# Patient Record
Sex: Female | Born: 1975 | Race: Black or African American | Hispanic: No | Marital: Married | Smoking: Never smoker
Health system: Southern US, Community
[De-identification: ages and names within clinical notes are randomized; demographics above are authoritative.]

## PROBLEM LIST (undated history)

## (undated) DIAGNOSIS — M797 Fibromyalgia: Secondary | ICD-10-CM

## (undated) DIAGNOSIS — I10 Essential (primary) hypertension: Secondary | ICD-10-CM

## (undated) DIAGNOSIS — E282 Polycystic ovarian syndrome: Secondary | ICD-10-CM

## (undated) DIAGNOSIS — T7840XA Allergy, unspecified, initial encounter: Secondary | ICD-10-CM

## (undated) DIAGNOSIS — M1990 Unspecified osteoarthritis, unspecified site: Secondary | ICD-10-CM

## (undated) DIAGNOSIS — G4730 Sleep apnea, unspecified: Secondary | ICD-10-CM

## (undated) DIAGNOSIS — F32A Depression, unspecified: Secondary | ICD-10-CM

## (undated) DIAGNOSIS — G43909 Migraine, unspecified, not intractable, without status migrainosus: Secondary | ICD-10-CM

## (undated) DIAGNOSIS — J189 Pneumonia, unspecified organism: Secondary | ICD-10-CM

## (undated) DIAGNOSIS — R0600 Dyspnea, unspecified: Secondary | ICD-10-CM

## (undated) DIAGNOSIS — I509 Heart failure, unspecified: Secondary | ICD-10-CM

## (undated) DIAGNOSIS — F319 Bipolar disorder, unspecified: Secondary | ICD-10-CM

## (undated) DIAGNOSIS — J302 Other seasonal allergic rhinitis: Secondary | ICD-10-CM

## (undated) DIAGNOSIS — M2550 Pain in unspecified joint: Secondary | ICD-10-CM

## (undated) DIAGNOSIS — R0602 Shortness of breath: Secondary | ICD-10-CM

## (undated) DIAGNOSIS — F99 Mental disorder, not otherwise specified: Secondary | ICD-10-CM

## (undated) DIAGNOSIS — R519 Headache, unspecified: Secondary | ICD-10-CM

## (undated) DIAGNOSIS — R079 Chest pain, unspecified: Secondary | ICD-10-CM

## (undated) DIAGNOSIS — R600 Localized edema: Secondary | ICD-10-CM

## (undated) DIAGNOSIS — M459 Ankylosing spondylitis of unspecified sites in spine: Secondary | ICD-10-CM

## (undated) DIAGNOSIS — I5030 Unspecified diastolic (congestive) heart failure: Secondary | ICD-10-CM

## (undated) DIAGNOSIS — N979 Female infertility, unspecified: Secondary | ICD-10-CM

## (undated) DIAGNOSIS — M549 Dorsalgia, unspecified: Secondary | ICD-10-CM

## (undated) DIAGNOSIS — K589 Irritable bowel syndrome without diarrhea: Secondary | ICD-10-CM

## (undated) DIAGNOSIS — Z8719 Personal history of other diseases of the digestive system: Secondary | ICD-10-CM

## (undated) DIAGNOSIS — E559 Vitamin D deficiency, unspecified: Secondary | ICD-10-CM

## (undated) DIAGNOSIS — Z9289 Personal history of other medical treatment: Secondary | ICD-10-CM

## (undated) DIAGNOSIS — D259 Leiomyoma of uterus, unspecified: Secondary | ICD-10-CM

## (undated) DIAGNOSIS — F329 Major depressive disorder, single episode, unspecified: Secondary | ICD-10-CM

## (undated) DIAGNOSIS — R7303 Prediabetes: Secondary | ICD-10-CM

## (undated) DIAGNOSIS — E739 Lactose intolerance, unspecified: Secondary | ICD-10-CM

## (undated) DIAGNOSIS — D649 Anemia, unspecified: Secondary | ICD-10-CM

## (undated) DIAGNOSIS — F419 Anxiety disorder, unspecified: Secondary | ICD-10-CM

## (undated) DIAGNOSIS — D219 Benign neoplasm of connective and other soft tissue, unspecified: Secondary | ICD-10-CM

## (undated) DIAGNOSIS — R51 Headache: Secondary | ICD-10-CM

## (undated) HISTORY — DX: Heart failure, unspecified: I50.9

## (undated) HISTORY — DX: Dyspnea, unspecified: R06.00

## (undated) HISTORY — DX: Unspecified osteoarthritis, unspecified site: M19.90

## (undated) HISTORY — DX: Sleep apnea, unspecified: G47.30

## (undated) HISTORY — DX: Leiomyoma of uterus, unspecified: D25.9

## (undated) HISTORY — DX: Irritable bowel syndrome, unspecified: K58.9

## (undated) HISTORY — DX: Essential (primary) hypertension: I10

## (undated) HISTORY — DX: Pain in unspecified joint: M25.50

## (undated) HISTORY — DX: Female infertility, unspecified: N97.9

## (undated) HISTORY — DX: Dorsalgia, unspecified: M54.9

## (undated) HISTORY — DX: Morbid (severe) obesity due to excess calories: E66.01

## (undated) HISTORY — DX: Allergy, unspecified, initial encounter: T78.40XA

## (undated) HISTORY — DX: Fibromyalgia: M79.7

## (undated) HISTORY — DX: Migraine, unspecified, not intractable, without status migrainosus: G43.909

## (undated) HISTORY — DX: Vitamin D deficiency, unspecified: E55.9

## (undated) HISTORY — DX: Lactose intolerance, unspecified: E73.9

## (undated) HISTORY — DX: Unspecified diastolic (congestive) heart failure: I50.30

## (undated) HISTORY — DX: Shortness of breath: R06.02

## (undated) HISTORY — DX: Ankylosing spondylitis of unspecified sites in spine: M45.9

## (undated) HISTORY — DX: Chest pain, unspecified: R07.9

## (undated) HISTORY — DX: Polycystic ovarian syndrome: E28.2

## (undated) HISTORY — DX: Bipolar disorder, unspecified: F31.9

## (undated) HISTORY — DX: Prediabetes: R73.03

## (undated) HISTORY — DX: Localized edema: R60.0

---

## 1999-08-06 HISTORY — PX: DIAGNOSTIC LAPAROSCOPY: SUR761

## 1999-08-06 HISTORY — PX: OTHER SURGICAL HISTORY: SHX169

## 2002-11-02 ENCOUNTER — Encounter: Payer: Self-pay | Admitting: Obstetrics and Gynecology

## 2002-11-02 ENCOUNTER — Ambulatory Visit (HOSPITAL_COMMUNITY): Admission: RE | Admit: 2002-11-02 | Discharge: 2002-11-02 | Payer: Self-pay | Admitting: Obstetrics and Gynecology

## 2004-02-22 ENCOUNTER — Other Ambulatory Visit: Admission: RE | Admit: 2004-02-22 | Discharge: 2004-02-22 | Payer: Self-pay | Admitting: Gynecology

## 2004-05-05 HISTORY — PX: DILATION AND CURETTAGE OF UTERUS: SHX78

## 2004-12-13 ENCOUNTER — Other Ambulatory Visit: Admission: RE | Admit: 2004-12-13 | Discharge: 2004-12-13 | Payer: Self-pay | Admitting: Gynecology

## 2005-12-11 ENCOUNTER — Other Ambulatory Visit: Admission: RE | Admit: 2005-12-11 | Discharge: 2005-12-11 | Payer: Self-pay | Admitting: Gynecology

## 2006-04-11 ENCOUNTER — Encounter: Admission: RE | Admit: 2006-04-11 | Discharge: 2006-04-11 | Payer: Self-pay | Admitting: Allergy and Immunology

## 2006-08-05 HISTORY — PX: COLONOSCOPY: SHX174

## 2010-01-20 ENCOUNTER — Emergency Department (HOSPITAL_COMMUNITY): Admission: EM | Admit: 2010-01-20 | Discharge: 2010-01-20 | Payer: Self-pay | Admitting: Emergency Medicine

## 2010-08-26 ENCOUNTER — Encounter: Payer: Self-pay | Admitting: Rheumatology

## 2010-10-21 LAB — CBC
HCT: 29.6 % — ABNORMAL LOW (ref 36.0–46.0)
Hemoglobin: 9.4 g/dL — ABNORMAL LOW (ref 12.0–15.0)
MCHC: 31.8 g/dL (ref 30.0–36.0)
MCV: 68.1 fL — ABNORMAL LOW (ref 78.0–100.0)
Platelets: 304 10*3/uL (ref 150–400)
RBC: 4.35 MIL/uL (ref 3.87–5.11)
RDW: 20.2 % — ABNORMAL HIGH (ref 11.5–15.5)
WBC: 6.8 10*3/uL (ref 4.0–10.5)

## 2010-10-21 LAB — URINALYSIS, ROUTINE W REFLEX MICROSCOPIC
Bilirubin Urine: NEGATIVE
Glucose, UA: NEGATIVE mg/dL
Hgb urine dipstick: NEGATIVE
Ketones, ur: NEGATIVE mg/dL
Nitrite: NEGATIVE
Protein, ur: NEGATIVE mg/dL
Specific Gravity, Urine: 1.024 (ref 1.005–1.030)
Urobilinogen, UA: 1 mg/dL (ref 0.0–1.0)
pH: 6.5 (ref 5.0–8.0)

## 2010-10-21 LAB — BASIC METABOLIC PANEL
BUN: 10 mg/dL (ref 6–23)
CO2: 21 mEq/L (ref 19–32)
Calcium: 8.7 mg/dL (ref 8.4–10.5)
Chloride: 109 mEq/L (ref 96–112)
Creatinine, Ser: 0.8 mg/dL (ref 0.4–1.2)
GFR calc Af Amer: >60 mL/min (ref >60)
GFR calc non Af Amer: >60 mL/min (ref >60)
Glucose, Bld: 91 mg/dL (ref 70–99)
Potassium: 3.7 mEq/L (ref 3.5–5.1)
Sodium: 136 mEq/L (ref 135–145)

## 2010-10-21 LAB — PREGNANCY, URINE: Preg Test, Ur: NEGATIVE

## 2011-07-12 ENCOUNTER — Encounter (HOSPITAL_COMMUNITY): Payer: Self-pay | Admitting: Pharmacist

## 2011-07-15 NOTE — Patient Instructions (Addendum)
   Your procedure is scheduled on: Monday, Dec 17th  Enter through the [employer] of [facility] at: 12 Noon Pick up the phone at the desk and dial [phone] and inform us of your arrival.  Please call this number if you have any problems the morning of surgery: [phone]  Remember: Do not eat food after midnight: Sunday Do not drink clear liquids after:  9:30am Monday Take these medicines the morning of surgery with a SIP OF WATER:Abilify  Do not wear jewelry, make-up, or FINGER nail polish Do not wear lotions, powders, perfumes or deodorant. Do not shave 48 hours prior to surgery. Do not bring valuables to the hospital.  Patients discharged on the day of surgery will not be allowed to drive home.    Remember to use your hibiclens as instructed.Please shower with 1/2 bottle the evening before your surgery and the other 1/2 bottle the morning of surgery.

## 2011-07-17 ENCOUNTER — Encounter (HOSPITAL_COMMUNITY): Payer: Self-pay

## 2011-07-17 ENCOUNTER — Encounter (HOSPITAL_COMMUNITY)
Admission: RE | Admit: 2011-07-17 | Discharge: 2011-07-17 | Disposition: A | Discharge status: Home / Self Care | Payer: BC Managed Care – PPO | Source: Ambulatory Visit | Attending: Obstetrics and Gynecology | Admitting: Obstetrics and Gynecology

## 2011-07-17 HISTORY — DX: Anxiety disorder, unspecified: F41.9

## 2011-07-17 HISTORY — DX: Mental disorder, not otherwise specified: F99

## 2011-07-17 HISTORY — DX: Major depressive disorder, single episode, unspecified: F32.9

## 2011-07-17 HISTORY — DX: Depression, unspecified: F32.A

## 2011-07-17 HISTORY — DX: Anemia, unspecified: D64.9

## 2011-07-17 LAB — CBC
HCT: 28.9 % — ABNORMAL LOW (ref 36.0–46.0)
Hemoglobin: 8.5 g/dL — ABNORMAL LOW (ref 12.0–15.0)
MCH: 21.3 pg — ABNORMAL LOW (ref 26.0–34.0)
MCHC: 29.4 g/dL — ABNORMAL LOW (ref 30.0–36.0)
MCV: 72.4 fL — ABNORMAL LOW (ref 78.0–100.0)
Platelets: 371 10*3/uL (ref 150–400)
RBC: 3.99 MIL/uL (ref 3.87–5.11)
RDW: 18.4 % — ABNORMAL HIGH (ref 11.5–15.5)
WBC: 8.7 10*3/uL (ref 4.0–10.5)

## 2011-07-17 NOTE — Pre-Procedure Instructions (Signed)
Patient states her most recent hgb was 8.5, which is same result today in [facility] lab at PAT appt.

## 2011-07-21 MED ORDER — GENTAMICIN SULFATE 40 MG/ML IJ SOLN
INTRAMUSCULAR | Status: AC
  Administered 2011-07-22: 100 mL via INTRAVENOUS
  Filled 2011-07-21: qty 2.5

## 2011-07-22 ENCOUNTER — Other Ambulatory Visit: Payer: Self-pay | Admitting: Obstetrics and Gynecology

## 2011-07-22 ENCOUNTER — Encounter (HOSPITAL_COMMUNITY): Payer: Self-pay | Admitting: Anesthesiology

## 2011-07-22 ENCOUNTER — Encounter (HOSPITAL_COMMUNITY)
Admission: RE | Disposition: A | Discharge status: Home / Self Care | Payer: Self-pay | Source: Ambulatory Visit | Attending: Obstetrics and Gynecology

## 2011-07-22 ENCOUNTER — Ambulatory Visit (HOSPITAL_COMMUNITY)
Admission: RE | Admit: 2011-07-22 | Discharge: 2011-07-22 | Disposition: A | Discharge status: Home / Self Care | Payer: BC Managed Care – PPO | Source: Ambulatory Visit | Attending: Obstetrics and Gynecology | Admitting: Obstetrics and Gynecology

## 2011-07-22 ENCOUNTER — Ambulatory Visit (HOSPITAL_COMMUNITY): Payer: BC Managed Care – PPO | Admitting: Anesthesiology

## 2011-07-22 DIAGNOSIS — Z01818 Encounter for other preprocedural examination: Secondary | ICD-10-CM | POA: Insufficient documentation

## 2011-07-22 DIAGNOSIS — Z01812 Encounter for preprocedural laboratory examination: Secondary | ICD-10-CM | POA: Insufficient documentation

## 2011-07-22 DIAGNOSIS — N920 Excessive and frequent menstruation with regular cycle: Secondary | ICD-10-CM | POA: Insufficient documentation

## 2011-07-22 DIAGNOSIS — N840 Polyp of corpus uteri: Secondary | ICD-10-CM

## 2011-07-22 HISTORY — PX: HYSTEROSCOPY WITH D & C: SHX1775

## 2011-07-22 LAB — PREGNANCY, URINE: Preg Test, Ur: NEGATIVE

## 2011-07-22 SURGERY — DILATATION AND CURETTAGE /HYSTEROSCOPY
Anesthesia: Monitor Anesthesia Care | Site: Uterus | Wound class: Clean Contaminated

## 2011-07-22 MED ORDER — MIDAZOLAM HCL 5 MG/5ML IJ SOLN
INTRAMUSCULAR | Status: DC | PRN
  Administered 2011-07-22: 2 mg via INTRAVENOUS

## 2011-07-22 MED ORDER — LACTATED RINGERS IV SOLN
INTRAVENOUS | Status: DC
  Administered 2011-07-22 (×2): via INTRAVENOUS

## 2011-07-22 MED ORDER — MIDAZOLAM HCL 2 MG/2ML IJ SOLN
INTRAMUSCULAR | Status: AC
  Filled 2011-07-22: qty 2

## 2011-07-22 MED ORDER — FENTANYL CITRATE 0.05 MG/ML IJ SOLN
25.0000 ug | INTRAMUSCULAR | Status: DC | PRN
  Administered 2011-07-22: 50 ug via INTRAVENOUS

## 2011-07-22 MED ORDER — PROPOFOL 10 MG/ML IV EMUL
INTRAVENOUS | Status: AC
  Filled 2011-07-22: qty 20

## 2011-07-22 MED ORDER — ONDANSETRON HCL 4 MG/2ML IJ SOLN
INTRAMUSCULAR | Status: AC
  Filled 2011-07-22: qty 2

## 2011-07-22 MED ORDER — LIDOCAINE HCL 1 % IJ SOLN
INTRAMUSCULAR | Status: DC | PRN
  Administered 2011-07-22: 10 mL

## 2011-07-22 MED ORDER — KETOROLAC TROMETHAMINE 60 MG/2ML IM SOLN
INTRAMUSCULAR | Status: DC | PRN
  Administered 2011-07-22: 30 mg via INTRAMUSCULAR

## 2011-07-22 MED ORDER — KETOROLAC TROMETHAMINE 30 MG/ML IJ SOLN: 15.0000 mg | Freq: Once | INTRAMUSCULAR | Status: DC | PRN

## 2011-07-22 MED ORDER — ONDANSETRON HCL 4 MG/2ML IJ SOLN
INTRAMUSCULAR | Status: DC | PRN
  Administered 2011-07-22: 4 mg via INTRAVENOUS

## 2011-07-22 MED ORDER — LIDOCAINE HCL (CARDIAC) 20 MG/ML IV SOLN
INTRAVENOUS | Status: DC | PRN
  Administered 2011-07-22: 50 mg via INTRAVENOUS

## 2011-07-22 MED ORDER — LIDOCAINE HCL (CARDIAC) 20 MG/ML IV SOLN
INTRAVENOUS | Status: AC
  Filled 2011-07-22: qty 5

## 2011-07-22 MED ORDER — FENTANYL CITRATE 0.05 MG/ML IJ SOLN
INTRAMUSCULAR | Status: DC | PRN
  Administered 2011-07-22: 100 ug via INTRAVENOUS

## 2011-07-22 MED ORDER — PROPOFOL 10 MG/ML IV EMUL
INTRAVENOUS | Status: DC | PRN
  Administered 2011-07-22 (×9): 40 mg via INTRAVENOUS

## 2011-07-22 MED ORDER — FENTANYL CITRATE 0.05 MG/ML IJ SOLN
INTRAMUSCULAR | Status: AC
  Filled 2011-07-22: qty 2

## 2011-07-22 MED ORDER — FENTANYL CITRATE 0.05 MG/ML IJ SOLN
INTRAMUSCULAR | Status: AC
  Administered 2011-07-22: 50 ug via INTRAVENOUS
  Filled 2011-07-22: qty 2

## 2011-07-22 SURGICAL SUPPLY — 13 items
CANISTER SUCTION 2500CC (MISCELLANEOUS) ×2 IMPLANT
CATH ROBINSON RED A/P 16FR (CATHETERS) ×2 IMPLANT
CLOTH BEACON ORANGE TIMEOUT ST (SAFETY) ×2 IMPLANT
CONTAINER PREFILL 10% NBF 60ML (FORM) ×4 IMPLANT
ELECT REM PT RETURN 9FT ADLT (ELECTROSURGICAL)
ELECTRODE REM PT RTRN 9FT ADLT (ELECTROSURGICAL) IMPLANT
GLOVE BIO SURGEON STRL SZ 6.5 (GLOVE) ×4 IMPLANT
GOWN PREVENTION PLUS LG XLONG (DISPOSABLE) ×2 IMPLANT
GOWN STRL REIN XL XLG (GOWN DISPOSABLE) ×2 IMPLANT
LOOP ANGLED CUTTING 22FR (CUTTING LOOP) IMPLANT
PACK HYSTEROSCOPY LF (CUSTOM PROCEDURE TRAY) ×2 IMPLANT
TOWEL OR 17X24 6PK STRL BLUE (TOWEL DISPOSABLE) ×4 IMPLANT
WATER STERILE IRR 1000ML POUR (IV SOLUTION) ×2 IMPLANT

## 2011-07-22 NOTE — Progress Notes (Signed)
History and physical on chart. No significant changes Will proceed with D and C and Hysteroscopy Risks already discussed with the patient.

## 2011-07-22 NOTE — Anesthesia Postprocedure Evaluation (Signed)
  Anesthesia Post-op Note  Patient: Christine [name]  Procedure(s) Performed:  DILATATION AND CURETTAGE /HYSTEROSCOPY  Patient is awake and responsive. Pain and nausea are reasonably well controlled. Vital signs are stable and clinically acceptable. Oxygen saturation is clinically acceptable. There are no apparent anesthetic complications at this time. Patient is ready for discharge.

## 2011-07-22 NOTE — Brief Op Note (Signed)
07/22/2011  2:18 PM  PATIENT:  Christine [name]  35 y.o. female  PRE-OPERATIVE DIAGNOSIS:  endometrial polyp  POST-OPERATIVE DIAGNOSIS:  same  PROCEDURE:  Procedure(s): DILATATION AND CURETTAGE /HYSTEROSCOPY Resection of Endometrial polyp  SURGEON:  Surgeon(s): Jeani Hawking, MD  PHYSICIAN ASSISTANT:   ASSISTANTS: none   ANESTHESIA:   IV sedation and paracervical block  EBL:     BLOOD ADMINISTERED:none  DRAINS: none   LOCAL MEDICATIONS USED:  LIDOCAINE 10 CC  SPECIMEN:  Source of Specimen:  uterine currettings  DISPOSITION OF SPECIMEN:  PATHOLOGY  COUNTS:  YES  TOURNIQUET:  * No tourniquets in log *  DICTATION: .Other Dictation: Dictation Number [id number]  PLAN OF CARE: Discharge to home after PACU  PATIENT DISPOSITION:  PACU - hemodynamically stable.   Delay start of Pharmacological VTE agent (>24hrs) due to surgical blood loss or risk of bleeding:  {YES/NO/NOT APPLICABLE:20182

## 2011-07-22 NOTE — Anesthesia Preprocedure Evaluation (Signed)
Anesthesia Evaluation  Patient identified by MRN, date of birth, ID band Patient awake    Reviewed: Allergy & Precautions, H&P , NPO status , Patient's Chart, lab work & pertinent test results, reviewed documented beta blocker date and time   History of Anesthesia Complications Negative for: history of anesthetic complications  Airway Mallampati: II TM Distance: >3 FB Neck ROM: full    Dental  (+) Teeth Intact   Pulmonary neg pulmonary ROS,  clear to auscultation  Pulmonary exam normal       Cardiovascular Exercise Tolerance: Good neg cardio ROS regular Normal    Neuro/Psych PSYCHIATRIC DISORDERS (bipolar disorder, anxiety) Negative Neurological ROS     GI/Hepatic negative GI ROS, Neg liver ROS,   Endo/Other  Morbid obesity  Renal/GU negative Renal ROS  Genitourinary negative   Musculoskeletal   Abdominal   Peds  Hematology  (+) Blood dyscrasia (hgb 8.5 today - relates to menstrual blood loss), anemia ,   Anesthesia Other Findings   Reproductive/Obstetrics negative OB ROS                           Anesthesia Physical Anesthesia Plan  ASA: III  Anesthesia Plan: MAC   Post-op Pain Management:    Induction:   Airway Management Planned:   Additional Equipment:   Intra-op Plan:   Post-operative Plan:   Informed Consent: I have reviewed the patients History and Physical, chart, labs and discussed the procedure including the risks, benefits and alternatives for the proposed anesthesia with the patient or authorized representative who has indicated his/her understanding and acceptance.   Dental Advisory Given  Plan Discussed with: CRNA and Surgeon  Anesthesia Plan Comments:         Anesthesia Quick Evaluation

## 2011-07-22 NOTE — H&P (Signed)
35 year old female with endometrial polyp and fibroids. This patient has been experiencing very heavy periods. A sonohysterogram in the office revealed a small endometrial polyp.  Medical history  Unremarkable  Surgical history  Unremarkable  Medications  None  Allergies PENICILLIN  Review of systems as noted above  Family history  Non contributory  Afebrile Vital signs stable General alert and oriented Lung CTAB Car RRR Abdomen is soft and non tender Ext no edema  IMPRESSION: Endometrial polyp Menorrhagia  PLAN: D and C , Hysteroscopy Risks discussed with the patient

## 2011-07-22 NOTE — Transfer of Care (Signed)
Immediate Anesthesia Transfer of Care Note  Patient: Christine [name]  Procedure(s) Performed:  DILATATION AND CURETTAGE /HYSTEROSCOPY  Patient Location: PACU  Anesthesia Type: MAC combined with regional for post-op pain  Level of Consciousness: awake, alert  and oriented  Airway & Oxygen Therapy: Patient Spontanous Breathing and Patient connected to nasal cannula oxygen  Post-op Assessment: Report given to PACU RN and Post -op Vital signs reviewed and stable  Post vital signs: Reviewed and stable  Complications: No apparent anesthesia complications

## 2011-07-23 ENCOUNTER — Encounter (HOSPITAL_COMMUNITY): Payer: Self-pay | Admitting: Obstetrics and Gynecology

## 2011-07-23 NOTE — Op Note (Signed)
Christine [name], Christine [name]             ACCOUNT NO.:  [id number]  MEDICAL RECORD NO.:  [id number]  LOCATION:  [facility]                          FACILITY:  [facility]  PHYSICIAN:  Sante Biedermann L. Knoxx Boeding, M.D.DATE OF BIRTH:  Dec 19, 1975  DATE OF PROCEDURE:  07/22/2011 DATE OF DISCHARGE:  07/22/2011                              OPERATIVE REPORT   PREOPERATIVE DIAGNOSIS:  Endometrial polyp.  POSTOPERATIVE DIAGNOSIS:  Endometrial polyp.  PROCEDURES:  D and C, hysteroscopy, and resection of endometrial polyp.  SURGEON:  Cottrell Gentles L. Loisann Roach, MD  ANESTHESIA:  IV sedation with paracervical block.  EBL:  Minimal.  Source is specimen, uterine curettings with endometrial polyp sent to pathology.  COMPLICATIONS:  None.  PROCEDURE:  The patient was taken to the operating room, sedation was performed.  She was prepped and draped in the usual sterile fashion. Speculum was inserted into the vagina.  The cervix was grasped with a tenaculum.  Paracervical block was performed in standard fashion. Hysteroscope was inserted into the uterine cavity with excellent visualization.  We saw several small polypoid areas.  The hysteroscope was removed and the sharp curette was inserted.  The uterus was thoroughly curetted of all tissue.  Polyp forceps were inserted and the remainder of the tissue was removed.  I could identify the endometrial polyp and the tissue specimen.  The hysteroscope was reinserted and the uterine cavity was cleaned.  All instruments were removed from the vagina.  All sponge, lap, and instrument counts were correct x2.  The patient went to recovery room in stable condition.     Lynnwood Beckford L. Vincente Poli, M.D.     Florestine Avers  D:  07/22/2011  T:  07/23/2011  Job:  [id number]

## 2012-06-16 ENCOUNTER — Ambulatory Visit
Admission: RE | Admit: 2012-06-16 | Discharge: 2012-06-16 | Disposition: A | Discharge status: Home / Self Care | Payer: BC Managed Care – PPO | Source: Ambulatory Visit | Attending: Physician Assistant | Admitting: Physician Assistant

## 2012-06-16 ENCOUNTER — Other Ambulatory Visit: Payer: Self-pay | Admitting: Physician Assistant

## 2012-06-16 DIAGNOSIS — R109 Unspecified abdominal pain: Secondary | ICD-10-CM

## 2012-06-16 MED ORDER — IOHEXOL 300 MG/ML  SOLN
125.0000 mL | Freq: Once | INTRAMUSCULAR | Status: AC | PRN
  Administered 2012-06-16: 125 mL via INTRAVENOUS

## 2012-06-16 MED ORDER — IOHEXOL 300 MG/ML  SOLN
40.0000 mL | Freq: Once | INTRAMUSCULAR | Status: AC | PRN
  Administered 2012-06-16: 40 mL via ORAL

## 2014-01-15 ENCOUNTER — Encounter (HOSPITAL_COMMUNITY): Payer: Self-pay | Admitting: Emergency Medicine

## 2014-01-15 ENCOUNTER — Emergency Department (HOSPITAL_COMMUNITY)
Admission: EM | Admit: 2014-01-15 | Discharge: 2014-01-15 | Disposition: A | Discharge status: Home / Self Care | Payer: BC Managed Care – PPO | Attending: Emergency Medicine | Admitting: Emergency Medicine

## 2014-01-15 DIAGNOSIS — Z862 Personal history of diseases of the blood and blood-forming organs and certain disorders involving the immune mechanism: Secondary | ICD-10-CM | POA: Insufficient documentation

## 2014-01-15 DIAGNOSIS — Z3202 Encounter for pregnancy test, result negative: Secondary | ICD-10-CM | POA: Insufficient documentation

## 2014-01-15 DIAGNOSIS — E669 Obesity, unspecified: Secondary | ICD-10-CM | POA: Insufficient documentation

## 2014-01-15 DIAGNOSIS — N949 Unspecified condition associated with female genital organs and menstrual cycle: Secondary | ICD-10-CM | POA: Insufficient documentation

## 2014-01-15 DIAGNOSIS — F411 Generalized anxiety disorder: Secondary | ICD-10-CM | POA: Insufficient documentation

## 2014-01-15 DIAGNOSIS — R109 Unspecified abdominal pain: Secondary | ICD-10-CM | POA: Insufficient documentation

## 2014-01-15 DIAGNOSIS — Z880 Allergy status to penicillin: Secondary | ICD-10-CM | POA: Insufficient documentation

## 2014-01-15 DIAGNOSIS — N938 Other specified abnormal uterine and vaginal bleeding: Secondary | ICD-10-CM

## 2014-01-15 DIAGNOSIS — F329 Major depressive disorder, single episode, unspecified: Secondary | ICD-10-CM | POA: Insufficient documentation

## 2014-01-15 DIAGNOSIS — Z8742 Personal history of other diseases of the female genital tract: Secondary | ICD-10-CM | POA: Insufficient documentation

## 2014-01-15 DIAGNOSIS — Z791 Long term (current) use of non-steroidal anti-inflammatories (NSAID): Secondary | ICD-10-CM | POA: Insufficient documentation

## 2014-01-15 DIAGNOSIS — Z79899 Other long term (current) drug therapy: Secondary | ICD-10-CM | POA: Insufficient documentation

## 2014-01-15 DIAGNOSIS — F3289 Other specified depressive episodes: Secondary | ICD-10-CM | POA: Insufficient documentation

## 2014-01-15 DIAGNOSIS — N925 Other specified irregular menstruation: Secondary | ICD-10-CM | POA: Insufficient documentation

## 2014-01-15 DIAGNOSIS — Z9889 Other specified postprocedural states: Secondary | ICD-10-CM | POA: Insufficient documentation

## 2014-01-15 HISTORY — DX: Benign neoplasm of connective and other soft tissue, unspecified: D21.9

## 2014-01-15 LAB — BASIC METABOLIC PANEL
BUN: 14 mg/dL (ref 6–23)
CO2: 23 mEq/L (ref 19–32)
Calcium: 9.2 mg/dL (ref 8.4–10.5)
Chloride: 108 mEq/L (ref 96–112)
Creatinine, Ser: 0.75 mg/dL (ref 0.50–1.10)
GFR calc Af Amer: >90 mL/min (ref >90)
GFR calc non Af Amer: >90 mL/min (ref >90)
Glucose, Bld: 109 mg/dL — ABNORMAL HIGH (ref 70–99)
Potassium: 4.4 mEq/L (ref 3.7–5.3)
Sodium: 141 mEq/L (ref 137–147)

## 2014-01-15 LAB — CBC
HCT: 32.3 % — ABNORMAL LOW (ref 36.0–46.0)
Hemoglobin: 10.4 g/dL — ABNORMAL LOW (ref 12.0–15.0)
MCH: 29.1 pg (ref 26.0–34.0)
MCHC: 32.2 g/dL (ref 30.0–36.0)
MCV: 90.5 fL (ref 78.0–100.0)
Platelets: 225 10*3/uL (ref 150–400)
RBC: 3.57 MIL/uL — ABNORMAL LOW (ref 3.87–5.11)
RDW: 14.5 % (ref 11.5–15.5)
WBC: 7.1 10*3/uL (ref 4.0–10.5)

## 2014-01-15 LAB — WET PREP, GENITAL
Clue Cells Wet Prep HPF POC: NONE SEEN
Trich, Wet Prep: NONE SEEN
WBC, Wet Prep HPF POC: NONE SEEN
Yeast Wet Prep HPF POC: NONE SEEN

## 2014-01-15 LAB — POC URINE PREG, ED: Preg Test, Ur: NEGATIVE

## 2014-01-15 MED ORDER — FERROUS SULFATE 325 (65 FE) MG PO TABS: 325.0000 mg | ORAL_TABLET | Freq: Two times a day (BID) | ORAL | Status: DC

## 2014-01-15 MED ORDER — NORETHINDRONE ACET-ETHINYL EST 1-20 MG-MCG PO TABS: 1.0000 | ORAL_TABLET | Freq: Two times a day (BID) | ORAL | Status: DC

## 2014-01-15 NOTE — ED Provider Notes (Signed)
Medical screening examination/treatment/procedure(s) were performed by non-physician practitioner and as supervising physician I was immediately available for consultation/collaboration.   EKG Interpretation None       Jasper Riling. Alvino Chapel, MD 01/15/14 1601

## 2014-01-15 NOTE — ED Notes (Signed)
C/o vaginal bleeding since 4pm yesterday with mild abd cramping.  Reports LMP- irregular.  History of fibroids.

## 2014-01-15 NOTE — Discharge Instructions (Signed)
Please read and follow all provided instructions.  Your diagnoses today include:  1. Dysfunctional uterine bleeding     Tests performed today include:  Blood counts and electrolytes -- hemoglobin is slightly low  Blood tests to check kidney function  Urine test to look for pregnancy  Vital signs. See below for your results today.   Medications prescribed:   Microgestin - birth control medication to control bleeding   Iron tablets - to treat anemia  Take any prescribed medications only as directed.  Home care instructions:   Follow any educational materials contained in this packet.  Follow-up instructions: Please follow-up with your gynecologist in the next 2 days for further evaluation of your symptoms.   Return instructions:  SEEK IMMEDIATE MEDICAL ATTENTION IF:  You pass out or feel severely lightheaded or short of breath  A temperature above 101F develops   Repeated vomiting occurs (multiple episodes)   Blood is being passed in stools or vomit (bright red or black tarry stools)   You develop chest pain, difficulty breathing, dizziness or fainting, or become confused, poorly responsive, or inconsolable (young children)  If you have any other emergent concerns regarding your health   Your vital signs today were: BP 155/90   Pulse 89   Temp(Src) 98.8 F (37.1 C) (Oral)   Resp 30   SpO2 99% If your blood pressure (bp) was elevated above 135/85 this visit, please have this repeated by your doctor within one month. --------------

## 2014-01-15 NOTE — ED Notes (Signed)
Geiple, PA at bedside.  

## 2014-01-15 NOTE — ED Provider Notes (Signed)
CSN: [id number]     Arrival date & time 01/15/14  0520 History   First MD Initiated Contact with Patient 01/15/14 [phone]     Chief Complaint  Patient presents with  . Vaginal Bleeding     (Consider location/radiation/quality/duration/timing/severity/associated sxs/prior Treatment) HPI Comments: Patient with history of PCOS, right salpingo-oophorectomy, vaginal bleeding and associated anemia -- presents with complaint of worsening vaginal bleeding since yesterday afternoon. Patient has had only mild abdominal cramping in her right lower abdomen which does not radiate. Patient called on-call nurse for her gynecologist and was told to come to the emergency department. Patient notes at times she has been going through a heavy overnight pad in approximately 15 minutes. She has felt lightheaded but has not passed out. No other fever, nausea, vomiting, or diarrhea. No dysuria. Patient has irregular menstrual periods. Patient had an IUD in place to help bleeding until about 2 months ago. She does not think this really controlled her symptoms. GYN is Dr. Carren Rang. No treatments prior to arrival. The onset of this condition was acute. The course is constant. Aggravating factors: none. Alleviating factors: none.    The history is provided by the patient.    Past Medical History  Diagnosis Date  . Anemia   . Mental disorder   . Anxiety   . Depression   . Fibroids    Past Surgical History  Procedure Laterality Date  . Diagnostic laparoscopy    . Dilation and curettage of uterus    . Hysteroscopy w/d&c  07/22/2011    Procedure: DILATATION AND CURETTAGE /HYSTEROSCOPY;  Surgeon: Cyril Mourning, MD;  Location: [facility];  Service: Gynecology;;   No family history on file. History  Substance Use Topics  . Smoking status: Never Smoker   . Smokeless tobacco: Not on file  . Alcohol Use: No   OB History   Grav Para Term Preterm Abortions TAB SAB Ect Mult Living                 Review of Systems   Constitutional: Negative for fever.  HENT: Negative for rhinorrhea and sore throat.   Eyes: Negative for redness.  Respiratory: Negative for cough.   Cardiovascular: Negative for chest pain.  Gastrointestinal: Positive for abdominal pain (Mild cramping). Negative for nausea, vomiting and diarrhea.  Genitourinary: Positive for vaginal bleeding. Negative for dysuria.  Musculoskeletal: Negative for myalgias.  Skin: Negative for rash.  Neurological: Negative for headaches.      Allergies  Penicillins  Home Medications   Prior to Admission medications   Medication Sig Start Date End Date Taking? Authorizing Provider  ARIPiprazole (ABILIFY) 10 MG tablet Take 10 mg by mouth daily.     Yes Historical Provider, MD  clonazePAM (KLONOPIN) 1 MG tablet Take 1 mg by mouth at bedtime.     Yes Historical Provider, MD  lamoTRIgine (LAMICTAL) 200 MG tablet Take 200 mg by mouth at bedtime.     Yes Historical Provider, MD  naproxen sodium (ALEVE) 220 MG tablet Take 440 mg by mouth 2 (two) times daily as needed. For pain   Yes Historical Provider, MD   BP 145/94  Pulse 87  Temp(Src) 98.8 F (37.1 C) (Oral)  Resp 20  SpO2 96% Physical Exam  Nursing note and vitals reviewed. Constitutional: She appears well-developed and well-nourished.  HENT:  Head: Normocephalic and atraumatic.  Eyes: Conjunctivae are normal. Right eye exhibits no discharge. Left eye exhibits no discharge.  Neck: Normal range of motion. Neck supple.  Cardiovascular: Normal rate, regular rhythm and normal heart sounds.   Pulmonary/Chest: Effort normal and breath sounds normal.  Abdominal: Soft. Bowel sounds are normal. She exhibits no distension. There is no tenderness. There is no rebound and no guarding.  Obese habitus  Genitourinary: Cervix exhibits discharge (blood). Right adnexum displays no mass and no tenderness. Left adnexum displays no mass and no tenderness. There is bleeding (heavy bleeding, clots) around the  vagina. No tenderness around the vagina.  Neurological: She is alert.  Skin: Skin is warm and dry.  Psychiatric: She has a normal mood and affect.    ED Course  Procedures (including critical care time) Labs Review Labs Reviewed  CBC - Abnormal; Notable for the following:    RBC 3.57 (*)    Hemoglobin 10.4 (*)    HCT 32.3 (*)    All other components within normal limits  BASIC METABOLIC PANEL - Abnormal; Notable for the following:    Glucose, Bld 109 (*)    All other components within normal limits  WET PREP, GENITAL  POC URINE PREG, ED    Imaging Review No results found.   EKG Interpretation None      7:06 AM Patient seen and examined. Work-up initiated.    Vital signs reviewed and are as follows: Filed Vitals:   01/15/14 0645  BP: 148/89  Pulse: 89  Temp:   Resp: 26   8:50 AM Pelvic performed with NT chaperone. Will call patient's GYN for instructions.   9:36 AM spoke with on-call for Dr. Carren Rang.   Recommend Microgestin bid-tid to control bleeding acutely. Patient also also be on iron. She is to call the office in 2 days for a recheck, possible discussion of hysterectomy or other measures to control bleeding.  Ensure that the patient does not have a history of blood clots or contraindications for this therapy. She does not smoke.  Patient urged to return with worsening heavy bleeding, if she passes out, if she feels short of breath, or has other concerns. Patient verbalizes understanding and agrees with plan.  BP 159/91  Pulse 91  Temp(Src) 98.8 F (37.1 C) (Oral)  Resp 19  SpO2 100%   MDM   Final diagnoses:  Dysfunctional uterine bleeding   Patient with heavy vaginal bleeding. Mild anemia in the emergency department. Objectively she does not appear pale and she has normal conjunctiva. No tachycardia or shortness of breath. No indication for blood transfusion. She is not pregnant she has no significant lower abdominal pain.  Arrangements made to start  patient on estrogen progesterone combination therapy to control bleeding in conjunction with patient's gynecologist group. Appropriate return instructions discussed with patient.     Carlisle Cater, PA-C 01/15/14 [phone]

## 2015-02-01 DIAGNOSIS — N96 Recurrent pregnancy loss: Secondary | ICD-10-CM | POA: Insufficient documentation

## 2015-02-01 DIAGNOSIS — E559 Vitamin D deficiency, unspecified: Secondary | ICD-10-CM | POA: Insufficient documentation

## 2015-02-21 DIAGNOSIS — Z90721 Acquired absence of ovaries, unilateral: Secondary | ICD-10-CM | POA: Insufficient documentation

## 2015-04-06 HISTORY — PX: DILATION AND CURETTAGE OF UTERUS: SHX78

## 2015-04-07 DIAGNOSIS — O3429 Maternal care due to uterine scar from other previous surgery: Secondary | ICD-10-CM | POA: Insufficient documentation

## 2017-02-12 ENCOUNTER — Encounter (HOSPITAL_COMMUNITY): Payer: Self-pay | Admitting: Psychiatry

## 2017-02-12 ENCOUNTER — Ambulatory Visit (INDEPENDENT_AMBULATORY_CARE_PROVIDER_SITE_OTHER): Payer: BC Managed Care – PPO | Admitting: Psychiatry

## 2017-02-12 VITALS — BP 140/82 | Ht 65.0 in | Wt 333.0 lb

## 2017-02-12 DIAGNOSIS — F3162 Bipolar disorder, current episode mixed, moderate: Secondary | ICD-10-CM

## 2017-02-12 DIAGNOSIS — Z818 Family history of other mental and behavioral disorders: Secondary | ICD-10-CM

## 2017-02-12 DIAGNOSIS — G4700 Insomnia, unspecified: Secondary | ICD-10-CM | POA: Diagnosis not present

## 2017-02-12 MED ORDER — OXCARBAZEPINE 150 MG PO TABS: 450.0000 mg | ORAL_TABLET | Freq: Two times a day (BID) | ORAL | 0 refills | Status: DC

## 2017-02-12 MED ORDER — LAMOTRIGINE 150 MG PO TABS: 300.0000 mg | ORAL_TABLET | Freq: Every day | ORAL | 0 refills | Status: DC

## 2017-02-12 MED ORDER — LURASIDONE HCL 20 MG PO TABS: ORAL_TABLET | ORAL | 0 refills | Status: DC
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
## 2017-03-06 ENCOUNTER — Encounter (HOSPITAL_COMMUNITY): Payer: Self-pay | Admitting: Licensed Clinical Social Worker

## 2017-03-06 ENCOUNTER — Ambulatory Visit (INDEPENDENT_AMBULATORY_CARE_PROVIDER_SITE_OTHER): Payer: BC Managed Care – PPO | Admitting: Licensed Clinical Social Worker

## 2017-03-06 DIAGNOSIS — F3162 Bipolar disorder, current episode mixed, moderate: Secondary | ICD-10-CM | POA: Diagnosis not present
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
## 2017-03-10 ENCOUNTER — Encounter (HOSPITAL_COMMUNITY): Payer: Self-pay | Admitting: Psychiatry

## 2017-03-10 ENCOUNTER — Other Ambulatory Visit (HOSPITAL_COMMUNITY): Payer: BC Managed Care – PPO | Attending: Psychiatry | Admitting: Psychiatry

## 2017-03-10 DIAGNOSIS — Z915 Personal history of self-harm: Secondary | ICD-10-CM | POA: Insufficient documentation

## 2017-03-10 DIAGNOSIS — I10 Essential (primary) hypertension: Secondary | ICD-10-CM | POA: Insufficient documentation

## 2017-03-10 DIAGNOSIS — Z818 Family history of other mental and behavioral disorders: Secondary | ICD-10-CM | POA: Diagnosis not present

## 2017-03-10 DIAGNOSIS — Z79899 Other long term (current) drug therapy: Secondary | ICD-10-CM | POA: Insufficient documentation

## 2017-03-10 DIAGNOSIS — Z7984 Long term (current) use of oral hypoglycemic drugs: Secondary | ICD-10-CM | POA: Insufficient documentation

## 2017-03-10 DIAGNOSIS — Z880 Allergy status to penicillin: Secondary | ICD-10-CM | POA: Insufficient documentation

## 2017-03-10 DIAGNOSIS — F3162 Bipolar disorder, current episode mixed, moderate: Secondary | ICD-10-CM

## 2017-03-10 DIAGNOSIS — E282 Polycystic ovarian syndrome: Secondary | ICD-10-CM | POA: Diagnosis not present
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
## 2017-03-10 NOTE — Progress Notes (Signed)
    Daily Group Progress Note  Program: IOP  Group Time: 9:00-12:00  Participation Level: Active  Behavioral Response: Appropriate  Type of Therapy:  Group Therapy  Summary of Progress: Pt.'s first day in group. Pt. Presents as talkative, energetic, provides thoughtful feedback to other group members. Pt. Introduced herself to the group and met with the case manager to complete the intake assessment. Pt. Shared her journey of misdiagnosis with depression and finding correct diagnosis with bipolar disorder. Pt. Shared that once she was properly diagnosed, it was difficult for her to accept the diagnosis and spent several years not taking her medication or seeking treatment. Pt. Discussed generally feeling stressed and overwhelmed due to recent move back to [facility] and parenting 77 and 12 year old daughters. Pt. Participated in discussion about use of the grounding series for anxiety and use of cognitive modeling to develop awareness of unintentional thoughts and how they contribute to our feelings.      Nancie Neas, LPC

## 2017-03-11 ENCOUNTER — Other Ambulatory Visit (HOSPITAL_COMMUNITY): Payer: BC Managed Care – PPO | Admitting: Psychiatry

## 2017-03-11 DIAGNOSIS — F3162 Bipolar disorder, current episode mixed, moderate: Secondary | ICD-10-CM | POA: Diagnosis not present
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
## 2017-03-12 ENCOUNTER — Other Ambulatory Visit (HOSPITAL_COMMUNITY): Payer: BC Managed Care – PPO | Admitting: Psychiatry

## 2017-03-12 ENCOUNTER — Ambulatory Visit (HOSPITAL_COMMUNITY): Payer: BC Managed Care – PPO | Admitting: Psychiatry

## 2017-03-12 DIAGNOSIS — F3162 Bipolar disorder, current episode mixed, moderate: Secondary | ICD-10-CM

## 2017-03-12 MED ORDER — LURASIDONE HCL 60 MG PO TABS
ORAL_TABLET | ORAL | 0 refills | Status: DC
Start: 2017-03-12 — End: 2017-03-25

## 2017-03-12 NOTE — Progress Notes (Signed)
Patient ID: Christine [name], female   DOB: 09/05/1975, 41 y.o.   MRN: [id number]  Increased Latuda to 60 mg at dinnertime to help with racing thoughts, irritability and mood swings as well as depression.  Will add gabapentin in a few days if still needed for anxiety

## 2017-03-12 NOTE — Progress Notes (Signed)
    Daily Group Progress Note  Program: IOP  Group Time: 9:00-12:00  Participation Level: Active  Behavioral Response: Appropriate  Type of Therapy:  Group Therapy  Summary of Progress: Pt. Presents as alert, engaged in the group process, primarily flat affect, responsive to feedback from counselor and other members of the group. Pt. Stated that she continues to be challenged by acceptance of her mental health diagnosis and that knowing that she has a disorder that she will have to manage for the rest of her life makes her feel sad. Pt. Participated in discussion self-care and activities that affirm thoughts of worthiness. Pt. Discussed her interests in baking and reading, but sadness that she has not had the interest to engage in these interests for a long time. Pt. Participated in grief and loss session with the Chaplain.     Nancie Neas, LPC

## 2017-03-13 ENCOUNTER — Telehealth (HOSPITAL_COMMUNITY): Payer: Self-pay | Admitting: Psychiatry

## 2017-03-13 ENCOUNTER — Other Ambulatory Visit (HOSPITAL_COMMUNITY): Payer: BC Managed Care – PPO

## 2017-03-14 ENCOUNTER — Other Ambulatory Visit (HOSPITAL_COMMUNITY): Payer: BC Managed Care – PPO | Admitting: Psychiatry

## 2017-03-14 DIAGNOSIS — F3162 Bipolar disorder, current episode mixed, moderate: Secondary | ICD-10-CM | POA: Diagnosis not present

## 2017-03-14 NOTE — Progress Notes (Signed)
    Daily Group Progress Note  Program: IOP  Group Time: 9:00-12:00  Participation Level: Active  Behavioral Response: Appropriate  Type of Therapy:  Group Therapy  Summary of Progress: Pt. Presents with primarily flat affect, indicates some anxiety and discomfort with the group process. Pt. Discussed her history of difficulty opening up to others, challenged by vulnerability. Pt. Discussed that when she tries to open up to her friends about her mental health and what she has gone through in the past few years that she often cries which makes them feel uncomfortable. Pt. Was able to process with the group getting more comfortable with sharing with others and not finding her sadness and tears shameful.     Nancie Neas, LPC

## 2017-03-14 NOTE — Progress Notes (Signed)
    Daily Group Progress Note  Program: IOP  Group Time: 9:00-12:00  Participation Level: Active  Behavioral Response: Appropriate  Type of Therapy:  Group Therapy  Summary of Progress: Pt. Presents as talkative, smiles appropriately, engaged in the group process. Pt. discuss patterns of judgment about herself, introverted personality, and not connecting with friends due to judgemental thoughts about herself. Pt. Participated in discussion about reducing feelings of judgment about self, resisting patterns of all-or-nothing thinking.       Nancie Neas, LPC

## 2017-03-17 ENCOUNTER — Other Ambulatory Visit (HOSPITAL_COMMUNITY): Payer: BC Managed Care – PPO | Admitting: Psychiatry

## 2017-03-17 DIAGNOSIS — F3162 Bipolar disorder, current episode mixed, moderate: Secondary | ICD-10-CM | POA: Diagnosis not present

## 2017-03-17 MED ORDER — CLONAZEPAM 1 MG PO TABS: 1.0000 mg | ORAL_TABLET | Freq: Three times a day (TID) | ORAL | 0 refills | Status: DC

## 2017-03-17 NOTE — Progress Notes (Signed)
Christine [name] is a 41 y.o. , married, unemployed, African American female, who was referred per therapist ([place]); treatment for worsening Bipolar D/O (mixed) symptoms.  In addition to seeing Binnie Rail, LCAS, pt also has seen [name] once.  Multiple stressors:  1)  In June 2018 pt and family moved from [facility] back to [place]. She lived in [place] for 3 years due to job placement. 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   States the family moved back in order for her to effective mental health care.  States she was dx'd with Bipolar ten yrs ago.  Had been driving to [facility] for medication mgmt and therapy.  She was hospitalized 3 x (most recent admit was Nov. 2017) at a psychiatrist hospital during her 3 year stint in [place].  Denies any suicide attempts, but admits to hx of self-injurious behavior (cutting).  Recent behavior was ~ 4-5 yrs ago.  2) Strained finances.  Husband of 44 yrs, is working on a business out of the home.  He apparently was laid off from previous job last summer.   She is married with 2 children (9&10).  Pt also has low self esteem and feels she has lost her identity which was tied to her job.  3)  Conflictual relationship with mother.  Pt states she is one of the reasons for the move.  "My mom doesn't understand my illness.  She'll provide financially, because she wants to feel needed; but she's not emotionally available.  Family Hx:  Mother (Depression and Anxiety)    Pt arrived this a.m distraught, stating that her weekend was awful.  C/O increased anxiety with panic attacks, vague SI (no plan or intent), poor concentration, low energy, isolation, poor appetite, ruminating thoughts, sadness and tearfulness.  According to pt, she feels that she needs more coping skill groups.  Pt met with Dr. Lovena Le as requested.  Since Dr. Lovena Le is making medication changes and pt feels she  needs more intensive groups; Dr. Lovena Le suggests that pt be transitioned to [facility] for stabilization.  A:  Transition pt to [facility] tomorrow.  D/C pt from MH-IOP today.  R:  Pt receptive.     Carlis Abbott, RITA, M.Ed, CNA

## 2017-03-17 NOTE — Progress Notes (Signed)
   Patient ID: Christine [name], female   DOB: [birth year], 41 y.o.   MRN: [id number]   Progress note Christine [name] is getting no help from the [name] that she can recognize.  With the 60 mg dose she feels more anxious.  The hypomania is less but the anxiety is much worse and she cannot tell what is racing thoughts from the mania ar just the anxiety.  She cannot sleep and is afraid of needing to be admitted again and really does not want to be in the hospital again.  She is feeling hopeless and cannot find any way to relax so the depression is worsening.  She will require more intensive medication management than can be done in the IOP in order to keep her out of the hospital.  Consequently I plan to transfer her to [facility] to manage medications more closely.  The more general IOP group is not as useful as the PHP which will focus more individually on the coping skills specific to her bipolar disorder and anxiety disorder.  Plan:  Transfer to PHP  Add clonazepam 1 mg tid to see if the anxiety can be controlled better and then reconsider the use of Latuda.  Seroquel helped in the past but Abilify made her worse she says.  So will then try Seroquel again to see if it can improve sleep and reduce the hypomania and depression.continue Trileptal at the current dose and Lamictal at its current dose

## 2017-03-17 NOTE — Progress Notes (Signed)
    Daily Group Progress Note  Program: IOP  Group Time: 9:00-12:00  Participation Level: Active  Behavioral Response: Appropriate  Type of Therapy:  Group Therapy  Summary of Progress: Pt. Participated in medication management group with the pharmacist and indicated that it was very helpful for her. Pt. Presented as very tearful, depressed. Pt. Stated that her weekend was not good because she was not able to get out of the house and do any activities with her daughters. Pt. Continues to be strongly critical of herself and challenged to exercise self-compassion and acceptance concepts that we are working on in group. Pt. Presented as very tearful and expressed concern that she is not getting better. Pt. Requested to meet with Dr. Lovena Le and made decision to go into partial hospitalization program.     Nancie Neas, [facility]

## 2017-03-18 ENCOUNTER — Other Ambulatory Visit (HOSPITAL_COMMUNITY): Payer: BC Managed Care – PPO

## 2017-03-18 ENCOUNTER — Other Ambulatory Visit (HOSPITAL_COMMUNITY): Payer: BC Managed Care – PPO | Admitting: Occupational Therapy

## 2017-03-18 ENCOUNTER — Other Ambulatory Visit (HOSPITAL_COMMUNITY): Payer: BC Managed Care – PPO | Admitting: Licensed Clinical Social Worker

## 2017-03-18 DIAGNOSIS — F3162 Bipolar disorder, current episode mixed, moderate: Secondary | ICD-10-CM

## 2017-03-18 NOTE — Psych (Signed)
   [facility] PHP THERAPIST PROGRESS NOTE  Meka Lewan [id number]  Session Time: 9-2  Participation Level: Active  Behavioral Response: CasualAlertDepressed  Type of Therapy: Group Therapy, Psychotherapy, Occupational Therapy, Psychoeducation  Treatment Goals addressed: Coping  Interventions: CBT, DBT, Supportive and Reframing  Summary: 9:00 - 10:30 Clinician led check-in regarding current stressors and situation, and review of patient completed daily inventory. Clinician utilized active listening and empathetic response and validated patient emotions. Clinician facilitated processing group on pertinent issues.  10:30 -11:30: OT Group  11:30 - 12:00: Clinician led psychoeducation group on healthy ways to use distraction.  12:00-12:45: Reflection group: Patients encouraged to practice skills and interpersonal techniques or work on mindfulness and relaxation techniques. The importance of self-care and making skills part of a routine to increase usage were stressed.  12:45 - 1:50 Clinician introduced topic of "Radical Acceptance." Group discussed how and when radical acceptance can be used and helpful. 1:50 - 2:00 Clinician led check-out. Clinician assessed for immediate needs, medication compliance and efficacy, and safety concerns.    Suicidal/Homicidal: Nowithout intent/plan  Therapist Response: Christine [name] is a 41 y.o. female who presents with depression symptoms. Patient arrived within time allowed and reports she is feeling "a little better than yesterday." Patient rates her mood at a 4 on a 1- 10 scale with 10 being great. Pt reports she feels "far from stable, but functional."  Pt fears if she doesn't get more intensive help, she'll end up back in the hospital.  Pt states she "lost it" yesterday and was very emotional with crying spells, and reports passive SI has returned. Pt reports feeling guilt about not meeting her own expectations for herself.  Patient engaged in activity  and discussion. Patient acknowledged that radical acceptance is hard, but that she needs to employ it.  Pt stated she needs to radically accept the limitations of her diagnosis.  Patient demonstrates some progress as evidenced by sharing and being active in group on her first day. Patient denies SI/HI/self-harm thoughts at end of session.  Plan: Patient will continue in PHP and medication management while working on decreasing depression and anxiety symptoms and increasing distress tolerance and emotional regulation skills.  Diagnosis: Bipolar disorder, current episode mixed, moderate ([place]) [F31.62]    1. Bipolar disorder, current episode mixed, moderate ([place])     Ahnesty Finfrock J Shalandria Elsbernd, LPCA 03/18/2017

## 2017-03-18 NOTE — Therapy (Signed)
[place], [place], [address] Phone: [phone]   Fax:  [phone]  Occupational Therapy Evaluation  Patient Details  Name: Jewelene Mairena MRN: [id number] Date of Birth: 03/13/1976 Referring Provider: Dr. [name]  Encounter Date: 03/18/2017      OT End of Session - 03/18/17 1700    Visit Number 1   Number of Visits 6   Date for OT Re-Evaluation 04/08/17   Authorization Type BCBS   OT Start Time 1030   OT Stop Time 1140   OT Time Calculation (min) 70 min   Activity Tolerance Patient tolerated treatment well   Behavior During Therapy [facility] for tasks assessed/performed      Past Medical History:  Diagnosis Date  . Anemia   . Anxiety   . Bipolar 1 disorder ([place])   . Depression   . Fibroids   . High blood pressure   . Mental disorder   . PCOS (polycystic ovarian syndrome)     Past Surgical History:  Procedure Laterality Date  . DIAGNOSTIC LAPAROSCOPY    . DILATION AND CURETTAGE OF UTERUS    . HYSTEROSCOPY W/D&C  07/22/2011   Procedure: DILATATION AND CURETTAGE /HYSTEROSCOPY;  Surgeon: Cyril Mourning, MD;  Location: [facility];  Service: Gynecology;;    There were no vitals filed for this visit.      Subjective Assessment - 03/18/17 1700    Currently in Pain? No/denies           [facility] OT Assessment - 03/18/17 1700      Assessment   Diagnosis Bipolar I, mixed moderate   Referring Provider Dr. [name]   Onset Date --  chronic     Precautions   Precautions None     Restrictions   Weight Bearing Restrictions No     Balance Screen   Has the patient fallen in the past 6 months No   Has the patient had a decrease in activity level because of a fear of falling?  No   Is the patient reluctant to leave their home because of a fear of falling?  No     Prior Function   Level of Independence Independent       OT assessment Diagnosis: Bipolar I,  mixed/moderate Past medical history: n/a  Living situation: With spouse and 2 children ADLs: independent Work: trying to work from home Leisure: Firefighter, Control and instrumentation engineer Social support: husband, family Struggles: coping strategies, little things bother her OT goal: improve coping strategies.  General Causality Orientation Scale    Subscore Percentile Score  Autonomy 60 71.67  Control 49 55.95  Impersonal 57 80.88    Motivation Type  Motivation type Explanation    Impersonal/amotivational  There is a lack of connection between any of the individual's behavior and his or her personal goals. The individual is likely in a passivity state or manifests non-goal-directed behavior. This is considered a type of motivational deficit and warrants motivational interventions.        Assessment:  Patient demonstrates impersonal/amotivational behaviors. This is considered a type of motivational deficit and warrants motivational interventions. Patient will benefit from occupational therapy intervention in order to improve time management, financial management, stress management, job readiness skills, social skills, sleep hygiene, exercise and healthy eating habits, and health management skills and other psychosocial skills needed for preparation to return to full time community living and to be a productive community member.     Plan:  Patient  will participate in skilled occupational therapy sessions individually or in a group setting to improve coping skills, psychosocial skills, and emotional skills required to return to prior level of function as a productive community member. Treatment will be 1-2 times per week for 2-6 weeks.      OT Group: Stress Management S:  "I bake when I'm stressed." O:  Patient actively participated in the following skilled occupational therapy treatment session this date.  Stress management-discussed what stress is and how it affects one's life. Pt completed the How Stressed am  I? worksheet with a high stress level score of 6.  Discussed her personal stress symptoms and her current coping mechanisms or ways of dealing with stress. Pt engaged in identifying causes of stress which were work, and daily things that she feels like she has to get done. Pt actively participated in [place] activity-identifying types of music as relaxing, fun, or as having no effect on her mood. She engaged in discussion of the effects of stress on health, both physical and mental health, including acute and chronic stress. Pt completed ladder stress hierarchy worksheet and participated in brainstorming stress management techniques to employ including relaxation techniques, emotional regulation, exercise, music, and nutrition.  A:  Patient participated in skilled occupational therapy group for stress management skills this date.  Patient was engaged and open to strategies introduced.  P:  Continue participation in skilled occupational therapy groups  1-2 times per week for 2 weeks in order to gain the necessary skills needed to return to full time community living and learn effective coping strategies to be a productive community resident. Follow up on HEP for finding a stress management technique to begin incorporating into a daily routine, targeting at least one stressor listed on hierarchy worksheet.                      OT Short Term Goals - 03/18/17 1701      OT SHORT TERM GOAL #1   Title Patient will be educated on strategies to improve psychosocial skills needed to participate fully in all daily, work, and leisure activities.   Time 3   Period Weeks   Status New   Target Date 04/08/17     OT SHORT TERM GOAL #2   Title Patient will be educated on a HEP and independent with implementation of HEP.   Time 3   Period Weeks   Status New     OT SHORT TERM GOAL #3   Title Patient will independently apply psychosocial skills and coping mechanisms to her daily activities  in order to function independently.   Time 3   Period Weeks   Status New                  Plan - 03/18/17 1701    Occupational performance deficits (Please refer to evaluation for details): ADL's;IADL's;Rest and Sleep;Education;Work;Leisure;Social Participation   Rehab Potential Good   OT Frequency 2x / week   OT Duration --  3 weeks   OT Treatment/Interventions Self-care/ADL training  community reintegration, coping skills training, psychosocial skills training   Clinical Decision Making Limited treatment options, no task modification necessary   Consulted and Agree with Plan of Care Patient      Patient will benefit from skilled therapeutic intervention in order to improve the following deficits and impairments:   (decreased coping skills, decreased psychosocial skills)  Visit Diagnosis: Bipolar disorder, current episode mixed, moderate ([place])  Problem List There are no active problems to display for this patient.  Guadelupe Sabin, OTR/L  [phone] 03/18/2017, [place], [place], [address] Phone: [phone]   Fax:  [phone]  Name: Lus Kriegel MRN: [id number] Date of Birth: September 29, 1975

## 2017-03-19 ENCOUNTER — Ambulatory Visit (HOSPITAL_COMMUNITY): Payer: Self-pay | Admitting: Licensed Clinical Social Worker

## 2017-03-19 ENCOUNTER — Other Ambulatory Visit (HOSPITAL_COMMUNITY): Payer: BC Managed Care – PPO

## 2017-03-19 ENCOUNTER — Encounter (HOSPITAL_COMMUNITY): Payer: Self-pay | Admitting: Psychiatry

## 2017-03-19 ENCOUNTER — Other Ambulatory Visit (HOSPITAL_COMMUNITY): Payer: BC Managed Care – PPO | Admitting: Licensed Clinical Social Worker

## 2017-03-19 VITALS — BP 150/90 | HR 97 | Ht 63.75 in | Wt 339.0 lb

## 2017-03-19 DIAGNOSIS — F3162 Bipolar disorder, current episode mixed, moderate: Secondary | ICD-10-CM

## 2017-03-19 MED ORDER — OXCARBAZEPINE 600 MG PO TABS: 600.0000 mg | ORAL_TABLET | Freq: Two times a day (BID) | ORAL | 1 refills | Status: DC

## 2017-03-19 NOTE — Progress Notes (Signed)
Progress note  Christine [name] was transferred to Partial hospital program yesterday because she was not improving in the IOP and medications needed to be managed more intensively than could be done in IOP.  The concern was that she might continue deteriorating and need inpatient again which she very much wants to avoid    She cut the Latuda in half last night and will discontinue it today.  It seems to make the anxiety and hypomania worse.  The clonazepam helps with the anxiety but not the sleep.  The oxcarbazepine was the most helpful over the years and she wants to try increasing that next rather than trying another atypical which have never worked as well as expected.     She is less hypomanic today than initially  And less anxious but still does not feel right.  Says it is a mixture of being depressed and agitated with the agitation being the worse.      Plan:  Discontinue Latuda.  Increase oxcarbazepine to 600 mg bid.  Continue clonazepam up to 1 mg tid as needed ( she takes less than that and it does help the anxiety).  We talked about Seroquel and Lithium both of which she has tried in the past

## 2017-03-19 NOTE — Progress Notes (Signed)
Patient presented with appropriate affect, some noted elevated mood but reports she is feeling better than previously with hypomanic symptoms.  States she did not sleep well the previous night only about 5 hours and was up by 3am so she admits to feeling tired some currently.  Reviewed patients physical and psychiatric history as she denies ever attempting to harm self or others but often has suicidal ideations. Patient denies any suicidal or homicidal ideations at this time and discussed changes to medications Dr. Lovena Le made today as patient felt Latuda was initially helpful but then was causing elevation in mood.  States she thinks increase in Trileptal will be helpful and although atypical medications such as Seroquel have not been that effective for mood in the past have been helpful with sleep.  Patient's BP elevated some today 150/90 and will continue to monitor as she admitted she was not sure she took her BP medication today.  Patient to check back in with this nurse on 03/20/17 to inform if sleep improved for tonight and is happy she is now attending PHP as feels she the increased structure and coping skills being taught are effective.  Patient scored her current level of depression a 4, anxiety a 7, and hopelessness a 6 on a scale of 0-10 with 0 being none and 10 the worst she could experience.  Patient stable at this time and denies any auditory or visual hallucinations, no current suicidal or homicidal ideations and no plans to harm self or others.  Patient agreed to keep [facility] staff informed if any symptoms increased or any problems occurred with medications changes.

## 2017-03-20 ENCOUNTER — Other Ambulatory Visit (HOSPITAL_COMMUNITY): Payer: BC Managed Care – PPO | Admitting: Specialist

## 2017-03-20 ENCOUNTER — Other Ambulatory Visit (HOSPITAL_COMMUNITY): Payer: BC Managed Care – PPO | Admitting: Licensed Clinical Social Worker

## 2017-03-20 ENCOUNTER — Other Ambulatory Visit (HOSPITAL_COMMUNITY): Payer: BC Managed Care – PPO

## 2017-03-20 DIAGNOSIS — F3162 Bipolar disorder, current episode mixed, moderate: Secondary | ICD-10-CM

## 2017-03-20 NOTE — Progress Notes (Signed)
03/19/2017 10:30-12:00.  Pt attended spirituality group facilitated by chaplain Jerene Pitch, MDiv   Spirituality groups focused on topic of "hope."   Pts responded to topic and, in facilitated dialog, expressed how they feel about topic and where they see this in their life.  Pts then engaged in visual explorer activity, choosing pictures that represent what they wish hope to mean for them and sharing these through facilitated dialog.   [name] was present throughout group.  Alert and engaged with facilitator and other group members.  Spoke about hope being difficult to find over last few years.  Identified searching for hope in moving to [facility] from [facility] yet realizing that hope is not something that will come "all at once" but rather over time.   [name] became tearful when another group member expressed affirmation and told [name] she looked up to her.  Group member described [name] as being able to accomplish things in her life such as taking care of children even though she was experiencing depression.  [name] described having marked her life with external affirmation - in school and work - and trying to find places to free herself of these expectations and remember what is important to her.  [name] values being an adopted mother and the dedication she and her spouse show to her children.   She chose pictures that had to do with travel and shared with group that she used to travel often with her family.  Is hopeful to find ways to engage her curiosity even without leaving state.    WL / [place], MDiv

## 2017-03-20 NOTE — Therapy (Signed)
[place], [place], [address] Phone: [phone]   Fax:  [phone]  Occupational Therapy Treatment  Patient Details  Name: Christine [name] MRN: [id number] Date of Birth: 1976-04-28 Referring Provider: Dr. [name]  Encounter Date: 03/20/2017      OT End of Session - 03/20/17 1410    Visit Number 2   Number of Visits 6   Date for OT Re-Evaluation 04/08/17   Authorization Type BCBS   OT Start Time 1030   OT Stop Time 1130   OT Time Calculation (min) 60 min   Activity Tolerance Patient tolerated treatment well   Behavior During Therapy [facility] for tasks assessed/performed      Past Medical History:  Diagnosis Date  . Anemia   . Anxiety   . Bipolar 1 disorder ([place])   . Depression   . Fibroids   . High blood pressure   . Mental disorder   . PCOS (polycystic ovarian syndrome)     Past Surgical History:  Procedure Laterality Date  . DIAGNOSTIC LAPAROSCOPY    . DILATION AND CURETTAGE OF UTERUS    . HYSTEROSCOPY W/D&C  07/22/2011   Procedure: DILATATION AND CURETTAGE /HYSTEROSCOPY;  Surgeon: Cyril Mourning, MD;  Location: [facility];  Service: Gynecology;;    There were no vitals filed for this visit.      Subjective Assessment - 03/20/17 1410    Currently in Pain? No/denies        S:  I realize I am inflicting unrealistic deadlines on myself.   O:  patient participated in skilled OT group focusing on the relationship between an individual's mental and physical health.  Patient was educated and discussed the definitions of health, well-being, mental health, depression.  Patient was educated and discussed the importance of emotional wellbeing.  Correlation between physical and mental health was discussed.  Patient educated on strategies to implement to improve both physical and mental health:  healthy diet, sufficient sleep, exercise, setting realistic goals, having support system.    A:  Patient was engaged throughout session.  Patient able to identify several areas of life that she is good at, and felt it was easier to identify areas that she excelled in versus struggled with.  Patient is going to focus on taking time for herself by going to water aerobics 3 times per week. .   P:  Continue skilled OT group 2 times per week for 3 weeks.  Next session follow up on what strategy was implemented to improve physical and mental health.  Group focus will be learning healthy time management strategies.                        OT Education - 03/20/17 1410    Education provided Yes   Education Details educated on cohesion of physical and mental health   Person(s) Educated Patient   Methods Explanation;Handout   Comprehension Verbalized understanding          OT Short Term Goals - 03/20/17 1411      OT SHORT TERM GOAL #1   Title Patient will be educated on strategies to improve psychosocial skills needed to participate fully in all daily, work, and leisure activities.   Time 3   Period Weeks   Status On-going     OT SHORT TERM GOAL #2   Title Patient will be educated on a HEP and independent with implementation of HEP.  Time 3   Period Weeks   Status On-going     OT SHORT TERM GOAL #3   Title Patient will independently apply psychosocial skills and coping mechanisms to her daily activities in order to function independently.   Time 3   Period Weeks   Status On-going                Patient will benefit from skilled therapeutic intervention in order to improve the following deficits and impairments:   (decreased coping skills, decreased psychosocial skills)  Visit Diagnosis: Bipolar 1 disorder, mixed, moderate (H[facility])  Bipolar disorder, current episode mixed, moderate ([place])    Problem List There are no active problems to display for this patient.   Vangie Bicker, [place], OTR/L [phone]  03/20/2017, 2:11 PM  [place], [place], [address] Phone: [phone]   Fax:  [phone]  Name: Christine [name] MRN: [id number] Date of Birth: 06-30-1976

## 2017-03-20 NOTE — Psych (Signed)
   [facility] PHP THERAPIST PROGRESS NOTE  Christine [name] [id number]  Session Time: 9-2  Participation Level: Active  Behavioral Response: CasualAlertDepressed  Type of Therapy: Group Therapy, Psychotherapy, Spiritual Care, Psychoeducation  Treatment Goals addressed: Coping  Interventions: CBT, DBT, Supportive and Reframing  Summary: 9:00 - 10:30 Clinician led check-in regarding current stressors and situation, and review of patient completed daily inventory. Clinician utilized active listening and empathetic response and validated patient emotions. Clinician facilitated processing group on pertinent issues.  10:30 -11:30: OT Group  11:30 - 12:00: Clinician led psychoeducation group on healthy ways to use distraction.  12:00-12:45: Reflection group: Patients encouraged to practice skills and interpersonal techniques or work on mindfulness and relaxation techniques. The importance of self-care and making skills part of a routine to increase usage were stressed.  12:45 - 1:50 Clinician introduced topic of "Radical Acceptance." Group discussed how and when radical acceptance can be used and helpful. 1:50 - 2:00 Clinician led check-out. Clinician assessed for immediate needs, medication compliance and efficacy, and safety concerns.    Suicidal/Homicidal: Nowithout intent/plan  Therapist Response: Christine [name] is a 41 y.o. female who presents with depression symptoms. Patient arrived within time allowed and reports she is feeling "anxious." Patient rates her mood at a 5 on a 1- 10 scale with 10 being great. Pt reports she struggles with having the BiP[place] diagnosis and the stigma related to it.  Pt states her mother often tells her that she needs to "just get over it" when problems with her mental illness arise.  Pt states she is optimistic about having a "surface" relationship with her mother now that she has moved away, but has radically accepted that her mother will never be an emotionally  supportive person.  Patient engaged in activity and discussion. Pt states she has trouble with a lot of the cognitive distortions discussed, but wants to focus on "should statements" to start. Patient demonstrates some progress as evidenced by increased insight into thinking patterns that affect her self-esteem. Patient denies SI/HI/self-harm thoughts at end of session.  Plan: Patient will continue in PHP and medication management while working on decreasing manic, depression, and anxiety symptoms and increasing distress tolerance and emotional regulation skills.  Diagnosis: Bipolar 1 disorder, mixed, moderate (HCC) [F31.62]    1. Bipolar 1 disorder, mixed, moderate ([place])     Christine [name] J [name], LPCA 03/20/2017

## 2017-03-20 NOTE — Progress Notes (Signed)
03/19/2017 10:30-12:00.   Pt attended spirituality group facilitated by chaplain Jerene Pitch, MDiv  Spirituality groups focused on topic of "hope." Pts responded to topic and, in facilitated dialog, expressed how they feel about topic and where they see this in their life. Pts then engaged in visual explorer activity, choosing pictures that represent what they wish hope to mean for them and sharing these through facilitated dialog.  Christine [name] was present throughout group. Exhibited flat affect and appeared distracted or withdrawn. Minimally engaged when prompted by facilitator and spoke with soft voice. He chose a picture during the exercise, but when group inquired about this, he stated "I just picked one up."   [facility] Chaplain Jerene Pitch, MDiv

## 2017-03-21 ENCOUNTER — Other Ambulatory Visit (HOSPITAL_COMMUNITY): Payer: BC Managed Care – PPO | Admitting: Licensed Clinical Social Worker

## 2017-03-21 ENCOUNTER — Other Ambulatory Visit (HOSPITAL_COMMUNITY): Payer: BC Managed Care – PPO

## 2017-03-21 DIAGNOSIS — F3162 Bipolar disorder, current episode mixed, moderate: Secondary | ICD-10-CM | POA: Diagnosis not present

## 2017-03-21 NOTE — Progress Notes (Signed)
Met with patient today as she presented with more appropriate affect, depressed mood but acknowledged no suicidal or homicidal ideations and "doing okay" with the recent trileptal increase.  Patient reported still being tired a lot and sleeping hygiene as poor and not on any set schedule but also discussed her status with history of anemia and going now more than 3 months since moving without an iron infusion.  Patient agreed to follow up with new hematologist to come up with a plan to pay her $85 copayment that will be due for first evaluation since she moved to a new provider in [place] over the next week.  Patient in agreement some of her feelings of fatigue may be associated with her diagnosed anemia and she denied any current problems with medications.  Patient reported the partial hospitalization program has been very helpful with learning coping skills and rated her level of depression a 7, anxiety a 3 and hopelessness a 5 on a scale of 0-10 with 0 being none and 10 the worst she could experience today.  Patient admitted she was more hopeful since going to PHP this week and states she now understands she needs to make sure her potential physical health issues are addressed to feel better just as her psychiatric issues.  Patient stable today with no auditory or visual hallucinations, no current suicidal or homicidal ideations and no current problems with medications or treatment.  Patient to return in the coming week for PHP.

## 2017-03-21 NOTE — Psych (Signed)
   [facility] PHP THERAPIST PROGRESS NOTE  Christine [name] [id number]  Session Time: 9-2  Participation Level: Active  Behavioral Response: CasualAlertEuthymic  Type of Therapy: Group Therapy, Psychotherapy, Occupational Therapy, Psychoeducation  Treatment Goals addressed: Coping  Interventions: CBT, DBT, Supportive and Reframing  Summary:  9:00 - 10:30 Clinician led check-in regarding current stressors and situation, and review of patient completed daily inventory. Clinician utilized active listening and empathetic response and validated patient emotions. Clinician facilitated processing group on pertinent issues.  10:30 - 11:30 OT Group  11:30 - 12:00: Clinician led psychoeducation group about self-soothing and how/when patients can employ self-sooth techniques to help.  12:00 - 12:45 Reflection group: Patients encouraged to practice skills and interpersonal techniques or work on mindfulness and relaxation techniques. The importance of self-care and making skills part of a routine to increase usage were stressed.  12:45 - 1:50 Clinician led feelings [place] game to aid in identification of feelings. Patients discussed how hard emotions and feelings can be to identify, accept, and handle. Patients identified times when they felt certain feelings and how it manifested for them.  1:50 - 2:00 Clinician led check-out. Clinician assessed for immediate needs, medication compliance and efficacy, and safety concerns.   Suicidal/Homicidal: Nowithout intent/plan  Therapist Response: Christine [name] is a 41 y.o. female who presents with depression symptoms. Patient arrived within time allowed and reports she is feeling "a little better than yesterday." Patient rates her mood at a 6 on a 1- 10 scale with 10 being great. Pt discussed feeling "betrayed" by Christine [name] because it has worked so well for other people, but not for her.  Pt discussed how her disjointed treatment has made it difficult to find  medications that work and stick with it.  Pt discussed how difficult it is to not compare herself to others.  Pt also stated she often puts herself last and is trying to change that thinking.  Pt was able to write down ideas for stress relieve the previous evening.  Pt shared she started knitting to help reduce anxiety.   Patient engaged in activity and discussion. Pt discussed her difficulty with the feeling of being "submissive" and how uncomfortable it makes her feel.  Pt also discussed feeling "liberated" when moving back to Christine [name].  Patient demonstrates some progress as evidenced by using skills taught in group, outside of group. Patient denies SI/HI/self-harm thoughts at end of session.  Plan: Patient will continue in PHP and medication management while working on decreasing manic, depression, and anxiety symptoms and increasing distress tolerance and emotional regulation skills.  Diagnosis: Bipolar disorder, current episode mixed, moderate ([place]) [F31.62]    1. Bipolar disorder, current episode mixed, moderate ([place])     Jay Haskew J Ryenn Howeth, LPCA 03/21/2017

## 2017-03-24 ENCOUNTER — Other Ambulatory Visit (HOSPITAL_COMMUNITY): Payer: BC Managed Care – PPO

## 2017-03-24 ENCOUNTER — Other Ambulatory Visit (HOSPITAL_COMMUNITY): Payer: Self-pay

## 2017-03-24 ENCOUNTER — Other Ambulatory Visit (HOSPITAL_COMMUNITY): Payer: BC Managed Care – PPO | Admitting: Licensed Clinical Social Worker

## 2017-03-24 DIAGNOSIS — F3162 Bipolar disorder, current episode mixed, moderate: Secondary | ICD-10-CM | POA: Diagnosis not present

## 2017-03-24 MED ORDER — LAMOTRIGINE 150 MG PO TABS: 300.0000 mg | ORAL_TABLET | Freq: Every day | ORAL | 0 refills | Status: DC

## 2017-03-24 NOTE — Psych (Signed)
Individual as component of PHP  Cln met with pt for individual as component of PHP. Cln discussed with patient accepting the limitations she currently has due to her mental state. Cln encouraged pt to remember that this is for the current moment and that these limitations might not exist next week, month, or year. Cln reminded pt of radical acceptance tenets. 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 Pt encouraged pt to reframe her husband's efforts from "helping" to "doing his share." Pt states she continues to struggle with accepting the limits in her life due to her mental health struggles. Pt is able to recognize that this may not be permanent and that establishing a medication regimen and regular therapy treatment may help her return to a previous level of functioning. 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 Pt shared her struggles such as over-working and feeling as if she has to do it all. Pt reports willingness to try setting specific times to work, leaving the house to work so as to separate home and work, and giving her electronics to her husband at bed time so she can not continue to work.

## 2017-03-24 NOTE — Psych (Signed)
   [facility] PHP THERAPIST PROGRESS NOTE  Christine [name] [id number]  Session Time: 9-2  Participation Level: Active  Behavioral Response: CasualAlertEuthymic  Type of Therapy: Group Therapy, Psychotherapy, Psychoeducation, Activity Therapy  Treatment Goals addressed: Coping  Interventions: CBT, DBT, Supportive and Reframing  Summary:  9:00 - 10:30 Clinician led check-in regarding current stressors and situation, and review of patient completed daily inventory. Clinician utilized active listening and empathetic response and validated patient emotions. Clinician facilitated processing group on pertinent issues.  10:30 -12:00: Clinician introduced topic of "Distress Tolerance". Clinician discussed "STOP" and "TIPS", and how/when patients can employ these methods to help. Patients identified when these techniques may be helpful in their personal lives.  12:00 - 12:45: Reflection group: Patients encouraged to practice skills and interpersonal techniques or work on mindfulness and relaxation techniques. The importance of self-care and making skills part of a routine to increase usage were stressed.  12:45- 1:50 Clinician continued topic of "Distress Tolerance". Clinician discussed "ACCEPTS" and how/when patients can employ this method to help. Patients identified specific actions and when this technique may be helpful in their personal lives. Patients created "Top 5" lists to use when needed.  1:50 - 2:00 Clinician led check-out. Clinician assessed for immediate needs, medication compliance and efficacy, and safety concerns.    Suicidal/Homicidal: Nowithout intent/plan  Therapist Response: Christine [name] is a 41 y.o. female who presents with depression symptoms. Patient arrived within time allowed and reports she is feeling "neutral." Patient rates her mood at a 5 on a 1- 10 scale with 10 being great. Pt discussed her "low key" weekend.  Pt engaged in self-care activities such as knitting and  listening to classical music.  Pt reports setting limits on work time.  Pt reports struggling with sleep issues.  Pt reports wanting to work on routine, which will help with sleep.  Pt discussed continued issues with acceptance on limitations and diagnoses.  Pt reports low energy levels over the weekend.  Pt stated she ran out of Lamictal Sunday morning.  Prescription was provided before pt left.  Patient engaged in activity and discussion. Pt responded to thoughts (counting and puzzles) and activities. Patient demonstrates some progress as evidenced by increased insight. Patient denies SI/HI/self-harm thoughts at end of session.  Plan: Patient will continue in PHP and medication management while working on decreasing manic, depression, and anxiety symptoms and increasing distress tolerance and emotional regulation skills.  Diagnosis: Bipolar disorder, current episode mixed, moderate ([place]) [F31.62]    1. Bipolar disorder, current episode mixed, moderate ([place])     Helina Hullum J Maliq Pilley, LPCA 03/24/2017

## 2017-03-24 NOTE — Psych (Signed)
   [facility] PHP THERAPIST PROGRESS NOTE  Christine [name] [id number]  Session Time: 9-1  Participation Level: Active  Behavioral Response: CasualAlertEuthymic  Type of Therapy: Group Therapy, Psychotherapy,  Psychoeducation, Individual  Treatment Goals addressed: Coping  Interventions: CBT, DBT, Supportive and Reframing  Summary:  9:00 - 10:30 Clinician led check-in regarding current stressors and situation, and review of patient completed daily inventory. Clinician utilized active listening and empathetic response and validated patient emotions. Clinician facilitated processing group on pertinent issues.  10:30 - 11:20: Individual Session  11:20 - 11:45:Nurse  11:45 - 12:00: Clinician led psychoeducation group about the three parts of the mind (emotional, logical, wise). Group discussed how the use of "and" instead of "but" can help [place] use wise mind.  12:00 - 12:45: Group watched "How to Practice Emotional First-Aid" TedTalk. Group discussed how it is difficult to connect emotional and physical health, at times and how we do not give ourselves as much grace as we give others.  12:50 - 1:00 Clinician led check-out. Clinician assessed for immediate needs, medication compliance and efficacy, and safety concerns.    Suicidal/Homicidal: Nowithout intent/plan  Therapist Response: [name] is a 41 y.o. female who presents with depression symptoms. Patient arrived within time allowed and reports she is feeling "not well physically." Patient rates her mood at a 3 on a 1- 10 scale with 10 being great. Pt shared struggling financially and how that plays into her mental health and feeling as if she is "surviving, not living." Patient recognizes that thinking about her gratitudes helps counter her struggles with this. Patient engaged in activity and discussion. Patient demonstrates some progress as evidenced by recognizing limits to maintain regulation. Patient denies SI/HI/self-harm thoughts at  end of session.  Plan: Patient will continue in PHP and medication management while working on decreasing manic, depression, and anxiety symptoms and increasing distress tolerance and emotional regulation skills.  Diagnosis: Bipolar disorder, current episode mixed, moderate ([place]) [F31.62]    1. Bipolar disorder, current episode mixed, moderate ([place])     [name], LCSW 03/24/2017

## 2017-03-25 ENCOUNTER — Other Ambulatory Visit (HOSPITAL_COMMUNITY): Payer: BC Managed Care – PPO | Admitting: Occupational Therapy

## 2017-03-25 ENCOUNTER — Other Ambulatory Visit (HOSPITAL_COMMUNITY): Payer: BC Managed Care – PPO

## 2017-03-25 ENCOUNTER — Other Ambulatory Visit (HOSPITAL_COMMUNITY): Payer: BC Managed Care – PPO | Admitting: Licensed Clinical Social Worker

## 2017-03-25 DIAGNOSIS — F3162 Bipolar disorder, current episode mixed, moderate: Secondary | ICD-10-CM

## 2017-03-25 NOTE — Progress Notes (Signed)
Progress note     Christine [name] is doing better overall.  Complained of impulsivity over the weekend but also was not able to get her lamotrigine filled so it is hard to judge whether that was lack of medication, persistent hypomania or just a personality trait.  She feels better without the Latuda.  She takes the clonazepam 1 mg at bed time and it does help the racing thoughts and anxiety.  She is sleeping okay currently.        Plan is to leave medications as they are.  She seems better but is uneasy that this really is better based on the ups and downs she has been through recently.

## 2017-03-25 NOTE — Psych (Signed)
   [facility] PHP THERAPIST PROGRESS NOTE  Chrisy Hillebrand [id number]  Session Time: 9-2  Participation Level: Active  Behavioral Response: CasualAlertAnxious and Depressed  Type of Therapy: Group Therapy, Psychotherapy, Occupational Therapy, Psychoeducation, Art Therapy  Treatment Goals addressed: Coping  Interventions: CBT, DBT, Supportive and Reframing  Summary:  9:00 - 10:30 Clinician led check-in regarding current stressors and situation, and review of patient completed daily inventory. Clinician utilized active listening and empathetic response and validated patient emotions. Clinician facilitated processing group on pertinent issues.  10:30 - 11:30: OT  11:30 - 12:15: Clinician continued with Time Management topic. Group watched "How to Gain Control of Your Free Time" TedTalk. Group discussed importance of routine and scheduling self-care time. Group discussed using the "I don't have time" excuse to not take care of self. Group discussed "time wasters" and ways to find more time for self-care.  12:15 - 1:00: Reflection group: Patients encouraged to practice skills and interpersonal techniques or work on mindfulness and relaxation techniques. The importance of self-care and making skills part of a routine to increase usage were stressed.  1:00 - 1:50: Art Therapy: Group created "Self-Care Collages" to remind self of things that can be done to practice and enjoy self-care.  1:50 - 2:00 Clinician led check-out. Clinician assessed for immediate needs, medication compliance and efficacy, and safety concerns.   Suicidal/Homicidal: Nowithout intent/plan  Therapist Response: Helon Wisinski is a 41 y.o. female who presents with manic and depression symptoms. Patient arrived within time allowed and reports she is feeling "anxious." Patient rates her mood at a 4 on a 1- 10 scale with 10 being great. Pt discussed struggling with leaving group the previous day because she realized she did not have  anything specific to do. Pt stated she felt very impulsive last night and wanted to leave her family. Pt states she does not want a "permanent break" and did not have thoughts of hurting herself, but she wants a break from her responsibilities. Pt discussed how her husband is emotionally supportive but has never been financially and how this causes stress for her. Cln discussed taking a self-vacation to visit best friend in [place]. Pt loved the idea and stated she will get on planning the trip after group. Pt discussed not being able to pick up her prescription the previous evening, and the link between not having the medication and the impulsive thoughts. Pt states she is "pissed off" about needing medication to not act impulsively. Pt cooked, wrote, and knitted to distract herself from the impulsive thoughts. Patient engaged in activity and discussion. Pt reports she needs to work on routine and scheduling will help her with routine and time management. Pt states she needs to schedule self-care time in order to actually practice it and not feel selfish for taking time for herself.  Patient demonstrates some progress by the use of skills in a heightened situation. Patient denies SI/HI/self-harm thoughts at end of session.  Plan: Patient will continue in PHP and medication management while working on decreasing manic, depression, and anxiety symptoms and increasing distress tolerance and emotional regulation skills.  Diagnosis: Bipolar 1 disorder, mixed, moderate ([facility]) [F31.62]    1. Bipolar 1 disorder, mixed, moderate ([place])     Lyda Colcord J Tayler Heiden, LPCA 03/25/2017

## 2017-03-25 NOTE — Therapy (Signed)
[name], [place], [address] Phone: [phone]   Fax:  [phone]  Occupational Therapy Treatment  Patient Details  Name: Christine [name] MRN: [id number] Date of Birth: 02-24-1976 Referring Provider: Dr. [name]  Encounter Date: 03/25/2017      OT End of Session - 03/25/17 1335    Visit Number 3   Number of Visits 6   Date for OT Re-Evaluation 04/08/17   Authorization Type BCBS   OT Start Time 1030   OT Stop Time 1135   OT Time Calculation (min) 65 min   Activity Tolerance Patient tolerated treatment well   Behavior During Therapy [facility] for tasks assessed/performed      Past Medical History:  Diagnosis Date  . Anemia   . Anxiety   . Bipolar 1 disorder ([place])   . Depression   . Fibroids   . High blood pressure   . Mental disorder   . PCOS (polycystic ovarian syndrome)     Past Surgical History:  Procedure Laterality Date  . DIAGNOSTIC LAPAROSCOPY    . DILATION AND CURETTAGE OF UTERUS    . HYSTEROSCOPY W/D&C  07/22/2011   Procedure: DILATATION AND CURETTAGE /HYSTEROSCOPY;  Surgeon: Cyril Mourning, MD;  Location: [facility];  Service: Gynecology;;    There were no vitals filed for this visit.      Subjective Assessment - 03/25/17 1335    Currently in Pain? No/denies            [facility] OT Assessment - 03/25/17 1335      Assessment   Diagnosis Bipolar I, mixed moderate     Precautions   Precautions None      OT Session: Time Management    S: "I can plan the big things like outings or vacations, it's the little daily things that I struggle with."  O: Time management session completed with emphasis on skills required, effective versus ineffective time management, importance of structure, along with tips and strategies for success. Patient provided with education on the effects of time management in regards to improving physical, emotional, and social well-being  including improved ability for focus, decision-making skills, success in school, work, and social commitments, as well as benefits of reduced stress and the experience of greater success in all aspects of daily life.   A: Pt participated in time management occupational therapy treatment session this date within the [facility] program. Pt actively engaged in discussion on 10 tips for time management. Pt completed worksheets looking at her daily schedule, time wasters, and mine for time. Pt completed problem solving on how to incorporate time for herself as well as planning and working towards small goals versus large tasks that are harder to sustain. At end of session pt identified strategies for improving current time management including using a planner book, sticky notes, or google planner app on her phone.   P: Pt provided with time management strategies to implement during her daily schedule to assist in improving her balance between self-care, school, leisure, and PHP program tasks. OT will follow up with pt on success or struggles with implementation of learned strategies in 1 week.           OT Short Term Goals - 03/20/17 1411      OT SHORT TERM GOAL #1   Title Patient will be educated on strategies to improve psychosocial skills needed to participate fully in all daily, work, and leisure activities.  Time 3   Period Weeks   Status On-going     OT SHORT TERM GOAL #2   Title Patient will be educated on a HEP and independent with implementation of HEP.   Time 3   Period Weeks   Status On-going     OT SHORT TERM GOAL #3   Title Patient will independently apply psychosocial skills and coping mechanisms to her daily activities in order to function independently.   Time 3   Period Weeks   Status On-going                  Plan - 03/25/17 1335    Rehab Potential Good   OT Frequency 2x / week   OT Duration --  3 weeks   OT Treatment/Interventions Self-care/ADL training   community reintegration, coping skills training, psychosocial skills training   Consulted and Agree with Plan of Care Patient      Patient will benefit from skilled therapeutic intervention in order to improve the following deficits and impairments:   (decreased coping skills, decreased psychosocial skills)  Visit Diagnosis: Bipolar disorder, current episode mixed, moderate ([place])    Problem List There are no active problems to display for this patient.  Christine [name], OTR/L  [phone]  03/25/2017, 1:35 PM  [facility] HOSPITALIZATION PROGRAM [place], [place], [address] Phone: [phone]   Fax:  [phone]  Name: Christine [name] MRN: [id number] Date of Birth: [birth year]

## 2017-03-26 ENCOUNTER — Other Ambulatory Visit (HOSPITAL_COMMUNITY): Payer: BC Managed Care – PPO

## 2017-03-26 ENCOUNTER — Other Ambulatory Visit (HOSPITAL_COMMUNITY): Payer: BC Managed Care – PPO | Admitting: Licensed Clinical Social Worker

## 2017-03-26 DIAGNOSIS — F3162 Bipolar disorder, current episode mixed, moderate: Secondary | ICD-10-CM

## 2017-03-27 ENCOUNTER — Other Ambulatory Visit (HOSPITAL_COMMUNITY): Payer: BC Managed Care – PPO | Admitting: Specialist

## 2017-03-27 ENCOUNTER — Other Ambulatory Visit (HOSPITAL_COMMUNITY): Payer: BC Managed Care – PPO | Admitting: Licensed Clinical Social Worker

## 2017-03-27 ENCOUNTER — Encounter (HOSPITAL_COMMUNITY): Payer: Self-pay

## 2017-03-27 ENCOUNTER — Other Ambulatory Visit (HOSPITAL_COMMUNITY): Payer: BC Managed Care – PPO

## 2017-03-27 DIAGNOSIS — F3162 Bipolar disorder, current episode mixed, moderate: Secondary | ICD-10-CM

## 2017-03-27 NOTE — Psych (Signed)
   [facility] PHP THERAPIST PROGRESS NOTE  Cele Mote [id number]  Session Time: 9-2  Participation Level: Active  Behavioral Response: CasualAlertAnxious and Depressed  Type of Therapy: Group Therapy, Psychotherapy, Spiritual Care, Activity Therapy, Individual Therapy  Treatment Goals addressed: Coping  Interventions: CBT, DBT, Supportive and Reframing  Summary:  9:00 - 9:45 Clinician led check-in regarding current stressors and situation, and review of patient completed daily inventory. Clinician utilized active listening and empathetic response and validated patient emotions. Clinician facilitated processing group on pertinent issues.  9:45-10:30: Individual Session 10:30 -12:00 Spiritual care group  12:00 - 12:45 Reflection group: Patients encouraged to practice skills and interpersonal techniques or work on mindfulness and relaxation techniques. The importance of self-care and making skills part of a routine to increase usage were stressed.  12:45 - 1:50 Clinician led Coping Skill flashcard activity.  1:50 - 2:00 Clinician led check-out. Clinician assessed for immediate needs, medication compliance and efficacy, and safety concerns.     Suicidal/Homicidal: Nowithout intent/plan  Therapist Response: Junell Cullifer is a 41 y.o. female who presents with manic and depression symptoms. Patient arrived within time allowed and reports she is feeling "rough." Patient rates her mood at a 5 on a 1- 10 scale with 10 being great. Pt reports she cut herself the previous night, but did not feel suicidal.  Pt met with cln one-on-one to safety plan (see individual note). Pt reports not wanting to come to group, but she made herself.  Pt states she has been on her period for 21 days now, which isn't unusual for her, but feels it may be playing a role in recent impulsive thoughts/"slump".  Patient engaged in activity and discussion. Pt created coping skills flash cards, and struggled with the  positive affirmation cards.  Group discussed how we may not believe the positive affirmations when we write them, but if we continue to read them to ourselves, we may start to believe them.  After this discussion, pt was able to create more positive affirmation cards than asked to do by Cln.  Patient demonstrates some progress by an increase in mood rating to 7 at the end of group. Patient denies SI/HI/self-harm thoughts at end of session.  Plan: Patient will continue in PHP and medication management while working on decreasing manic, depression, and anxiety symptoms, as well as thoughts of self-harm, and increasing distress tolerance and emotional regulation skills.  Diagnosis: Bipolar disorder, current episode mixed, moderate ([place]) [F31.62]    1. Bipolar disorder, current episode mixed, moderate ([place])     Breanne Olvera J Yaritzy Huser, LPCA 03/27/2017

## 2017-03-27 NOTE — Psych (Signed)
Individual 9:45-10:30 - 03/26/17  Pt reports she "cut some last night but not suicidal."  Pt states she did not try other skills instead of cutting because she could not think of anything else to do.  Pt reports "thoughtful cutting" as to not leave any permanent marks.  Pt states she does not want anything permanent to happen because she rationally knows she needs to be here for her daughters and husband.  Pt discussed feeling a lot of pressure due to financial issues.  Cln and pt discussed how pt's impulsive thoughts could be related to her not taking her medication for a few days.  Pt discussed continuing to struggle with using medication to be "normal" and needing it to not have impulsive thoughts/actions, as well as struggling with acceptance of limitations of diagnosis.  Cln discussed how the diagnosis is only a piece of her puzzle, not all of it.  Cln discussed with pt how the mental illness does not take away the other parts of her, such as her intelligence and caring nature.  Pt reports continued struggle to separate these things, and giving Bi-Polar/Mania credit for the good parts of her (ie: successful with school) and the negative things she experiences (ie: impulsivity).  Cln and pt created a safety plan for pt to use when she does feel the desire to hurt herself.  Pt identified warning signs, such as clenching of fists/teeth and the feeling of needing to escape, coping skills (distraction: deliberately knitting; looking up recipes online to cook), and supportive people she can contact.  The local crisis hotline number was provided to pt, as well as national hotline numbers.  Pt states she will use the safety plan if she feels the need to hurt herself, even "just cutting."  Pt reports no SI/HI at the end of individual session.  Kynleigh Artz J. Maximilliano Kersh, LPCA

## 2017-03-27 NOTE — Therapy (Signed)
[place], [place], [address] Phone: [phone]   Fax:  [phone]  Occupational Therapy Treatment  Patient Details  Name: Christine [name] MRN: [id number] Date of Birth: [birth year] Referring Provider: Dr. [name]  Encounter Date: 03/27/2017      OT End of Session - 03/27/17 1623    Visit Number 4   Number of Visits 6   Date for OT Re-Evaluation 04/08/17   Authorization Type BCBS   OT Start Time 1025   OT Stop Time 1125   OT Time Calculation (min) 60 min   Activity Tolerance Patient tolerated treatment well   Behavior During Therapy [facility] for tasks assessed/performed      Past Medical History:  Diagnosis Date  . Anemia   . Anxiety   . Bipolar 1 disorder ([place])   . Depression   . Fibroids   . High blood pressure   . Mental disorder   . PCOS (polycystic ovarian syndrome)     Past Surgical History:  Procedure Laterality Date  . DIAGNOSTIC LAPAROSCOPY    . DILATION AND CURETTAGE OF UTERUS    . HYSTEROSCOPY W/D&C  07/22/2011   Procedure: DILATATION AND CURETTAGE /HYSTEROSCOPY;  Surgeon: Cyril Mourning, MD;  Location: [facility];  Service: Gynecology;;    There were no vitals filed for this visit.      Subjective Assessment - 03/27/17 1623    Currently in Pain? No/denies         S:  I am very good at budgeting at the grocery store, I have started buying groceries on line - I dont spend as much that way.   O:  Patient participated in skilled OT group focusing on financial management.  Patient identified current methods of budgeting, and ways to improve budgeting.  Patient was educated on budgeting for necessities, creative ways of saving, and educated on use of a budget binder.  Patient was educated and directed to identify current income, review several months of expenditures, and then create budget. A: Patient was engaged throughout session, Patient able to identify ways she  has budgeted in the past and how to improve. P:  Continue skilled OT group 2 times per week for 3 weeks.  Next session, follow up on what new way patient is going to use to save money.  Group focus to be on job searching/job readiness skills.                       OT Education - 03/27/17 1623    Education provided Yes   Education Details educated on Engineer, drilling) Educated Patient   Methods Explanation;Demonstration;Handout   Comprehension Verbalized understanding          OT Short Term Goals - 03/27/17 1624      OT SHORT TERM GOAL #1   Title Patient will be educated on strategies to improve psychosocial skills needed to participate fully in all daily, work, and leisure activities.   Time 3   Period Weeks   Status On-going     OT SHORT TERM GOAL #2   Title Patient will be educated on a HEP and independent with implementation of HEP.   Time 3   Period Weeks   Status On-going     OT SHORT TERM GOAL #3   Title Patient will independently apply psychosocial skills and coping mechanisms to her daily activities in order to function independently.  Time 3   Period Weeks   Status On-going                Patient will benefit from skilled therapeutic intervention in order to improve the following deficits and impairments:   (decreased coping skills, decreased psychosocial skills)  Visit Diagnosis: Bipolar disorder, current episode mixed, moderate (HCC)  Bipolar 1 disorder, mixed, moderate ([place])    Problem List There are no active problems to display for this patient.   Vangie Bicker, [place], OTR/L [phone]  03/27/2017, 4:24 PM  [place], [place], [address] Phone: [phone]   Fax:  [phone]  Name: Christine [name] MRN: [id number] Date of Birth: 05/13/1976

## 2017-03-27 NOTE — Psych (Signed)
   [facility] PHP THERAPIST PROGRESS NOTE  Christine [name] [id number]  Session Time: 9-2  Participation Level: Active  Behavioral Response: CasualAlertAnxious and Depressed  Type of Therapy: Group Therapy, Psychotherapy, Psychoeducation  Treatment Goals addressed: Coping  Interventions: CBT, DBT, Supportive and Reframing  Summary:  9:00 - 10:30 Clinician led check-in regarding current stressors and situation, and review of patient completed daily inventory. Clinician utilized active listening and empathetic response and validated patient emotions. Clinician facilitated processing group on pertinent issues.  10:30 -11:30 OT group 11:30 - 12:00 Cln introduced topic of trust and provided education on appropriate trust foundations within relationships. 12:00 - 12:45 Reflection group: Patients encouraged to practice skills and interpersonal techniques or work on mindfulness and relaxation techniques. The importance of self-care and making skills part of a routine to increase usage were stressed.  12:45 - 1:50 Clinician showed TED talk  "Power of Vulnerability" and led discussion on the ways aversion to vulnerability effect our lives; how vulnerability connects to trust; and the difficulty with vulnerability when there has been hurt in our lives. 1:50 - 2:00 Clinician led check-out. Clinician assessed for immediate needs, medication compliance and efficacy, and safety concerns.     Suicidal/Homicidal: Nowithout intent/plan  Therapist Response: [name] is a 41 y.o. female who presents with manic and depression symptoms. Patient arrived within time allowed and reports she is feeling "okay." Patient rates her mood at a 6 on a 1- 10 scale with 10 being great. Pt reports she cut herself again last night, but was able to distract herself and stop. Pt states it was a busy night filled with "mom responsibilities" such as [employer] at school. Pt reports feeling bad about herself and her parenting  abilities. Pt reports a decrease in racing thoughts. Pt states she is using her skills learned in group, but remains inconsistent with their use. Pt reports she has a small group of friends that she can trust. Group discussed how quality is more important that quantity in relationships. Pt reports she does not trust other people's positive opinions about her.  Patient demonstrates some progress as evidenced by reporting use of multiple distress tolerance skills last night when elevated. Patient denies SI/HI/self-harm thoughts at end of session.  Plan: Patient will continue in PHP and medication management while working on decreasing manic, depression, and anxiety symptoms, as well as thoughts of self-harm, and increasing distress tolerance and emotional regulation skills.  Diagnosis: Bipolar disorder, current episode mixed, moderate ([place]) [F31.62]    1. Bipolar disorder, current episode mixed, moderate ([place])     [name], LCSW 03/27/2017

## 2017-03-28 ENCOUNTER — Other Ambulatory Visit (HOSPITAL_COMMUNITY): Payer: BC Managed Care – PPO

## 2017-03-28 ENCOUNTER — Other Ambulatory Visit (HOSPITAL_COMMUNITY): Payer: BC Managed Care – PPO | Admitting: Licensed Clinical Social Worker

## 2017-03-28 VITALS — BP 136/86 | HR 100 | Ht 64.25 in | Wt 341.0 lb

## 2017-03-28 DIAGNOSIS — F3162 Bipolar disorder, current episode mixed, moderate: Secondary | ICD-10-CM

## 2017-03-28 NOTE — Progress Notes (Signed)
Met with patient this date as she presented with appropriate affect, more depressed mood and crying more today.  Patient admitted she was not having a great day today and that this week has been more up and down with mood and emotions.  Patient stated that the previous day she was having suicidal ideations, had knife out but did not cut and had no plan or intent to harm self at this time.  Patient acknowledged she had a lot going on at home with her girls starting a new school coming up on 03/31/17 and that typically, and she had noticed this week had been worse, her mood was more labile with monthly menstrual cycle.  Patient reported sleeping about 6 hours a night the past few nights, broken some but that she was noticing now she does not feel hung over the next day with current medication regimen.  Patient stated she does not typically use klonopin when she gets upset but agreed this was something that she could try if felt needed.  Patient rated her depression a 7, anxiety a 3 and hopelessness an 8 on a scale of 0-10 with 0 being none and 10 the worst she could experience at this time.  Patient stated she had not followed up with her new hematologist to see if they would see her for potential needed iron infusions and bill her the initial bill.  Patient admitted this could be attributing to her often feeling tired and agreed to try to make some progress to addressing this issue too. Patient reported she would discuss with psychiatrist in the coming week possible plan to check her iron level as she denied any current problems with medications.  Although patient admitted to periodic some suicidal ideations she denied any currently with no plan or intent to harm self and discussed her ability the previous evening not to act on any thoughts. Stated she used distracting techniques learned and coping skills to get through a difficult evening and was prepared to continue to do this through stated plan.  Patient stable at  this time and will call or contact PHP staff if any symptoms worsen or problems with medications. Patient denied any auditory or visual hallucinations, some racing thoughts at times and stated she would try taking prn medication then when her minds starts "wondering".

## 2017-03-31 ENCOUNTER — Ambulatory Visit (HOSPITAL_COMMUNITY): Payer: Self-pay | Admitting: Psychiatry

## 2017-03-31 NOTE — Progress Notes (Signed)
Individual as component of PHP  Cln met with pt in individual to check in on progress and barriers. Pt states she feels she is making progress, but slowly, and states it "feels like I should be further along." Pt reports continued thoughts about "just leaving." Pt shares wanting to leave her family due to the responsibility and feeling they may be better without her. Cln encouraged pt to think about the underlying issues that make leaving seem like a good idea. Pt recognizes feeling overwhelmed with finances and shouldering the financial burden as well as guilt about cheating on her husband. Pt is tearful when discussing the infidelity and states that letting it out is helpful. Cln validated pt's feelings and thanked her for sharing the difficult information. Pt is able to problem solve some options such as couple's counseling.   [name] MSW, LCSW, LCAS

## 2017-03-31 NOTE — Psych (Signed)
   [facility] PHP THERAPIST PROGRESS NOTE  Christine [name] [id number]  Session Time: 9-1  Participation Level: Active  Behavioral Response: CasualAlertEuthymic  Type of Therapy: Group Therapy, Psychotherapy,  Psychoeducation, Individual  Treatment Goals addressed: Coping  Interventions: CBT, DBT, Supportive and Reframing  Summary:  9:00 - 10:30 Clinician led check-in regarding current stressors and situation, and review of patient completed daily inventory. Clinician utilized active listening and empathetic response and validated patient emotions. Clinician facilitated processing group on pertinent issues.  10:30 - 11:00: Individual/Nurse 11:00 -12:00 Clinician introduced the topic of "Healthy Relationships". Group discussed healthy/unhealthy traits for relationships. Patients took [employer] and discussed how love languages can affect relationships.  12:00 - 12:50 Group watched "The Person You Really Need to Marry" Ted-Talk. Group discussed how accepting/not accepting self can affect relationships with others.  12:50 - 1:00 Clinician led check-out. Clinician assessed for immediate needs, medication compliance and efficacy, and safety concerns.    Suicidal/Homicidal: Nowithout intent/plan  Therapist Response: [name] is a 41 y.o. female who presents with depression symptoms. Patient arrived within time allowed and reports she is feeling "ehh." Patient rates her mood at a 5 on a 1- 10 scale with 10 being great. Pt reports some self harm thoughts and was able to identify what skills she used to manage those thoughts. Pt shared she continues to feel ambivalent about life and was able to recognize guilt as a core barrier to her feeling positive about herself and hopeful about the future. Pt identified acts of service as her primary love language and states "it's crazy how many pieces are falling into place" when considering her and her husband's different love languages.  Patient engaged in activity and discussion. Patient demonstrates some progress as evidenced by increased vulnerability and willingness to discuss deep issues. Patient denies SI/HI/self-harm thoughts at end of session.  Plan: Patient will continue in PHP and medication management while working on decreasing manic, depression, and anxiety symptoms and increasing distress tolerance and emotional regulation skills.  Diagnosis: Bipolar disorder, current episode mixed, moderate ([place]) [F31.62]    1. Bipolar disorder, current episode mixed, moderate ([place])     [name], LCSW 03/31/2017

## 2017-04-01 ENCOUNTER — Other Ambulatory Visit (HOSPITAL_COMMUNITY): Payer: Self-pay

## 2017-04-01 ENCOUNTER — Ambulatory Visit (HOSPITAL_COMMUNITY): Payer: Self-pay

## 2017-04-01 ENCOUNTER — Other Ambulatory Visit (HOSPITAL_COMMUNITY): Payer: BC Managed Care – PPO

## 2017-04-02 ENCOUNTER — Other Ambulatory Visit (HOSPITAL_COMMUNITY): Payer: BC Managed Care – PPO

## 2017-04-02 ENCOUNTER — Other Ambulatory Visit (HOSPITAL_COMMUNITY): Payer: BC Managed Care – PPO | Admitting: Licensed Clinical Social Worker

## 2017-04-02 DIAGNOSIS — F3162 Bipolar disorder, current episode mixed, moderate: Secondary | ICD-10-CM | POA: Diagnosis not present

## 2017-04-02 NOTE — Psych (Signed)
   [facility] PHP THERAPIST PROGRESS NOTE  Christine [name] [id number]  Session Time: 9-2  Participation Level: Active  Behavioral Response: CasualAlertEuthymic  Type of Therapy: Group Therapy, Psychotherapy,  Spiritual Care, Activity Therapy  Treatment Goals addressed: Coping  Interventions: CBT, DBT, Supportive and Reframing  Summary:  9:00 - 10:30 Clinician led check-in regarding current stressors and situation, and review of patient completed daily inventory. Clinician utilized active listening and empathetic response and validated patient emotions. Clinician facilitated processing group on pertinent issues.  10:30 -12:00 Spiritual care group  12:00 - 12:45 Reflection group: Patients encouraged to practice skills and interpersonal techniques or work on mindfulness and relaxation techniques. The importance of self-care and making skills part of a routine to increase usage were stressed.  12:45 - 1:50 Relaxation group: Cln Jan Fireman led yoga group focused on retraining the body's response to stress.  1:50 - 2:00 Clinician led check-out. Clinician assessed for immediate needs, medication compliance and efficacy, and safety concerns.   Suicidal/Homicidal: Nowithout intent/plan  Therapist Response: [name] is a 41 y.o. female who presents with depression symptoms. Patient arrived within time allowed and reports she is feeling "foggy." Patient rates her mood at a 7 on a 1- 10 scale with 10 being great. Pt reports she was having car trouble, which prevented her from getting to group the past 2 days.  Pt states she feels better than she did last week, which she attributes to medicine and the use of skills.  Pt reports she is still anxious, mainly due to the car trouble, but less depressed.  Pt states she feels like her ability to concentrate has improved.  Pt reports she is nervous about discharge, but knows she has the skills to help her.  Patient engaged in activity and discussion.  Patient demonstrates some progress as evidenced by decreased depressed mood. Patient denies SI/HI/self-harm thoughts at end of session.  Plan: Patient will continue in PHP and medication management while working on decreasing manic, depression, and anxiety symptoms and increasing distress tolerance and emotional regulation skills.  Diagnosis: Bipolar disorder, current episode mixed, moderate ([place]) [F31.62]    1. Bipolar disorder, current episode mixed, moderate ([place])     Akylah Hascall J Lenis Nettleton, LPCA 04/02/2017

## 2017-04-02 NOTE — Progress Notes (Signed)
04/02/2017 10:45-12:00 Pt attended spirituality group facilitated by chaplain Jerene Pitch  Group focused on topic of community.  Participants engaged with facilitator and group members in a facilitated discussion of topic of community, focusing on where they experience or do not experience community in their lives, values that they look for in supportive communities.

## 2017-04-03 ENCOUNTER — Other Ambulatory Visit (HOSPITAL_COMMUNITY): Payer: BC Managed Care – PPO

## 2017-04-03 ENCOUNTER — Encounter (HOSPITAL_COMMUNITY): Payer: Self-pay | Admitting: Specialist

## 2017-04-03 ENCOUNTER — Other Ambulatory Visit (HOSPITAL_COMMUNITY): Payer: BC Managed Care – PPO | Admitting: Specialist

## 2017-04-03 ENCOUNTER — Other Ambulatory Visit (HOSPITAL_COMMUNITY): Payer: BC Managed Care – PPO | Admitting: Licensed Clinical Social Worker

## 2017-04-03 DIAGNOSIS — F3162 Bipolar disorder, current episode mixed, moderate: Secondary | ICD-10-CM

## 2017-04-03 NOTE — Therapy (Signed)
[place], [place], [address] Phone: [phone]   Fax:  [phone]  Occupational Therapy Treatment  Patient Details  Name: Christine [name] MRN: [id number] Date of Birth: 1976/07/31 Referring Provider: Dr. [name]  Encounter Date: 04/03/2017      OT End of Session - 04/03/17 1309    Visit Number 5   Number of Visits 6   Date for OT Re-Evaluation 04/08/17   Authorization Type BCBS   OT Start Time 1035   OT Stop Time 1135   OT Time Calculation (min) 60 min   Activity Tolerance Patient tolerated treatment well   Behavior During Therapy [facility] for tasks assessed/performed      Past Medical History:  Diagnosis Date  . Anemia   . Anxiety   . Bipolar 1 disorder ([place])   . Depression   . Fibroids   . High blood pressure   . Mental disorder   . PCOS (polycystic ovarian syndrome)     Past Surgical History:  Procedure Laterality Date  . DIAGNOSTIC LAPAROSCOPY    . DILATION AND CURETTAGE OF UTERUS    . HYSTEROSCOPY W/D&C  07/22/2011   Procedure: DILATATION AND CURETTAGE /HYSTEROSCOPY;  Surgeon: Cyril Mourning, MD;  Location: [facility];  Service: Gynecology;;    There were no vitals filed for this visit.      Subjective Assessment - 04/03/17 1309    Currently in Pain? No/denies          S:  I find it easy to talk to people one on one, but not in a group setting.  O:  Patient participated in skilled OT group focusing on improving social and communication skills.  Patient was educated and discussed the benefits of healthy communication and social skills, as well as the detrimental effects of poor communication skills.  Group discussed self-esteem as it was identified as healthy self esteem is a fundamental building block of all other social skills.  Patient filled out "I like me from a to z" handout and discussed with group.   A:  Patient was engaged throughout session.  Patient had  moderate difficulty accepting positive feedback from group members.  P:  Patient is dc from skilled OT group intervention as all goals have been met.                      OT Education - 04/03/17 1309    Education provided Yes   Education Details educated on importance of healthy self esteem   Person(s) Educated Patient   Methods Explanation;Handout   Comprehension Verbalized understanding          OT Short Term Goals - 04/03/17 1309      OT SHORT TERM GOAL #1   Title Patient will be educated on strategies to improve psychosocial skills needed to participate fully in all daily, work, and leisure activities.   Time 3   Period Weeks   Status Achieved     OT SHORT TERM GOAL #2   Title Patient will be educated on a HEP and independent with implementation of HEP.   Time 3   Period Weeks   Status Achieved     OT SHORT TERM GOAL #3   Title Patient will independently apply psychosocial skills and coping mechanisms to her daily activities in order to function independently.   Time 3   Period Weeks   Status Achieved  Patient will benefit from skilled therapeutic intervention in order to improve the following deficits and impairments:     Visit Diagnosis: Bipolar disorder, current episode mixed, moderate (HCC)  Bipolar 1 disorder, mixed, moderate (HCC)    Problem List There are no active problems to display for this patient.   [name] H. Murray, MHA, OTR/L [phone]    OCCUPATIONAL THERAPY DISCHARGE SUMMARY  Visits from Start of Care: 5  Current functional level related to goals / functional outcomes: Indepdent, able to apply skills learned to everyday life.   Remaining deficits: n/a   Education / Equipment: See above  Plan: Patient agrees to discharge.  Patient goals were met. Patient is being discharged due to meeting the stated rehab goals.  ?????     [name] H. Murray, MHA, OTR/L [phone]  04/03/2017,  1:10 PM  [facility] [address] [place], [place], [address] Phone: [phone]   Fax:  [phone]  Name: [name] Kempton MRN: [id number] Date of Birth: 01/23/1976 

## 2017-04-03 NOTE — Psych (Signed)
   [facility] PHP THERAPIST PROGRESS NOTE  Christine [name] [id number]  Session Time: 9-2  Participation Level: Active  Behavioral Response: CasualAlertEuthymic  Type of Therapy: Group Therapy, Psychotherapy, Psychoeducation, Occupational Therapy  Treatment Goals addressed: Coping  Interventions: CBT, DBT, Supportive and Reframing  Summary:  9:00 - 10:30 Clinician led check-in regarding current stressors and situation, and review of patient completed daily inventory. Clinician utilized active listening and empathetic response and validated patient emotions. Clinician facilitated processing group on pertinent issues.  10:30 -11:30: OT Group  11:30 - 12:00: Clinician reviewed topic of "Boundaries" from previous day. 12:00 - 12:45: Reflection group: Patients encouraged to practice skills and interpersonal techniques or work on mindfulness and relaxation techniques. The importance of self-care and making skills part of a routine to increase usage were stressed.  12:45 - 1:50  Cln introduced topic of how to set and maintain boundaries. Patients discussed specific scenarios in life where boundaries need to be addressed and ways to address them.  1:50 - 2:00 Clinician led check-out. Clinician assessed for immediate needs, medication compliance and efficacy, and safety concerns.    Suicidal/Homicidal: Nowithout intent/plan  Therapist Response: [name] is a 41 y.o. female who presents with depression symptoms. Patient arrived within time allowed and reports she is feeling "a little better than yesterday." Patient rates her mood at a 7.5 on a 1- 10 scale with 10 being great. Pt states she achieved a goal of reaching out to an old coworker and having a good conversation. Pt reports thinking about how to put the skills she is learning into practice and is able to come up with ways to incorporate into her every day life. Pt states "it feels like I'm doing okay and that is kind of scary." Pt reports  she is using thought stopping to manage the anxiety that the other shoe will drop. Patient engaged in activity and discussion. Pt identifies that she feels stronger at setting boundaries with others, but not about maintaining herself. Patient demonstrates some progress as evidenced by forward thinking. Patient denies SI/HI/self-harm thoughts at end of session.  Plan: Patient will continue in PHP and medication management while working on decreasing manic, depression, and anxiety symptoms and increasing distress tolerance and emotional regulation skills.  Diagnosis: Bipolar 1 disorder, mixed, moderate (HCC) [F31.62]    1. Bipolar 1 disorder, mixed, moderate ([place])     [name], LCSW 04/03/2017

## 2017-04-03 NOTE — Psych (Signed)
  [facility] Partial Hospitalization Program Psych Discharge Summary  Christine [name] [id number]  Admission date: 03/19/2017 Discharge date: 04/03/2017  Reason for admission: bipolar depression  Progress in Program Toward Treatment Goals: good progress.  Depression decreased.  The hypomania has gone.  She is still anxious and somewhat depressed but feels more in control of herself and optimistic about her future.  Feels more supported here in that people have taken time to get to know her and will be here for her if she needs them.    Progress (rationale): she believes the medication is working and the groups and coping skills have been very helpful  Discharge Plan: Referral to Psychiatrist and Referral to Counselor/Psychotherapist.  Continue oxcarbazepine 600 mg bid, lamotrigine 150 mg bid, clonazepam 1 mg tid prn.    [name], MD 04/03/2017

## 2017-04-04 ENCOUNTER — Other Ambulatory Visit (HOSPITAL_COMMUNITY): Payer: BC Managed Care – PPO

## 2017-04-04 ENCOUNTER — Other Ambulatory Visit (HOSPITAL_COMMUNITY): Payer: BC Managed Care – PPO | Admitting: Licensed Clinical Social Worker

## 2017-04-04 DIAGNOSIS — F3162 Bipolar disorder, current episode mixed, moderate: Secondary | ICD-10-CM

## 2017-04-04 NOTE — Progress Notes (Signed)
Met with patient at the end of PHP today to assess her status with preparation to end the partial program.  Patient stated she was doing much better and prepared for ending the program today. States her mood has improved and leveled and denies any concerns or problems at this time.  Patient stated she was still going to follow up on anemia and agreed would call this nurse back if any problems with seeing a local hematologist.  Patient with no suicidal or homicidal ideations or other symptoms at this time and currently stable with medication regimen.

## 2017-04-08 ENCOUNTER — Other Ambulatory Visit (HOSPITAL_COMMUNITY): Payer: BC Managed Care – PPO

## 2017-04-08 ENCOUNTER — Ambulatory Visit (HOSPITAL_COMMUNITY): Payer: Self-pay

## 2017-04-08 ENCOUNTER — Other Ambulatory Visit (HOSPITAL_COMMUNITY): Payer: Self-pay

## 2017-04-08 NOTE — Psych (Signed)
   [facility] PHP THERAPIST PROGRESS NOTE  Carlen Rebuck [id number]  Session Time: 9-1  Participation Level: Active  Behavioral Response: CasualAlertEuthymic  Type of Therapy: Group Therapy, Psychotherapy, Psychoeducation, ActivityTherapy  Treatment Goals addressed: Coping  Interventions: CBT, DBT, Supportive and Reframing  Summary:  9:00 - 10:00 Clinician led check-in regarding current stressors and situation, and review of patient completed daily inventory. Clinician utilized active listening and empathetic response and validated patient emotions. Clinician facilitated processing group on pertinent issues.  10:00 -11:00: Clinician introduced topic of "Positive Psychology". Group watched "Positive Psychology" Ted-Talk. Patients discussed how their "lens" of life effects the way they feel. Patients identified two strategies they would be willing to try to change their "lens." 11:00 - 12:00: Clinician led psychotherapy group on "11 beliefs that make life better" and group discussed the ways in which distorted thinking play into their perception of reality.  12:00 - 12:50 Clinician led gratitude activity.  12:50 - 1:00 Clinician led check-out. Clinician assessed for immediate needs, medication compliance and efficacy, and safety concerns.    Suicidal/Homicidal: Nowithout intent/plan  Therapist Response: Jesseka Drinkard is a 41 y.o. female who presents with depression symptoms. Patient arrived within time allowed and reports she is feeling "pretty good." Patient rates her mood at a 7 on a 1- 10 scale with 10 being great. Pt shares she tried to focus on multiple tasks yesterday and couldn't, so she tried to relax. Pt reports some success with relaxation however felt guilty this morning that she had not been productive. Pt reports "I'm trying to be kinder to myself." Patient engaged in activity and discussion. Patient demonstrates some progress as evidenced by putting skills into practice and  increased insight. Patient denies SI/HI/self-harm thoughts at end of session.  Plan: Patient will discharge from [place] due to meeting treatment goals of decreased depression and anxiety, increased mood stabilization, and increased ability to manage mood swings, impulsiveness, and utilize coping skills. Pt and psychiatrist report alignment with discharge. Pt will return to OP providers and has follow up appointment with therapist Binnie Rail, 05/01/17, and psychiatrist 04/23/17 with Dr Adele Schilder. Pt denies SI/HI at discharge.   Diagnosis: Bipolar 1 disorder, mixed, moderate (HCC) [F31.62]    1. Bipolar 1 disorder, mixed, moderate ([place])     [name], LCSW 04/08/2017

## 2017-04-09 ENCOUNTER — Other Ambulatory Visit (HOSPITAL_COMMUNITY): Payer: Self-pay

## 2017-04-09 ENCOUNTER — Other Ambulatory Visit (HOSPITAL_COMMUNITY): Payer: BC Managed Care – PPO

## 2017-04-10 ENCOUNTER — Other Ambulatory Visit (HOSPITAL_COMMUNITY): Payer: Self-pay

## 2017-04-10 ENCOUNTER — Ambulatory Visit (HOSPITAL_COMMUNITY): Payer: Self-pay

## 2017-04-10 ENCOUNTER — Other Ambulatory Visit (HOSPITAL_COMMUNITY): Payer: BC Managed Care – PPO

## 2017-04-11 ENCOUNTER — Other Ambulatory Visit (HOSPITAL_COMMUNITY): Payer: Self-pay

## 2017-04-11 ENCOUNTER — Other Ambulatory Visit (HOSPITAL_COMMUNITY): Payer: BC Managed Care – PPO

## 2017-04-14 ENCOUNTER — Other Ambulatory Visit (HOSPITAL_COMMUNITY): Payer: Self-pay

## 2017-04-14 ENCOUNTER — Other Ambulatory Visit (HOSPITAL_COMMUNITY): Payer: BC Managed Care – PPO

## 2017-04-15 ENCOUNTER — Other Ambulatory Visit (HOSPITAL_COMMUNITY): Payer: Self-pay

## 2017-04-15 ENCOUNTER — Other Ambulatory Visit (HOSPITAL_COMMUNITY): Payer: BC Managed Care – PPO

## 2017-04-15 ENCOUNTER — Ambulatory Visit (HOSPITAL_COMMUNITY): Payer: Self-pay

## 2017-04-16 ENCOUNTER — Other Ambulatory Visit (HOSPITAL_COMMUNITY): Payer: BC Managed Care – PPO

## 2017-04-16 ENCOUNTER — Other Ambulatory Visit (HOSPITAL_COMMUNITY): Payer: Self-pay

## 2017-04-17 ENCOUNTER — Ambulatory Visit (HOSPITAL_COMMUNITY): Payer: Self-pay

## 2017-04-17 ENCOUNTER — Other Ambulatory Visit (HOSPITAL_COMMUNITY): Payer: Self-pay

## 2017-04-17 ENCOUNTER — Other Ambulatory Visit (HOSPITAL_COMMUNITY): Payer: BC Managed Care – PPO

## 2017-04-18 ENCOUNTER — Other Ambulatory Visit (HOSPITAL_COMMUNITY): Payer: Self-pay

## 2017-04-18 ENCOUNTER — Other Ambulatory Visit (HOSPITAL_COMMUNITY): Payer: BC Managed Care – PPO

## 2017-04-21 ENCOUNTER — Other Ambulatory Visit (HOSPITAL_COMMUNITY): Payer: Self-pay

## 2017-04-21 ENCOUNTER — Other Ambulatory Visit (HOSPITAL_COMMUNITY): Payer: BC Managed Care – PPO

## 2017-04-22 ENCOUNTER — Other Ambulatory Visit (HOSPITAL_COMMUNITY): Payer: BC Managed Care – PPO

## 2017-04-22 ENCOUNTER — Other Ambulatory Visit (HOSPITAL_COMMUNITY): Payer: Self-pay

## 2017-04-23 ENCOUNTER — Other Ambulatory Visit (HOSPITAL_COMMUNITY): Payer: BC Managed Care – PPO

## 2017-04-23 ENCOUNTER — Other Ambulatory Visit (HOSPITAL_COMMUNITY): Payer: Self-pay

## 2017-04-23 ENCOUNTER — Ambulatory Visit (HOSPITAL_COMMUNITY): Payer: Self-pay | Admitting: Psychiatry

## 2017-04-24 ENCOUNTER — Other Ambulatory Visit (HOSPITAL_COMMUNITY): Payer: Self-pay

## 2017-04-24 ENCOUNTER — Other Ambulatory Visit (HOSPITAL_COMMUNITY): Payer: BC Managed Care – PPO

## 2017-04-24 ENCOUNTER — Ambulatory Visit (HOSPITAL_COMMUNITY): Payer: Self-pay

## 2017-04-25 ENCOUNTER — Other Ambulatory Visit (HOSPITAL_COMMUNITY): Payer: BC Managed Care – PPO

## 2017-04-25 ENCOUNTER — Other Ambulatory Visit (HOSPITAL_COMMUNITY): Payer: Self-pay

## 2017-04-28 ENCOUNTER — Other Ambulatory Visit (HOSPITAL_COMMUNITY): Payer: BC Managed Care – PPO

## 2017-04-29 ENCOUNTER — Other Ambulatory Visit (HOSPITAL_COMMUNITY): Payer: BC Managed Care – PPO

## 2017-04-29 ENCOUNTER — Ambulatory Visit (HOSPITAL_COMMUNITY): Payer: Self-pay

## 2017-04-30 ENCOUNTER — Other Ambulatory Visit (HOSPITAL_COMMUNITY): Payer: BC Managed Care – PPO

## 2017-05-01 ENCOUNTER — Encounter (HOSPITAL_COMMUNITY): Payer: Self-pay | Admitting: Licensed Clinical Social Worker

## 2017-05-01 ENCOUNTER — Ambulatory Visit (HOSPITAL_COMMUNITY): Payer: Self-pay

## 2017-05-01 ENCOUNTER — Ambulatory Visit (INDEPENDENT_AMBULATORY_CARE_PROVIDER_SITE_OTHER): Payer: BC Managed Care – PPO | Admitting: Licensed Clinical Social Worker

## 2017-05-01 ENCOUNTER — Other Ambulatory Visit (HOSPITAL_COMMUNITY): Payer: BC Managed Care – PPO

## 2017-05-01 DIAGNOSIS — F3162 Bipolar disorder, current episode mixed, moderate: Secondary | ICD-10-CM
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
## 2017-05-02 ENCOUNTER — Other Ambulatory Visit (HOSPITAL_COMMUNITY): Payer: BC Managed Care – PPO

## 2017-05-08 ENCOUNTER — Encounter (HOSPITAL_COMMUNITY): Payer: Self-pay | Admitting: Psychiatry

## 2017-05-08 ENCOUNTER — Ambulatory Visit (INDEPENDENT_AMBULATORY_CARE_PROVIDER_SITE_OTHER): Payer: BC Managed Care – PPO | Admitting: Psychiatry

## 2017-05-08 VITALS — BP 158/82 | HR 100 | Ht 65.0 in | Wt 339.4 lb

## 2017-05-08 DIAGNOSIS — F419 Anxiety disorder, unspecified: Secondary | ICD-10-CM | POA: Diagnosis not present

## 2017-05-08 DIAGNOSIS — Z6841 Body Mass Index (BMI) 40.0 and over, adult: Secondary | ICD-10-CM | POA: Diagnosis not present

## 2017-05-08 DIAGNOSIS — Z658 Other specified problems related to psychosocial circumstances: Secondary | ICD-10-CM

## 2017-05-08 DIAGNOSIS — G43909 Migraine, unspecified, not intractable, without status migrainosus: Secondary | ICD-10-CM | POA: Diagnosis not present

## 2017-05-08 DIAGNOSIS — E669 Obesity, unspecified: Secondary | ICD-10-CM

## 2017-05-08 DIAGNOSIS — R454 Irritability and anger: Secondary | ICD-10-CM

## 2017-05-08 DIAGNOSIS — Z818 Family history of other mental and behavioral disorders: Secondary | ICD-10-CM | POA: Diagnosis not present

## 2017-05-08 DIAGNOSIS — R569 Unspecified convulsions: Secondary | ICD-10-CM | POA: Diagnosis not present

## 2017-05-08 DIAGNOSIS — F3162 Bipolar disorder, current episode mixed, moderate: Secondary | ICD-10-CM | POA: Diagnosis not present

## 2017-05-08 DIAGNOSIS — F39 Unspecified mood [affective] disorder: Secondary | ICD-10-CM | POA: Diagnosis not present

## 2017-05-08 DIAGNOSIS — R450 Nervousness: Secondary | ICD-10-CM | POA: Diagnosis not present

## 2017-05-08 MED ORDER — LAMOTRIGINE 150 MG PO TABS: 300.0000 mg | ORAL_TABLET | Freq: Every day | ORAL | 1 refills | Status: DC

## 2017-05-08 MED ORDER — ARIPIPRAZOLE 5 MG PO TABS: 5.0000 mg | ORAL_TABLET | Freq: Every day | ORAL | 1 refills | Status: DC

## 2017-05-08 MED ORDER — OXCARBAZEPINE 600 MG PO TABS: 600.0000 mg | ORAL_TABLET | Freq: Two times a day (BID) | ORAL | 1 refills | Status: DC

## 2017-05-08 NOTE — Progress Notes (Signed)
[place] MD/PA/NP OP Progress Note  05/08/2017 3:51 PM Christine [name]  MRN:  [id number]  Chief Complaint:  I finish PHP.  I'm feeling better.  I am no longer taking Latuda.  HPI: Christine [name] is 41 year old African-American , married, currently on disability came for her follow-up appointment.  She was seen first time in July .  She was referred from primary care physician for the management of her bipolar disorder.  Patient has long history of mental disorder and she continued to have mood swing, irritability, highs and lows in her mood , poor sleep and racing thoughts.  We recommended Latuda 20 mg but she started to get worse and her therapist recommended IOP.  In the IOP her Latuda was increased to 40 mg but she started to have more anxiety, depression and she started to cut herself and she was transferred to [facility].  In the PHP her Latuda was discontinue and Trileptal was increased.  She is feeling better since she finished up partial hospitalization program.  She sleeping better.  She is taking Klonopin only as needed and in past one week she has taken few times.  Her sleep is improved.  However she still have manic-like symptoms and sometime she gets irritability, mood swing, highs and lows in her mood.  She denies any paranoia or any hallucination but still feel nervous and anxious some time.  She lives with her husband who is very supportive.  She has 2 adopted children who lives with her.  Patient has multiple health issues including TCO's, obesity, history of multiple miscarriages, migraine headaches and non-elliptical seizures.  She like to go back on Abilify because she took Abilify for many years until it was discontinued when she was last hospitalized.  She is feeling Trileptal helping her mood but she still have moments of mood swing, racing thoughts.  Her last blood work was done in February and her sodium was normal.  Patient has no tremors shakes or any EPS.  She is seeing Beth McKinzie for therapy.   Patient denies drinking alcohol or using any illegal substances.  Today her blood pressure is slightly increased and she admitted that she is noncompliant with her blood pressure medication.  She denies any chest pain, shortness of breath, palpitation or any headaches.  Visit Diagnosis:    ICD-10-CM   1. Bipolar disorder, current episode mixed, moderate (HCC) F31.62 oxcarbazepine (TRILEPTAL) 600 MG tablet    lamoTRIgine (LAMICTAL) 150 MG tablet    ARIPiprazole (ABILIFY) 5 MG tablet    Past Psychiatric History: Reviewed.   Patient reported history of depression since age 43.  She started taking antidepressant which makes her manic .  In the past she had tried Risperdal, Seroquel, Abilify, lithium, Prozac, Paxil, Zoloft, Celexa Lexapro, Celexa, Effexor and Klonopin.  She has 3 hospitalization due to severe mood swing and impulsive behavior.  She had a history of manic symptoms which include severe impulsive behavior, excessive talking, poor sleep and distractibility and hypersexual behavior.  She has history of cutting herself .  Her last hospital patient was in November 2017.  Recently we tried [name] but it makes her more anxious and she was admitted to IOP and then PHP.  She was seeing psychiatrist Dr. [name] .   Past Medical History:  Past Medical History:  Diagnosis Date  . Anemia   . Anxiety   . Bipolar 1 disorder ([place])   . Depression   . Fibroids   . High blood pressure   .  Mental disorder   . PCOS (polycystic ovarian syndrome)     Past Surgical History:  Procedure Laterality Date  . DIAGNOSTIC LAPAROSCOPY    . DILATION AND CURETTAGE OF UTERUS    . HYSTEROSCOPY W/D&C  07/22/2011   Procedure: DILATATION AND CURETTAGE /HYSTEROSCOPY;  Surgeon: Cyril Mourning, MD;  Location: [facility];  Service: Gynecology;;    Family Psychiatric History: Reviewed.   Family History:  Family History  Problem Relation Age of Onset  . Anxiety disorder Mother   . Depression Mother      Social History:  Social History   Social History  . Marital status: Married    Spouse name: N/A  . Number of children: N/A  . Years of education: N/A   Social History Main Topics  . Smoking status: Never Smoker  . Smokeless tobacco: Never Used  . Alcohol use No     Comment: rare  . Drug use: No  . Sexual activity: Yes    Partners: Male    Birth control/ protection: None   Other Topics Concern  . Not on file   Social History Narrative  . No narrative on file    Allergies:  Allergies  Allergen Reactions  . Penicillins Rash    Metabolic Disorder Labs: No results found for: HGBA1C, MPG No results found for: PROLACTIN No results found for: CHOL, TRIG, HDL, CHOLHDL, VLDL, LDLCALC No results found for: TSH  Therapeutic Level Labs: No results found for: LITHIUM No results found for: VALPROATE No components found for:  CBMZ  Current Medications: Current Outpatient Prescriptions  Medication Sig Dispense Refill  . amLODipine (NORVASC) 10 MG tablet Take 10 mg by mouth daily.  6  . clonazePAM (KLONOPIN) 1 MG tablet Take 1 tablet (1 mg total) by mouth 3 (three) times daily. 60 tablet 0  . fluticasone (FLONASE) 50 MCG/ACT nasal spray SHAKE LQ AND U 2 SPRAYS IEN QD  4  . lamoTRIgine (LAMICTAL) 150 MG tablet Take 2 tablets (300 mg total) by mouth daily. 60 tablet 0  . meloxicam (MOBIC) 7.5 MG tablet Take 7.5 mg by mouth 2 (two) times daily.  2  . metFORMIN (GLUCOPHAGE-XR) 750 MG 24 hr tablet Take 750 mg by mouth daily.  11  . oxcarbazepine (TRILEPTAL) 600 MG tablet Take 1 tablet (600 mg total) by mouth 2 (two) times daily. 60 tablet 1   No current facility-administered medications for this visit.      Musculoskeletal: Strength & Muscle Tone: within normal limits Gait & Station: normal Patient leans: N/A  Psychiatric Specialty Exam: Review of Systems  Constitutional: Negative.   HENT: Negative.   Respiratory: Negative.   Cardiovascular: Negative.   Skin:  Negative.   Neurological: Negative.   Psychiatric/Behavioral: Positive for depression. The patient is nervous/anxious.     Blood pressure (!) 158/82, pulse 100, height 5\' 5"  (1.651 m), weight (!) 339 lb 6.4 oz (154 kg).Body mass index is 56.48 kg/m.  General Appearance: Casual  Eye Contact:  Fair  Speech:  Clear and Coherent and Fast  Volume:  Normal  Mood:  Emotional  Affect:  Labile  Thought Process:  Descriptions of Associations: Circumstantial  Orientation:  Full (Time, Place, and Person)  Thought Content: Rumination   Suicidal Thoughts:  No  Homicidal Thoughts:  No  Memory:  Immediate;   Good Recent;   Good Remote;   Good  Judgement:  Good  Insight:  Good  Psychomotor Activity:  Increased  Concentration:  Concentration: Fair and Attention  Span: Fair  Recall:  Good  Fund of Knowledge: Good  Language: Good  Akathisia:  No  Handed:  Right  AIMS (if indicated): not done  Assets:  Communication Skills Desire for Improvement Housing Resilience Social Support  ADL's:  Intact  Cognition: WNL  Sleep:  Fair   Screenings: GAD-7     Counselor from 04/04/2017 in [place] Counselor from 03/18/2017 in [place]  Total GAD-7 Score  7  18    PHQ2-9     Counselor from 04/04/2017 in [place] Counselor from 03/28/2017 in [place] Counselor from 03/18/2017 in [place]  PHQ-2 Total Score  2  4  5   PHQ-9 Total Score  10  15  18        Assessment and Plan: Bipolar disorder, mixed.  Anxiety disorder NOS.  I review her symptoms, history and psychosocial stressors.  Reinforce to take her medication including her blood pressure medication and if she had any symptoms associated with hypertension then she should call primary care physician immediately. I reviewed discharge summary from  intensive outpatient program in PHP.  She is no longer taking Latuda.  Her Trileptal dose was increased and now she is taking 600 mg twice a day.  Her last blood work shows normal sodium which was done in February.  I will continue Lamictal 150 mg twice a day, Trileptal 600 mg twice a day and Klonopin 1 mg as needed for severe anxiety.  I recommended to try Abilify which she had taken in the past for many years with good response but it was discontinued when she was admitted last November.  She do not recall any side effects from the Abilify.  Encouraged to keep appointment with Charolotte Eke for counseling.  We are still awaiting records from her previous psychiatrist Dr. Caprice Beaver.  She has no rash, itching, tremors or shakes.  Discuss self abusive behavior, patient has history of cutting herself while she was in [facility] but since she discharged from the program she has not done impulsive behavior.  Recommended to call us back if she is any question, concern or if she feels worsening of the symptom.  Discuss safety concern that anytime having active suicidal thoughts or homicidal thought.  Call 911 or go to the local emergency room.  Follow-up in 6 weeks.  Time spent 25 minutes.  Ellarie Picking T., MD 05/08/2017, 3:51 PM

## 2017-05-21 ENCOUNTER — Ambulatory Visit (HOSPITAL_COMMUNITY): Payer: Self-pay | Admitting: Licensed Clinical Social Worker

## 2017-05-23 ENCOUNTER — Telehealth (HOSPITAL_COMMUNITY): Payer: Self-pay | Admitting: *Deleted

## 2017-05-23 NOTE — Telephone Encounter (Signed)
Prior authorization for Abilify received. Called [facility] at [phone] spoke with Abigail Butts who stated that a paid claim was received and no authorization is required until after 08/04/17. Price is $30.

## 2017-06-04 ENCOUNTER — Encounter (HOSPITAL_COMMUNITY): Payer: Self-pay | Admitting: Licensed Clinical Social Worker

## 2017-06-04 ENCOUNTER — Ambulatory Visit (INDEPENDENT_AMBULATORY_CARE_PROVIDER_SITE_OTHER): Payer: BC Managed Care – PPO | Admitting: Licensed Clinical Social Worker

## 2017-06-04 DIAGNOSIS — F3162 Bipolar disorder, current episode mixed, moderate: Secondary | ICD-10-CM

## 2017-06-04 NOTE — Progress Notes (Signed)
   THERAPIST PROGRESS NOTE  Session Time: 10:10-11am  Participation Level: Active  Behavioral Response: CasualAlertAnxious  Type of Therapy: Individual Therapy  Treatment Goals addressed: Coping  Interventions: Supportive  Summary: Christine [name] is a 42 y.o. female who present for her initial individual therapy appointment. Pt reports she has had a rough week, "ive gotten worse." Her paranoia has increased, she was seeing and hearing things (amplified), anger. She had many suicidal thoughts. Spoke with pt about the necessity to seek help when she has suicidal thoughts, discussing alternatives. Pt does not want to go back to the hospital. Discussed her return to [facility]. Pt just wants to feel better. Pt had a problem getting her Abilify but has now been taking it for 3 days. She thinks she is beginning to stabilize. Pt will contact this office to decide the next path for tx. She made additional appts so she can be seen more frequent as well.   Suicidal/Homicidal: Nowithout intent/plan  Therapist Response: Assessed pt'[name] current function by self report and reviewed progress. Assisted pt discussing options and assistance for continued mental health symptoms  Plan: Return again in 2 weeks.  Diagnosis: Axis I:  Bipolar 1 Disorder, mixed moderate    Christine [name], LCAS 06/04/2017

## 2017-06-18 ENCOUNTER — Ambulatory Visit (INDEPENDENT_AMBULATORY_CARE_PROVIDER_SITE_OTHER): Payer: BC Managed Care – PPO | Admitting: Licensed Clinical Social Worker

## 2017-06-18 ENCOUNTER — Encounter (HOSPITAL_COMMUNITY): Payer: Self-pay | Admitting: Licensed Clinical Social Worker

## 2017-06-18 DIAGNOSIS — F3162 Bipolar disorder, current episode mixed, moderate: Secondary | ICD-10-CM

## 2017-06-18 NOTE — Progress Notes (Signed)
   THERAPIST PROGRESS NOTE  Session Time: 10:10-11am  Participation Level: Active  Behavioral Response: CasualAlertAnxious  Type of Therapy: Individual Therapy  Treatment Goals addressed: Coping  Interventions: Supportive  Summary: Christine [name] is a 41 y.o. female who present for her initial individual therapy appointment. Pt discussed her psychiatric symptoms and current life events. Pt reports she continues to experience mania, hallucinations, racing thoughts. She continues with  suicidal thoughts, but not as bad as last session. Pt reports her depression and anxiety symptoms have lessened. Again, spoke with pt about the necessity to seek help when she has suicidal thoughts, discussing alternatives. Again discussed her return to [facility].  Pt just wants to feel better. Pt sees her psychatrist Friday and assisted pt with a list of questions and concerns to ask him. Asked pt about her coping skills she learned in [facility] and she reports she is not using them. Discussed several with pt: meditation, mindfulness, breathing exercises - gave pt handouts.      Suicidal/Homicidal: Nowithout intent/plan  Therapist Response: Assessed pt's current function by self report and reviewed progress. Assisted pt processing her psychiatric symptoms, alternatives, psychiatrist appt,  Assisted pt processing for the management of her stressors.   Plan: Return again in 2 weeks.  Diagnosis: Axis I:  Bipolar 1 Disorder, mixed moderate    MACKENZIE,LISBETH S, LCAS 06/18/2017

## 2017-06-20 ENCOUNTER — Ambulatory Visit (INDEPENDENT_AMBULATORY_CARE_PROVIDER_SITE_OTHER): Payer: BC Managed Care – PPO | Admitting: Psychiatry

## 2017-06-20 ENCOUNTER — Encounter (HOSPITAL_COMMUNITY): Payer: Self-pay | Admitting: Psychiatry

## 2017-06-20 VITALS — BP 136/82 | HR 113 | Ht 65.0 in | Wt 332.2 lb

## 2017-06-20 DIAGNOSIS — G43909 Migraine, unspecified, not intractable, without status migrainosus: Secondary | ICD-10-CM | POA: Diagnosis not present

## 2017-06-20 DIAGNOSIS — Z79899 Other long term (current) drug therapy: Secondary | ICD-10-CM

## 2017-06-20 DIAGNOSIS — F3162 Bipolar disorder, current episode mixed, moderate: Secondary | ICD-10-CM

## 2017-06-20 DIAGNOSIS — Z6841 Body Mass Index (BMI) 40.0 and over, adult: Secondary | ICD-10-CM | POA: Diagnosis not present

## 2017-06-20 DIAGNOSIS — Z818 Family history of other mental and behavioral disorders: Secondary | ICD-10-CM

## 2017-06-20 DIAGNOSIS — E282 Polycystic ovarian syndrome: Secondary | ICD-10-CM | POA: Diagnosis not present

## 2017-06-20 DIAGNOSIS — F419 Anxiety disorder, unspecified: Secondary | ICD-10-CM | POA: Diagnosis not present

## 2017-06-20 DIAGNOSIS — E669 Obesity, unspecified: Secondary | ICD-10-CM | POA: Diagnosis not present

## 2017-06-20 DIAGNOSIS — G4089 Other seizures: Secondary | ICD-10-CM | POA: Diagnosis not present

## 2017-06-20 MED ORDER — LITHIUM CARBONATE ER 450 MG PO TBCR: 450.0000 mg | EXTENDED_RELEASE_TABLET | Freq: Every day | ORAL | 1 refills | Status: DC

## 2017-06-20 MED ORDER — ARIPIPRAZOLE 10 MG PO TABS: 10.0000 mg | ORAL_TABLET | Freq: Every day | ORAL | 1 refills | Status: DC

## 2017-06-20 MED ORDER — LAMOTRIGINE 150 MG PO TABS: 300.0000 mg | ORAL_TABLET | Freq: Every day | ORAL | 1 refills | Status: DC

## 2017-06-20 NOTE — Progress Notes (Signed)
[place] MD/PA/NP OP Progress Note  06/20/2017 10:11 AM Christine [name]  MRN:  [id number]  Chief Complaint: I still have a lot of mood swings and hallucinations.  I do not think Trileptal working.  HPI: Christine [name] came for her follow-up appointment.  She is a 41 year old African-American married female who recently finish PHP.  She started seeing Beth for counseling.  She continues to have hallucination, paranoia, irritability and mood swings.  She feels sometimes very depressed, isolated withdrawn and then extreme frustrated irritable and having manic-like symptoms.  She endorsed visual and auditory hallucination.  She feels people talking to her and sometimes she hears music.  On her last visit we started her on Abilify but she did not started until 2 weeks ago.  She admitted since taking Abilify she is feeling somewhat better.  She is sleeping 4-5 hours.  She still feels very anxious nervous and endorsed when she takes the Klonopin it helps.  Though she denies any suicidal thoughts or homicidal thoughts but feel decreased energy, concentration, irritability and paranoia.  Patient denies any recent cutting to herself.  We are still awaiting records from Dr. Tomasita Crumble [place]'s office.  Patient also had multiple health issues including obesity, migraine headaches, nonelectrical seizures and polycystic ovary.  She has not seen primary care physician and she realized she need to see soon.  Patient denies drinking alcohol or using any illegal substances.  She lives with her husband who is supportive with 2 adopted children.  Today patient also told that she had genetic testing in 2016 when she was seeing psychiatrist in [facility].  She is hoping to get those results.  She also wants to go back on lithium because she believe she did not give enough time to lithium.  Patient has no tremors shakes or any EPS.  Her appetite is okay.  Her vital signs are stable.  Visit Diagnosis:    ICD-10-CM   1. Bipolar  disorder, current episode mixed, moderate (HCC) F31.62 ARIPiprazole (ABILIFY) 10 MG tablet    lithium carbonate (ESKALITH) 450 MG CR tablet    lamoTRIgine (LAMICTAL) 150 MG tablet    Past Psychiatric History: Reviewed. Patient reported history of depression since age 67.  She started taking antidepressant which makes her manic .  In the past she had tried Risperdal, Seroquel, Abilify, lithium, Prozac, Paxil, Zoloft, Celexa Lexapro, Celexa, Effexor and Klonopin.  She has 3 hospitalization due to severe mood swing and impulsive behavior.  She had a history of manic symptoms which include severe impulsive behavior, excessive talking, poor sleep and distractibility and hypersexual behavior.  She has history of cutting herself .  Her last hospital patient was in November 2017.  Recently we tried [name] but it makes her more anxious and she was admitted to IOP and then PHP.  She was seeing psychiatrist Dr. [name] .    Past Medical History:  Past Medical History:  Diagnosis Date  . Anemia   . Anxiety   . Bipolar 1 disorder ([place])   . Depression   . Fibroids   . High blood pressure   . Mental disorder   . PCOS (polycystic ovarian syndrome)     Past Surgical History:  Procedure Laterality Date  . DIAGNOSTIC LAPAROSCOPY    . DILATATION AND CURETTAGE /HYSTEROSCOPY  07/22/2011   Performed by Cyril Mourning, MD at [facility] ORS  . DILATION AND CURETTAGE OF UTERUS      Family Psychiatric History: Reviewed.  Family History:  Family  History  Problem Relation Age of Onset  . Anxiety disorder Mother   . Depression Mother     Social History:  Social History   Socioeconomic History  . Marital status: Married    Spouse name: Not on file  . Number of children: Not on file  . Years of education: Not on file  . Highest education level: Not on file  Social Needs  . Financial resource strain: Not on file  . Food insecurity - worry: Not on file  . Food insecurity - inability: Not on file   . Transportation needs - medical: Not on file  . Transportation needs - non-medical: Not on file  Occupational History  . Not on file  Tobacco Use  . Smoking status: Never Smoker  . Smokeless tobacco: Never Used  Substance and Sexual Activity  . Alcohol use: No    Comment: rare  . Drug use: No  . Sexual activity: Yes    Partners: Male    Birth control/protection: None  Other Topics Concern  . Not on file  Social History Narrative  . Not on file    Allergies:  Allergies  Allergen Reactions  . Penicillins Rash    Metabolic Disorder Labs: No results found for: HGBA1C, MPG No results found for: PROLACTIN No results found for: CHOL, TRIG, HDL, CHOLHDL, VLDL, LDLCALC No results found for: TSH  Therapeutic Level Labs: No results found for: LITHIUM No results found for: VALPROATE No components found for:  CBMZ  Current Medications: Current Outpatient Medications  Medication Sig Dispense Refill  . amLODipine (NORVASC) 10 MG tablet Take 10 mg by mouth daily.  6  . ARIPiprazole (ABILIFY) 5 MG tablet Take 1 tablet (5 mg total) by mouth daily. 30 tablet 1  . clonazePAM (KLONOPIN) 1 MG tablet Take 1 tablet (1 mg total) by mouth 3 (three) times daily. 60 tablet 0  . fluticasone (FLONASE) 50 MCG/ACT nasal spray SHAKE LQ AND U 2 SPRAYS IEN QD  4  . lamoTRIgine (LAMICTAL) 150 MG tablet Take 2 tablets (300 mg total) by mouth daily. 60 tablet 1  . meloxicam (MOBIC) 7.5 MG tablet Take 7.5 mg by mouth 2 (two) times daily.  2  . metFORMIN (GLUCOPHAGE-XR) 750 MG 24 hr tablet Take 750 mg by mouth daily.  11  . oxcarbazepine (TRILEPTAL) 600 MG tablet Take 1 tablet (600 mg total) by mouth 2 (two) times daily. 60 tablet 1   No current facility-administered medications for this visit.      Musculoskeletal: Strength & Muscle Tone: within normal limits Gait & Station: normal Patient leans: N/A  Psychiatric Specialty Exam: Review of Systems  HENT: Negative.   Respiratory: Negative.    Cardiovascular: Negative.   Musculoskeletal: Negative.   Skin: Negative.  Negative for itching and rash.  Neurological: Positive for headaches.  Psychiatric/Behavioral: Positive for depression and hallucinations. The patient is nervous/anxious and has insomnia.     Blood pressure 136/82, pulse (!) 113, height 5\' 5"  (1.651 m), weight (!) 332 lb 3.2 oz (150.7 kg).There is no height or weight on file to calculate BMI.  General Appearance: Casual  Eye Contact:  Fair  Speech:  Clear and Coherent and fast  Volume:  Normal  Mood:  emotional  Affect:  Labile  Thought Process:  Descriptions of Associations: Circumstantial  Orientation:  Full (Time, Place, and Person)  Thought Content: Hallucinations: seeing things and hearing music and Rumination   Suicidal Thoughts:  No  Homicidal Thoughts:  No  Memory:  Immediate;   Fair Recent;   Fair Remote;   Fair  Judgement:  Good  Insight:  Good  Psychomotor Activity:  Increased  Concentration:  Concentration: Fair and Attention Span: Fair  Recall:  [employer] of Knowledge: Good  Language: Good  Akathisia:  No  Handed:  Right  AIMS (if indicated): not done  Assets:  Communication Skills Desire for Improvement Housing Resilience Social Support  ADL's:  Intact  Cognition: WNL  Sleep:  Fair   Screenings: GAD-7     Counselor from 04/04/2017 in [place] [facility] from 03/18/2017 in [place]  Total GAD-7 Score  7  18    PHQ2-9     Counselor from 04/04/2017 in [place] [facility] from 03/28/2017 in [place] [facility] from 03/18/2017 in [place]  PHQ-2 Total Score  2  4  5   PHQ-9 Total Score  10  15  18        Assessment and Plan: Bipolar disorder, mixed.  Anxiety disorder NOS.  Patient continues to struggle with his symptoms of bipolar  disorder and anxiety.  She tried Abilify just 2 weeks ago.  I will discontinue Trileptal since it is not working.  We will start low-dose lithium to see if she can tolerate the dose.  We will start Lithobid 450 mg at bedtime.  I encourage to take Klonopin 0.5 mg at night to help sleep and take 0.5 mg within day if needed for severe anxiety.  She has refills remaining on Klonopin and does not need a new prescription.  We will also increase Abilify 10 mg to help the mood lability and paranoia.  Taking Lamictal 150 mg twice a day.  She has no rash, itching, tremors or shakes.  We will try to get records from her psychiatrist who she used to see in 2016 and done genetic testing.  He is still awaiting records from Dr. Tomasita Crumble McKinney's office.  Encouraged to see Charolotte Eke for counseling.  Discussed medication side effects and benefits especially benzodiazepine dependence tolerance and withdrawal.  Recommended to call us back if she has any question, concern if she feels worsening of the symptoms.  Encouraged healthy lifestyle and watch her calorie intake and do regular exercise.  Time spent 25 minutes.  More than 50% of the time spent in psychoeducation, counseling, coordination of care and healthy lifestyle.  Also encouraged to see primary care physician for basic health needs.  I will see her again in 6 weeks.   Kathlee Nations, MD 06/20/2017, 10:11 AM

## 2017-07-02 ENCOUNTER — Ambulatory Visit (INDEPENDENT_AMBULATORY_CARE_PROVIDER_SITE_OTHER): Payer: BC Managed Care – PPO | Admitting: Licensed Clinical Social Worker

## 2017-07-02 ENCOUNTER — Encounter (HOSPITAL_COMMUNITY): Payer: Self-pay | Admitting: Licensed Clinical Social Worker

## 2017-07-02 DIAGNOSIS — F3162 Bipolar disorder, current episode mixed, moderate: Secondary | ICD-10-CM | POA: Diagnosis not present

## 2017-07-02 NOTE — Progress Notes (Signed)
   THERAPIST PROGRESS NOTE  Session Time:  9:10-10am  Participation Level: Active  Behavioral Response: CasualAlertAnxious  Type of Therapy: Individual Therapy  Treatment Goals addressed: Coping  Interventions: Supportive  Summary: Christine [name] is a 41 y.o. female who presents for her individual counseling session. Pt discussed her psychiatric symptoms and current life events. Pt brought in a book on baking. For the entire session pt focused on her desire to become a Child psychotherapist. She has begun experimenting, researching. She laughed, smiled and her eyes glistened in her discussion. Pointed out to pt that throughout the session she used self motivation, stress reliever, problem solving and didn't know it. Pt did not discuss any suicidal thoughts, desire for a higher level of care. Pt saw her psychiatrist and he added Lithium which pt says has improved her moods. Pt continues to use her mindfulness activities.          Suicidal/Homicidal: Nowithout intent/plan  Therapist Response: Assessed pt's current function by self report and reviewed progress. Assisted pt processing her psychiatric symptoms, focus on future plans, psychiatrist appt,  Assisted pt processing for the management of her stressors.   Plan: Return again in 2 weeks.  Diagnosis: Axis I:  Bipolar 1 Disorder, mixed moderate    Alaya Iverson S, LCAS 07/02/2017

## 2017-07-10 ENCOUNTER — Other Ambulatory Visit (HOSPITAL_COMMUNITY): Payer: Self-pay

## 2017-07-10 DIAGNOSIS — F3162 Bipolar disorder, current episode mixed, moderate: Secondary | ICD-10-CM

## 2017-07-10 MED ORDER — ABILIFY 10 MG PO TABS: 10.0000 mg | ORAL_TABLET | Freq: Every day | ORAL | 0 refills | Status: DC

## 2017-07-16 ENCOUNTER — Ambulatory Visit (HOSPITAL_COMMUNITY): Payer: Self-pay | Admitting: Licensed Clinical Social Worker

## 2017-07-18 ENCOUNTER — Telehealth (HOSPITAL_COMMUNITY): Payer: Self-pay

## 2017-07-18 DIAGNOSIS — F3162 Bipolar disorder, current episode mixed, moderate: Secondary | ICD-10-CM

## 2017-07-18 NOTE — Telephone Encounter (Signed)
Medication management - Prior authorization initiated on CoverMyMeds for patient's prescribed Abilify 10 mg, #90 for 90 days.  Decision should be rendered within 5 days and may have change to Aripiprazole formula.

## 2017-07-21 MED ORDER — ARIPIPRAZOLE 10 MG PO TABS: 10.0000 mg | ORAL_TABLET | Freq: Every day | ORAL | 0 refills | Status: DC

## 2017-07-21 NOTE — Telephone Encounter (Signed)
Okay to provide generic Abilify.

## 2017-07-21 NOTE — Telephone Encounter (Signed)
Medication management - Prior authorization initiated on CoverMyMeds for patient's prescribed Abilify 10 mg, #90 for 90 days.Decision should be rendered within 5 days and may have change to Aripiprazole formula. New Aripiprazole 10 mg, one a day, #90 with no refills e-scribed to patient's [facility] per order of [name] this date.

## 2017-07-21 NOTE — Telephone Encounter (Signed)
Medication management - Patient's Abilify 10 mg, one a day denied as namebrand but plan will cover Aripiprazole if change approved by provider.

## 2017-07-22 ENCOUNTER — Encounter (HOSPITAL_COMMUNITY): Payer: Self-pay | Admitting: Psychiatry

## 2017-07-22 ENCOUNTER — Ambulatory Visit (INDEPENDENT_AMBULATORY_CARE_PROVIDER_SITE_OTHER): Payer: BC Managed Care – PPO | Admitting: Psychiatry

## 2017-07-22 VITALS — BP 142/80 | HR 101 | Ht 65.0 in | Wt 332.2 lb

## 2017-07-22 DIAGNOSIS — Z79899 Other long term (current) drug therapy: Secondary | ICD-10-CM | POA: Diagnosis not present

## 2017-07-22 DIAGNOSIS — F3162 Bipolar disorder, current episode mixed, moderate: Secondary | ICD-10-CM | POA: Diagnosis not present

## 2017-07-22 DIAGNOSIS — Z818 Family history of other mental and behavioral disorders: Secondary | ICD-10-CM

## 2017-07-22 DIAGNOSIS — F411 Generalized anxiety disorder: Secondary | ICD-10-CM | POA: Diagnosis not present

## 2017-07-22 MED ORDER — LAMOTRIGINE 150 MG PO TABS: 300.0000 mg | ORAL_TABLET | Freq: Every day | ORAL | 1 refills | Status: DC

## 2017-07-22 MED ORDER — CLONAZEPAM 0.5 MG PO TABS: 0.5000 mg | ORAL_TABLET | Freq: Two times a day (BID) | ORAL | 0 refills | Status: DC | PRN

## 2017-07-22 MED ORDER — LITHIUM CARBONATE ER 450 MG PO TBCR: 450.0000 mg | EXTENDED_RELEASE_TABLET | Freq: Two times a day (BID) | ORAL | 1 refills | Status: DC

## 2017-07-22 NOTE — Progress Notes (Signed)
[place] MD/PA/NP OP Progress Note  07/22/2017 8:36 AM Christine [name]  MRN:  [id number]  Chief Complaint: I am not taking Abilify because I cannot afford it.  But I am doing good with the lithium.  HPI: Christine [name] came for her follow-up appointment.  She is not taking Abilify because she cannot afford the medication.  However she is feeling better with the lithium.  Her paranoia and hallucinations are less intense.  She is sleeping better but she continues to have mood lability, racing thoughts and irritability.  Her speech remains fast but coherent.  She is tolerating lithium and wondering if dose can be increased.  She remains nervous and anxious but overall she has cut down her Klonopin and taking Klonopin only as needed.  We are still waiting records from Christine [name]'s office.  Patient is scheduled to see her primary care physician tomorrow.  She is no longer taking metformin but she like to discuss with her physician tomorrow if she need to restart.  She is seeing Christine [name] for therapy.  Patient has history of headaches, non-elliptical seizures, polycystic ovary and obesity.  Patient denies drinking alcohol or using any illegal.  She lives with her husband and 2 adopted children.  She was unable to get records from 2016.  Patient told she had a genetic testing in 2016 when she was seeing a psychiatrist in [place].  Patient has no tremors, shakes or any EPS.  Her appetite is okay.  Her energy level is good.  Her vital signs are stable.  She has no rash, itching, tremors or shakes.   Visit Diagnosis:    ICD-10-CM   1. Bipolar disorder, current episode mixed, moderate (HCC) F31.62 lithium carbonate (ESKALITH) 450 MG CR tablet    lamoTRIgine (LAMICTAL) 150 MG tablet  2. Generalized anxiety disorder F41.1 clonazePAM (KLONOPIN) 0.5 MG tablet    Past Psychiatric History:  Patient had history of depression since age 85.  She tried antidepressant which makes her manic.  In the past she  had tried Risperdal, Seroquel, Abilify, lithium, Trileptal, Prozac, Paxil, Zoloft, Celexa, Lexapro, Effexor and Klonopin.  She has 3 hospitalization due to severe mood swings, impulsive behavior and suicidal thoughts.  She had a history of mania which includes severe impulsive behavior, excessive talking, poor sleep, distractibility and hypersexual behavior.  She has a history of cutting herself.  Her last hospitalization was in November 2017.  She also has done IOP and then PHP.  We tried [name] but make her more anxious.  Patient was seeing Dr. Letta [name].  Past Medical History:  Past Medical History:  Diagnosis Date  . Anemia   . Anxiety   . Bipolar 1 disorder ([place])   . Depression   . Fibroids   . High blood pressure   . Mental disorder   . PCOS (polycystic ovarian syndrome)     Past Surgical History:  Procedure Laterality Date  . DIAGNOSTIC LAPAROSCOPY    . DILATION AND CURETTAGE OF UTERUS    . HYSTEROSCOPY W/D&C  07/22/2011   Procedure: DILATATION AND CURETTAGE /HYSTEROSCOPY;  Surgeon: Christine Mourning, MD;  Location: [facility];  Service: Gynecology;;    Family Psychiatric History: Reviewed.  Family History:  Family History  Problem Relation Age of Onset  . Anxiety disorder Mother   . Depression Mother     Social History:  Social History   Socioeconomic History  . Marital status: Married    Spouse name: None  . Number of  children: None  . Years of education: None  . Highest education level: None  Social Needs  . Financial resource strain: None  . Food insecurity - worry: None  . Food insecurity - inability: None  . Transportation needs - medical: None  . Transportation needs - non-medical: None  Occupational History  . None  Tobacco Use  . Smoking status: Never Smoker  . Smokeless tobacco: Never Used  Substance and Sexual Activity  . Alcohol use: No    Comment: rare  . Drug use: No  . Sexual activity: Yes    Partners: Male    Birth control/protection:  None  Other Topics Concern  . None  Social History Narrative  . None    Allergies:  Allergies  Allergen Reactions  . Penicillins Rash    Metabolic Disorder Labs: No results found for: HGBA1C, MPG No results found for: PROLACTIN No results found for: CHOL, TRIG, HDL, CHOLHDL, VLDL, LDLCALC No results found for: TSH  Therapeutic Level Labs: No results found for: LITHIUM No results found for: VALPROATE No components found for:  CBMZ  Current Medications: Current Outpatient Medications  Medication Sig Dispense Refill  . amLODipine (NORVASC) 10 MG tablet Take 10 mg by mouth daily.  6  . ARIPiprazole (ABILIFY) 10 MG tablet Take 1 tablet (10 mg total) by mouth daily. 90 tablet 0  . clonazePAM (KLONOPIN) 0.5 MG tablet Take 0.5 mg 2 (two) times daily as needed by mouth.    . fluticasone (FLONASE) 50 MCG/ACT nasal spray SHAKE LQ AND U 2 SPRAYS IEN QD  4  . lamoTRIgine (LAMICTAL) 150 MG tablet Take 2 tablets (300 mg total) daily by mouth. 60 tablet 1  . lithium carbonate (ESKALITH) 450 MG CR tablet Take 1 tablet (450 mg total) daily by mouth. 30 tablet 1  . meloxicam (MOBIC) 7.5 MG tablet Take 7.5 mg by mouth 2 (two) times daily.  2  . metFORMIN (GLUCOPHAGE-XR) 750 MG 24 hr tablet Take 750 mg by mouth daily.  11   No current facility-administered medications for this visit.      Musculoskeletal: Strength & Muscle Tone: within normal limits Gait & Station: normal Patient leans: N/A  Psychiatric Specialty Exam: Review of Systems  Constitutional: Negative.   HENT: Negative.   Respiratory: Negative.   Cardiovascular: Negative.   Gastrointestinal: Negative.   Genitourinary: Negative.   Musculoskeletal: Negative.   Skin: Negative.   Neurological: Positive for headaches.  Psychiatric/Behavioral: The patient is nervous/anxious.     Blood pressure (!) 142/80, pulse (!) 101, height 5\' 5"  (1.651 m), weight (!) 332 lb 3.2 oz (150.7 kg).Body mass index is 55.28 kg/m.  General  Appearance: Casual  Eye Contact:  Fair  Speech:  Clear and Coherent and fast  Volume:  Normal  Mood:  emotional  Affect:  Labile  Thought Process:  Goal Directed  Orientation:  Full (Time, Place, and Person)  Thought Content: Rumination   Suicidal Thoughts:  No  Homicidal Thoughts:  No  Memory:  Immediate;   Fair Recent;   Fair Remote;   Fair  Judgement:  Good  Insight:  Good  Psychomotor Activity:  slightly increased  Concentration:  Concentration: Fair and Attention Span: Fair  Recall:  Good  Fund of Knowledge: Good  Language: Good  Akathisia:  No  Handed:  Right  AIMS (if indicated): not done  Assets:  Communication Skills Desire for Improvement Housing  ADL's:  Intact  Cognition: WNL  Sleep:  Fair   Screenings:  GAD-7     Counselor from 04/04/2017 in [place] [facility] from 03/18/2017 in [place]  Total GAD-7 Score  7  18    PHQ2-9     Counselor from 04/04/2017 in [place] Counselor from 03/28/2017 in [place] Counselor from 03/18/2017 in [place]  PHQ-2 Total Score  2  4  5   PHQ-9 Total Score  10  15  18        Assessment and Plan: Bipolar disorder type I.  Anxiety disorder NOS.  I had a long discussion with the patient about noncompliance with medication.  At this time patient cannot afford Abilify.  She was taking Abilify brand name with the help of coupon however when coupon expired she even cannot afford generic Abilify.  She like to try higher dose of lithium.  I would increase Lithobid 450 mg twice a day.  Discussed in length medication side effects and benefits.  Patient is scheduled to see her primary care physician at [facility] and I reminded that she should get a lithium level and blood work to check her kidney function test.  I also encouraged to continue  Christine [name] for counseling.  Reminded that she need to bring genetic testing results which was done in 2016.  We are still waiting records from Christine [name]'s office.  Patient has no rash, itching, tremors or shakes.  I will continue Lamictal 150 mg twice a day and Klonopin 0.5 mg as needed.  Recommended to call us back if she has any question, concern if she feels worsening of the symptoms.  Follow-up in 2 months.  Time spent 25 minutes.  More than 50% of the time spent in psychoeducation, counseling, coronation of care and long-term prognosis and risk of noncompliance with medication.     Kathlee Nations, MD 07/22/2017, 8:36 AM

## 2017-08-04 ENCOUNTER — Ambulatory Visit (HOSPITAL_COMMUNITY): Payer: Self-pay | Admitting: Licensed Clinical Social Worker

## 2017-08-04 ENCOUNTER — Telehealth: Payer: Self-pay | Admitting: Hematology and Oncology

## 2017-08-04 NOTE — Telephone Encounter (Signed)
Spoke with patient regarding appointment D/T/Loc/Ph# °

## 2017-08-05 DIAGNOSIS — Z9289 Personal history of other medical treatment: Secondary | ICD-10-CM

## 2017-08-05 HISTORY — PX: TRANSTHORACIC ECHOCARDIOGRAM: SHX275

## 2017-08-05 HISTORY — DX: Personal history of other medical treatment: Z92.89

## 2017-08-07 ENCOUNTER — Telehealth (HOSPITAL_COMMUNITY): Payer: Self-pay

## 2017-08-07 NOTE — Telephone Encounter (Signed)
Patient called and stated that her depression has gotten worse since her last visit. She was not taking Abilify due to not able to afford. [name] increased her Lithium and continued the Lamictal and Klonopin. Patient reports increased depression and crying spells. Please review and advise, thank you

## 2017-08-18 ENCOUNTER — Other Ambulatory Visit (HOSPITAL_COMMUNITY): Payer: Self-pay

## 2017-08-18 ENCOUNTER — Other Ambulatory Visit (HOSPITAL_COMMUNITY): Payer: Self-pay | Admitting: Psychiatry

## 2017-08-18 ENCOUNTER — Ambulatory Visit (INDEPENDENT_AMBULATORY_CARE_PROVIDER_SITE_OTHER): Payer: BC Managed Care – PPO | Admitting: Licensed Clinical Social Worker

## 2017-08-18 ENCOUNTER — Encounter (HOSPITAL_COMMUNITY): Payer: Self-pay | Admitting: Licensed Clinical Social Worker

## 2017-08-18 DIAGNOSIS — F3162 Bipolar disorder, current episode mixed, moderate: Secondary | ICD-10-CM

## 2017-08-18 DIAGNOSIS — Z79899 Other long term (current) drug therapy: Secondary | ICD-10-CM

## 2017-08-18 MED ORDER — BREXPIPRAZOLE 0.25 MG PO TABS: 0.5000 mg | ORAL_TABLET | Freq: Every day | ORAL | 0 refills | Status: DC

## 2017-08-18 NOTE — Addendum Note (Signed)
Addended by: Lethea Killings on: 08/18/2017 10:44 AM   Modules accepted: Orders

## 2017-08-18 NOTE — Telephone Encounter (Signed)
Spoke to [name] and he said that patient could start 0.5 mg of Risperdal, and if that was not good for the patient she could start [name] 0.5 mg. I talked to the patient and she said that the Risperdal never worked for her and she was open to starting the [place]. I gave her a 14 day starter pack and told her to call me and let me know how she was doing on it. I also gave the patient an order for a Lithium level.

## 2017-08-18 NOTE — Progress Notes (Signed)
   THERAPIST PROGRESS NOTE  Session Time:  9:10-10am  Participation Level: Active  Behavioral Response: CasualAlertAnxious  Type of Therapy: Individual Therapy  Treatment Goals addressed: Coping  Interventions: Supportive  Summary: Christine [name] is a 42 y.o. female who presents for her individual counseling session. Pt discussed her psychiatric symptoms and current life events. Pt present tearful and crying today about her continued depressive symptoms. She is unable to take the Abilify because she doesn't have the $ for the prescription. She feels that the Lithium is helping and she has no manic symptoms. Her depression started around the middle of December. SHe has not had her Lithium level checked since beginning Lithium in November. Pt reports she has no motivation, depressive symptoms, crying and cycling about 2x per week. She is not experiencing rapid cycling. [name] came in to discuss her symptoms and she will speak with [name] about her symptoms. She will also assist with the Lithium level as well. Discussed alternative programming for pt at this time Pt inquired about returning to [facility]. Spoke with PHP coordinator about her return to [facility]. Pt can return to [facility] if desired. Reviewed pt's tx plan and put it into Epic.    Suicidal/Homicidal: Nowithout intent/plan  Therapist Response: Assessed pt's current function by self report and reviewed progress. Assisted pt processing her psychiatric symptoms,  CMA spoke with pt who will speak with psychiatrist about pt's symptoms, discussed tx alternatives and spoke with [name] coordinator.  Assisted pt processing for the management of her stressors.   Plan: Return again in 2 weeks.  Diagnosis: Axis I:  Bipolar 1 Disorder, mixed moderate    Raedyn Klinck S, LCAS 08/18/2017

## 2017-08-26 ENCOUNTER — Encounter (HOSPITAL_COMMUNITY): Payer: Self-pay | Admitting: Licensed Clinical Social Worker

## 2017-08-26 ENCOUNTER — Other Ambulatory Visit (HOSPITAL_COMMUNITY): Payer: Self-pay | Admitting: Psychiatry

## 2017-08-26 ENCOUNTER — Ambulatory Visit (INDEPENDENT_AMBULATORY_CARE_PROVIDER_SITE_OTHER): Payer: BC Managed Care – PPO | Admitting: Licensed Clinical Social Worker

## 2017-08-26 DIAGNOSIS — F3162 Bipolar disorder, current episode mixed, moderate: Secondary | ICD-10-CM | POA: Diagnosis not present

## 2017-08-26 NOTE — Progress Notes (Signed)
   THERAPIST PROGRESS NOTE  Session Time:  9:10-10am  Participation Level: Active  Behavioral Response: CasualAlertAnxious  Type of Therapy: Individual Therapy  Treatment Goals addressed: Coping  Interventions: Supportive  Summary: Christine [name] is a 42 y.o. female who presents for her individual counseling session. Pt discussed her psychiatric symptoms and current life events. Pt presented less tearful today. She reports she still has some mood swings but continues to be depressed. Pt has not had her Lithium level checked. Talked with pt about the importance of her Lithium levels.  Pt is frustrated with her lack of being stable in her illness. Asked pt what is her biggest stressor: $. Her family continuously has financial issues. Asked open ended questions. [name] does not work, has never really worked. He has lots of reasons why he can't work. Pt continuously worries about $. She feels she will never become stabilized as long as she is always worried about $. Discussed alternatives.        Suicidal/Homicidal: Nowithout intent/plan  Therapist Response: Assessed pt's current function by self report and reviewed progress. Assisted pt processing her psychiatric symptoms,  Biggest stressor, inability to become more stable, alternatives.  Assisted pt processing for the management of her stressors.   Plan: Return again in 2 weeks.  Diagnosis: Axis I:  Bipolar 1 Disorder, mixed moderate    MACKENZIE,LISBETH S, LCAS 08/26/2017

## 2017-08-27 LAB — LITHIUM LEVEL: Lithium Lvl: 0.5 mmol/L — ABNORMAL LOW (ref 0.6–1.2)

## 2017-08-28 ENCOUNTER — Emergency Department (HOSPITAL_COMMUNITY): Payer: BC Managed Care – PPO

## 2017-08-28 ENCOUNTER — Other Ambulatory Visit: Payer: Self-pay

## 2017-08-28 ENCOUNTER — Observation Stay (HOSPITAL_COMMUNITY)
Admission: EM | Admit: 2017-08-28 | Discharge: 2017-08-29 | Disposition: A | Discharge status: Home / Self Care | Payer: BC Managed Care – PPO | Attending: Internal Medicine | Admitting: Internal Medicine

## 2017-08-28 ENCOUNTER — Encounter (HOSPITAL_COMMUNITY): Payer: Self-pay

## 2017-08-28 DIAGNOSIS — R079 Chest pain, unspecified: Secondary | ICD-10-CM | POA: Insufficient documentation

## 2017-08-28 DIAGNOSIS — F329 Major depressive disorder, single episode, unspecified: Secondary | ICD-10-CM

## 2017-08-28 DIAGNOSIS — Z79899 Other long term (current) drug therapy: Secondary | ICD-10-CM | POA: Insufficient documentation

## 2017-08-28 DIAGNOSIS — D219 Benign neoplasm of connective and other soft tissue, unspecified: Secondary | ICD-10-CM | POA: Diagnosis present

## 2017-08-28 DIAGNOSIS — I10 Essential (primary) hypertension: Secondary | ICD-10-CM | POA: Diagnosis not present

## 2017-08-28 DIAGNOSIS — R509 Fever, unspecified: Secondary | ICD-10-CM | POA: Diagnosis not present

## 2017-08-28 DIAGNOSIS — F419 Anxiety disorder, unspecified: Secondary | ICD-10-CM | POA: Diagnosis not present

## 2017-08-28 DIAGNOSIS — Z7901 Long term (current) use of anticoagulants: Secondary | ICD-10-CM | POA: Diagnosis not present

## 2017-08-28 DIAGNOSIS — D649 Anemia, unspecified: Secondary | ICD-10-CM | POA: Diagnosis present

## 2017-08-28 DIAGNOSIS — Z880 Allergy status to penicillin: Secondary | ICD-10-CM | POA: Diagnosis not present

## 2017-08-28 DIAGNOSIS — N920 Excessive and frequent menstruation with regular cycle: Secondary | ICD-10-CM | POA: Insufficient documentation

## 2017-08-28 DIAGNOSIS — F319 Bipolar disorder, unspecified: Secondary | ICD-10-CM | POA: Diagnosis not present

## 2017-08-28 DIAGNOSIS — D62 Acute posthemorrhagic anemia: Secondary | ICD-10-CM | POA: Diagnosis not present

## 2017-08-28 DIAGNOSIS — D259 Leiomyoma of uterus, unspecified: Secondary | ICD-10-CM | POA: Insufficient documentation

## 2017-08-28 DIAGNOSIS — F418 Other specified anxiety disorders: Secondary | ICD-10-CM | POA: Diagnosis present

## 2017-08-28 DIAGNOSIS — E282 Polycystic ovarian syndrome: Secondary | ICD-10-CM | POA: Diagnosis not present

## 2017-08-28 LAB — CBC WITH DIFFERENTIAL/PLATELET
Basophils Absolute: 0 10*3/uL (ref 0.0–0.1)
Basophils Relative: 0 %
Eosinophils Absolute: 0.1 10*3/uL (ref 0.0–0.7)
Eosinophils Relative: 1 %
HCT: 23.4 % — ABNORMAL LOW (ref 36.0–46.0)
Hemoglobin: 6.6 g/dL — CL (ref 12.0–15.0)
Lymphocytes Relative: 15 %
Lymphs Abs: 1.4 10*3/uL (ref 0.7–4.0)
MCH: 18.7 pg — ABNORMAL LOW (ref 26.0–34.0)
MCHC: 28.2 g/dL — ABNORMAL LOW (ref 30.0–36.0)
MCV: 66.3 fL — ABNORMAL LOW (ref 78.0–100.0)
Monocytes Absolute: 0.8 10*3/uL (ref 0.1–1.0)
Monocytes Relative: 8 %
Neutro Abs: 7.3 10*3/uL (ref 1.7–7.7)
Neutrophils Relative %: 76 %
Platelets: 401 10*3/uL — ABNORMAL HIGH (ref 150–400)
RBC: 3.53 MIL/uL — ABNORMAL LOW (ref 3.87–5.11)
RDW: 20.2 % — ABNORMAL HIGH (ref 11.5–15.5)
WBC: 9.6 10*3/uL (ref 4.0–10.5)

## 2017-08-28 LAB — COMPREHENSIVE METABOLIC PANEL
ALT: 16 U/L (ref 14–54)
AST: 14 U/L — ABNORMAL LOW (ref 15–41)
Albumin: 4.1 g/dL (ref 3.5–5.0)
Alkaline Phosphatase: 63 U/L (ref 38–126)
Anion gap: 9 (ref 5–15)
BUN: 9 mg/dL (ref 6–20)
CO2: 24 mmol/L (ref 22–32)
Calcium: 9.1 mg/dL (ref 8.9–10.3)
Chloride: 105 mmol/L (ref 101–111)
Creatinine, Ser: 0.79 mg/dL (ref 0.44–1.00)
GFR calc Af Amer: >60 mL/min (ref >60)
GFR calc non Af Amer: >60 mL/min (ref >60)
Glucose, Bld: 89 mg/dL (ref 65–99)
Potassium: 3.8 mmol/L (ref 3.5–5.1)
Sodium: 138 mmol/L (ref 135–145)
Total Bilirubin: 0.3 mg/dL (ref 0.3–1.2)
Total Protein: 7.4 g/dL (ref 6.5–8.1)

## 2017-08-28 LAB — INFLUENZA PANEL BY PCR (TYPE A & B)
Influenza A By PCR: NEGATIVE
Influenza B By PCR: NEGATIVE

## 2017-08-28 LAB — VITAMIN B12: Vitamin B-12: 372 pg/mL (ref 180–914)

## 2017-08-28 LAB — PREPARE RBC (CROSSMATCH)

## 2017-08-28 LAB — URINALYSIS, ROUTINE W REFLEX MICROSCOPIC
Bilirubin Urine: NEGATIVE
Glucose, UA: NEGATIVE mg/dL
Hgb urine dipstick: NEGATIVE
Ketones, ur: NEGATIVE mg/dL
Leukocytes, UA: NEGATIVE
Nitrite: NEGATIVE
Protein, ur: NEGATIVE mg/dL
Specific Gravity, Urine: 1.006 (ref 1.005–1.030)
pH: 8 (ref 5.0–8.0)

## 2017-08-28 LAB — ABO/RH: ABO/RH(D): O POS

## 2017-08-28 LAB — RETICULOCYTES
RBC.: 3.39 MIL/uL — ABNORMAL LOW (ref 3.87–5.11)
Retic Count, Absolute: 98.3 10*3/uL (ref 19.0–186.0)
Retic Ct Pct: 2.9 % (ref 0.4–3.1)

## 2017-08-28 LAB — IRON AND TIBC
Iron: 28 ug/dL (ref 28–170)
Saturation Ratios: 7 % — ABNORMAL LOW (ref 10.4–31.8)
TIBC: 428 ug/dL (ref 250–450)
UIBC: 400 ug/dL

## 2017-08-28 LAB — TROPONIN I: Troponin I: <0.03 ng/mL (ref <0.03)

## 2017-08-28 LAB — FERRITIN: Ferritin: 2 ng/mL — ABNORMAL LOW (ref 11–307)

## 2017-08-28 LAB — I-STAT BETA HCG BLOOD, ED (MC, WL, AP ONLY): I-stat hCG, quantitative: <5 m[IU]/mL (ref <5)

## 2017-08-28 LAB — FOLATE: Folate: 16.5 ng/mL (ref >5.9)

## 2017-08-28 LAB — MAGNESIUM: Magnesium: 1.9 mg/dL (ref 1.7–2.4)

## 2017-08-28 MED ORDER — SODIUM CHLORIDE 0.9 % IV SOLN: Freq: Once | INTRAVENOUS | Status: DC

## 2017-08-28 MED ORDER — SODIUM CHLORIDE 0.9 % IV SOLN
Freq: Once | INTRAVENOUS | Status: AC
  Administered 2017-08-28: 16:00:00 via INTRAVENOUS

## 2017-08-28 MED ORDER — FLUTICASONE PROPIONATE 50 MCG/ACT NA SUSP
2.0000 | Freq: Every day | NASAL | Status: DC
Start: 2017-08-28 — End: 2017-08-29
  Administered 2017-08-29: 2 via NASAL
  Filled 2017-08-28: qty 16

## 2017-08-28 MED ORDER — DIPHENHYDRAMINE HCL 25 MG PO CAPS
25.0000 mg | ORAL_CAPSULE | Freq: Once | ORAL | Status: AC
Start: 2017-08-28 — End: 2017-08-28
  Administered 2017-08-28: 25 mg via ORAL
  Filled 2017-08-28: qty 1

## 2017-08-28 MED ORDER — SENNOSIDES-DOCUSATE SODIUM 8.6-50 MG PO TABS: 1.0000 | ORAL_TABLET | Freq: Every evening | ORAL | Status: DC | PRN

## 2017-08-28 MED ORDER — SORBITOL 70 % SOLN
30.0000 mL | Freq: Every day | Status: DC | PRN
  Filled 2017-08-28: qty 30

## 2017-08-28 MED ORDER — ALBUTEROL SULFATE (2.5 MG/3ML) 0.083% IN NEBU: 2.5000 mg | INHALATION_SOLUTION | RESPIRATORY_TRACT | Status: DC | PRN

## 2017-08-28 MED ORDER — ONDANSETRON HCL 4 MG PO TABS: 4.0000 mg | ORAL_TABLET | Freq: Four times a day (QID) | ORAL | Status: DC | PRN

## 2017-08-28 MED ORDER — IBUPROFEN 200 MG PO TABS: 400.0000 mg | ORAL_TABLET | Freq: Two times a day (BID) | ORAL | Status: DC | PRN

## 2017-08-28 MED ORDER — CLONAZEPAM 0.5 MG PO TABS: 0.5000 mg | ORAL_TABLET | Freq: Two times a day (BID) | ORAL | Status: DC | PRN

## 2017-08-28 MED ORDER — AMLODIPINE BESYLATE 10 MG PO TABS: 10.0000 mg | ORAL_TABLET | Freq: Every day | ORAL | Status: DC

## 2017-08-28 MED ORDER — FUROSEMIDE 10 MG/ML IJ SOLN
20.0000 mg | Freq: Once | INTRAMUSCULAR | Status: AC
  Administered 2017-08-28: 20 mg via INTRAVENOUS
  Filled 2017-08-28: qty 2

## 2017-08-28 MED ORDER — AMLODIPINE BESYLATE 10 MG PO TABS
10.0000 mg | ORAL_TABLET | Freq: Every day | ORAL | Status: DC
  Administered 2017-08-29: 10 mg via ORAL
  Filled 2017-08-28: qty 1

## 2017-08-28 MED ORDER — ACETAMINOPHEN 650 MG RE SUPP
650.0000 mg | Freq: Four times a day (QID) | RECTAL | Status: DC | PRN
Start: 2017-08-28 — End: 2017-08-29

## 2017-08-28 MED ORDER — ACETAMINOPHEN 500 MG PO TABS
1000.0000 mg | ORAL_TABLET | Freq: Once | ORAL | Status: AC
  Administered 2017-08-28: 1000 mg via ORAL
  Filled 2017-08-28: qty 2

## 2017-08-28 MED ORDER — FLEET ENEMA 7-19 GM/118ML RE ENEM: 1.0000 | ENEMA | Freq: Once | RECTAL | Status: DC | PRN

## 2017-08-28 MED ORDER — BREXPIPRAZOLE 1 MG PO TABS
0.5000 mg | ORAL_TABLET | Freq: Every day | ORAL | Status: DC
  Administered 2017-08-29: 0.5 mg via ORAL
  Filled 2017-08-28: qty 1

## 2017-08-28 MED ORDER — BREXPIPRAZOLE 0.25 MG PO TABS: 0.5000 mg | ORAL_TABLET | Freq: Every day | ORAL | Status: DC

## 2017-08-28 MED ORDER — LAMOTRIGINE 100 MG PO TABS
300.0000 mg | ORAL_TABLET | Freq: Every day | ORAL | Status: DC
  Administered 2017-08-29: 300 mg via ORAL
  Filled 2017-08-28: qty 3

## 2017-08-28 MED ORDER — ONDANSETRON HCL 4 MG/2ML IJ SOLN: 4.0000 mg | Freq: Four times a day (QID) | INTRAMUSCULAR | Status: DC | PRN

## 2017-08-28 MED ORDER — LITHIUM CARBONATE ER 450 MG PO TBCR
450.0000 mg | EXTENDED_RELEASE_TABLET | Freq: Two times a day (BID) | ORAL | Status: DC
  Administered 2017-08-28 – 2017-08-29 (×2): 450 mg via ORAL
  Filled 2017-08-28 (×3): qty 1

## 2017-08-28 MED ORDER — PANTOPRAZOLE SODIUM 40 MG PO TBEC
40.0000 mg | DELAYED_RELEASE_TABLET | Freq: Every day | ORAL | Status: DC
  Filled 2017-08-28: qty 1

## 2017-08-28 MED ORDER — ACETAMINOPHEN 325 MG PO TABS
650.0000 mg | ORAL_TABLET | Freq: Four times a day (QID) | ORAL | Status: DC | PRN
Start: 2017-08-28 — End: 2017-08-29

## 2017-08-28 MED ORDER — MELOXICAM 7.5 MG PO TABS
7.5000 mg | ORAL_TABLET | Freq: Two times a day (BID) | ORAL | Status: DC
  Administered 2017-08-28 – 2017-08-29 (×2): 7.5 mg via ORAL
  Filled 2017-08-28 (×3): qty 1

## 2017-08-28 NOTE — ED Notes (Signed)
Bed: WA06 Expected date:  Expected time:  Means of arrival:  Comments: EMS-low hgB

## 2017-08-28 NOTE — ED Notes (Signed)
ED TO INPATIENT HANDOFF REPORT  Name/Age/Gender Christine [name] 42 y.o. female  Code Status Code Status History    This patient does not have a recorded code status. Please follow your organizational policy for patients in this situation.      Home/SNF/Other Home  Chief Complaint Low Hgl  Level of Care/Admitting Diagnosis ED Disposition    ED Disposition Condition Comment   Admit  Hospital Area: [facility] [[id number]]  Level of Care: Telemetry [5]  Admit to tele based on following criteria: Complex arrhythmia (Bradycardia/Tachycardia)  Diagnosis: Symptomatic anemia [5176160]  Admitting Physician: Eugenie Filler [[id number]]  Attending Physician: Eugenie Filler [[id number]]  Bed request comments: 5 EAST PLEASE  PT Class (Do Not Modify): Observation [104]  PT Acc Code (Do Not Modify): Observation [10022]       Medical History Past Medical History:  Diagnosis Date  . Anemia   . Anxiety   . Bipolar 1 disorder ([place])   . Depression   . Fibroids   . High blood pressure   . Mental disorder   . PCOS (polycystic ovarian syndrome)     Allergies Allergies  Allergen Reactions  . Penicillins Rash    Has patient had a PCN reaction causing immediate rash, facial/tongue/throat swelling, SOB or lightheadedness with hypotension: Yes Has patient had a PCN reaction causing severe rash involving mucus membranes or skin necrosis: Unknown Has patient had a PCN reaction that required hospitalization: No Has patient had a PCN reaction occurring within the last 10 years: No If all of the above answers are "NO", then may proceed with Cephalosporin use.     IV Location/Drains/Wounds Patient Lines/Drains/Airways Status   Active Line/Drains/Airways    Name:   Placement date:   Placement time:   Site:   Days:   Peripheral IV 07/22/11 Left Hand   07/22/11    1310    Hand   2229   Peripheral IV 08/28/17 Right Antecubital   08/28/17    1351    Antecubital   less than 1    Peripheral IV 08/28/17 Left Antecubital   08/28/17    1545    Antecubital   less than 1   Urethral Catheter Straight-tip 16 Fr.   07/22/11    1401    Straight-tip   2229   Incision 07/22/11 Perineum Other (Comment)   07/22/11    1441     2229          Labs/Imaging Results for orders placed or performed during the hospital encounter of 08/28/17 (from the past 48 hour(s))  Urinalysis, Routine w reflex microscopic     Status: Abnormal   Collection Time: 08/28/17  1:19 PM  Result Value Ref Range   Color, Urine STRAW (A) YELLOW   APPearance CLEAR CLEAR   Specific Gravity, Urine 1.006 1.005 - 1.030   pH 8.0 5.0 - 8.0   Glucose, UA NEGATIVE NEGATIVE mg/dL   Hgb urine dipstick NEGATIVE NEGATIVE   Bilirubin Urine NEGATIVE NEGATIVE   Ketones, ur NEGATIVE NEGATIVE mg/dL   Protein, ur NEGATIVE NEGATIVE mg/dL   Nitrite NEGATIVE NEGATIVE   Leukocytes, UA NEGATIVE NEGATIVE  Type and screen [place]     Status: None (Preliminary result)   Collection Time: 08/28/17  1:35 PM  Result Value Ref Range   ABO/RH(D) O POS    Antibody Screen NEG    Sample Expiration 08/31/2017    Unit Number [id number]    Blood Component Type RED  CELLS,LR    Unit division 00    Status of Unit ISSUED    Transfusion Status OK TO TRANSFUSE    Crossmatch Result Compatible   ABO/Rh     Status: None   Collection Time: 08/28/17  1:35 PM  Result Value Ref Range   ABO/RH(D) O POS   CBC with Differential     Status: Abnormal   Collection Time: 08/28/17  1:36 PM  Result Value Ref Range   WBC 9.6 4.0 - 10.5 K/uL   RBC 3.53 (L) 3.87 - 5.11 MIL/uL   Hemoglobin 6.6 (LL) 12.0 - 15.0 g/dL    Comment: REPEATED TO VERIFY CRITICAL RESULT CALLED TO, READ BACK BY AND VERIFIED WITH: STANHOPE,M @ 1459 ON [id number] BY POTEAT,S    HCT 23.4 (L) 36.0 - 46.0 %   MCV 66.3 (L) 78.0 - 100.0 fL   MCH 18.7 (L) 26.0 - 34.0 pg   MCHC 28.2 (L) 30.0 - 36.0 g/dL   RDW 20.2 (H) 11.5 - 15.5 %   Platelets 401 (H) 150 - 400  K/uL   Neutrophils Relative % 76 %   Lymphocytes Relative 15 %   Monocytes Relative 8 %   Eosinophils Relative 1 %   Basophils Relative 0 %   Neutro Abs 7.3 1.7 - 7.7 K/uL   Lymphs Abs 1.4 0.7 - 4.0 K/uL   Monocytes Absolute 0.8 0.1 - 1.0 K/uL   Eosinophils Absolute 0.1 0.0 - 0.7 K/uL   Basophils Absolute 0.0 0.0 - 0.1 K/uL   RBC Morphology POLYCHROMASIA PRESENT     Comment: TARGET CELLS Spherocytes present   Comprehensive metabolic panel     Status: Abnormal   Collection Time: 08/28/17  1:36 PM  Result Value Ref Range   Sodium 138 135 - 145 mmol/L   Potassium 3.8 3.5 - 5.1 mmol/L   Chloride 105 101 - 111 mmol/L   CO2 24 22 - 32 mmol/L   Glucose, Bld 89 65 - 99 mg/dL   BUN 9 6 - 20 mg/dL   Creatinine, Ser 0.79 0.44 - 1.00 mg/dL   Calcium 9.1 8.9 - 10.3 mg/dL   Total Protein 7.4 6.5 - 8.1 g/dL   Albumin 4.1 3.5 - 5.0 g/dL   AST 14 (L) 15 - 41 U/L   ALT 16 14 - 54 U/L   Alkaline Phosphatase 63 38 - 126 U/L   Total Bilirubin 0.3 0.3 - 1.2 mg/dL   GFR calc non Af Amer >60 >60 mL/min   GFR calc Af Amer >60 >60 mL/min    Comment: (NOTE) The eGFR has been calculated using the CKD EPI equation. This calculation has not been validated in all clinical situations. eGFR's persistently <60 mL/min signify possible Chronic Kidney Disease.    Anion gap 9 5 - 15  I-Stat Beta hCG blood, ED ([facility], [facility], [facility] only)     Status: None   Collection Time: 08/28/17  1:50 PM  Result Value Ref Range   I-stat hCG, quantitative <5.0 <5 mIU/mL   Comment 3            Comment:   GEST. AGE      CONC.  (mIU/mL)   <=1 WEEK        5 - 50     2 WEEKS       50 - 500     3 WEEKS       100 - 10,000     4 WEEKS     1,000 -  30,000        FEMALE AND NON-PREGNANT FEMALE:     LESS THAN 5 mIU/mL   Reticulocytes     Status: Abnormal   Collection Time: 08/28/17  2:22 PM  Result Value Ref Range   Retic Ct Pct 2.9 0.4 - 3.1 %   RBC. 3.39 (L) 3.87 - 5.11 MIL/uL   Retic Count, Absolute 98.3 19.0 - 186.0 K/uL   Prepare RBC     Status: None   Collection Time: 08/28/17  4:00 PM  Result Value Ref Range   Order Confirmation ORDER PROCESSED BY BLOOD BANK    Dg Chest 2 View  Result Date: 08/28/2017 CLINICAL DATA:  Shortness of breath and fatigue. EXAM: CHEST  2 VIEW COMPARISON:  08/10/2012 FINDINGS: The heart size and mediastinal contours are within normal limits. Both lungs are clear. The visualized skeletal structures are unremarkable. IMPRESSION: No active cardiopulmonary disease. Electronically Signed   By: Markus Daft M.D.   On: 08/28/2017 13:33    Pending Labs Unresulted Labs (From admission, onward)   Start     Ordered   08/28/17 1557  Influenza panel by PCR (type A & B)  (Influenza PCR Panel)  Once,   R     08/28/17 1556   08/28/17 1537  Urine culture  STAT,   STAT     08/28/17 1536   08/28/17 1403  Vitamin B12  (Anemia Panel (PNL))  STAT,   STAT     08/28/17 1402   08/28/17 1403  Folate  (Anemia Panel (PNL))  STAT,   STAT     08/28/17 1402   08/28/17 1403  Iron and TIBC  (Anemia Panel (PNL))  STAT,   STAT     08/28/17 1402   08/28/17 1403  Ferritin  (Anemia Panel (PNL))  STAT,   STAT     08/28/17 1402      Vitals/Pain Today's Vitals   08/28/17 1605 08/28/17 1606 08/28/17 1606 08/28/17 1610  BP: (!) 141/92 (!) 137/91 (!) 137/91   Pulse: 96 95 95 98  Resp: 14 13 (!) 26 (!) 28  Temp:   100.1 F (37.8 C)   TempSrc:   Oral   SpO2: 94% 94% 95%   Weight:      Height:      PainSc:        Isolation Precautions Droplet precaution  Medications Medications  diphenhydrAMINE (BENADRYL) capsule 25 mg (not administered)  0.9 %  sodium chloride infusion ( Intravenous New Bag/Given 08/28/17 1547)  acetaminophen (TYLENOL) tablet 1,000 mg (1,000 mg Oral Given 08/28/17 1558)    Mobility walks

## 2017-08-28 NOTE — ED Triage Notes (Signed)
Pt arrives from [facility] physician office c/o increased generalized weakness, dizziness, shortness of breath x3 days. Hx of heavy menstrual periods, anemia, and iron transfusions. Hgb was 5.7 at PCP. Pt A/Ox4, speaking in full sentences.

## 2017-08-28 NOTE — ED Provider Notes (Addendum)
[facility] Provider Note   CSN: [id number] Arrival date & time: 08/28/17  1150     History   Chief Complaint Chief Complaint  Patient presents with  . Weakness  . Dizziness    HPI Christine [name] is a 42 y.o. female.  HPI   Last Monday developed fatigue, dyspnea. Was on menses and at first thought it was that but it continued.  Dizziness. Lightheadedness, not sure if syncope, doesn't think so.  Hx of fibroids, has had iron infusions for anemia before.  Was bleeding for 10 days this time, wearing 4-5 super plus pads. Hx of anemia in the past.   Just set up with OB next week.  Not on blood thinners.  Vaginal bleeding stopped yesterday.  No black or bloody stools or vomiting blood.   No fevers. Reports headache developing now, thinks from not eating. Husband had a 24hr bug recently where he felt fatigued and had headache. Reports dyspnea,with some tightness. No other chest pain. No significant cough.     Past Medical History:  Diagnosis Date  . Anemia   . Anxiety   . Bipolar 1 disorder ([place])   . Depression   . Fibroids   . High blood pressure   . Mental disorder   . PCOS (polycystic ovarian syndrome)     Patient Active Problem List   Diagnosis Date Noted  . Symptomatic anemia 08/28/2017  . Acute blood loss anemia 08/28/2017  . Bipolar disorder ([place]) 08/28/2017  . Anxiety 08/28/2017  . Depression 08/28/2017  . Fibroids 08/28/2017  . PCOS (polycystic ovarian syndrome) 08/28/2017  . Fever 08/28/2017    Past Surgical History:  Procedure Laterality Date  . DIAGNOSTIC LAPAROSCOPY    . DILATION AND CURETTAGE OF UTERUS    . HYSTEROSCOPY W/D&C  07/22/2011   Procedure: DILATATION AND CURETTAGE /HYSTEROSCOPY;  Surgeon: Cyril Mourning, MD;  Location: [facility];  Service: Gynecology;;    OB History    No data available       Home Medications    Prior to Admission medications   Medication Sig Start Date End Date Taking?  Authorizing Provider  amLODipine (NORVASC) 10 MG tablet Take 10 mg by mouth daily. 12/31/16  Yes [provider]  Brexpiprazole ([name]) 0.25 MG TABS Take 0.5 mg by mouth daily. 08/18/17  Yes [name], [name], MD  clonazePAM (KLONOPIN) 0.5 MG tablet Take 1 tablet (0.5 mg total) by mouth 2 (two) times daily as needed. 07/22/17  Yes [name], [name], MD  fluticasone (FLONASE) 50 MCG/ACT nasal spray SHAKE LQ AND U 2 SPRAYS IEN QD 12/31/16  Yes [provider]  ibuprofen (ADVIL,MOTRIN) 200 MG tablet Take 400 mg by mouth 2 (two) times daily as needed for headache.   Yes [provider]  lamoTRIgine (LAMICTAL) 150 MG tablet Take 2 tablets (300 mg total) by mouth daily. 07/22/17  Yes [name], [name], MD  lithium carbonate (ESKALITH) 450 MG CR tablet Take 1 tablet (450 mg total) by mouth 2 (two) times daily. 07/22/17 07/22/18 Yes [name], [name], MD  meloxicam (MOBIC) 7.5 MG tablet Take 7.5 mg by mouth 2 (two) times daily. 12/31/16  Yes [provider]  ARIPiprazole (ABILIFY) 10 MG tablet Take 1 tablet (10 mg total) by mouth daily. Patient not taking: Reported on 08/28/2017 07/21/17 07/21/18  Kathlee Nations, MD    Family History Family History  Problem Relation Age of Onset  . Anxiety disorder Mother   .  Depression Mother     Social History Social History   Tobacco Use  . Smoking status: Never Smoker  . Smokeless tobacco: Never Used  Substance Use Topics  . Alcohol use: No    Comment: rare  . Drug use: No     Allergies   Penicillins   Review of Systems Review of Systems  Constitutional: Positive for fatigue. Negative for fever.  HENT: Negative for sore throat.   Eyes: Negative for visual disturbance.  Respiratory: Positive for shortness of breath. Negative for cough.   Cardiovascular: Negative for chest pain.  Gastrointestinal: Negative for abdominal pain, nausea and vomiting.  Genitourinary: Negative for difficulty urinating.  Musculoskeletal: Negative  for back pain and neck pain.  Skin: Negative for rash.  Neurological: Positive for light-headedness and headaches. Negative for syncope.     Physical Exam Updated Vital Signs BP (!) 156/96   Pulse (!) 104   Temp 99 F (37.2 C) (Oral)   Resp (!) 25   Ht 5\' 5"  (1.651 m)   Wt (!) 153.3 kg (338 lb)   LMP 08/18/2017   SpO2 100%   BMI 56.25 kg/m   Physical Exam  Constitutional: She is oriented to person, place, and time. She appears well-developed and well-nourished. No distress.  HENT:  Head: Normocephalic and atraumatic.  Eyes: Conjunctivae and EOM are normal.  Neck: Normal range of motion.  Cardiovascular: Normal rate, regular rhythm, normal heart sounds and intact distal pulses. Exam reveals no gallop and no friction rub.  No murmur heard. Pulmonary/Chest: Effort normal and breath sounds normal. No respiratory distress. She has no wheezes. She has no rales.  Abdominal: Soft. She exhibits no distension. There is no tenderness. There is no guarding.  Musculoskeletal: She exhibits no edema or tenderness.  Neurological: She is alert and oriented to person, place, and time.  Skin: Skin is warm and dry. No rash noted. She is not diaphoretic. No erythema.  Nursing note and vitals reviewed.    ED Treatments / Results  Labs (all labs ordered are listed, but only abnormal results are displayed) Labs Reviewed  CBC WITH DIFFERENTIAL/PLATELET - Abnormal; Notable for the following components:      Result Value   RBC 3.53 (*)    Hemoglobin 6.6 (*)    HCT 23.4 (*)    MCV 66.3 (*)    MCH 18.7 (*)    MCHC 28.2 (*)    RDW 20.2 (*)    Platelets 401 (*)    All other components within normal limits  COMPREHENSIVE METABOLIC PANEL - Abnormal; Notable for the following components:   AST 14 (*)    All other components within normal limits  URINALYSIS, ROUTINE W REFLEX MICROSCOPIC - Abnormal; Notable for the following components:   Color, Urine STRAW (*)    All other components within  normal limits  RETICULOCYTES - Abnormal; Notable for the following components:   RBC. 3.39 (*)    All other components within normal limits  URINE CULTURE  VITAMIN B12  FOLATE  IRON AND TIBC  FERRITIN  INFLUENZA PANEL BY PCR (TYPE A & B)  HIV ANTIBODY (ROUTINE TESTING)  MAGNESIUM  BASIC METABOLIC PANEL  CBC  TROPONIN I  TROPONIN I  TROPONIN I  I-STAT BETA HCG BLOOD, ED ([facility], [facility], [facility] ONLY)  TYPE AND SCREEN  ABO/RH  PREPARE RBC (CROSSMATCH)  PREPARE RBC (CROSSMATCH)    EKG  EKG Interpretation  Date/Time:  Thursday August 28 2017 13:20:36 EST Ventricular Rate:  108  PR Interval:    QRS Duration: 87 QT Interval:  324 QTC Calculation: 435 R Axis:   62 Text Interpretation:  Sinus tachycardia Probable left atrial enlargement No previous ECGs available Confirmed by Gareth Morgan [phone]) on 08/28/2017 1:58:35 PM       Radiology Dg Chest 2 View  Result Date: 08/28/2017 CLINICAL DATA:  Shortness of breath and fatigue. EXAM: CHEST  2 VIEW COMPARISON:  08/10/2012 FINDINGS: The heart size and mediastinal contours are within normal limits. Both lungs are clear. The visualized skeletal structures are unremarkable. IMPRESSION: No active cardiopulmonary disease. Electronically Signed   By: Markus Daft M.D.   On: 08/28/2017 13:33    Procedures .Critical Care Performed by: Gareth Morgan, MD Authorized by: Gareth Morgan, MD   Critical care provider statement:    Critical care time (minutes):  30 Comments:     CRITICAL CARE: anemia requiring transfusion in ED Performed by: Tennis Must   Total critical care time: 30 minutes  Critical care time was exclusive of separately billable procedures and treating other patients.  Critical care was necessary to treat or prevent imminent or life-threatening deterioration.  Critical care was time spent personally by me on the following activities: development of treatment plan with patient and/or surrogate as well as nursing,  discussions with consultants, evaluation of patient's response to treatment, examination of patient, obtaining history from patient or surrogate, ordering and performing treatments and interventions, ordering and review of laboratory studies, ordering and review of radiographic studies, pulse oximetry and re-evaluation of patient's condition.    (including critical care time)  Medications Ordered in ED Medications  diphenhydrAMINE (BENADRYL) capsule 25 mg (not administered)  amLODipine (NORVASC) tablet 10 mg (not administered)  clonazePAM (KLONOPIN) tablet 0.5 mg (not administered)  fluticasone (FLONASE) 50 MCG/ACT nasal spray 2 spray (not administered)  ibuprofen (ADVIL,MOTRIN) tablet 400 mg (not administered)  lamoTRIgine (LAMICTAL) tablet 300 mg (not administered)  lithium carbonate (ESKALITH) CR tablet 450 mg (not administered)  meloxicam (MOBIC) tablet 7.5 mg (not administered)  pantoprazole (PROTONIX) EC tablet 40 mg (not administered)  acetaminophen (TYLENOL) tablet 650 mg (not administered)    Or  acetaminophen (TYLENOL) suppository 650 mg (not administered)  senna-docusate (Senokot-S) tablet 1 tablet (not administered)  sorbitol 70 % solution 30 mL (not administered)  sodium phosphate (FLEET) 7-19 GM/118ML enema 1 enema (not administered)  ondansetron (ZOFRAN) tablet 4 mg (not administered)    Or  ondansetron (ZOFRAN) injection 4 mg (not administered)  albuterol (PROVENTIL) (2.5 MG/3ML) 0.083% nebulizer solution 2.5 mg (not administered)  0.9 %  sodium chloride infusion (not administered)  furosemide (LASIX) injection 20 mg (not administered)  Brexpiprazole TABS 0.5 mg (not administered)  0.9 %  sodium chloride infusion ( Intravenous New Bag/Given 08/28/17 1547)  acetaminophen (TYLENOL) tablet 1,000 mg (1,000 mg Oral Given 08/28/17 1558)     Initial Impression / Assessment and Plan / ED Course  I have reviewed the triage vital signs and the nursing notes.  Pertinent labs &  imaging results that were available during my care of the patient were reviewed by me and considered in my medical decision making (see chart for details).     42yo female with history of htn, bipolar, anemia, fibroids, PCOS, menorrhagia, presents with concern for generalized weakness and anemia.  Hgb 5.7 at PCP, is 6.6 here.  Likely related to blood loss from menses. Patient no longer bleeding and scheduled to see OB next week.    Discussed blood transfusion, will admit for  symptomatic anemia and transfusion.  Prior to receiving blood, patient developed 100.4 fever.  Unclear source at this time. No pneumonia on CXR. UA pending.   Suspect viral etiology with sick contact. By hx overall not consistent with influenza.  At this time suspect dyspnea from anemia, doubt thromboembolic disease given no hypoxia, no hx of estrogen use/no surgeries/no immobilization, no asymmetric leg swelling, however if dyspnea not improving after transfusion would consider further work up in work up of low grade fever. At this time fever more likely viral, however. Feel benefits of transfusion given level of symptomatic anemia and hgb 6.6 greater than risks. Admitted for further care.   Final Clinical Impressions(s) / ED Diagnoses   Final diagnoses:  Anemia, unspecified type  Menorrhagia with regular cycle    ED Discharge Orders    None       Gareth Morgan, MD 08/28/17 3838    Gareth Morgan, MD 09/15/17 1257

## 2017-08-28 NOTE — H&P (Signed)
History and Physical    Christine [name] OZH:[id number] DOB: [birth year] DOA: 08/28/2017  PCP: [facility]  Patient coming from: Home  I have personally briefly reviewed patient's old medical records in [place]  Chief Complaint: Fatigue/SOB/Dizziness  HPI: Christine [name] is a 42 y.o. female with medical history significant of fibroids, anxiety, anemia, bipolar disorder, PCO S, depression, menorrhagia presented to the ED with 5-6-day history of dizziness, headache, shortness of breath, fatigue, left-sided chest tightness.  Patient states does have heavy menstrual cycles and her last menstrual cycle lasted 10 days which has since then currently resolved.  Patient does endorse a history of iron deficiency anemia and unable to tolerate oral iron tablets however has received iron infusions the past.  Patient had presented to her PCPs office with a complaints as noted above lab work was obtained and patient was noted to have a hemoglobin of 5.7 and subsequently sent to the ED for further evaluation.  Patient denies any fevers, no chills, no nausea, no vomiting, no abdominal pain, no diarrhea, no constipation, no hematemesis, no hematochezia, no melena, no productive cough, no dysuria, no congestion, no myalgias, no rhinorrhea, no flulike symptoms.  Patient does endorse an episode of dizziness followed by questionable syncopal episode which she states did not last long.  ED Course: Patient seen in the ED, compressive metabolic profile obtained was unremarkable.  CBC obtained had a hemoglobin of 6.6, platelet count of 401 otherwise was unremarkable.  Urinalysis done was unremarkable.  Pregnancy test was negative.  Chest x-ray unremarkable.  EKG showed a sinus tachycardia.  Unit of packed red blood cells was ordered and patient was being transfused in the ED.  [facility] were called to admit the patient for further evaluation and management.  Review of Systems: As  per HPI otherwise 10 point review of systems negative.   Past Medical History:  Diagnosis Date  . Anemia   . Anxiety   . Bipolar 1 disorder ([place])   . Depression   . Fibroids   . High blood pressure   . Mental disorder   . PCOS (polycystic ovarian syndrome)     Past Surgical History:  Procedure Laterality Date  . DIAGNOSTIC LAPAROSCOPY    . DILATION AND CURETTAGE OF UTERUS    . HYSTEROSCOPY W/D&C  07/22/2011   Procedure: DILATATION AND CURETTAGE /HYSTEROSCOPY;  Surgeon: Cyril Mourning, MD;  Location: [facility];  Service: Gynecology;;     reports that  has never smoked. she has never used smokeless tobacco. She reports that she does not drink alcohol or use drugs.  Allergies  Allergen Reactions  . Penicillins Rash    Has patient had a PCN reaction causing immediate rash, facial/tongue/throat swelling, SOB or lightheadedness with hypotension: Yes Has patient had a PCN reaction causing severe rash involving mucus membranes or skin necrosis: Unknown Has patient had a PCN reaction that required hospitalization: No Has patient had a PCN reaction occurring within the last 10 years: No If all of the above answers are "NO", then may proceed with Cephalosporin use.     Family History  Problem Relation Age of Onset  . Anxiety disorder Mother   . Depression Mother    Mother alive age 90 with a history of anemia and hypertension.  Father alive age 39 with a history of hypertension, diabetes, coronary artery disease, end-stage renal disease on hemodialysis. Patient is currently unemployed and recently moved from [facility] in July 2018.  Prior to Admission medications   Medication Sig Start Date End Date Taking? Authorizing Provider  amLODipine (NORVASC) 10 MG tablet Take 10 mg by mouth daily. 12/31/16  Yes [provider]  Brexpiprazole ([name]) 0.25 MG TABS Take 0.5 mg by mouth daily. 08/18/17  Yes [name], [name], MD  clonazePAM (KLONOPIN) 0.5 MG tablet  Take 1 tablet (0.5 mg total) by mouth 2 (two) times daily as needed. 07/22/17  Yes [name], [name], MD  fluticasone (FLONASE) 50 MCG/ACT nasal spray SHAKE LQ AND U 2 SPRAYS IEN QD 12/31/16  Yes [provider]  ibuprofen (ADVIL,MOTRIN) 200 MG tablet Take 400 mg by mouth 2 (two) times daily as needed for headache.   Yes [provider]  lamoTRIgine (LAMICTAL) 150 MG tablet Take 2 tablets (300 mg total) by mouth daily. 07/22/17  Yes [name], [name], MD  lithium carbonate (ESKALITH) 450 MG CR tablet Take 1 tablet (450 mg total) by mouth 2 (two) times daily. 07/22/17 07/22/18 Yes [name], [name], MD  meloxicam (MOBIC) 7.5 MG tablet Take 7.5 mg by mouth 2 (two) times daily. 12/31/16  Yes [provider]  ARIPiprazole (ABILIFY) 10 MG tablet Take 1 tablet (10 mg total) by mouth daily. Patient not taking: Reported on 08/28/2017 07/21/17 07/21/18  Kathlee Nations, MD    Physical Exam: Vitals:   08/28/17 1606 08/28/17 1610 08/28/17 1713 08/28/17 1814  BP: (!) 137/91  136/83 (!) 156/96  Pulse: 95 98 95 (!) 104  Resp: (!) 26 (!) 28 12 (!) 25  Temp: 100.1 F (37.8 C)   99 F (37.2 C)  TempSrc: Oral   Oral  SpO2: 95%  96% 100%  Weight:      Height:        Constitutional: NAD, calm, comfortable. Obese Vitals:   08/28/17 1606 08/28/17 1610 08/28/17 1713 08/28/17 1814  BP: (!) 137/91  136/83 (!) 156/96  Pulse: 95 98 95 (!) 104  Resp: (!) 26 (!) 28 12 (!) 25  Temp: 100.1 F (37.8 C)   99 F (37.2 C)  TempSrc: Oral   Oral  SpO2: 95%  96% 100%  Weight:      Height:       Eyes: PERRLA, lids and conjunctivae normal ENMT: Mucous membranes are dry. Posterior pharynx clear of any exudate or lesions.Normal dentition.  Neck: normal, supple, no masses, no thyromegaly Respiratory: clear to auscultation bilaterally, no wheezing, no crackles. Normal respiratory effort. No accessory muscle use.  Cardiovascular: Regular rate and rhythm, no murmurs / rubs / gallops. No extremity edema.  2+ pedal pulses. No carotid bruits.  Abdomen: no tenderness, no masses palpated. No hepatosplenomegaly. Bowel sounds positive.  Musculoskeletal: no clubbing / cyanosis. No joint deformity upper and lower extremities. Good ROM, no contractures. Normal muscle tone.  Skin: no rashes, lesions, ulcers. No induration Neurologic: CN 2-12 grossly intact. Sensation intact, DTR normal. Strength 5/5 in all 4.  Psychiatric: Normal judgment and insight. Alert and oriented x 3. Normal mood.    Labs on Admission: I have personally reviewed following labs and imaging studies  CBC: Recent Labs  Lab 08/28/17 1336  WBC 9.6  NEUTROABS 7.3  HGB 6.6*  HCT 23.4*  MCV 66.3*  PLT 509*   Basic Metabolic Panel: Recent Labs  Lab 08/28/17 1336  NA 138  K 3.8  CL 105  CO2 24  GLUCOSE 89  BUN 9  CREATININE 0.79  CALCIUM 9.1   GFR: Estimated Creatinine Clearance: 140.9 mL/min (by C-G  formula based on SCr of 0.79 mg/dL). Liver Function Tests: Recent Labs  Lab 08/28/17 1336  AST 14*  ALT 16  ALKPHOS 63  BILITOT 0.3  PROT 7.4  ALBUMIN 4.1   No results for input(s): LIPASE, AMYLASE in the last 168 hours. No results for input(s): AMMONIA in the last 168 hours. Coagulation Profile: No results for input(s): INR, PROTIME in the last 168 hours. Cardiac Enzymes: No results for input(s): CKTOTAL, CKMB, CKMBINDEX, TROPONINI in the last 168 hours. BNP (last 3 results) No results for input(s): PROBNP in the last 8760 hours. HbA1C: No results for input(s): HGBA1C in the last 72 hours. CBG: No results for input(s): GLUCAP in the last 168 hours. Lipid Profile: No results for input(s): CHOL, HDL, LDLCALC, TRIG, CHOLHDL, LDLDIRECT in the last 72 hours. Thyroid Function Tests: No results for input(s): TSH, T4TOTAL, FREET4, T3FREE, THYROIDAB in the last 72 hours. Anemia Panel: Recent Labs    08/28/17 1422  RETICCTPCT 2.9   Urine analysis:    Component Value Date/Time   COLORURINE STRAW (A)  08/28/2017 1319   APPEARANCEUR CLEAR 08/28/2017 1319   LABSPEC 1.006 08/28/2017 1319   PHURINE 8.0 08/28/2017 1319   GLUCOSEU NEGATIVE 08/28/2017 1319   HGBUR NEGATIVE 08/28/2017 1319   BILIRUBINUR NEGATIVE 08/28/2017 1319   KETONESUR NEGATIVE 08/28/2017 1319   PROTEINUR NEGATIVE 08/28/2017 1319   UROBILINOGEN 1.0 01/20/2010 1111   NITRITE NEGATIVE 08/28/2017 1319   LEUKOCYTESUR NEGATIVE 08/28/2017 1319    Radiological Exams on Admission: Dg Chest 2 View  Result Date: 08/28/2017 CLINICAL DATA:  Shortness of breath and fatigue. EXAM: CHEST  2 VIEW COMPARISON:  08/10/2012 FINDINGS: The heart size and mediastinal contours are within normal limits. Both lungs are clear. The visualized skeletal structures are unremarkable. IMPRESSION: No active cardiopulmonary disease. Electronically Signed   By: Markus Daft M.D.   On: 08/28/2017 13:33    EKG: Independently reviewed. Sinus tachycardia  Assessment/Plan Principal Problem:   Symptomatic anemia Active Problems:   Acute blood loss anemia   Bipolar disorder (HCC)   Anxiety   Depression   Fibroids   PCOS (polycystic ovarian syndrome)   Fever   HTN (hypertension)   #1 symptomatic anemia/ Patient presented with fatigue, dizziness, shortness of breath, headache noted to have a hemoglobin of 5.7 at PCPs office.  Repeat CBC in the ED with a hemoglobin of 6.6.  Patient noted to have a history of iron deficiency anemia.  Anemia panel pending.  Patient being transfused a unit of packed red blood cells in the ED.  Will transfuse another unit of packed red blood cells for total of you 2 units packed red blood cells.  Follow H&H.  If anemia panel consistent with iron deficiency anemia we will give a dose of IV Feraheme during this hospitalization.  Patient states has outpatient appointment in a week to see OB/GYN as patient does have a history of fibroids.  Patient also states has outpatient appointment with hematology.  Follow.  2.  Acute blood loss  anemia Injury to problem #1.  3.  Chest pain Patient with some complaints of chest pain/tightness, secondary to problem #1.  Will cycle cardiac enzymes every 6 hours x3.  Check a 2D echo.  Patient will be transfused a total of 2 units packed red blood cells.  Follow H&H.  4.  Fever Patient noted to have a low-grade fever in the ED with a temperature of 100.4.  Urinalysis unremarkable.  Chest x-ray unremarkable.  No need for  antibiotics at this time.  Follow.  5.  Depression/anxiety/bipolar disorder Currently stable.  Continue home regimen of brexpiprazole, Lamictal, lithium.  Klonopin as needed.  Follow.  6.  Hypertension Resume home regimen of Norvasc 10 mg daily.    #7 polycystic ovarian syndrome Outpatient follow-up with OB/GYN.   DVT prophylaxis: scds Code Status: Full Family Communication: Updated patient.  No family at bedside. Disposition Plan: Home once medically stable with clinical improvement. Consults called: None Admission status: Place in observation.   Irine Seal MD [facility] Pager [phone]  If 7PM-7AM, please contact night-coverage [email or web address] Password [facility]  08/28/2017, 6:57 PM

## 2017-08-29 ENCOUNTER — Telehealth: Payer: Self-pay | Admitting: Hematology and Oncology

## 2017-08-29 ENCOUNTER — Observation Stay (HOSPITAL_BASED_OUTPATIENT_CLINIC_OR_DEPARTMENT_OTHER): Payer: BC Managed Care – PPO

## 2017-08-29 DIAGNOSIS — R079 Chest pain, unspecified: Secondary | ICD-10-CM | POA: Diagnosis not present

## 2017-08-29 DIAGNOSIS — F419 Anxiety disorder, unspecified: Secondary | ICD-10-CM | POA: Diagnosis not present

## 2017-08-29 DIAGNOSIS — D62 Acute posthemorrhagic anemia: Secondary | ICD-10-CM | POA: Diagnosis not present

## 2017-08-29 DIAGNOSIS — D219 Benign neoplasm of connective and other soft tissue, unspecified: Secondary | ICD-10-CM

## 2017-08-29 DIAGNOSIS — D649 Anemia, unspecified: Secondary | ICD-10-CM | POA: Diagnosis not present

## 2017-08-29 DIAGNOSIS — F319 Bipolar disorder, unspecified: Secondary | ICD-10-CM | POA: Diagnosis not present

## 2017-08-29 DIAGNOSIS — N920 Excessive and frequent menstruation with regular cycle: Secondary | ICD-10-CM

## 2017-08-29 LAB — ECHOCARDIOGRAM COMPLETE
Ao-asc: 36 cm
E decel time: 225 msec
FS: 42 % (ref 28–44)
Height: 65 in
IVS/LV PW RATIO, ED: 1.27
LA ID, A-P, ES: 46 mm
LA diam end sys: 46 mm
LA diam index: 1.68 cm/m2
LA vol A4C: 54.3 ml
LA vol index: 20.9 mL/m2
LA vol: 57.2 mL
LV PW d: 11 mm — AB (ref 0.6–1.1)
LV e' LATERAL: 13.8 cm/s
LVOT area: 3.8 cm2
LVOT diameter: 22 mm
Lateral S' vel: 25.4 cm/s
MV Dec: 225
MV pk E vel: 1.5 m/s
TAPSE: 21.4 mm
TDI e' lateral: 13.8
TDI e' medial: 14.1
Weight: 5372.17 oz

## 2017-08-29 LAB — PREPARE RBC (CROSSMATCH)

## 2017-08-29 LAB — BASIC METABOLIC PANEL
Anion gap: 7 (ref 5–15)
BUN: 8 mg/dL (ref 6–20)
CO2: 25 mmol/L (ref 22–32)
Calcium: 8.6 mg/dL — ABNORMAL LOW (ref 8.9–10.3)
Chloride: 104 mmol/L (ref 101–111)
Creatinine, Ser: 0.94 mg/dL (ref 0.44–1.00)
GFR calc Af Amer: >60 mL/min (ref >60)
GFR calc non Af Amer: >60 mL/min (ref >60)
Glucose, Bld: 105 mg/dL — ABNORMAL HIGH (ref 65–99)
Potassium: 3.8 mmol/L (ref 3.5–5.1)
Sodium: 136 mmol/L (ref 135–145)

## 2017-08-29 LAB — HEMOGLOBIN AND HEMATOCRIT, BLOOD
HCT: 30.6 % — ABNORMAL LOW (ref 36.0–46.0)
Hemoglobin: 9 g/dL — ABNORMAL LOW (ref 12.0–15.0)

## 2017-08-29 LAB — TROPONIN I
Troponin I: <0.03 ng/mL (ref <0.03)
Troponin I: <0.03 ng/mL (ref <0.03)

## 2017-08-29 LAB — HIV ANTIBODY (ROUTINE TESTING W REFLEX): HIV Screen 4th Generation wRfx: NONREACTIVE

## 2017-08-29 LAB — CBC
HCT: 26.5 % — ABNORMAL LOW (ref 36.0–46.0)
Hemoglobin: 7.9 g/dL — ABNORMAL LOW (ref 12.0–15.0)
MCH: 20.9 pg — ABNORMAL LOW (ref 26.0–34.0)
MCHC: 29.8 g/dL — ABNORMAL LOW (ref 30.0–36.0)
MCV: 70.1 fL — ABNORMAL LOW (ref 78.0–100.0)
Platelets: 368 10*3/uL (ref 150–400)
RBC: 3.78 MIL/uL — ABNORMAL LOW (ref 3.87–5.11)
RDW: 22.5 % — ABNORMAL HIGH (ref 11.5–15.5)
WBC: 7.7 10*3/uL (ref 4.0–10.5)

## 2017-08-29 MED ORDER — SODIUM CHLORIDE 0.9 % IV SOLN: Freq: Once | INTRAVENOUS | Status: DC

## 2017-08-29 MED ORDER — FUROSEMIDE 10 MG/ML IJ SOLN
20.0000 mg | Freq: Once | INTRAMUSCULAR | Status: AC
  Administered 2017-08-29: 20 mg via INTRAVENOUS
  Filled 2017-08-29 (×2): qty 2

## 2017-08-29 MED ORDER — ACETAMINOPHEN 325 MG PO TABS
650.0000 mg | ORAL_TABLET | Freq: Once | ORAL | Status: AC
  Administered 2017-08-29: 650 mg via ORAL
  Filled 2017-08-29: qty 2

## 2017-08-29 MED ORDER — SODIUM CHLORIDE 0.9 % IV SOLN
510.0000 mg | Freq: Once | INTRAVENOUS | Status: AC
  Administered 2017-08-29: 510 mg via INTRAVENOUS
  Filled 2017-08-29: qty 17

## 2017-08-29 MED ORDER — AMLODIPINE BESYLATE 10 MG PO TABS: 10.0000 mg | ORAL_TABLET | Freq: Every day | ORAL | 0 refills | Status: DC

## 2017-08-29 MED ORDER — PANTOPRAZOLE SODIUM 40 MG PO TBEC: 40.0000 mg | DELAYED_RELEASE_TABLET | Freq: Every day | ORAL | 0 refills | Status: DC

## 2017-08-29 MED ORDER — FUROSEMIDE 10 MG/ML IJ SOLN
20.0000 mg | Freq: Once | INTRAMUSCULAR | Status: AC
  Administered 2017-08-29: 20 mg via INTRAVENOUS

## 2017-08-29 MED ORDER — DIPHENHYDRAMINE HCL 25 MG PO CAPS
25.0000 mg | ORAL_CAPSULE | Freq: Once | ORAL | Status: AC
  Administered 2017-08-29: 25 mg via ORAL
  Filled 2017-08-29: qty 1

## 2017-08-29 NOTE — Progress Notes (Signed)
PT Cancellation Note  Patient Details Name: Christine [name] MRN: [id number] DOB: [birth year]   Cancelled Treatment:    Reason Eval/Treat Not Completed: PT screened, no needs identified, will sign off. Pt mobilizing in room without assistance or difficulty. Spoke with RN who confirms this as well. Will sign off-no PT needs.    Weston Anna, MPT Pager: [phone]

## 2017-08-29 NOTE — Progress Notes (Signed)
  Echocardiogram 2D Echocardiogram has been performed.  Christine [name] G Jya Hughston 08/29/2017, 3:04 PM

## 2017-08-29 NOTE — Care Management Note (Signed)
Case Management Note  Patient Details  Name: Christine [name] MRN: [id number] Date of Birth: 06/02/1976  Subjective/Objective:                  anemia  Action/Plan: Date: August 29, 2017 Velva Harman, BSN, [place], [place] Chart and notes review for patient progress and needs. Will follow for case management and discharge needs. No cm or discharge needs present at time of this review. Next review date: [id number]  Expected Discharge Date:  (unknown)               Expected Discharge Plan:  Home/Self Care  In-House Referral:     Discharge planning Services  CM Consult  Post Acute Care Choice:    Choice offered to:     DME Arranged:    DME Agency:     HH Arranged:    HH Agency:     Status of Service:  In process, will continue to follow  If discussed at Long Length of Stay Meetings, dates discussed:    Additional Comments:  Leeroy Cha, RN 08/29/2017, 8:09 AM

## 2017-08-29 NOTE — Progress Notes (Signed)
OT Cancellation Note  Patient Details Name: Christine [name] MRN: [id number] DOB: 30-Sep-1975   Cancelled Treatment:    Reason Eval/Treat Not Completed: OT screened, no needs identified, will sign off  [facility], OT/L  [phone] 08/29/2017 08/29/2017, 2:35 PM

## 2017-08-29 NOTE — Telephone Encounter (Signed)
Spoke to Christine [name] at [facility] requesting the pt's labs. I noticed the pt has been admitted in the hospital.

## 2017-08-29 NOTE — Discharge Summary (Signed)
Physician Discharge Summary  Christine [name] WLN:[id number] DOB: 08-10-1975 DOA: 08/28/2017  PCP: Maude Leriche, PA-C  Admit date: 08/28/2017 Discharge date: 08/29/2017  Time spent: 60 minutes  Recommendations for Outpatient Follow-up:  1. Follow-up with Scifres, Dorothy, PA-C in 1-2 weeks.  On follow-up patient will need a CBC done to follow-up on H&H. 2. Follow-up with OB/GYN as scheduled.   Discharge Diagnoses:  Principal Problem:   Symptomatic anemia Active Problems:   Acute blood loss anemia   Bipolar disorder (HCC)   Anxiety   Depression   Fibroids   PCOS (polycystic ovarian syndrome)   Fever   HTN (hypertension)   Menorrhagia with regular cycle   Discharge Condition: Stable and improved  Diet recommendation: Regular  Filed Weights   08/28/17 1203 08/29/17 0542  Weight: (!) 153.3 kg (338 lb) (!) 152.3 kg (335 lb 12.2 oz)    History of present illness:  Christine [name] is a 42 y.o. female with medical history significant of fibroids, anxiety, anemia, bipolar disorder, PCO S, depression, menorrhagia presented to the ED with 5-6-day history of dizziness, headache, shortness of breath, fatigue, left-sided chest tightness.  Patient stated that she has heavy menstrual cycles and her last menstrual cycle lasted 10 days which has since then currently resolved.  Patient did endorse a history of iron deficiency anemia and unable to tolerate oral iron tablets however had received iron infusions the past.  Patient had presented to her PCPs office with a complaints as noted above lab work was obtained and patient was noted to have a hemoglobin of 5.7 and subsequently sent to the ED for further evaluation.  Patient denied any fevers, no chills, no nausea, no vomiting, no abdominal pain, no diarrhea, no constipation, no hematemesis, no hematochezia, no melena, no productive cough, no dysuria, no congestion, no myalgias, no rhinorrhea, no flulike symptoms.  Patient did endorse an episode  of dizziness followed by questionable syncopal episode which she stated did not last long.  ED Course: Patient seen in the ED, comprehensive metabolic profile obtained was unremarkable.  CBC obtained had a hemoglobin of 6.6, platelet count of 401 otherwise was unremarkable.  Urinalysis done was unremarkable.  Pregnancy test was negative.  Chest x-ray unremarkable.  EKG showed a sinus tachycardia.  1 Unit of packed red blood cells was ordered and patient was being transfused in the ED.  [facility] were called to admit the patient for further evaluation and management.    Hospital Course:  #1 symptomatic anemia/ Patient presented with fatigue, dizziness, shortness of breath, headache noted to have a hemoglobin of 5.7 at PCPs office.  Repeat CBC in the ED with a hemoglobin of 6.6.  Patient noted to have a history of iron deficiency anemia.  Anemia panel was consistent with iron deficiency anemia. Patient received a unit of packed red blood cells in the ED and subsequently received 2 more units on the floor with a total of 3 units of packed red blood cells.  Patient improved clinically.  Patient was also given a dose of IV Feraheme during his hospitalization.  Hemoglobin improved such that by day of discharge hemoglobin was 9.0.  Patient states has outpatient appointment in a week to see OB/GYN as patient does have a history of fibroids.  Patient will be discharged in stable and improved condition and is to follow-up with PCP, OB/GYN and hematologist in the outpatient setting.   2.  Acute blood loss anemia Secondary to problem #1.  3.  Chest pain Patient with  some complaints of chest pain/tightness, secondary to problem #1.   Cardiac enzymes were cycled which were negative x3.  2D echo was obtained with a EF of 65-70%, no wall motion abnormalities, left ventricular diastolic function parameters were normal, mildly dilated left atrium.  Patient improved after transfusion of packed red blood cells  with resolution of chest pain.  Outpatient follow-up with PCP.   4.  Fever Patient noted to have a low-grade fever in the ED with a temperature of 100.4.  Urinalysis unremarkable.  Chest x-ray unremarkable.  Patient with no further episodes of fever. No need for antibiotics at this time.  Outpatient follow-up.   5.  Depression/anxiety/bipolar disorder Main stable throughout the hospitalization.  Patient was maintained on home regimen of brexpiprazole, Lamictal, lithium.  Klonopin as needed.  6.  Hypertension Patient was maintained on home regimen of Norvasc 10 mg daily.    #7 polycystic ovarian syndrome Outpatient follow-up with OB/GYN.    Procedures:  2D echo 08/29/2017  3 units packed red blood cells--08/28/2017-08/29/2017  Consultations:  None  Discharge Exam: Vitals:   08/29/17 1430 08/29/17 1505  BP: (!) 146/72 (!) 149/69  Pulse: 89 91  Resp: (!) 24 20  Temp: 98.9 F (37.2 C) 99.1 F (37.3 C)  SpO2: 96% 99%    General: NAD Cardiovascular: RRR Respiratory: CTAB  Discharge Instructions   Discharge Instructions    Diet general   Complete by:  As directed    Increase activity slowly   Complete by:  As directed      Allergies as of 08/29/2017      Reactions   Penicillins Rash   Has patient had a PCN reaction causing immediate rash, facial/tongue/throat swelling, SOB or lightheadedness with hypotension: Yes Has patient had a PCN reaction causing severe rash involving mucus membranes or skin necrosis: Unknown Has patient had a PCN reaction that required hospitalization: No Has patient had a PCN reaction occurring within the last 10 years: No If all of the above answers are "NO", then may proceed with Cephalosporin use.      Medication List    STOP taking these medications   ARIPiprazole 10 MG tablet Commonly known as:  ABILIFY     TAKE these medications   amLODipine 10 MG tablet Commonly known as:  NORVASC Take 1 tablet (10 mg total) by mouth  daily.   Brexpiprazole 0.25 MG Tabs Commonly known as:  [name] Take 0.5 mg by mouth daily.   clonazePAM 0.5 MG tablet Commonly known as:  KLONOPIN Take 1 tablet (0.5 mg total) by mouth 2 (two) times daily as needed.   fluticasone 50 MCG/ACT nasal spray Commonly known as:  FLONASE SHAKE LQ AND U 2 SPRAYS IEN QD   ibuprofen 200 MG tablet Commonly known as:  ADVIL,MOTRIN Take 400 mg by mouth 2 (two) times daily as needed for headache.   lamoTRIgine 150 MG tablet Commonly known as:  LAMICTAL Take 2 tablets (300 mg total) by mouth daily.   lithium carbonate 450 MG CR tablet Commonly known as:  ESKALITH Take 1 tablet (450 mg total) by mouth 2 (two) times daily.   meloxicam 7.5 MG tablet Commonly known as:  MOBIC Take 7.5 mg by mouth 2 (two) times daily.   pantoprazole 40 MG tablet Commonly known as:  PROTONIX Take 1 tablet (40 mg total) by mouth daily at 6 (six) AM. Start taking on:  08/30/2017      Allergies  Allergen Reactions  . Penicillins Rash  Has patient had a PCN reaction causing immediate rash, facial/tongue/throat swelling, SOB or lightheadedness with hypotension: Yes Has patient had a PCN reaction causing severe rash involving mucus membranes or skin necrosis: Unknown Has patient had a PCN reaction that required hospitalization: No Has patient had a PCN reaction occurring within the last 10 years: No If all of the above answers are "NO", then may proceed with Cephalosporin use.    Follow-up Information    Scifres, Earlie Server, [place]. Schedule an appointment as soon as possible for a visit in 1 week(s).   Specialty:  Physician Assistant Why:  Follow-up in 1-2 weeks. Contact information: [place] [address] [phone]        OB/GYN Follow up.   Why:  Follow-up as scheduled next week.           The results of significant diagnostics from this hospitalization (including imaging, microbiology, ancillary and laboratory) are  listed below for reference.    Significant Diagnostic Studies: Dg Chest 2 View  Result Date: 08/28/2017 CLINICAL DATA:  Shortness of breath and fatigue. EXAM: CHEST  2 VIEW COMPARISON:  08/10/2012 FINDINGS: The heart size and mediastinal contours are within normal limits. Both lungs are clear. The visualized skeletal structures are unremarkable. IMPRESSION: No active cardiopulmonary disease. Electronically Signed   By: Markus Daft M.D.   On: 08/28/2017 13:33    Microbiology: No results found for this or any previous visit (from the past 240 hour(s)).   Labs: Basic Metabolic Panel: Recent Labs  Lab 08/28/17 1336 08/28/17 2032 08/29/17 0256  NA 138  --  136  K 3.8  --  3.8  CL 105  --  104  CO2 24  --  25  GLUCOSE 89  --  105*  BUN 9  --  8  CREATININE 0.79  --  0.94  CALCIUM 9.1  --  8.6*  MG  --  1.9  --    Liver Function Tests: Recent Labs  Lab 08/28/17 1336  AST 14*  ALT 16  ALKPHOS 63  BILITOT 0.3  PROT 7.4  ALBUMIN 4.1   No results for input(s): LIPASE, AMYLASE in the last 168 hours. No results for input(s): AMMONIA in the last 168 hours. CBC: Recent Labs  Lab 08/28/17 1336 08/29/17 0256 08/29/17 1713  WBC 9.6 7.7  --   NEUTROABS 7.3  --   --   HGB 6.6* 7.9* 9.0*  HCT 23.4* 26.5* 30.6*  MCV 66.3* 70.1*  --   PLT 401* 368  --    Cardiac Enzymes: Recent Labs  Lab 08/28/17 2032 08/29/17 0256 08/29/17 0925  TROPONINI <0.03 <0.03 <0.03   BNP: BNP (last 3 results) No results for input(s): BNP in the last 8760 hours.  ProBNP (last 3 results) No results for input(s): PROBNP in the last 8760 hours.  CBG: No results for input(s): GLUCAP in the last 168 hours.     Signed:  Irine Seal MD.  [facility] 08/29/2017, 6:11 PM

## 2017-08-30 LAB — URINE CULTURE

## 2017-08-31 LAB — TYPE AND SCREEN
ABO/RH(D): O POS
Antibody Screen: NEGATIVE
Unit division: 0
Unit division: 0
Unit division: 0

## 2017-08-31 LAB — BPAM RBC
Blood Product Expiration Date: 201902252359
Blood Product Expiration Date: 201902252359
Blood Product Expiration Date: 201902252359
ISSUE DATE / TIME: 201901241534
ISSUE DATE / TIME: 201901242200
ISSUE DATE / TIME: 201901251118
Unit Type and Rh: 5100
Unit Type and Rh: 5100
Unit Type and Rh: 5100

## 2017-09-01 ENCOUNTER — Other Ambulatory Visit (HOSPITAL_COMMUNITY): Payer: Self-pay | Admitting: Psychiatry

## 2017-09-01 ENCOUNTER — Telehealth (HOSPITAL_COMMUNITY): Payer: Self-pay

## 2017-09-01 DIAGNOSIS — F319 Bipolar disorder, unspecified: Secondary | ICD-10-CM

## 2017-09-01 MED ORDER — BREXPIPRAZOLE 1 MG PO TABS: 1.0000 mg | ORAL_TABLET | Freq: Every day | ORAL | 0 refills | Status: DC

## 2017-09-01 NOTE — Telephone Encounter (Signed)
Prescription [name] 1 mg send to [facility] [address]. Please call patient to pick up

## 2017-09-01 NOTE — Telephone Encounter (Signed)
Called patient and let her know, I also called the pharmacy to apply the co-pay card.

## 2017-09-01 NOTE — Telephone Encounter (Signed)
Patient is calling to tell you that [facility] did help and she would like a full prescription. Please review and advise, thank you

## 2017-09-04 ENCOUNTER — Telehealth (HOSPITAL_COMMUNITY): Payer: Self-pay | Admitting: Professional

## 2017-09-04 ENCOUNTER — Telehealth (HOSPITAL_COMMUNITY): Payer: Self-pay

## 2017-09-04 NOTE — Telephone Encounter (Signed)
She should be seen by psychiatrist at [facility] and consider adjusting lithium dose.

## 2017-09-04 NOTE — Telephone Encounter (Signed)
Patient is starting PHP, she has a low Lithium level (0.5) would you like to increase? Please review and advise, thank you

## 2017-09-05 ENCOUNTER — Telehealth (HOSPITAL_COMMUNITY): Payer: Self-pay

## 2017-09-05 NOTE — Telephone Encounter (Signed)
Medication management - Prior Authorization for patient's [name] approved online by CoverMyMeds.  Called patient's [facility] to inform patient's [name] was approved and could be filled now.

## 2017-09-05 NOTE — Telephone Encounter (Signed)
Medication management - Prior Authorization for patient's [name] completed online with CoverMyMeds and pending response.

## 2017-09-08 ENCOUNTER — Other Ambulatory Visit (HOSPITAL_COMMUNITY): Payer: BC Managed Care – PPO | Attending: Psychiatry | Admitting: Licensed Clinical Social Worker

## 2017-09-08 DIAGNOSIS — F319 Bipolar disorder, unspecified: Secondary | ICD-10-CM | POA: Insufficient documentation

## 2017-09-08 DIAGNOSIS — F3162 Bipolar disorder, current episode mixed, moderate: Secondary | ICD-10-CM

## 2017-09-08 DIAGNOSIS — R569 Unspecified convulsions: Secondary | ICD-10-CM | POA: Insufficient documentation

## 2017-09-08 DIAGNOSIS — I10 Essential (primary) hypertension: Secondary | ICD-10-CM | POA: Insufficient documentation

## 2017-09-08 DIAGNOSIS — F419 Anxiety disorder, unspecified: Secondary | ICD-10-CM | POA: Diagnosis not present

## 2017-09-08 DIAGNOSIS — D649 Anemia, unspecified: Secondary | ICD-10-CM | POA: Diagnosis not present

## 2017-09-08 DIAGNOSIS — M797 Fibromyalgia: Secondary | ICD-10-CM | POA: Insufficient documentation

## 2017-09-09 ENCOUNTER — Other Ambulatory Visit (HOSPITAL_COMMUNITY): Payer: BC Managed Care – PPO | Admitting: Occupational Therapy

## 2017-09-09 ENCOUNTER — Ambulatory Visit (HOSPITAL_COMMUNITY): Payer: Self-pay | Admitting: Licensed Clinical Social Worker

## 2017-09-09 ENCOUNTER — Other Ambulatory Visit: Payer: Self-pay

## 2017-09-09 ENCOUNTER — Encounter (HOSPITAL_COMMUNITY): Payer: Self-pay | Admitting: Occupational Therapy

## 2017-09-09 ENCOUNTER — Other Ambulatory Visit (HOSPITAL_COMMUNITY): Payer: BC Managed Care – PPO | Admitting: Licensed Clinical Social Worker

## 2017-09-09 DIAGNOSIS — F0789 Other personality and behavioral disorders due to known physiological condition: Secondary | ICD-10-CM

## 2017-09-09 DIAGNOSIS — F319 Bipolar disorder, unspecified: Secondary | ICD-10-CM

## 2017-09-09 DIAGNOSIS — R4589 Other symptoms and signs involving emotional state: Secondary | ICD-10-CM

## 2017-09-09 DIAGNOSIS — F3162 Bipolar disorder, current episode mixed, moderate: Secondary | ICD-10-CM

## 2017-09-09 MED ORDER — LAMOTRIGINE 150 MG PO TABS: 300.0000 mg | ORAL_TABLET | Freq: Every day | ORAL | 0 refills | Status: DC

## 2017-09-09 MED ORDER — BREXPIPRAZOLE 2 MG PO TABS: 2.0000 mg | ORAL_TABLET | Freq: Every day | ORAL | 0 refills | Status: DC

## 2017-09-09 MED ORDER — LITHIUM CARBONATE ER 300 MG PO TBCR: 600.0000 mg | EXTENDED_RELEASE_TABLET | Freq: Two times a day (BID) | ORAL | 0 refills | Status: DC

## 2017-09-09 NOTE — Progress Notes (Signed)
[facility] Initial PHP Assessment Note  Christine [name] [id number] 42 y.o.  09/09/2017 10:47 AM  Chief Complaint:  I have been depressed and sad.  History of Present Illness:  Patient is 42 year old African-American married currently on disability known to this Probation officer from the office came to start [facility] program.  She is referred from her therapist.  Patient has been seeing in the office since last year.  She has significant history of bipolar disorder.  Recently she is having difficulty coping with her illness.  She also have a lot of physical symptoms.  Last month she was admitted in the hospital on the medical floor due to anemia.  Her hemoglobin was 6.6.  She received blood transfusion and her last hemoglobin was 9.0.  She still feels very tired, fatigue with lack of energy.  She also endorsed lack of motivation to do things.  She has difficulty getting her prescription.  She cannot afford Abilify.  We started her on [name] and recently lithium dose increase.  She is seen much improvement with medication adjustment.  Her paranoia and hallucination is less intense and less frequent.  Also taking Lamictal 150 mg twice a day.  She has no rash, itching tremors or shakes.  She admitted sometimes poor sleep, racing thoughts.  She denies any suicidal thoughts but endorsed anhedonia, hopelessness.  Endorsed difficulty in concentration and doing multitasking.  She lives with her husband and 2 adopted children.  Her husband is very supportive.  Patient has obesity, migraine headaches, non-elliptical seizures, anemia and fibromyalgia.  She remember her last seizure-like activity was in November 2016.  She had workup at [facility] but her EEG was normal.  In the past she had tried [name], Prozac, Seroquel, Lexapro, Celexa, Effexor, Risperdal.  However she had a good response with Abilify but she cannot afford Abilify.  Patient will start PHP today.  She has anxiety and she believe Klonopin helping her.  She  does not ask for early refills of benzodiazepine.  Suicidal Ideation: No Plan Formed: No Patient has means to carry out plan: No  Homicidal Ideation: No Plan Formed: No Patient has means to carry out plan: No  Past Psychiatric History/Hospitalization(s): Patient remember being depressed since she was 42 years old.  She started taking antidepressant however she remember antidepressant makes her manic.  She tried numerous medication including Risperdal, Seroquel, Abilify, lithium, Prozac, Paxil, Zoloft, Lexapro, Celexa, Effexor and Klonopin.  We have tried [name].  She has 3 psychiatric hospitalization.  She denies any history of suicidal attempt.  She has a history of manic and impulsive behavior.  Her last hospitalization was in November 2017.  She was seeing Dr. [name].  Family History; Reviewed.  Medical History; Reviewed.  Traumatic brain injury: Denies any traumatic brain injury.  Psychosocial History; Patient born and raised in [place].  She is been married for 17 years.  She has 2 adopted children who are living with her.  Her husband is very supportive.  Patient has major in psychology.  Currently she is on long-term disability.  Legal History; Denies  History Of Abuse; Denies history of rape but denies any nightmares or flashbacks.  Substance Abuse History; Denies any history of substance use.  Review of Systems: Psychiatric: Agitation: No Hallucination: No Depressed Mood: Yes Insomnia: No Hypersomnia: No Altered Concentration: No Feels Worthless: Yes Grandiose Ideas: No Belief In Special Powers: No New/Increased Substance Abuse: No Compulsions: No  Neurologic: Headache: Yes Seizure: No Paresthesias: No   Outpatient  Encounter Medications as of 09/09/2017  Medication Sig  . amLODipine (NORVASC) 10 MG tablet Take 1 tablet (10 mg total) by mouth daily.  . Brexpiprazole ([name]) 1 MG TABS Take 1 tablet (1 mg total) by mouth daily.  . clonazePAM  (KLONOPIN) 0.5 MG tablet Take 1 tablet (0.5 mg total) by mouth 2 (two) times daily as needed.  . fluticasone (FLONASE) 50 MCG/ACT nasal spray SHAKE LQ AND U 2 SPRAYS IEN QD  . ibuprofen (ADVIL,MOTRIN) 200 MG tablet Take 400 mg by mouth 2 (two) times daily as needed for headache.  . lamoTRIgine (LAMICTAL) 150 MG tablet Take 2 tablets (300 mg total) by mouth daily.  [name] lithium carbonate (ESKALITH) 450 MG CR tablet Take 1 tablet (450 mg total) by mouth 2 (two) times daily.  . meloxicam (MOBIC) 7.5 MG tablet Take 7.5 mg by mouth 2 (two) times daily.  . pantoprazole (PROTONIX) 40 MG tablet Take 1 tablet (40 mg total) by mouth daily at 6 (six) AM.   No facility-administered encounter medications on file as of 09/09/2017.     Recent Results (from the past 2160 hour(s))  Lithium level     Status: Abnormal   Collection Time: 08/26/17 10:51 AM  Result Value Ref Range   Lithium Lvl 0.5 (L) 0.6 - 1.2 mmol/L    Comment:                                  Detection Limit = 0.1                           <0.1 indicates None Detected   Urinalysis, Routine w reflex microscopic     Status: Abnormal   Collection Time: 08/28/17  1:19 PM  Result Value Ref Range   Color, Urine STRAW (A) YELLOW   APPearance CLEAR CLEAR   Specific Gravity, Urine 1.006 1.005 - 1.030   pH 8.0 5.0 - 8.0   Glucose, UA NEGATIVE NEGATIVE mg/dL   Hgb urine dipstick NEGATIVE NEGATIVE   Bilirubin Urine NEGATIVE NEGATIVE   Ketones, ur NEGATIVE NEGATIVE mg/dL   Protein, ur NEGATIVE NEGATIVE mg/dL   Nitrite NEGATIVE NEGATIVE   Leukocytes, UA NEGATIVE NEGATIVE  Urine culture     Status: Abnormal   Collection Time: 08/28/17  1:19 PM  Result Value Ref Range   Specimen Description URINE, RANDOM    Special Requests NONE    Culture MULTIPLE SPECIES PRESENT, SUGGEST RECOLLECTION (A)    Report Status 08/30/2017 FINAL   Type and screen [place]     Status: None   Collection Time: 08/28/17  1:35 PM  Result Value Ref  Range   ABO/RH(D) O POS    Antibody Screen NEG    Sample Expiration 08/31/2017    Unit Number [id number]    Blood Component Type RED CELLS,LR    Unit division 00    Status of Unit ISSUED,FINAL    Transfusion Status OK TO TRANSFUSE    Crossmatch Result Compatible    Unit Number [id number]    Blood Component Type RED CELLS,LR    Unit division 00    Status of Unit ISSUED,FINAL    Transfusion Status OK TO TRANSFUSE    Crossmatch Result Compatible    Unit Number [id number]    Blood Component Type RED CELLS,LR    Unit division 00    Status of Unit ISSUED,FINAL  Transfusion Status OK TO TRANSFUSE    Crossmatch Result Compatible   ABO/Rh     Status: None   Collection Time: 08/28/17  1:35 PM  Result Value Ref Range   ABO/RH(D) O POS   BPAM RBC     Status: None   Collection Time: 08/28/17  1:35 PM  Result Value Ref Range   ISSUE DATE / TIME [id number]    Blood Product Unit Number [id number]    PRODUCT CODE E0336V00    Unit Type and Rh 5100    Blood Product Expiration Date 676195093267    ISSUE DATE / TIME [id number]    Blood Product Unit Number [id number]    PRODUCT CODE E0336V00    Unit Type and Rh 5100    Blood Product Expiration Date 379024097353    ISSUE DATE / TIME [id number]    Blood Product Unit Number [id number]    PRODUCT CODE E0401V00    Unit Type and Rh 5100    Blood Product Expiration Date [id number]   CBC with Differential     Status: Abnormal   Collection Time: 08/28/17  1:36 PM  Result Value Ref Range   WBC 9.6 4.0 - 10.5 K/uL   RBC 3.53 (L) 3.87 - 5.11 MIL/uL   Hemoglobin 6.6 (LL) 12.0 - 15.0 g/dL    Comment: REPEATED TO VERIFY CRITICAL RESULT CALLED TO, READ BACK BY AND VERIFIED WITH: STANHOPE,M @ 1459 ON [id number] BY POTEAT,S    HCT 23.4 (L) 36.0 - 46.0 %   MCV 66.3 (L) 78.0 - 100.0 fL   MCH 18.7 (L) 26.0 - 34.0 pg   MCHC 28.2 (L) 30.0 - 36.0 g/dL   RDW 20.2 (H) 11.5 - 15.5 %   Platelets 401 (H) 150 - 400 K/uL   Neutrophils  Relative % 76 %   Lymphocytes Relative 15 %   Monocytes Relative 8 %   Eosinophils Relative 1 %   Basophils Relative 0 %   Neutro Abs 7.3 1.7 - 7.7 K/uL   Lymphs Abs 1.4 0.7 - 4.0 K/uL   Monocytes Absolute 0.8 0.1 - 1.0 K/uL   Eosinophils Absolute 0.1 0.0 - 0.7 K/uL   Basophils Absolute 0.0 0.0 - 0.1 K/uL   RBC Morphology POLYCHROMASIA PRESENT     Comment: TARGET CELLS Spherocytes present   Comprehensive metabolic panel     Status: Abnormal   Collection Time: 08/28/17  1:36 PM  Result Value Ref Range   Sodium 138 135 - 145 mmol/L   Potassium 3.8 3.5 - 5.1 mmol/L   Chloride 105 101 - 111 mmol/L   CO2 24 22 - 32 mmol/L   Glucose, Bld 89 65 - 99 mg/dL   BUN 9 6 - 20 mg/dL   Creatinine, Ser 0.79 0.44 - 1.00 mg/dL   Calcium 9.1 8.9 - 10.3 mg/dL   Total Protein 7.4 6.5 - 8.1 g/dL   Albumin 4.1 3.5 - 5.0 g/dL   AST 14 (L) 15 - 41 U/L   ALT 16 14 - 54 U/L   Alkaline Phosphatase 63 38 - 126 U/L   Total Bilirubin 0.3 0.3 - 1.2 mg/dL   GFR calc non Af Amer >60 >60 mL/min   GFR calc Af Amer >60 >60 mL/min    Comment: (NOTE) The eGFR has been calculated using the CKD EPI equation. This calculation has not been validated in all clinical situations. eGFR's persistently <60 mL/min signify possible Chronic Kidney Disease.    Anion gap 9 5 - 15  I-Stat Beta hCG blood, ED ([facility], [facility], [facility] only)     Status: None   Collection Time: 08/28/17  1:50 PM  Result Value Ref Range   I-stat hCG, quantitative <5.0 <5 mIU/mL   Comment 3            Comment:   GEST. AGE      CONC.  (mIU/mL)   <=1 WEEK        5 - 50     2 WEEKS       50 - 500     3 WEEKS       100 - 10,000     4 WEEKS     1,000 - 30,000        FEMALE AND NON-PREGNANT FEMALE:     LESS THAN 5 mIU/mL   Vitamin B12     Status: None   Collection Time: 08/28/17  2:22 PM  Result Value Ref Range   Vitamin B-12 372 180 - 914 pg/mL    Comment: (NOTE) This assay is not validated for testing neonatal or myeloproliferative syndrome specimens  for Vitamin B12 levels. Performed at [facility], [place] [address] [place], [place] [address]   Folate     Status: None   Collection Time: 08/28/17  2:22 PM  Result Value Ref Range   Folate 16.5 >5.9 ng/mL    Comment: Performed at [facility], [place] [address] [place], [place] [address]  Iron and TIBC     Status: Abnormal   Collection Time: 08/28/17  2:22 PM  Result Value Ref Range   Iron 28 28 - 170 ug/dL   TIBC 428 250 - 450 ug/dL   Saturation Ratios 7 (L) 10.4 - 31.8 %   UIBC 400 ug/dL    Comment: Performed at [facility], [place] [address], [place], [place] [address]  Ferritin     Status: Abnormal   Collection Time: 08/28/17  2:22 PM  Result Value Ref Range   Ferritin 2 (L) 11 - 307 ng/mL    Comment: Performed at [facility], [place] [address] [place], [place] [address]  Reticulocytes     Status: Abnormal   Collection Time: 08/28/17  2:22 PM  Result Value Ref Range   Retic Ct Pct 2.9 0.4 - 3.1 %   RBC. 3.39 (L) 3.87 - 5.11 MIL/uL   Retic Count, Absolute 98.3 19.0 - 186.0 K/uL  Prepare RBC     Status: None   Collection Time: 08/28/17  4:00 PM  Result Value Ref Range   Order Confirmation ORDER PROCESSED BY BLOOD BANK   Influenza panel by PCR (type A & B)     Status: None   Collection Time: 08/28/17  5:14 PM  Result Value Ref Range   Influenza A By PCR NEGATIVE NEGATIVE   Influenza B By PCR NEGATIVE NEGATIVE    Comment: (NOTE) The Xpert Xpress Flu assay is intended as an aid in the diagnosis of  influenza and should not be used as a sole basis for treatment.  This  assay is FDA approved for nasopharyngeal swab specimens only. Nasal  washings and aspirates are unacceptable for Xpert Xpress Flu testing.   Prepare RBC     Status: None   Collection Time: 08/28/17  6:01 PM  Result Value Ref Range   Order Confirmation ORDER PROCESSED BY BLOOD BANK   Magnesium     Status: None   Collection Time: 08/28/17  8:32  PM  Result Value Ref Range   Magnesium 1.9  1.7 - 2.4 mg/dL  Troponin I     Status: None   Collection Time: 08/28/17  8:32 PM  Result Value Ref Range   Troponin I <0.03 <0.03 ng/mL  HIV antibody (Routine Testing)     Status: None   Collection Time: 08/29/17  2:56 AM  Result Value Ref Range   HIV Screen 4th Generation wRfx Non Reactive Non Reactive    Comment: (NOTE) Performed At: [facility] [place], [place] [id number] Rush Farmer MD [id number]   Basic metabolic panel     Status: Abnormal   Collection Time: 08/29/17  2:56 AM  Result Value Ref Range   Sodium 136 135 - 145 mmol/L   Potassium 3.8 3.5 - 5.1 mmol/L   Chloride 104 101 - 111 mmol/L   CO2 25 22 - 32 mmol/L   Glucose, Bld 105 (H) 65 - 99 mg/dL   BUN 8 6 - 20 mg/dL   Creatinine, Ser 0.94 0.44 - 1.00 mg/dL   Calcium 8.6 (L) 8.9 - 10.3 mg/dL   GFR calc non Af Amer >60 >60 mL/min   GFR calc Af Amer >60 >60 mL/min    Comment: (NOTE) The eGFR has been calculated using the CKD EPI equation. This calculation has not been validated in all clinical situations. eGFR's persistently <60 mL/min signify possible Chronic Kidney Disease.    Anion gap 7 5 - 15  CBC     Status: Abnormal   Collection Time: 08/29/17  2:56 AM  Result Value Ref Range   WBC 7.7 4.0 - 10.5 K/uL   RBC 3.78 (L) 3.87 - 5.11 MIL/uL   Hemoglobin 7.9 (L) 12.0 - 15.0 g/dL   HCT 26.5 (L) 36.0 - 46.0 %   MCV 70.1 (L) 78.0 - 100.0 fL   MCH 20.9 (L) 26.0 - 34.0 pg   MCHC 29.8 (L) 30.0 - 36.0 g/dL   RDW 22.5 (H) 11.5 - 15.5 %   Platelets 368 150 - 400 K/uL  Troponin I (q 6hr x 3)     Status: None   Collection Time: 08/29/17  2:56 AM  Result Value Ref Range   Troponin I <0.03 <0.03 ng/mL  Prepare RBC     Status: None   Collection Time: 08/29/17  9:16 AM  Result Value Ref Range   Order Confirmation ORDER PROCESSED BY BLOOD BANK   Troponin I (q 6hr x 3)     Status: None   Collection Time: 08/29/17  9:25 AM  Result Value Ref Range   Troponin I <0.03 <0.03 ng/mL   ECHOCARDIOGRAM COMPLETE     Status: Abnormal   Collection Time: 08/29/17  3:04 PM  Result Value Ref Range   Weight 5,372.17 oz   Height 65 in   BP 154/81 mmHg   LV PW d 11 (A) 0.6 - 1.1 mm   FS 42 28 - 44 %   LA vol 57.2 mL   Ao-asc 36 cm   LA ID, A-P, ES 46 mm   IVS/LV PW RATIO, ED 1.27    LV e' LATERAL 13.8 cm/s   LA diam index 1.68 cm/m2   LA vol A4C 54.3 ml   E decel time 225 msec   LVOT diameter 22 mm   LVOT area 3.80 cm2   MV pk E vel 1.5 m/s   LA vol index 20.9 mL/m2   MV Dec 225    LA diam  end sys 46.00 mm   TDI e' medial 14.10    TDI e' lateral 13.80    Lateral S' vel 25.40 cm/sec   TAPSE 21.40 mm  Hemoglobin and hematocrit, blood     Status: Abnormal   Collection Time: 08/29/17  5:13 PM  Result Value Ref Range   Hemoglobin 9.0 (L) 12.0 - 15.0 g/dL   HCT 30.6 (L) 36.0 - 46.0 %      Constitutional:  LMP 08/18/2017    Musculoskeletal: Strength & Muscle Tone: within normal limits Gait & Station: normal Patient leans: N/A  Psychiatric Specialty Exam: Physical Exam  ROS  Last menstrual period 08/18/2017.There is no height or weight on file to calculate BMI.  General Appearance: Casual  Eye Contact:  Fair  Speech:  Fast but clear and coherent.  Volume:  Increased  Mood:  Anxious and Dysphoric  Affect:  Labile  Thought Process:  Descriptions of Associations: Circumstantial  Orientation:  Full (Time, Place, and Person)  Thought Content:  Rumination  Suicidal Thoughts:  No  Homicidal Thoughts:  No  Memory:  Immediate;   Good Recent;   Good Remote;   Good  Judgement:  Good  Insight:  Good  Psychomotor Activity:  Increased  Concentration:  Concentration: Fair and Attention Span: Fair  Recall:  Good  Fund of Knowledge:  Good  Language:  Good  Akathisia:  No  Handed:  Right  AIMS (if indicated):     Assets:  Communication Skills Desire for Improvement Housing Social Support  ADL's:  Intact  Cognition:  WNL  Sleep:      Assessment: Axis I:  Bipolar disorder type I.  Anxiety disorder NOS.  Axis III:  Past Medical History:  Diagnosis Date  . Anemia   . Anxiety   . Bipolar 1 disorder ([place])   . Depression   . Fibroids   . High blood pressure   . Mental disorder   . PCOS (polycystic ovarian syndrome)      Plan:  Admit to PHP.  I review her collect information including recent discharge summary from the hospital blood work results.  Her hemoglobin is improved from the past.  Though she still feel tired and fatigued.  She is tolerating her medication very well.  Her last lithium level was 0.5.  I would increase lithium 900 mg twice a day.  She seems some improvement with the [name].  Currently she is taking 1 mg I recommended to try 2 mg.  Samples provided.  Encouraged to participate in group milieu therapy.  Continue Lamictal 150 mg twice a day.  Patient has no rash, itching, tremors or shakes.  [facility] 09/09/2017

## 2017-09-09 NOTE — Therapy (Signed)
[place], [place], [address] Phone: [phone]   Fax:  [phone]  Occupational Therapy Evaluation & Treatment  Patient Details  Name: Christine [name] MRN: [id number] Date of Birth: 03-25-1976 Referring Provider: Dr. [name]   Encounter Date: 09/09/2017  OT End of Session - 09/09/17 2050    Visit Number  1    Number of Visits  6    Date for OT Re-Evaluation  10/03/17    Authorization Type  BCBS    OT Start Time  1030    OT Stop Time  1130    OT Time Calculation (min)  60 min    Activity Tolerance  Patient tolerated treatment well    Behavior During Therapy  [facility] for tasks assessed/performed       Past Medical History:  Diagnosis Date  . Anemia   . Anxiety   . Bipolar 1 disorder ([place])   . Depression   . Fibroids   . High blood pressure   . Mental disorder   . PCOS (polycystic ovarian syndrome)     Past Surgical History:  Procedure Laterality Date  . DIAGNOSTIC LAPAROSCOPY    . DILATION AND CURETTAGE OF UTERUS    . HYSTEROSCOPY W/D&C  07/22/2011   Procedure: DILATATION AND CURETTAGE /HYSTEROSCOPY;  Surgeon: Cyril Mourning, MD;  Location: [facility];  Service: Gynecology;;    There were no vitals filed for this visit.  Subjective Assessment - 09/09/17 2050    Currently in Pain?  No/denies        [facility] OT Assessment - 09/09/17 2050      Assessment   Medical Diagnosis  Bipolar I    Referring Provider  Dr. [name]      Precautions   Precautions  None      Restrictions   Weight Bearing Restrictions  No      Balance Screen   Has the patient fallen in the past 6 months  No    Has the patient had a decrease in activity level because of a fear of falling?   No    Is the patient reluctant to leave their home because of a fear of falling?   No            OT assessment Diagnosis: Bipolar I, mixed/moderate Past medical history: n/a  Living situation: With  spouse and 2 children ADLs: independent Work: trying to work from home Leisure: Firefighter, Control and instrumentation engineer Social support: husband, family Struggles: coping strategies, little things bother her OT goal: improve coping strategies.   General Causality Orientation Scale    Subscore Percentile Score  Autonomy 63 80.74  Control 40 17.14  Impersonal 62 92.49    Motivation Type  Motivation type Explanation    Impersonal/amotivational  There is a lack of connection between any of the individual's behavior and his or her personal goals. The individual is likely in a passivity state or manifests non-goal-directed behavior. This is considered a type of motivational deficit and warrants motivational interventions.        Assessment:  Patient demonstrates impersonal/amotivational behaviors. This is considered a type of motivational deficit and warrants motivational interventions. Patient will benefit from occupational therapy intervention in order to improve time management, financial management, stress management, job readiness skills, social skills, sleep hygiene, exercise and healthy eating habits, and health management skills and other psychosocial skills needed for preparation to return to full time community living and to be a  productive community member.     Plan:  Patient will participate in skilled occupational therapy sessions individually or in a group setting to improve coping skills, psychosocial skills, and emotional skills required to return to prior level of function as a productive community member. Treatment will be 1-2 times per week for 2-6 weeks.        OT Treatment Session: Job Readiness  S: "My job would be being a Mom."   O: Patient actively participated in the following skilled occupational therapy treatment session this date.  -Job Readiness: Pt educated on soft skills vs hard skills and importance of each to the job environment. Defined a job as an occupation OR a job role (Mom,  wife, husband, friend, etc). Pt actively participated in discussion of job interview scenarios and how to prepare for this situation. Also discussed interpersonal skills required for the job and how to effectively communicate with coworker and supervisors or family/friends/etc. Pt actively participated group activity discussing specific soft skills and determining a positive or negative example of each skill discussed. Also completed worksheet and group discussion outlining the benefits of teamwork as well as roles one may see on a team.  A: Patient participated in skilled occupational therapy group for job readiness skills this date.  Patient was engaged in group discussion and open to strategies introduced.    P: Continue participation in skilled occupational therapy groups  1-2 times per week for 2 weeks in order to gain the necessary skills needed to return to full time community living and learn effective coping strategies to be a productive community resident.            OT Short Term Goals - 09/09/17 2052      OT SHORT TERM GOAL #1   Title  Patient will be educated on strategies to improve psychosocial skills needed to participate fully in all daily, work, and leisure activities.    Time  3    Period  Weeks    Status  New    Target Date  10/03/17      OT SHORT TERM GOAL #2   Title  Patient will be educated on a HEP and independent with implementation of HEP.    Time  3    Period  Weeks    Status  New      OT SHORT TERM GOAL #3   Title  Patient will independently apply psychosocial skills and coping mechanisms to her daily activities in order to function independently.    Time  3    Period  Weeks    Status  New               Plan - 09/09/17 2051    Occupational performance deficits (Please refer to evaluation for details):  ADL's;IADL's;Rest and Sleep;Work;Leisure;Social Participation    Rehab Potential  Good    OT Frequency  2x / week    OT Duration  -- 3 weeks     OT Treatment/Interventions  Self-care/ADL training;Patient/family education;Other (comment) coping skills training, psychosocial skills training, community reintegration    Clinical Decision Making  Limited treatment options, no task modification necessary    Consulted and Agree with Plan of Care  Patient       Patient will benefit from skilled therapeutic intervention in order to improve the following deficits and impairments:  Other (comment)(decreased coping skills, decreased psychosocial skills, decreased participation in ADLs)  Visit Diagnosis: Bipolar I disorder ([place])  Difficulty coping  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 Menorrhagia with regular cycle   . Symptomatic anemia 08/28/2017  . Acute blood loss anemia 08/28/2017  . Bipolar disorder ([place]) 08/28/2017  . Anxiety 08/28/2017  . Depression 08/28/2017  . Fibroids 08/28/2017  . PCOS (polycystic ovarian syndrome) 08/28/2017  . Fever 08/28/2017  . HTN (hypertension) 08/28/2017   Guadelupe Sabin, OTR/L  [phone] 09/09/2017, 8:53 PM  [place], [place], [address] Phone: [phone]   Fax:  [phone]  Name: Jahnai Slingerland MRN: [id number] Date of Birth: Dec 03, 1975
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
## 2017-09-10 ENCOUNTER — Encounter (HOSPITAL_COMMUNITY): Payer: Self-pay

## 2017-09-10 ENCOUNTER — Other Ambulatory Visit (HOSPITAL_COMMUNITY): Payer: BC Managed Care – PPO | Admitting: Licensed Clinical Social Worker

## 2017-09-10 VITALS — BP 130/78 | HR 100 | Ht 65.0 in | Wt <= 1120 oz

## 2017-09-10 DIAGNOSIS — F3162 Bipolar disorder, current episode mixed, moderate: Secondary | ICD-10-CM

## 2017-09-10 NOTE — Psych (Signed)
   [facility] PHP THERAPIST PROGRESS NOTE  Christine [name] [id number]  Session Time: 9-2  Participation Level: Active  Behavioral Response: CasualAlertAnxious and Depressed  Type of Therapy: Group Therapy, Psychotherapy,Activity therapy, Spiritual Care, Relaxation Group  Treatment Goals addressed: Coping  Interventions: CBT, DBT, Supportive and Reframing  Summary:  9:00 - 10:30 Clinician led check-in regarding current stressors and situation, and review of patient completed daily inventory. Clinician utilized active listening and empathetic response and validated patient emotions. Clinician facilitated processing group on pertinent issues.  10:30 -12:00 Spiritual care group  12:00 - 12:45 Reflection group: Patients encouraged to practice skills and interpersonal techniques or work on mindfulness and relaxation techniques. The importance of self-care and making skills part of a routine to increase usage were stressed.  12:45 - 1:50 Relaxation group: Cln Jan Fireman led yoga group focused on retraining the body's response to stress.  1:50 - 2:00 Clinician led check-out. Clinician assessed for immediate needs, medication compliance and efficacy, and safety concerns.     Suicidal/Homicidal: Nowithout intent/plan  Therapist Response: Christine [name] is a 42 y.o. female who presents with depression symptoms. Patient arrived within time allowed and reports that she is in a "blah mood and it is bringing me down." Patient rates her mood at a 3 on a scale of 1-10 with 10 being great. Patient reports that she had an okay evening with her daughters and husband. Patient shared that she helped her daughter with her homework and was thankful that her husband cleaned the house for her while she was gone. Patient stated that she was frustrated last night, but believes it was due to having an overwhelming day starting group again. Patient engaged in activity and discussion. Patient demonstrates some progress  as evidenced by stating that she is going to continue to practice skills learned in group so that she can fully get all she needs out of group. Patient denies SI/HI/self-harm at the end of group.   Plan: Patient will continue in PHP and medication management while working on decreasing depression and anxiety symptoms and increasing distress tolerance and emotional regulation skills.  Diagnosis: Bipolar disorder, current episode mixed, moderate ([name]) [F31.62]    1. Bipolar disorder, current episode mixed, moderate ([name])     [name], LPCA 09/10/2017

## 2017-09-10 NOTE — Psych (Signed)
   [facility] PHP THERAPIST PROGRESS NOTE  Christine [name] [id number]  Session Time: 9-2  Participation Level: Active  Behavioral Response: CasualAlertAnxious and Depressed  Type of Therapy: Group Therapy, Psychotherapy, Psychoeducation, Activity therapy, Occupational Therapy  Treatment Goals addressed: Coping  Interventions: CBT, DBT, Supportive and Reframing  Summary:  9:00 - 10:15 Clinician led check-in regarding current stressors and situation, and review of patient completed daily inventory. Clinician utilized active listening and empathetic response and validated patient emotions. Clinician facilitated processing group on pertinent issues.  10:30 -11:30: OT Group  11:30 -12:45 Clinician led psychotherapy group on difficult emotions and how group members process them.  12:45 - 1:50 Clinician introduced topic of "Feelings and Emotions." Clincian led psychotherapy on how to contextualize emotions and not to attach them when they are unfounded. Cln reviewed multiple metaphors that may help pt remember this concept.  1:50 - 2:00 Clinician led check-out. Clinician assessed for immediate needs, medication compliance and efficacy, and safety concerns.    Suicidal/Homicidal: Nowithout intent/plan  Therapist Response: Christine [name] is a 42 y.o. female who presents with depression symptoms. Patient arrived within time allowed and reports she is feeling "depressed." Patient rates her mood at a 3 on a 1- 10 scale with 10 being great. Pt states she is frustrated and judging herself for being back in PHP, however is trying to "not ignore" things. Pt shares she reached out to a friend and the friend, her husband, and her daughters have all been encouraging about her seeking intensive treatment.  Patient engaged in activity and discussion. Patient demonstrates some progress as evidenced by participating in first group session. Patient denies SI/HI/self-harm thoughts at end of session.  Plan: Patient  will continue in PHP and medication management while working on decreasing depression and anxiety symptoms and increasing distress tolerance and emotional regulation skills.  Diagnosis: Bipolar disorder, current episode mixed, moderate ([place]) [F31.62]    1. Bipolar disorder, current episode mixed, moderate ([place])   2. Bipolar I disorder ([place])     [name], LCSW 09/10/2017

## 2017-09-10 NOTE — Progress Notes (Signed)
Patient presents with sad affect, depressed mood and admitted she returned to [facility] at the suggestion of her therapist, Binnie Rail due to admission she had started some self-harm with cutting again.  Patient reported this has not occurred since the previous week on 09/05/17 and that she agreed to return to [facility] as she did not want to end up "back in the hospital".  Patient stated she was feeling overwhelmed and knew returning to [facility] was probably for the best after a recent hospitalization for anemia.  Patient reported she would be following up in the coming weeks with a GI provider and hemologist due to concern for low hemoglobin and she also needed an iron infusion as she had never followed up on this upon moving over the past year.  Patient reports she has an appointment with a hemologist 09/15/17 and denies any current suicidal or homicidal ideations, no auditory or visual hallucinations and no plans to harm self or others at this time.  Patient reported she did feel she needed to focus on herself and admitted to current internal struggle and related to depression and providers thinking she needed to have a hysterectomy.  Patient shared she had 4 past miscarriages, has adopted two children, was never able to have her own, but just has not come to grips with needing a hysterectomy due to a long history of dealing with fibroid cysts and losses.  Patient stated her husband and her love being parents and are considering adopting again but she is concerned being "bipolar" diagnosed now may interfere with this process.  Patient reported [name] made some changes with her medications on 09/09/17 and will start these today once she picks up refills. Patient agreed to contact this nurse if any thoughts of wanting to harm self or others, worsening of symptoms or problems with medication changes.  Patient reported no plans to harm self or others currently and shared she is hopeful revisiting PHP will help her get things back  on course and plans to follow up with pending medical appointments.  Patient returned to group with no other concerns shared today and agreed to share with PHP staff her fears and what she feels she is struggling with internally at this time.

## 2017-09-10 NOTE — Progress Notes (Signed)
Spiritual care group 09/10/2017 11:00-12:00  Facilitated by Chaplain Claryssa Sandner, MDiv    Group focused on topic of "self-care"  Patients engaged in facilitated discussion about topic.  Explored quotes related to self care and chose one which they agreed with and one which they disliked.  Engaged in discussion around quote choices and their experience / understanding of care for themselves.   

## 2017-09-11 ENCOUNTER — Other Ambulatory Visit (HOSPITAL_COMMUNITY): Payer: Self-pay

## 2017-09-11 ENCOUNTER — Other Ambulatory Visit (HOSPITAL_COMMUNITY): Payer: BC Managed Care – PPO | Admitting: Specialist

## 2017-09-11 ENCOUNTER — Ambulatory Visit (HOSPITAL_COMMUNITY): Payer: Self-pay

## 2017-09-11 ENCOUNTER — Other Ambulatory Visit (HOSPITAL_COMMUNITY): Payer: BC Managed Care – PPO | Admitting: Licensed Clinical Social Worker

## 2017-09-11 DIAGNOSIS — F0789 Other personality and behavioral disorders due to known physiological condition: Secondary | ICD-10-CM

## 2017-09-11 DIAGNOSIS — R4589 Other symptoms and signs involving emotional state: Secondary | ICD-10-CM

## 2017-09-11 DIAGNOSIS — F3162 Bipolar disorder, current episode mixed, moderate: Secondary | ICD-10-CM

## 2017-09-11 NOTE — Therapy (Addendum)
[place], [place], [address] Phone: [phone]   Fax:  [phone]  Occupational Therapy Treatment  Patient Details  Name: Christine [name] MRN: [id number] Date of Birth: July 13, 1976 Referring Provider: Dr. [name]   Encounter Date: 09/11/2017  Time in 1030 Time out 1130 Total 60 min  OT End of Session - 09/11/17 2145    Visit Number  2    Number of Visits  6    Date for OT Re-Evaluation  10/03/17    Authorization Type  [employer]       Past Medical History:  Diagnosis Date  . Anemia   . Anxiety   . Bipolar 1 disorder ([place])   . Depression   . Fibroids   . High blood pressure   . Mental disorder   . PCOS (polycystic ovarian syndrome)     Past Surgical History:  Procedure Laterality Date  . DIAGNOSTIC LAPAROSCOPY    . DILATION AND CURETTAGE OF UTERUS    . HYSTEROSCOPY W/D&C  07/22/2011   Procedure: DILATATION AND CURETTAGE /HYSTEROSCOPY;  Surgeon: Cyril Mourning, MD;  Location: [facility];  Service: Gynecology;;    There were no vitals filed for this visit.  Subjective Assessment - 09/11/17 2145    Currently in Pain?  No/denies          S:  I dont sleep well.  I dont have any energy.  O: patient participated in skilled OT group focusing on improving sleep hygiene. Patient was educated and discussed benefits of getting enough sleep, tips to improve sleep including routines, environment, diet, exercises, calming activities. Patient was educated on use of sleep diary to identify current sleep patterns and strategies to improve sleep routine.  A: Patient currently sleeps 5 hours per night, which she feels is not sufficient.  She commits to using white noise to drown out other noise for an improved environment for sleep. 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                  OT  Education - 09/11/17 2145    Education provided  Yes    Education Details  Educated paitent on strategies for improved sleep hygiene    Person(s) Educated  Patient    Methods  Explanation;Handout    Comprehension  Verbalized understanding       OT Short Term Goals - 09/11/17 2146      OT SHORT TERM GOAL #1   Title  Patient will be educated on strategies to improve psychosocial skills needed to participate fully in all daily, work, and leisure activities.    Time  3    Period  Weeks    Status  On-going      OT SHORT TERM GOAL #2   Title  Patient will be educated on a HEP and independent with implementation of HEP.    Time  3    Period  Weeks    Status  On-going      OT SHORT TERM GOAL #3   Title  Patient will independently apply psychosocial skills and coping mechanisms to her daily activities in order to function independently.    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 Menorrhagia with regular cycle   . Symptomatic anemia 08/28/2017  . Acute blood loss anemia 08/28/2017  . Bipolar disorder ([place]) 08/28/2017  . Anxiety 08/28/2017  . Depression 08/28/2017  . Fibroids 08/28/2017  . PCOS (polycystic ovarian syndrome) 08/28/2017  . Fever 08/28/2017  . HTN (hypertension) 08/28/2017    Vangie Bicker, [place], OTR/L [phone]  09/11/2017, 9:48 PM  [place], [place], [address] Phone: [phone]   Fax:  [phone]  Name: Vola Beneke MRN: [id number] Date of Birth: [birth year]

## 2017-09-11 NOTE — Psych (Signed)
   [facility] PHP THERAPIST PROGRESS NOTE  Christine [name] [id number]  Session Time: 9-2  Participation Level: Active  Behavioral Response: CasualAlertAnxious and Depressed  Type of Therapy: Group Therapy, Psychotherapy, Activity therapy, Psychoeducation, OT, Art Therapy  Treatment Goals addressed: Coping  Interventions: CBT, DBT, Supportive and Reframing  Summary:  9:00 -10:30: Clinician led check-in regarding current stressors and situation, and review of patient completed daily inventory. Clinician utilized active listening and empathetic response and validated patient emotions. Clinician facilitated processing group on pertinent issues.  10:30 - 11:30: OT  11:30-12:15: Clinician reviewed dialectics and discussed "Opposites to Balance" handout. Group shared ways in which these opposites present and what makes them difficult to balance. Cln reminded group of "And" as a useful tool to remind [place] about joining "opposites."  12:15 - 1:00: Reflection group: Patients encouraged to practice skills and interpersonal techniques or work on mindfulness and relaxation techniques. The importance of self-care and making skills part of a routine to increase usage when stressed.  1:00 - 1:50: Clinician led art therapy activity on mind mapping.  1:50 - 2:00 Clinician led check-out. Clinician assessed for immediate needs, medication compliance and efficacy, and safety concerns.   Suicidal/Homicidal: Nowithout intent/plan  Therapist Response: [name] is a 43 y.o. female who presents with depression symptoms. Patient arrived within time allowed and reports that she is feeling "low and down." Patient rates her mood at a 3 on a scale of 1-10 with 10 being great. Pt reports it was a tough afternoon. Pt reports she understands that she needs to continue to work on stress management. Pt reports she felt overwhelmed and cut last night and did not use any skills. Patient engaged in activity and discussion.  Patient demonstrates limited progress as evidenced cutting instead of using skills to manage emotions. Patient denies SI/HI/self-harm at the end of group.   Plan: Patient will continue in PHP and medication management while working on decreasing depression and anxiety symptoms and increasing distress tolerance and emotional regulation skills.  Diagnosis: Bipolar disorder, current episode mixed, moderate ([place]) [F31.62]    1. Bipolar disorder, current episode mixed, moderate ([place])   2. 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 Difficulty coping     [name], LPCA 09/11/2017

## 2017-09-12 ENCOUNTER — Other Ambulatory Visit (HOSPITAL_COMMUNITY): Payer: Self-pay

## 2017-09-12 ENCOUNTER — Other Ambulatory Visit (HOSPITAL_COMMUNITY): Payer: BC Managed Care – PPO | Admitting: Licensed Clinical Social Worker

## 2017-09-12 DIAGNOSIS — F3162 Bipolar disorder, current episode mixed, moderate: Secondary | ICD-10-CM

## 2017-09-12 NOTE — Psych (Signed)
   [facility] PHP THERAPIST PROGRESS NOTE  Christine [name] [id number]  Session Time: 9-1  Participation Level: Active  Behavioral Response: CasualAlertAnxious and Depressed  Type of Therapy: Group Therapy, Psychotherapy, Activity therapy, Psychoeducation  Treatment Goals addressed: Coping  Interventions: CBT, DBT, Supportive and Reframing  Summary:  9:00 - 10:30 Clinician led check-in regarding current stressors and situation, and review of patient completed daily inventory. Clinician utilized active listening and empathetic response and validated patient emotions. Clinician facilitated processing group on pertinent issues.  10:30 -12:00: Clinician introduced topic of "Distress Tolerance". Clinician discussed "STOP" and "TIP" skills and how/when patients can employ this method to help. Patients identified when these techniques may be helpful in their personal lives.  12:00- 12:50 Clinician showed "How to practice emotional first aid" TED talk. Group discussed how the talk spoke to them.  12:50 - 1:00 Clinician led check-out. Clinician assessed for immediate needs, medication compliance and efficacy, and safety concerns.    Suicidal/Homicidal: Nowithout intent/plan  Therapist Response: Christine [name] is a 42 y.o. female who presents with depression symptoms. Patient arrived within time allowed and reports that she is feeling "down and anxious." Patient rates her mood at a 3.5 on a scale of 1-10 with 10 being great. Patient reports that she did a lot of thinking last night and was able to talk to her husband about what she has learned in group. Patient also participated in some self-care and baked cupcakes. Patient is still struggling in her level of confidence to try new things. Patient engaged in activity and discussion. Patient demonstrates some progress as evidenced by stating that she is going to pick up the habit of baking again, as that is something she loves to do. Patient also stated  that she is going to try progression muscle relaxation techniques over the weekend whenever she feels down and overwhelmed. Patient denies SI/HI/self-harm thoughts at the end of group   Plan: Patient will continue in [facility] and medication management while working on decreasing depression and anxiety symptoms and increasing distress tolerance and emotional regulation skills.  Diagnosis: Bipolar disorder, current episode mixed, moderate ([place]) [F31.62]    1. Bipolar disorder, current episode mixed, moderate ([place])     [name], LCSW 09/12/2017

## 2017-09-15 ENCOUNTER — Other Ambulatory Visit (HOSPITAL_COMMUNITY): Payer: BC Managed Care – PPO | Admitting: Licensed Clinical Social Worker

## 2017-09-15 ENCOUNTER — Other Ambulatory Visit (HOSPITAL_COMMUNITY): Payer: Self-pay

## 2017-09-15 DIAGNOSIS — F3162 Bipolar disorder, current episode mixed, moderate: Secondary | ICD-10-CM

## 2017-09-15 NOTE — Psych (Signed)
   [facility] PHP THERAPIST PROGRESS NOTE  Safari Cinque [id number]  Session Time: 9-2  Participation Level: Active  Behavioral Response: CasualAlertAnxious and Depressed  Type of Therapy: Group Therapy, Psychotherapy, Activity therapy, Psychoeducation, medication group  Treatment Goals addressed: Coping  Interventions: CBT, DBT, Supportive and Reframing  Summary:  9:00 - 10:30: Pharmacist discussed medication with group and answered questions.  10:30 -11:30: Clinician led check-in regarding current stressors and situation, and review of patient completed daily inventory. Clinician utilized active listening and empathetic response and validated patient emotions. Clinician facilitated processing group on pertinent issues.  11:30 - 12:15 Clinician led psychoeducation group on financial management and how the stress of finances affects [place].  12:15 -1:00: Reflection group: Patients encouraged to practice skills and interpersonal techniques or work on mindfulness and relaxation techniques. The importance of self-care and making skills part of a routine to increase usage were stressed.  1:00 - 1:50 Clinician discussed topic of self esteem and barriers to promoting self worth. Cln utilized checking the facts, best friend test, positive self mantras, and discussion of trust foundations. Group discussed how childhood, past experiences, and poor relationships effects feelings of self worth.  1:50 - 2:00 Clinician led check-out. Clinician assessed for immediate needs, medication compliance and efficacy, and safety concerns.    Suicidal/Homicidal: Nowithout intent/plan  Therapist Response: Christine [name] is a 42 y.o. female who presents with depression symptoms. Patient arrived within time allowed and reports that she is feeling "nervous." Patient rates her mood at a 4 on a scale of 1-10 with 10 being great. Pt reports the weekend started rough, hearing financial news that was upsetting. Pt reports she  tried to "control impulses" and baked and knitted to do that. Pt shares her husband was helpful and encouraging when she struggled. Patient engaged in activity and discussion. Pt will continue to work on offering herself grace and not minimizing her successes. Patient demonstrates progress as evidenced by identifying some of her strengths . Patient denies SI/HI/self-harm at the end of group.   Plan: Patient will continue in PHP and medication management while working on decreasing depression and anxiety symptoms and increasing distress tolerance and emotional regulation skills.  Diagnosis: Bipolar disorder, current episode mixed, moderate ([place]) [F31.62]    1. Bipolar disorder, current episode mixed, moderate ([place])     [name], LCSW 09/15/2017

## 2017-09-16 ENCOUNTER — Inpatient Hospital Stay: Payer: BC Managed Care – PPO

## 2017-09-16 ENCOUNTER — Inpatient Hospital Stay: Payer: BC Managed Care – PPO | Attending: Hematology and Oncology | Admitting: Hematology and Oncology

## 2017-09-16 ENCOUNTER — Encounter: Payer: Self-pay | Admitting: Hematology and Oncology

## 2017-09-16 ENCOUNTER — Telehealth: Payer: Self-pay | Admitting: Hematology and Oncology

## 2017-09-16 ENCOUNTER — Ambulatory Visit (HOSPITAL_COMMUNITY): Payer: Self-pay

## 2017-09-16 VITALS — BP 157/98 | HR 95 | Temp 98.8°F | Resp 18 | Ht 65.0 in | Wt 337.2 lb

## 2017-09-16 DIAGNOSIS — E282 Polycystic ovarian syndrome: Secondary | ICD-10-CM | POA: Diagnosis not present

## 2017-09-16 DIAGNOSIS — Z79899 Other long term (current) drug therapy: Secondary | ICD-10-CM | POA: Insufficient documentation

## 2017-09-16 DIAGNOSIS — F419 Anxiety disorder, unspecified: Secondary | ICD-10-CM | POA: Diagnosis not present

## 2017-09-16 DIAGNOSIS — N920 Excessive and frequent menstruation with regular cycle: Secondary | ICD-10-CM | POA: Insufficient documentation

## 2017-09-16 DIAGNOSIS — D62 Acute posthemorrhagic anemia: Secondary | ICD-10-CM

## 2017-09-16 DIAGNOSIS — D500 Iron deficiency anemia secondary to blood loss (chronic): Secondary | ICD-10-CM | POA: Insufficient documentation

## 2017-09-16 DIAGNOSIS — D259 Leiomyoma of uterus, unspecified: Secondary | ICD-10-CM

## 2017-09-16 DIAGNOSIS — F319 Bipolar disorder, unspecified: Secondary | ICD-10-CM | POA: Diagnosis not present

## 2017-09-16 DIAGNOSIS — I10 Essential (primary) hypertension: Secondary | ICD-10-CM | POA: Insufficient documentation

## 2017-09-16 LAB — CBC WITH DIFFERENTIAL (CANCER CENTER ONLY)
Basophils Absolute: 0 10*3/uL (ref 0.0–0.1)
Basophils Relative: 0 %
Eosinophils Absolute: 0.1 10*3/uL (ref 0.0–0.5)
Eosinophils Relative: 1 %
HCT: 33.6 % — ABNORMAL LOW (ref 34.8–46.6)
Hemoglobin: 10.5 g/dL — ABNORMAL LOW (ref 11.6–15.9)
Lymphocytes Relative: 16 %
Lymphs Abs: 1.7 10*3/uL (ref 0.9–3.3)
MCH: 23.2 pg — ABNORMAL LOW (ref 25.1–34.0)
MCHC: 31.1 g/dL — ABNORMAL LOW (ref 31.5–36.0)
MCV: 74.3 fL — ABNORMAL LOW (ref 79.5–101.0)
Monocytes Absolute: 0.6 10*3/uL (ref 0.1–0.9)
Monocytes Relative: 6 %
Neutro Abs: 8 10*3/uL — ABNORMAL HIGH (ref 1.5–6.5)
Neutrophils Relative %: 77 %
Platelet Count: 341 10*3/uL (ref 145–400)
RBC: 4.52 MIL/uL (ref 3.70–5.45)
RDW: 32.6 % — ABNORMAL HIGH (ref 11.2–14.5)
WBC Count: 10.4 10*3/uL — ABNORMAL HIGH (ref 3.9–10.3)

## 2017-09-16 LAB — CMP (CANCER CENTER ONLY)
ALT: 12 U/L (ref 0–55)
AST: 10 U/L (ref 5–34)
Albumin: 4 g/dL (ref 3.5–5.0)
Alkaline Phosphatase: 68 U/L (ref 40–150)
Anion gap: 9 (ref 3–11)
BUN: 8 mg/dL (ref 7–26)
CO2: 21 mmol/L — ABNORMAL LOW (ref 22–29)
Calcium: 9.4 mg/dL (ref 8.4–10.4)
Chloride: 108 mmol/L (ref 98–109)
Creatinine: 0.87 mg/dL (ref 0.60–1.10)
GFR, Est AFR Am: >60 mL/min (ref >60)
GFR, Estimated: >60 mL/min (ref >60)
Glucose, Bld: 87 mg/dL (ref 70–140)
Potassium: 3.8 mmol/L (ref 3.5–5.1)
Sodium: 138 mmol/L (ref 136–145)
Total Bilirubin: 0.4 mg/dL (ref 0.2–1.2)
Total Protein: 7.4 g/dL (ref 6.4–8.3)

## 2017-09-16 LAB — LACTATE DEHYDROGENASE: LDH: 251 U/L — ABNORMAL HIGH (ref 125–245)

## 2017-09-16 LAB — DIRECT ANTIGLOBULIN TEST (NOT AT ARMC)
DAT, IgG: NEGATIVE
DAT, complement: NEGATIVE

## 2017-09-16 LAB — VITAMIN B12: Vitamin B-12: 294 pg/mL (ref 180–914)

## 2017-09-16 LAB — IRON AND TIBC
Iron: 30 ug/dL — ABNORMAL LOW (ref 41–142)
Saturation Ratios: 9 % — ABNORMAL LOW (ref 21–57)
TIBC: 349 ug/dL (ref 236–444)
UIBC: 319 ug/dL

## 2017-09-16 LAB — FERRITIN: Ferritin: 27 ng/mL (ref 9–269)

## 2017-09-16 NOTE — Telephone Encounter (Signed)
Scheduled appt per 2/12 los - Gave patient aVS and calender per los.

## 2017-09-17 ENCOUNTER — Other Ambulatory Visit (HOSPITAL_COMMUNITY): Payer: BC Managed Care – PPO | Admitting: Licensed Clinical Social Worker

## 2017-09-17 ENCOUNTER — Other Ambulatory Visit (HOSPITAL_COMMUNITY): Payer: Self-pay

## 2017-09-17 DIAGNOSIS — F3162 Bipolar disorder, current episode mixed, moderate: Secondary | ICD-10-CM

## 2017-09-17 LAB — HAPTOGLOBIN: Haptoglobin: 141 mg/dL (ref 34–200)

## 2017-09-17 NOTE — Progress Notes (Signed)
Spiritual care group 2/13//2019 10:30-12:00 ?Facilitated by by Simone Curia.     ?  Spiritual care group focused on topic of "community," exploring group member's current experience of community, what they value and what they hope for in community. Group engaged in facilitated discussion and then chose pieces of art or pictures that represented their current experience of community and what they long for. Group facilitation drew on Narrative and Adlerian frameworks   [name] was present throughout group.  Alert and engaged with other group members.  Agreed with another group member who noted that their experience of community was increasingly narrow in focus - being only immediate family at this point.  [name] noted that she used to have other elements in her life that brought her community - work, travel, Social research officer, government.  She is hopeful to reintroduce these as time permits.  Noted the difficulty of creating community as an adult and described move back to [place] as helpful, but she has not been able to find new community here due to responsibilities with family as well as feeling isolated and not having energy to engage others.   Describes herself as an introvert and thrives on having close relationships with a few people.  [name] is connected to her best friend in [place] and friend in [facility] through [employer] and finds these relationships helpful, though she wishes these people could be present more often.    [facility] / [place], MDiv

## 2017-09-17 NOTE — Psych (Signed)
   [facility] PHP THERAPIST PROGRESS NOTE  Christine [name] [id number]  Session Time: 9-10:30  Participation Level: Active  Behavioral Response: CasualAlertAnxious  Type of Therapy: Group Therapy; Psychotherapy  Treatment Goals addressed: Coping  Interventions: CBT, DBT, Solution Focused, Supportive and Reframing  Summary: Clinician led check-in regarding current stressors and situation, and review of patient completed daily inventory. Clinician utilized active listening and empathetic response and validated patient emotions. Clinician facilitated processing group on pertinent issues.  Therapist Response:  Patient arrived within time allowed and reports that she is feeling "even, not happy or not sad." Patient rates her mood at an 5 on a scale of 1-10 with 10 being great. Patient reports that she had a good doctor's appointment yesterday concerning her hemoglobin. 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 Patient stated that she is still working on her mental wellness and trying to find things that keep her busy. Patient believes that beginning the baking and pastry arts school will allow her to find mental happiness again. Patient engaged in activity and discussion.      Session Time: 10:30- 12:00  Participation Level: Active  Behavioral Response: CasualAlertAnxious  Type of Therapy: Group Therapy; Psychotherapy, Activity Therapy  Treatment Goals addressed: Coping  Interventions: Supportive; Reframing; Strengths based  Summary: Spiritual Care group  Therapist Response: Patient engaged in activity and discussion. Please see [place] note.       Session Time: 12:00 - 1:00  Participation Level: Active  Behavioral Response: CasualAlertAnxious  Type of Therapy: Group Therapy; Activity Therapy  Treatment Goals addressed: Coping  Interventions: Social Skills training;  Supportive  Summary: Reflection Group: Patients encouraged to practice skills and interpersonal techniques or work on mindfulness and relaxation techniques. The importance of self-care and making skills part of a routine to increase usage were stressed  Therapist Response:  Patient engaged and participated appropriately.       Session Time: 1:00 - 2:00  Participation Level: Active  Behavioral Response: CasualAlertAnxious  Type of Therapy: Group Therapy; Psychotherapy; Psychoeducation  Treatment Goals addressed: Coping  Interventions: Relaxation training; Supportive and Reframing  Summary: 1:00 - 1:50 Relaxation Group: Cln Leanne Yates let a yoga group focused on retaining the body's response to stress 1:50 - 12:00 Clinician led check-out. Clinician assessed for immediate needs, medication compliance and efficacy, and safety concerns.   Therapist Response:  Patient engaged in activity and discussion. At check out, Patient rates her mood at a 5 on a scale of 1-10 with 10 being great. Patient reports that she has no plans for the afternoon or evening and is just going to go with the flow. Patient demonstrates some progress as evidenced by stating that she is going to continue to work on her self-care to improve her mental health. Patient denies SI/HI/self-harm at the end of group.    Suicidal/Homicidal: Nowithout intent/plan  Plan: Patient will continue in PHP and medication management while working on decreasing depression and anxiety symptoms and increasing distress tolerance and emotional regulation skills.  Diagnosis: Bipolar disorder, current episode mixed, moderate ([place]) [F31.62]    1. Bipolar disorder, current episode mixed, moderate ([place])     [name], LCSW 09/17/2017

## 2017-09-18 ENCOUNTER — Other Ambulatory Visit (HOSPITAL_COMMUNITY): Payer: BC Managed Care – PPO | Admitting: Licensed Clinical Social Worker

## 2017-09-18 ENCOUNTER — Other Ambulatory Visit (HOSPITAL_COMMUNITY): Payer: BC Managed Care – PPO | Admitting: Specialist

## 2017-09-18 ENCOUNTER — Ambulatory Visit (HOSPITAL_COMMUNITY): Payer: Self-pay

## 2017-09-18 DIAGNOSIS — F3132 Bipolar disorder, current episode depressed, moderate: Secondary | ICD-10-CM

## 2017-09-18 DIAGNOSIS — F0789 Other personality and behavioral disorders due to known physiological condition: Secondary | ICD-10-CM

## 2017-09-18 DIAGNOSIS — R4589 Other symptoms and signs involving emotional state: Secondary | ICD-10-CM

## 2017-09-18 NOTE — Progress Notes (Signed)
Progress note  Christine [name] says she had the urge to cut last night when she was very emotional but resisted because she does not want to do that again.  Medications were reviewed.  The lithium is the most likely helpful to her but will not quickly relieve the depression which is what she wants most.  She usually has mixed episodes and is not used to just depression alone.  She was hoping the increased lithium would relieve the depression faster than it can. She saw her hematologist yesterday but we do not have the results of the hemaglobin/hematocrit.  At best they are lower than normal and will contribute to the depression as well.  Plan is to continue the lithium CR 600 mg bid, lamotrigine  150 mg bid, clonazepam 0.5 mg bid.  Consider increasing the brexiprazole to 3 mg to help augment the anti depressant effect.

## 2017-09-19 ENCOUNTER — Encounter (HOSPITAL_COMMUNITY): Payer: Self-pay

## 2017-09-19 ENCOUNTER — Other Ambulatory Visit (HOSPITAL_COMMUNITY): Payer: BC Managed Care – PPO | Admitting: Licensed Clinical Social Worker

## 2017-09-19 VITALS — BP 153/86 | HR 100 | Ht 65.0 in | Wt 340.0 lb

## 2017-09-19 DIAGNOSIS — F3132 Bipolar disorder, current episode depressed, moderate: Secondary | ICD-10-CM

## 2017-09-19 LAB — METHYLMALONIC ACID, SERUM: Methylmalonic Acid, Quantitative: 87 nmol/L (ref 0–378)

## 2017-09-19 NOTE — Progress Notes (Signed)
Patient presents with sad affect, depressed mood and reported she woke up more anxious this morning but did not take a Klonopin to help with this.  Patient reported she often gets anxious and wakes up that way but usually this subsides with time as the morning gets going. States periodically, 1-2 times a week she will take a Klonopin if this persists but also reports trying to use distracting skills and encouraged meditative skills to help during increased periods of anxiety.  Patient rated her current level of depression a 7, anxiety an 8, and hopelessness a 6 on a scale of 0-10 with 0 being none and 10 the worst she could manage.  Patient denied any suicidal or homicidal ideations, no auditory or visual hallucinations and reported no problem with medication adjustment and increase in Lithium by Dr. Lovena Le.  Patient reported she did have one night of impulsive thinking earlier this week on Tuesday where she thought about cutting, but used distracting skills by talking with her husband until this thought went away and did not act on thoughts.  Patient reported between medication and really trying to do a better job of using learned coping skills, she is doing okay today and more optimistic about current medications and status.  Patient reported she feels PHP is more helpful this second time taking as states she feels she is doing better about using what she is learning.  Patient scored a 16 on her PHQ9 depression screening today, down from 21 when she started [facility] 09/09/17 and is happy with current medication regimen.  States plan to follow up as Dr. Lovena Le suggested to make sure she gets iron infusions when needed and to increase her Vitamin D level.  Patient returned to group without any complaints today and agreed to inform this nurse or PHP staff if any worsening of symptoms or problems with medications.  Reviewed potential side effects to medications and what to look for regarding Lithium toxicity with recent  increase and patient denies any symptoms of this at present.  Reviewed treatment plan and agree with it and continued PHP services.

## 2017-09-19 NOTE — Therapy (Signed)
[facility] [place], [place], [address] Phone: [phone]   Fax:  [phone]  Occupational Therapy Treatment  Patient Details  Name: Christine [name] MRN: [id number] Date of Birth: 01-20-1976 Referring Provider: Dr. [name]   Encounter Date: 09/18/2017  OT End of Session - 09/19/17 1349    Visit Number  3    Number of Visits  6    Date for OT Re-Evaluation  10/03/17    Authorization Type  BCBS    OT Start Time  1030    OT Stop Time  1130    OT Time Calculation (min)  60 min    Activity Tolerance  Patient tolerated treatment well    Behavior During Therapy  [facility] for tasks assessed/performed       Past Medical History:  Diagnosis Date  . Anemia   . Anxiety   . Bipolar 1 disorder ([place])   . Depression   . Fibroids   . High blood pressure   . Mental disorder   . PCOS (polycystic ovarian syndrome)     Past Surgical History:  Procedure Laterality Date  . DIAGNOSTIC LAPAROSCOPY    . DILATION AND CURETTAGE OF UTERUS    . HYSTEROSCOPY W/D&C  07/22/2011   Procedure: DILATATION AND CURETTAGE /HYSTEROSCOPY;  Surgeon: Cyril Mourning, MD;  Location: [facility];  Service: Gynecology;;    There were no vitals filed for this visit.  Subjective Assessment - 09/19/17 1348    Currently in Pain?  No/denies       S: I need to work on my accountability in work and leisure.   O:  Group opened with members defining accountability.  Leader then explained definition of personal accountability, each individual being responsible for their own actions and holding themselves to this expected standard.  Personal accountability is doing what you know you should do, when you should do it.  Group discussed if accountability was a positive or negative theme for them and why they had this view point.  Group was educated on three type of accountability:  actions and choices, responsibilities, and goals.  Group provided  examples of each accountability and identified the type that they most struggle with.  Leader identified the use of a daily prioritized to do list as a successful tool in improving accountability.  Group members discussed ways to prioritize the list.  Group then explored the theme that one must desire accountability in order to follow through and be successful.  One's values must be considered when making accountability goals.  Each group member spent time reflecting on their work, relationship, Building surveyor, and leisure values.  They used a bull's eye to conceptualilze where they were at "today" in relation to their values.  Using this information, they will then work on creating microgoals that align with their values and will improve their accountability. A:  Patient was engaged in group.  She identified that accountability was equivelent with setting standards for herself.  Christine [name] identified and committed to needing to improve her personal accountability in work and leisure.   P:  Continue skilled OT group 2 times per week for 3 weeks in order to improve coping and psychosocial skilled needed for improved community reintegration.                      OT Education - 09/19/17 1349    Education provided  Yes    Education Details  Educated  on strategies to improve accountability    Person(s) Educated  Patient    Methods  Explanation;Handout    Comprehension  Verbalized understanding       OT Short Term Goals - 09/11/17 2146      OT SHORT TERM GOAL #1   Title  Patient will be educated on strategies to improve psychosocial skills needed to participate fully in all daily, work, and leisure activities.    Time  3    Period  Weeks    Status  On-going      OT SHORT TERM GOAL #2   Title  Patient will be educated on a HEP and independent with implementation of HEP.    Time  3    Period  Weeks    Status  On-going      OT SHORT TERM GOAL #3   Title  Patient will  independently apply psychosocial skills and coping mechanisms to her daily activities in order to function independently.    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 Iron deficiency anemia due to chronic blood loss 09/16/2017  . Menorrhagia with regular cycle   . Symptomatic anemia 08/28/2017  . Acute blood loss anemia 08/28/2017  . Bipolar disorder ([place]) 08/28/2017  . Anxiety 08/28/2017  . Depression 08/28/2017  . Fibroids 08/28/2017  . PCOS (polycystic ovarian syndrome) 08/28/2017  . Fever 08/28/2017  . HTN (hypertension) 08/28/2017    Vangie Bicker, [place], OTR/L [phone]  09/19/2017, 1:50 PM  [facility] [place], [place], [address] Phone: [phone]   Fax:  [phone]  Name: Christine [name] MRN: [id number] Date of Birth: March 07, 1976

## 2017-09-19 NOTE — Psych (Signed)
[facility] PHP THERAPIST PROGRESS NOTE  Christine [name] [id number]  Session Time: 9-10:30  Participation Level: Active  Behavioral Response: CasualAlertAnxious  Type of Therapy: Group Therapy; Psychotherapy  Treatment Goals addressed: Coping  Interventions: CBT, DBT, Solution Focused, Supportive and Reframing  Summary: Clinician led check-in regarding current stressors and situation, and review of patient completed daily inventory. Clinician utilized active listening and empathetic response and validated patient emotions. Clinician facilitated processing group on pertinent issues.  Therapist Response:  Patient arrived within time allowed and reports that she is feeling "depressed." Patient rates her mood at a 2 on a scale of 1-10 with 10 being great. Patient reports that she was very irritated and frustrated last night and took it out on her kids and husband. Patient reported that she felt bad about it but think it may be because she is tired. Patient stated that she cried on the way to group. Patient picked her prescriptions and felt happy after the pharmacist remembered her from the last time she was there. Patient reports that she is still needs to work on how to deal with her mood swings and accepting the emotions that come. Patient engaged in discussion.      Session Time: 10:30- 11:30  Participation Level: Active  Behavioral Response: CasualAlertAnxious  Type of Therapy: Group Therapy;  Activity Therapy  Treatment Goals addressed: Coping  Interventions: Psychosocial skill training  Summary: Occupational Therapy  Therapist Response: Patient engaged in activity and discussion. Please see OT note.      Session Time: 11:30- 12:15  Participation Level: Active  Behavioral Response: CasualAlertAnxious  Type of Therapy: Group Therapy; Psychotherapy, Activity Therapy  Treatment Goals addressed: Coping  Interventions: CBT, DBT, Solution focused; Supportive and  Reframing  Summary: Clinician led a discussion on low self-esteem, healthy self-esteem, and elevated self-esteem  Therapist Response: Patient engaged in discussion and stated that identifies self-esteem as having a good self-image and being confident in self. Patient shared that those with negative self-esteem often have negative thoughts about self.       Session Time: 12:15 - 1:00  Participation Level: Active  Behavioral Response: CasualAlertAnxious  Type of Therapy: Group Therapy; Activity Therapy  Treatment Goals addressed: Coping  Interventions: Social Skills training; Supportive  Summary: Reflection Group: Patients encouraged to practice skills and interpersonal techniques or work on mindfulness and relaxation techniques. The importance of self-care and making skills part of a routine to increase usage were stressed  Therapist Response:  Patient engaged and participated appropriately.      Session Time: 1:00 - 2:00  Participation Level: Active  Behavioral Response: CasualAlertAnxious  Type of Therapy: Group Therapy; Psychotherapy; Psychoeducation  Treatment Goals addressed: Coping  Interventions: CBT, DBT, Solution focused; Supportive and Reframing  Summary: 1:00 - 1:50 Clinician continued discussion and activity around self-esteem. 1:50 - 12:00 Clinician led check-out. Clinician assessed for immediate needs, medication compliance and efficacy, and safety concerns.   Therapist Response:  Patient engaged activity and discussion. Patient was able to identify that she is likes the freckles that she has on her face and is unique in having a mental disorder.  Patient struggles in identifying reported that she is going to work on identifying things that she is good at and add them to the list.  At check out, Patient engaged in activity and discussion. Patient rates her mood at a 6 on a scale of 1-10 with 10 being great at the end of group. 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 Patient also shared that she is going to go get donuts for her daughters for Valentine's Day. Patient demonstrates some progress as evidenced by stating that she is going to continue to work on accepting the feeling she has and work on identifying things that she is good at. Patient denies SI/HI/self-harm at the end of group.       Suicidal/Homicidal: Nowithout intent/plan  Plan: Patient will continue in PHP and medication management while working on decreasing depression and anxiety symptoms and increasing distress tolerance and emotional regulation skills.  Diagnosis: Bipolar 1 disorder, depressed, moderate ([place]) [F31.32]    1. Bipolar 1 disorder, depressed, moderate ([place])     [name], LCSW 09/19/2017

## 2017-09-22 ENCOUNTER — Other Ambulatory Visit (HOSPITAL_COMMUNITY): Payer: BC Managed Care – PPO | Admitting: Licensed Clinical Social Worker

## 2017-09-22 DIAGNOSIS — F3132 Bipolar disorder, current episode depressed, moderate: Secondary | ICD-10-CM

## 2017-09-22 NOTE — Psych (Signed)
   [facility] BH PHP THERAPIST PROGRESS NOTE  Christine [name] [id number]  Session Time: 9-10:00  Participation Level: Active  Behavioral Response: CasualAlertAnxious  Type of Therapy: Group Therapy; Psychotherapy  Treatment Goals addressed: Coping  Interventions: CBT, DBT, Solution Focused, Supportive and Reframing  Summary: Clinician led check-in regarding current stressors and situation, and review of patient completed daily inventory. Clinician utilized active listening and empathetic response and validated patient emotions. Clinician facilitated processing group on pertinent issues.  Therapist Response:  Patient arrived within time allowed and reports that "I woke up anxious and depressed and I do not know why." Patient rates her mood at a 5 on a scale of 1-10 with 10 being great. Patient reports that when she felt anxious and depressed this morning, she did some breathing techniques and distracted herself by making muffins for her family. Patient also shared that she made cupcakes for her daughters for Valentine's Day. Patient reports that she is still needs to work on how to manage her mood swings when they arise. Patient engaged in activity and discussion.    Session Time: 10:00- 11:00  Participation Level: Active  Behavioral Response: CasualAlertAnxious  Type of Therapy: Group Therapy; Psychotherapy, Psychoeducation  Treatment Goals addressed: Coping  Interventions: CBT, DBT, Solution Focused, Supportive and Reframing  Summary: Clinician introduced topic of "Distress Tolerance". Group discussed self-soothe and how/when patients can employ this method to help. Patients identified when this technique may be helpful in their personal lives.  Therapist Response: Patient engaged in activity and discussion. Patient identified sight and smell as the most helpful senses for her to use for self-soothe.    Session Time: 11:00- 12:00  Participation Level: Active  Behavioral  Response: CasualAlertAnxious  Type of Therapy: Group Therapy; Activity Therapy  Treatment Goals addressed: Coping  Interventions: Relaxation Training  Summary: Aromatherapy  Therapist Response: Patient engaged in discussion and activity.     Session Time: 12:00 - 1:00  Participation Level: Active  Behavioral Response: CasualAlertAnxious  Type of Therapy: Group Therapy; Psychotherapy; Psychoeducation  Treatment Goals addressed: Coping  Interventions: CBT, DBT, Solution focused; Supportive and Reframing  Summary: 1:00 - 1:50 Clinician led a self-soothe workshop where patients identified and practiced specific activities to use to self-soothe.  1:50 - 12:00 Clinician led check-out. Clinician assessed for immediate needs, medication compliance and efficacy, and safety concerns.   Therapist Response:  Patient engaged and activity and discussion. Patient shared that she likes to people watch and watch sunsets to help sooth her. Patient stated that she likes the smell of cinnamon and boils a pot of water with cinnamon in it to smell her house of cinnamon to help calm her down.   At [place], patient rates her mood at a 7 at the end of group. Patient has plans of getting her daughters from school after group and relaxing. 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 Patient demonstrates some progress as evidenced by stating that she is going to bake this weekend if she starts to feel overwhelmed. Patient denies SI/HI/self-harm at the end of group.   Suicidal/Homicidal: Nowithout intent/plan  Plan: Patient will continue in PHP and medication management while working on decreasing depression and anxiety symptoms and increasing distress tolerance and emotional regulation skills.  Diagnosis: Bipolar 1 disorder, depressed, moderate ([place]) [F31.32]    1. Bipolar 1 disorder, depressed, moderate  ([place])     Valton Schwartz J Saidah Kempton, LPCA 09/22/2017

## 2017-09-23 ENCOUNTER — Ambulatory Visit (HOSPITAL_COMMUNITY): Payer: Self-pay | Admitting: Licensed Clinical Social Worker

## 2017-09-23 ENCOUNTER — Other Ambulatory Visit (HOSPITAL_COMMUNITY): Payer: BC Managed Care – PPO | Admitting: Occupational Therapy

## 2017-09-23 ENCOUNTER — Encounter (HOSPITAL_COMMUNITY): Payer: Self-pay

## 2017-09-23 ENCOUNTER — Ambulatory Visit (HOSPITAL_COMMUNITY): Payer: Self-pay | Admitting: Psychiatry

## 2017-09-23 ENCOUNTER — Other Ambulatory Visit (HOSPITAL_COMMUNITY): Payer: BC Managed Care – PPO | Admitting: Licensed Clinical Social Worker

## 2017-09-23 DIAGNOSIS — R4589 Other symptoms and signs involving emotional state: Secondary | ICD-10-CM

## 2017-09-23 DIAGNOSIS — F0789 Other personality and behavioral disorders due to known physiological condition: Secondary | ICD-10-CM

## 2017-09-23 DIAGNOSIS — F3132 Bipolar disorder, current episode depressed, moderate: Secondary | ICD-10-CM

## 2017-09-23 NOTE — Therapy (Signed)
[facility] [place], [place], [address] Phone: [phone]   Fax:  [phone]  Occupational Therapy Treatment  Patient Details  Name: Skyleen Bentley MRN: [id number] Date of Birth: 29-Aug-1975 Referring Provider: Dr. [name]   Encounter Date: 09/23/2017  OT End of Session - 09/23/17 1417    Visit Number  4    Number of Visits  6    Date for OT Re-Evaluation  10/03/17    Authorization Type  [employer]    OT Start Time  1030    OT Stop Time  1130    OT Time Calculation (min)  60 min    Activity Tolerance  Patient tolerated treatment well    Behavior During Therapy  [facility] for tasks assessed/performed       Past Medical History:  Diagnosis Date  . Anemia   . Anxiety   . Bipolar 1 disorder ([place])   . Depression   . Fibroids   . High blood pressure   . Mental disorder   . PCOS (polycystic ovarian syndrome)     Past Surgical History:  Procedure Laterality Date  . DIAGNOSTIC LAPAROSCOPY    . DILATION AND CURETTAGE OF UTERUS    . HYSTEROSCOPY W/D&C  07/22/2011   Procedure: DILATATION AND CURETTAGE /HYSTEROSCOPY;  Surgeon: Cyril Mourning, MD;  Location: [facility];  Service: Gynecology;;    There were no vitals filed for this visit.  Subjective Assessment - 09/23/17 1417    Currently in Pain?  No/denies         [facility] OT Assessment - 09/23/17 1416      Assessment   Medical Diagnosis  Bipolar I      Precautions   Precautions  None         OT Group Session: Physical and Mental Health and Wellness:    S: "Your depression could become worse if something should happen to you physically."   O: Physical and mental health and wellness session completed with emphasis on connecting the two areas, effects of self-esteem and confidence building, how to improve physical and mental health, and goal setting. Patient provided with education on building positive physical and mental health practices to  improve physical, emotional, and social well-being including exercise habits, nutrition, sleep, hand hygiene, and stress reduction.     A: Pt participated in physical and mental health and wellness occupational therapy treatment session this date within the [facility] program.  Pt participated in group activity of "Telestrations" game focusing on illustrating various words or concepts connected to physical and mental health and wellness. Pt participated in discussion and was receptive to education on the links between physical health and mental health, mind and body connections, and identifying ways to improve upon current health habits affecting mental and physical health and wellness. Pt identified various strategies to combat depression and ways to improve current lifestyle habits. Strategies provided to assist in improving physical and mental health balance including exercise, nutrition, stress management, sleep habits, and recognizing when changes are necessary.    P: Continue participation in skilled occupational therapy groups 1-2 times per week for 2 weeks in order to gain the necessary skills needed to return to full time community living and learn effective coping strategies to be a productive community resident.                  OT Short Term Goals - 09/11/17 2146      OT SHORT TERM  GOAL #1   Title  Patient will be educated on strategies to improve psychosocial skills needed to participate fully in all daily, work, and leisure activities.    Time  3    Period  Weeks    Status  On-going      OT SHORT TERM GOAL #2   Title  Patient will be educated on a HEP and independent with implementation of HEP.    Time  3    Period  Weeks    Status  On-going      OT SHORT TERM GOAL #3   Title  Patient will independently apply psychosocial skills and coping mechanisms to her daily activities in order to function independently.    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 Iron deficiency anemia due to chronic blood loss 09/16/2017  . Menorrhagia with regular cycle   . Symptomatic anemia 08/28/2017  . Acute blood loss anemia 08/28/2017  . Bipolar disorder ([place]) 08/28/2017  . Anxiety 08/28/2017  . Depression 08/28/2017  . Fibroids 08/28/2017  . PCOS (polycystic ovarian syndrome) 08/28/2017  . Fever 08/28/2017  . HTN (hypertension) 08/28/2017   Guadelupe Sabin, OTR/L  [phone] 09/23/2017, 2:17 PM  [facility] PARTIAL HOSPITALIZATION PROGRAM [place], [place], [address] Phone: [phone]   Fax:  [phone]  Name: Chalon Zobrist MRN: [id number] Date of Birth: 03-17-1976

## 2017-09-23 NOTE — Psych (Signed)
[facility] BH PHP THERAPIST PROGRESS NOTE  Despina Boan [id number]  Session Time: 9-10:30  Participation Level: Active  Behavioral Response: CasualAlertDepressed  Type of Therapy: Group Therapy; Psychotherapy  Treatment Goals addressed: Coping  Interventions: CBT, DBT, Solution Focused, Supportive and Reframing  Summary: Clinician led check-in regarding current stressors and situation, and review of patient completed daily inventory. Clinician utilized active listening and empathetic response and validated patient emotions. Clinician facilitated processing group on pertinent issues.  Therapist Response:  Patient arrived within time allowed and reports that she is feeling "a little more balanced." Patient rates her mood at a 6.5 on a scale of 1-10 with 10 being great. Patient reports she had an appointment yesterday with the gynecologist and is stressed about the outcome and having to figure out childcare for an upcoming surgery. Pt reports her children are becoming sick and that is also stressful, however pt states she feels she is handling the stress "a little better." Patient reports continuing to struggle with recognizing progress and feeling like she is not a failure. Patient engaged in discussion.      Session Time: 10:30- 11:30  Participation Level: Active  Behavioral Response: CasualAlertAnxious  Type of Therapy: Group Therapy;  Activity Therapy  Treatment Goals addressed: Coping  Interventions: Psychosocial skill training  Summary: Occupational Therapy  Therapist Response: Patient engaged in activity and discussion. Please see OT note.      Session Time: 11:30- 12:15  Participation Level: Active  Behavioral Response: CasualAlertDepressed  Type of Therapy: Group Therapy; Psychoeducation  Treatment Goals addressed: Coping  Interventions: CBT, DBT, Solution focused; Supportive and Reframing  Summary: Clinician introduced topic of cognitive distortions.    Therapist Response: Patient engaged in discussion and provided examples of cognitive distortions as they were discussed.       Session Time: 12:15 - 1:00  Participation Level: Active  Behavioral Response: CasualAlertDepressed  Type of Therapy: Group Therapy; Activity Therapy  Treatment Goals addressed: Coping  Interventions: Social Skills training; Supportive  Summary: Reflection Group: Patients encouraged to practice skills and interpersonal techniques or work on mindfulness and relaxation techniques. The importance of self-care and making skills part of a routine to increase usage were stressed  Therapist Response:  Patient engaged and participated appropriately.      Session Time: 1:00 - 2:00  Participation Level: Active  Behavioral Response: CasualAlertAnxious  Type of Therapy: Group Therapy; Psychotherapy; Psychoeducation  Treatment Goals addressed: Coping  Interventions: CBT, DBT, Solution focused; Supportive and Reframing  Summary: 1:00 - 1:50 Clinician continued topic of "Cognitive Distortions" from earlier. Pts continued to identify common cognitive distortions they often experience and ways to combat those distortions. 1:50 - 12:00 Clinician led check-out. Clinician assessed for immediate needs, medication compliance and efficacy, and safety concerns.   Therapist Response:  Patient engaged activity and discussion. Patient identified "should" statements and emotional reasoning as cognitive distortions which are most problematic for her. Pt is able to connect that judgments of herself and feeling like her emotions control her are how this is shown on a day to day basis.  At check out, patient rates her mood at a 7 on a scale of 1-10 with 10 being great at the end of group. Patient reports she plans to run some errands and spend time with her daughters. Patient demonstrates some progress as evidenced by awareness regarding her cognitive distortions and coming  up with a plan to increase self care. Patient denies SI/HI/self-harm at the end of group.  Suicidal/Homicidal: Nowithout intent/plan  Plan: Patient will continue in PHP and medication management while working on decreasing depression and anxiety symptoms and increasing distress tolerance and emotional regulation skills.  Diagnosis: Bipolar 1 disorder, depressed, moderate ([place]) [F31.32]    1. Bipolar 1 disorder, depressed, moderate ([place])     [name], LCSW 09/23/2017

## 2017-09-23 NOTE — Psych (Signed)
[facility] BH PHP THERAPIST PROGRESS NOTE  Altheria Shadoan [id number]  Session Time: 9-10:30  Participation Level: Active  Behavioral Response: CasualAlertAnxious  Type of Therapy: Group Therapy; Psychotherapy  Treatment Goals addressed: Coping  Interventions: CBT, DBT, Solution Focused, Supportive and Reframing  Summary: Clinician led check-in regarding current stressors and situation, and review of patient completed daily inventory. Clinician utilized active listening and empathetic response and validated patient emotions. Clinician facilitated processing group on pertinent issues.  Therapist Response:  Patient arrived within time allowed and reports that she is feeling "level." Patient rates her mood at a 5 on a scale of 1-10 with 10 being great. Patient reports that she had a good weekend and is worried about "the other shoe dropping." Pt reports she was able to spend time with her daughters doing stuff she enjoys like cooking. Pt reports she is thinking about new ideas of starting businesses with her husband. Pt reports she slept for about 5.5 hours in longer spurts than usual and has used classical music to help her fall and stay asleep longer.  Patient reports that she is still needs to work on how to deal with her mood swings and accepting the emotions that come. Patient engaged in discussion.    Session Time: 10:30- 12:00   Participation Level: Active   Behavioral Response: CasualAlertAnxious   Type of Therapy: Group Therapy, Psychoeducation, Psychotherapy   Treatment Goals addressed: Coping   Interventions: CBT, DBT, Solution Focused, Strength-based, Supportive, and Reframing   Summary:  Clinician led psychoeducation group on distress tolerance. The ACCEPTS skill was reviewed and finished, and patients discussed how to utilize it. Patients identified when this technique may be helpful in their personal lives.   Therapist Response: Patient engaged in activity and  discussion.  Pt identified using different emotions and activities as methods she can employ to manage symptoms and emotions.   Session Time: 12:00 - 12:45  Participation Level: Active  Behavioral Response: CasualAlertAnxious  Type of Therapy: Group Therapy; Activity Therapy  Treatment Goals addressed: Coping  Interventions: Social Skills training; Supportive  Summary: Reflection Group: Patients encouraged to practice skills and interpersonal techniques or work on mindfulness and relaxation techniques. The importance of self-care and making skills part of a routine to increase usage were stressed  Therapist Response:  Patient engaged and participated appropriately.      Session Time: 12:45-1:45   Participation Level: Active   Behavioral Response: CasualAlertAnxious   Type of Therapy: Group Therapy, Psychotherapy, Psychoeducation   Treatment Goals addressed: Coping   Interventions: CBT, DBT, Solution Focused, Strength-based, Supportive, and Reframing   Summary:  12:45 - 1:30 Clinician introduced topic of "Positive Psychology". Group watched "Positive Psychology" Ted-Talk. Patients discussed how their "lens" of life effects the way they feel. Patients identified two strategies they would be willing to try to change their "lens".  1:30 - 1:45 Clinician led check-out. Clinician assessed for immediate needs, medication compliance and efficacy, and safety concerns.     Therapist Response:  Patient engaged activity and discussion. Pt identified journaling on a positive event each day to help change her lens. At check out, Patient engaged in activity and discussion. Patient rates her mood at a 6 on a scale of 1-10 with 10 being great at the end of group. Patient reports she has a doctors appt in the afternoon that she will attend. Pt reports no other plans. Patient demonstrates some progress as evidenced by continuing to stay focused on her need for balance. Patient denies  SI/HI/self-harm at the end of group.    Suicidal/Homicidal: Nowithout intent/plan  Plan: Patient will continue in PHP and medication management while working on decreasing depression and anxiety symptoms and increasing distress tolerance and emotional regulation skills.  Diagnosis: Bipolar 1 disorder, depressed, moderate ([place]) [F31.32]    1. Bipolar 1 disorder, depressed, moderate ([place])     Rayvon Dakin J Daquawn Seelman, LPCA 09/23/2017

## 2017-09-24 ENCOUNTER — Other Ambulatory Visit (HOSPITAL_COMMUNITY): Payer: Self-pay

## 2017-09-24 ENCOUNTER — Ambulatory Visit: Payer: Self-pay | Admitting: Medical

## 2017-09-24 ENCOUNTER — Inpatient Hospital Stay: Payer: BC Managed Care – PPO

## 2017-09-24 ENCOUNTER — Telehealth (HOSPITAL_COMMUNITY): Payer: Self-pay | Admitting: Professional

## 2017-09-24 VITALS — BP 132/79 | HR 94 | Temp 98.7°F | Resp 18

## 2017-09-24 DIAGNOSIS — D500 Iron deficiency anemia secondary to blood loss (chronic): Secondary | ICD-10-CM | POA: Diagnosis not present

## 2017-09-24 MED ORDER — SODIUM CHLORIDE 0.9 % IV SOLN
510.0000 mg | Freq: Once | INTRAVENOUS | Status: AC
  Administered 2017-09-24: 510 mg via INTRAVENOUS
  Filled 2017-09-24: qty 17

## 2017-09-24 MED ORDER — SODIUM CHLORIDE 0.9 % IV SOLN
Freq: Once | INTRAVENOUS | Status: AC
  Administered 2017-09-24: 08:00:00 via INTRAVENOUS

## 2017-09-24 MED ORDER — FAMOTIDINE IN NACL 20-0.9 MG/50ML-% IV SOLN
20.0000 mg | Freq: Once | INTRAVENOUS | Status: AC
  Administered 2017-09-24: 20 mg via INTRAVENOUS
  Filled 2017-09-24: qty 50

## 2017-09-24 NOTE — Patient Instructions (Signed)

## 2017-09-24 NOTE — Progress Notes (Signed)
At completion of IV Feraheme infusion, pt began c/o of feeling "warm and tingly" and having a headache. VS taken and [facility] notified. VS stable and received an order for IV Pepcid. Will continue to monitor  Pt waited another 15 minutes and stated that she "felt much better and was ready to leave". Discharge instructions given and pt instructed to call back if any other symptoms arise. Pt verbalized understanding.

## 2017-09-24 NOTE — Progress Notes (Signed)
[facility]pt[facility] Progress Note   Christine [name] [id number] Nov 01, 1975 42 y.o.  Christine [name] is managed by Dr. Patsi Sears presents for: Feraheme infusion  Louis Meckel had completed her infusion of Feraheme at the time of her reaction.  First dose of Feraheme: no  Assessment: Plan:    Infusion reaction, initial encounter  Jahdai Padovano was seen in the infusion room for a suspected infusion reaction. She had completed her infusion of Feraheme at the time of her reaction. Her symptoms included: Feeling warm with tingling of the body. She was not premedicated prior to starting her infusion. Zierra Laroque was given Pepcid 20 mg IV after onset of her symptoms. [name] Hehn did  respond to intervention.   Please see After Visit Summary for patient specific instructions.  Future Appointments  Date Time Provider [place]  09/25/2017  8:00 AM [facility] OT GROUP THERAPY [facility] None  09/25/2017  9:00 AM Jenkins Rouge, LCAS [facility] None  09/26/2017  9:00 AM [facility] PHP CLINIC [facility] None  09/29/2017  9:00 AM [facility] PHP CLINIC [facility] None  09/30/2017  8:00 AM [facility] OT GROUP THERAPY [facility] None  09/30/2017  9:00 AM [facility] PHP CLINIC [facility] None  10/01/2017  9:00 AM [facility] PHP CLINIC [facility] None  10/02/2017  8:00 AM [facility] OT GROUP THERAPY [facility] None  10/02/2017  9:00 AM [facility] PHP CLINIC [facility] None  10/03/2017  9:00 AM [facility] PHP CLINIC [facility] None  10/06/2017  9:00 AM [facility] PHP CLINIC [facility] None  10/07/2017  8:00 AM [facility] OT GROUP THERAPY [facility] None  10/07/2017  9:00 AM [facility] PHP CLINIC [facility] None  10/08/2017  9:00 AM [facility] PHP CLINIC [facility] None  10/09/2017  8:00 AM [facility] OT GROUP THERAPY [facility] None  10/09/2017  9:00 AM Mackenzie, Lisbeth S, LCAS [facility] None  10/10/2017  9:00 AM [facility] PHP CLINIC [facility] None  10/13/2017  7:45 AM [facility] LAB 6 [facility] None  10/15/2017  8:20 AM Lebron Conners, Marinell Blight, MD  [facility] None  10/15/2017  9:30 AM [facility] E16 [facility] None  10/20/2017  8:30 AM [name], [name], MD [facility] None  10/23/2017  9:00 AM Alver Fisher S, LCAS [facility] None    No orders of the defined types were placed in this encounter.      Subjective:   Patient ID:  Christine [name] is a 42 y.o. (DOB [birth year]) female.  Chief Complaint: No chief complaint on file.   HPI Christine [name] was seen in the infusion room for a suspected infusion reaction. She had completed her infusion of Feraheme at the time of her reaction. Her symptoms included: Feeling warm with tingling of the body. She was not premedicated prior to starting her infusion. Jenessa Gillingham was given Pepcid 20 mg IV after onset of her symptoms. [name] Brister did  respond to intervention.   Medications: I have reviewed the patient's current medications.  Allergies:  Allergies  Allergen Reactions  . Penicillins Rash    Has patient had a PCN reaction causing immediate rash, facial/tongue/throat swelling, SOB or lightheadedness with hypotension: Yes Has patient had a PCN reaction causing severe rash involving mucus membranes or skin necrosis: Unknown Has patient had a PCN reaction that required hospitalization: No Has patient had a PCN reaction occurring within the last 10 years: No If all of the above answers are "NO", then may proceed with Cephalosporin use.     Past Medical History:  Diagnosis Date  . Anemia   . Anxiety   .  Bipolar 1 disorder ([place])   . Depression   . Fibroids   . High blood pressure   . Mental disorder   . PCOS (polycystic ovarian syndrome)     Past Surgical History:  Procedure Laterality Date  . DIAGNOSTIC LAPAROSCOPY    . DILATION AND CURETTAGE OF UTERUS    . HYSTEROSCOPY W/D&C  07/22/2011   Procedure: DILATATION AND CURETTAGE /HYSTEROSCOPY;  Surgeon: Cyril Mourning, MD;  Location: [facility];  Service: Gynecology;;    Family History  Problem Relation Age of Onset    . Anxiety disorder Mother   . Depression Mother     Social History   Socioeconomic History  . Marital status: Married    Spouse name: Not on file  . Number of children: Not on file  . Years of education: Not on file  . Highest education level: Not on file  Social Needs  . Financial resource strain: Somewhat hard  . Food insecurity - worry: Sometimes true  . Food insecurity - inability: Sometimes true  . Transportation needs - medical: No  . Transportation needs - non-medical: No  Occupational History  . Not on file  Tobacco Use  . Smoking status: Never Smoker  . Smokeless tobacco: Never Used  Substance and Sexual Activity  . Alcohol use: No    Comment: rare  . Drug use: No  . Sexual activity: Yes    Partners: Male    Birth control/protection: None  Other Topics Concern  . Not on file  Social History Narrative  . Not on file    Past Medical History, Surgical history, Social history, and Family history were reviewed and updated as appropriate.   Please see review of systems for further details on the patient's review from today.   Review of Systems:  Review of Systems  Constitutional: Negative for chills, diaphoresis and fever.  HENT: Negative for trouble swallowing.   Respiratory: Negative for cough, chest tightness, shortness of breath and wheezing.   Cardiovascular: Negative for chest pain and palpitations.  Gastrointestinal: Negative for nausea and vomiting.  Skin:       Sensation of warmth and tingling throughout the body.  Neurological: Negative for headaches.    Objective:   Physical Exam:  There were no vitals taken for this visit.  Physical Exam  Constitutional: No distress.  HENT:  Head: Normocephalic and atraumatic.  Cardiovascular: Normal rate, regular rhythm and normal heart sounds. Exam reveals no gallop and no friction rub.  No murmur heard. Pulmonary/Chest: Breath sounds normal. No respiratory distress. She has no wheezes. She has no  rales.  Neurological: She is alert.  Skin: Skin is warm and dry. No rash noted. She is not diaphoretic. No erythema.    Lab Review:     Component Value Date/Time   NA 138 09/16/2017 1314   K 3.8 09/16/2017 1314   CL 108 09/16/2017 1314   CO2 21 (L) 09/16/2017 1314   GLUCOSE 87 09/16/2017 1314   BUN 8 09/16/2017 1314   CREATININE 0.87 09/16/2017 1314   CALCIUM 9.4 09/16/2017 1314   PROT 7.4 09/16/2017 1314   ALBUMIN 4.0 09/16/2017 1314   AST 10 09/16/2017 1314   ALT 12 09/16/2017 1314   ALKPHOS 68 09/16/2017 1314   BILITOT 0.4 09/16/2017 1314   GFRNONAA >60 09/16/2017 1314   GFRAA >60 09/16/2017 1314       Component Value Date/Time   WBC 10.4 (H) 09/16/2017 1314   WBC 7.7 08/29/2017 0256  RBC 4.52 09/16/2017 1314   HGB 9.0 (L) 08/29/2017 1713   HCT 33.6 (L) 09/16/2017 1314   PLT 341 09/16/2017 1314   MCV 74.3 (L) 09/16/2017 1314   MCH 23.2 (L) 09/16/2017 1314   MCHC 31.1 (L) 09/16/2017 1314   RDW 32.6 (H) 09/16/2017 1314   LYMPHSABS 1.7 09/16/2017 1314   MONOABS 0.6 09/16/2017 1314   EOSABS 0.1 09/16/2017 1314   BASOSABS 0.0 09/16/2017 1314   -------------------------------  Imaging from last 24 hours (if applicable):  Radiology interpretation: Dg Chest 2 View  Result Date: 08/28/2017 CLINICAL DATA:  Shortness of breath and fatigue. EXAM: CHEST  2 VIEW COMPARISON:  08/10/2012 FINDINGS: The heart size and mediastinal contours are within normal limits. Both lungs are clear. The visualized skeletal structures are unremarkable. IMPRESSION: No active cardiopulmonary disease. Electronically Signed   By: Markus Daft M.D.   On: 08/28/2017 13:33

## 2017-09-25 ENCOUNTER — Other Ambulatory Visit (HOSPITAL_COMMUNITY): Payer: BC Managed Care – PPO | Admitting: Licensed Clinical Social Worker

## 2017-09-25 ENCOUNTER — Encounter (HOSPITAL_COMMUNITY): Payer: Self-pay

## 2017-09-25 ENCOUNTER — Ambulatory Visit (HOSPITAL_COMMUNITY): Payer: BC Managed Care – PPO | Admitting: Licensed Clinical Social Worker

## 2017-09-25 ENCOUNTER — Other Ambulatory Visit (HOSPITAL_COMMUNITY): Payer: BC Managed Care – PPO | Admitting: Specialist

## 2017-09-25 DIAGNOSIS — F3132 Bipolar disorder, current episode depressed, moderate: Secondary | ICD-10-CM

## 2017-09-25 DIAGNOSIS — R4589 Other symptoms and signs involving emotional state: Secondary | ICD-10-CM

## 2017-09-25 DIAGNOSIS — F0789 Other personality and behavioral disorders due to known physiological condition: Secondary | ICD-10-CM

## 2017-09-25 NOTE — Psych (Signed)
  [facility] Partial Hospitalization Program Psych Discharge Summary  Christine [name] [id number]  Admission date: 09/09/2017 Discharge date: 09/26/2017  Reason for admission: bipolar depression  Progress in Program Toward Treatment Goals: did well.  Continues to learn different ways to frame her feelings toward herself as she deals with the bipolar disorder.  Tends to judge herself harshly as she is not able to work and be productive.  She is so good at what she does in other areas but she does not see these traits as valuable in their own right.  Depression has lessened and some positive affect is returning but not to the point of hypomania  Progress (rationale): continues to learn coping skills to deal with how to understand, accept and deal with the bipolar disorder  Discharge Plan: will step down to IOP    [name], MD 09/25/2017

## 2017-09-25 NOTE — Psych (Signed)
[facility] BH PHP THERAPIST PROGRESS NOTE  Christine [name] [id number]  Session Time: 9-10:15  Participation Level: Active  Behavioral Response: CasualAlertDepressed  Type of Therapy: Group Therapy; Psychotherapy  Treatment Goals addressed: Coping  Interventions: CBT, DBT, Solution Focused, Supportive and Reframing  Summary: Clinician led check-in regarding current stressors and situation, and review of patient completed daily inventory. Clinician utilized active listening and empathetic response and validated patient emotions. Clinician facilitated processing group on pertinent issues.  Therapist Response:  Patient arrived within time allowed and reports that she is feeling "okay, decent." Patient rates her mood at a 7 on a scale of 1-10 with 10 being great. Patient reports that she had a good evening besides having a bad reaction to her iron infusion. 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 Patient reports that she is still working on balancing task in her life, control, and learning to accept offers from her husband. Patient engaged in discussion.     Session Time: 10:15- 11:15  Participation Level: Active  Behavioral Response: CasualAlertDepressed  Type of Therapy: Group Therapy; Psychoeducation  Treatment Goals addressed: Coping  Interventions: CBT, DBT, Solution focused; Supportive and Reframing  Summary: Clinician lead a group discussing the characteristics of healthy vs. Unhealthy relationships. Patients participated in an Unhealthy vs. Healthy Relationship Game to identify these characteristics. Group discussed healthy vs. Unhealthy relationships in personal life.    Therapist Response: Patient participated in activity and discussion. Patient was able to identify the difference between healthy and unhealthy relationship characteristics with clinician. Patient was able to give clear examples of why an unhealthy relationship is not  good.      Session Time: 11:15- 12:00  Participation Level: Active  Behavioral Response: CasualAlertAnxious  Type of Therapy: Group Therapy;  Activity Therapy  Treatment Goals addressed: Coping  Interventions: Psychosocial skill training  Summary: Occupational Therapy  Therapist Response: Patient engaged in activity and discussion. Please see OT note.      Session Time: 12:15 - 1:00  Participation Level: Active  Behavioral Response: CasualAlertDepressed  Type of Therapy: Group Therapy; Activity Therapy  Treatment Goals addressed: Coping  Interventions: CBT, DBT, Solution focused; Supportive and Reframing  Summary: 12:00-12:45 Clinicians continued to the discussion on relationships. Group watched a [employer] on "Selecting the Right Relationship" and discussed why relationship are important and can be difficult to be in.  12:45 - 1:00 Clinician led check-out. Clinician assessed for immediate needs, medication compliance and efficacy, and safety concerns   Therapist Response:  12:00 - 12:45 Patient engaged in activity and discussion. Patient identified that she was able to see red flags early on in previous relationships and explained why they were not healthy.  12:45 - 1:00  At check-out, patient rates her mood at an 8 on a scale of 1-10 with 10 being great at the end of group. 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 Patient demonstrates some progress by agreeing to communicate with her husband to help push herself to have "me time" and becoming less controlling of situations. Patient denies SI/HI/self-harm at the end of group.      Suicidal/Homicidal: Nowithout intent/plan  Plan: Patient will continue in PHP and medication management while working on decreasing depression and anxiety symptoms and increasing distress tolerance and emotional regulation  skills.  Diagnosis: Bipolar 1 disorder, depressed, moderate ([place]) [F31.32]    1.  Bipolar 1 disorder, depressed, moderate ([place])     [name], LCSW 09/25/2017

## 2017-09-25 NOTE — Therapy (Signed)
[facility] [place], [place], [address] Phone: [phone]   Fax:  [phone]  Occupational Therapy Treatment  Patient Details  Name: Christine [name] MRN: [id number] Date of Birth: Nov 30, 1975 Referring Provider: Dr. [name]   Encounter Date: 09/25/2017  OT End of Session - 09/25/17 1446    Visit Number  5    Number of Visits  6    Date for OT Re-Evaluation  10/03/17    Authorization Type  BCBS    OT Start Time  1115    OT Stop Time  1200    OT Time Calculation (min)  45 min    Activity Tolerance  Patient tolerated treatment well    Behavior During Therapy  [facility] for tasks assessed/performed       Past Medical History:  Diagnosis Date  . Anemia   . Anxiety   . Bipolar 1 disorder ([place])   . Depression   . Fibroids   . High blood pressure   . Mental disorder   . PCOS (polycystic ovarian syndrome)     Past Surgical History:  Procedure Laterality Date  . DIAGNOSTIC LAPAROSCOPY    . DILATION AND CURETTAGE OF UTERUS    . HYSTEROSCOPY W/D&C  07/22/2011   Procedure: DILATATION AND CURETTAGE /HYSTEROSCOPY;  Surgeon: Cyril Mourning, MD;  Location: [facility];  Service: Gynecology;;    There were no vitals filed for this visit.  Subjective Assessment - 09/25/17 1445    Currently in Pain?  No/denies       O:  Skilled OT group focused on stress management this date.  Group opened with members defining stress and what stress means to them.   Group was educated on importance of identifying symptoms of stress, events are not stressful but rather our interpretations and reactions to event that are stressful.  Group discussed poor coping mechanisms including self medication, suppression, passivity, acting out, blaming/complaining), why it may work short term, as well as possible long term complications.  Group discussed and learned positive coping mechanisms to stress including: relaxation, positive mental  attitude, support, self-care and lifestyle changes, interpersonal strategies, emotional strategies, cognitive strategies, and philosophical strategies.  Group discussed ways to use their 5 senses for stress relief.  Group ended with members identifying and discussing 5 key stress management strategies they plan to utilize.   A:  [name] admits that she tends to over think things, which causes her to be anxious and stressed.  She often is moody when she is stressed.  [name] has committed to trying a daily affirmation spoken aloud, guided imagery, baking, and deep breathing to manage her stress level.  [name] has met all OT goals and is ready for dc from skilled OT intervention. P: DC from skilled OT intervention this date.                     OT Education - 09/25/17 1445    Education provided  Yes    Education Details  educated on stress management techniques     Person(s) Educated  Patient    Methods  Explanation;Handout    Comprehension  Verbalized understanding       OT Short Term Goals - 09/25/17 1447      OT SHORT TERM GOAL #1   Title  Patient will be educated on strategies to improve psychosocial skills needed to participate fully in all daily, work, and leisure activities.    Time  3    Period  Weeks    Status  Achieved      OT SHORT TERM GOAL #2   Title  Patient will be educated on a HEP and independent with implementation of HEP.    Time  3    Period  Weeks    Status  Achieved      OT SHORT TERM GOAL #3   Title  Patient will independently apply psychosocial skills and coping mechanisms to her daily activities in order to function independently.    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 Iron deficiency anemia due to chronic blood loss 09/16/2017  . Menorrhagia with regular cycle   . Symptomatic anemia 08/28/2017  . Acute blood loss anemia 08/28/2017  . Bipolar disorder ([place]) 08/28/2017  . Anxiety 08/28/2017  . Depression 08/28/2017  . Fibroids 08/28/2017  . PCOS (polycystic ovarian syndrome) 08/28/2017  . Fever 08/28/2017  . HTN (hypertension) 08/28/2017    Christine [name], MHA, OTR/L [phone] OCCUPATIONAL THERAPY DISCHARGE SUMMARY  Visits from Start of Care: 5  Current functional level related to goals / functional outcomes: Independent with coping and psychosocial skills.   Remaining deficits: n/a   Education / Equipment: See above.   Plan: Patient agrees to discharge.  Patient goals were met. Patient is being discharged due to meeting the stated rehab goals.  ?????  Christine [name], [place], OTR/L [phone]     09/25/2017, 2:47 PM  [facility] [place], [place], [address] Phone: [phone]   Fax:  [phone]  Name: Christine [name] MRN: [id number] Date of Birth: 1976/03/26

## 2017-09-26 ENCOUNTER — Other Ambulatory Visit (HOSPITAL_COMMUNITY): Payer: BC Managed Care – PPO | Admitting: Licensed Clinical Social Worker

## 2017-09-26 ENCOUNTER — Encounter (HOSPITAL_COMMUNITY): Payer: Self-pay

## 2017-09-26 VITALS — BP 130/78 | HR 100 | Ht 63.5 in | Wt 343.0 lb

## 2017-09-26 DIAGNOSIS — F3132 Bipolar disorder, current episode depressed, moderate: Secondary | ICD-10-CM

## 2017-09-26 NOTE — Progress Notes (Signed)
Patient presents with appropriate affect, still some depressed mood but reports she does feel prepared for step-down to IOP in the coming week and less depression than when she started PHP.  Patient denies any suicidal or homicidal ideations at present.  Admits she has still had fleeting suicidal ideations at times over the past 2 weeks but does use distraction skills to help whenever these thoughts return.  Patient reported she is now taking online courses and gets some feeling of accomplishment from this and when she knits.  Patient rated her current level of depression a 3, anxiety a 7, and hopelessness a 4 on a scale of 0-10 with 0 being none and 10 the worst she could manage today.  States her anxiety was up this morning only because the day started with her kids missing the school bus which disrupted their normal morning routine.  Patient scored a 12 on her PHQ9 depression screening, down form 21 on 09/09/17 and admits things are much improved.  Patient discussed plans to follow up with individual therapy with B[facility] Rail once completes IOP and will discuss with her possible weekly group therapy sessions.  Patient stable today with no problems with medications and denial of any thoughts or plans to harm self or others.  Patient agreed to contact this nurse if any worsening of symptoms with transition to IOP in the coming week.

## 2017-09-29 ENCOUNTER — Other Ambulatory Visit (HOSPITAL_COMMUNITY): Payer: Self-pay

## 2017-09-29 ENCOUNTER — Other Ambulatory Visit (HOSPITAL_COMMUNITY): Payer: BC Managed Care – PPO | Admitting: Psychiatry

## 2017-09-29 DIAGNOSIS — F0789 Other personality and behavioral disorders due to known physiological condition: Secondary | ICD-10-CM

## 2017-09-29 DIAGNOSIS — F3132 Bipolar disorder, current episode depressed, moderate: Secondary | ICD-10-CM

## 2017-09-29 NOTE — Psych (Signed)
[facility] BH PHP THERAPIST PROGRESS NOTE  Christine [name] [id number]  Session Time: 9-10:00  Participation Level: Active  Behavioral Response: CasualAlertAnxious  Type of Therapy: Group Therapy; Psychotherapy  Treatment Goals addressed: Coping  Interventions: CBT, DBT, Solution Focused, Supportive and Reframing  Summary: Clinician led check-in regarding current stressors and situation, and review of patient completed daily inventory. Clinician utilized active listening and empathetic response and validated patient emotions. Clinician facilitated processing group on pertinent issues.  Therapist Response:  Patient arrived within time allowed and reports that "I had a rough morning." Patient rates her mood at a 6 on a scale of 1-10 with 10 being great. Patient reports having a difficult time with the morning routine with her children this morning. 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 Patient reports that she is still needs to work on maintaining balance. Patient engaged in activity and discussion.    Session Time: 10:00- 11:00  Participation Level: Active  Behavioral Response: CasualAlertAnxious  Type of Therapy: Group Therapy; Psychotherapy  Treatment Goals addressed: Coping  Interventions: DBT, CBT, Strengths based, Solution focused, Reframing, Supportive  Summary: Clinician led group on stress. Pt's discussed how stress effects them and what role it plays in their lives.     Therapist Response: Patient engaged in activity and discussion. Patient identified negative thinking as a primary stressor for her and being able to give up control and recognize partial successes.     Session Time: 11:00- 12:00  Participation Level: Active  Behavioral Response: CasualAlertAnxious  Type of Therapy: Group Therapy, Psychotherapy, Psychoeducation  Treatment Goals addressed: Coping  Interventions: Strength Based, Supportive  and Reframing  Summary: Grief Group by community therapist: Pt's discussed how grief can manifest and how it is dealt with. Group reviewed article with things that you should/should not say to those grieving.   Therapist Response: Patient engaged in discussion. Pt reports she struggles to work with "universals" when it comes to grief as it is so individualized.     Session Time: 12:00 - 1:00  Participation Level: Active  Behavioral Response: CasualAlertAnxious  Type of Therapy: Group Therapy, Psychoeducation  Treatment Goals addressed: Coping  Interventions: Solution focused; Supportive; Reframing  Summary: 12:00 - 12:50: Group reviewed stress management techniques and identified which ones may be helpful to them. 12:50 -1:00 Clinician led check-out. Clinician assessed for immediate needs, medication compliance and efficacy, and safety concerns   Therapist Response: Patient engaged activity and discussion. Patient was able to identify that planning ahead and relaxing her standards could help relieve some stress for her.  At [place], patient rates her mood at a 7 at the end of group. Patient has to take a nap today and then do a deep house cleaning this weekend. Pt states trying to get into a routine for her new school work. Patient demonstrates continued progress as evidenced by increased insight and action into struggles. Patient denies SI/HI/self-harm at the end of group.   Suicidal/Homicidal: Nowithout intent/plan  Plan: Patient will discharge from [facility] due to meeting treatment goals of stabilized mood, increased activity, and decreased depression. Progress is noted by pt's self report, cln observation, and scales. Pt will step down to IOP within this agency, beginning on 09/29/17. Pt and psychiatrist are aligned with this plan. Pt denies SI/HI/psychosis at time of discharge.   Diagnosis: Bipolar 1 disorder, depressed, moderate ([place]) [F31.32]    1. Bipolar 1 disorder,  depressed, moderate ([place])     Christine [name]  [place], [place] 09/29/2017

## 2017-09-29 NOTE — H&P (Signed)
Patient name Christine [name], [place] DICTATION#  [id number] CSN# [id number]  Darlyn Chamber, MD 09/29/2017 1:23 PM

## 2017-09-29 NOTE — H&P (Signed)
Christine, [name] NO.:  [id number]  MEDICAL RECORD NO.:  [id number]  LOCATION:  PHP                           FACILITY:  [facility]  PHYSICIAN:  Darlyn Chamber, M.D.   DATE OF BIRTH:  23-Dec-1975  DATE OF ADMISSION:  09/11/2017 DATE OF DISCHARGE:  09/11/2017                             HISTORY & PHYSICAL   HISTORY OF PRESENT ILLNESS:  The patient is a 42 year old, gravida 4, para 0, abortus 4 female, who was being evaluated for heavy cycles.  She was having 6 to 7 days of flow 5 days being heavy.  She has been anemic requiring iron infusions.  She had had a previous hysteroscopic evaluation, resection of polyp in 2013, known history of uterine fibroids with a saline infusion ultrasound.  Did have multiple fibroids, largest measuring 4.9 cm, but she had evidence of an endometrial polyp measuring 2.2 x 1.5.  In view of this, now she presents for hysteroscopic evaluation with resection of the polyp.  ALLERGIES:  She is allergic to penicillin.  MEDICATIONS:  She is on Lamictal, lithium, amlodipine, Meloxicam, Flonase, and Klonopin.  PAST MEDICAL HISTORY:  Significant in that she has a history of hypertension, arthritis, recurrent urinary tract infections, severe anemia, migraine headaches, as well as bipolar disorder.  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 She had D and C for miscarriage in 2005.  She had uterine polyp removed in 2012.  A uterine polyp removed in 2016.  SOCIAL HISTORY:  Reveals no tobacco or alcohol use.  FAMILY HISTORY:  Noncontributory.  REVIEW OF SYSTEMS:  Noncontributory.  PHYSICAL EXAMINATION:  VITAL SIGNS:  The patient is afebrile.  Stable vital signs. HEENT:  The patient is normocephalic.  Pupils are equal, round, and reactive to light and accommodation.  Extraocular movements are intact. Sclerae and conjunctivae clear.  Oropharynx clear. NECK:  Without thyromegaly. LUNGS:   Clear. CARDIOVASCULAR:  Regular rate.  No murmurs or gallops. ABDOMINAL:  Benign.  No masses, organomegaly, or tenderness. PELVIC:  Normal external genitalia.  Vaginal mucosa is clear.  Cervix unremarkable.  Uterus is upper limits of normal size.  Adnexa unremarkable. EXTREMITIES:  Trace edema. NEUROLOGIC:  Grossly within normal limits.  IMPRESSION: 1. Menorrhagia with associated anemia. 2. Endometrial polyp. 3. History of right salpingo-oophorectomy. 4. Hypertension. 5. Bipolar disorder.  PLAN:  The patient will undergo hysteroscopy with MyoSure resection of the endometrial polyp.  The nature of the procedure have been discussed. The risks have been explained including the risk of infection.  The risk of hemorrhage that could require transfusion with the risk of AIDS or hepatitis.  Excessive bleeding could require hysterectomy.  The risk of perforation with injury to adjacent organs such as bowel that could require further exploratory surgery.  Risk of deep venous thrombosis and pulmonary embolus.  The patient expressed understanding of the indications and risks indication.     Darlyn Chamber, M.D.     JSM/MEDQ  D:  09/29/2017  T:  09/29/2017  Job:  [id number]

## 2017-09-30 ENCOUNTER — Other Ambulatory Visit (HOSPITAL_COMMUNITY): Payer: Self-pay

## 2017-09-30 ENCOUNTER — Other Ambulatory Visit (HOSPITAL_COMMUNITY): Payer: BC Managed Care – PPO

## 2017-09-30 ENCOUNTER — Ambulatory Visit (HOSPITAL_COMMUNITY): Payer: Self-pay

## 2017-09-30 NOTE — Progress Notes (Signed)
Christine [name] is a 42 y.o., married, unemployed, African American female, who is well known to this Probation officer due to previous admits in [place].   Patient transitioned from [facility] to [place].  Pt had presented referred to [facility] from her OP therapist, Binnie Rail. Pt was previously in [place] approximately 03/2017.  Pt reported an increase of depression symptoms preventing her from completing ADL's and creating increased hopelessness. Pt reported she had begun cutting again. Pt has regular therapist and psychiatrist within agency and is happy with services however felt, along with her therapist,  that more intensive services are needed.  Pt continues to deny active SI/HI/AVH however endorses passive SI.   Patients Currently Reported Symptoms/Problems: Pt endorses depressed mood with anhedonia, poor motivation, irritability, tearfulness, isolating behaviors, poor concentration, low energy, and sleep disturbances. Pt reports history of mania Collateral Involvement: Prior admission to PHP, EPIC notes Individual's Strengths: Pt has supportive husband, friends, and regular providers.  Individual's Preferences: Pt prefers intensive treatment, does not want hospitalization Individual's Abilities: ability to work a program of recovery A:  Re-oriented pt to [place].  Provided pt with an orientation folder.  Informed [name] and Binnie Rail, LCAS of admit.  Encouraged support groups, along with [facility] with [facility].  R:  Pt receptive.          Carlis Abbott, RITA, M.Ed,CNA

## 2017-09-30 NOTE — Progress Notes (Signed)
    Daily Group Progress Note  Program: IOP  Group Time: 9:00-12:00 Participation Level: Active Behavioral Response: Appropriate and sharing Type of Therapy:  Group Summary of Progress: The first part of group today was presented by the pharmacist.  She addressed the issues and concerns of group members about their medications, and side effects. The theme for the second part of group was, "Setting boundaries and feeling comfortable in the moment as you trust how you're feeling."  The therapist discussed with group the importance of verbalizing your needs clearly whether or not the receiver understands it. The patient is new to group.  She shared with group that she was diagnosed with depression since she was 42 years old and she is also bipolar.   She expressed that she doesn't have a problem saying "No" to people and setting her boundaries.  She moved from [facility]. to [place] to set boundaries from her mother.  Her mother is very emotionally disconnected with her mental illness and says that she's too old to understand what's going on with her.  Her brother doesn't seem to want to know about what's going on with her neither.  She can talk to her father and he's very understanding about what she's going through.   Her husband and her children are her support system.  She communicates her needs to a very small group of friends and isolates herself from relationship.   She has been out of work for seven years as a result of her depression and bipolar. The patient was active, emotional and tearful at times.  She participated in discussion and shared a lot of insight with group.  Her behavioral response was appropriate.  Nancie Neas, LPC

## 2017-10-01 ENCOUNTER — Other Ambulatory Visit (HOSPITAL_COMMUNITY): Payer: BC Managed Care – PPO | Admitting: Family

## 2017-10-01 ENCOUNTER — Other Ambulatory Visit (HOSPITAL_COMMUNITY): Payer: Self-pay

## 2017-10-02 ENCOUNTER — Other Ambulatory Visit (HOSPITAL_COMMUNITY): Payer: Self-pay

## 2017-10-02 ENCOUNTER — Ambulatory Visit (HOSPITAL_COMMUNITY): Payer: Self-pay

## 2017-10-02 ENCOUNTER — Other Ambulatory Visit (HOSPITAL_COMMUNITY): Payer: Self-pay | Admitting: Psychiatry

## 2017-10-02 ENCOUNTER — Other Ambulatory Visit (HOSPITAL_COMMUNITY): Payer: BC Managed Care – PPO | Admitting: Psychiatry

## 2017-10-02 DIAGNOSIS — F0789 Other personality and behavioral disorders due to known physiological condition: Secondary | ICD-10-CM

## 2017-10-02 DIAGNOSIS — Z79899 Other long term (current) drug therapy: Secondary | ICD-10-CM

## 2017-10-02 DIAGNOSIS — F3162 Bipolar disorder, current episode mixed, moderate: Secondary | ICD-10-CM

## 2017-10-02 DIAGNOSIS — F320 Major depressive disorder, single episode, mild: Secondary | ICD-10-CM

## 2017-10-02 NOTE — Progress Notes (Signed)
Progress note Christine [name] said the 0.5 level was drawn before the lithium was increased to 1200 mg daily.  She is feeling better since the increase with more energy and less depression.   Plan is to re draw a level today if a vein can be found.  She has very hard veins to work with.

## 2017-10-03 ENCOUNTER — Other Ambulatory Visit (HOSPITAL_COMMUNITY): Payer: Self-pay

## 2017-10-03 ENCOUNTER — Other Ambulatory Visit (HOSPITAL_COMMUNITY): Payer: BC Managed Care – PPO | Attending: Psychiatry | Admitting: Psychiatry

## 2017-10-03 ENCOUNTER — Telehealth: Payer: Self-pay | Admitting: *Deleted

## 2017-10-03 DIAGNOSIS — F3162 Bipolar disorder, current episode mixed, moderate: Secondary | ICD-10-CM | POA: Insufficient documentation

## 2017-10-03 DIAGNOSIS — F3160 Bipolar disorder, current episode mixed, unspecified: Secondary | ICD-10-CM | POA: Diagnosis present

## 2017-10-03 DIAGNOSIS — R463 Overactivity: Secondary | ICD-10-CM | POA: Insufficient documentation

## 2017-10-03 DIAGNOSIS — F0789 Other personality and behavioral disorders due to known physiological condition: Secondary | ICD-10-CM

## 2017-10-03 DIAGNOSIS — F3132 Bipolar disorder, current episode depressed, moderate: Secondary | ICD-10-CM

## 2017-10-03 NOTE — Progress Notes (Signed)
    Daily Group Progress Note  Program: IOP  Participation Level: Active Behavioral Response: Appropriate and sharing Type of Therapy:  Group Summary of Progress: The theme in group today was, "Forgiving ourselves for the choices that we made."  The therapist facilitated the session by helping group members understand that mistakes are unavoidable and we can learn from our mistakes and grow from them.  The second part of group was facilitated by the chaplain on grief.  "What loses you are experiencing?"  The chaplain encouraged group to be able to let go a little bit in the moment so that you don't lose yourself. The patient shared that she has two wonderful daughters.  Her older daughter is very appreciative of been "Chosen."  The adoption process was complicated, but she happy that she and her husband worked it out.  She was in a better emotional state when she adopted her daughters.  She shared in grief counseling that she doesn't trust herself with her depression and anxiety. Patient was active and supportive in group.  Her behavioral response was appropriate.  Nancie Neas, LPC

## 2017-10-03 NOTE — Telephone Encounter (Signed)
Faxed ROI to [place] @ [facility] 10/03/17 @ 3:17pm, Reference # [id number]

## 2017-10-06 ENCOUNTER — Other Ambulatory Visit (HOSPITAL_COMMUNITY): Payer: Self-pay

## 2017-10-06 ENCOUNTER — Telehealth (HOSPITAL_COMMUNITY): Payer: Self-pay | Admitting: Psychiatry

## 2017-10-06 ENCOUNTER — Other Ambulatory Visit (HOSPITAL_COMMUNITY): Payer: BC Managed Care – PPO

## 2017-10-06 NOTE — Progress Notes (Signed)
[name]:  Assessment: Iron deficiency anemia due to chronic blood loss 42 y.o. female with menorrhagia with regular cycle likely attributable to presence of uterine fibroids associated with severe microcytic hypochromic anemia and profound iron depletion.  Oral replacement attempt is unlikely to succeed considering the magnitude of iron depletion and we should proceed directly to parenteral iron replacement to facilitate patient's recovery while she is undergoing additional evaluations regarding management of her uterine fibroids.  Plan: -Labs today as outlined below. -IV Feraheme x1 next week. - Return to clinic in 4 weeks with labs obtained 2-3 days prior for continued monitoring of iron replacement deficiency.   Voice recognition software was used and creation of this note. Despite my best effort at editing the text, some misspelling/errors may have occurred. Orders Placed This Encounter  Procedures  . CBC with Differential (Cancer Center Only)    Standing Status:   Future    Number of Occurrences:   1    Standing Expiration Date:   09/16/2018  . CMP ([facility] only)    Standing Status:   Future    Number of Occurrences:   1    Standing Expiration Date:   09/16/2018  . Lactate dehydrogenase (LDH)    Standing Status:   Future    Number of Occurrences:   1    Standing Expiration Date:   09/16/2018  . Iron and TIBC    Standing Status:   Future    Number of Occurrences:   1    Standing Expiration Date:   09/16/2018  . Ferritin    Standing Status:   Future    Number of Occurrences:   1    Standing Expiration Date:   09/16/2018  . Vitamin B12    Standing Status:   Future    Number of Occurrences:   1    Standing Expiration Date:   09/16/2018  . Methylmalonic acid, serum    Standing Status:   Future    Number of Occurrences:   1    Standing Expiration Date:   09/16/2018  . Haptoglobin    Standing Status:   Future    Number of Occurrences:   1     Standing Expiration Date:   09/16/2018  . CBC with Differential (Cancer Center Only)    Standing Status:   Future    Standing Expiration Date:   09/16/2018  . CMP ([facility] only)    Standing Status:   Future    Standing Expiration Date:   09/16/2018  . Ferritin    Standing Status:   Future    Standing Expiration Date:   09/16/2018  . Iron and TIBC    Standing Status:   Future    Standing Expiration Date:   09/16/2018  . Direct antiglobulin test ([facility])    Standing Status:   Future    Number of Occurrences:   1    Standing Expiration Date:   09/16/2018    All questions were answered.  . The patient knows to call the clinic with any problems, questions or concerns.  This note was electronically signed.    History of Presenting Illness Christine [name] 42 y.o. presenting to [facility] for iron deficiency anemia due to recurrent blood loss, referred by Maude Leriche, PA-C.  Patient reports heavy menstrual periods for an extended period of time.  Her cycles usually last every 25 days with 7 days of bleeding with for 5  days of heavy bleeding requiring 4-5 maxipads per day.  Prior to most recent admission in January, patient had a 10-day long period of bleeding.  Was admitted to the hospital on 08/08/17 received transfusion of 3 units of packed red blood cells as well as an IV iron infusion with Feraheme.  Patient denies fevers, chills, night sweats.  Denies epistaxis, hematemesis, hemoptysis.  Denies melena, hematochezia, hematuria.  No vaginal bleeding outside of menstruation.  At the present time, patient scheduled for gastroenterology evaluation in February as well as undergoing gynecological evaluation, last being seen at the end of January.  Patient is planned for follow-up next Monday for recent diagnosis of uterine fibroids.  Oncological/hematological History: --Labs, 09/18/16: Hgb 7.1, MCV 69.0, MCH 18.3, MCHC     ..., RDW 26.7, Plt 408; TSH 0.58 --Labs, 08/29/17: Hgb  7.9, MCV 70.1, MCH 20.9, MCHC 29.8, RDW 22.5, Plt 368; Fe 28, FeSat 7%, TIBC 428, Ferritin 2, Folate 16.5, Vit B12 372  Medical History: Past Medical History:  Diagnosis Date  . Anemia   . Anxiety   . Bipolar 1 disorder ([place])   . Depression   . Fibroids   . High blood pressure   . Mental disorder   . PCOS (polycystic ovarian syndrome)     Surgical History: Past Surgical History:  Procedure Laterality Date  . DIAGNOSTIC LAPAROSCOPY    . DILATION AND CURETTAGE OF UTERUS    . HYSTEROSCOPY W/D&C  07/22/2011   Procedure: DILATATION AND CURETTAGE /HYSTEROSCOPY;  Surgeon: Cyril Mourning, MD;  Location: [facility];  Service: Gynecology;;    Family History: Family History  Problem Relation Age of Onset  . Anxiety disorder Mother   . Depression Mother     Social History: Social History   Socioeconomic History  . Marital status: Married    Spouse name: Not on file  . Number of children: Not on file  . Years of education: Not on file  . Highest education level: Not on file  Social Needs  . Financial resource strain: Somewhat hard  . Food insecurity - worry: Sometimes true  . Food insecurity - inability: Sometimes true  . Transportation needs - medical: No  . Transportation needs - non-medical: No  Occupational History  . Not on file  Tobacco Use  . Smoking status: Never Smoker  . Smokeless tobacco: Never Used  Substance and Sexual Activity  . Alcohol use: No    Comment: rare  . Drug use: No  . Sexual activity: Yes    Partners: Male    Birth control/protection: None  Other Topics Concern  . Not on file  Social History Narrative  . Not on file    Allergies: Allergies  Allergen Reactions  . Penicillins Rash    Has patient had a PCN reaction causing immediate rash, facial/tongue/throat swelling, SOB or lightheadedness with hypotension: Yes Has patient had a PCN reaction causing severe rash involving mucus membranes or skin necrosis: Unknown Has patient had a PCN  reaction that required hospitalization: No Has patient had a PCN reaction occurring within the last 10 years: No If all of the above answers are "NO", then may proceed with Cephalosporin use.     Medications:  Current Outpatient Medications  Medication Sig Dispense Refill  . amLODipine (NORVASC) 10 MG tablet Take 1 tablet (10 mg total) by mouth daily. 30 tablet 0  . Brexpiprazole ([name]) 2 MG TABS Take 2 mg by mouth daily. 30 tablet 0  . clonazePAM (KLONOPIN) 0.5 MG  tablet Take 1 tablet (0.5 mg total) by mouth 2 (two) times daily as needed. 30 tablet 0  . fluticasone (FLONASE) 50 MCG/ACT nasal spray SHAKE LQ AND U 2 SPRAYS IEN QD  4  . ibuprofen (ADVIL,MOTRIN) 200 MG tablet Take 400 mg by mouth 2 (two) times daily as needed for headache.    . lamoTRIgine (LAMICTAL) 150 MG tablet Take 2 tablets (300 mg total) by mouth daily. 60 tablet 0  . lithium carbonate (LITHOBID) 300 MG CR tablet Take 2 tablets (600 mg total) by mouth 2 (two) times daily. 120 tablet 0  . meloxicam (MOBIC) 7.5 MG tablet Take 7.5 mg by mouth 2 (two) times daily.  2   No current facility-administered medications for this visit.     Review of Systems: Review of Systems  Genitourinary: Positive for menstrual problem.   All other systems reviewed and are negative.    PHYSICAL EXAMINATION Blood pressure (!) 157/98, pulse 95, temperature 98.8 F (37.1 C), temperature source Oral, resp. rate 18, height '5\' 5"'$  (1.651 m), weight (!) 337 lb 3.2 oz (153 kg), last menstrual period 08/18/2017, SpO2 100 %.  ECOG PERFORMANCE STATUS: 1 - Symptomatic but completely ambulatory  Physical Exam  Constitutional: She is oriented to person, place, and time and well-developed, well-nourished, and in no distress. No distress.  HENT:  Head: Normocephalic and atraumatic.  Mouth/Throat: Oropharynx is clear and moist. No oropharyngeal exudate.  Eyes: Conjunctivae and EOM are normal. Pupils are equal, round, and reactive to light. No  scleral icterus.  Neck: Normal range of motion. Neck supple. No thyromegaly present.  Cardiovascular: Normal rate, regular rhythm, normal heart sounds and intact distal pulses.  No murmur heard. Pulmonary/Chest: Breath sounds normal. No respiratory distress. She has no wheezes. She has no rales.  Abdominal: Soft. Bowel sounds are normal. She exhibits no distension and no mass. There is no tenderness. There is no guarding.  Musculoskeletal: She exhibits no edema.  Lymphadenopathy:    She has no cervical adenopathy.  Neurological: She is alert and oriented to person, place, and time. She has normal reflexes. No cranial nerve deficit.  Skin: Skin is warm and dry. No rash noted. She is not diaphoretic. No erythema. There is pallor.     LABORATORY DATA: I have personally reviewed the data as listed: Appointment on 09/16/2017  Component Date Value Ref Range Status  . DAT, complement 09/16/2017 NEG   Final  . DAT, IgG 09/16/2017    Final                   Value:NEG Performed at [facility], [place] [address] [place], [place] [address]   . Haptoglobin 09/16/2017 141  34 - 200 mg/dL Final   Comment: (NOTE) Performed At: [facility] [place], [place] [id number] Rush Farmer MD QP:[id number] Performed at [facility], [place] [address], [place], [place] [address]   . Methylmalonic Acid, Quantitative 09/16/2017 87  0 - 378 nmol/L Final  . Disclaimer: 09/16/2017 Comment   Final   Comment: (NOTE) This test was developed and its performance characteristics determined by [facility]. It has not been cleared or approved by the Food and Drug Administration. Performed At: [facility] [place], [place] [id number] Rush Farmer MD KN:[id number] Performed at [facility], [place] [address], [place], [place] [address]   . Vitamin B-12 09/16/2017 294  180 - 914 pg/mL Final   Comment:  (NOTE) This  assay is not validated for testing neonatal or myeloproliferative syndrome specimens for Vitamin B12 levels. Performed at [facility], [place] [address] [place], [place] [address]   . Ferritin 09/16/2017 27  9 - 269 ng/mL Final   Performed at [facility], [place] [address] [place], [place] [address]  . Iron 09/16/2017 30* 41 - 142 ug/dL Final  . TIBC 09/16/2017 349  236 - 444 ug/dL Final  . Saturation Ratios 09/16/2017 9* 21 - 57 % Final  . UIBC 09/16/2017 319  ug/dL Final   Performed at [facility], [place] [address] [place], [place] [address]  . LDH 09/16/2017 251* 125 - 245 U/L Final   Performed at [facility], [place] [address], [place], [place] [address]  . Sodium 09/16/2017 138  136 - 145 mmol/L Final  . Potassium 09/16/2017 3.8  3.5 - 5.1 mmol/L Final  . Chloride 09/16/2017 108  98 - 109 mmol/L Final  . CO2 09/16/2017 21* 22 - 29 mmol/L Final  . Glucose, Bld 09/16/2017 87  70 - 140 mg/dL Final  . BUN 09/16/2017 8  7 - 26 mg/dL Final  . Creatinine 09/16/2017 0.87  0.60 - 1.10 mg/dL Final  . Calcium 09/16/2017 9.4  8.4 - 10.4 mg/dL Final  . Total Protein 09/16/2017 7.4  6.4 - 8.3 g/dL Final  . Albumin 09/16/2017 4.0  3.5 - 5.0 g/dL Final  . AST 09/16/2017 10  5 - 34 U/L Final  . ALT 09/16/2017 12  0 - 55 U/L Final  . Alkaline Phosphatase 09/16/2017 68  40 - 150 U/L Final  . Total Bilirubin 09/16/2017 0.4  0.2 - 1.2 mg/dL Final  . GFR, Est Non Af Am 09/16/2017 >60  >60 mL/min Final  . GFR, Est AFR Am 09/16/2017 >60  >60 mL/min Final   Comment: (NOTE) The eGFR has been calculated using the CKD EPI equation. This calculation has not been validated in all clinical situations. eGFR's persistently <60 mL/min signify possible Chronic Kidney Disease.   Georgiann Hahn gap 09/16/2017 9  3 - 11 Final   Performed at [facility], [place] [address] [place], [place] [address]  . WBC  Count 09/16/2017 10.4* 3.9 - 10.3 K/uL Final  . RBC 09/16/2017 4.52  3.70 - 5.45 MIL/uL Final  . Hemoglobin 09/16/2017 10.5* 11.6 - 15.9 g/dL Final  . HCT 09/16/2017 33.6* 34.8 - 46.6 % Final  . MCV 09/16/2017 74.3* 79.5 - 101.0 fL Final  . MCH 09/16/2017 23.2* 25.1 - 34.0 pg Final  . MCHC 09/16/2017 31.1* 31.5 - 36.0 g/dL Final  . RDW 09/16/2017 32.6* 11.2 - 14.5 % Final  . Platelet Count 09/16/2017 341  145 - 400 K/uL Final  . Neutrophils Relative % 09/16/2017 77  % Final  . Neutro Abs 09/16/2017 8.0* 1.5 - 6.5 K/uL Final  . Lymphocytes Relative 09/16/2017 16  % Final  . Lymphs Abs 09/16/2017 1.7  0.9 - 3.3 K/uL Final  . Monocytes Relative 09/16/2017 6  % Final  . Monocytes Absolute 09/16/2017 0.6  0.1 - 0.9 K/uL Final  . Eosinophils Relative 09/16/2017 1  % Final  . Eosinophils Absolute 09/16/2017 0.1  0.0 - 0.5 K/uL Final  . Basophils Relative 09/16/2017 0  % Final  . Basophils Absolute 09/16/2017 0.0  0.0 - 0.1 K/uL Final   Performed at [facility], [place] [address] [place], [place] [address]  Ardath Sax, MD

## 2017-10-06 NOTE — Assessment & Plan Note (Signed)
42 y.o. female with menorrhagia with regular cycle likely attributable to presence of uterine fibroids associated with severe microcytic hypochromic anemia and profound iron depletion.  Oral replacement attempt is unlikely to succeed considering the magnitude of iron depletion and we should proceed directly to parenteral iron replacement to facilitate patient's recovery while she is undergoing additional evaluations regarding management of her uterine fibroids.  Plan: -Labs today as outlined below. -IV Feraheme x1 next week. - Return to clinic in 4 weeks with labs obtained 2-3 days prior for continued monitoring of iron replacement deficiency.

## 2017-10-07 ENCOUNTER — Other Ambulatory Visit (HOSPITAL_COMMUNITY): Payer: BC Managed Care – PPO

## 2017-10-07 ENCOUNTER — Other Ambulatory Visit (HOSPITAL_COMMUNITY): Payer: BC Managed Care – PPO | Admitting: Psychiatry

## 2017-10-07 ENCOUNTER — Other Ambulatory Visit (HOSPITAL_COMMUNITY): Payer: Self-pay

## 2017-10-07 ENCOUNTER — Ambulatory Visit (HOSPITAL_COMMUNITY): Payer: Self-pay

## 2017-10-07 DIAGNOSIS — F3162 Bipolar disorder, current episode mixed, moderate: Secondary | ICD-10-CM | POA: Diagnosis not present

## 2017-10-07 NOTE — Progress Notes (Signed)
    Daily Group Progress Note  Program: IOP  Group Time: 9:00-12:00  Participation Level: Active  Behavioral Response: Appropriate  Type of Therapy:  Group Therapy  Summary of Progress: Pt. Reported that she was having a "good day". Pt. Discussed the unpredictability of her moods, not knowing from one day to the next how she will feel. Pt. Discussed feeling guilty as a mother about her responses toward her children and not knowing whether they are warranted or whether or not they are because of her bipolar. Pt. Was encouraged to practice mindfulness in order to develop some awareness of the nature of her responses towards her children and some of the patterns around her triggers. Pt. Connected and received support from another Pt. Who also is challenged by rapid cycling bipolar disorder.     Nancie Neas, LPC

## 2017-10-08 ENCOUNTER — Other Ambulatory Visit (HOSPITAL_COMMUNITY): Payer: Self-pay

## 2017-10-08 ENCOUNTER — Other Ambulatory Visit (HOSPITAL_COMMUNITY): Payer: BC Managed Care – PPO | Admitting: Psychiatry

## 2017-10-08 ENCOUNTER — Other Ambulatory Visit (HOSPITAL_COMMUNITY): Payer: BC Managed Care – PPO

## 2017-10-08 DIAGNOSIS — F3162 Bipolar disorder, current episode mixed, moderate: Secondary | ICD-10-CM

## 2017-10-08 NOTE — Progress Notes (Signed)
    Daily Group Progress Note  Program: IOP  Group Time: 9:00-12:00 Participation Level: Minimal Behavioral Response: Sharing, Appropriate Type of Therapy:  Group Summary of Progress: The group session focused on themes involving bringing awareness to how thoughts may trigger anxiety. Members also discussed the utilization of positive self-talk and affirmations during times of racing thoughts and negative conflict. Towards the second part of the group session, topics on grief were discussed. Patient stated that she has been taking care of some family who has been sick. Mentioned that they were getting better along with herself. Patient related to other group members about how anxiety and depression may trigger thoughts that lead to unwanted behavior. Patient also remained attentive throughout the group session.  Nancie Neas, LPC

## 2017-10-09 ENCOUNTER — Other Ambulatory Visit (HOSPITAL_COMMUNITY): Payer: BC Managed Care – PPO

## 2017-10-09 ENCOUNTER — Ambulatory Visit (HOSPITAL_COMMUNITY): Payer: Self-pay | Admitting: Licensed Clinical Social Worker

## 2017-10-09 ENCOUNTER — Ambulatory Visit (HOSPITAL_COMMUNITY): Payer: Self-pay

## 2017-10-09 NOTE — Progress Notes (Signed)
    Daily Group Progress Note  Program: IOP  Group Time: 9:00-12:00  Participation Level: Active  Behavioral Response: Appropriate  Type of Therapy:  Group Therapy  Summary of Progress: Pt. Presented as talkative, engaged in the group process. Pt. Continued to report that she was "doing ok" that not much had changed for her regarding her mood. Pt. Shared positive feedback to other group member who was discharging from the group. Pt. Participated in discussion about acceptance of diagnosis, understanding that we should not define ourselves by our diagnosis, and identifying the other parts of ourselves that complete our identity.     Nancie Neas, LPC

## 2017-10-10 ENCOUNTER — Other Ambulatory Visit (HOSPITAL_COMMUNITY): Payer: Self-pay

## 2017-10-10 ENCOUNTER — Other Ambulatory Visit (HOSPITAL_COMMUNITY): Payer: BC Managed Care – PPO

## 2017-10-10 ENCOUNTER — Other Ambulatory Visit (HOSPITAL_COMMUNITY): Payer: BC Managed Care – PPO | Admitting: Psychiatry

## 2017-10-10 DIAGNOSIS — Z79899 Other long term (current) drug therapy: Secondary | ICD-10-CM

## 2017-10-10 DIAGNOSIS — F3162 Bipolar disorder, current episode mixed, moderate: Secondary | ICD-10-CM

## 2017-10-10 MED ORDER — LAMOTRIGINE 150 MG PO TABS: 300.0000 mg | ORAL_TABLET | Freq: Every day | ORAL | 0 refills | Status: DC

## 2017-10-10 MED ORDER — BREXPIPRAZOLE 2 MG PO TABS: 2.0000 mg | ORAL_TABLET | Freq: Every day | ORAL | 0 refills | Status: DC

## 2017-10-10 MED ORDER — LITHIUM CARBONATE ER 300 MG PO TBCR: 600.0000 mg | EXTENDED_RELEASE_TABLET | Freq: Two times a day (BID) | ORAL | 0 refills | Status: DC

## 2017-10-10 NOTE — Patient Instructions (Signed)
D:  Patient successfully completed MH-IOP today.  A:  Will discharge today.  Follow up with [name] on 10-20-17 @ 8:30 a.m and Binnie Rail, LCAS on 10-21-17 @ 2pm.  Encouraged support groups.  Recommended [facility] (Every Tuesday at 5:30pm).  R:  Pt receptive.

## 2017-10-10 NOTE — Progress Notes (Signed)
Christine [name] is a 42 y.o. , married, unemployed, African American female, who is well known to this Probation officer due to previous admits in [place].   Patient transitioned from [facility] to [facility].  Pt had presented referred to [facility] from her OP therapist, Binnie Rail. Pt was previously in [place] approximately 03/2017. Pt reported an increase of depression symptoms preventing her from completing ADL's and creating increased hopelessness. Pt reported she had begun cutting again. Pt has regular therapist and psychiatrist within agency and is happy with services however felt, along with her therapist, that more intensive services are needed. Pt continues to deny SI/HI/AVH.  According to pt she is ready for discharge today; isn't feeling as depressed.  "I learned that I'm still grieving about the loss of myself."  Pt states she will continue to work on self-care.  Plans to attend some groups at [facility] [place].  Pt is also interested in [facility] with Binnie Rail, LCAS.  A:  D/C today.  F/U with [name] on 10-20-17 @ 8:30 a.m and Binnie Rail, LCAS on 10-21-17 @ 2pm.  Strongly encouraged [facility] along with groups at [facility] [place].  R:  Pt receptive.       Carlis Abbott, RITA, M.Ed,CNA

## 2017-10-10 NOTE — Progress Notes (Signed)
  [facility] Discharge Summary  Christine [name] [id number]  Admission date: 09/29/2017 Discharge date: 10/10/2017  Reason for admission: Depression and Bipolar (Mixed), Patient transitioned from Partial  hospitalization to Intensive Outpatient Program.   Per assessment note- Christine [name] was diagnosed with bipolar disorder 10 years ago and does have a history of mania with no need for sleep, grandiosity, over activity and elated mood.  She moved to [facility] for a job but did not get the help with the bipolar disorder and was hospitalized 3 times with continued poor adjustment.  Her family in [place] were not supportive and she had to quit her job due to the illness, so they moved back to [place] in June to get better mental health care.  Finances are an issue in that she is on temporary disability and her husband lost his job. The bipolar is mixed in that she is still depressed and hypomanic currently.  She just started [name] which is helping the hypomanic symptoms.  Her husband is supportive and her 2 adopted children are doing okay.  She has a history of self injurious behavior but not for about 4 or 5 years.  Chemical Use History:  Was denied  Family of Origin Issues:  Husband and Children    Progress in Program Toward Treatment Goals: attended and participated  with group session. Christine [name] reports she learning a lot with dealing with grief will continue to work on redefining herself. Reports her coping skill as improved this time around. Patient continues to deny suicidal or homicidal ideations at discharge. She is requesting medication refills to her [name] and Lamictal. Patient to follow-up continue taken lithium at current dose. Li level is pending.   Progress (rationale):  Will be ongoing, Christine [name] on 10/20/2017 and Binnie Rail LCAS on 10/21/2017    Nicoletta Dress level was collected 10/10/2017 at discharge- pending  results Take all medications as prescribed. Keep all follow-up appointments as scheduled.  Do not consume alcohol or use illegal drugs while on prescription medications. Report any adverse effects from your medications to your primary care provider promptly.  In the event of recurrent symptoms or worsening symptoms, call 911, a crisis hotline, or go to the nearest emergency department for evaluation.     Derrill Center, NP 10/10/2017

## 2017-10-10 NOTE — Addendum Note (Signed)
Addended by: Derrill Center on: 10/10/2017 09:24 AM   Modules accepted: Orders

## 2017-10-11 LAB — LITHIUM LEVEL: Lithium Lvl: 0.6 mmol/L (ref 0.6–1.2)

## 2017-10-13 ENCOUNTER — Other Ambulatory Visit (HOSPITAL_COMMUNITY): Payer: BC Managed Care – PPO

## 2017-10-13 ENCOUNTER — Inpatient Hospital Stay: Payer: BC Managed Care – PPO | Attending: Hematology and Oncology

## 2017-10-13 DIAGNOSIS — Z79899 Other long term (current) drug therapy: Secondary | ICD-10-CM | POA: Diagnosis not present

## 2017-10-13 DIAGNOSIS — N920 Excessive and frequent menstruation with regular cycle: Secondary | ICD-10-CM | POA: Diagnosis not present

## 2017-10-13 DIAGNOSIS — D500 Iron deficiency anemia secondary to blood loss (chronic): Secondary | ICD-10-CM | POA: Diagnosis not present

## 2017-10-13 DIAGNOSIS — D259 Leiomyoma of uterus, unspecified: Secondary | ICD-10-CM | POA: Insufficient documentation

## 2017-10-13 DIAGNOSIS — F319 Bipolar disorder, unspecified: Secondary | ICD-10-CM | POA: Insufficient documentation

## 2017-10-13 DIAGNOSIS — E282 Polycystic ovarian syndrome: Secondary | ICD-10-CM | POA: Diagnosis not present

## 2017-10-13 DIAGNOSIS — F419 Anxiety disorder, unspecified: Secondary | ICD-10-CM | POA: Diagnosis not present

## 2017-10-13 LAB — CBC WITH DIFFERENTIAL (CANCER CENTER ONLY)
Basophils Absolute: 0 10*3/uL (ref 0.0–0.1)
Basophils Relative: 0 %
Eosinophils Absolute: 0.1 10*3/uL (ref 0.0–0.5)
Eosinophils Relative: 1 %
HCT: 34.8 % (ref 34.8–46.6)
Hemoglobin: 10.6 g/dL — ABNORMAL LOW (ref 11.6–15.9)
Lymphocytes Relative: 20 %
Lymphs Abs: 1.8 10*3/uL (ref 0.9–3.3)
MCH: 26.3 pg (ref 25.1–34.0)
MCHC: 30.5 g/dL — ABNORMAL LOW (ref 31.5–36.0)
MCV: 86.4 fL (ref 79.5–101.0)
Monocytes Absolute: 0.5 10*3/uL (ref 0.1–0.9)
Monocytes Relative: 6 %
Neutro Abs: 6.9 10*3/uL — ABNORMAL HIGH (ref 1.5–6.5)
Neutrophils Relative %: 73 %
Platelet Count: 291 10*3/uL (ref 145–400)
RBC: 4.03 MIL/uL (ref 3.70–5.45)
RDW: 27.6 % — ABNORMAL HIGH (ref 11.2–14.5)
WBC Count: 9.4 10*3/uL (ref 3.9–10.3)

## 2017-10-13 LAB — CMP (CANCER CENTER ONLY)
ALT: 15 U/L (ref 0–55)
AST: 9 U/L (ref 5–34)
Albumin: 3.7 g/dL (ref 3.5–5.0)
Alkaline Phosphatase: 66 U/L (ref 40–150)
Anion gap: 8 (ref 3–11)
BUN: 8 mg/dL (ref 7–26)
CO2: 25 mmol/L (ref 22–29)
Calcium: 9.5 mg/dL (ref 8.4–10.4)
Chloride: 107 mmol/L (ref 98–109)
Creatinine: 0.87 mg/dL (ref 0.60–1.10)
GFR, Est AFR Am: >60 mL/min (ref >60)
GFR, Estimated: >60 mL/min (ref >60)
Glucose, Bld: 135 mg/dL (ref 70–140)
Potassium: 4.1 mmol/L (ref 3.5–5.1)
Sodium: 140 mmol/L (ref 136–145)
Total Bilirubin: 0.4 mg/dL (ref 0.2–1.2)
Total Protein: 7 g/dL (ref 6.4–8.3)

## 2017-10-13 LAB — IRON AND TIBC
Iron: 41 ug/dL (ref 41–142)
Saturation Ratios: 14 % — ABNORMAL LOW (ref 21–57)
TIBC: 296 ug/dL (ref 236–444)
UIBC: 256 ug/dL

## 2017-10-13 LAB — FERRITIN: Ferritin: 44 ng/mL (ref 9–269)

## 2017-10-13 NOTE — Progress Notes (Signed)
    Daily Group Progress Note  Program: IOP  Group Time: 9:00-12:00  Participation Level: Active  Behavioral Response: Appropriate  Type of Therapy:  Group Therapy  Summary of Progress: Pt. Presented with bright affect, talkative, engaged in the group process. Pt. Met with the case manager and nurse practitioner in preparation for discharge. Pt. Discussed her pattern of perfectionism in professional and personal matters that has made her vulnerable in the past for anxiety and depression. Pt. Participated in discussion about acceptance of self in response to thoughts of perfectionism. Pt. Listened to and responded to reflective reading.     Nancie Neas, LPC

## 2017-10-14 ENCOUNTER — Other Ambulatory Visit (HOSPITAL_COMMUNITY): Payer: BC Managed Care – PPO

## 2017-10-15 ENCOUNTER — Telehealth: Payer: Self-pay | Admitting: Hematology and Oncology

## 2017-10-15 ENCOUNTER — Inpatient Hospital Stay: Payer: BC Managed Care – PPO

## 2017-10-15 ENCOUNTER — Encounter: Payer: Self-pay | Admitting: Hematology and Oncology

## 2017-10-15 ENCOUNTER — Inpatient Hospital Stay (HOSPITAL_BASED_OUTPATIENT_CLINIC_OR_DEPARTMENT_OTHER): Payer: BC Managed Care – PPO | Admitting: Hematology and Oncology

## 2017-10-15 VITALS — BP 147/94 | HR 109 | Temp 98.6°F | Resp 16 | Ht 63.5 in | Wt 343.3 lb

## 2017-10-15 VITALS — BP 120/80 | HR 89 | Resp 16

## 2017-10-15 DIAGNOSIS — D500 Iron deficiency anemia secondary to blood loss (chronic): Secondary | ICD-10-CM | POA: Diagnosis not present

## 2017-10-15 DIAGNOSIS — N920 Excessive and frequent menstruation with regular cycle: Secondary | ICD-10-CM

## 2017-10-15 MED ORDER — SODIUM CHLORIDE 0.9 % IV SOLN
Freq: Once | INTRAVENOUS | Status: AC
  Administered 2017-10-15: 10:00:00 via INTRAVENOUS

## 2017-10-15 MED ORDER — SODIUM CHLORIDE 0.9 % IV SOLN
510.0000 mg | Freq: Once | INTRAVENOUS | Status: AC
  Administered 2017-10-15: 510 mg via INTRAVENOUS
  Filled 2017-10-15: qty 17

## 2017-10-15 MED ORDER — FAMOTIDINE IN NACL 20-0.9 MG/50ML-% IV SOLN
20.0000 mg | Freq: Once | INTRAVENOUS | Status: DC
  Filled 2017-10-15: qty 50

## 2017-10-15 MED ORDER — SODIUM CHLORIDE 0.9 % IV SOLN
20.0000 mg | Freq: Once | INTRAVENOUS | Status: AC
  Administered 2017-10-15: 20 mg via INTRAVENOUS
  Filled 2017-10-15: qty 2

## 2017-10-15 NOTE — H&P (Signed)
Patient name Christine [name] DICTATION#[id number] CSN# [id number]  Darlyn Chamber, MD 10/15/2017 7:42 AM

## 2017-10-15 NOTE — Patient Instructions (Signed)

## 2017-10-15 NOTE — Telephone Encounter (Signed)
Scheduled appt per 3/13 los - Gave patient AVS and calender per los.  

## 2017-10-16 ENCOUNTER — Other Ambulatory Visit (HOSPITAL_COMMUNITY): Payer: BC Managed Care – PPO

## 2017-10-17 ENCOUNTER — Inpatient Hospital Stay (HOSPITAL_COMMUNITY)
Admission: RE | Admit: 2017-10-17 | Discharge: 2017-10-17 | Disposition: A | Discharge status: Home / Self Care | Payer: Self-pay | Source: Ambulatory Visit

## 2017-10-17 ENCOUNTER — Other Ambulatory Visit (HOSPITAL_COMMUNITY): Payer: Self-pay

## 2017-10-17 DIAGNOSIS — Z79899 Other long term (current) drug therapy: Secondary | ICD-10-CM

## 2017-10-20 ENCOUNTER — Other Ambulatory Visit (HOSPITAL_COMMUNITY): Payer: BC Managed Care – PPO

## 2017-10-20 ENCOUNTER — Encounter (HOSPITAL_COMMUNITY): Payer: Self-pay | Admitting: Psychiatry

## 2017-10-20 ENCOUNTER — Ambulatory Visit (INDEPENDENT_AMBULATORY_CARE_PROVIDER_SITE_OTHER): Payer: BC Managed Care – PPO | Admitting: Psychiatry

## 2017-10-20 DIAGNOSIS — Z818 Family history of other mental and behavioral disorders: Secondary | ICD-10-CM

## 2017-10-20 DIAGNOSIS — R450 Nervousness: Secondary | ICD-10-CM

## 2017-10-20 DIAGNOSIS — F3162 Bipolar disorder, current episode mixed, moderate: Secondary | ICD-10-CM

## 2017-10-20 DIAGNOSIS — F419 Anxiety disorder, unspecified: Secondary | ICD-10-CM

## 2017-10-20 LAB — LITHIUM LEVEL: Lithium Lvl: 0.7 mmol/L (ref 0.6–1.2)

## 2017-10-20 MED ORDER — LAMOTRIGINE 150 MG PO TABS: 300.0000 mg | ORAL_TABLET | Freq: Every day | ORAL | 0 refills | Status: DC

## 2017-10-20 MED ORDER — LITHIUM CARBONATE ER 300 MG PO TBCR: EXTENDED_RELEASE_TABLET | ORAL | 0 refills | Status: DC

## 2017-10-20 MED ORDER — BREXPIPRAZOLE 2 MG PO TABS: 2.0000 mg | ORAL_TABLET | Freq: Every day | ORAL | 0 refills | Status: DC

## 2017-10-20 NOTE — Progress Notes (Signed)
BH MD/PA/NP OP Progress Note  10/20/2017 8:47 AM Christine [name]  MRN:  [id number]  Chief Complaint: I am doing better.  I finished the program.  HPI: Christine [name] came for her follow-up appointment.  She recently finished PHP and then intensive outpatient program.  There were no changes in her medication in the program.  She is taking [name] 2 mg which is helping her mood, paranoia, irritability and mania.  We increased lithium and now she is taking 600 mg twice a day.  Her last lithium level was 0.6.  She is not taking Klonopin every day because her anxiety is under control.  She started seeing therapist Christine [name] for counseling.  Since she got blood transfusion her energy level is improved.  Her last hemoglobin was 10.6.  She is scheduled to have more iron transfusion in coming weeks.  She denies any suicidal thoughts or homicidal thought.  She has no tremors, shakes or any EPS.  She lives with her husband and 2 adopted children.  Her husband is very supportive.  Patient has no rash, itching, tremors or shakes.  Her sleep is improved.  She feels some time anxious and nervous but denies any feeling of hopelessness or worthlessness.  She denies any aggression or any mania at this time.  Visit Diagnosis:    ICD-10-CM   1. Bipolar disorder, current episode mixed, moderate (HCC) F31.62 lamoTRIgine (LAMICTAL) 150 MG tablet    Brexpiprazole ([name]) 2 MG TABS    lithium carbonate (LITHOBID) 300 MG CR tablet    Past Psychiatric History: Reviewed. Patient had a history of depression since age 61.  She took antidepressant but remember making her manic.  She has seen Dr. Tomasita Crumble [name]'s office in the past and tried tried numerous medication including Risperdal, Seroquel, Abilify, lithium, Prozac, Paxil, Zoloft, Lexapro, Celexa, Effexor and Klonopin.    Patient has history of mania and impulsive behavior.  We have tried [name].  She has 3 psychiatric hospitalization.   She also done PHP and intensive  outpatient program.  She denies any history of suicidal attempt.  Her last hospitalization was in November 2017.     Past Medical History:  Past Medical History:  Diagnosis Date  . Anemia   . Anxiety   . Bipolar 1 disorder ([place])   . Depression   . Fibroids   . High blood pressure   . Mental disorder   . PCOS (polycystic ovarian syndrome)     Past Surgical History:  Procedure Laterality Date  . DIAGNOSTIC LAPAROSCOPY    . DILATION AND CURETTAGE OF UTERUS    . HYSTEROSCOPY W/D&C  07/22/2011   Procedure: DILATATION AND CURETTAGE /HYSTEROSCOPY;  Surgeon: Christine Mourning, MD;  Location: [facility];  Service: Gynecology;;    Family Psychiatric History: Reviewed.  Family History:  Family History  Problem Relation Age of Onset  . Anxiety disorder Mother   . Depression Mother     Social History:  Social History   Socioeconomic History  . Marital status: Married    Spouse name: None  . Number of children: None  . Years of education: None  . Highest education level: None  Social Needs  . Financial resource strain: Somewhat hard  . Food insecurity - worry: Sometimes true  . Food insecurity - inability: Sometimes true  . Transportation needs - medical: No  . Transportation needs - non-medical: No  Occupational History  . None  Tobacco Use  . Smoking status: Never Smoker  . Smokeless tobacco:  Never Used  Substance and Sexual Activity  . Alcohol use: No    Comment: rare  . Drug use: No  . Sexual activity: Yes    Partners: Male    Birth control/protection: None  Other Topics Concern  . None  Social History Narrative  . None    Allergies:  Allergies  Allergen Reactions  . Penicillins Rash    Has patient had a PCN reaction causing immediate rash, facial/tongue/throat swelling, SOB or lightheadedness with hypotension: Yes Has patient had a PCN reaction causing severe rash involving mucus membranes or skin necrosis: Unknown Has patient had a PCN reaction that required  hospitalization: No Has patient had a PCN reaction occurring within the last 10 years: No If all of the above answers are "NO", then may proceed with Cephalosporin use.     Metabolic Disorder Labs: Recent Results (from the past 2160 hour(s))  Lithium level     Status: Abnormal   Collection Time: 08/26/17 10:51 AM  Result Value Ref Range   Lithium Lvl 0.5 (L) 0.6 - 1.2 mmol/L    Comment:                                  Detection Limit = 0.1                           <0.1 indicates None Detected   Urinalysis, Routine w reflex microscopic     Status: Abnormal   Collection Time: 08/28/17  1:19 PM  Result Value Ref Range   Color, Urine STRAW (A) YELLOW   APPearance CLEAR CLEAR   Specific Gravity, Urine 1.006 1.005 - 1.030   pH 8.0 5.0 - 8.0   Glucose, UA NEGATIVE NEGATIVE mg/dL   Hgb urine dipstick NEGATIVE NEGATIVE   Bilirubin Urine NEGATIVE NEGATIVE   Ketones, ur NEGATIVE NEGATIVE mg/dL   Protein, ur NEGATIVE NEGATIVE mg/dL   Nitrite NEGATIVE NEGATIVE   Leukocytes, UA NEGATIVE NEGATIVE  Urine culture     Status: Abnormal   Collection Time: 08/28/17  1:19 PM  Result Value Ref Range   Specimen Description URINE, RANDOM    Special Requests NONE    Culture MULTIPLE SPECIES PRESENT, SUGGEST RECOLLECTION (A)    Report Status 08/30/2017 FINAL   Type and screen [place]     Status: None   Collection Time: 08/28/17  1:35 PM  Result Value Ref Range   ABO/RH(D) O POS    Antibody Screen NEG    Sample Expiration 08/31/2017    Unit Number [id number]    Blood Component Type RED CELLS,LR    Unit division 00    Status of Unit ISSUED,FINAL    Transfusion Status OK TO TRANSFUSE    Crossmatch Result Compatible    Unit Number [id number]    Blood Component Type RED CELLS,LR    Unit division 00    Status of Unit ISSUED,FINAL    Transfusion Status OK TO TRANSFUSE    Crossmatch Result Compatible    Unit Number [id number]    Blood Component Type RED CELLS,LR     Unit division 00    Status of Unit ISSUED,FINAL    Transfusion Status OK TO TRANSFUSE    Crossmatch Result Compatible   ABO/Rh     Status: None   Collection Time: 08/28/17  1:35 PM  Result Value Ref Range   ABO/RH(D) Jenetta Downer  POS   BPAM RBC     Status: None   Collection Time: 08/28/17  1:35 PM  Result Value Ref Range   ISSUE DATE / TIME 827078675449    Blood Product Unit Number [id number]    PRODUCT CODE E0336V00    Unit Type and Rh 5100    Blood Product Expiration Date 832549826415    ISSUE DATE / TIME 830940768088    Blood Product Unit Number [id number]    PRODUCT CODE E0336V00    Unit Type and Rh 5100    Blood Product Expiration Date 924462863817    ISSUE DATE / TIME [id number]    Blood Product Unit Number [id number]    PRODUCT CODE E0401V00    Unit Type and Rh 5100    Blood Product Expiration Date 045997741423   CBC with Differential     Status: Abnormal   Collection Time: 08/28/17  1:36 PM  Result Value Ref Range   WBC 9.6 4.0 - 10.5 K/uL   RBC 3.53 (L) 3.87 - 5.11 MIL/uL   Hemoglobin 6.6 (LL) 12.0 - 15.0 g/dL    Comment: REPEATED TO VERIFY CRITICAL RESULT CALLED TO, READ BACK BY AND VERIFIED WITH: STANHOPE,M @ 1459 ON 012419 BY POTEAT,S    HCT 23.4 (L) 36.0 - 46.0 %   MCV 66.3 (L) 78.0 - 100.0 fL   MCH 18.7 (L) 26.0 - 34.0 pg   MCHC 28.2 (L) 30.0 - 36.0 g/dL   RDW 20.2 (H) 11.5 - 15.5 %   Platelets 401 (H) 150 - 400 K/uL   Neutrophils Relative % 76 %   Lymphocytes Relative 15 %   Monocytes Relative 8 %   Eosinophils Relative 1 %   Basophils Relative 0 %   Neutro Abs 7.3 1.7 - 7.7 K/uL   Lymphs Abs 1.4 0.7 - 4.0 K/uL   Monocytes Absolute 0.8 0.1 - 1.0 K/uL   Eosinophils Absolute 0.1 0.0 - 0.7 K/uL   Basophils Absolute 0.0 0.0 - 0.1 K/uL   RBC Morphology POLYCHROMASIA PRESENT     Comment: TARGET CELLS Spherocytes present   Comprehensive metabolic panel     Status: Abnormal   Collection Time: 08/28/17  1:36 PM  Result Value Ref Range   Sodium 138  135 - 145 mmol/L   Potassium 3.8 3.5 - 5.1 mmol/L   Chloride 105 101 - 111 mmol/L   CO2 24 22 - 32 mmol/L   Glucose, Bld 89 65 - 99 mg/dL   BUN 9 6 - 20 mg/dL   Creatinine, Ser 0.79 0.44 - 1.00 mg/dL   Calcium 9.1 8.9 - 10.3 mg/dL   Total Protein 7.4 6.5 - 8.1 g/dL   Albumin 4.1 3.5 - 5.0 g/dL   AST 14 (L) 15 - 41 U/L   ALT 16 14 - 54 U/L   Alkaline Phosphatase 63 38 - 126 U/L   Total Bilirubin 0.3 0.3 - 1.2 mg/dL   GFR calc non Af Amer >60 >60 mL/min   GFR calc Af Amer >60 >60 mL/min    Comment: (NOTE) The eGFR has been calculated using the CKD EPI equation. This calculation has not been validated in all clinical situations. eGFR's persistently <60 mL/min signify possible Chronic Kidney Disease.    Anion gap 9 5 - 15  I-Stat Beta hCG blood, ED ([facility], [facility], [facility] only)     Status: None   Collection Time: 08/28/17  1:50 PM  Result Value Ref Range   I-stat hCG, quantitative <5.0 <5 mIU/mL  Comment 3            Comment:   GEST. AGE      CONC.  (mIU/mL)   <=1 WEEK        5 - 50     2 WEEKS       50 - 500     3 WEEKS       100 - 10,000     4 WEEKS     1,000 - 30,000        FEMALE AND NON-PREGNANT FEMALE:     LESS THAN 5 mIU/mL   Vitamin B12     Status: None   Collection Time: 08/28/17  2:22 PM  Result Value Ref Range   Vitamin B-12 372 180 - 914 pg/mL    Comment: (NOTE) This assay is not validated for testing neonatal or myeloproliferative syndrome specimens for Vitamin B12 levels. Performed at [facility], [place] [address] [place], [place] [address]   Folate     Status: None   Collection Time: 08/28/17  2:22 PM  Result Value Ref Range   Folate 16.5 >5.9 ng/mL    Comment: Performed at [facility], [place] [address], [place], [place] [address]  Iron and TIBC     Status: Abnormal   Collection Time: 08/28/17  2:22 PM  Result Value Ref Range   Iron 28 28 - 170 ug/dL   TIBC 428 250 - 450 ug/dL   Saturation Ratios 7 (L) 10.4 - 31.8 %   UIBC 400 ug/dL    Comment:  Performed at [facility], [place] [address], [place], [place] [address]  Ferritin     Status: Abnormal   Collection Time: 08/28/17  2:22 PM  Result Value Ref Range   Ferritin 2 (L) 11 - 307 ng/mL    Comment: Performed at [facility], [place] [address], [place], [place] [address]  Reticulocytes     Status: Abnormal   Collection Time: 08/28/17  2:22 PM  Result Value Ref Range   Retic Ct Pct 2.9 0.4 - 3.1 %   RBC. 3.39 (L) 3.87 - 5.11 MIL/uL   Retic Count, Absolute 98.3 19.0 - 186.0 K/uL  Prepare RBC     Status: None   Collection Time: 08/28/17  4:00 PM  Result Value Ref Range   Order Confirmation ORDER PROCESSED BY BLOOD BANK   Influenza panel by PCR (type A & B)     Status: None   Collection Time: 08/28/17  5:14 PM  Result Value Ref Range   Influenza A By PCR NEGATIVE NEGATIVE   Influenza B By PCR NEGATIVE NEGATIVE    Comment: (NOTE) The Xpert Xpress Flu assay is intended as an aid in the diagnosis of  influenza and should not be used as a sole basis for treatment.  This  assay is FDA approved for nasopharyngeal swab specimens only. Nasal  washings and aspirates are unacceptable for Xpert Xpress Flu testing.   Prepare RBC     Status: None   Collection Time: 08/28/17  6:01 PM  Result Value Ref Range   Order Confirmation ORDER PROCESSED BY BLOOD BANK   Magnesium     Status: None   Collection Time: 08/28/17  8:32 PM  Result Value Ref Range   Magnesium 1.9 1.7 - 2.4 mg/dL  Troponin I     Status: None   Collection Time: 08/28/17  8:32 PM  Result Value Ref Range  Troponin I <0.03 <0.03 ng/mL  HIV antibody (Routine Testing)     Status: None   Collection Time: 08/29/17  2:56 AM  Result Value Ref Range   HIV Screen 4th Generation wRfx Non Reactive Non Reactive    Comment: (NOTE) Performed At: [facility] [place], [place] [id number] Rush Farmer MD [id number]   Basic metabolic panel     Status: Abnormal   Collection Time: 08/29/17  2:56 AM   Result Value Ref Range   Sodium 136 135 - 145 mmol/L   Potassium 3.8 3.5 - 5.1 mmol/L   Chloride 104 101 - 111 mmol/L   CO2 25 22 - 32 mmol/L   Glucose, Bld 105 (H) 65 - 99 mg/dL   BUN 8 6 - 20 mg/dL   Creatinine, Ser 0.94 0.44 - 1.00 mg/dL   Calcium 8.6 (L) 8.9 - 10.3 mg/dL   GFR calc non Af Amer >60 >60 mL/min   GFR calc Af Amer >60 >60 mL/min    Comment: (NOTE) The eGFR has been calculated using the CKD EPI equation. This calculation has not been validated in all clinical situations. eGFR's persistently <60 mL/min signify possible Chronic Kidney Disease.    Anion gap 7 5 - 15  CBC     Status: Abnormal   Collection Time: 08/29/17  2:56 AM  Result Value Ref Range   WBC 7.7 4.0 - 10.5 K/uL   RBC 3.78 (L) 3.87 - 5.11 MIL/uL   Hemoglobin 7.9 (L) 12.0 - 15.0 g/dL   HCT 26.5 (L) 36.0 - 46.0 %   MCV 70.1 (L) 78.0 - 100.0 fL   MCH 20.9 (L) 26.0 - 34.0 pg   MCHC 29.8 (L) 30.0 - 36.0 g/dL   RDW 22.5 (H) 11.5 - 15.5 %   Platelets 368 150 - 400 K/uL  Troponin I (q 6hr x 3)     Status: None   Collection Time: 08/29/17  2:56 AM  Result Value Ref Range   Troponin I <0.03 <0.03 ng/mL  Prepare RBC     Status: None   Collection Time: 08/29/17  9:16 AM  Result Value Ref Range   Order Confirmation ORDER PROCESSED BY BLOOD BANK   Troponin I (q 6hr x 3)     Status: None   Collection Time: 08/29/17  9:25 AM  Result Value Ref Range   Troponin I <0.03 <0.03 ng/mL  ECHOCARDIOGRAM COMPLETE     Status: Abnormal   Collection Time: 08/29/17  3:04 PM  Result Value Ref Range   Weight 5,372.17 oz   Height 65 in   BP 154/81 mmHg   LV PW d 11 (A) 0.6 - 1.1 mm   FS 42 28 - 44 %   LA vol 57.2 mL   Ao-asc 36 cm   LA ID, A-P, ES 46 mm   IVS/LV PW RATIO, ED 1.27    LV e' LATERAL 13.8 cm/s   LA diam index 1.68 cm/m2   LA vol A4C 54.3 ml   E decel time 225 msec   LVOT diameter 22 mm   LVOT area 3.80 cm2   MV pk E vel 1.5 m/s   LA vol index 20.9 mL/m2   MV Dec 225    LA diam end sys 46.00 mm    TDI e' medial 14.10    TDI e' lateral 13.80    Lateral S' vel 25.40 cm/sec   TAPSE 21.40 mm  Hemoglobin and hematocrit, blood  Status: Abnormal   Collection Time: 08/29/17  5:13 PM  Result Value Ref Range   Hemoglobin 9.0 (L) 12.0 - 15.0 g/dL   HCT 30.6 (L) 36.0 - 46.0 %  Haptoglobin     Status: None   Collection Time: 09/16/17  1:14 PM  Result Value Ref Range   Haptoglobin 141 34 - 200 mg/dL    Comment: (NOTE) Performed At: [facility] [place], [place] [id number] Rush Farmer MD [id number] Performed at [facility], [place] [address], [place], [place] [address]   Methylmalonic acid, serum     Status: None   Collection Time: 09/16/17  1:14 PM  Result Value Ref Range   Methylmalonic Acid, Quantitative 87 0 - 378 nmol/L   Disclaimer: Comment     Comment: (NOTE) This test was developed and its performance characteristics determined by [facility]. It has not been cleared or approved by the Food and Drug Administration. Performed At: [facility] [place], [place] [id number] Rush Farmer MD [id number] Performed at [facility], [place] [address], [place], [place] [address]   Lactate dehydrogenase (LDH)     Status: Abnormal   Collection Time: 09/16/17  1:14 PM  Result Value Ref Range   LDH 251 (H) 125 - 245 U/L    Comment: Performed at [facility], [place] [address] [place], [place] [address]  CMP ([place] only)     Status: Abnormal   Collection Time: 09/16/17  1:14 PM  Result Value Ref Range   Sodium 138 136 - 145 mmol/L   Potassium 3.8 3.5 - 5.1 mmol/L   Chloride 108 98 - 109 mmol/L   CO2 21 (L) 22 - 29 mmol/L   Glucose, Bld 87 70 - 140 mg/dL   BUN 8 7 - 26 mg/dL   Creatinine 0.87 0.60 - 1.10 mg/dL   Calcium 9.4 8.4 - 10.4 mg/dL   Total Protein 7.4 6.4 - 8.3 g/dL   Albumin 4.0 3.5 - 5.0 g/dL   AST 10 5 - 34 U/L   ALT 12 0 - 55 U/L    Alkaline Phosphatase 68 40 - 150 U/L   Total Bilirubin 0.4 0.2 - 1.2 mg/dL   GFR, Est Non Af Am >60 >60 mL/min   GFR, Est AFR Am >60 >60 mL/min    Comment: (NOTE) The eGFR has been calculated using the CKD EPI equation. This calculation has not been validated in all clinical situations. eGFR's persistently <60 mL/min signify possible Chronic Kidney Disease.    Anion gap 9 3 - 11    Comment: Performed at [facility], [address] [place], [place] [address]  CBC with Differential ([place] Only)     Status: Abnormal   Collection Time: 09/16/17  1:14 PM  Result Value Ref Range   WBC Count 10.4 (H) 3.9 - 10.3 K/uL   RBC 4.52 3.70 - 5.45 MIL/uL   Hemoglobin 10.5 (L) 11.6 - 15.9 g/dL   HCT 33.6 (L) 34.8 - 46.6 %   MCV 74.3 (L) 79.5 - 101.0 fL   MCH 23.2 (L) 25.1 - 34.0 pg   MCHC 31.1 (L) 31.5 - 36.0 g/dL   RDW 32.6 (H) 11.2 - 14.5 %   Platelet Count 341 145 - 400 K/uL   Neutrophils Relative % 77 %   Neutro Abs 8.0 (H) 1.5 - 6.5 K/uL   Lymphocytes Relative 16 %   Lymphs Abs  1.7 0.9 - 3.3 K/uL   Monocytes Relative 6 %   Monocytes Absolute 0.6 0.1 - 0.9 K/uL   Eosinophils Relative 1 %   Eosinophils Absolute 0.1 0.0 - 0.5 K/uL   Basophils Relative 0 %   Basophils Absolute 0.0 0.0 - 0.1 K/uL    Comment: Performed at [facility], [place] [address] [place], [place] [address]  Direct antiglobulin test ([place])     Status: None   Collection Time: 09/16/17  1:15 PM  Result Value Ref Range   DAT, complement NEG    DAT, IgG      NEG Performed at [place] [address] [place], [place] [address]   Vitamin B12     Status: None   Collection Time: 09/16/17  1:16 PM  Result Value Ref Range   Vitamin B-12 294 180 - 914 pg/mL    Comment: (NOTE) This assay is not validated for testing neonatal or myeloproliferative syndrome specimens for Vitamin B12 levels. Performed at [name], [place] [address] [place], [place] [address]   Ferritin     Status: None   Collection Time: 09/16/17  1:16 PM  Result Value Ref Range   Ferritin 27 9 - 269 ng/mL    Comment: Performed at [facility], [place] [address] [place], [place] [address]  Iron and TIBC     Status: Abnormal   Collection Time: 09/16/17  1:16 PM  Result Value Ref Range   Iron 30 (L) 41 - 142 ug/dL   TIBC 349 236 - 444 ug/dL   Saturation Ratios 9 (L) 21 - 57 %   UIBC 319 ug/dL    Comment: Performed at [facility], [place] [address], [place], [place] [address]  Lithium level     Status: None   Collection Time: 10/10/17  9:00 AM  Result Value Ref Range   Lithium Lvl 0.6 0.6 - 1.2 mmol/L    Comment:                                  Detection Limit = 0.1                           <0.1 indicates None Detected   CBC with Differential ([facility] Only)     Status: Abnormal   Collection Time: 10/13/17  7:40 AM  Result Value Ref Range   WBC Count 9.4 3.9 - 10.3 K/uL   RBC 4.03 3.70 - 5.45 MIL/uL   Hemoglobin 10.6 (L) 11.6 - 15.9 g/dL   HCT 34.8 34.8 - 46.6 %   MCV 86.4 79.5 - 101.0 fL   MCH 26.3 25.1 - 34.0 pg   MCHC 30.5 (L) 31.5 - 36.0 g/dL   RDW 27.6 (H) 11.2 - 14.5 %   Platelet Count 291 145 - 400 K/uL   Neutrophils Relative % 73 %   Neutro Abs 6.9 (H) 1.5 - 6.5 K/uL   Lymphocytes Relative 20 %   Lymphs Abs 1.8 0.9 - 3.3 K/uL   Monocytes Relative 6 %   Monocytes Absolute 0.5 0.1 - 0.9 K/uL   Eosinophils Relative 1 %   Eosinophils Absolute 0.1 0.0 - 0.5 K/uL   Basophils Relative 0 %   Basophils Absolute 0.0 0.0 - 0.1 K/uL    Comment: Performed at [facility], [place] [address] [place], [place] [address]  CMP ([place] only)     Status: None   Collection Time: 10/13/17  7:40 AM  Result Value Ref Range   Sodium 140 136 - 145 mmol/L   Potassium 4.1 3.5 - 5.1 mmol/L   Chloride 107 98 - 109 mmol/L   CO2 25 22 - 29 mmol/L   Glucose, Bld 135 70 -  140 mg/dL   BUN 8 7 - 26 mg/dL   Creatinine 0.87 0.60 - 1.10 mg/dL   Calcium 9.5 8.4 - 10.4 mg/dL   Total Protein 7.0 6.4 - 8.3 g/dL   Albumin 3.7 3.5 - 5.0 g/dL   AST 9 5 - 34 U/L   ALT 15 0 - 55 U/L   Alkaline Phosphatase 66 40 - 150 U/L   Total Bilirubin 0.4 0.2 - 1.2 mg/dL   GFR, Est Non Af Am >60 >60 mL/min   GFR, Est AFR Am >60 >60 mL/min    Comment: (NOTE) The eGFR has been calculated using the CKD EPI equation. This calculation has not been validated in all clinical situations. eGFR's persistently <60 mL/min signify possible Chronic Kidney Disease.    Anion gap 8 3 - 11    Comment: Performed at [facility], [address] [place], [place] [address]  Ferritin     Status: None   Collection Time: 10/13/17  7:41 AM  Result Value Ref Range   Ferritin 44 9 - 269 ng/mL    Comment: Performed at [facility], [place] [address] [place], [place] [address]  Iron and TIBC     Status: Abnormal   Collection Time: 10/13/17  7:41 AM  Result Value Ref Range   Iron 41 41 - 142 ug/dL   TIBC 296 236 - 444 ug/dL   Saturation Ratios 14 (L) 21 - 57 %   UIBC 256 ug/dL    Comment: Performed at [facility], [place] [address], [place], [place] [address]   No results found for: HGBA1C, MPG No results found for: PROLACTIN No results found for: CHOL, TRIG, HDL, CHOLHDL, VLDL, LDLCALC No results found for: TSH  Therapeutic Level Labs: Lab Results  Component Value Date   LITHIUM 0.6 10/10/2017   LITHIUM 0.5 (L) 08/26/2017   No results found for: VALPROATE No components found for:  CBMZ  Current Medications: Current Outpatient Medications  Medication Sig Dispense Refill  . amLODipine (NORVASC) 10 MG tablet Take 1 tablet (10 mg total) by mouth daily. 30 tablet 0  . Brexpiprazole ([name]) 2 MG TABS Take 2 mg by mouth daily. 30 tablet 0  . clonazePAM (KLONOPIN) 0.5 MG tablet Take 1 tablet (0.5 mg total) by mouth 2 (two)  times daily as needed. 30 tablet 0  . fluticasone (FLONASE) 50 MCG/ACT nasal spray SHAKE LQ AND U 2 SPRAYS IEN QD  4  . ibuprofen (ADVIL,MOTRIN) 200 MG tablet Take 400 mg by mouth 2 (two) times daily as needed for headache.    . lamoTRIgine (LAMICTAL) 150 MG tablet Take 2 tablets (300 mg total) by mouth daily. 60 tablet 0  . lithium carbonate (LITHOBID) 300 MG CR tablet Take 2 tablets (600 mg total) by mouth 2 (two) times daily. 120 tablet 0  . meloxicam (MOBIC) 7.5 MG tablet Take 7.5 mg by mouth 2 (two) times daily.  2   No current facility-administered medications for this visit.      Musculoskeletal: Strength &  Muscle Tone: within normal limits Gait & Station: normal Patient leans: N/A  Psychiatric Specialty Exam: Review of Systems  Constitutional: Negative.   HENT: Negative.   Skin: Negative.  Negative for itching and rash.  Neurological: Negative.   Psychiatric/Behavioral: The patient is nervous/anxious.     Blood pressure (!) 143/79, pulse 92, height '5\' 3"'$  (1.6 m), weight (!) 340 lb (154.2 kg), SpO2 95 %.Body mass index is 60.23 kg/m.  General Appearance: Casual and Obese  Eye Contact:  Fair  Speech:  Clear and Coherent  Volume:  Normal  Mood:  Anxious  Affect:  Appropriate  Thought Process:  Goal Directed  Orientation:  Full (Time, Place, and Person)  Thought Content: Rumination   Suicidal Thoughts:  No  Homicidal Thoughts:  No  Memory:  Immediate;   Good Recent;   Good Remote;   Good  Judgement:  Good  Insight:  Good  Psychomotor Activity:  Normal  Concentration:  Concentration: Fair and Attention Span: Fair  Recall:  Good  Fund of Knowledge: Good  Language: Good  Akathisia:  No  Handed:  Right  AIMS (if indicated): not done  Assets:  Communication Skills Desire for Improvement Housing Resilience Social Support  ADL's:  Intact  Cognition: WNL  Sleep:  Improved   Screenings: GAD-7     Counselor from 09/09/2017 in [place] Counselor from 04/04/2017 in [place] Counselor from 03/18/2017 in [place]  Total GAD-7 Score  '16  7  18    '$ PHQ2-9     Counselor from 09/26/2017 in [place] Counselor from 09/19/2017 in [place] Counselor from 09/09/2017 in [place] Counselor from 04/04/2017 in [place] Counselor from 03/28/2017 in [place]  PHQ-2 Total Score  '3  4  6  2  4  '$ PHQ-9 Total Score  '12  16  21  10  15       '$ Assessment and Plan: Bipolar disorder type I.  Anxiety disorder NOS.  I review notes from [facility] and intensive outpatient program.  I also reviewed blood work results.  Patient doing better since the [place] increased to 2 mg.  She still feel anxious and nervous.  Her last lithium level was 0.6.  She has no rash, itching tremors shakes or any EPS.  Recommended to try lithium 1500 mg a day.  Continue [name] 2 mg daily and Lamictal 150 mg twice a day.  She does not need a new prescription of Klonopin at this time.  Patient is scheduled to have iron transfusion in coming weeks.  We will consider repeating lithium level on her next appointment.  Follow-up in 4 weeks.  Discussed medication side effects and benefits.  Discussed safety concerns at any time having active suicidal thoughts or homicidal thought and she need to call 911 or go to local emergency room.   time spent 25 minutes.  More than 50% of the time spent in psychoeducation, counseling, coordination of care and reviewing her records and answers the question that the patient have.   Kathlee Nations, MD 10/20/2017, 8:47 AM

## 2017-10-21 ENCOUNTER — Other Ambulatory Visit (HOSPITAL_COMMUNITY): Payer: BC Managed Care – PPO

## 2017-10-21 ENCOUNTER — Ambulatory Visit (HOSPITAL_COMMUNITY): Payer: Self-pay | Admitting: Licensed Clinical Social Worker

## 2017-10-22 ENCOUNTER — Other Ambulatory Visit (HOSPITAL_COMMUNITY): Payer: BC Managed Care – PPO

## 2017-10-23 ENCOUNTER — Other Ambulatory Visit (HOSPITAL_COMMUNITY): Payer: BC Managed Care – PPO

## 2017-10-23 ENCOUNTER — Ambulatory Visit (HOSPITAL_COMMUNITY): Payer: Self-pay | Admitting: Licensed Clinical Social Worker

## 2017-10-23 ENCOUNTER — Encounter: Payer: Self-pay | Admitting: Hematology and Oncology

## 2017-10-23 NOTE — Assessment & Plan Note (Addendum)
42 y.o. female with menorrhagia with regular cycle likely attributable to presence of uterine fibroids and new discovery of uterine polyp associated with severe microcytic hypochromic anemia and profound iron depletion.  Patient received a single dose of Feraheme with excellent improvement in both hemoglobin and iron stores.  Hemoglobin presently is 10.6 and likely sufficient to proceed with the planned surgical procedures.  Plan: --Today IV Feraheme x1  -Return to clinic in 2 months with labs obtained 2-3 days prior for continued monitoring of iron replacement deficiency.  Possible additional parenteral iron administration at that time.

## 2017-10-23 NOTE — Progress Notes (Signed)
[facility] Follow-up Visit:  Assessment: Iron deficiency anemia due to chronic blood loss 42 y.o. female with menorrhagia with regular cycle likely attributable to presence of uterine fibroids and new discovery of uterine polyp associated with severe microcytic hypochromic anemia and profound iron depletion.  Patient received a single dose of Feraheme with excellent improvement in both hemoglobin and iron stores.  Hemoglobin presently is 10.6 and likely sufficient to proceed with the planned surgical procedures.  Plan: --Today IV Feraheme x1  -Return to clinic in 2 months with labs obtained 2-3 days prior for continued monitoring of iron replacement deficiency.  Possible additional parenteral iron administration at that time.   Voice recognition software was used and creation of this note. Despite my best effort at editing the text, some misspelling/errors may have occurred.  Orders Placed This Encounter  Procedures  . CBC with Differential (Cancer Center Only)    Standing Status:   Future    Standing Expiration Date:   10/16/2018  . CMP ([facility] only)    Standing Status:   Future    Standing Expiration Date:   10/16/2018  . Iron and TIBC    Standing Status:   Future    Standing Expiration Date:   10/16/2018  . Ferritin    Standing Status:   Future    Standing Expiration Date:   10/16/2018    Cancer Staging No matching staging information was found for the patient.  All questions were answered.  . The patient knows to call the clinic with any problems, questions or concerns.  This note was electronically signed.    History of Presenting Illness Christine [name] is a 42 y.o. female followed in the [facility] for iron deficiency anemia due to recurrent blood loss, referred by Maude Leriche, PA-C.  Patient reports heavy menstrual periods for an extended period of time.  Her cycles usually last every 25 days with 7 days of bleeding with for 5 days of  heavy bleeding requiring 4-5 maxipads per day.  Prior to most recent admission in January, patient had a 10-day long period of bleeding.  Was admitted to the hospital on 08/08/17 received transfusion of 3 units of packed red blood cells as well as an IV iron infusion with Feraheme.  Following our last visit to the clinic, patient underwent a single infusion of Feraheme on 29/02/11 complicated by feeling of warmth, tingling, and headache.  Patient received Pepcid with resolution of symptoms.  In addition, patient is now scheduled to undergo gynecological surgery with D&C, hysteroscopy, and possible placement of IUD after finding of uterine polyp.  Procedure scheduled for 11/27/17.  Oncological/hematological History: --Labs, 09/18/16: Hgb   7.1, MCV 69.0, MCH 18.3, MCHC     ..., RDW 26.7, Plt 408; TSH 0.58 --Labs, 08/29/17: Hgb   7.9, MCV 70.1, MCH 20.9, MCHC 29.8, RDW 22.5, Plt 368; Fe 28, FeSat   7%, TIBC 428, Ferritin   2, Folate 16.5, Vit B12 372 --Treatment:  --Feraheme 525m IV x1, 09/24/17 --Labs, 10/13/17: Hgb 10.6, MCV 86.4, MCH 26.3, MCHC 30.5, RDW 27.6, Plt 291; Fe 41, FeSat 14%, TIBC 296, Ferritin 44;   No history exists.    Medical History: Past Medical History:  Diagnosis Date  . Anemia   . Anxiety   . Bipolar 1 disorder ([place]   . Depression   . Fibroids   . High blood pressure   . Mental disorder   . PCOS (polycystic ovarian syndrome)  Surgical History: Past Surgical History:  Procedure Laterality Date  . DIAGNOSTIC LAPAROSCOPY    . DILATION AND CURETTAGE OF UTERUS    . HYSTEROSCOPY W/D&C  07/22/2011   Procedure: DILATATION AND CURETTAGE /HYSTEROSCOPY;  Surgeon: Cyril Mourning, MD;  Location: [facility];  Service: Gynecology;;    Family History: Family History  Problem Relation Age of Onset  . Anxiety disorder Mother   . Depression Mother     Social History: Social History   Socioeconomic History  . Marital status: Married    Spouse name: Not on file   . Number of children: Not on file  . Years of education: Not on file  . Highest education level: Not on file  Occupational History  . Not on file  Social Needs  . Financial resource strain: Somewhat hard  . Food insecurity:    Worry: Sometimes true    Inability: Sometimes true  . Transportation needs:    Medical: No    Non-medical: No  Tobacco Use  . Smoking status: Never Smoker  . Smokeless tobacco: Never Used  Substance and Sexual Activity  . Alcohol use: No    Comment: rare  . Drug use: No  . Sexual activity: Yes    Partners: Male    Birth control/protection: None  Lifestyle  . Physical activity:    Days per week: 0 days    Minutes per session: Not on file  . Stress: Rather much  Relationships  . Social connections:    Talks on phone: Never    Gets together: Never    Attends religious service: Never    Active member of club or organization: No    Attends meetings of clubs or organizations: Never    Relationship status: Not on file  . Intimate partner violence:    Fear of current or ex partner: No    Emotionally abused: No    Physically abused: No    Forced sexual activity: No  Other Topics Concern  . Not on file  Social History Narrative  . Not on file    Allergies: Allergies  Allergen Reactions  . Penicillins Rash    Has patient had a PCN reaction causing immediate rash, facial/tongue/throat swelling, SOB or lightheadedness with hypotension: Yes Has patient had a PCN reaction causing severe rash involving mucus membranes or skin necrosis: Unknown Has patient had a PCN reaction that required hospitalization: No Has patient had a PCN reaction occurring within the last 10 years: No If all of the above answers are "NO", then may proceed with Cephalosporin use.     Medications:  Current Outpatient Medications  Medication Sig Dispense Refill  . amLODipine (NORVASC) 10 MG tablet Take 1 tablet (10 mg total) by mouth daily. 30 tablet 0  . Brexpiprazole  ([name]) 2 MG TABS Take 2 mg by mouth daily. 30 tablet 0  . clonazePAM (KLONOPIN) 0.5 MG tablet Take 1 tablet (0.5 mg total) by mouth 2 (two) times daily as needed. 30 tablet 0  . fluticasone (FLONASE) 50 MCG/ACT nasal spray SHAKE LQ AND U 2 SPRAYS IEN QD  4  . ibuprofen (ADVIL,MOTRIN) 200 MG tablet Take 400 mg by mouth 2 (two) times daily as needed for headache.    . lamoTRIgine (LAMICTAL) 150 MG tablet Take 2 tablets (300 mg total) by mouth daily. 60 tablet 0  . lithium carbonate (LITHOBID) 300 MG CR tablet Take 600 mg in am and 600 mg at pm and 300 mg at bed time 150  tablet 0  . meloxicam (MOBIC) 7.5 MG tablet Take 7.5 mg by mouth 2 (two) times daily.  2   No current facility-administered medications for this visit.     Review of Systems: Review of Systems  Genitourinary: Positive for menstrual problem.   All other systems reviewed and are negative.    PHYSICAL EXAMINATION Blood pressure (!) 147/94, pulse (!) 109, temperature 98.6 F (37 C), temperature source Oral, resp. rate 16, height 5' 3.5" (1.613 m), weight (!) 343 lb 4.8 oz (155.7 kg), SpO2 100 %.  ECOG PERFORMANCE STATUS: 1 - Symptomatic but completely ambulatory  Physical Exam  Constitutional: She is oriented to person, place, and time and well-developed, well-nourished, and in no distress. No distress.  HENT:  Head: Normocephalic and atraumatic.  Mouth/Throat: Oropharynx is clear and moist. No oropharyngeal exudate.  Eyes: Pupils are equal, round, and reactive to light. Conjunctivae and EOM are normal. No scleral icterus.  Neck: Normal range of motion. Neck supple. No thyromegaly present.  Cardiovascular: Normal rate, regular rhythm, normal heart sounds and intact distal pulses.  No murmur heard. Pulmonary/Chest: Breath sounds normal. No respiratory distress. She has no wheezes. She has no rales.  Abdominal: Soft. Bowel sounds are normal. She exhibits no distension and no mass. There is no tenderness. There is no  guarding.  Musculoskeletal: She exhibits no edema.  Lymphadenopathy:    She has no cervical adenopathy.  Neurological: She is alert and oriented to person, place, and time. She has normal reflexes. No cranial nerve deficit.  Skin: Skin is warm and dry. No rash noted. She is not diaphoretic. No erythema. There is pallor.     LABORATORY DATA: I have personally reviewed the data as listed: Appointment on 10/13/2017  Component Date Value Ref Range Status  . WBC Count 10/13/2017 9.4  3.9 - 10.3 K/uL Final  . RBC 10/13/2017 4.03  3.70 - 5.45 MIL/uL Final  . Hemoglobin 10/13/2017 10.6* 11.6 - 15.9 g/dL Final  . HCT 10/13/2017 34.8  34.8 - 46.6 % Final  . MCV 10/13/2017 86.4  79.5 - 101.0 fL Final  . MCH 10/13/2017 26.3  25.1 - 34.0 pg Final  . MCHC 10/13/2017 30.5* 31.5 - 36.0 g/dL Final  . RDW 10/13/2017 27.6* 11.2 - 14.5 % Final  . Platelet Count 10/13/2017 291  145 - 400 K/uL Final  . Neutrophils Relative % 10/13/2017 73  % Final  . Neutro Abs 10/13/2017 6.9* 1.5 - 6.5 K/uL Final  . Lymphocytes Relative 10/13/2017 20  % Final  . Lymphs Abs 10/13/2017 1.8  0.9 - 3.3 K/uL Final  . Monocytes Relative 10/13/2017 6  % Final  . Monocytes Absolute 10/13/2017 0.5  0.1 - 0.9 K/uL Final  . Eosinophils Relative 10/13/2017 1  % Final  . Eosinophils Absolute 10/13/2017 0.1  0.0 - 0.5 K/uL Final  . Basophils Relative 10/13/2017 0  % Final  . Basophils Absolute 10/13/2017 0.0  0.0 - 0.1 K/uL Final   Performed at [facility], [place] [address], [place], [place] [address]  . Sodium 10/13/2017 140  136 - 145 mmol/L Final  . Potassium 10/13/2017 4.1  3.5 - 5.1 mmol/L Final  . Chloride 10/13/2017 107  98 - 109 mmol/L Final  . CO2 10/13/2017 25  22 - 29 mmol/L Final  . Glucose, Bld 10/13/2017 135  70 - 140 mg/dL Final  . BUN 10/13/2017 8  7 - 26 mg/dL Final  . Creatinine 10/13/2017 0.87  0.60 - 1.10  mg/dL Final  . Calcium 10/13/2017 9.5  8.4 - 10.4 mg/dL Final  . Total Protein  10/13/2017 7.0  6.4 - 8.3 g/dL Final  . Albumin 10/13/2017 3.7  3.5 - 5.0 g/dL Final  . AST 10/13/2017 9  5 - 34 U/L Final  . ALT 10/13/2017 15  0 - 55 U/L Final  . Alkaline Phosphatase 10/13/2017 66  40 - 150 U/L Final  . Total Bilirubin 10/13/2017 0.4  0.2 - 1.2 mg/dL Final  . GFR, Est Non Af Am 10/13/2017 >60  >60 mL/min Final  . GFR, Est AFR Am 10/13/2017 >60  >60 mL/min Final   Comment: (NOTE) The eGFR has been calculated using the CKD EPI equation. This calculation has not been validated in all clinical situations. eGFR's persistently <60 mL/min signify possible Chronic Kidney Disease.   Georgiann Hahn gap 10/13/2017 8  3 - 11 Final   Performed at [facility], [place] [address], [place], [place] [address]  . Ferritin 10/13/2017 44  9 - 269 ng/mL Final   Performed at [facility], [place] [address], [place], [place] [address]  . Iron 10/13/2017 41  41 - 142 ug/dL Final  . TIBC 10/13/2017 296  236 - 444 ug/dL Final  . Saturation Ratios 10/13/2017 14* 21 - 57 % Final  . UIBC 10/13/2017 256  ug/dL Final   Performed at [facility], [place] [address], [place], [place] [address]       Ardath Sax, MD

## 2017-10-24 ENCOUNTER — Other Ambulatory Visit (HOSPITAL_COMMUNITY): Payer: BC Managed Care – PPO

## 2017-10-27 ENCOUNTER — Other Ambulatory Visit (HOSPITAL_COMMUNITY): Payer: BC Managed Care – PPO

## 2017-10-28 ENCOUNTER — Other Ambulatory Visit (HOSPITAL_COMMUNITY): Payer: BC Managed Care – PPO

## 2017-10-29 ENCOUNTER — Other Ambulatory Visit (HOSPITAL_COMMUNITY): Payer: BC Managed Care – PPO

## 2017-10-30 ENCOUNTER — Other Ambulatory Visit (HOSPITAL_COMMUNITY): Payer: BC Managed Care – PPO

## 2017-10-31 ENCOUNTER — Other Ambulatory Visit (HOSPITAL_COMMUNITY): Payer: BC Managed Care – PPO

## 2017-11-03 ENCOUNTER — Other Ambulatory Visit (HOSPITAL_COMMUNITY): Payer: BC Managed Care – PPO

## 2017-11-04 ENCOUNTER — Ambulatory Visit (INDEPENDENT_AMBULATORY_CARE_PROVIDER_SITE_OTHER): Payer: BC Managed Care – PPO | Admitting: Licensed Clinical Social Worker

## 2017-11-04 ENCOUNTER — Encounter (HOSPITAL_COMMUNITY): Payer: Self-pay | Admitting: Licensed Clinical Social Worker

## 2017-11-04 DIAGNOSIS — F3162 Bipolar disorder, current episode mixed, moderate: Secondary | ICD-10-CM | POA: Diagnosis not present

## 2017-11-04 NOTE — H&P (Signed)
Patient name  Christine [name] DICTATION#  [id number] CSN# [id number]  Darlyn Chamber, MD 11/04/2017 10:23 AM

## 2017-11-04 NOTE — H&P (Signed)
Christine [name], Christine [name] NO.:  [id number]  MEDICAL RECORD NO.:  [id number]  LOCATION:                                 FACILITY:  PHYSICIAN:  Darlyn Chamber, M.D.   DATE OF BIRTH:  [birth year]  DATE OF ADMISSION: DATE OF DISCHARGE:                             HISTORY & PHYSICAL   DATE OF SURGERY:  November 27, 2017, at [facility] here in [place].  HISTORY OF PRESENT ILLNESS:  The patient is a 42 year old, gravida 4, para 0, abortus 32 female who has been having issues with increasing menorrhagia.  She is known to have uterine fibroids, is presently undergo IV infusions.  She came in for saline infusion ultrasound in February.  She had multiple uterine fibroids.  The largest measuring 4.9 x 3.7, the others were smaller.  She had a 2 cm left ovarian cyst. Right tube and ovary have been previously surgically removed.  On saline infusion ultrasound, she had a large mass in endometrial cavity that could be a polyp and/or fibroid.  She now presents for hysteroscopy with resection of the intrauterine growth.  ALLERGIES:  She is allergic to penicillin.  MEDICATIONS:  Lamictal 150 mg twice a day, lithium extended release 450 mg twice a day, [name] 1 mg a day, amlodipine 10 mg a day, meloxicam 7.5 mg twice a day, Flonase 1 time a day, Klonopin 1 mg as needed.  PAST MEDICAL HISTORY:  She has a history of bipolar disorder for which she is on the above medications.  History of anemia managed with IV iron infusions.  History of headaches, being managed for hypertension. History of arthritis.  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  SOCIAL HISTORY:  Reveals no tobacco or alcohol use.  FAMILY HISTORY:  Noncontributory.  REVIEW OF SYSTEMS:  Noncontributory.  PHYSICAL EXAMINATION:  VITAL SIGNS:   The patient is afebrile with stable vital signs. HEENT:  The patient is normocephalic.  Pupils are equal, round, reactive to light and accommodation.  Extraocular movements are intact.  Sclerae and conjunctivae are clear.  Oropharynx clear. NECK:  Without thyromegaly. LUNGS:  Clear. CARDIOVASCULAR:  Regular rhythm and rate without murmurs or gallops.  No carotid or abdominal bruits. ABDOMEN:  Benign. PELVIC:  Normal external genitalia.  Vaginal mucosa is clear.  Cervix unremarkable.  Usual size, shape, and contour.  Adnexa free of mass or tenderness.  IMPRESSION: 1. Menorrhagia with possible endometrial polyp.  It is recurrent. 2. Uterine fibroids. 3. Hypertension. 4. Bipolar disorder.  PLAN:  The patient is to undergo hysteroscopy, resection of endometrial polyp.  The risks of surgery have been discussed including the risk of infection.  The risk of hemorrhage that could require transfusion with the risk of AIDS or hepatitis.  Excessive hemorrhage could require hysterectomy.  There is a risk of perforation with injury to adjacent organs such as bowel.  This could require laparoscopy and possible exploratory surgery for management.  There is a risk of deep venous thrombosis and pulmonary embolus.  The  patient expressed understanding of the potential risk and complications.     Darlyn Chamber, M.D.     JSM/MEDQ  D:  11/04/2017  T:  11/04/2017  Job:  [id number]

## 2017-11-04 NOTE — Progress Notes (Signed)
   THERAPIST PROGRESS NOTE  Session Time:  9:10-10am  Participation Level: Active  Behavioral Response: CasualAlert/Euthymic  Type of Therapy: Individual Therapy  Treatment Goals addressed: Coping  Interventions: Supportive  Summary: Christine [name] is a 42 y.o. female who presents for her individual counseling session. Pt discussed her psychiatric symptoms and current life events.Pt presents back to individual therapy after discharge from [facility] and [place]. Pt presented less anxious and depressed. Pt reports she is more focused and her moods are stabilized. Her medications have been changed by [name]. She reports no crying spells. Pt has been working on having more of a routine to assist with her depressive symptoms and has also taken knitting. Asked open ended questions and used empathic reflection. Pt reports she is trying to not become so overwhelmed with tasks, and is trying to step back some. Asked open ended questions about her coping tools that she has worked on while in [place] and IOP. Pt reports she is also trying to have more balance and purpose in her life.    Suicidal/Homicidal: Nowithout intent/plan  Therapist Response: Assessed pt's current function by self report and reviewed progress. Assisted pt processing her psychiatric symptoms,  cukrrnet moods, stressors, coping tools ability to be more stable.  Assisted pt processing for the management of her stressors.   Plan: Return again in 2 weeks.  Diagnosis: Axis I:  Bipolar 1 Disorder, mixed moderate    MACKENZIE,LISBETH S, LCAS 11/04/2017

## 2017-11-05 ENCOUNTER — Other Ambulatory Visit (HOSPITAL_COMMUNITY): Payer: BC Managed Care – PPO

## 2017-11-06 ENCOUNTER — Other Ambulatory Visit (HOSPITAL_COMMUNITY): Payer: BC Managed Care – PPO

## 2017-11-07 ENCOUNTER — Other Ambulatory Visit (HOSPITAL_COMMUNITY): Payer: BC Managed Care – PPO

## 2017-11-10 ENCOUNTER — Other Ambulatory Visit (HOSPITAL_COMMUNITY): Payer: BC Managed Care – PPO

## 2017-11-11 ENCOUNTER — Other Ambulatory Visit (HOSPITAL_COMMUNITY): Payer: BC Managed Care – PPO

## 2017-11-12 ENCOUNTER — Other Ambulatory Visit (HOSPITAL_COMMUNITY): Payer: BC Managed Care – PPO

## 2017-11-13 ENCOUNTER — Other Ambulatory Visit (HOSPITAL_COMMUNITY): Payer: BC Managed Care – PPO

## 2017-11-14 ENCOUNTER — Other Ambulatory Visit (HOSPITAL_COMMUNITY): Payer: BC Managed Care – PPO

## 2017-11-17 ENCOUNTER — Other Ambulatory Visit (HOSPITAL_COMMUNITY): Payer: BC Managed Care – PPO

## 2017-11-18 ENCOUNTER — Other Ambulatory Visit (HOSPITAL_COMMUNITY): Payer: BC Managed Care – PPO

## 2017-11-18 ENCOUNTER — Ambulatory Visit (HOSPITAL_COMMUNITY): Payer: Self-pay | Admitting: Licensed Clinical Social Worker

## 2017-11-18 NOTE — Patient Instructions (Signed)
Your procedure is scheduled on: Thursday November 27, 2017 at 7:30 am  Enter through the Main Entrance of [facility] at: 6:00 am  Pick up the phone at the desk and dial [phone].  Call this number if you have problems the morning of surgery: [phone].  Remember: Do NOT eat food or drink any liquids after: Midnight on Wednesday April 24  Take these medicines the morning of surgery with a SIP OF WATER:  STOP ALL VITAMINS AND SUPPLEMENTS 1 WEEK PRIOR TO SURGERY  DO NOT SMOKE DAY OF SURGERY  Do NOT wear jewelry (body piercing), metal hair clips/bobby pins, make-up, or nail polish. Do NOT wear lotions, powders, or perfumes.  You may wear deoderant. Do NOT shave for 48 hours prior to surgery. Do NOT bring valuables to the hospital. Contacts, dentures, or bridgework may not be worn into surgery.  Have a responsible adult drive you home and stay with you for 24 hours after your procedure

## 2017-11-19 ENCOUNTER — Other Ambulatory Visit (HOSPITAL_COMMUNITY): Payer: BC Managed Care – PPO

## 2017-11-19 ENCOUNTER — Encounter (HOSPITAL_COMMUNITY)
Admission: RE | Admit: 2017-11-19 | Discharge: 2017-11-19 | Disposition: A | Discharge status: Home / Self Care | Payer: BC Managed Care – PPO | Source: Ambulatory Visit | Attending: Obstetrics and Gynecology | Admitting: Obstetrics and Gynecology

## 2017-11-20 ENCOUNTER — Other Ambulatory Visit (HOSPITAL_COMMUNITY): Payer: BC Managed Care – PPO

## 2017-11-20 NOTE — H&P (Signed)
Christine [name], WILMOUTH NO.:  [id number]  MEDICAL RECORD NO.:  [id number]  LOCATION:                                 FACILITY:  PHYSICIAN:  Darlyn Chamber, M.D.   DATE OF BIRTH:  1975/10/12  DATE OF ADMISSION: DATE OF DISCHARGE:                             HISTORY & PHYSICAL   DATE OF SURGERY:  November 27, 2017, at [facility] here in [place].  HISTORY OF PRESENT ILLNESS:  The patient is a __________-year-old, gravida 4, para 0, abortus 60 female who presents for hysteroscopy with MyoSure resection of apparent endometrial polyp.  The patient has had a long history of heavy cycles.  They have been regular.  She has 6 to 7 days of flow, 5 days being heavy with clots.  She is anemic because of this.  She had a previous hysteroscopic resection of a benign endometrial polyp and does have a history of uterine fibroids.  On saline infusion ultrasound, she had a 2.2 x 1.5 cm mass extending within the endometrial cavity that could be a polyp versus clot.  She now presents for hysteroscopic evaluation.  Still has multiple uterine fibroids.  ALLERGIES:  SHE IS ALLERGIC TO PENICILLIN.  MEDICATIONS: 1. Lamictal 150 mg twice a day. 2. Lithium extended release 450 mg twice a day. 3. [name] 1 mg a day. 4. Amlodipine 10 mg once a day. 5. Meloxicam 7.5 mg twice a day. 6. Flonase and Klonopin as needed.  PAST MEDICAL HISTORY: 1. She has had a history of hypertension on medication. 2. History of arthritis. 3. Previous urinary tract infection. 4. She is being managed for anemia. 5. Has a history of migraine headaches.  PAST SURGICAL HISTORY: 1. She had laparoscopic __________ removal of ovary and dermoid tumor     in 2001. 2. She had a D and C for miscarriage in 2005. 3. Had a hysteroscopic resection of benign endometrial polyp in 2012     and had another hysteroscopic resection of endometrial polyp in     2016.  FAMILY HISTORY:  History of colon cancer,  leukemia, hypertension, diabetes.  SOCIAL HISTORY:  Reveals no tobacco or alcohol use.  REVIEW OF SYSTEMS:  Noncontributory.  PHYSICAL EXAMINATION:  VITAL SIGNS:  The patient is afebrile with stable vital signs. HEENT:  The patient is normocephalic.  Pupils are equal, round, reactive to light and accommodation.  Extraocular movements were intact.  Sclerae and conjunctivae clear.  Oropharynx clear. LUNGS:  Clear. CARDIOVASCULAR:  Regular rate.  No murmurs or gallops. ABDOMEN:  Benign.  No masses, organomegaly, or tenderness. PELVIC:  Normal external genitalia.  Vaginal mucosa is clear.  Cervix is unremarkable.  Uterus is upper limits of normal size, irregular.  Adnexa unremarkable.  IMPRESSION: 1. Menorrhagia with associated anemia. 2. Endometrial polyp versus fibroid. 3. Uterine fibroids.  PLAN:  At the present time, the patient will undergo hysteroscopy with MyoSure resection.  The risks of surgery have been discussed including the risk of infection.  Risk of hemorrhage could require transfusion with the risk of AIDS or hepatitis, risk of injury to adjacent organs that could require exploratory surgery for management, risk of deep venous thrombosis and pulmonary embolus.  Some complications could require hysterectomy.  The patient expressed understanding of the indications and risks.Darlyn Chamber, M.D.   ______________________________ Darlyn Chamber, M.D.    JSM/MEDQ  D:  11/20/2017  T:  11/20/2017  Job:  [id number]

## 2017-11-20 NOTE — H&P (Signed)
Patient name Christine [name], Christine [name] DICTATION# [id number] CSN# [id number]  Darlyn Chamber, MD 11/20/2017 9:30 AM

## 2017-11-21 ENCOUNTER — Ambulatory Visit (HOSPITAL_COMMUNITY): Payer: BC Managed Care – PPO | Admitting: Psychiatry

## 2017-11-21 ENCOUNTER — Other Ambulatory Visit (HOSPITAL_COMMUNITY): Payer: BC Managed Care – PPO

## 2017-11-26 ENCOUNTER — Encounter (HOSPITAL_COMMUNITY)
Admission: RE | Admit: 2017-11-26 | Discharge: 2017-11-26 | Disposition: A | Discharge status: Home / Self Care | Payer: BC Managed Care – PPO | Source: Ambulatory Visit | Attending: Obstetrics and Gynecology | Admitting: Obstetrics and Gynecology

## 2017-11-26 ENCOUNTER — Ambulatory Visit (INDEPENDENT_AMBULATORY_CARE_PROVIDER_SITE_OTHER): Payer: BC Managed Care – PPO | Admitting: Psychiatry

## 2017-11-26 ENCOUNTER — Encounter (HOSPITAL_COMMUNITY): Payer: Self-pay | Admitting: Psychiatry

## 2017-11-26 DIAGNOSIS — R450 Nervousness: Secondary | ICD-10-CM

## 2017-11-26 DIAGNOSIS — Z818 Family history of other mental and behavioral disorders: Secondary | ICD-10-CM

## 2017-11-26 DIAGNOSIS — F411 Generalized anxiety disorder: Secondary | ICD-10-CM

## 2017-11-26 DIAGNOSIS — F3162 Bipolar disorder, current episode mixed, moderate: Secondary | ICD-10-CM

## 2017-11-26 MED ORDER — LAMOTRIGINE 150 MG PO TABS: 300.0000 mg | ORAL_TABLET | Freq: Every day | ORAL | 1 refills | Status: DC

## 2017-11-26 MED ORDER — CLONAZEPAM 0.5 MG PO TABS: 0.5000 mg | ORAL_TABLET | Freq: Two times a day (BID) | ORAL | 1 refills | Status: DC | PRN

## 2017-11-26 MED ORDER — LITHIUM CARBONATE ER 300 MG PO TBCR: EXTENDED_RELEASE_TABLET | ORAL | 1 refills | Status: DC

## 2017-11-26 MED ORDER — BREXPIPRAZOLE 2 MG PO TABS: 2.0000 mg | ORAL_TABLET | Freq: Every day | ORAL | 1 refills | Status: DC

## 2017-11-26 NOTE — Progress Notes (Signed)
BH MD/PA/NP OP Progress Note  11/26/2017 9:59 AM Christine [name]  MRN:  [id number]  Chief Complaint: I am doing better but is still have a lot of emotions and sometimes crying spells.  HPI: Christine [name] came for her follow-up appointment.  She is taking [name] 2 mg, Lamictal 300 and lithium 1500 mg a day.  She noticed her mood is stable but she still feels very anxious nervous and sometimes having crying spells and racing thoughts.  She denies any hallucination, mania, paranoia or any suicidal thoughts.  She is seeing Christine [name] every other week but realized that she may need to see every week.  She has not taken Klonopin in a while.  She has no tremors, shakes or any EPS.  She has no rash or itching.  She is scheduled to have iron infusion in May and hoping to have surgery for fibroid in June.  Sometimes she gets tired and she has no energy.  She lives with her husband and 2 adopted children.  Her husband is very supportive.  Patient denies any feelings of hopelessness or worthlessness.  Her energy level is fair.  Her appetite is okay.  Patient denies drinking alcohol or using any illegal substances.  She denies any violence or aggression.  Visit Diagnosis:    ICD-10-CM   1. Bipolar disorder, current episode mixed, moderate (HCC) F31.62 lithium carbonate (LITHOBID) 300 MG CR tablet    lamoTRIgine (LAMICTAL) 150 MG tablet    Brexpiprazole ([name]) 2 MG TABS  2. Generalized anxiety disorder F41.1 clonazePAM (KLONOPIN) 0.5 MG tablet    Past Psychiatric History: Reviewed with Patient had a history of depression since age 35.  She took antidepressant but remember making her manic.  She has seen Dr. Tomasita Crumble [name]'s office in the past and tried tried numerous medication including Risperdal, Seroquel, Abilify, lithium, Prozac, Paxil, Zoloft, Lexapro, Celexa, Effexor and Klonopin.   Patient has history of mania and impulsive behavior.  We have tried [name]. She has 3 psychiatric hospitalization.  She  also done PHP and intensive outpatient program.  She denies any history of suicidal attempt. Her last hospitalization was in November 2017.   Past Medical History:  Past Medical History:  Diagnosis Date  . Anemia   . Anxiety   . Bipolar 1 disorder ([place])   . Depression   . Fibroids   . High blood pressure   . Mental disorder   . PCOS (polycystic ovarian syndrome)     Past Surgical History:  Procedure Laterality Date  . DIAGNOSTIC LAPAROSCOPY    . DILATION AND CURETTAGE OF UTERUS    . HYSTEROSCOPY W/D&C  07/22/2011   Procedure: DILATATION AND CURETTAGE /HYSTEROSCOPY;  Surgeon: Christine Mourning, MD;  Location: [facility];  Service: Gynecology;;    Family Psychiatric History: Reviewed.  Family History:  Family History  Problem Relation Age of Onset  . Anxiety disorder Mother   . Depression Mother     Social History:  Social History   Socioeconomic History  . Marital status: Married    Spouse name: Not on file  . Number of children: Not on file  . Years of education: Not on file  . Highest education level: Not on file  Occupational History  . Not on file  Social Needs  . Financial resource strain: Somewhat hard  . Food insecurity:    Worry: Sometimes true    Inability: Sometimes true  . Transportation needs:    Medical: No    Non-medical: No  Tobacco Use  . Smoking status: Never Smoker  . Smokeless tobacco: Never Used  Substance and Sexual Activity  . Alcohol use: No    Comment: rare  . Drug use: No  . Sexual activity: Yes    Partners: Male    Birth control/protection: None  Lifestyle  . Physical activity:    Days per week: 0 days    Minutes per session: Not on file  . Stress: Rather much  Relationships  . Social connections:    Talks on phone: Never    Gets together: Never    Attends religious service: Never    Active member of club or organization: No    Attends meetings of clubs or organizations: Never    Relationship status: Not on file  Other  Topics Concern  . Not on file  Social History Narrative  . Not on file    Allergies:  Allergies  Allergen Reactions  . Penicillins Rash    Has patient had a PCN reaction causing immediate rash, facial/tongue/throat swelling, SOB or lightheadedness with hypotension: Yes Has patient had a PCN reaction causing severe rash involving mucus membranes or skin necrosis: Unknown Has patient had a PCN reaction that required hospitalization: No Has patient had a PCN reaction occurring within the last 10 years: No If all of the above answers are "NO", then may proceed with Cephalosporin use.     Metabolic Disorder Labs: No results found for: HGBA1C, MPG No results found for: PROLACTIN No results found for: CHOL, TRIG, HDL, CHOLHDL, VLDL, LDLCALC No results found for: TSH  Therapeutic Level Labs: Lab Results  Component Value Date   LITHIUM 0.7 10/17/2017   LITHIUM 0.6 10/10/2017   No results found for: VALPROATE No components found for:  CBMZ  Current Medications: Current Outpatient Medications  Medication Sig Dispense Refill  . amLODipine (NORVASC) 10 MG tablet Take 1 tablet (10 mg total) by mouth daily. 30 tablet 0  . Brexpiprazole ([name]) 2 MG TABS Take 2 mg by mouth daily. 30 tablet 0  . clonazePAM (KLONOPIN) 0.5 MG tablet Take 1 tablet (0.5 mg total) by mouth 2 (two) times daily as needed. 30 tablet 0  . fluticasone (FLONASE) 50 MCG/ACT nasal spray Place 1 spray into both nostrils daily.    [name] lamoTRIgine (LAMICTAL) 150 MG tablet Take 2 tablets (300 mg total) by mouth daily. (Patient taking differently: Take 150 mg by mouth 2 (two) times daily. ) 60 tablet 0  . lithium carbonate (LITHOBID) 300 MG CR tablet Take 600 mg in am and 600 mg at pm and 300 mg at bed time (Patient taking differently: Take 300-600 mg by mouth 2 (two) times daily. Take 600 mg in am and 900 mg at bed time) 150 tablet 0  . meloxicam (MOBIC) 7.5 MG tablet Take 7.5 mg by mouth 2 (two) times daily as needed for  pain.   2   No current facility-administered medications for this visit.      Musculoskeletal: Strength & Muscle Tone: within normal limits Gait & Station: normal Patient leans: N/A  Psychiatric Specialty Exam: Review of Systems  Psychiatric/Behavioral: The patient is nervous/anxious.     Blood pressure (!) 153/82, pulse 77, height 5\' 5"  (1.651 m).There is no height or weight on file to calculate BMI.  General Appearance: Casual  Eye Contact:  Good  Speech:  Clear and Coherent  Volume:  Normal  Mood:  Anxious  Affect:  Appropriate  Thought Process:  Goal Directed  Orientation:  Full (Time, Place, and Person)  Thought Content: Rumination   Suicidal Thoughts:  No  Homicidal Thoughts:  No  Memory:  Immediate;   Fair Recent;   Fair Remote;   Fair  Judgement:  Good  Insight:  Good  Psychomotor Activity:  Normal  Concentration:  Concentration: Fair and Attention Span: Fair  Recall:  Good  Fund of Knowledge: Good  Language: Good  Akathisia:  No  Handed:  Right  AIMS (if indicated): not done  Assets:  Communication Skills Desire for Improvement Housing Resilience  ADL's:  Intact  Cognition: WNL  Sleep:  Fair   Screenings: GAD-7     Counselor from 09/09/2017 in [facility]or from 04/04/2017 in [facility] from 03/18/2017 in [facility]  Total GAD-7 Score  16  7  18     PHQ2-9     Counselor from 09/26/2017 in [facility]or from 09/19/2017 in [facility]or from 09/09/2017 in [facility] Counselor from 04/04/2017 in [facility] from 03/28/2017 in [facility]  PHQ-2 Total Score  3  4  6  2  4   PHQ-9 Total Score  12  16  21  10  15         Assessment and Plan: Bipolar disorder type I.  Generalized anxiety disorder.  Patient continued to have chronic anxiety and nervousness.  I recommended to take Klonopin every day to help the anxiety and see Christine [name] every week.  Patient does not want to change her current psychiatric medication.  Her last lithium level was 0.7 which was done in March.  I will continue Lamictal 300 mg a day, lithium 1500 mg a day and [name] 2 mg daily.  Encouraged to take Klonopin 0.5 mg more often if needed for anxiety.  Recommended to call us back if she has any question or any concern.  Follow-up in 2 months.     Kathlee Nations, MD 11/26/2017, 9:59 AM

## 2017-12-03 ENCOUNTER — Encounter (HOSPITAL_COMMUNITY): Payer: Self-pay | Admitting: Licensed Clinical Social Worker

## 2017-12-03 ENCOUNTER — Ambulatory Visit (INDEPENDENT_AMBULATORY_CARE_PROVIDER_SITE_OTHER): Payer: BC Managed Care – PPO | Admitting: Licensed Clinical Social Worker

## 2017-12-03 DIAGNOSIS — F3162 Bipolar disorder, current episode mixed, moderate: Secondary | ICD-10-CM | POA: Diagnosis not present

## 2017-12-03 NOTE — Progress Notes (Signed)
   THERAPIST PROGRESS NOTE  Session Time:  9:10-10am  Participation Level: Active  Behavioral Response: CasualAlert/Euthymic  Type of Therapy: Individual Therapy  Treatment Goals addressed: Coping  Interventions: Supportive  Summary: Christine [name] is a 42 y.o. female who presents for her individual counseling session. Pt discussed her psychiatric symptoms and current life events. Pt has not been to therapy in 30 days, having canceled her last appt. Pt presented tearful and depressed. She continues with scattered and racing thoughts. Asked open ended questions: What happens when she leaves PHP and IOP doing so well? Which coping tools is she using that she learned from programs? Discussed scheduling with pt and gave her schedule. Discussed what to put on schedule giving herself lots of self care time. Pt became teary at the thought of not being able to work a whole day. Discussed mindfulness with pt.      Suicidal/Homicidal: Nowithout intent/plan  Therapist Response: Assessed pt's current function by self report and reviewed progress. Assisted pt processing her psychiatric symptoms,  current moods, stressors, coping tools, scheduling, self care.  Assisted pt processing for the management of her stressors.   Plan: Return again in 2 weeks.  Diagnosis: Axis I:  Bipolar 1 Disorder, mixed moderate    Shermaine Rivet S, LCAS 12/03/2017

## 2017-12-15 ENCOUNTER — Inpatient Hospital Stay: Payer: BC Managed Care – PPO | Attending: Hematology and Oncology

## 2017-12-15 DIAGNOSIS — D259 Leiomyoma of uterus, unspecified: Secondary | ICD-10-CM | POA: Insufficient documentation

## 2017-12-15 DIAGNOSIS — D500 Iron deficiency anemia secondary to blood loss (chronic): Secondary | ICD-10-CM | POA: Insufficient documentation

## 2017-12-15 DIAGNOSIS — N920 Excessive and frequent menstruation with regular cycle: Secondary | ICD-10-CM | POA: Insufficient documentation

## 2017-12-15 LAB — CMP (CANCER CENTER ONLY)
ALT: 18 U/L (ref 0–55)
AST: 10 U/L (ref 5–34)
Albumin: 3.9 g/dL (ref 3.5–5.0)
Alkaline Phosphatase: 63 U/L (ref 40–150)
Anion gap: 6 (ref 3–11)
BUN: 9 mg/dL (ref 7–26)
CO2: 25 mmol/L (ref 22–29)
Calcium: 9.4 mg/dL (ref 8.4–10.4)
Chloride: 109 mmol/L (ref 98–109)
Creatinine: 0.83 mg/dL (ref 0.60–1.10)
GFR, Est AFR Am: >60 mL/min (ref >60)
GFR, Estimated: >60 mL/min (ref >60)
Glucose, Bld: 100 mg/dL (ref 70–140)
Potassium: 3.9 mmol/L (ref 3.5–5.1)
Sodium: 140 mmol/L (ref 136–145)
Total Bilirubin: 0.3 mg/dL (ref 0.2–1.2)
Total Protein: 7.3 g/dL (ref 6.4–8.3)

## 2017-12-15 LAB — CBC WITH DIFFERENTIAL (CANCER CENTER ONLY)
Basophils Absolute: 0 10*3/uL (ref 0.0–0.1)
Basophils Relative: 0 %
Eosinophils Absolute: 0.1 10*3/uL (ref 0.0–0.5)
Eosinophils Relative: 1 %
HCT: 32.8 % — ABNORMAL LOW (ref 34.8–46.6)
Hemoglobin: 10.5 g/dL — ABNORMAL LOW (ref 11.6–15.9)
Lymphocytes Relative: 21 %
Lymphs Abs: 1.7 10*3/uL (ref 0.9–3.3)
MCH: 27.9 pg (ref 25.1–34.0)
MCHC: 32 g/dL (ref 31.5–36.0)
MCV: 87.2 fL (ref 79.5–101.0)
Monocytes Absolute: 0.8 10*3/uL (ref 0.1–0.9)
Monocytes Relative: 9 %
Neutro Abs: 5.8 10*3/uL (ref 1.5–6.5)
Neutrophils Relative %: 69 %
Platelet Count: 338 10*3/uL (ref 145–400)
RBC: 3.77 MIL/uL (ref 3.70–5.45)
RDW: 17 % — ABNORMAL HIGH (ref 11.2–14.5)
WBC Count: 8.4 10*3/uL (ref 3.9–10.3)

## 2017-12-15 LAB — IRON AND TIBC
Iron: 27 ug/dL — ABNORMAL LOW (ref 41–142)
Saturation Ratios: 7 % — ABNORMAL LOW (ref 21–57)
TIBC: 356 ug/dL (ref 236–444)
UIBC: 330 ug/dL

## 2017-12-15 LAB — FERRITIN: Ferritin: 6 ng/mL — ABNORMAL LOW (ref 9–269)

## 2017-12-17 ENCOUNTER — Ambulatory Visit (HOSPITAL_COMMUNITY): Payer: Self-pay | Admitting: Licensed Clinical Social Worker

## 2017-12-18 ENCOUNTER — Encounter: Payer: Self-pay | Admitting: Hematology and Oncology

## 2017-12-18 ENCOUNTER — Inpatient Hospital Stay (HOSPITAL_BASED_OUTPATIENT_CLINIC_OR_DEPARTMENT_OTHER): Payer: BC Managed Care – PPO | Admitting: Hematology and Oncology

## 2017-12-18 ENCOUNTER — Telehealth: Payer: Self-pay

## 2017-12-18 ENCOUNTER — Inpatient Hospital Stay: Payer: BC Managed Care – PPO

## 2017-12-18 VITALS — BP 126/65 | HR 95 | Temp 98.7°F | Resp 17

## 2017-12-18 VITALS — BP 162/99 | HR 100 | Temp 99.0°F | Resp 18 | Ht 65.0 in | Wt 346.9 lb

## 2017-12-18 DIAGNOSIS — D500 Iron deficiency anemia secondary to blood loss (chronic): Secondary | ICD-10-CM | POA: Diagnosis not present

## 2017-12-18 DIAGNOSIS — N920 Excessive and frequent menstruation with regular cycle: Secondary | ICD-10-CM

## 2017-12-18 DIAGNOSIS — D259 Leiomyoma of uterus, unspecified: Secondary | ICD-10-CM

## 2017-12-18 MED ORDER — FAMOTIDINE IN NACL 20-0.9 MG/50ML-% IV SOLN
20.0000 mg | Freq: Once | INTRAVENOUS | Status: AC
  Administered 2017-12-18: 20 mg via INTRAVENOUS

## 2017-12-18 MED ORDER — SODIUM CHLORIDE 0.9 % IV SOLN
510.0000 mg | Freq: Once | INTRAVENOUS | Status: AC
  Administered 2017-12-18: 510 mg via INTRAVENOUS
  Filled 2017-12-18: qty 17

## 2017-12-18 MED ORDER — DIPHENHYDRAMINE HCL 50 MG/ML IJ SOLN
INTRAMUSCULAR | Status: AC
Start: 2017-12-18 — End: ?
  Filled 2017-12-18: qty 1

## 2017-12-18 MED ORDER — DIPHENHYDRAMINE HCL 50 MG/ML IJ SOLN
12.5000 mg | Freq: Once | INTRAMUSCULAR | Status: AC
  Administered 2017-12-18: 12.5 mg via INTRAVENOUS

## 2017-12-18 MED ORDER — SODIUM CHLORIDE 0.9 % IV SOLN
Freq: Once | INTRAVENOUS | Status: AC
  Administered 2017-12-18: 09:00:00 via INTRAVENOUS

## 2017-12-18 MED ORDER — FAMOTIDINE IN NACL 20-0.9 MG/50ML-% IV SOLN
INTRAVENOUS | Status: AC
  Filled 2017-12-18: qty 50

## 2017-12-18 NOTE — Telephone Encounter (Signed)
Printed avs and calender of upcoming appointment. Per 5/16 los 

## 2017-12-18 NOTE — Patient Instructions (Signed)

## 2018-01-02 NOTE — Assessment & Plan Note (Signed)
42 y.o. female with menorrhagia with regular cycle likely attributable to presence of uterine fibroids and new discovery of uterine polyp associated with severe microcytic hypochromic anemia and profound iron depletion.  Patient was started on Feraheme supplementation resulting in improvement in her anemia.  Last visit to the clinic, patient procedures for removal of the polyp and fibroids.  Globin remained stable, but ferritin value has dropped significantly attributable to additional blood loss and subsequent utilization of iron for recovery.  Plan: --Proceed with IV Feraheme x1 today -Return to clinic in 1 months with labs obtained 2-3 days prior for continued monitoring of iron replacement deficiency.  Possible additional parenteral iron administration at that time.

## 2018-01-02 NOTE — Progress Notes (Signed)
[facility] Follow-up Visit:  Assessment: Iron deficiency anemia due to chronic blood loss 42 y.o. female with menorrhagia with regular cycle likely attributable to presence of uterine fibroids and new discovery of uterine polyp associated with severe microcytic hypochromic anemia and profound iron depletion.  Patient was started on Feraheme supplementation resulting in improvement in her anemia.  Last visit to the clinic, patient procedures for removal of the polyp and fibroids.  Globin remained stable, but ferritin value has dropped significantly attributable to additional blood loss and subsequent utilization of iron for recovery.  Plan: --Proceed with IV Feraheme x1 today -Return to clinic in 1 months with labs obtained 2-3 days prior for continued monitoring of iron replacement deficiency.  Possible additional parenteral iron administration at that time.   Voice recognition software was used and creation of this note. Despite my best effort at editing the text, some misspelling/errors may have occurred.  Orders Placed This Encounter  Procedures  . CBC with Differential (Cancer Center Only)    Standing Status:   Future    Standing Expiration Date:   12/19/2018  . CMP ([place] only)    Standing Status:   Future    Standing Expiration Date:   12/19/2018  . Iron and TIBC    Standing Status:   Future    Standing Expiration Date:   12/19/2018  . Ferritin    Standing Status:   Future    Standing Expiration Date:   12/19/2018    Cancer Staging No matching staging information was found for the patient.  All questions were answered.  . The patient knows to call the clinic with any problems, questions or concerns.  This note was electronically signed.    History of Presenting Illness Christine [name] is a 42 y.o. female followed in the [facility] for iron deficiency anemia due to recurrent blood loss, referred by Maude Leriche, PA-C.  Patient reports  heavy menstrual periods for an extended period of time.  Her cycles usually last every 25 days with 7 days of bleeding with for 5 days of heavy bleeding requiring 4-5 maxipads per day.  Prior to most recent admission in January, patient had a 10-day long period of bleeding.  Was admitted to the hospital on 08/08/17 received transfusion of 3 units of packed red blood cells as well as an IV iron infusion with Feraheme.  Since last visit to the clinic, patient has received a single dose of Feraheme on 10/15/2017 associated with no additional symptoms compared to the initial infusion.  Subsequently, patient underwent hysteroscopy with resection of a uterine fibroid on 11/04/2017 and repeat procedure on 11/20/2017.  Pathology reports are not available from those procedures.  In the interim, patient reports significant increase in fatigue over the past 2 weeks.  Oncological/hematological History: --Labs, 09/18/16: Hgb   7.1, MCV 69.0, MCH 18.3, MCHC     ..., RDW 26.7, Plt 408; TSH 0.58 --Labs, 08/29/17: Hgb   7.9, MCV 70.1, MCH 20.9, MCHC 29.8, RDW 22.5, Plt 368; Fe 28, FeSat   7%, TIBC 428, Ferritin   2, Folate 16.5, Vit B12 372 --Treatment:  --Feraheme '510mg'$  IV x1, 09/24/17 --Labs, 10/13/17: Hgb 10.6, MCV 86.4, MCH 26.3, MCHC 30.5, RDW 27.6, Plt 291; Fe 41, FeSat 14%, TIBC 296, Ferritin 44;  --Feraheme '510mg'$  IV x1, 10/15/17 --Labs, 12/15/17: Hgb 10.5, MCV 87.2, MCH 27.9, MCHC 32.0, RDW 17.0, Plt 338; Fe 27, FeSat   7%, TIBC 356, Ferritin   6;  --Feraheme  $'510mg'l$  IV x1, 12/18/17    No history exists.    Medical History: Past Medical History:  Diagnosis Date  . Anemia   . Anxiety   . Bipolar 1 disorder ([place])   . Depression   . Fibroids   . High blood pressure   . Mental disorder   . PCOS (polycystic ovarian syndrome)     Surgical History: Past Surgical History:  Procedure Laterality Date  . DIAGNOSTIC LAPAROSCOPY    . DILATION AND CURETTAGE OF UTERUS    . HYSTEROSCOPY W/D&C  07/22/2011    Procedure: DILATATION AND CURETTAGE /HYSTEROSCOPY;  Surgeon: Cyril Mourning, MD;  Location: [facility];  Service: Gynecology;;    Family History: Family History  Problem Relation Age of Onset  . Anxiety disorder Mother   . Depression Mother     Social History: Social History   Socioeconomic History  . Marital status: Married    Spouse name: Not on file  . Number of children: Not on file  . Years of education: Not on file  . Highest education level: Not on file  Occupational History  . Not on file  Social Needs  . Financial resource strain: Somewhat hard  . Food insecurity:    Worry: Sometimes true    Inability: Sometimes true  . Transportation needs:    Medical: No    Non-medical: No  Tobacco Use  . Smoking status: Never Smoker  . Smokeless tobacco: Never Used  Substance and Sexual Activity  . Alcohol use: No    Comment: rare  . Drug use: No  . Sexual activity: Yes    Partners: Male    Birth control/protection: None  Lifestyle  . Physical activity:    Days per week: 0 days    Minutes per session: Not on file  . Stress: Rather much  Relationships  . Social connections:    Talks on phone: Never    Gets together: Never    Attends religious service: Never    Active member of club or organization: No    Attends meetings of clubs or organizations: Never    Relationship status: Not on file  . Intimate partner violence:    Fear of current or ex partner: No    Emotionally abused: No    Physically abused: No    Forced sexual activity: No  Other Topics Concern  . Not on file  Social History Narrative  . Not on file    Allergies: Allergies  Allergen Reactions  . Penicillins Rash    Has patient had a PCN reaction causing immediate rash, facial/tongue/throat swelling, SOB or lightheadedness with hypotension: Yes Has patient had a PCN reaction causing severe rash involving mucus membranes or skin necrosis: Unknown Has patient had a PCN reaction that required  hospitalization: No Has patient had a PCN reaction occurring within the last 10 years: No If all of the above answers are "NO", then may proceed with Cephalosporin use.     Medications:  Current Outpatient Medications  Medication Sig Dispense Refill  . amLODipine (NORVASC) 10 MG tablet Take 1 tablet (10 mg total) by mouth daily. 30 tablet 0  . Brexpiprazole ([name]) 2 MG TABS Take 2 mg by mouth daily. 30 tablet 1  . clonazePAM (KLONOPIN) 0.5 MG tablet Take 1 tablet (0.5 mg total) by mouth 2 (two) times daily as needed. 30 tablet 1  . fluticasone (FLONASE) 50 MCG/ACT nasal spray Place 1 spray into both nostrils daily.    [name] lamoTRIgine (  LAMICTAL) 150 MG tablet Take 2 tablets (300 mg total) by mouth daily. 60 tablet 1  . lithium carbonate (LITHOBID) 300 MG CR tablet Take 600 mg in am and 600 mg at pm and 300 mg at bed time 150 tablet 1  . meloxicam (MOBIC) 7.5 MG tablet Take 7.5 mg by mouth 2 (two) times daily as needed for pain.   2   No current facility-administered medications for this visit.     Review of Systems: Review of Systems  Constitutional: Positive for fatigue.  Genitourinary: Positive for menstrual problem.   All other systems reviewed and are negative.    PHYSICAL EXAMINATION Blood pressure (!) 162/99, pulse 100, temperature 99 F (37.2 C), temperature source Oral, resp. rate 18, height '5\' 5"'$  (1.651 m), weight (!) 346 lb 14.4 oz (157.4 kg), SpO2 100 %.  ECOG PERFORMANCE STATUS: 1 - Symptomatic but completely ambulatory  Physical Exam  Constitutional: She is oriented to person, place, and time and well-developed, well-nourished, and in no distress. No distress.  HENT:  Head: Normocephalic and atraumatic.  Mouth/Throat: Oropharynx is clear and moist. No oropharyngeal exudate.  Eyes: Pupils are equal, round, and reactive to light. Conjunctivae and EOM are normal. No scleral icterus.  Neck: Normal range of motion. Neck supple. No thyromegaly present.  Cardiovascular:  Normal rate, regular rhythm, normal heart sounds and intact distal pulses.  No murmur heard. Pulmonary/Chest: Breath sounds normal. No respiratory distress. She has no wheezes. She has no rales.  Abdominal: Soft. Bowel sounds are normal. She exhibits no distension and no mass. There is no tenderness. There is no guarding.  Musculoskeletal: She exhibits no edema.  Lymphadenopathy:    She has no cervical adenopathy.  Neurological: She is alert and oriented to person, place, and time. She has normal reflexes. No cranial nerve deficit.  Skin: Skin is warm and dry. No rash noted. She is not diaphoretic. No erythema. There is pallor.     LABORATORY DATA: I have personally reviewed the data as listed: Appointment on 12/15/2017  Component Date Value Ref Range Status  . Ferritin 12/15/2017 6* 9 - 269 ng/mL Final   Performed at [facility], [place] [address], [place], [place] [address]  . Iron 12/15/2017 27* 41 - 142 ug/dL Final  . TIBC 12/15/2017 356  236 - 444 ug/dL Final  . Saturation Ratios 12/15/2017 7* 21 - 57 % Final  . UIBC 12/15/2017 330  ug/dL Final   Performed at [facility], [place] [address] [place], [place] [address]  . Sodium 12/15/2017 140  136 - 145 mmol/L Final  . Potassium 12/15/2017 3.9  3.5 - 5.1 mmol/L Final  . Chloride 12/15/2017 109  98 - 109 mmol/L Final  . CO2 12/15/2017 25  22 - 29 mmol/L Final  . Glucose, Bld 12/15/2017 100  70 - 140 mg/dL Final  . BUN 12/15/2017 9  7 - 26 mg/dL Final  . Creatinine 12/15/2017 0.83  0.60 - 1.10 mg/dL Final  . Calcium 12/15/2017 9.4  8.4 - 10.4 mg/dL Final  . Total Protein 12/15/2017 7.3  6.4 - 8.3 g/dL Final  . Albumin 12/15/2017 3.9  3.5 - 5.0 g/dL Final  . AST 12/15/2017 10  5 - 34 U/L Final  . ALT 12/15/2017 18  0 - 55 U/L Final  . Alkaline Phosphatase 12/15/2017 63  40 - 150 U/L Final  . Total Bilirubin 12/15/2017 0.3  0.2 - 1.2 mg/dL Final  . GFR, Est  Non Af Am 12/15/2017 >60   >60 mL/min Final  . GFR, Est AFR Am 12/15/2017 >60  >60 mL/min Final   Comment: (NOTE) The eGFR has been calculated using the CKD EPI equation. This calculation has not been validated in all clinical situations. eGFR's persistently <60 mL/min signify possible Chronic Kidney Disease.   Georgiann Hahn gap 12/15/2017 6  3 - 11 Final   Performed at [facility], [place] [address], [place], [place] [address]  . WBC Count 12/15/2017 8.4  3.9 - 10.3 K/uL Final  . RBC 12/15/2017 3.77  3.70 - 5.45 MIL/uL Final  . Hemoglobin 12/15/2017 10.5* 11.6 - 15.9 g/dL Final  . HCT 12/15/2017 32.8* 34.8 - 46.6 % Final  . MCV 12/15/2017 87.2  79.5 - 101.0 fL Final  . MCH 12/15/2017 27.9  25.1 - 34.0 pg Final  . MCHC 12/15/2017 32.0  31.5 - 36.0 g/dL Final  . RDW 12/15/2017 17.0* 11.2 - 14.5 % Final  . Platelet Count 12/15/2017 338  145 - 400 K/uL Final  . Neutrophils Relative % 12/15/2017 69  % Final  . Neutro Abs 12/15/2017 5.8  1.5 - 6.5 K/uL Final  . Lymphocytes Relative 12/15/2017 21  % Final  . Lymphs Abs 12/15/2017 1.7  0.9 - 3.3 K/uL Final  . Monocytes Relative 12/15/2017 9  % Final  . Monocytes Absolute 12/15/2017 0.8  0.1 - 0.9 K/uL Final  . Eosinophils Relative 12/15/2017 1  % Final  . Eosinophils Absolute 12/15/2017 0.1  0.0 - 0.5 K/uL Final  . Basophils Relative 12/15/2017 0  % Final  . Basophils Absolute 12/15/2017 0.0  0.0 - 0.1 K/uL Final   Performed at [facility], [place] [address] [place], [place] [address]       Ardath Sax, MD

## 2018-01-04 NOTE — H&P (Signed)
Patient name Christine [name], Christine [name] DICTATION# [id number] CSN# [id number]  Darlyn Chamber, MD 01/04/2018 9:36 AM

## 2018-01-05 NOTE — H&P (Signed)
NAMEAMBRY, DIX MEDICAL RECORD IO:[id number] ACCOUNT [id number] DATE OF BIRTH:1976-04-20 FACILITY: [place] LOCATION: [place], [place]  HISTORY AND PHYSICAL  DATE OF ADMISSION:  01/15/2018  The patient's surgery is scheduled for 01/15/2018 at [facility] here in [place].  HISTORY OF PRESENT ILLNESS:  The patient is a 42 year old gravida 43, para 0, abortus 70 female, who has been having trouble with increasing menorrhagia.  She is known to have uterine fibroids.  She had a saline infusion ultrasound that revealed multiple  uterine fibroids, largest measuring 4.9 x 3.7; others were smaller.  She had a 2 cm left ovarian cyst.  Right tube and ovary had previously been surgically removed on saline infusion.  She had a large mass within the endometrial cavity that could be a  polyp or fibroid.  She now presents for hysteroscopy, resection of intrauterine growth.  ALLERGIES:  ALLERGIC TO PENICILLIN.  MEDICATIONS:  Lamictal 150 mg twice a day.  Lithium extended release 450 mg twice a day,  [name] 1 mg a day.  Amlodipine 10 mg a day.  Meloxicam 7.5 mg twice a day, Flonase once a day, Klonopin 1 mg as needed.  PAST MEDICAL HISTORY:  She has a history of bipolar disorder for which she is under active management.  History of anemia managed with IV infusions.  History of headaches.  Hypertension and arthritis.  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  She has had a D and C for miscarriage in 2005, had a uterine polyp removed in 2012 had another uterine polyp removed in 2016.  SOCIAL HISTORY:  Reveals no tobacco or alcohol use.  FAMILY HISTORY:  Noncontributory.  REVIEW OF SYSTEMS:  Noncontributory.  PHYSICAL EXAMINATION: VITAL SIGNS:  Afebrile, stable vital signs. HEENT:  The patient is normocephalic.  Pupils equal, round and reactive to light and accommodation.  Extraocular movements are intact.  Sclerae and  conjunctivae are clear.  Oropharynx clear. NECK:  Without thyromegaly. BREASTS:  There are no discrete masses. LUNGS:  Clear. HEART:  Regular rhythm and rate without murmurs or gallops.  There is no carotid or abdominal  bruits.   ABDOMEN:  Her abdominal exam is benign.  No masses or tenderness.   On pelvic she has normal external genitalia.  Vaginal mucosa is clear.  Cervix is unremarkable.  Uterus is upper limits of normal in size consistent with known fibroids.  Adnexa unremarkable.  Exam somewhat limited by obesity. EXTREMITIES:  Trace edema. NEUROLOGIC:  Grossly within normal limits.  IMPRESSION: 1.  Menorrhagia with associated anemia and presence of a parent intrauterine fibroid and polyp. 2.  Hypertension. 3.  Bipolar disorder. 4.  Previous right salpingo-oophorectomy for dermoid tumor.  PLAN:  The patient is admitted to undergo hysteroscopic resection of the intrauterine mass.  The nature of the procedure have been discussed.  The risks and benefits explained including the risk of infection.  Possible risk of vascular injury.  This  could lead to hemorrhage requiring transfusion with the risk of AIDS and hepatitis.  Excessive bleeding could require hysterectomy.  There is risk of perforation with injury to adjacent organs including bowel that could require further exploratory  surgery.  Risk of deep venous thrombosis and pulmonary embolus.  The patient does express understanding of indications and potential risk.  AN/NUANCE  D:01/04/2018 T:01/04/2018 JOB:[id number]

## 2018-01-06 NOTE — Patient Instructions (Addendum)
Your procedure is scheduled on:  Thursday, June 13  Enter through the Main Entrance of [facility] at: 6 am  Pick up the phone at the desk and dial [phone].  Call this number if you have problems the morning of surgery: [phone].  Remember: Do NOT eat food or Do NOT drink clear liquids (including water) after midnight Wednesday.  Take these medicines the morning of surgery with a SIP OF WATER: amlodipine, rexulti, lamictal, lithium.  Flonase and klonopin if needed.  Do Not smoke on the day of surgery.  Stop herbal medications, vitamin supplements, ibuprofen/NSAIDs 1 week prior to surgery.  Do NOT wear jewelry (body piercing), metal hair clips/bobby pins, make-up, or nail polish. Do NOT wear lotions, powders, or perfumes.  You may wear deoderant. Do NOT shave for 48 hours prior to surgery. Do NOT bring valuables to the hospital. Contacts may not be worn into surgery.  Have a responsible adult drive you home and stay with you for 24 hours after your procedure. Home with husband Christine [name] cell [phone]

## 2018-01-07 ENCOUNTER — Other Ambulatory Visit: Payer: Self-pay

## 2018-01-07 ENCOUNTER — Encounter (HOSPITAL_COMMUNITY): Payer: Self-pay

## 2018-01-07 ENCOUNTER — Encounter (HOSPITAL_COMMUNITY)
Admission: RE | Admit: 2018-01-07 | Discharge: 2018-01-07 | Disposition: A | Discharge status: Home / Self Care | Payer: BC Managed Care – PPO | Source: Ambulatory Visit | Attending: Obstetrics and Gynecology | Admitting: Obstetrics and Gynecology

## 2018-01-07 DIAGNOSIS — Z01812 Encounter for preprocedural laboratory examination: Secondary | ICD-10-CM | POA: Insufficient documentation

## 2018-01-07 HISTORY — DX: Other seasonal allergic rhinitis: J30.2

## 2018-01-07 HISTORY — DX: Headache: R51

## 2018-01-07 HISTORY — DX: Personal history of other medical treatment: Z92.89

## 2018-01-07 HISTORY — DX: Unspecified osteoarthritis, unspecified site: M19.90

## 2018-01-07 HISTORY — DX: Headache, unspecified: R51.9

## 2018-01-07 LAB — CBC
HCT: 35.8 % — ABNORMAL LOW (ref 36.0–46.0)
Hemoglobin: 11 g/dL — ABNORMAL LOW (ref 12.0–15.0)
MCH: 27.7 pg (ref 26.0–34.0)
MCHC: 30.7 g/dL (ref 30.0–36.0)
MCV: 90.2 fL (ref 78.0–100.0)
Platelets: 375 10*3/uL (ref 150–400)
RBC: 3.97 MIL/uL (ref 3.87–5.11)
RDW: 17.8 % — ABNORMAL HIGH (ref 11.5–15.5)
WBC: 9.4 10*3/uL (ref 4.0–10.5)

## 2018-01-07 LAB — TYPE AND SCREEN
ABO/RH(D): O POS
Antibody Screen: NEGATIVE

## 2018-01-07 LAB — BASIC METABOLIC PANEL
Anion gap: 10 (ref 5–15)
BUN: 12 mg/dL (ref 6–20)
CO2: 22 mmol/L (ref 22–32)
Calcium: 9.2 mg/dL (ref 8.9–10.3)
Chloride: 107 mmol/L (ref 101–111)
Creatinine, Ser: 0.86 mg/dL (ref 0.44–1.00)
GFR calc Af Amer: >60 mL/min (ref >60)
GFR calc non Af Amer: >60 mL/min (ref >60)
Glucose, Bld: 95 mg/dL (ref 65–99)
Potassium: 4.1 mmol/L (ref 3.5–5.1)
Sodium: 139 mmol/L (ref 135–145)

## 2018-01-07 LAB — ABO/RH: ABO/RH(D): O POS

## 2018-01-07 NOTE — Pre-Procedure Instructions (Signed)
SDS BB history Log sent to lab for patient's history of a previous blood transfusion at [facility] in 08/2017.

## 2018-01-12 ENCOUNTER — Encounter

## 2018-01-12 ENCOUNTER — Ambulatory Visit (HOSPITAL_COMMUNITY): Payer: Self-pay | Admitting: Licensed Clinical Social Worker

## 2018-01-13 ENCOUNTER — Other Ambulatory Visit: Payer: Self-pay

## 2018-01-15 ENCOUNTER — Ambulatory Visit (HOSPITAL_COMMUNITY): Payer: BC Managed Care – PPO | Admitting: Anesthesiology

## 2018-01-15 ENCOUNTER — Ambulatory Visit: Payer: Self-pay

## 2018-01-15 ENCOUNTER — Ambulatory Visit: Payer: Self-pay | Admitting: Nurse Practitioner

## 2018-01-15 ENCOUNTER — Ambulatory Visit (HOSPITAL_COMMUNITY)
Admission: AD | Admit: 2018-01-15 | Discharge: 2018-01-15 | Disposition: A | Discharge status: Home / Self Care | Payer: BC Managed Care – PPO | Source: Ambulatory Visit | Attending: Obstetrics and Gynecology | Admitting: Obstetrics and Gynecology

## 2018-01-15 ENCOUNTER — Encounter (HOSPITAL_COMMUNITY)
Admission: AD | Disposition: A | Discharge status: Home / Self Care | Payer: Self-pay | Source: Ambulatory Visit | Attending: Obstetrics and Gynecology

## 2018-01-15 ENCOUNTER — Encounter (HOSPITAL_COMMUNITY): Payer: Self-pay | Admitting: *Deleted

## 2018-01-15 DIAGNOSIS — N840 Polyp of corpus uteri: Secondary | ICD-10-CM | POA: Diagnosis not present

## 2018-01-15 DIAGNOSIS — K219 Gastro-esophageal reflux disease without esophagitis: Secondary | ICD-10-CM | POA: Insufficient documentation

## 2018-01-15 DIAGNOSIS — D250 Submucous leiomyoma of uterus: Secondary | ICD-10-CM | POA: Insufficient documentation

## 2018-01-15 DIAGNOSIS — I10 Essential (primary) hypertension: Secondary | ICD-10-CM | POA: Diagnosis not present

## 2018-01-15 DIAGNOSIS — F419 Anxiety disorder, unspecified: Secondary | ICD-10-CM | POA: Diagnosis not present

## 2018-01-15 DIAGNOSIS — N841 Polyp of cervix uteri: Secondary | ICD-10-CM | POA: Insufficient documentation

## 2018-01-15 DIAGNOSIS — Z79899 Other long term (current) drug therapy: Secondary | ICD-10-CM | POA: Insufficient documentation

## 2018-01-15 HISTORY — DX: Personal history of other diseases of the digestive system: Z87.19

## 2018-01-15 HISTORY — PX: CERVICAL POLYPECTOMY: SHX88

## 2018-01-15 HISTORY — PX: HYSTEROSCOPY WITH D & C: SHX1775

## 2018-01-15 LAB — HCG, SERUM, QUALITATIVE: Preg, Serum: NEGATIVE

## 2018-01-15 SURGERY — DILATATION AND CURETTAGE /HYSTEROSCOPY
Anesthesia: General

## 2018-01-15 MED ORDER — PROPOFOL 10 MG/ML IV BOLUS
INTRAVENOUS | Status: DC | PRN
  Administered 2018-01-15: 200 mg via INTRAVENOUS
  Administered 2018-01-15: 100 mg via INTRAVENOUS

## 2018-01-15 MED ORDER — FENTANYL CITRATE (PF) 100 MCG/2ML IJ SOLN
25.0000 ug | INTRAMUSCULAR | Status: DC | PRN
  Administered 2018-01-15 (×2): 50 ug via INTRAVENOUS

## 2018-01-15 MED ORDER — MIDAZOLAM HCL 2 MG/2ML IJ SOLN
INTRAMUSCULAR | Status: AC
  Filled 2018-01-15: qty 2

## 2018-01-15 MED ORDER — ONDANSETRON HCL 4 MG/2ML IJ SOLN
INTRAMUSCULAR | Status: AC
  Filled 2018-01-15: qty 2

## 2018-01-15 MED ORDER — CLINDAMYCIN PHOSPHATE 900 MG/50ML IV SOLN: 900.0000 mg | INTRAVENOUS | Status: DC

## 2018-01-15 MED ORDER — LACTATED RINGERS IV SOLN
INTRAVENOUS | Status: DC
  Administered 2018-01-15 (×2): 1000 mL via INTRAVENOUS

## 2018-01-15 MED ORDER — LIDOCAINE HCL (CARDIAC) PF 100 MG/5ML IV SOSY
PREFILLED_SYRINGE | INTRAVENOUS | Status: AC
  Filled 2018-01-15: qty 5

## 2018-01-15 MED ORDER — FENTANYL CITRATE (PF) 100 MCG/2ML IJ SOLN
INTRAMUSCULAR | Status: AC
  Filled 2018-01-15: qty 2

## 2018-01-15 MED ORDER — PROPOFOL 10 MG/ML IV BOLUS
INTRAVENOUS | Status: AC
  Filled 2018-01-15: qty 40

## 2018-01-15 MED ORDER — SODIUM CHLORIDE 0.9 % IR SOLN
Status: DC | PRN
  Administered 2018-01-15 (×3): 3000 mL

## 2018-01-15 MED ORDER — LIDOCAINE-EPINEPHRINE 1 %-1:100000 IJ SOLN
INTRAMUSCULAR | Status: DC | PRN
  Administered 2018-01-15: 20 mL

## 2018-01-15 MED ORDER — FENTANYL CITRATE (PF) 100 MCG/2ML IJ SOLN
INTRAMUSCULAR | Status: DC | PRN
  Administered 2018-01-15 (×2): 100 ug via INTRAVENOUS

## 2018-01-15 MED ORDER — ONDANSETRON HCL 4 MG/2ML IJ SOLN
INTRAMUSCULAR | Status: DC | PRN
  Administered 2018-01-15: 4 mg via INTRAVENOUS

## 2018-01-15 MED ORDER — CIPROFLOXACIN IN D5W 400 MG/200ML IV SOLN: 400.0000 mg | INTRAVENOUS | Status: DC

## 2018-01-15 MED ORDER — LIDOCAINE HCL (CARDIAC) PF 100 MG/5ML IV SOSY
PREFILLED_SYRINGE | INTRAVENOUS | Status: DC | PRN
  Administered 2018-01-15: 100 mg via INTRAVENOUS

## 2018-01-15 MED ORDER — PHENYLEPHRINE HCL 10 MG/ML IJ SOLN
INTRAMUSCULAR | Status: DC | PRN
  Administered 2018-01-15 (×2): 80 ug via INTRAVENOUS

## 2018-01-15 MED ORDER — GENTAMICIN SULFATE 40 MG/ML IJ SOLN
INTRAMUSCULAR | Status: AC
  Administered 2018-01-15: 118 mg via INTRAVENOUS
  Filled 2018-01-15: qty 12

## 2018-01-15 MED ORDER — MEPERIDINE HCL 25 MG/ML IJ SOLN: 6.2500 mg | INTRAMUSCULAR | Status: DC | PRN

## 2018-01-15 MED ORDER — PROMETHAZINE HCL 25 MG/ML IJ SOLN: 6.2500 mg | INTRAMUSCULAR | Status: DC | PRN

## 2018-01-15 MED ORDER — MIDAZOLAM HCL 2 MG/2ML IJ SOLN: 0.5000 mg | Freq: Once | INTRAMUSCULAR | Status: DC | PRN

## 2018-01-15 MED ORDER — MIDAZOLAM HCL 2 MG/2ML IJ SOLN
INTRAMUSCULAR | Status: DC | PRN
  Administered 2018-01-15: 2 mg via INTRAVENOUS

## 2018-01-15 MED ORDER — LIDOCAINE-EPINEPHRINE 1 %-1:100000 IJ SOLN
INTRAMUSCULAR | Status: AC
  Filled 2018-01-15: qty 1

## 2018-01-15 SURGICAL SUPPLY — 20 items
BIPOLAR CUTTING LOOP 21FR (ELECTRODE) ×1
CANISTER SUCT 3000ML PPV (MISCELLANEOUS) ×3 IMPLANT
CATH ROBINSON RED A/P 16FR (CATHETERS) ×3 IMPLANT
DEVICE MYOSURE REACH (MISCELLANEOUS) ×2 IMPLANT
ELECT REM PT RETURN 9FT ADLT (ELECTROSURGICAL)
ELECTRODE REM PT RTRN 9FT ADLT (ELECTROSURGICAL) IMPLANT
GLOVE BIO SURGEON STRL SZ7 (GLOVE) ×3 IMPLANT
GLOVE BIOGEL PI IND STRL 7.0 (GLOVE) ×2 IMPLANT
GLOVE BIOGEL PI INDICATOR 7.0 (GLOVE) ×1
GOWN STRL REUS W/TWL LRG LVL3 (GOWN DISPOSABLE) ×6 IMPLANT
LOOP CUTTING BIPOLAR 21FR (ELECTRODE) ×2 IMPLANT
MYOSURE XL FIBROID REM (MISCELLANEOUS) ×3
NDL SPNL 20GX3.5 QUINCKE YW (NEEDLE) ×2 IMPLANT
NEEDLE SPNL 20GX3.5 QUINCKE YW (NEEDLE) ×3 IMPLANT
PACK VAGINAL MINOR WOMEN LF (CUSTOM PROCEDURE TRAY) ×3 IMPLANT
PAD OB MATERNITY 4.3X12.25 (PERSONAL CARE ITEMS) ×3 IMPLANT
SYSTEM TISS REMOVAL MYSR XL RM (MISCELLANEOUS) IMPLANT
TOWEL OR 17X24 6PK STRL BLUE (TOWEL DISPOSABLE) ×6 IMPLANT
TUBING AQUILEX INFLOW (TUBING) ×3 IMPLANT
TUBING AQUILEX OUTFLOW (TUBING) ×3 IMPLANT

## 2018-01-15 NOTE — Op Note (Signed)
[name], [name] MEDICAL RECORD [id number] ACCOUNT [id number] DATE OF BIRTH:[birth year] FACILITY: [place] LOCATION: [facility] PHYSICIAN:Laketha Leopard Sherran Needs, MD  OPERATIVE REPORT  DATE OF PROCEDURE:  01/15/2018  PREOPERATIVE DIAGNOSIS:  Abnormal uterine bleeding with evidence of an endometrial polyp.  POSTOPERATIVE DIAGNOSIS:  Abnormal uterine bleeding with evidence of an endometrial polyp, an endocervical polyp, and a submucosal fibroid.  OPERATIVE PROCEDURE:  Paracervical block, cervical dilation, hysteroscopy with MyoSure resection of multiple endometrial polyps as well as a submucosal fibroid.  We also removed an endocervical polyp.  This was followed by endometrial curettings.  SURGEON:  Arvella Nigh, MD  ANESTHESIA:  General plus paracervical block.  ESTIMATED BLOOD LOSS:  Probably 100 mL.  Total deficit was 1800 mL.  PACKS AND DRAINS:  None.  INTRAOPERATIVE BLOOD PLACED:  None.  COMPLICATIONS:  None.  INDICATIONS:  Will be dictated in the History and Physical.  PROCEDURE:  The patient was taken to the OR and placed in supine position.  After satisfactory level of general anesthesia was obtained, she was placed in the dorsal lithotomy position using the Allen stirrups.  Perineum and vagina were prepped out with  Betadine.  Bladder was entered via catheterization.  Speculum was placed in the vaginal vault.  The cervix was grasped with single-tooth tenaculum.  A parous endocervical polyp was noted and removed using ring forceps and sent for pathology.  A  paracervical block with 1% Xylocaine was instituted.  Uterus sounded to approximately 10 cm.  Cervix serially dilated.  Hysteroscope was introduced into the uterine cavity then using saline.  Visualization revealed multiple endometrial polyps.  The  MyoSure was brought in place.  All the endometrial polyps were completely resected.  Then we could see at the fundus a submucosal fibroid.  We continued trying to resect with the  smaller MyoSure.  We were not making much progress.  We therefore took out  the smaller hysteroscope and got the larger hysteroscope introduced and then got the larger MyoSure.  With this, we were able to resect the majority of the fibroids.  Then the MyoSure quit functioning.  We went back to the smaller MyoSure and completed  resection of the submucosal fibroids.  Then the procedure of the fibroids had been adequately resected.  All polyps were removed.  The endocervical polyp was also removed.  At this point, hysteroscope deficit was _____ mL.  Endometrial curettings were also obtained.  Sponge and needle count  was correct by circulating nurse x2.  The patient tolerated the procedure well.  Once alert and extubated, transferred to recovery room in good condition.  LN/NUANCE  D:01/15/2018 T:01/15/2018 JOB:[id number]

## 2018-01-15 NOTE — Anesthesia Preprocedure Evaluation (Addendum)
Anesthesia Evaluation  Patient identified by MRN, date of birth, ID band Patient awake    Reviewed: Allergy & Precautions, NPO status , Patient's Chart, lab work & pertinent test results  History of Anesthesia Complications Negative for: history of anesthetic complications  Airway Mallampati: II  TM Distance: >3 FB Neck ROM: Full    Dental  (+) Dental Advisory Given, Poor Dentition   Pulmonary neg pulmonary ROS,    breath sounds clear to auscultation       Cardiovascular hypertension, Pt. on medications (-) angina Rhythm:Regular Rate:Normal  1/19 ECHO: EF 65-70%, valves OK   Neuro/Psych  Headaches, Anxiety Depression Bipolar Disorder    GI/Hepatic negative GI ROS, Neg liver ROS, neg GERD  ,  Endo/Other  Morbid obesityPCOS  Renal/GU negative Renal ROS     Musculoskeletal   Abdominal (+) + obese,   Peds  Hematology negative hematology ROS (+)   Anesthesia Other Findings Pt c/o calf tenderness and swelling, Dr Radene Knee eval and wishes to proceed  Reproductive/Obstetrics                            Anesthesia Physical Anesthesia Plan  ASA: III  Anesthesia Plan: General   Post-op Pain Management:    Induction: Intravenous  PONV Risk Score and Plan: 3 and Ondansetron, Dexamethasone and Scopolamine patch - Pre-op  Airway Management Planned: LMA  Additional Equipment:   Intra-op Plan:   Post-operative Plan:   Informed Consent: I have reviewed the patients History and Physical, chart, labs and discussed the procedure including the risks, benefits and alternatives for the proposed anesthesia with the patient or authorized representative who has indicated his/her understanding and acceptance.   Dental advisory given  Plan Discussed with: CRNA and Surgeon  Anesthesia Plan Comments: (Plan routine monitors, GA- LMA ok)        Anesthesia Quick Evaluation

## 2018-01-15 NOTE — Transfer of Care (Signed)
Immediate Anesthesia Transfer of Care Note  Patient: Christine [name]  Procedure(s) Performed: DILATATION AND CURETTAGE /HYSTEROSCOPY WITH MYOSURE (N/A ) CERVICAL POLYPECTOMY  Patient Location: PACU  Anesthesia Type:General  Level of Consciousness: awake, alert  and oriented  Airway & Oxygen Therapy: Patient Spontanous Breathing, Patient connected to nasal cannula oxygen and Patient connected to face mask oxygen  Post-op Assessment: Report given to RN and Post -op Vital signs reviewed and stable  Post vital signs: Reviewed and stable  Last Vitals:  Vitals Value Taken Time  BP 159/142 01/15/2018  8:35 AM  Temp    Pulse 94 01/15/2018  8:37 AM  Resp 11 01/15/2018  8:37 AM  SpO2 93 % 01/15/2018  8:37 AM  Vitals shown include unvalidated device data.  Last Pain:  Vitals:   01/15/18 0636  TempSrc: Oral      Patients Stated Pain Goal: 3 (10/23/20 4825)  Complications: No apparent anesthesia complications

## 2018-01-15 NOTE — Discharge Instructions (Signed)
DISCHARGE INSTRUCTIONS: HYSTEROSCOPY / ENDOMETRIAL ABLATION The following instructions have been prepared to help you care for yourself upon your return home.  May take stool softner while taking narcotic pain medication to prevent constipation.  Drink plenty of water.  Personal hygiene:  Use sanitary pads for vaginal drainage, not tampons.  Shower the day after your procedure.  NO tub baths, pools or Jacuzzis for 2-3 weeks.  Wipe front to back after using the bathroom.  Activity and limitations:  Do NOT drive or operate any equipment for 24 hours. The effects of anesthesia are still present and drowsiness may result.  Do NOT rest in bed all day.  Walking is encouraged.  Walk up and down stairs slowly.  You may resume your normal activity in one to two days or as indicated by your physician. Sexual activity: NO intercourse for at least 2 weeks after the procedure, or as indicated by your Doctor.  Diet: Eat a light meal as desired this evening. You may resume your usual diet tomorrow.  Return to Work: You may resume your work activities in one to two days or as indicated by Marine scientist.  What to expect after your surgery: Expect to have vaginal bleeding/discharge for 2-3 days and spotting for up to 10 days. It is not unusual to have soreness for up to 1-2 weeks. You may have a slight burning sensation when you urinate for the first day. Mild cramps may continue for a couple of days. You may have a regular period in 2-6 weeks.  Call your doctor for any of the following:  Excessive vaginal bleeding or clotting, saturating and changing one pad every hour.  Inability to urinate 6 hours after discharge from hospital.  Pain not relieved by pain medication.  Fever of 100.4 F or greater.  Unusual vaginal discharge or odor.  Return to office _________________Call for an appointment ___________________ Patients signature: ______________________ Nurses signature  ________________________  Post Anesthesia Care Unit [phone]   Post Anesthesia Home Care Instructions  Activity: Get plenty of rest for the remainder of the day. A responsible individual must stay with you for 24 hours following the procedure.  For the next 24 hours, DO NOT: -Drive a car -Paediatric nurse -Drink alcoholic beverages -Take any medication unless instructed by your physician -Make any legal decisions or sign important papers.  Meals: Start with liquid foods such as gelatin or soup. Progress to regular foods as tolerated. Avoid greasy, spicy, heavy foods. If nausea and/or vomiting occur, drink only clear liquids until the nausea and/or vomiting subsides. Call your physician if vomiting continues.  Special Instructions/Symptoms: Your throat may feel dry or sore from the anesthesia or the breathing tube placed in your throat during surgery. If this causes discomfort, gargle with warm salt water. The discomfort should disappear within 24 hours.

## 2018-01-15 NOTE — Anesthesia Postprocedure Evaluation (Signed)
Anesthesia Post Note  Patient: Velvia Mehrer  Procedure(s) Performed: DILATATION AND CURETTAGE /HYSTEROSCOPY WITH MYOSURE (N/A ) CERVICAL POLYPECTOMY     Patient location during evaluation: PACU Anesthesia Type: General Level of consciousness: awake and alert, oriented and patient cooperative Pain management: pain level controlled Vital Signs Assessment: post-procedure vital signs reviewed and stable Respiratory status: spontaneous breathing, nonlabored ventilation and respiratory function stable Cardiovascular status: blood pressure returned to baseline and stable Postop Assessment: no apparent nausea or vomiting Anesthetic complications: no    Last Vitals:  Vitals:   01/15/18 1045 01/15/18 1100  BP: (!) 136/58   Pulse: 86 82  Resp: (!) 28 15  Temp:  37.1 C  SpO2: 99% 94%    Last Pain:  Vitals:   01/15/18 1100  TempSrc:   PainSc: 3    Pain Goal: Patients Stated Pain Goal: 3 (01/15/18 1100)               Naileah Karg,E. Ariellah Faust

## 2018-01-15 NOTE — H&P (Signed)
  History and physical exam unchanged 

## 2018-01-15 NOTE — Brief Op Note (Signed)
01/15/2018  8:34 AM  PATIENT:  Louis Meckel  42 y.o. female  PRE-OPERATIVE DIAGNOSIS:  POLYP  POST-OPERATIVE DIAGNOSIS:  POLYP  PROCEDURE:  Procedure(s): DILATATION AND CURETTAGE /HYSTEROSCOPY WITH MYOSURE (N/A) CERVICAL POLYPECTOMY  SURGEON:  Surgeon(s) and Role:    * Alitzel Cookson, MD - Primary  PHYSICIAN ASSISTANT:   ASSISTANTS: none   ANESTHESIA:   general and paracervical block  EBL:  5 mL   BLOOD ADMINISTERED:none  DRAINS: none   LOCAL MEDICATIONS USED:  XYLOCAINE   SPECIMEN:  Source of Specimen:  endocervical polyp. endometrial polyps. submucosal fibroid  DISPOSITION OF SPECIMEN:  PATHOLOGY  COUNTS:  YES  TOURNIQUET:  * No tourniquets in log *  DICTATION: .Other Dictation: Dictation Number [id number]  PLAN OF CARE: Discharge to home after PACU  PATIENT DISPOSITION:  PACU - hemodynamically stable.   Delay start of Pharmacological VTE agent (>24hrs) due to surgical blood loss or risk of bleeding: not applicable

## 2018-01-15 NOTE — Anesthesia Procedure Notes (Signed)
Procedure Name: LMA Insertion Date/Time: 01/15/2018 8:21 AM Performed by: Genevie Ann, CRNA Pre-anesthesia Checklist: Patient identified, Emergency Drugs available, Suction available, Patient being monitored and Timeout performed Patient Re-evaluated:Patient Re-evaluated prior to induction Oxygen Delivery Method: Circle system utilized Preoxygenation: Pre-oxygenation with 100% oxygen Induction Type: IV induction LMA: LMA inserted LMA Size: 4.0 Number of attempts: 1 ETT to lip (cm): at lip. Dental Injury: Teeth and Oropharynx as per pre-operative assessment

## 2018-01-16 ENCOUNTER — Encounter (HOSPITAL_COMMUNITY): Payer: Self-pay | Admitting: Obstetrics and Gynecology

## 2018-01-19 ENCOUNTER — Ambulatory Visit (INDEPENDENT_AMBULATORY_CARE_PROVIDER_SITE_OTHER): Payer: BC Managed Care – PPO | Admitting: Licensed Clinical Social Worker

## 2018-01-19 ENCOUNTER — Encounter (HOSPITAL_COMMUNITY): Payer: Self-pay | Admitting: Licensed Clinical Social Worker

## 2018-01-19 DIAGNOSIS — F3162 Bipolar disorder, current episode mixed, moderate: Secondary | ICD-10-CM

## 2018-01-19 NOTE — Progress Notes (Signed)
   THERAPIST PROGRESS NOTE  Session Time: 11:10-12pm  Participation Level: Active  Behavioral Response: CasualAlert/Euthymic  Type of Therapy: Individual Therapy  Treatment Goals addressed: Coping  Interventions:  CBT/TX plan review  Summary: Christine [name] is a 42 y.o. female who presents for her individual counseling session. Pt discussed her psychiatric symptoms and current life events. Pt reports she just had gynological  Surgery Thursday. Pt discussed having the surgery and the discussion she had with her OB-GYN. Pt is considering hysterectomy, adoption, birthing a child. Asked open ended questions and used empathic reflection. Encouraged pt to continue an open dialogue with her doc and her husband about this major decision. Pt was rather upbeat today, better than Ive seen her in a while. She reports she has been having less depressed days. Pt's disability hearing is next month. Processed with expectations of hearing, results, next steps. Reviewed tx plan with pt and discussed goals and strategies.   Suicidal/Homicidal: Nowithout intent/plan  Therapist Response: Assessed pt's current function by self report and reviewed progress. Assisted pt processing her psychiatric symptoms,  current moods, health issues, upcoming disability hearing, tx plan review.  Assisted pt processing for the management of her stressors.   Plan: Return again in 2 weeks.  Diagnosis: Axis I:  Bipolar 1 Disorder, mixed moderate    Rehan Holness S, LCAS 01/19/2018

## 2018-01-20 ENCOUNTER — Inpatient Hospital Stay: Payer: BC Managed Care – PPO | Attending: Hematology and Oncology

## 2018-01-20 DIAGNOSIS — D500 Iron deficiency anemia secondary to blood loss (chronic): Secondary | ICD-10-CM

## 2018-01-20 DIAGNOSIS — Z79899 Other long term (current) drug therapy: Secondary | ICD-10-CM | POA: Insufficient documentation

## 2018-01-20 DIAGNOSIS — I10 Essential (primary) hypertension: Secondary | ICD-10-CM | POA: Diagnosis not present

## 2018-01-20 DIAGNOSIS — N920 Excessive and frequent menstruation with regular cycle: Secondary | ICD-10-CM | POA: Diagnosis present

## 2018-01-20 DIAGNOSIS — D259 Leiomyoma of uterus, unspecified: Secondary | ICD-10-CM | POA: Diagnosis not present

## 2018-01-20 DIAGNOSIS — F319 Bipolar disorder, unspecified: Secondary | ICD-10-CM | POA: Insufficient documentation

## 2018-01-20 LAB — CMP (CANCER CENTER ONLY)
ALT: 13 U/L (ref 0–55)
AST: 10 U/L (ref 5–34)
Albumin: 4 g/dL (ref 3.5–5.0)
Alkaline Phosphatase: 70 U/L (ref 40–150)
Anion gap: 8 (ref 3–11)
BUN: 8 mg/dL (ref 7–26)
CO2: 23 mmol/L (ref 22–29)
Calcium: 9.6 mg/dL (ref 8.4–10.4)
Chloride: 109 mmol/L (ref 98–109)
Creatinine: 0.87 mg/dL (ref 0.60–1.10)
GFR, Est AFR Am: >60 mL/min (ref >60)
GFR, Estimated: >60 mL/min (ref >60)
Glucose, Bld: 93 mg/dL (ref 70–140)
Potassium: 4 mmol/L (ref 3.5–5.1)
Sodium: 140 mmol/L (ref 136–145)
Total Bilirubin: 0.3 mg/dL (ref 0.2–1.2)
Total Protein: 7.4 g/dL (ref 6.4–8.3)

## 2018-01-20 LAB — CBC WITH DIFFERENTIAL (CANCER CENTER ONLY)
Basophils Absolute: 0 10*3/uL (ref 0.0–0.1)
Basophils Relative: 0 %
Eosinophils Absolute: 0.1 10*3/uL (ref 0.0–0.5)
Eosinophils Relative: 1 %
HCT: 37 % (ref 34.8–46.6)
Hemoglobin: 11.3 g/dL — ABNORMAL LOW (ref 11.6–15.9)
Lymphocytes Relative: 23 %
Lymphs Abs: 1.8 10*3/uL (ref 0.9–3.3)
MCH: 27.4 pg (ref 25.1–34.0)
MCHC: 30.5 g/dL — ABNORMAL LOW (ref 31.5–36.0)
MCV: 89.6 fL (ref 79.5–101.0)
Monocytes Absolute: 0.6 10*3/uL (ref 0.1–0.9)
Monocytes Relative: 8 %
Neutro Abs: 5.2 10*3/uL (ref 1.5–6.5)
Neutrophils Relative %: 68 %
Platelet Count: 313 10*3/uL (ref 145–400)
RBC: 4.13 MIL/uL (ref 3.70–5.45)
RDW: 17.1 % — ABNORMAL HIGH (ref 11.2–14.5)
WBC Count: 7.8 10*3/uL (ref 3.9–10.3)

## 2018-01-20 LAB — IRON AND TIBC
Iron: 29 ug/dL — ABNORMAL LOW (ref 41–142)
Saturation Ratios: 9 % — ABNORMAL LOW (ref 21–57)
TIBC: 335 ug/dL (ref 236–444)
UIBC: 306 ug/dL

## 2018-01-20 LAB — FERRITIN: Ferritin: 24 ng/mL (ref 9–269)

## 2018-01-22 ENCOUNTER — Telehealth: Payer: Self-pay

## 2018-01-22 ENCOUNTER — Encounter: Payer: Self-pay | Admitting: Nurse Practitioner

## 2018-01-22 ENCOUNTER — Inpatient Hospital Stay (HOSPITAL_BASED_OUTPATIENT_CLINIC_OR_DEPARTMENT_OTHER): Payer: BC Managed Care – PPO | Admitting: Nurse Practitioner

## 2018-01-22 ENCOUNTER — Inpatient Hospital Stay: Payer: BC Managed Care – PPO

## 2018-01-22 VITALS — BP 136/78 | HR 92 | Temp 98.6°F | Resp 18

## 2018-01-22 VITALS — BP 178/76 | HR 101 | Temp 99.7°F | Resp 18 | Ht 64.0 in | Wt 343.0 lb

## 2018-01-22 DIAGNOSIS — N920 Excessive and frequent menstruation with regular cycle: Secondary | ICD-10-CM | POA: Diagnosis not present

## 2018-01-22 DIAGNOSIS — D500 Iron deficiency anemia secondary to blood loss (chronic): Secondary | ICD-10-CM | POA: Diagnosis not present

## 2018-01-22 DIAGNOSIS — D259 Leiomyoma of uterus, unspecified: Secondary | ICD-10-CM | POA: Diagnosis not present

## 2018-01-22 DIAGNOSIS — I10 Essential (primary) hypertension: Secondary | ICD-10-CM | POA: Diagnosis not present

## 2018-01-22 DIAGNOSIS — F319 Bipolar disorder, unspecified: Secondary | ICD-10-CM

## 2018-01-22 DIAGNOSIS — Z79899 Other long term (current) drug therapy: Secondary | ICD-10-CM | POA: Diagnosis not present

## 2018-01-22 MED ORDER — DIPHENHYDRAMINE HCL 50 MG/ML IJ SOLN
12.5000 mg | Freq: Once | INTRAMUSCULAR | Status: AC
  Administered 2018-01-22: 12.5 mg via INTRAVENOUS

## 2018-01-22 MED ORDER — SODIUM CHLORIDE 0.9 % IV SOLN
Freq: Once | INTRAVENOUS | Status: AC
  Administered 2018-01-22: 11:00:00 via INTRAVENOUS

## 2018-01-22 MED ORDER — FAMOTIDINE IN NACL 20-0.9 MG/50ML-% IV SOLN
INTRAVENOUS | Status: AC
Start: 2018-01-22 — End: ?
  Filled 2018-01-22: qty 50

## 2018-01-22 MED ORDER — DIPHENHYDRAMINE HCL 50 MG/ML IJ SOLN
INTRAMUSCULAR | Status: AC
  Filled 2018-01-22: qty 1

## 2018-01-22 MED ORDER — FAMOTIDINE IN NACL 20-0.9 MG/50ML-% IV SOLN
20.0000 mg | Freq: Once | INTRAVENOUS | Status: AC
  Administered 2018-01-22: 20 mg via INTRAVENOUS

## 2018-01-22 MED ORDER — SODIUM CHLORIDE 0.9 % IV SOLN
510.0000 mg | Freq: Once | INTRAVENOUS | Status: AC
  Administered 2018-01-22: 510 mg via INTRAVENOUS
  Filled 2018-01-22: qty 17

## 2018-01-22 NOTE — Patient Instructions (Signed)

## 2018-01-22 NOTE — Progress Notes (Addendum)
[place]  [phone] [phone]) [phone]  Clinic Follow up Note   Patient Care Team: Scifres, Durel Salts as PCP - General (Physician Assistant) 01/22/2018  DIAGNOSIS: Iron deficiency anemia  CURRENT THERAPY:  RBC transfusion x1 08/2017; IV Feraheme 08/2017, 09/2017, 10/2017, 12/2017, 01/2018   HISTORY OF PRESENT ILLNESS  The patient was previously seen by Dr. Lebron Conners for severe microcytic, hypochronic anemia with iron depletion secondary to chronic blood loss related to menorrhagia and uterine fibroids.  Review of historical records indicates abnormal CBC dating back to 01/20/2010 with Hgb initially 9.4.  There are periodic gaps in her records. She presented to the ER for symptomatic anemia on 08/29/2017, Hgb found to be 6.6; B12 and folate were normal; iron studies at that time found ferritin of 2, serum iron 28, TIBC 428, and 7% saturation, consistent with iron deficiency.  She was transfused 3 units RBCs and received 1 dose IV Feraheme. Hemoglobin improved to 9.0 at time of discharge. She endorses being unable to tolerate oral iron in the past, causing diarrhea and vomiting. Iron studies improved on 09/16/2017 and again on 10/13/17 with ferritin 44, serum iron 41, and 14% saturation. She received additional IV Feraheme on 09/24/17 and 10/15/17. By 12/15/17 ferritin dropped again to 6, serum iron 27, and 7% sat. She received 1 additional dose of IV Feraheme on 12/18/17. She underwent D&C/hysteroscopy and cervical polypectomy per Dr. Radene Knee Ob/gyn on 01/15/18. She endorses 2 other gyn procedures in the past related to fibroids and heavy bleeding. She tried IUD for bleeding control which was ineffective and eventually removed.   She uses NSAIDs frequently for various aches and pains such as arthritic pain, headaches, and pelvic cramps. She has tried to cut back. Not on anticoagulation. She had colonoscopy in the past for remote history of rectal bleeding, she was found to have  hemorrhoids. She is due for another colonoscopy, GI is Dr. Paulita Fujita. Denies Pica. Denies personal history of cancer or clotting disorder. She endorses getting weekly IV iron x6 weeks while living in [place], [place].   PMH is significant for fibroids, PCOS, menorrhagia, bipolar disorder, HTN, obesity, recurrent first trimester miscarriages.  Family history of cancer is positive for father with colon cancer and brother with leukemia diagnosed at age 5. She is married, lives with spouse and 2 adopted children. She is not working, on disability due to bipolar disorder. She sees mental health counselor and mood is "somewhat stable." She feels safe in her home, is independent of ADLs and drives herself.   GYN HISTORY  Menarchal: age 14, occur monthly x7 days, heavy  LMP: current Contraceptive: on birth control pills to control vaginal bleeding; used remotely for pregnany prevention for short time HRT: none  G4P0: 4 early first trimester miscarriages between 5-9 weeks    INTERVAL HISTORY: Today she reports fatigue but is able to manage ADLs. Has occasional chest tightness, dyspnea on exertion, and headaches but these are becoming less frequent. She does not notice much improvement in her symptoms with monthly feraheme. She recently underwent GYN procedure on 01/15/18. She had light spotting afterwards that has progressed to heavy bleeding. She saw GYN this week who prescribed birth control pills BID to control bleeding. She plans to call their office again to discuss duration of therapy. Denies other bleeding such as hematochezia, hematuria, or epistaxis.    REVIEW OF SYSTEMS:   Constitutional: Denies fevers, chills or abnormal weight loss (+) fatigue  Eyes: Denies blurriness of vision Ears, nose,  mouth, throat, and face: Denies mucositis, sore throat, or epistaxis  Respiratory: Denies cough or wheezes (+) DOE Cardiovascular: Denies palpitation or lower extremity swelling (+) intermittent chest tightness    Gastrointestinal:  Denies nausea, vomiting, constipation, diarrhea, hematochezia, heartburn or change in bowel habits GU/GYN: Denies hematuria (+) vaginal bleeding (+) intermittent pelvic cramps  Skin: Denies abnormal skin rashes Lymphatics: Denies new lymphadenopathy or easy bruising Neurological:Denies numbness, tingling or new weaknesses (+) intermittent headaches, less frequent  Behavioral/Psych: Mood is stable, no new changes (+) Bipolar disorder, managed by [facility] provider  All other systems were reviewed with the patient and are negative.  MEDICAL HISTORY:  Past Medical History:  Diagnosis Date  . Anemia   . Anxiety   . Arthritis    Hands, ankles  . Bipolar 1 disorder ([place])   . Depression   . Fibroids   . Headache   . High blood pressure   . History of blood transfusion 08/2017   [facility]  . History of hiatal hernia   . Mental disorder   . PCOS (polycystic ovarian syndrome)   . Seasonal allergies     SURGICAL HISTORY: Past Surgical History:  Procedure Laterality Date  . CERVICAL POLYPECTOMY  01/15/2018   Procedure: CERVICAL POLYPECTOMY;  Surgeon: Arvella Nigh, MD;  Location: [facility];  Service: Gynecology;;  . COLONOSCOPY  2008   Normal  . DIAGNOSTIC LAPAROSCOPY  08/1999   dermoid cyst, [facility]  . DILATION AND CURETTAGE OF UTERUS  05/2004   MAB  . DILATION AND CURETTAGE OF UTERUS  04/2015  . HYSTEROSCOPY W/D&C  07/22/2011   Procedure: DILATATION AND CURETTAGE /HYSTEROSCOPY;  Surgeon: Cyril Mourning, MD;  Location: [facility];  Service: Gynecology;;  . HYSTEROSCOPY W/D&C N/A 01/15/2018   Procedure: DILATATION AND CURETTAGE [place] WITH MYOSURE;  Surgeon: Arvella Nigh, MD;  Location: [facility];  Service: Gynecology;  Laterality: N/A;    I have reviewed the social history and family history with the patient and they are unchanged from previous note.  ALLERGIES:  is allergic to penicillins.  MEDICATIONS:  Current Outpatient Medications  Medication Sig Dispense Refill  .  amLODipine (NORVASC) 10 MG tablet Take 1 tablet (10 mg total) by mouth daily. 30 tablet 0  . Brexpiprazole ([name]) 2 MG TABS Take 2 mg by mouth daily. 30 tablet 1  . clonazePAM (KLONOPIN) 0.5 MG tablet Take 1 tablet (0.5 mg total) by mouth 2 (two) times daily as needed. (Patient taking differently: Take 0.5 mg by mouth 2 (two) times daily as needed for anxiety. ) 30 tablet 1  . fluticasone (FLONASE) 50 MCG/ACT nasal spray Place 2 sprays into both nostrils daily as needed for allergies.     [name] lamoTRIgine (LAMICTAL) 150 MG tablet Take 2 tablets (300 mg total) by mouth daily. 60 tablet 1  . lithium carbonate (LITHOBID) 300 MG CR tablet Take 600 mg in am and 600 mg at pm and 300 mg at bed time (Patient taking differently: Take 300-600 mg by mouth See admin instructions. Take 600 mg in am and 600 mg at pm and 300 mg at bed time) 150 tablet 1  . meloxicam (MOBIC) 7.5 MG tablet Take 7.5 mg by mouth 2 (two) times daily as needed for pain.   2   No current facility-administered medications for this visit.    Facility-Administered Medications Ordered in Other Visits  Medication Dose Route Frequency Provider Last Rate Last Dose  . ferumoxytol (FERAHEME) 510 mg in sodium chloride 0.9 % 100 mL  IVPB  510 mg Intravenous Once Perlov, Marinell Blight, MD        PHYSICAL EXAMINATION: ECOG PERFORMANCE STATUS: 1 - Symptomatic but completely ambulatory  Vitals:   01/22/18 0925  BP: (!) 178/76  Pulse: (!) 101  Resp: 18  Temp: 99.7 F (37.6 C)  SpO2: 100%   Filed Weights   01/22/18 0925  Weight: (!) 343 lb (155.6 kg)    GENERAL:alert, no distress and comfortable SKIN:  no rashes or significant lesions EYES: normal, Conjunctiva are pink and non-injected, sclera clear OROPHARYNX:no thrush or ulcers LYMPH:  no palpable cervical or supraclavicular lymphadenopathy LUNGS: clear to auscultation with normal breathing effort HEART: regular rate & rhythm and no murmurs and no lower extremity edema ABDOMEN: obese.  abdomen soft, non-tender and normal bowel sounds.  Musculoskeletal:no cyanosis of digits and no clubbing  NEURO: alert & oriented x 3 with fluent speech, no focal motor/sensory deficits  LABORATORY DATA:  I have reviewed the data as listed CBC Latest Ref Rng & Units 01/20/2018 01/07/2018 02-Jan-2018  WBC 3.9 - 10.3 K/uL 7.8 9.4 8.4  Hemoglobin 11.6 - 15.9 g/dL 11.3(L) 11.0(L) 10.5(L)  Hematocrit 34.8 - 46.6 % 37.0 35.8(L) 32.8(L)  Platelets 145 - 400 K/uL 313 375 338     CMP Latest Ref Rng & Units 01/20/2018 01/07/2018 02-Jan-2018  Glucose 70 - 140 mg/dL 93 95 100  BUN 7 - 26 mg/dL 8 12 9   Creatinine 0.60 - 1.10 mg/dL 0.87 0.86 0.83  Sodium 136 - 145 mmol/L 140 139 140  Potassium 3.5 - 5.1 mmol/L 4.0 4.1 3.9  Chloride 98 - 109 mmol/L 109 107 109  CO2 22 - 29 mmol/L 23 22 25   Calcium 8.4 - 10.4 mg/dL 9.6 9.2 9.4  Total Protein 6.4 - 8.3 g/dL 7.4 - 7.3  Total Bilirubin 0.2 - 1.2 mg/dL 0.3 - 0.3  Alkaline Phos 40 - 150 U/L 70 - 63  AST 5 - 34 U/L 10 - 10  ALT 0 - 55 U/L 13 - 18   Iron studies  08/28/17 09/16/17 10/13/17 01/02/2018 01/20/18  Ferritin  2  27  44 6 24  Serum iron  28  30  41 27 29  TIBC 428  349 296 356 335  UIBC 400  319 256 330 306  % sat  7  9 14 7 9      RADIOGRAPHIC STUDIES: I have personally reviewed the radiological images as listed and agreed with the findings in the report. No results found.   ASSESSMENT & PLAN: Christine [name] is a pleasant 42 yo AAF with history of uterine fibroids, polyps, PCOS, obesity, HTN, and arthritis found to have severe iron deficiency anemia secondary to chronic blood loss   1. Iron deficiency anemia secondary to chronic blood loss  -We reviewed her medical record in detail with the patient she has a history of severe microcytic, hypochromic anemia with labs consistent with iron deficiency.  She does not tolerate oral iron due to diarrhea and vomiting.  She has received multiple IV iron treatments with improvement in her iron studies  although she does not maintain lasting response.  Her blood loss is attributed to vaginal bleeding and uterine fibroids.  She is currently on birth control to control her bleeding.  She maintains close follow-up with GYN. -Iron studies remain low today, Hgb stable 11.3; Dr. Burr Medico recommends IV Feraheme weekly x2 with first dose today. -Will monitor her labs monthly x3, follow-up in 3 months -Will arrange IV Feraheme  weekly x2 if her iron continues to drop  2.  HTN -BP is inadequately controlled, 178/76 today.  She is on amlodipine.  She was encouraged today to call PCP for follow-up and possible med adjustment  3.  Overall health and wellness -Tries to eat healthy and has restarted physical exercise with home videos, she knows the importance of living a healthy lifestyle -She is under the care of mental health provider for bipolar, on clonazepam, lamotrigine, and lithium; she reports her mood is mostly stable  PLAN: -Labs and medical record reviewed -Proceed with IV Feraheme weekly x2, one dose today -Labs monthly x3, will arrange additional IV Feraheme pending results  -F/u in 3 months  -Patient to maintain regular f/u with PCP, GYN for chronic conditions    Orders Placed This Encounter  Procedures  . CBC with Differential (Cancer Center Only)    Standing Status:   Standing    Number of Occurrences:   50    Standing Expiration Date:   01/23/2024  . CMP ([facility] only)    Standing Status:   Standing    Number of Occurrences:   50    Standing Expiration Date:   01/23/2024  . Iron and TIBC    Standing Status:   Standing    Number of Occurrences:   50    Standing Expiration Date:   01/23/2024  . Ferritin    Standing Status:   Standing    Number of Occurrences:   50    Standing Expiration Date:   01/23/2024   All questions were answered. The patient knows to call the clinic with any problems, questions or concerns. No barriers to learning was detected.     Alla Feeling,  NP 01/22/18   Addendum  I have seen the patient, examined her. I agree with the assessment and and plan and have edited the notes.   Pt was previously under Dr. Clydene Laming care, now transferring her care to me.  I have reviewed Ms. Amico's medical records including lab results, and discussed with pt. She has iron deficient anemia secondary to menorrhagia, she sees Dr. Radene Knee. She could not tolerated oral iron but has responded well to iv iron.  Due to her persistent mild anemia, and still low ferritin level, I recommend IV Feraheme weekly twice in the next 2 to 3 weeks.  We will try to keep her ferritin level above 50.will continue monitoring closely.  Truitt Merle  01/22/2018

## 2018-01-22 NOTE — Telephone Encounter (Signed)
1042-Called and left Christine [name] a message that we were ready for her in infusion.  We have called for her in the lobby several times and cannot currently locate her. Gardiner Rhyme

## 2018-01-22 NOTE — Telephone Encounter (Signed)
Printed avs and calender of upcoming appointment. Per 6/20 los 

## 2018-01-26 ENCOUNTER — Ambulatory Visit (HOSPITAL_COMMUNITY): Payer: Self-pay | Admitting: Licensed Clinical Social Worker

## 2018-01-29 ENCOUNTER — Ambulatory Visit (HOSPITAL_COMMUNITY)
Admission: RE | Admit: 2018-01-29 | Discharge: 2018-01-29 | Disposition: A | Discharge status: Home / Self Care | Payer: BC Managed Care – PPO | Source: Ambulatory Visit | Attending: Physician Assistant | Admitting: Physician Assistant

## 2018-01-29 ENCOUNTER — Ambulatory Visit (HOSPITAL_COMMUNITY): Payer: BC Managed Care – PPO | Admitting: Psychiatry

## 2018-01-29 DIAGNOSIS — D500 Iron deficiency anemia secondary to blood loss (chronic): Secondary | ICD-10-CM | POA: Insufficient documentation

## 2018-01-29 MED ORDER — FAMOTIDINE IN NACL 20-0.9 MG/50ML-% IV SOLN
20.0000 mg | Freq: Once | INTRAVENOUS | Status: AC
  Administered 2018-01-29: 20 mg via INTRAVENOUS
  Filled 2018-01-29: qty 50

## 2018-01-29 MED ORDER — SODIUM CHLORIDE 0.9 % IV SOLN
Freq: Once | INTRAVENOUS | Status: AC
  Administered 2018-01-29: 20 mL/h via INTRAVENOUS

## 2018-01-29 MED ORDER — SODIUM CHLORIDE 0.9 % IV SOLN
510.0000 mg | Freq: Once | INTRAVENOUS | Status: AC
  Administered 2018-01-29: 510 mg via INTRAVENOUS
  Filled 2018-01-29: qty 17

## 2018-01-29 MED ORDER — DIPHENHYDRAMINE HCL 50 MG/ML IJ SOLN
12.5000 mg | Freq: Once | INTRAMUSCULAR | Status: AC
  Administered 2018-01-29: 12.5 mg via INTRAVENOUS
  Filled 2018-01-29: qty 1

## 2018-01-29 NOTE — Discharge Instructions (Signed)

## 2018-01-29 NOTE — Progress Notes (Signed)
Patient received Feraheme via PIV. Patient compalained of a "slight headache" which she said was not too different from the "migraines" she sometimes get. Patient said she was fine. However, patient's provider Burr Medico MD) was notified. Per provider's nurse, patient can take her usual OTC medication for headache and if it worsens, she can call the their office back. This was communicated to the patient. Patient was observed for at least 30 minutes post infusion. Vitals were stable, discharge instructions were given. Patient alert, oriented and ambulatory at the time of discharge.

## 2018-02-02 ENCOUNTER — Ambulatory Visit (INDEPENDENT_AMBULATORY_CARE_PROVIDER_SITE_OTHER): Payer: BC Managed Care – PPO | Admitting: Licensed Clinical Social Worker

## 2018-02-02 ENCOUNTER — Encounter (HOSPITAL_COMMUNITY): Payer: Self-pay | Admitting: Licensed Clinical Social Worker

## 2018-02-02 DIAGNOSIS — F3162 Bipolar disorder, current episode mixed, moderate: Secondary | ICD-10-CM

## 2018-02-02 NOTE — Progress Notes (Signed)
   THERAPIST PROGRESS NOTE  Session Time: 9:10-10am  Participation Level: Active  Behavioral Response: CasualAlert/Depressed  Type of Therapy: Individual Therapy  Treatment Goals addressed: Coping  Interventions:  CBT  Summary: Christine [name] is a 42 y.o. female who presents for her individual counseling session. Pt discussed her psychiatric symptoms and current life events. Pt was tearful and expressed depressed mood. Discussed with pt her current coping tools that she has learned in IOP and PHP. Asked open ended questions and used empathic reflection. Discussed with pt"check the facts" when she has negative thoughts. Discussed again the importance of scheduling her time which will help with focus, accomplishment, feeling overwhelmed and self doubt. Discussed with pt the importance of gratitude, making a list daily. Discussed mindfulness activities to work on between sessions.   Suicidal/Homicidal: Nowithout intent/plan  Therapist Response: Assessed pt's current function by self report and reviewed progress. Assisted pt processing her psychiatric symptoms,  current moods, scheduling, mindfulness activities, gratitude.  Assisted pt processing for the management of her stressors.   Plan: Return again in 2 weeks.  Diagnosis: Axis I:  Bipolar 1 Disorder, mixed moderate    MACKENZIE,LISBETH S, LCAS 02/02/2018

## 2018-02-09 ENCOUNTER — Ambulatory Visit (HOSPITAL_COMMUNITY): Payer: Self-pay | Admitting: Licensed Clinical Social Worker

## 2018-02-10 ENCOUNTER — Other Ambulatory Visit: Payer: Self-pay

## 2018-02-12 ENCOUNTER — Ambulatory Visit: Payer: Self-pay | Admitting: Nurse Practitioner

## 2018-02-12 ENCOUNTER — Ambulatory Visit: Payer: Self-pay

## 2018-02-19 ENCOUNTER — Inpatient Hospital Stay: Payer: BC Managed Care – PPO | Attending: Hematology and Oncology

## 2018-02-23 ENCOUNTER — Ambulatory Visit (HOSPITAL_COMMUNITY): Payer: Self-pay | Admitting: Licensed Clinical Social Worker

## 2018-02-27 ENCOUNTER — Other Ambulatory Visit (HOSPITAL_COMMUNITY): Payer: Self-pay

## 2018-02-27 DIAGNOSIS — F3162 Bipolar disorder, current episode mixed, moderate: Secondary | ICD-10-CM

## 2018-02-27 MED ORDER — BREXPIPRAZOLE 2 MG PO TABS: 2.0000 mg | ORAL_TABLET | Freq: Every day | ORAL | 0 refills | Status: DC

## 2018-02-27 MED ORDER — LAMOTRIGINE 150 MG PO TABS: 300.0000 mg | ORAL_TABLET | Freq: Every day | ORAL | 0 refills | Status: DC

## 2018-02-27 MED ORDER — LITHIUM CARBONATE ER 300 MG PO TBCR: EXTENDED_RELEASE_TABLET | ORAL | 0 refills | Status: DC

## 2018-03-02 ENCOUNTER — Ambulatory Visit (HOSPITAL_COMMUNITY): Payer: Self-pay | Admitting: Licensed Clinical Social Worker

## 2018-03-09 ENCOUNTER — Ambulatory Visit (HOSPITAL_COMMUNITY): Payer: BC Managed Care – PPO | Admitting: Psychiatry

## 2018-03-16 ENCOUNTER — Other Ambulatory Visit (HOSPITAL_COMMUNITY): Payer: Self-pay

## 2018-03-16 ENCOUNTER — Encounter (HOSPITAL_COMMUNITY): Payer: Self-pay | Admitting: Licensed Clinical Social Worker

## 2018-03-16 ENCOUNTER — Ambulatory Visit (INDEPENDENT_AMBULATORY_CARE_PROVIDER_SITE_OTHER): Payer: BC Managed Care – PPO | Admitting: Licensed Clinical Social Worker

## 2018-03-16 DIAGNOSIS — F411 Generalized anxiety disorder: Secondary | ICD-10-CM

## 2018-03-16 DIAGNOSIS — F3162 Bipolar disorder, current episode mixed, moderate: Secondary | ICD-10-CM

## 2018-03-16 MED ORDER — CLONAZEPAM 0.5 MG PO TABS: 0.5000 mg | ORAL_TABLET | Freq: Two times a day (BID) | ORAL | 1 refills | Status: DC | PRN

## 2018-03-16 NOTE — Progress Notes (Signed)
   THERAPIST PROGRESS NOTE  Session Time: 10:10-11am  Participation Level: Active  Behavioral Response: CasualAlert/Manic  Type of Therapy: Individual Therapy  Treatment Goals addressed: Coping  Interventions:  CBT  Summary: Christine [name] is a 42 y.o. female who presents for her individual counseling session. Pt discussed her psychiatric symptoms and current life events. Pt presented manic today and didn't stop talking the whole session. She wandered over several topics describing her summer, resentments with husband, children visiting her mother for 3 weeks. Nothing apparently has changed financially and she shared she and her husband are not good with $. She has not heard from her disability hearing, but she and her husband are planning what to do with her disability back pay. Pt is struggling with some health issues (arthritis, inflammation). She is going to a doc today about pain in her leg. Tried to ask open ended questions but she talked right over me. Before she left, she shared she might be pregnant, asked open ended questions. Pt reports she is taking her medications and her psychiatrist is out of medical leave. Pt did not want to speak to [name] about medication.   Suicidal/Homicidal: Nowithout intent/plan  Therapist Response: Assessed pt's current function by self report and reviewed progress. Assisted pt processing her psychiatric symptoms,  current moods, summer, disability pending, relationships.  Assisted pt processing for the management of her stressors.   Plan: Return again in 2 weeks.  Diagnosis: Axis I:  Bipolar 1 Disorder, mixed moderate    MACKENZIE,LISBETH S, LCAS 03/16/2018

## 2018-03-19 ENCOUNTER — Inpatient Hospital Stay: Payer: BC Managed Care – PPO | Attending: Hematology and Oncology

## 2018-03-19 DIAGNOSIS — F319 Bipolar disorder, unspecified: Secondary | ICD-10-CM | POA: Insufficient documentation

## 2018-03-19 DIAGNOSIS — D500 Iron deficiency anemia secondary to blood loss (chronic): Secondary | ICD-10-CM | POA: Diagnosis not present

## 2018-03-19 DIAGNOSIS — Z79899 Other long term (current) drug therapy: Secondary | ICD-10-CM | POA: Diagnosis not present

## 2018-03-19 DIAGNOSIS — N920 Excessive and frequent menstruation with regular cycle: Secondary | ICD-10-CM | POA: Insufficient documentation

## 2018-03-19 DIAGNOSIS — I10 Essential (primary) hypertension: Secondary | ICD-10-CM | POA: Diagnosis not present

## 2018-03-19 DIAGNOSIS — D259 Leiomyoma of uterus, unspecified: Secondary | ICD-10-CM | POA: Insufficient documentation

## 2018-03-19 LAB — IRON AND TIBC
Iron: 52 ug/dL (ref 41–142)
Saturation Ratios: 17 % — ABNORMAL LOW (ref 21–57)
TIBC: 311 ug/dL (ref 236–444)
UIBC: 259 ug/dL

## 2018-03-19 LAB — CMP (CANCER CENTER ONLY)
ALT: 12 U/L (ref 0–44)
AST: 8 U/L — ABNORMAL LOW (ref 15–41)
Albumin: 3.9 g/dL (ref 3.5–5.0)
Alkaline Phosphatase: 66 U/L (ref 38–126)
Anion gap: 7 (ref 5–15)
BUN: 9 mg/dL (ref 6–20)
CO2: 26 mmol/L (ref 22–32)
Calcium: 9.3 mg/dL (ref 8.9–10.3)
Chloride: 107 mmol/L (ref 98–111)
Creatinine: 0.88 mg/dL (ref 0.44–1.00)
GFR, Est AFR Am: >60 mL/min (ref >60)
GFR, Estimated: >60 mL/min (ref >60)
Glucose, Bld: 81 mg/dL (ref 70–99)
Potassium: 4.2 mmol/L (ref 3.5–5.1)
Sodium: 140 mmol/L (ref 135–145)
Total Bilirubin: 0.4 mg/dL (ref 0.3–1.2)
Total Protein: 7.3 g/dL (ref 6.5–8.1)

## 2018-03-19 LAB — CBC WITH DIFFERENTIAL (CANCER CENTER ONLY)
Basophils Absolute: 0 10*3/uL (ref 0.0–0.1)
Basophils Relative: 0 %
Eosinophils Absolute: 0.1 10*3/uL (ref 0.0–0.5)
Eosinophils Relative: 1 %
HCT: 39.8 % (ref 34.8–46.6)
Hemoglobin: 12.6 g/dL (ref 11.6–15.9)
Lymphocytes Relative: 22 %
Lymphs Abs: 2.1 10*3/uL (ref 0.9–3.3)
MCH: 29.8 pg (ref 25.1–34.0)
MCHC: 31.7 g/dL (ref 31.5–36.0)
MCV: 94.1 fL (ref 79.5–101.0)
Monocytes Absolute: 0.9 10*3/uL (ref 0.1–0.9)
Monocytes Relative: 9 %
Neutro Abs: 6.3 10*3/uL (ref 1.5–6.5)
Neutrophils Relative %: 68 %
Platelet Count: 266 10*3/uL (ref 145–400)
RBC: 4.23 MIL/uL (ref 3.70–5.45)
RDW: 17.3 % — ABNORMAL HIGH (ref 11.2–14.5)
WBC Count: 9.4 10*3/uL (ref 3.9–10.3)

## 2018-03-19 LAB — FERRITIN: Ferritin: 30 ng/mL (ref 11–307)

## 2018-03-30 ENCOUNTER — Ambulatory Visit (INDEPENDENT_AMBULATORY_CARE_PROVIDER_SITE_OTHER): Payer: BC Managed Care – PPO | Admitting: Licensed Clinical Social Worker

## 2018-03-30 ENCOUNTER — Encounter (HOSPITAL_COMMUNITY): Payer: Self-pay | Admitting: Licensed Clinical Social Worker

## 2018-03-30 DIAGNOSIS — F3162 Bipolar disorder, current episode mixed, moderate: Secondary | ICD-10-CM | POA: Diagnosis not present

## 2018-03-30 NOTE — Progress Notes (Signed)
   THERAPIST PROGRESS NOTE  Session Time: 11:10-12pm  Participation Level: Active  Behavioral Response: CasualAlert/Depressed  Type of Therapy: Individual Therapy  Treatment Goals addressed: Improve psychiatric symptoms, Controlled Behavior, Moderated Mood, Improve Unhelpful Thought Patterns, Emotional Regulation Skills (Moderate moods, anger management, stress management), Feel and express a full Range of Emotions, Learn about Diagnosis, Healthy Coping Skills.   Interventions:  CBT  Summary: Christine [name] is a 42 y.o. female who presents for her individual counseling session. Pt discussed her psychiatric symptoms and current life events. Pt shared she is not pregnant as she thought at the last session. Asked open ended questions and assisted pt processing her feelings.  Pt presented depressed today. She was tearful during session. Today was first day of school for her children's new school year. She is focusing on being disabled. She has not heard anything from her disability hearing. Pt has fear of being a failure if she is disabled for life. Educated pt on the subject of fear as one of the most powerful emotions.  Further educated pt that anxiety is a part of fear usually to do with something going wrong in the future.  Educated pt on the purpose process and practice of mindfulness skills.      Suicidal/Homicidal: Nowithout intent/plan  Therapist Response: Assessed pt's current function by self report and reviewed progress. Assisted pt processing her psychiatric symptoms,  Disability hearing, being disabled, fear of failure, mindfulness skills.  Assisted pt processing for the management of her stressors.   Plan: Return again in 2 weeks.  Diagnosis: Axis I:  Bipolar 1 Disorder, mixed moderate    MACKENZIE,LISBETH S, LCAS 03/30/2018

## 2018-04-08 ENCOUNTER — Encounter (HOSPITAL_COMMUNITY): Payer: Self-pay | Admitting: Licensed Clinical Social Worker

## 2018-04-08 ENCOUNTER — Ambulatory Visit (INDEPENDENT_AMBULATORY_CARE_PROVIDER_SITE_OTHER): Payer: BC Managed Care – PPO | Admitting: Licensed Clinical Social Worker

## 2018-04-08 DIAGNOSIS — F3162 Bipolar disorder, current episode mixed, moderate: Secondary | ICD-10-CM | POA: Diagnosis not present

## 2018-04-08 NOTE — Progress Notes (Signed)
   THERAPIST PROGRESS NOTE  Session Time: 9:10-11am  Participation Level: Active  Behavioral Response: CasualAlert/Depressed  Type of Therapy: Individual Therapy  Treatment Goals addressed: Improve psychiatric symptoms, Controlled Behavior, Moderated Mood, Improve Unhelpful Thought Patterns, Emotional Regulation Skills (Moderate moods, anger management, stress management), Feel and express a full Range of Emotions, Learn about Diagnosis, Healthy Coping Skills.   Interventions:  CBT  Summary: Christine [name] is a 42 y.o. female who presents for her individual counseling session. Pt discussed her psychiatric symptoms and current life events. Pt was tearful upon presenting for her appointment as she just learned her mother hs stage 4 liver cancer. She and her mother have a tenuous relationship. Asked open ended questions and used empathic reflection. Pt is confused how to support her mother but not like to be around her for long periods of time. Discussed relationship circles and boundaries with pt. Pt discussed the topic of scheduling again.  Educated pt on the benefits of scheduling for mood stabilization.    Suicidal/Homicidal: Nowithout intent/plan  Therapist Response: Assessed pt's current function by self report and reviewed progress. Assisted pt processing benefits of scheduling, mother's cancer dx, relationship circles and boundaries.  Assisted pt processing for the management of her stressors.   Plan: Return again in 2 weeks.  Diagnosis: Axis I:  Bipolar 1 Disorder, mixed moderate    MACKENZIE,LISBETH S, LCAS 04/08/2018

## 2018-04-13 ENCOUNTER — Ambulatory Visit
Admission: RE | Admit: 2018-04-13 | Discharge: 2018-04-13 | Disposition: A | Discharge status: Home / Self Care | Payer: BC Managed Care – PPO | Source: Ambulatory Visit | Attending: Family Medicine | Admitting: Family Medicine

## 2018-04-13 ENCOUNTER — Ambulatory Visit (HOSPITAL_COMMUNITY): Payer: Self-pay | Admitting: Licensed Clinical Social Worker

## 2018-04-13 ENCOUNTER — Other Ambulatory Visit (HOSPITAL_COMMUNITY): Payer: Self-pay

## 2018-04-13 ENCOUNTER — Other Ambulatory Visit: Payer: Self-pay | Admitting: Family Medicine

## 2018-04-13 DIAGNOSIS — R062 Wheezing: Secondary | ICD-10-CM

## 2018-04-13 DIAGNOSIS — R52 Pain, unspecified: Secondary | ICD-10-CM

## 2018-04-13 DIAGNOSIS — F3162 Bipolar disorder, current episode mixed, moderate: Secondary | ICD-10-CM

## 2018-04-13 MED ORDER — BREXPIPRAZOLE 2 MG PO TABS: 2.0000 mg | ORAL_TABLET | Freq: Every day | ORAL | 0 refills | Status: DC

## 2018-04-13 MED ORDER — LAMOTRIGINE 150 MG PO TABS: 300.0000 mg | ORAL_TABLET | Freq: Every day | ORAL | 0 refills | Status: DC

## 2018-04-13 MED ORDER — LITHIUM CARBONATE ER 300 MG PO TBCR: EXTENDED_RELEASE_TABLET | ORAL | 0 refills | Status: DC

## 2018-04-16 ENCOUNTER — Telehealth: Payer: Self-pay | Admitting: Hematology

## 2018-04-16 NOTE — Telephone Encounter (Signed)
YF PAL 9/19. Moved appointments to 9/30. Left message for patient. Schedule mailed.

## 2018-04-23 ENCOUNTER — Other Ambulatory Visit: Payer: Self-pay

## 2018-04-23 ENCOUNTER — Ambulatory Visit: Payer: Self-pay | Admitting: Hematology

## 2018-04-27 ENCOUNTER — Encounter (HOSPITAL_COMMUNITY): Payer: Self-pay | Admitting: Licensed Clinical Social Worker

## 2018-04-27 ENCOUNTER — Ambulatory Visit (INDEPENDENT_AMBULATORY_CARE_PROVIDER_SITE_OTHER): Payer: BC Managed Care – PPO | Admitting: Licensed Clinical Social Worker

## 2018-04-27 DIAGNOSIS — F3162 Bipolar disorder, current episode mixed, moderate: Secondary | ICD-10-CM

## 2018-04-27 NOTE — Progress Notes (Addendum)
   THERAPIST PROGRESS NOTE  Session Time: 9:10-11am  Participation Level: Active  Behavioral Response: CasualAlert/Sad  Type of Therapy: Individual Therapy  Treatment Goals addressed: Improve psychiatric symptoms, Controlled Behavior, Moderated Mood, Improve Unhelpful Thought Patterns, Emotional Regulation Skills (Moderate moods, anger management, stress management), Feel and express a full Range of Emotions, Learn about Diagnosis, Healthy Coping Skills.   Interventions:  CBT  Summary: Christine [name] is a 42 y.o. female who presents for her individual counseling session. Pt discussed her psychiatric symptoms and current life events. Pt was tearful during her session today. She was previously told her mother has stage 4 liver cancer. She has since learned she has stage 4 pancreatic cancer. She and her family went to visit her mother this past weekend. She feels this is the last time she will se her mother alive. Asked open ended questions and used empathic reflection. Her mother is home, sleeps a lot & is in pain. Her father and brother are her mother's caretakers. Discussed the options of Hospice at length. Pt shares she is struggling to remember all of her coping tools. Reviewed some of her coping tools she has learned in PHP and IOP. Pt had questions about what to say to her daughters. Processed this with pt.     Suicidal/Homicidal: Nowithout intent/plan  Therapist Response: Assessed pt's current function by self report and reviewed progress. Assisted pt processing  mother's cancer dx, hospice, coping tools, talking to her children about death.  Assisted pt processing for the management of her stressors.   Plan: Return again in 2 weeks. Discuss disability ending 12/19  Diagnosis: Axis I:  Bipolar 1 Disorder, mixed moderate    Holdyn Poyser S, LCAS 04/27/2018

## 2018-04-28 ENCOUNTER — Telehealth (HOSPITAL_COMMUNITY): Payer: Self-pay

## 2018-04-28 ENCOUNTER — Ambulatory Visit (INDEPENDENT_AMBULATORY_CARE_PROVIDER_SITE_OTHER): Payer: BC Managed Care – PPO | Admitting: Psychiatry

## 2018-04-28 ENCOUNTER — Encounter (HOSPITAL_COMMUNITY): Payer: Self-pay | Admitting: Psychiatry

## 2018-04-28 VITALS — BP 162/104 | HR 98 | Ht 65.0 in | Wt 347.0 lb

## 2018-04-28 DIAGNOSIS — Z79899 Other long term (current) drug therapy: Secondary | ICD-10-CM | POA: Diagnosis not present

## 2018-04-28 NOTE — Progress Notes (Signed)
BH MD/PA/NP OP Progress Note  04/28/2018 3:29 PM Christine [name]  MRN:  [id number]  Chief Complaint: Patient returns for medication management/follow-up HPI: I am covering for [name] who is currently out of office.  She understands she will continue to follow-up with [name]. 42 year old married female.  She has been diagnosed with bipolar disorder. Returns for medication management appointment. States that generally she is been "okay" although recently has been facing increased stressors which have caused some increased anxiety.  She reports her mother has been diagnosed with advanced pancreatic cancer.  Mother lives 3 hours away so it is difficult for patient to visit her or her sister often.  Another concern is that patient worries she may lose some of her benefits within the next few months. She denies medication side effects. Denies suicidal ideations. Presents alert, attentive, affect reactive, describes increased anxiety recently. Visit Diagnosis:    ICD-10-CM   1. Encounter for long-term (current) use of medications U76.546 Basic Metabolic Panel (BMET)    Lithium level    TSH    TSH    Lithium level    Basic Metabolic Panel (BMET)    Past Psychiatric History:   Past Medical History:  Past Medical History:  Diagnosis Date  . Anemia   . Anxiety   . Arthritis    Hands, ankles  . Bipolar 1 disorder ([place])   . Depression   . Fibroids   . Headache   . High blood pressure   . History of blood transfusion 08/2017   [facility]  . History of hiatal hernia   . Mental disorder   . PCOS (polycystic ovarian syndrome)   . Seasonal allergies     Past Surgical History:  Procedure Laterality Date  . CERVICAL POLYPECTOMY  01/15/2018   Procedure: CERVICAL POLYPECTOMY;  Surgeon: Arvella Nigh, MD;  Location: [facility];  Service: Gynecology;;  . COLONOSCOPY  2008   Normal  . DIAGNOSTIC LAPAROSCOPY  08/1999   dermoid cyst, [facility]  . DILATION AND CURETTAGE OF UTERUS  05/2004   [facility]  .  DILATION AND CURETTAGE OF UTERUS  04/2015  . HYSTEROSCOPY W/D&C  07/22/2011   Procedure: DILATATION AND CURETTAGE /HYSTEROSCOPY;  Surgeon: Cyril Mourning, MD;  Location: [facility];  Service: Gynecology;;  . HYSTEROSCOPY W/D&C N/A 01/15/2018   Procedure: DILATATION AND CURETTAGE [place] WITH MYOSURE;  Surgeon: Arvella Nigh, MD;  Location: [facility];  Service: Gynecology;  Laterality: N/A;    Family Psychiatric History:   Family History:  Family History  Problem Relation Age of Onset  . Anxiety disorder Mother   . Depression Mother   . Cancer Father        colon  . Cancer Brother 6       leukemia, childhood    Social History:  Social History   Socioeconomic History  . Marital status: Married    Spouse name: Not on file  . Number of children: Not on file  . Years of education: Not on file  . Highest education level: Not on file  Occupational History  . Not on file  Social Needs  . Financial resource strain: Somewhat hard  . Food insecurity:    Worry: Sometimes true    Inability: Sometimes true  . Transportation needs:    Medical: No    Non-medical: No  Tobacco Use  . Smoking status: Never Smoker  . Smokeless tobacco: Never Used  Substance and Sexual Activity  . Alcohol use: Yes  Comment: occasional  . Drug use: No  . Sexual activity: Yes    Partners: Male    Birth control/protection: None  Lifestyle  . Physical activity:    Days per week: 0 days    Minutes per session: Not on file  . Stress: Rather much  Relationships  . Social connections:    Talks on phone: Never    Gets together: Never    Attends religious service: Never    Active member of club or organization: No    Attends meetings of clubs or organizations: Never    Relationship status: Not on file  Other Topics Concern  . Not on file  Social History Narrative  . Not on file    Allergies:  Allergies  Allergen Reactions  . Penicillins Rash and Other (See Comments)    Has patient had a PCN  reaction causing immediate rash, facial/tongue/throat swelling, SOB or lightheadedness with hypotension: Yes Has patient had a PCN reaction causing severe rash involving mucus membranes or skin necrosis: Unknown Has patient had a PCN reaction that required hospitalization: No Has patient had a PCN reaction occurring within the last 10 years: No If all of the above answers are "NO", then may proceed with Cephalosporin use.     Metabolic Disorder Labs: No results found for: HGBA1C, MPG No results found for: PROLACTIN No results found for: CHOL, TRIG, HDL, CHOLHDL, VLDL, LDLCALC No results found for: TSH  Therapeutic Level Labs: Lab Results  Component Value Date   LITHIUM 0.7 10/17/2017   LITHIUM 0.6 10/10/2017   No results found for: VALPROATE No components found for:  CBMZ  Current Medications: Current Outpatient Medications  Medication Sig Dispense Refill  . amLODipine (NORVASC) 10 MG tablet Take 1 tablet (10 mg total) by mouth daily. 30 tablet 0  . Brexpiprazole ([name]) 2 MG TABS Take 2 mg by mouth daily. 30 tablet 0  . clonazePAM (KLONOPIN) 0.5 MG tablet Take 1 tablet (0.5 mg total) by mouth 2 (two) times daily as needed for anxiety. 60 tablet 1  . fluticasone (FLONASE) 50 MCG/ACT nasal spray Place 2 sprays into both nostrils daily as needed for allergies.     [name] lamoTRIgine (LAMICTAL) 150 MG tablet Take 2 tablets (300 mg total) by mouth daily. 60 tablet 0  . lithium carbonate (LITHOBID) 300 MG CR tablet Take 600 mg in am and 600 mg at pm and 300 mg at bed time 150 tablet 0  . meloxicam (MOBIC) 7.5 MG tablet Take 7.5 mg by mouth 2 (two) times daily as needed for pain.   2   No current facility-administered medications for this visit.      Musculoskeletal: Strength & Muscle Tone: within normal limits Gait & Station: normal Patient leans: N/A  Psychiatric Specialty Exam: ROS no headache, no chest pain, no shortness of breath  Blood pressure (!) 162/104, pulse 98, height  5\' 5"  (1.651 m), weight (!) 157.4 kg.Body mass index is 57.74 kg/m.  General Appearance: Well Groomed  Eye Contact:  Good  Speech:  Normal Rate  Volume:  Normal  Mood:  Reports some lingering depression and anxiety  Affect:  Reactive, vaguely anxious, improves during session  Thought Process:  Linear and Descriptions of Associations: Intact  Orientation:  Full (Time, Place, and Person)  Thought Content: No hallucinations, no delusions   Suicidal Thoughts:  No denies suicidal or self-injurious ideations  Homicidal Thoughts:  No  Memory:  Recent and remote grossly intact  Judgement:  Fair  Insight:  Fair  Psychomotor Activity:  Normal  Concentration:  Concentration: Good and Attention Span: Good  Recall:  Good  Fund of Knowledge: Good  Language: Good  Akathisia:  Negative  Handed:  Right  AIMS (if indicated): No abnormal movements noted or reported  Assets:  Desire for Improvement Resilience  ADL's:  Intact  Cognition: WNL  Sleep:  Fair   Screenings: GAD-7     Counselor from 09/09/2017 in [facility] from 04/04/2017 in [employer] [facility] from 03/18/2017 in [facility]  Total GAD-7 Score  16  7  18     PHQ2-9     Counselor from 09/26/2017 in [facility] from 09/19/2017 in [facility] from 09/09/2017 in [facility] [facility] from 04/04/2017 in [facility] from 03/28/2017 in [facility]  PHQ-2 Total Score  3  4  6  2  4   PHQ-9 Total Score  12  16  21  10  15        Assessment and Plan: 42 year old female, long history of mood disorder, has been diagnosed with bipolar disorder and with anxiety/generalized anxiety disorder.  Returns for medication  management. Reports some increased anxiety related to family stressors.  Mother currently chronically ill with advanced malignancy.  Reports functioning well and daily activities, denies suicidal ideations or major neuro vegetative symptoms. Currently on lamotrigine 300 mg daily, lithium which she is taking at 900 mg in the morning and 600 mg at night, [name] 2 mg daily, Klonopin 0.5 mg twice daily PRN for anxiety. States she currently does not need any prescriptions as she has enough medication at home.   Will order routine labs- BMP, Li serum level, TSH. Of note patient's blood pressure is elevated (does not describe associated symptoms)-she is prescribed Norvasc by her PCP.  Encouraged to continue frequent BP monitoring and follow with PCP for further treatment/med adjustments as needed.  Continue individual psychotherapy. Patient to return to 2 months for med management, agrees to contact clinic sooner should to be any concern prior. No medications prescribed by me today.    Jenne Campus, MD 04/28/2018, 3:29 PM

## 2018-04-28 NOTE — Telephone Encounter (Signed)
Medication management - Telephone call with patient, per request of Dr. Parke Poisson, to follow-up on her raised BP earlier today.  Pt stated she had had it checked recently twice with her PCP and was fine but would monitor and also call to let them know what it was running today when she came in for evaluation.  Patient stated she has been under a little more stress lately and had not taken her Clonazepam yet today to help with anxiety but will let her PCP know of the 162/104 reading from today. Patient to call if any more information needed from [place] and stated understanding concern and will follow up.

## 2018-04-29 LAB — TSH: TSH: 1.46 u[IU]/mL (ref 0.450–4.500)

## 2018-04-29 LAB — BASIC METABOLIC PANEL
BUN/Creatinine Ratio: 9 (ref 9–23)
BUN: 6 mg/dL (ref 6–24)
CO2: 21 mmol/L (ref 20–29)
Calcium: 9.3 mg/dL (ref 8.7–10.2)
Chloride: 103 mmol/L (ref 96–106)
Creatinine, Ser: 0.7 mg/dL (ref 0.57–1.00)
GFR calc Af Amer: 125 mL/min/{1.73_m2} (ref >59)
GFR calc non Af Amer: 109 mL/min/{1.73_m2} (ref >59)
Glucose: 74 mg/dL (ref 65–99)
Potassium: 4.4 mmol/L (ref 3.5–5.2)
Sodium: 139 mmol/L (ref 134–144)

## 2018-04-29 LAB — LITHIUM LEVEL: Lithium Lvl: <0.1 mmol/L — ABNORMAL LOW (ref 0.6–1.2)

## 2018-05-04 ENCOUNTER — Telehealth: Payer: Self-pay | Admitting: Hematology

## 2018-05-04 ENCOUNTER — Inpatient Hospital Stay: Payer: BC Managed Care – PPO

## 2018-05-04 ENCOUNTER — Inpatient Hospital Stay: Payer: BC Managed Care – PPO | Admitting: Hematology

## 2018-05-04 NOTE — Telephone Encounter (Signed)
Patient called to reschedule  °

## 2018-05-15 ENCOUNTER — Inpatient Hospital Stay: Payer: BC Managed Care – PPO | Admitting: Hematology

## 2018-05-15 ENCOUNTER — Inpatient Hospital Stay: Payer: BC Managed Care – PPO | Attending: Hematology and Oncology

## 2018-05-18 ENCOUNTER — Ambulatory Visit (HOSPITAL_COMMUNITY): Payer: Self-pay | Admitting: Licensed Clinical Social Worker

## 2018-05-21 ENCOUNTER — Encounter (HOSPITAL_COMMUNITY): Payer: Self-pay | Admitting: Licensed Clinical Social Worker

## 2018-05-21 ENCOUNTER — Ambulatory Visit (INDEPENDENT_AMBULATORY_CARE_PROVIDER_SITE_OTHER): Payer: BC Managed Care – PPO | Admitting: Licensed Clinical Social Worker

## 2018-05-21 DIAGNOSIS — F3162 Bipolar disorder, current episode mixed, moderate: Secondary | ICD-10-CM

## 2018-05-21 NOTE — Progress Notes (Signed)
   THERAPIST PROGRESS NOTE  Session Time: 1:30-2:20pm  Participation Level: Active  Behavioral Response: CasualAlert/Sad  Type of Therapy: Individual Therapy  Treatment Goals addressed: Improve psychiatric symptoms, Controlled Behavior, Moderated Mood, Improve Unhelpful Thought Patterns, Emotional Regulation Skills (Moderate moods, anger management, stress management), Feel and express a full Range of Emotions, Learn about Diagnosis, Healthy Coping Skills.   Interventions:  CBT/[place] plan review  Summary: Christine [name] is a 42 y.o. female who presents for her individual counseling session. Pt discussed her psychiatric symptoms and current life events. Pt was tearful during her session today. Her mother passed away last week and the funeral was Sunday. Pt talked about her feelings, her relationship with her mother, caring for her children's emotions, the funeral. Before her mothers passing they found a kitten and she died yesterday in her daughter's bed. Pt talked, cried, listened to suggestions. Suggested pt to call  Hospice for counseling and for her 2 daughters. Gave pt a grief counseling workbook. Educated pt on the stages of grief.      Suicidal/Homicidal: Nowithout intent/plan  Therapist Response: Assessed pt's current function by self report and reviewed progress. Assisted pt processing  mother's passing, hospice, coping tools, assisting pt in talking to her children about death.  Assisted pt processing for the management of her stressors.   Plan: Return again in 2 weeks. Discuss disability ending 12/19  Diagnosis: Axis I:  Bipolar 1 Disorder, mixed moderate    Lilymae Swiech S, LCAS 05/21/2018

## 2018-06-01 ENCOUNTER — Encounter (HOSPITAL_COMMUNITY): Payer: Self-pay | Admitting: Licensed Clinical Social Worker

## 2018-06-01 ENCOUNTER — Ambulatory Visit (INDEPENDENT_AMBULATORY_CARE_PROVIDER_SITE_OTHER): Payer: BC Managed Care – PPO | Admitting: Licensed Clinical Social Worker

## 2018-06-01 DIAGNOSIS — F3162 Bipolar disorder, current episode mixed, moderate: Secondary | ICD-10-CM

## 2018-06-01 NOTE — Progress Notes (Signed)
   THERAPIST PROGRESS NOTE  Session Time: 10:10-11am  Participation Level: Active  Behavioral Response: CasualAlert/Depressed  Type of Therapy: Individual Therapy  Treatment Goals addressed: Improve psychiatric symptoms, Controlled Behavior, Moderated Mood, Improve Unhelpful Thought Patterns, Emotional Regulation Skills (Moderate moods, anger management, stress management), Feel and express a full Range of Emotions, Learn about Diagnosis, Healthy Coping Skills.   Interventions:  CBT/  Summary: Christine [name] is a 42 y.o. female who presents for her individual counseling session. Pt discussed her psychiatric symptoms and current life events. Pt presented depressed today. "Ive had a couple of bad days last week." I was only able to get up and get my kids ready for school." "Im so overwhelmed." Asked open ended questions. Pt shared: disability hearing, no$, moving back in with her father in [place]., brother's disbility, care for her father, grieving mother's death.  Discussed the importance of mindfulness techniques. Educated pt on fear  manifesting into anxiety, fear being a future feeling.        Suicidal/Homicidal: Nowithout intent/plan  Therapist Response: Assessed pt's current function by self report and reviewed progress. Assisted pt processing  mother's passing, hospice, coping tools, assisting pt in talking to her children about death.  Assisted pt processing for the management of her stressors.   Plan: Return again in 2 weeks. Kitten, hospice, grief process, grief workbook, stressors above  Diagnosis: Axis I:  Bipolar 1 Disorder, mixed moderate    MACKENZIE,LISBETH S, LCAS 06/01/2018

## 2018-06-12 ENCOUNTER — Other Ambulatory Visit (HOSPITAL_COMMUNITY): Payer: Self-pay

## 2018-06-12 DIAGNOSIS — F3162 Bipolar disorder, current episode mixed, moderate: Secondary | ICD-10-CM

## 2018-06-12 MED ORDER — LITHIUM CARBONATE ER 300 MG PO TBCR: EXTENDED_RELEASE_TABLET | ORAL | 0 refills | Status: DC

## 2018-06-12 MED ORDER — LAMOTRIGINE 150 MG PO TABS: 300.0000 mg | ORAL_TABLET | Freq: Every day | ORAL | 0 refills | Status: DC

## 2018-06-12 MED ORDER — BREXPIPRAZOLE 2 MG PO TABS: 2.0000 mg | ORAL_TABLET | Freq: Every day | ORAL | 0 refills | Status: DC

## 2018-06-15 ENCOUNTER — Ambulatory Visit (INDEPENDENT_AMBULATORY_CARE_PROVIDER_SITE_OTHER): Payer: BC Managed Care – PPO | Admitting: Licensed Clinical Social Worker

## 2018-06-15 ENCOUNTER — Encounter (HOSPITAL_COMMUNITY): Payer: Self-pay | Admitting: Licensed Clinical Social Worker

## 2018-06-15 DIAGNOSIS — F3162 Bipolar disorder, current episode mixed, moderate: Secondary | ICD-10-CM | POA: Diagnosis not present

## 2018-06-15 NOTE — Progress Notes (Signed)
   THERAPIST PROGRESS NOTE  Session Time: 11:10-12pm  Participation Level: Active  Behavioral Response: CasualAlert/Depressed/Anxious  Type of Therapy: Individual Therapy  Treatment Goals addressed: Improve psychiatric symptoms, Controlled Behavior, Moderated Mood, Improve Unhelpful Thought Patterns, Emotional Regulation Skills (Moderate moods, anger management, stress management), Feel and express a full Range of Emotions, Learn about Diagnosis, Healthy Coping Skills.   Interventions:  CBT/  Summary: Christine [name] is a 42 y.o. female who presents for her individual counseling session. Pt discussed her psychiatric symptoms and current life events. Pt presented depressed and anxious today. She was tearful in her discussion of missing her mom. "I haven't been able to grieve yet because so many things are going on in my life." She and her brother and father are having talks about $. Pt is disappointed and feels they are assuming that she is not good with $. Pt's state disability runs out this month and that is her only income. She still hs not heard from her SS Disability hearing. She and her family have contemplated moving back home to [facility] for financial reasons. Her father reported he will help them pay bills and her husband is going to work at [employer] eats and [employer]. Pt was frustrated, sad, hurt, angry during session. Assisted pt processing her feelings. Suggested they drive to [facility] [place] to have a face to face discussion with her father. Pt was in agreement to suggestions. Assisted pt in a mindful activity to be present in the moment.   Suicidal/Homicidal: Nowithout intent/plan  Therapist Response: Assessed pt's current function by self report and reviewed progress. Assisted pt processing  Grief, feelings, disability, moving home.  Assisted pt processing for the management of her stressors.   Plan: Return again in 2 weeks. Kitten, hospice, grief process, grief workbook, stressors    Diagnosis: Axis I:  Bipolar 1 Disorder, mixed moderate    MACKENZIE,LISBETH S, LCAS 06/15/2018

## 2018-06-22 ENCOUNTER — Ambulatory Visit (INDEPENDENT_AMBULATORY_CARE_PROVIDER_SITE_OTHER): Payer: BC Managed Care – PPO | Admitting: Psychiatry

## 2018-06-22 ENCOUNTER — Encounter (HOSPITAL_COMMUNITY): Payer: Self-pay | Admitting: Psychiatry

## 2018-06-22 DIAGNOSIS — F411 Generalized anxiety disorder: Secondary | ICD-10-CM

## 2018-06-22 DIAGNOSIS — F3162 Bipolar disorder, current episode mixed, moderate: Secondary | ICD-10-CM

## 2018-06-22 MED ORDER — BREXPIPRAZOLE 2 MG PO TABS: 2.0000 mg | ORAL_TABLET | Freq: Every day | ORAL | 0 refills | Status: DC

## 2018-06-22 MED ORDER — LITHIUM CARBONATE ER 450 MG PO TBCR: EXTENDED_RELEASE_TABLET | ORAL | 0 refills | Status: DC

## 2018-06-22 MED ORDER — LAMOTRIGINE 150 MG PO TABS: 300.0000 mg | ORAL_TABLET | Freq: Every day | ORAL | 0 refills | Status: DC

## 2018-06-22 MED ORDER — CLONAZEPAM 0.5 MG PO TABS: 0.5000 mg | ORAL_TABLET | Freq: Two times a day (BID) | ORAL | 0 refills | Status: DC | PRN

## 2018-06-22 NOTE — Progress Notes (Signed)
BH MD/PA/NP OP Progress Note  06/22/2018 10:05 AM Christine [name]  MRN:  [id number]  Chief Complaint: I am under a lot of stress.  I am worried about my insurance.  HPI: Christine [name] came for her follow-up appointment.  She is under a lot of stress because she may lose her insurance if she do not get her benefits as soon as possible.  She had a hearing in July but is still waiting for the outcome.  She is even thinking to get food stamps because she has a lot of financial issues.  She reported some time having crying spells and feeling hopeless and helpless but denies any paranoia, hallucination or any suicidal thoughts.  She was last seen by Dr. Parke Poisson in my absence who did blood work.  However her lithium level is less than 1.  Patient told that she has been taking lithium but she admitted some time missed the dose.  She also concerned because her mother was diagnosed with advanced pancreatic cancer.  And her mother lives 3 hours away so is difficult to visit her.  She denies any tremors, shakes or any EPS.  She is seeing Charolotte Eke for therapy.  She reported that she is taking her medication including lithium.  She has no rash, itching or tremors from the Lamictal or [name].  She takes Klonopin 0.5 mg twice a day which helps her anxiety.  Her blood work shows normal basic metabolic panel and TSH.  However her lithium level is 0.1.  She is taking 1500 mg lithium a day.  She lives with her husband and 2 adopted children.  Her husband is very supportive.  Her energy level is okay.  Patient denies drinking alcohol or using any illegal substances.  She denies any mania or any psychosis.  Visit Diagnosis:    ICD-10-CM   1. Bipolar disorder, current episode mixed, moderate (HCC) F31.62 lithium carbonate (ESKALITH) 450 MG CR tablet    Brexpiprazole ([name]) 2 MG TABS    lamoTRIgine (LAMICTAL) 150 MG tablet  2. Generalized anxiety disorder F41.1 clonazePAM (KLONOPIN) 0.5 MG tablet    Past Psychiatric  History: Reviewed. Patienthad a history of depression since age 14. She took antidepressant that made her manic. She saw Dr. [name] and tried tried numerous medication which includes Risperdal, Seroquel, Abilify, lithium, Prozac, Paxil, Zoloft, Lexapro, Celexa, Effexor and Klonopin.She had history of mania and impulsive behavior. We have tried [name]. She has 3 psychiatric hospitalization.[facility] intensive outpatient program.Denies any history of suicidal attempt. Her last hospitalization was in November 2017.  Past Medical History:  Past Medical History:  Diagnosis Date  . Anemia   . Anxiety   . Arthritis    Hands, ankles  . Bipolar 1 disorder ([place])   . Depression   . Fibroids   . Headache   . High blood pressure   . History of blood transfusion 08/2017   [facility]  . History of hiatal hernia   . Mental disorder   . PCOS (polycystic ovarian syndrome)   . Seasonal allergies     Past Surgical History:  Procedure Laterality Date  . CERVICAL POLYPECTOMY  01/15/2018   Procedure: CERVICAL POLYPECTOMY;  Surgeon: Arvella Nigh, MD;  Location: [facility];  Service: Gynecology;;  . COLONOSCOPY  2008   Normal  . DIAGNOSTIC LAPAROSCOPY  08/1999   dermoid cyst, [facility]  . DILATION AND CURETTAGE OF UTERUS  05/2004   [facility]  . DILATION AND CURETTAGE OF UTERUS  04/2015  .  HYSTEROSCOPY W/D&C  07/22/2011   Procedure: DILATATION AND CURETTAGE /HYSTEROSCOPY;  Surgeon: Cyril Mourning, MD;  Location: [facility];  Service: Gynecology;;  . HYSTEROSCOPY W/D&C N/A 01/15/2018   Procedure: DILATATION AND CURETTAGE [place] WITH MYOSURE;  Surgeon: Arvella Nigh, MD;  Location: [facility];  Service: Gynecology;  Laterality: N/A;    Family Psychiatric History: Reviewed.  Family History:  Family History  Problem Relation Age of Onset  . Anxiety disorder Mother   . Depression Mother   . Cancer Father        colon  . Cancer Brother 6       leukemia, childhood    Social History:  Social  History   Socioeconomic History  . Marital status: Married    Spouse name: Not on file  . Number of children: Not on file  . Years of education: Not on file  . Highest education level: Not on file  Occupational History  . Not on file  Social Needs  . Financial resource strain: Somewhat hard  . Food insecurity:    Worry: Sometimes true    Inability: Sometimes true  . Transportation needs:    Medical: No    Non-medical: No  Tobacco Use  . Smoking status: Never Smoker  . Smokeless tobacco: Never Used  Substance and Sexual Activity  . Alcohol use: Yes    Comment: occasional  . Drug use: No  . Sexual activity: Yes    Partners: Male    Birth control/protection: None  Lifestyle  . Physical activity:    Days per week: 0 days    Minutes per session: Not on file  . Stress: Rather much  Relationships  . Social connections:    Talks on phone: Never    Gets together: Never    Attends religious service: Never    Active member of club or organization: No    Attends meetings of clubs or organizations: Never    Relationship status: Not on file  Other Topics Concern  . Not on file  Social History Narrative  . Not on file    Allergies:  Allergies  Allergen Reactions  . Penicillins Rash and Other (See Comments)    Has patient had a PCN reaction causing immediate rash, facial/tongue/throat swelling, SOB or lightheadedness with hypotension: Yes Has patient had a PCN reaction causing severe rash involving mucus membranes or skin necrosis: Unknown Has patient had a PCN reaction that required hospitalization: No Has patient had a PCN reaction occurring within the last 10 years: No If all of the above answers are "NO", then may proceed with Cephalosporin use.     Metabolic Disorder Labs: No results found for: HGBA1C, MPG No results found for: PROLACTIN No results found for: CHOL, TRIG, HDL, CHOLHDL, VLDL, LDLCALC Lab Results  Component Value Date   TSH 1.460 04/28/2018     Therapeutic Level Labs: Lab Results  Component Value Date   LITHIUM <0.1 (L) 04/28/2018   LITHIUM 0.7 10/17/2017   No results found for: VALPROATE No components found for:  CBMZ  Current Medications: Current Outpatient Medications  Medication Sig Dispense Refill  . amLODipine (NORVASC) 10 MG tablet Take 1 tablet (10 mg total) by mouth daily. 30 tablet 0  . Brexpiprazole ([name]) 2 MG TABS Take 2 mg by mouth daily. 30 tablet 0  . clonazePAM (KLONOPIN) 0.5 MG tablet Take 1 tablet (0.5 mg total) by mouth 2 (two) times daily as needed for anxiety. 60 tablet 1  . fluticasone (FLONASE)  50 MCG/ACT nasal spray Place 2 sprays into both nostrils daily as needed for allergies.     [name] lamoTRIgine (LAMICTAL) 150 MG tablet Take 2 tablets (300 mg total) by mouth daily. 60 tablet 0  . lithium carbonate (LITHOBID) 300 MG CR tablet Take 600 mg in am and 600 mg at pm and 300 mg at bed time 150 tablet 0  . meloxicam (MOBIC) 7.5 MG tablet Take 7.5 mg by mouth 2 (two) times daily as needed for pain.   2   No current facility-administered medications for this visit.      Musculoskeletal: Strength & Muscle Tone: within normal limits Gait & Station: normal Patient leans: N/A  Psychiatric Specialty Exam: ROS  Blood pressure 116/82, pulse 92, height 5\' 5"  (1.651 m), weight (!) 341 lb (154.7 kg), SpO2 94 %.There is no height or weight on file to calculate BMI.  General Appearance: Casual  Eye Contact:  Fair  Speech:  Clear and Coherent  Volume:  Normal  Mood:  Dysphoric  Affect:  Congruent  Thought Process:  Goal Directed  Orientation:  Full (Time, Place, and Person)  Thought Content: Rumination   Suicidal Thoughts:  No  Homicidal Thoughts:  No  Memory:  Immediate;   Good Recent;   Good Remote;   Good  Judgement:  Good  Insight:  Present  Psychomotor Activity:  Normal  Concentration:  Concentration: Fair and Attention Span: Fair  Recall:  Good  Fund of Knowledge: Good  Language: Good   Akathisia:  No  Handed:  Right  AIMS (if indicated): not done  Assets:  Communication Skills Desire for Improvement Housing Resilience Social Support  ADL's:  Intact  Cognition: WNL  Sleep:  Fair   Screenings: GAD-7     Counselor from 09/09/2017 in [facility] from 04/04/2017 in [facility] from 03/18/2017 in [facility]  Total GAD-7 Score  16  7  18     PHQ2-9     Counselor from 09/26/2017 in [facility] from 09/19/2017 in [facility] from 09/09/2017 in [facility] from 04/04/2017 in [facility] from 03/28/2017 in [facility]  PHQ-2 Total Score  3  4  6  2  4   PHQ-9 Total Score  12  16  21  10  15        Assessment and Plan: Bipolar disorder type I.  Generalized anxiety disorder.  I reviewed her blood work results.  Her lithium level is 0.1 but patient reported she is taking the medication as prescribed other than some time she skipped the dose.  I recommended to try going up on lithium and we will start Lithobid 900 mg twice a day.  We will repeat the blood work in few weeks.  Continue Lamictal 300 mg daily as patient has no rash or itching.  Continue [name] 2 mg daily and Klonopin 0.5 mg twice a day.  I will also do gene testing as patient had tried multiple medication in the past with limited outcome.  Her major concern is losing her benefit and she is now trying to get food stamps.  She had applied her benefits with the help of lawyer and she is hoping that she may hear her decision very soon.  Reassurance given.  Encouraged to continue therapy with Charolotte Eke.  Recommended to call us back  if she has any question or any concern.  I  will see her again in 4 weeks.  Encourage healthy lifestyle and watch her calorie intake and do regular walk to help her anxiety.   Kathlee Nations, MD 06/22/2018, 10:05 AM

## 2018-06-24 ENCOUNTER — Ambulatory Visit (INDEPENDENT_AMBULATORY_CARE_PROVIDER_SITE_OTHER): Payer: BC Managed Care – PPO | Admitting: Licensed Clinical Social Worker

## 2018-06-24 ENCOUNTER — Encounter (HOSPITAL_COMMUNITY): Payer: Self-pay | Admitting: Licensed Clinical Social Worker

## 2018-06-24 DIAGNOSIS — F3162 Bipolar disorder, current episode mixed, moderate: Secondary | ICD-10-CM

## 2018-06-24 NOTE — Progress Notes (Signed)
   THERAPIST PROGRESS NOTE  Session Time: 3:30-4:20pm  Participation Level: Active  Behavioral Response: CasualAlert/Depressed/Anxious  Type of Therapy: Individual Therapy  Treatment Goals addressed: Improve psychiatric symptoms, Controlled Behavior, Moderated Mood, Improve Unhelpful Thought Patterns, Emotional Regulation Skills (Moderate moods, anger management, stress management), Feel and express a full Range of Emotions, Learn about Diagnosis, Healthy Coping Skills.   Interventions:  CBT/  Summary: Christine [name] is a 42 y.o. female who presents for her individual counseling session. Pt discussed her psychiatric symptoms and current life events. Pt presented tearful, depressed and anxious today. Pt is concerned about her insurance, ability to pay rent and other bills. Pt's LTD runs out this month. She has applied for food stamps. She reports she is angry. Asked open ended questions about her anger. Tearfully, "I wish I could work!" Im angry at a lot of things... Discussed how she is exhibiting her anger: crying, more snappy and irritable. Pt has applied for medicaid and food stamps. Pt had a discussion with her father about $ and it did not go well. Processed the discussion with pt. Discussed fear with pt as she is exhibiting signs of fear. Gave pt 015% discount application. Will see pt Monday.  Suicidal/Homicidal: Nowithout intent/plan  Therapist Response: Assessed pt's current function by self report and reviewed progress. Assisted pt processing  Grief, feelings, disability, moving home.  Assisted pt processing for the management of her stressors.   Plan: Return again in 1 weeks.   Diagnosis: Axis I:  Bipolar 1 Disorder, mixed moderate    Neely Kammerer S, LCAS 06/24/2018

## 2018-06-29 ENCOUNTER — Encounter (HOSPITAL_COMMUNITY): Payer: Self-pay | Admitting: Licensed Clinical Social Worker

## 2018-06-29 ENCOUNTER — Ambulatory Visit (INDEPENDENT_AMBULATORY_CARE_PROVIDER_SITE_OTHER): Payer: BC Managed Care – PPO | Admitting: Licensed Clinical Social Worker

## 2018-06-29 DIAGNOSIS — F3162 Bipolar disorder, current episode mixed, moderate: Secondary | ICD-10-CM

## 2018-06-29 NOTE — Progress Notes (Signed)
   THERAPIST PROGRESS NOTE  Session Time: 11:10-12pm  Participation Level: Active  Behavioral Response: CasualAlert/Depressed/Anxious  Type of Therapy: Individual Therapy  Treatment Goals addressed: Improve psychiatric symptoms, Controlled Behavior, Moderated Mood, Improve Unhelpful Thought Patterns, Emotional Regulation Skills (Moderate moods, anger management, stress management), Feel and express a full Range of Emotions, Learn about Diagnosis, Healthy Coping Skills.   Interventions:  CBT  Summary: Christine [name] is a 41 y.o. female who presents for her final individual counseling session. Pt discussed her psychiatric symptoms and current life events. Pt presented tearful, depressed and anxious today. Today is her last day of therapy due to losing her health insurance. Processed this with pt with options. Referred pt to [facility] for counseling if she does not get medicaid or the [facility] discount. Pt did receive information she was approved for disability but it will be 4 months before she receives the award letter. She is concerned what she and her family will do for $ for the next 4 months. Discussed options with pt. Role played discussions with her family. Discussed the importance of compartmentalization and problem solving. Educated pt on both. Discussed the stages of grief with pt.    Suicidal/Homicidal: Nowithout intent/plan  Therapist Response: Assessed pt's current function by self report and reviewed progress. Assisted pt processing  Grief, feelings, disability, losing insurance, referral to another counselor, compartmentalizing and problem solving. Assisted pt processing for the management of her stressors.   Plan: Return when pt acquires insurance or get [employer] discount.  Diagnosis: Axis I:  Bipolar 1 Disorder, mixed moderate    Jonothan Heberle S, LCAS 06/29/2018

## 2018-07-13 ENCOUNTER — Ambulatory Visit (HOSPITAL_COMMUNITY): Payer: BC Managed Care – PPO | Admitting: Licensed Clinical Social Worker

## 2018-07-22 DIAGNOSIS — K429 Umbilical hernia without obstruction or gangrene: Secondary | ICD-10-CM | POA: Diagnosis not present

## 2018-07-22 DIAGNOSIS — R399 Unspecified symptoms and signs involving the genitourinary system: Secondary | ICD-10-CM | POA: Diagnosis not present

## 2018-07-27 ENCOUNTER — Ambulatory Visit (HOSPITAL_COMMUNITY): Payer: BC Managed Care – PPO | Admitting: Licensed Clinical Social Worker

## 2018-08-08 ENCOUNTER — Other Ambulatory Visit: Payer: Self-pay

## 2018-08-08 ENCOUNTER — Emergency Department (HOSPITAL_COMMUNITY): Payer: Medicare Other

## 2018-08-08 ENCOUNTER — Emergency Department (HOSPITAL_COMMUNITY)
Admission: EM | Admit: 2018-08-08 | Discharge: 2018-08-08 | Disposition: A | Discharge status: Home / Self Care | Payer: Medicare Other | Attending: Emergency Medicine | Admitting: Emergency Medicine

## 2018-08-08 ENCOUNTER — Encounter (HOSPITAL_COMMUNITY): Payer: Self-pay | Admitting: Emergency Medicine

## 2018-08-08 DIAGNOSIS — Z79899 Other long term (current) drug therapy: Secondary | ICD-10-CM | POA: Insufficient documentation

## 2018-08-08 DIAGNOSIS — K429 Umbilical hernia without obstruction or gangrene: Secondary | ICD-10-CM | POA: Diagnosis not present

## 2018-08-08 DIAGNOSIS — D259 Leiomyoma of uterus, unspecified: Secondary | ICD-10-CM | POA: Insufficient documentation

## 2018-08-08 DIAGNOSIS — I10 Essential (primary) hypertension: Secondary | ICD-10-CM | POA: Insufficient documentation

## 2018-08-08 DIAGNOSIS — R52 Pain, unspecified: Secondary | ICD-10-CM | POA: Diagnosis not present

## 2018-08-08 DIAGNOSIS — R1084 Generalized abdominal pain: Secondary | ICD-10-CM | POA: Diagnosis not present

## 2018-08-08 DIAGNOSIS — I517 Cardiomegaly: Secondary | ICD-10-CM | POA: Diagnosis not present

## 2018-08-08 DIAGNOSIS — R1033 Periumbilical pain: Secondary | ICD-10-CM | POA: Diagnosis present

## 2018-08-08 LAB — COMPREHENSIVE METABOLIC PANEL
ALT: 17 U/L (ref 0–44)
AST: 11 U/L — ABNORMAL LOW (ref 15–41)
Albumin: 3.8 g/dL (ref 3.5–5.0)
Alkaline Phosphatase: 58 U/L (ref 38–126)
Anion gap: 8 (ref 5–15)
BUN: 8 mg/dL (ref 6–20)
CO2: 21 mmol/L — ABNORMAL LOW (ref 22–32)
Calcium: 9 mg/dL (ref 8.9–10.3)
Chloride: 109 mmol/L (ref 98–111)
Creatinine, Ser: 0.83 mg/dL (ref 0.44–1.00)
GFR calc Af Amer: >60 mL/min (ref >60)
GFR calc non Af Amer: >60 mL/min (ref >60)
Glucose, Bld: 85 mg/dL (ref 70–99)
Potassium: 3.8 mmol/L (ref 3.5–5.1)
Sodium: 138 mmol/L (ref 135–145)
Total Bilirubin: 0.4 mg/dL (ref 0.3–1.2)
Total Protein: 7 g/dL (ref 6.5–8.1)

## 2018-08-08 LAB — CBC WITH DIFFERENTIAL/PLATELET
Abs Immature Granulocytes: 0.23 10*3/uL — ABNORMAL HIGH (ref 0.00–0.07)
Basophils Absolute: 0 10*3/uL (ref 0.0–0.1)
Basophils Relative: 0 %
Eosinophils Absolute: 0.1 10*3/uL (ref 0.0–0.5)
Eosinophils Relative: 1 %
HCT: 32.3 % — ABNORMAL LOW (ref 36.0–46.0)
Hemoglobin: 9.2 g/dL — ABNORMAL LOW (ref 12.0–15.0)
Immature Granulocytes: 2 %
Lymphocytes Relative: 16 %
Lymphs Abs: 1.9 10*3/uL (ref 0.7–4.0)
MCH: 24.4 pg — ABNORMAL LOW (ref 26.0–34.0)
MCHC: 28.5 g/dL — ABNORMAL LOW (ref 30.0–36.0)
MCV: 85.7 fL (ref 80.0–100.0)
Monocytes Absolute: 1 10*3/uL (ref 0.1–1.0)
Monocytes Relative: 8 %
Neutro Abs: 9 10*3/uL — ABNORMAL HIGH (ref 1.7–7.7)
Neutrophils Relative %: 73 %
Platelets: 388 10*3/uL (ref 150–400)
RBC: 3.77 MIL/uL — ABNORMAL LOW (ref 3.87–5.11)
RDW: 16.2 % — ABNORMAL HIGH (ref 11.5–15.5)
WBC: 12.3 10*3/uL — ABNORMAL HIGH (ref 4.0–10.5)
nRBC: 0.2 % (ref 0.0–0.2)

## 2018-08-08 LAB — URINALYSIS, ROUTINE W REFLEX MICROSCOPIC
Bilirubin Urine: NEGATIVE
Glucose, UA: NEGATIVE mg/dL
Hgb urine dipstick: NEGATIVE
Ketones, ur: NEGATIVE mg/dL
Leukocytes, UA: NEGATIVE
Nitrite: NEGATIVE
Protein, ur: NEGATIVE mg/dL
Specific Gravity, Urine: 1.011 (ref 1.005–1.030)
pH: 7 (ref 5.0–8.0)

## 2018-08-08 LAB — I-STAT BETA HCG BLOOD, ED (MC, WL, AP ONLY): I-stat hCG, quantitative: <5 m[IU]/mL (ref <5)

## 2018-08-08 LAB — LIPASE, BLOOD: Lipase: 25 U/L (ref 11–51)

## 2018-08-08 MED ORDER — IOPAMIDOL (ISOVUE-300) INJECTION 61%
INTRAVENOUS | Status: AC
  Filled 2018-08-08: qty 100

## 2018-08-08 MED ORDER — KETOROLAC TROMETHAMINE 30 MG/ML IJ SOLN
30.0000 mg | Freq: Once | INTRAMUSCULAR | Status: AC
  Administered 2018-08-08: 30 mg via INTRAVENOUS
  Filled 2018-08-08: qty 1

## 2018-08-08 MED ORDER — MORPHINE SULFATE (PF) 4 MG/ML IV SOLN
4.0000 mg | Freq: Once | INTRAVENOUS | Status: AC
  Administered 2018-08-08: 4 mg via INTRAVENOUS
  Filled 2018-08-08: qty 1

## 2018-08-08 MED ORDER — HYDROMORPHONE HCL 1 MG/ML IJ SOLN
1.0000 mg | Freq: Once | INTRAMUSCULAR | Status: AC
  Administered 2018-08-08: 1 mg via INTRAVENOUS
  Filled 2018-08-08: qty 1

## 2018-08-08 MED ORDER — SODIUM CHLORIDE (PF) 0.9 % IJ SOLN
INTRAMUSCULAR | Status: AC
  Filled 2018-08-08: qty 50

## 2018-08-08 MED ORDER — ONDANSETRON HCL 4 MG/2ML IJ SOLN
4.0000 mg | Freq: Once | INTRAMUSCULAR | Status: AC
  Administered 2018-08-08: 4 mg via INTRAVENOUS
  Filled 2018-08-08: qty 2

## 2018-08-08 MED ORDER — IOPAMIDOL (ISOVUE-300) INJECTION 61%
100.0000 mL | Freq: Once | INTRAVENOUS | Status: AC | PRN
  Administered 2018-08-08: 100 mL via INTRAVENOUS

## 2018-08-08 MED ORDER — OXYCODONE HCL 5 MG PO TABS: 5.0000 mg | ORAL_TABLET | Freq: Four times a day (QID) | ORAL | 0 refills | Status: DC | PRN

## 2018-08-08 NOTE — ED Provider Notes (Signed)
Received patient at signout from [facility].  Refer to provider note for full history and physical examination.  Briefly, patient is a 43 year old female with history of bipolar 1 disorder, depression, anxiety, hypertension presenting for evaluation of sudden onset abdominal pain beginning at around 11 AM today.  Has a known history of umbilical hernia.  Lab work significant for leukocytosis, anemia (patient has had iron transfusions in the past but has not had one in a while and has been instructed to follow-up with the provider managing her anemia).  No concern for acute GI bleed.  Pain has improved somewhat with Dilaudid.  Hernia is easily reducible but her pain does not improve significantly with this.  Pending CT scan for further evaluation.  May require general surgery consultation for further recommendations.  Physical Exam  BP (!) 141/69 (BP Location: Right Arm)   Pulse 82   Temp 98.1 F (36.7 C) (Oral)   Resp 16   Ht 5\' 5"  (1.651 m)   Wt (!) 155.1 kg   SpO2 96%   BMI 56.91 kg/m   Physical Exam Vitals signs and nursing note reviewed.  Constitutional:      General: She is not in acute distress.    Appearance: She is well-developed.  HENT:     Head: Normocephalic and atraumatic.  Eyes:     General:        Right eye: No discharge.        Left eye: No discharge.     Conjunctiva/sclera: Conjunctivae normal.  Neck:     Vascular: No JVD.     Trachea: No tracheal deviation.  Cardiovascular:     Rate and Rhythm: Normal rate.  Pulmonary:     Effort: Pulmonary effort is normal.  Abdominal:     General: Bowel sounds are normal. There is no distension.     Tenderness: There is generalized abdominal tenderness. There is no guarding or rebound.     Hernia: A hernia is present. Hernia is present in the umbilical area.     Comments: Umbilical hernia easily reducible.  Skin:    General: Skin is warm and dry.     Findings: No erythema.  Neurological:     Mental Status: She is alert.   Psychiatric:        Behavior: Behavior normal.     ED Course/Procedures     Hernia reduction Date/Time: 08/08/2018 8:33 PM Performed by: Renita Papa, PA-C Authorized by: Renita Papa, PA-C  Consent: Verbal consent obtained. Consent given by: patient Patient understanding: patient states understanding of the procedure being performed Patient consent: the patient's understanding of the procedure matches consent given Procedure consent: procedure consent matches procedure scheduled Relevant documents: relevant documents present and verified Test results: test results available and properly labeled Site marked: the operative site was marked Imaging studies: imaging studies available Required items: required blood products, implants, devices, and special equipment available Patient identity confirmed: verbally with patient Time out: Immediately prior to procedure a "time out" was called to verify the correct patient, procedure, equipment, support staff and site/side marked as required. Local anesthesia used: no (applied ice)  Anesthesia: Local anesthesia used: no (applied ice)  Sedation: Patient sedated: no  Patient tolerance: Patient tolerated the procedure well with no immediate complications     MDM  IMPRESSION: 1. Umbilical hernia containing a focally thickened loop of small bowel which may be inflamed or ischemic. However, there is no dilatation of the afferent loop at this time. Hernia  neck measures 2.2 by 2.2 cm. The overall herniated tissue measures up to 4.5 cm in diameter. 2. Multiple large uterine fibroids with partial obscuration of the ovaries. Trace amount of adjacent free pelvic fluid, uncertain significance. This represents a significant change compared to the 06/16/2012 exam, and I would suggest pelvic sonography follow up in the nonurgent setting. 3. Multiple new tiny hypodense lesions in the liver. There were about 3 such lesions on 06/16/2012 and about 8  today. These are probably cysts or similar benign lesions although for the most part are technically too small characterize. 4. Mild cardiomegaly. If this has not been previously worked up than cardiology referral may be warranted.    Discussed CT scan findings with patient including incidental findings of uterine fibroids, liver lesions, and mild cardiomegaly.  She understands to follow-up with her OB/GYN for the former and her PCP or cardiology for the latter.    Her hernia remains easily reducible however her pain has been somewhat difficult to control.  Will consult general surgery for further recommendations.  7:30PM Patient seen and evaluate by Dr. Ninfa Linden.  He will see the patient in the office on an outpatient basis on Tuesday.  He recommends discharge with a small amount of narcotic pain medicine.  Discussed management of symptoms with the patient and recommended avoiding activities that could cause increased intra-abdominal pressure.  Discussed strict ED return precautions. Pt verbalized understanding of and agreement with plan and is safe for discharge home at this time.        Debroah Baller 08/08/18 [id number]    Dorie Rank, MD 08/09/18 [id number]

## 2018-08-08 NOTE — ED Notes (Signed)
EDPA Provider at bedside. 

## 2018-08-08 NOTE — ED Provider Notes (Addendum)
[facility] DEPT Provider Note   CSN: [id number] Arrival date & time: 08/08/18  1210     History   Chief Complaint Chief Complaint  Patient presents with  . Abdominal Pain  . Nausea    HPI Christine [name] is a 43 y.o. female with a hx of bipolar 1 disorder, depression, anxiety, HTN, anemia, and PCOS who presents to the ED with complaints of abdominal pain that started at 10:50 AM. Patient states she was sitting on the couch when she developed sharp/stabbing pain to the periumbilical area. States pain has been constant since onset, severe in nature, worse with umbilical palpation, no alleviating factors. States has had some nausea without vomiting. Last BM was 0800 this AM. She states she has a known umbilical hernia that will be painful at times, but never this bad, she has been referred to surgery but has not seen them yet. Denies fever, diarrhea, melena, emesis, dysuria, chest pain, or dyspnea.   HPI  Past Medical History:  Diagnosis Date  . Anemia   . Anxiety   . Arthritis    Hands, ankles  . Bipolar 1 disorder ([place])   . Depression   . Fibroids   . Headache   . High blood pressure   . History of blood transfusion 08/2017   [facility]  . History of hiatal hernia   . Mental disorder   . PCOS (polycystic ovarian syndrome)   . Seasonal allergies     Patient Active Problem List   Diagnosis Date Noted  . Endometrium, polyp 01/15/2018    Class: Present on Admission  . Fibroids, submucosal 01/15/2018    Class: Present on Admission  . Iron deficiency anemia due to chronic blood loss 09/16/2017  . Menorrhagia with regular cycle   . Symptomatic anemia 08/28/2017  . Acute blood loss anemia 08/28/2017  . Bipolar disorder ([place]) 08/28/2017  . Anxiety 08/28/2017  . Depression 08/28/2017  . Fibroids 08/28/2017  . PCOS (polycystic ovarian syndrome) 08/28/2017  . Fever 08/28/2017  . HTN (hypertension) 08/28/2017  . Morbid obesity ([place]) 12/04/2015  .  Pregnancy with history of uterine myomectomy 04/07/2015  . History of right oophorectomy 02/21/2015  . History of recurrent miscarriages 02/01/2015  . Vitamin D deficiency 02/01/2015    Past Surgical History:  Procedure Laterality Date  . CERVICAL POLYPECTOMY  01/15/2018   Procedure: CERVICAL POLYPECTOMY;  Surgeon: Arvella Nigh, MD;  Location: [facility];  Service: Gynecology;;  . COLONOSCOPY  2008   Normal  . DIAGNOSTIC LAPAROSCOPY  08/1999   dermoid cyst, RSO  . DILATION AND CURETTAGE OF UTERUS  05/2004   MAB  . DILATION AND CURETTAGE OF UTERUS  04/2015  . HYSTEROSCOPY W/D&C  07/22/2011   Procedure: DILATATION AND CURETTAGE /HYSTEROSCOPY;  Surgeon: Cyril Mourning, MD;  Location: [facility];  Service: Gynecology;;  . HYSTEROSCOPY W/D&C N/A 01/15/2018   Procedure: DILATATION AND CURETTAGE [place] WITH MYOSURE;  Surgeon: Arvella Nigh, MD;  Location: [facility];  Service: Gynecology;  Laterality: N/A;     OB History   No obstetric history on file.      Home Medications    Prior to Admission medications   Medication Sig Start Date End Date Taking? Authorizing Provider  amLODipine (NORVASC) 10 MG tablet Take 1 tablet (10 mg total) by mouth daily. 08/29/17   Eugenie Filler, MD  Brexpiprazole ([name]) 2 MG TABS Take 2 mg by mouth daily. 06/22/18   [name], [name], MD  clonazePAM (KLONOPIN) 0.5 MG  tablet Take 1 tablet (0.5 mg total) by mouth 2 (two) times daily as needed for anxiety. 06/22/18   [name], [name], MD  fluticasone (FLONASE) 50 MCG/ACT nasal spray Place 2 sprays into both nostrils daily as needed for allergies.     [provider]  lamoTRIgine (LAMICTAL) 150 MG tablet Take 2 tablets (300 mg total) by mouth daily. 06/22/18   [name], [name], MD  lithium carbonate (ESKALITH) 450 MG CR tablet Take two tab in AM and Two tab in PM 06/22/18   [name], [name], MD  meloxicam (MOBIC) 7.5 MG tablet Take 7.5 mg by mouth 2 (two) times daily as needed for pain.  12/31/16   [provider]    Family History Family History  Problem Relation Age of Onset  . Anxiety disorder Mother   . Depression Mother   . Cancer Father        colon  . Cancer Brother 6       leukemia, childhood    Social History Social History   Tobacco Use  . Smoking status: Never Smoker  . Smokeless tobacco: Never Used  Substance Use Topics  . Alcohol use: Yes    Comment: occasional  . Drug use: No     Allergies   Penicillins   Review of Systems Review of Systems  Constitutional: Negative for chills and fever.  Respiratory: Negative for shortness of breath.   Cardiovascular: Negative for chest pain.  Gastrointestinal: Positive for abdominal pain and nausea. Negative for blood in stool, constipation, diarrhea and vomiting.  Genitourinary: Negative for dysuria, vaginal bleeding and vaginal discharge.  All other systems reviewed and are negative.    Physical Exam Updated Vital Signs BP 129/72   Pulse 91   Temp 98.1 F (36.7 C) (Oral)   Resp (!) 26   Ht 5\' 5"  (1.651 m)   Wt (!) 155.1 kg   SpO2 97%   BMI 56.91 kg/m   Physical Exam Vitals signs and nursing note reviewed.  Constitutional:      General: She is not in acute distress.    Appearance: She is well-developed. She is not toxic-appearing.  HENT:     Head: Normocephalic and atraumatic.  Eyes:     General:        Right eye: No discharge.        Left eye: No discharge.     Conjunctiva/sclera: Conjunctivae normal.  Neck:     Musculoskeletal: Neck supple.  Cardiovascular:     Rate and Rhythm: Normal rate and regular rhythm.  Pulmonary:     Effort: Pulmonary effort is normal. No respiratory distress.     Breath sounds: Normal breath sounds. No wheezing, rhonchi or rales.  Abdominal:     General: There is no distension.     Palpations: Abdomen is soft.     Tenderness: There is generalized abdominal tenderness (most prominent at the periumbilical area and just superior to this).     Hernia: A hernia  (Palpable umbilical hernia which is soft and reducible without much change in patient's pain after reduction. ) is present. Hernia is present in the umbilical area.     Comments: Area just superior to the umbilicus is somewhat firm/hard to palpation.   Skin:    General: Skin is warm and dry.     Findings: No rash.  Neurological:     Mental Status: She is alert.     Comments: Clear speech.   Psychiatric:  Behavior: Behavior normal.    ED Treatments / Results  Labs (all labs ordered are listed, but only abnormal results are displayed) Labs Reviewed  COMPREHENSIVE METABOLIC PANEL - Abnormal; Notable for the following components:      Result Value   CO2 21 (*)    AST 11 (*)    All other components within normal limits  CBC WITH DIFFERENTIAL/PLATELET - Abnormal; Notable for the following components:   WBC 12.3 (*)    RBC 3.77 (*)    Hemoglobin 9.2 (*)    HCT 32.3 (*)    MCH 24.4 (*)    MCHC 28.5 (*)    RDW 16.2 (*)    Neutro Abs 9.0 (*)    Abs Immature Granulocytes 0.23 (*)    All other components within normal limits  LIPASE, BLOOD  URINALYSIS, ROUTINE W REFLEX MICROSCOPIC  I-STAT BETA HCG BLOOD, ED ([facility], [facility], [facility] ONLY)    EKG None  Radiology No results found.  Procedures Procedures (including critical care time)  Medications Ordered in ED Medications  iopamidol (ISOVUE-300) 61 % injection (has no administration in time range)  sodium chloride (PF) 0.9 % injection (has no administration in time range)  morphine 4 MG/ML injection 4 mg (4 mg Intravenous Given 08/08/18 1414)  ondansetron (ZOFRAN) injection 4 mg (4 mg Intravenous Given 08/08/18 1414)  HYDROmorphone (DILAUDID) injection 1 mg (1 mg Intravenous Given 08/08/18 1543)     Initial Impression / Assessment and Plan / ED Course  I have reviewed the triage vital signs and the nursing notes.  Pertinent labs & imaging results that were available during my care of the patient were reviewed by me and considered in my  medical decision making (see chart for details).   Patient presents to the emergency department with complaint of abdominal pain. Nontoxic appearing, vitals WNL. Exam with generalized tenderness with most focal tenderness to the periumbilical area and the area just superior to this. Umbilical hernia is present and easily reducible without relief of patient's pain, area just superior to this is very tender and firm to palpation concern for ventral hernia with possibility of incarceration. Will further evaluate with labs & CT abdomen/pelvis. Analgesics & anti-emetics ordered. Findings and plan of care discussed with supervising physician Dr. Maryan Rued who personally evaluated and examined this patient and is in agreement    Labs reviewed: Leukocytosis @12 .3. Anemia w/ hgb of 9.2 which is decreased from prior, I discussed with the patient- she has a hx of iron deficiency anemia secondary to heavy periods, receives iron transfusions intermittently, and has not had one in a few months, denies melena/hematochezia. No significant electrolyte disturbance. LFTs are not elevated. Lipase WNL. Renal function preserved. Urinalysis without infection. Pregnancy test is negative- doubt ectopic. Pain seems more abdominal as opposed to pelvic in nature therefore pelvic exam was deferred initially, low suspicion for PID.   15:30: RE-EVAL: Patient remains uncomfortable, states no significant change in pain with morphine. Dilaudid ordered. Pending CT imaging.   16:00 Patient signed out to [facility] PA-C at change of shift. If CT scan without concerning findings patient can likely be discharged home with general surgery follow up.   Final Clinical Impressions(s) / ED Diagnoses   Final diagnoses:  None    ED Discharge Orders    None       Amaryllis Dyke, PA-C 08/08/18 [address] PA-C 08/08/18 1727    Blanchie Dessert, MD 08/10/18 2217

## 2018-08-08 NOTE — ED Notes (Signed)
Bed: [facility] Expected date:  Expected time:  Means of arrival:  Comments: EMS Abdominal pain

## 2018-08-08 NOTE — ED Triage Notes (Signed)
Patient is from home and complains of sharp abdominal pain around the umbilical area. This pain start about 1 hour ago. She has a history of a hernia and a referral to a Psychologist, sport and exercise. The patient reports the area is tender to touch and movement is difficult due to pain. The pain has also brought on nausea.

## 2018-08-08 NOTE — Discharge Instructions (Addendum)
Alternate 600 mg of ibuprofen and [phone] mg of Tylenol every 3 hours as needed for pain. Do not exceed 4000 mg of Tylenol daily.  Take ibuprofen with food to avoid upset stomach issues.  You can take oxycodone as needed for severe pain but do not drive, drink alcohol, operate heavy machinery while taking this medication as it may make you drowsy.  You can also cut these tablets in half.  Apply ice or heat to the abdomen (whichever feels best) for comfort.  Eat a diet of bland foods that will not upset your stomach.  Avoid increased pressure to the abdomen including coughing or heavy lifting.  Return to the emergency department immediately for any concerning signs or symptoms develop such as fevers, persistent vomiting, worsening pain, or if you are unable to reduce the hernia (put the bowels back in place).   Call Dr. Trevor Mace office on Monday to set up a follow-up appointment for Tuesday.

## 2018-08-08 NOTE — ED Notes (Signed)
Bed: WA23 Expected date:  Expected time:  Means of arrival:  Comments: Hold for triage 2 

## 2018-08-08 NOTE — Consult Note (Signed)
Reason for Consult:umbilical hernia Referring Physician: Dr. Dorie Rank  Christine [name] is an 43 y.o. female.  HPI: This is a 43 year old female with multiple medical issues and a known umbilical hernia that presented today with a sudden onset of pain at the umbilicus.  She had a bowel movement earlier that day.  She denies nausea or vomiting.  The pain is focal to the umbilicus.  She has been here for about 7-1/2 hours in the emergency department.  Since arrival, the hernia has been reduced.  Prior to this, she had a CT scan of the abdomen and pelvis showing a hernia containing small bowel with possible mild inflammation.  I reviewed former CT scans of hers as well showing an umbilical hernia.  She is otherwise without complaints.  She has no obstructive symptoms.  Past Medical History:  Diagnosis Date  . Anemia   . Anxiety   . Arthritis    Hands, ankles  . Bipolar 1 disorder ([place])   . Depression   . Fibroids   . Headache   . High blood pressure   . History of blood transfusion 08/2017   [facility]  . History of hiatal hernia   . Mental disorder   . PCOS (polycystic ovarian syndrome)   . Seasonal allergies     Past Surgical History:  Procedure Laterality Date  . CERVICAL POLYPECTOMY  01/15/2018   Procedure: CERVICAL POLYPECTOMY;  Surgeon: Arvella Nigh, MD;  Location: [facility];  Service: Gynecology;;  . COLONOSCOPY  2008   Normal  . DIAGNOSTIC LAPAROSCOPY  08/1999   dermoid cyst, RSO  . DILATION AND CURETTAGE OF UTERUS  05/2004   MAB  . DILATION AND CURETTAGE OF UTERUS  04/2015  . HYSTEROSCOPY W/D&C  07/22/2011   Procedure: DILATATION AND CURETTAGE /HYSTEROSCOPY;  Surgeon: Cyril Mourning, MD;  Location: [facility];  Service: Gynecology;;  . HYSTEROSCOPY W/D&C N/A 01/15/2018   Procedure: DILATATION AND CURETTAGE [place] WITH MYOSURE;  Surgeon: Arvella Nigh, MD;  Location: [facility];  Service: Gynecology;  Laterality: N/A;    Family History  Problem Relation Age of Onset  . Anxiety  disorder Mother   . Depression Mother   . Cancer Father        colon  . Cancer Brother 6       leukemia, childhood    Social History:  reports that she has never smoked. She has never used smokeless tobacco. She reports current alcohol use. She reports that she does not use drugs.  Allergies:  Allergies  Allergen Reactions  . Penicillins Rash and Other (See Comments)    Has patient had a PCN reaction causing immediate rash, facial/tongue/throat swelling, SOB or lightheadedness with hypotension: Yes Has patient had a PCN reaction causing severe rash involving mucus membranes or skin necrosis: Unknown Has patient had a PCN reaction that required hospitalization: No Has patient had a PCN reaction occurring within the last 10 years: No If all of the above answers are "NO", then may proceed with Cephalosporin use.     Medications: I have reviewed the patient's current medications.  Results for orders placed or performed during the hospital encounter of 08/08/18 (from the past 48 hour(s))  Comprehensive metabolic panel     Status: Abnormal   Collection Time: 08/08/18  1:12 PM  Result Value Ref Range   Sodium 138 135 - 145 mmol/L   Potassium 3.8 3.5 - 5.1 mmol/L   Chloride 109 98 - 111 mmol/L   CO2 21 (L) 22 -  32 mmol/L   Glucose, Bld 85 70 - 99 mg/dL   BUN 8 6 - 20 mg/dL   Creatinine, Ser 0.83 0.44 - 1.00 mg/dL   Calcium 9.0 8.9 - 10.3 mg/dL   Total Protein 7.0 6.5 - 8.1 g/dL   Albumin 3.8 3.5 - 5.0 g/dL   AST 11 (L) 15 - 41 U/L   ALT 17 0 - 44 U/L   Alkaline Phosphatase 58 38 - 126 U/L   Total Bilirubin 0.4 0.3 - 1.2 mg/dL   GFR calc non Af Amer >60 >60 mL/min   GFR calc Af Amer >60 >60 mL/min   Anion gap 8 5 - 15    Comment: Performed at [facility], [place] [address], [place], [place] [address]  CBC with Differential     Status: Abnormal   Collection Time: 08/08/18  1:12 PM  Result Value Ref Range   WBC 12.3 (H) 4.0 - 10.5 K/uL   RBC 3.77 (L) 3.87 -  5.11 MIL/uL   Hemoglobin 9.2 (L) 12.0 - 15.0 g/dL   HCT 32.3 (L) 36.0 - 46.0 %   MCV 85.7 80.0 - 100.0 fL   MCH 24.4 (L) 26.0 - 34.0 pg   MCHC 28.5 (L) 30.0 - 36.0 g/dL   RDW 16.2 (H) 11.5 - 15.5 %   Platelets 388 150 - 400 K/uL   nRBC 0.2 0.0 - 0.2 %   Neutrophils Relative % 73 %   Neutro Abs 9.0 (H) 1.7 - 7.7 K/uL   Lymphocytes Relative 16 %   Lymphs Abs 1.9 0.7 - 4.0 K/uL   Monocytes Relative 8 %   Monocytes Absolute 1.0 0.1 - 1.0 K/uL   Eosinophils Relative 1 %   Eosinophils Absolute 0.1 0.0 - 0.5 K/uL   Basophils Relative 0 %   Basophils Absolute 0.0 0.0 - 0.1 K/uL   Immature Granulocytes 2 %   Abs Immature Granulocytes 0.23 (H) 0.00 - 0.07 K/uL    Comment: Performed at [facility], [place] [address], [place], [place] [address]  Lipase, blood     Status: None   Collection Time: 08/08/18  1:12 PM  Result Value Ref Range   Lipase 25 11 - 51 U/L    Comment: Performed at [facility], [place] [address], [place], [place] [address]  Urinalysis, Routine w reflex microscopic     Status: None   Collection Time: 08/08/18  1:12 PM  Result Value Ref Range   Color, Urine YELLOW YELLOW   APPearance CLEAR CLEAR   Specific Gravity, Urine 1.011 1.005 - 1.030   pH 7.0 5.0 - 8.0   Glucose, UA NEGATIVE NEGATIVE mg/dL   Hgb urine dipstick NEGATIVE NEGATIVE   Bilirubin Urine NEGATIVE NEGATIVE   Ketones, ur NEGATIVE NEGATIVE mg/dL   Protein, ur NEGATIVE NEGATIVE mg/dL   Nitrite NEGATIVE NEGATIVE   Leukocytes, UA NEGATIVE NEGATIVE    Comment: Performed at [place] [address] [place], [place] [address]  I-Stat beta hCG blood, ED     Status: None   Collection Time: 08/08/18  2:25 PM  Result Value Ref Range   I-stat hCG, quantitative <5.0 <5 mIU/mL   Comment 3            Comment:   GEST. AGE      CONC.  (mIU/mL)   <=1 WEEK        5 - 50     2 WEEKS  50 - 500     3 WEEKS       100 - 10,000     4 WEEKS     1,000 - 30,000         FEMALE AND NON-PREGNANT FEMALE:     LESS THAN 5 mIU/mL     Ct Abdomen Pelvis W Contrast  Result Date: 08/08/2018 CLINICAL DATA:  Periumbilical abdominal pain starting 1 hour prior to arrival at the emergency department. The patient has a history of a hernia. Focal tenderness in the periumbilical region. Nausea. EXAM: CT ABDOMEN AND PELVIS WITH CONTRAST TECHNIQUE: Multidetector CT imaging of the abdomen and pelvis was performed using the standard protocol following bolus administration of intravenous contrast. CONTRAST:  189mL ISOVUE-300 IOPAMIDOL (ISOVUE-300) INJECTION 61% COMPARISON:  06/16/2012 CT abdomen FINDINGS: Lower chest: Mild cardiomegaly. Linear subsegmental atelectasis in both lower lobes. Hepatobiliary: Total of 8 small hypodense lesions are present in the liver, probably cysts or similar benign lesions but technically nonspecific. Only 3 hypodense lesions were present on 06/16/2012. For example, a lesion in the medial segment left hepatic lobe currently measuring 1.1 by 0.7 cm previously measured 0.5 cm diameter. Clearly, some of the lesions are new. Most of these lesions are technically too small to characterize. Gallbladder unremarkable.  No biliary dilatation. Pancreas: Unremarkable Spleen: Unremarkable Adrenals/Urinary Tract: Unremarkable Stomach/Bowel: There is a thickened loop of small bowel in an umbilical hernia, with suspected edema in the wall of the herniated loop of small bowel but no current obstruction. Given the degree of wall thickening, ischemia of the bowel loop is not readily excluded. The hernia neck measures 2.2 by 2.2 cm in diameter and the overall hernia contents measure 4.1 by 4.3 by 4.5 cm. Vascular/Lymphatic: Unremarkable Reproductive: Multiple new and enlarging uterine fibroids are present, and include intramural fibroids along the uterine body as well as suspected subserosal fibroids along the adnexa. I suspect that the left ovary is the 4.3 by 3.3 cm structure to  the left of the uterus on image 58/2, although there is an adjacent 5.4 by 4.6 cm lesion on image 61 which is probably a subserosal fibroid. The masslike lesion along the right adnexa measuring 8.1 by 7.7 cm on image 60/2 appears to likely have an internal morphology favoring fibroid over an ovarian mass. Right ovary is difficult to observed but may be slightly anterior, although that structure may be another fibroid. There is a small amount of free pelvic fluid in both the right lower quadrant and left lower quadrant adjacent to the presumed fibroids. Low-density in the cervix likely from nabothian cysts. Other: No supplemental non-categorized findings. Musculoskeletal: Umbilical hernia as detailed in the stomach/bowel section above. IMPRESSION: 1. Umbilical hernia containing a focally thickened loop of small bowel which may be inflamed or ischemic. However, there is no dilatation of the afferent loop at this time. Hernia neck measures 2.2 by 2.2 cm. The overall herniated tissue measures up to 4.5 cm in diameter. 2. Multiple large uterine fibroids with partial obscuration of the ovaries. Trace amount of adjacent free pelvic fluid, uncertain significance. This represents a significant change compared to the 06/16/2012 exam, and I would suggest pelvic sonography follow up in the nonurgent setting. 3. Multiple new tiny hypodense lesions in the liver. There were about 3 such lesions on 06/16/2012 and about 8 today. These are probably cysts or similar benign lesions although for the most part are technically too small characterize. 4. Mild cardiomegaly. If this has not been previously worked up than  cardiology referral may be warranted. Electronically Signed   By: Van Clines M.D.   On: 08/08/2018 16:26    ROS Blood pressure (!) 128/57, pulse 78, temperature 98.8 F (37.1 C), temperature source Oral, resp. rate 17, height 5\' 5"  (1.651 m), weight (!) 155.1 kg, SpO2 98 %. Physical Exam  Constitutional: She  is oriented to person, place, and time. She appears well-developed and well-nourished. No distress.  Morbidly obese  HENT:  Head: Normocephalic and atraumatic.  Right Ear: External ear normal.  Left Ear: External ear normal.  Mouth/Throat: No oropharyngeal exudate.  Eyes: Pupils are equal, round, and reactive to light. Right eye exhibits no discharge. Left eye exhibits no discharge. No scleral icterus.  Neck: Normal range of motion. No tracheal deviation present.  Cardiovascular: Normal rate and regular rhythm.  No murmur heard. Respiratory: Effort normal and breath sounds normal. She exhibits no tenderness.  GI: Soft. There is abdominal tenderness. There is no rebound.  There is a fascial defect at the umbilicus.  It is approximately 2-1/2 cm in size circumferentially.  There is currently no bowel in the hernia.  When she sits up, the sac protrudes but I am actually able to pull the skin to the side and confirm there is no bowel in the hernia.  Musculoskeletal: Normal range of motion.        General: No tenderness or edema.  Neurological: She is alert and oriented to person, place, and time.  Skin: Skin is warm and dry. No erythema.  Psychiatric: Her behavior is normal. Judgment normal.    Assessment/Plan: Umbilical hernia  This is a very easy to reduce umbilical hernia.  Emergent repair of this does not need to be done currently.  Again, there is no more bowel in the hernia and her fascia is easy to examine and is quite superficial despite her morbid obesity.  I discussed this with her in detail.  I recommend she see me back in our office in 3 days and we can then arrange surgery as an outpatient.  I discussed the diagnosis with her in detail and the plans and she agrees.  Christine [name] 08/08/2018, 7:38 PM

## 2018-08-08 NOTE — ED Notes (Signed)
Patient transported to CT 

## 2018-08-11 ENCOUNTER — Other Ambulatory Visit: Payer: Self-pay | Admitting: Surgery

## 2018-08-11 DIAGNOSIS — K429 Umbilical hernia without obstruction or gangrene: Secondary | ICD-10-CM | POA: Diagnosis not present

## 2018-08-12 ENCOUNTER — Ambulatory Visit (HOSPITAL_COMMUNITY): Payer: BC Managed Care – PPO | Admitting: Psychiatry

## 2018-08-13 DIAGNOSIS — R05 Cough: Secondary | ICD-10-CM | POA: Diagnosis not present

## 2018-08-13 DIAGNOSIS — R062 Wheezing: Secondary | ICD-10-CM | POA: Diagnosis not present

## 2018-08-13 DIAGNOSIS — J209 Acute bronchitis, unspecified: Secondary | ICD-10-CM | POA: Diagnosis not present

## 2018-08-18 NOTE — Pre-Procedure Instructions (Signed)
Christine [name]  08/18/2018      [facility] [place], [address] [place] [address] Phone: [phone] Fax: [phone]    Your procedure is scheduled on Thursday January 16th.  Report to [facility] Admitting at 7:00 A.M.  Call this number if you have problems the morning of surgery:  [phone]   Remember:  Do not eat after midnight. You may drink clear liquids until 6:00 AM. Clear liquids include water, non-citrus juices without pulp, carbonated beverages, clear tea, black coffee and gatorade.    Take these medicines the morning of surgery with A SIP OF WATER  amLODipine (NORVASC)  Brexpiprazole ([name])  lamoTRIgine (LAMICTAL)  lithium carbonate (ESKALITH) metoprolol succinate (TOPROL-XL) topiramate (TOPAMAX)  oxyCODONE (ROXICODONE)-if needed for pain clonazePAM (KLONOPIN) if needed for anxiety    Do not wear jewelry, make-up or nail polish.  Do not wear lotions, powders, or perfumes, or deodorant.  Do not shave 48 hours prior to surgery.    Do not bring valuables to the hospital.  [facility] is not responsible for any belongings or valuables.  Contacts, eyeglasses, hearing aids, dentures or bridgework may not be worn into surgery.  Leave your suitcase in the car.  After surgery it may be brought to your room.  For patients admitted to the hospital, discharge time will be determined by your treatment team.  Patients discharged the day of surgery will not be allowed to drive home.   [place]- Preparing For Surgery  Before surgery, you can play an important role. Because skin is not sterile, your skin needs to be as free of germs as possible. You can reduce the number of germs on your skin by washing with CHG (chlorahexidine gluconate) Soap before surgery.  CHG is an antiseptic cleaner which kills germs and bonds with the skin to continue killing germs  even after washing.    Oral Hygiene is also important to reduce your risk of infection.  Remember - BRUSH YOUR TEETH THE MORNING OF SURGERY WITH YOUR REGULAR TOOTHPASTE  Please do not use if you have an allergy to CHG or antibacterial soaps. If your skin becomes reddened/irritated stop using the CHG.  Do not shave (including legs and underarms) for at least 48 hours prior to first CHG shower. It is OK to shave your face.  Please follow these instructions carefully.   1. Shower the NIGHT BEFORE SURGERY and the MORNING OF SURGERY with CHG.   2. If you chose to wash your hair, wash your hair first as usual with your normal shampoo.  3. After you shampoo, rinse your hair and body thoroughly to remove the shampoo.  4. Use CHG as you would any other liquid soap. You can apply CHG directly to the skin and wash gently with a scrungie or a clean washcloth.   5. Apply the CHG Soap to your body ONLY FROM THE NECK DOWN.  Do not use on open wounds or open sores. Avoid contact with your eyes, ears, mouth and genitals (private parts). Wash Face and genitals (private parts)  with your normal soap.  6. Wash thoroughly, paying special attention to the area where your surgery will be performed.  7. Thoroughly rinse your body with warm water from the neck down.  8. DO NOT shower/wash with your normal soap after using and rinsing off the CHG Soap.  9. Pat yourself  dry with a CLEAN TOWEL.  10. Wear CLEAN PAJAMAS to bed the night before surgery, wear comfortable clothes the morning of surgery  11. Place CLEAN SHEETS on your bed the night of your first shower and DO NOT SLEEP WITH PETS.    Day of Surgery: Shower as stated above. Do not apply any deodorants/lotions.  Please wear clean clothes to the hospital/surgery center.   Remember to brush your teeth WITH YOUR REGULAR TOOTHPASTE.   Please read over the following fact sheets that you were given.

## 2018-08-19 ENCOUNTER — Encounter (HOSPITAL_COMMUNITY): Payer: Self-pay

## 2018-08-19 ENCOUNTER — Encounter (HOSPITAL_COMMUNITY): Payer: Self-pay | Admitting: Physician Assistant

## 2018-08-19 ENCOUNTER — Encounter (HOSPITAL_COMMUNITY)
Admission: RE | Admit: 2018-08-19 | Discharge: 2018-08-19 | Disposition: A | Discharge status: Home / Self Care | Payer: Medicare Other | Source: Ambulatory Visit | Attending: Orthopaedic Surgery | Admitting: Orthopaedic Surgery

## 2018-08-19 ENCOUNTER — Other Ambulatory Visit: Payer: Self-pay

## 2018-08-19 DIAGNOSIS — Z7984 Long term (current) use of oral hypoglycemic drugs: Secondary | ICD-10-CM | POA: Diagnosis not present

## 2018-08-19 DIAGNOSIS — R000 Tachycardia, unspecified: Secondary | ICD-10-CM | POA: Diagnosis not present

## 2018-08-19 DIAGNOSIS — Z7901 Long term (current) use of anticoagulants: Secondary | ICD-10-CM | POA: Insufficient documentation

## 2018-08-19 DIAGNOSIS — Z01812 Encounter for preprocedural laboratory examination: Secondary | ICD-10-CM | POA: Diagnosis not present

## 2018-08-19 DIAGNOSIS — M1990 Unspecified osteoarthritis, unspecified site: Secondary | ICD-10-CM | POA: Insufficient documentation

## 2018-08-19 DIAGNOSIS — D259 Leiomyoma of uterus, unspecified: Secondary | ICD-10-CM | POA: Diagnosis not present

## 2018-08-19 DIAGNOSIS — R51 Headache: Secondary | ICD-10-CM | POA: Diagnosis not present

## 2018-08-19 DIAGNOSIS — Z79899 Other long term (current) drug therapy: Secondary | ICD-10-CM | POA: Insufficient documentation

## 2018-08-19 DIAGNOSIS — I10 Essential (primary) hypertension: Secondary | ICD-10-CM | POA: Diagnosis not present

## 2018-08-19 DIAGNOSIS — R918 Other nonspecific abnormal finding of lung field: Secondary | ICD-10-CM | POA: Insufficient documentation

## 2018-08-19 DIAGNOSIS — K429 Umbilical hernia without obstruction or gangrene: Secondary | ICD-10-CM | POA: Insufficient documentation

## 2018-08-19 DIAGNOSIS — F319 Bipolar disorder, unspecified: Secondary | ICD-10-CM | POA: Diagnosis not present

## 2018-08-19 LAB — CBC
HCT: 35.5 % — ABNORMAL LOW (ref 36.0–46.0)
Hemoglobin: 9.8 g/dL — ABNORMAL LOW (ref 12.0–15.0)
MCH: 23.3 pg — ABNORMAL LOW (ref 26.0–34.0)
MCHC: 27.6 g/dL — ABNORMAL LOW (ref 30.0–36.0)
MCV: 84.3 fL (ref 80.0–100.0)
Platelets: 340 10*3/uL (ref 150–400)
RBC: 4.21 MIL/uL (ref 3.87–5.11)
RDW: 16.7 % — ABNORMAL HIGH (ref 11.5–15.5)
WBC: 9.1 10*3/uL (ref 4.0–10.5)
nRBC: 0 % (ref 0.0–0.2)

## 2018-08-19 LAB — BASIC METABOLIC PANEL
Anion gap: 9 (ref 5–15)
BUN: 8 mg/dL (ref 6–20)
CO2: 21 mmol/L — ABNORMAL LOW (ref 22–32)
Calcium: 9.4 mg/dL (ref 8.9–10.3)
Chloride: 110 mmol/L (ref 98–111)
Creatinine, Ser: 0.85 mg/dL (ref 0.44–1.00)
GFR calc Af Amer: >60 mL/min (ref >60)
GFR calc non Af Amer: >60 mL/min (ref >60)
Glucose, Bld: 93 mg/dL (ref 70–99)
Potassium: 4.1 mmol/L (ref 3.5–5.1)
Sodium: 140 mmol/L (ref 135–145)

## 2018-08-19 NOTE — Progress Notes (Signed)
Anesthesia Chart Review:  Case:  [id number] Date/Time:  09/10/18 1045   Procedures:      UMBILICAL HERNIA REPAIR WITH MESH (N/A )     INSERTION OF MESH (N/A )   Anesthesia type:  General   Pre-op diagnosis:  UMBILICAL HERNIA   Location:  [place] OR ROOM 09 / [place] OR   Surgeon:  Coralie Keens, MD      DISCUSSION: 43 yo female never smoker. Pertinent hx includes Bipolar 1, Anemia, Depression, HTN, HA, PCOS.  I was called to see pt at her PAT appt due to her report of recent respiratory infection. Pt says she developed productive cough on 1/5 that did not respond to conservative care. She was seen by her PCP on 1/9 and says she had diffuse wheezing and was started on azithromycin and albuterol inhaler. She reports that her cough is not as productive as previous, but she continues to have pretty significant fits of coughing. She does endorse a hx of recurrent bronchitis and PNA. Denies history of COPD or asthma. On exam she is afebrile, vital signs WNL. Lung auscultation reveals diffuse expiratory wheeze in all lung fields. She did have several significant paroxysms of coughing during the exam. We discussed that she is not currently optimized for elective surgery and I'm concerned that these paroxysms of coughing would complicate recovery from hernia repair. Pt in agreement and will call her PCP today. I called the triage nurse at [facility] to let them know the situation. Pt has been rescheduled to 09/10/2018.  VS: BP 138/88   Pulse 93   Temp 36.7 C   Ht 5\' 5"  (1.651 m)   Wt (!) 153.5 kg   LMP 07/25/2018 (Exact Date)   SpO2 96%   BMI 56.33 kg/m   PROVIDERS: Scifres, Dorothy, PA-C is PCP   LABS: Labs reviewed: Acceptable for surgery. Hx of Anemia (all labs ordered are listed, but only abnormal results are displayed)  Labs Reviewed  BASIC METABOLIC PANEL - Abnormal; Notable for the following components:      Result Value   CO2 21 (*)    All other components within normal limits  CBC - Abnormal;  Notable for the following components:   Hemoglobin 9.8 (*)    HCT 35.5 (*)    MCH 23.3 (*)    MCHC 27.6 (*)    RDW 16.7 (*)    All other components within normal limits     IMAGES: CT abd/pelvis 08/08/2018: IMPRESSION: 1. Umbilical hernia containing a focally thickened loop of small bowel which may be inflamed or ischemic. However, there is no dilatation of the afferent loop at this time. Hernia neck measures 2.2 by 2.2 cm. The overall herniated tissue measures up to 4.5 cm in diameter. 2. Multiple large uterine fibroids with partial obscuration of the ovaries. Trace amount of adjacent free pelvic fluid, uncertain significance. This represents a significant change compared to the 06/16/2012 exam, and I would suggest pelvic sonography follow up in the nonurgent setting. 3. Multiple new tiny hypodense lesions in the liver. There were about 3 such lesions on 06/16/2012 and about 8 today. These are probably cysts or similar benign lesions although for the most part are technically too small characterize. 4. Mild cardiomegaly. If this has not been previously worked up than cardiology referral may be warranted.  CHEST - 2 VIEW 04/13/2018:  COMPARISON:  Prior radiograph from 08/28/2017.  FINDINGS: Cardiac and mediastinal silhouettes are stable in size and contour, and remain within normal limits.  Lungs normally inflated. There are hazy and patchy bibasilar opacities, right slightly greater than left. While these findings could in part reflect atelectasis, possible infiltrates could be considered in the correct clinical setting. No other focal airspace disease. No pulmonary edema or pleural effusion. No pneumothorax.  No acute osseus abnormality.  IMPRESSION: Mild hazy and patchy bibasilar opacities, right greater than left. While these findings may in part reflect atelectatic changes, possible infiltrates could also have this appearance, particularly at the right lung  base.  EKG: 08/28/2017: Sinus tachycardia. Rate 108. Probable left atrial enlargement  CV: TTE 08/29/2017: Left ventricle:  The cavity size was normal. Systolic function was vigorous. The estimated ejection fraction was in the range of 65% to 70%. Wall motion was normal; there were no regional wall motion abnormalities. The transmitral flow pattern was normal. The deceleration time of the early transmitral flow velocity was normal. The pulmonary vein flow pattern was normal. The tissue Doppler parameters were normal. Left ventricular diastolic function parameters were normal.  Past Medical History:  Diagnosis Date  . Anemia   . Anxiety   . Arthritis    Hands, ankles  . Bipolar 1 disorder ([place])   . Depression   . Fibroids   . Headache   . High blood pressure   . History of blood transfusion 08/2017   [facility]  . History of hiatal hernia   . Mental disorder   . PCOS (polycystic ovarian syndrome)   . Seasonal allergies     Past Surgical History:  Procedure Laterality Date  . CERVICAL POLYPECTOMY  01/15/2018   Procedure: CERVICAL POLYPECTOMY;  Surgeon: Arvella Nigh, MD;  Location: [facility];  Service: Gynecology;;  . COLONOSCOPY  2008   Normal  . DIAGNOSTIC LAPAROSCOPY  08/1999   dermoid cyst, RSO  . DILATION AND CURETTAGE OF UTERUS  05/2004   MAB  . DILATION AND CURETTAGE OF UTERUS  04/2015  . HYSTEROSCOPY W/D&C  07/22/2011   Procedure: DILATATION AND CURETTAGE /HYSTEROSCOPY;  Surgeon: Cyril Mourning, MD;  Location: [facility];  Service: Gynecology;;  . HYSTEROSCOPY W/D&C N/A 01/15/2018   Procedure: DILATATION AND CURETTAGE [place] WITH MYOSURE;  Surgeon: Arvella Nigh, MD;  Location: [facility];  Service: Gynecology;  Laterality: N/A;    MEDICATIONS: . Albuterol Sulfate (PROAIR HFA IN)  . amLODipine (NORVASC) 10 MG tablet  . Brexpiprazole ([name]) 2 MG TABS  . clonazePAM (KLONOPIN) 0.5 MG tablet  . lamoTRIgine (LAMICTAL) 150 MG tablet  . lithium carbonate (ESKALITH) 450 MG  CR tablet  . metoprolol succinate (TOPROL-XL) 50 MG 24 hr tablet  . oxyCODONE (ROXICODONE) 5 MG immediate release tablet  . topiramate (TOPAMAX) 50 MG tablet   No current facility-administered medications for this encounter.     [facility] Short Stay Center/Anesthesiology Phone [phone] 08/20/2018 5:18 PM

## 2018-08-19 NOTE — Progress Notes (Signed)
PCP - Maude Leriche, PA-C; Sadie Haber at [facility] Cardiologist - denies  Chest x-ray - 04/13/18 EKG - 08/29/17 Stress Test - denies ECHO - 08/29/17 Cardiac Cath - denies  Sleep Study - patient states she had sleep study 15+ years ago, was not diagnosed with OSA at that time due to incomplete study.   Aspirin Instructions: Patient instructed to hold all Aspirin, NSAID's, herbal medications, fish oil and vitamins 7 days prior to surgery.   Anesthesia review: patient seen in PAT by Karoline Caldwell, PA-C for upper respiratory symptoms. Patient has had productive cough with intermittent wheezing since 08/08/18. She was treated by PCP with a z-pak and completed course on 08/17/18. Patient continuing to have productive cough and wheeze. Jeneen Rinks spoke with Dr. Ninfa Linden- will likely reschedule patients surgery, office to contact patient.     Patient verbalized understanding of instructions that were given to them at the PAT appointment. Patient was also instructed that they will need to review over the PAT instructions again at home before surgery.

## 2018-08-20 DIAGNOSIS — J209 Acute bronchitis, unspecified: Secondary | ICD-10-CM | POA: Diagnosis not present

## 2018-09-02 ENCOUNTER — Other Ambulatory Visit (HOSPITAL_COMMUNITY): Payer: Self-pay

## 2018-09-02 DIAGNOSIS — F3162 Bipolar disorder, current episode mixed, moderate: Secondary | ICD-10-CM

## 2018-09-02 MED ORDER — LAMOTRIGINE 150 MG PO TABS: 300.0000 mg | ORAL_TABLET | Freq: Every day | ORAL | 0 refills | Status: DC

## 2018-09-02 MED ORDER — BREXPIPRAZOLE 2 MG PO TABS: 2.0000 mg | ORAL_TABLET | Freq: Every day | ORAL | 0 refills | Status: DC

## 2018-09-02 MED ORDER — LITHIUM CARBONATE ER 450 MG PO TBCR: EXTENDED_RELEASE_TABLET | ORAL | 0 refills | Status: DC

## 2018-09-07 ENCOUNTER — Emergency Department (HOSPITAL_COMMUNITY)
Admission: EM | Admit: 2018-09-07 | Discharge: 2018-09-08 | Disposition: A | Discharge status: Other Acute Inpatient Hospital | Payer: Medicare Other | Source: Home / Self Care | Attending: Emergency Medicine | Admitting: Emergency Medicine

## 2018-09-07 ENCOUNTER — Encounter (HOSPITAL_COMMUNITY): Payer: Self-pay

## 2018-09-07 ENCOUNTER — Other Ambulatory Visit: Payer: Self-pay

## 2018-09-07 ENCOUNTER — Encounter (HOSPITAL_COMMUNITY): Payer: Self-pay | Admitting: Licensed Clinical Social Worker

## 2018-09-07 ENCOUNTER — Ambulatory Visit (HOSPITAL_COMMUNITY): Payer: BC Managed Care – PPO | Admitting: Licensed Clinical Social Worker

## 2018-09-07 DIAGNOSIS — Z79899 Other long term (current) drug therapy: Secondary | ICD-10-CM

## 2018-09-07 DIAGNOSIS — F419 Anxiety disorder, unspecified: Secondary | ICD-10-CM | POA: Insufficient documentation

## 2018-09-07 DIAGNOSIS — F3162 Bipolar disorder, current episode mixed, moderate: Secondary | ICD-10-CM

## 2018-09-07 DIAGNOSIS — D509 Iron deficiency anemia, unspecified: Secondary | ICD-10-CM | POA: Diagnosis not present

## 2018-09-07 DIAGNOSIS — F314 Bipolar disorder, current episode depressed, severe, without psychotic features: Secondary | ICD-10-CM

## 2018-09-07 DIAGNOSIS — G4700 Insomnia, unspecified: Secondary | ICD-10-CM | POA: Diagnosis not present

## 2018-09-07 DIAGNOSIS — Z9114 Patient's other noncompliance with medication regimen: Secondary | ICD-10-CM | POA: Insufficient documentation

## 2018-09-07 DIAGNOSIS — R451 Restlessness and agitation: Secondary | ICD-10-CM

## 2018-09-07 DIAGNOSIS — I10 Essential (primary) hypertension: Secondary | ICD-10-CM

## 2018-09-07 DIAGNOSIS — R45851 Suicidal ideations: Secondary | ICD-10-CM

## 2018-09-07 DIAGNOSIS — Z046 Encounter for general psychiatric examination, requested by authority: Secondary | ICD-10-CM

## 2018-09-07 DIAGNOSIS — Z23 Encounter for immunization: Secondary | ICD-10-CM | POA: Diagnosis not present

## 2018-09-07 LAB — COMPREHENSIVE METABOLIC PANEL
ALT: 15 U/L (ref 0–44)
AST: 14 U/L — ABNORMAL LOW (ref 15–41)
Albumin: 4.4 g/dL (ref 3.5–5.0)
Alkaline Phosphatase: 57 U/L (ref 38–126)
Anion gap: 9 (ref 5–15)
BUN: 10 mg/dL (ref 6–20)
CO2: 19 mmol/L — ABNORMAL LOW (ref 22–32)
Calcium: 9.9 mg/dL (ref 8.9–10.3)
Chloride: 109 mmol/L (ref 98–111)
Creatinine, Ser: 0.97 mg/dL (ref 0.44–1.00)
GFR calc Af Amer: >60 mL/min (ref >60)
GFR calc non Af Amer: >60 mL/min (ref >60)
Glucose, Bld: 94 mg/dL (ref 70–99)
Potassium: 3.5 mmol/L (ref 3.5–5.1)
Sodium: 137 mmol/L (ref 135–145)
Total Bilirubin: 0.1 mg/dL — ABNORMAL LOW (ref 0.3–1.2)
Total Protein: 8.3 g/dL — ABNORMAL HIGH (ref 6.5–8.1)

## 2018-09-07 LAB — RAPID URINE DRUG SCREEN, HOSP PERFORMED
Amphetamines: NOT DETECTED
Barbiturates: NOT DETECTED
Benzodiazepines: NOT DETECTED
Cocaine: NOT DETECTED
Opiates: NOT DETECTED
Tetrahydrocannabinol: NOT DETECTED

## 2018-09-07 LAB — CBC
HCT: 36.5 % (ref 36.0–46.0)
Hemoglobin: 10.1 g/dL — ABNORMAL LOW (ref 12.0–15.0)
MCH: 23.1 pg — ABNORMAL LOW (ref 26.0–34.0)
MCHC: 27.7 g/dL — ABNORMAL LOW (ref 30.0–36.0)
MCV: 83.5 fL (ref 80.0–100.0)
Platelets: 404 10*3/uL — ABNORMAL HIGH (ref 150–400)
RBC: 4.37 MIL/uL (ref 3.87–5.11)
RDW: 17.4 % — ABNORMAL HIGH (ref 11.5–15.5)
WBC: 12 10*3/uL — ABNORMAL HIGH (ref 4.0–10.5)
nRBC: 0.2 % (ref 0.0–0.2)

## 2018-09-07 LAB — ETHANOL: Alcohol, Ethyl (B): <10 mg/dL (ref <10)

## 2018-09-07 LAB — ACETAMINOPHEN LEVEL: Acetaminophen (Tylenol), Serum: <10 ug/mL — ABNORMAL LOW (ref 10–30)

## 2018-09-07 LAB — SALICYLATE LEVEL: Salicylate Lvl: <7 mg/dL (ref 2.8–30.0)

## 2018-09-07 MED ORDER — LAMOTRIGINE 100 MG PO TABS
300.0000 mg | ORAL_TABLET | Freq: Every day | ORAL | Status: DC
  Administered 2018-09-07: 300 mg via ORAL
  Filled 2018-09-07: qty 3

## 2018-09-07 MED ORDER — CLONAZEPAM 0.5 MG PO TABS
0.5000 mg | ORAL_TABLET | Freq: Two times a day (BID) | ORAL | Status: DC | PRN
  Administered 2018-09-07: 0.5 mg via ORAL
  Filled 2018-09-07: qty 1

## 2018-09-07 MED ORDER — METOPROLOL SUCCINATE ER 50 MG PO TB24: 50.0000 mg | ORAL_TABLET | Freq: Every day | ORAL | Status: DC

## 2018-09-07 MED ORDER — ONDANSETRON HCL 4 MG PO TABS: 4.0000 mg | ORAL_TABLET | Freq: Three times a day (TID) | ORAL | Status: DC | PRN

## 2018-09-07 MED ORDER — NICOTINE 21 MG/24HR TD PT24: 21.0000 mg | MEDICATED_PATCH | Freq: Every day | TRANSDERMAL | Status: DC

## 2018-09-07 MED ORDER — LITHIUM CARBONATE ER 450 MG PO TBCR
450.0000 mg | EXTENDED_RELEASE_TABLET | Freq: Every evening | ORAL | Status: DC
  Administered 2018-09-07: 450 mg via ORAL
  Filled 2018-09-07: qty 1

## 2018-09-07 MED ORDER — BREXPIPRAZOLE 2 MG PO TABS
2.0000 mg | ORAL_TABLET | Freq: Every day | ORAL | Status: DC
  Administered 2018-09-07: 2 mg via ORAL
  Filled 2018-09-07 (×2): qty 1

## 2018-09-07 MED ORDER — TOPIRAMATE 25 MG PO TABS
50.0000 mg | ORAL_TABLET | Freq: Every day | ORAL | Status: DC
  Filled 2018-09-07: qty 2

## 2018-09-07 MED ORDER — ACETAMINOPHEN 325 MG PO TABS: 650.0000 mg | ORAL_TABLET | ORAL | Status: DC | PRN

## 2018-09-07 MED ORDER — ALUM & MAG HYDROXIDE-SIMETH 200-200-20 MG/5ML PO SUSP: 30.0000 mL | Freq: Four times a day (QID) | ORAL | Status: DC | PRN

## 2018-09-07 MED ORDER — ZOLPIDEM TARTRATE 5 MG PO TABS: 5.0000 mg | ORAL_TABLET | Freq: Every evening | ORAL | Status: DC | PRN

## 2018-09-07 MED ORDER — ALBUTEROL SULFATE HFA 108 (90 BASE) MCG/ACT IN AERS: 1.0000 | INHALATION_SPRAY | Freq: Four times a day (QID) | RESPIRATORY_TRACT | Status: DC | PRN

## 2018-09-07 MED ORDER — AMLODIPINE BESYLATE 5 MG PO TABS
10.0000 mg | ORAL_TABLET | Freq: Every day | ORAL | Status: DC
  Filled 2018-09-07: qty 2

## 2018-09-07 NOTE — ED Provider Notes (Signed)
[facility] DEPT Provider Note   CSN: [id number] Arrival date & time: 09/07/18  1625     History   Chief Complaint Chief Complaint  Patient presents with  . Suicidal    HPI Christine [name] is a 43 y.o. female with a hx of anemia, anxiety, bipolar 1 disorder, PCOS, and HTN who presents to the ED with complaints of suicidal ideation over past few days. Patient states she has a hx of bipolar disorder and has not been consistent in taking her medicines. She has had thoughts of self harm over the past few days with plans including cutting herself, overdosing on pills, and many more. She was being seen by her therapist today and it was recommended that she come to the ER. Denies HI or hallucinations. Denies fever, URI sxs, chest pain, dyspnea, or abdominal pain.   HPI  Past Medical History:  Diagnosis Date  . Anemia   . Anxiety   . Arthritis    Hands, ankles  . Bipolar 1 disorder ([place])   . Depression   . Fibroids   . Headache   . High blood pressure   . History of blood transfusion 08/2017   [facility]  . History of hiatal hernia   . Mental disorder   . PCOS (polycystic ovarian syndrome)   . Seasonal allergies     Patient Active Problem List   Diagnosis Date Noted  . Endometrium, polyp 01/15/2018    Class: Present on Admission  . Fibroids, submucosal 01/15/2018    Class: Present on Admission  . Iron deficiency anemia due to chronic blood loss 09/16/2017  . Menorrhagia with regular cycle   . Symptomatic anemia 08/28/2017  . Acute blood loss anemia 08/28/2017  . Bipolar disorder ([place]) 08/28/2017  . Anxiety 08/28/2017  . Depression 08/28/2017  . Fibroids 08/28/2017  . PCOS (polycystic ovarian syndrome) 08/28/2017  . Fever 08/28/2017  . HTN (hypertension) 08/28/2017  . Morbid obesity ([place]) 12/04/2015  . Pregnancy with history of uterine myomectomy 04/07/2015  . History of right oophorectomy 02/21/2015  . History of recurrent miscarriages  02/01/2015  . Vitamin D deficiency 02/01/2015    Past Surgical History:  Procedure Laterality Date  . CERVICAL POLYPECTOMY  01/15/2018   Procedure: CERVICAL POLYPECTOMY;  Surgeon: Arvella Nigh, MD;  Location: [facility];  Service: Gynecology;;  . COLONOSCOPY  2008   Normal  . DIAGNOSTIC LAPAROSCOPY  08/1999   dermoid cyst, RSO  . DILATION AND CURETTAGE OF UTERUS  05/2004   MAB  . DILATION AND CURETTAGE OF UTERUS  04/2015  . HYSTEROSCOPY W/D&C  07/22/2011   Procedure: DILATATION AND CURETTAGE /HYSTEROSCOPY;  Surgeon: Cyril Mourning, MD;  Location: [facility];  Service: Gynecology;;  . HYSTEROSCOPY W/D&C N/A 01/15/2018   Procedure: DILATATION AND CURETTAGE [place] WITH MYOSURE;  Surgeon: Arvella Nigh, MD;  Location: [facility];  Service: Gynecology;  Laterality: N/A;     OB History   No obstetric history on file.      Home Medications    Prior to Admission medications   Medication Sig Start Date End Date Taking? Authorizing Provider  Albuterol Sulfate (PROAIR HFA IN) Inhale into the lungs every 6 (six) hours as needed. 1 puff every 6 hours PRN    [provider]  amLODipine (NORVASC) 10 MG tablet Take 1 tablet (10 mg total) by mouth daily. 08/29/17   Eugenie Filler, MD  Brexpiprazole ([name]) 2 MG TABS Take 2 mg by mouth daily. 09/02/18   [name],  [name], MD  clonazePAM (KLONOPIN) 0.5 MG tablet Take 1 tablet (0.5 mg total) by mouth 2 (two) times daily as needed for anxiety. 06/22/18   [name], [name], MD  lamoTRIgine (LAMICTAL) 150 MG tablet Take 2 tablets (300 mg total) by mouth daily. 09/02/18   [name], [name], MD  lithium carbonate (ESKALITH) 450 MG CR tablet Take two tab in AM and Two tab in PM 09/02/18   [name], [name], MD  metoprolol succinate (TOPROL-XL) 50 MG 24 hr tablet Take 50 mg by mouth daily. Take with or immediately following a meal.    [provider]  oxyCODONE (ROXICODONE) 5 MG immediate release tablet Take 1 tablet (5 mg total) by mouth every 6 (six)  hours as needed for severe pain. 08/08/18   Fawze, Mina A, PA-C  topiramate (TOPAMAX) 50 MG tablet Take 50 mg by mouth daily.    [provider]    Family History Family History  Problem Relation Age of Onset  . Anxiety disorder Mother   . Depression Mother   . Cancer Father        colon  . Cancer Brother 6       leukemia, childhood    Social History Social History   Tobacco Use  . Smoking status: Never Smoker  . Smokeless tobacco: Never Used  Substance Use Topics  . Alcohol use: Yes    Comment: occasional  . Drug use: No     Allergies   Penicillins   Review of Systems Review of Systems  Constitutional: Negative for chills and fever.  Respiratory: Negative for shortness of breath.   Cardiovascular: Negative for chest pain.  Gastrointestinal: Negative for abdominal pain, diarrhea and vomiting.  Genitourinary: Negative for dysuria.  Psychiatric/Behavioral: Positive for suicidal ideas.  All other systems reviewed and are negative.    Physical Exam Updated Vital Signs BP (!) 142/91 (BP Location: Left Arm)   Pulse (!) 104   Temp 99.6 F (37.6 C) (Oral)   Resp 18   Ht 5\' 5"  (1.651 m)   Wt (!) 153.3 kg   LMP 08/24/2018 (Approximate)   SpO2 98%   BMI 56.25 kg/m   Physical Exam Vitals signs and nursing note reviewed.  Constitutional:      General: She is not in acute distress.    Appearance: She is well-developed. She is not toxic-appearing.  HENT:     Head: Normocephalic and atraumatic.  Eyes:     General:        Right eye: No discharge.        Left eye: No discharge.     Conjunctiva/sclera: Conjunctivae normal.  Neck:     Musculoskeletal: Neck supple.  Cardiovascular:     Rate and Rhythm: Normal rate and regular rhythm.  Pulmonary:     Effort: Pulmonary effort is normal. No respiratory distress.     Breath sounds: Normal breath sounds. No wheezing, rhonchi or rales.  Abdominal:     General: There is no distension.     Palpations: Abdomen  is soft.     Tenderness: There is no abdominal tenderness.  Skin:    General: Skin is warm and dry.     Findings: No rash.  Neurological:     Mental Status: She is alert.     Comments: Clear speech.   Psychiatric:        Behavior: Behavior normal.        Thought Content: Thought content includes suicidal ideation. Thought content  does not include homicidal ideation. Thought content includes suicidal plan. Thought content does not include homicidal plan.      ED Treatments / Results  Labs (all labs ordered are listed, but only abnormal results are displayed) Labs Reviewed  COMPREHENSIVE METABOLIC PANEL - Abnormal; Notable for the following components:      Result Value   CO2 19 (*)    Total Protein 8.3 (*)    AST 14 (*)    Total Bilirubin 0.1 (*)    All other components within normal limits  ACETAMINOPHEN LEVEL - Abnormal; Notable for the following components:   Acetaminophen (Tylenol), Serum <10 (*)    All other components within normal limits  CBC - Abnormal; Notable for the following components:   WBC 12.0 (*)    Hemoglobin 10.1 (*)    MCH 23.1 (*)    MCHC 27.7 (*)    RDW 17.4 (*)    Platelets 404 (*)    All other components within normal limits  ETHANOL  SALICYLATE LEVEL  RAPID URINE DRUG SCREEN, HOSP PERFORMED  I-STAT BETA HCG BLOOD, ED ([facility], [facility], [facility] ONLY)    EKG None  Radiology No results found.  Procedures Procedures (including critical care time)  Medications Ordered in ED Medications - No data to display   Initial Impression / Assessment and Plan / ED Course  I have reviewed the triage vital signs and the nursing notes.  Pertinent labs & imaging results that were available during my care of the patient were reviewed by me and considered in my medical decision making (see chart for details).   Patient presents from her therapist office for SI w/ multiple plans. Initial tachycardia/htn normalized w/ repeat vitals.   Screening labs reviewed:  CBC:  Nonspecific leukocytosis at 12.0. Anemia consistent with prior on record.  CMP: Bicarb mildly low at 19. Renal function preserved. Electrolytes WNL Tox labs, UDS, & ethanol are negative  Patient medically cleared for TTS evaluation. Disposition per [facility].   Patient meets inpatient criteria, pending placement.   Vitals:   09/07/18 1646 09/07/18 2122  BP: (!) 142/91 140/86  Pulse: (!) 104 93  Resp: 18 16  Temp: 99.6 F (37.6 C) 99.1 F (37.3 C)  SpO2: 98% 96%     Final Clinical Impressions(s) / ED Diagnoses   Final diagnoses:  Suicidal ideation    ED Discharge Orders    None       Leafy Kindle 09/07/18 2127    Davonna Belling, MD 09/07/18 [phone]

## 2018-09-07 NOTE — Progress Notes (Signed)
Pt presents for her individual appointment since 11/19. She presents both manic and depressed. Pt was crying uncontrollable. She showed me on her phone where she had in detail written about how she would commit suicide. I spoke with her psychiatrist Dr Adele Schilder who suggested she go to [facility] ED for clearance to [facility]. I spoke to pt's husband by phone on speaker to explain, pt then spoke with her husband and told him she thought it best for her to go into [facility]. Pt called me from the ED saying she had arrived and checked in at [facility].  Christine [name]

## 2018-09-07 NOTE — ED Notes (Signed)
Asked what had been decided about her being inpatient of discharge. She was told by writer she was recommended for inpatient. States she thinks that is the right thing for her but she is unsure she wants to do it, states she is ashamed of being inpatient but at the same time she says this is the worst she has been where she has gotten to the point of writing down ways she can kill herself.

## 2018-09-07 NOTE — ED Notes (Signed)
Bed: [facility]PT3 Expected date:  Expected time:  Means of arrival:  Comments: 
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
## 2018-09-07 NOTE — ED Notes (Signed)
Alert and cooperative. She is here because she is having SI thoughts and wrote out multiple plans and showed them to her therapist earlier today. She has been hospitalized three times prior to today. Last time she was hospitalized was about three years ago. She has never had any actual suicide attempts, history of multiple thoughts and plans. She has a support system. She is moving her foot rapidly while sitting in bed and states she has been feeling agitated and anxious and takes Klonopin for her anxiety. She was given a prn Klonopin for her complaints along with medicines scheduled. She refused the Norvasc and Topamax because she said she has already had them both today and doesn't take them a second time in the HS. She is currently watching the [place] election and is calm.

## 2018-09-07 NOTE — ED Triage Notes (Addendum)
Patient states she has been having suicidal thoughts for the past few days.patient states she was at her therapist today and was told to come to the ED. Patient denies any HI, visual or auditory hallucinations. Patient denies any drug or alcohol use.  Patient states she has written out several plans to hurt herself.

## 2018-09-07 NOTE — Progress Notes (Signed)
Nurse, Vinnie Level, Dr. Alvino Chapel and [place], [place] informed of pt disposition.

## 2018-09-08 ENCOUNTER — Inpatient Hospital Stay (HOSPITAL_COMMUNITY)
Admission: AD | Admit: 2018-09-08 | Discharge: 2018-09-12 | DRG: 885 | Disposition: A | Discharge status: Home / Self Care | Payer: Medicare Other | Source: Intra-hospital | Attending: Psychiatry | Admitting: Psychiatry

## 2018-09-08 ENCOUNTER — Encounter (HOSPITAL_COMMUNITY): Payer: Self-pay

## 2018-09-08 DIAGNOSIS — Z915 Personal history of self-harm: Secondary | ICD-10-CM | POA: Diagnosis not present

## 2018-09-08 DIAGNOSIS — E282 Polycystic ovarian syndrome: Secondary | ICD-10-CM | POA: Diagnosis present

## 2018-09-08 DIAGNOSIS — Z818 Family history of other mental and behavioral disorders: Secondary | ICD-10-CM

## 2018-09-08 DIAGNOSIS — J302 Other seasonal allergic rhinitis: Secondary | ICD-10-CM | POA: Diagnosis present

## 2018-09-08 DIAGNOSIS — F314 Bipolar disorder, current episode depressed, severe, without psychotic features: Secondary | ICD-10-CM | POA: Diagnosis not present

## 2018-09-08 DIAGNOSIS — Z79899 Other long term (current) drug therapy: Secondary | ICD-10-CM | POA: Diagnosis not present

## 2018-09-08 DIAGNOSIS — G4700 Insomnia, unspecified: Secondary | ICD-10-CM | POA: Diagnosis present

## 2018-09-08 DIAGNOSIS — Z23 Encounter for immunization: Secondary | ICD-10-CM | POA: Diagnosis not present

## 2018-09-08 DIAGNOSIS — I10 Essential (primary) hypertension: Secondary | ICD-10-CM | POA: Diagnosis present

## 2018-09-08 DIAGNOSIS — K449 Diaphragmatic hernia without obstruction or gangrene: Secondary | ICD-10-CM | POA: Diagnosis present

## 2018-09-08 DIAGNOSIS — R45851 Suicidal ideations: Secondary | ICD-10-CM | POA: Diagnosis present

## 2018-09-08 DIAGNOSIS — D509 Iron deficiency anemia, unspecified: Secondary | ICD-10-CM | POA: Diagnosis present

## 2018-09-08 DIAGNOSIS — Z880 Allergy status to penicillin: Secondary | ICD-10-CM

## 2018-09-08 DIAGNOSIS — F419 Anxiety disorder, unspecified: Secondary | ICD-10-CM | POA: Diagnosis present

## 2018-09-08 LAB — LITHIUM LEVEL: Lithium Lvl: 0.85 mmol/L (ref 0.60–1.20)

## 2018-09-08 MED ORDER — ALUM & MAG HYDROXIDE-SIMETH 200-200-20 MG/5ML PO SUSP: 30.0000 mL | ORAL | Status: DC | PRN

## 2018-09-08 MED ORDER — LITHIUM CARBONATE 300 MG PO CAPS
600.0000 mg | ORAL_CAPSULE | Freq: Two times a day (BID) | ORAL | Status: DC
  Administered 2018-09-08 – 2018-09-12 (×8): 600 mg via ORAL
  Filled 2018-09-08 (×12): qty 2

## 2018-09-08 MED ORDER — HYDROXYZINE HCL 25 MG PO TABS
25.0000 mg | ORAL_TABLET | Freq: Three times a day (TID) | ORAL | Status: DC | PRN
  Administered 2018-09-09 – 2018-09-11 (×3): 25 mg via ORAL
  Filled 2018-09-08 (×3): qty 1

## 2018-09-08 MED ORDER — LAMOTRIGINE 150 MG PO TABS
150.0000 mg | ORAL_TABLET | Freq: Two times a day (BID) | ORAL | Status: DC
  Administered 2018-09-08 – 2018-09-12 (×9): 150 mg via ORAL
  Filled 2018-09-08 (×13): qty 1

## 2018-09-08 MED ORDER — INFLUENZA VAC SPLIT QUAD 0.5 ML IM SUSY
0.5000 mL | PREFILLED_SYRINGE | INTRAMUSCULAR | Status: AC
  Administered 2018-09-09: 0.5 mL via INTRAMUSCULAR
  Filled 2018-09-08: qty 0.5

## 2018-09-08 MED ORDER — ARIPIPRAZOLE 5 MG PO TABS
5.0000 mg | ORAL_TABLET | Freq: Every day | ORAL | Status: DC
  Administered 2018-09-08 – 2018-09-09 (×2): 5 mg via ORAL
  Filled 2018-09-08 (×4): qty 1

## 2018-09-08 MED ORDER — ACETAMINOPHEN 325 MG PO TABS: 650.0000 mg | ORAL_TABLET | Freq: Four times a day (QID) | ORAL | Status: DC | PRN

## 2018-09-08 MED ORDER — AMLODIPINE BESYLATE 10 MG PO TABS
10.0000 mg | ORAL_TABLET | Freq: Every day | ORAL | Status: DC
  Administered 2018-09-08 – 2018-09-12 (×5): 10 mg via ORAL
  Filled 2018-09-08 (×7): qty 1

## 2018-09-08 MED ORDER — METOPROLOL SUCCINATE ER 50 MG PO TB24
50.0000 mg | ORAL_TABLET | Freq: Every day | ORAL | Status: DC
  Administered 2018-09-08 – 2018-09-12 (×5): 50 mg via ORAL
  Filled 2018-09-08 (×7): qty 1

## 2018-09-08 MED ORDER — MAGNESIUM HYDROXIDE 400 MG/5ML PO SUSP: 30.0000 mL | Freq: Every day | ORAL | Status: DC | PRN

## 2018-09-08 MED ORDER — ALBUTEROL SULFATE HFA 108 (90 BASE) MCG/ACT IN AERS: 2.0000 | INHALATION_SPRAY | RESPIRATORY_TRACT | Status: DC | PRN
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
## 2018-09-08 NOTE — ED Notes (Signed)
Accepted at [facility] room 404 1 per Dr Mallie Darting. Report called to [employer]. [facility] transported. She notified her husband and is accepting of the transfer. All property returned to her.

## 2018-09-08 NOTE — Progress Notes (Signed)
Patient stated that the highlight of her day was her conversation with her husband. Her goal for tomorrow is to participate more with the unit activities.

## 2018-09-08 NOTE — BH Assessment (Signed)
[facility] Assessment Progress Note    Pt accepted to [facility] 401-2 to Dr. Mallie Darting. Pt can come after 11:00 today

## 2018-09-08 NOTE — Progress Notes (Signed)
Patient ID: Christine [name], female   DOB: 1975/10/21, 43 y.o.   MRN: [id number] Per State regulations 482.30 this chart was reviewed for medical necessity with respect to the patient's admission/duration of stay.    Next review date: 09/12/2018  Debarah Crape, BSN, RN-BC  Case Manager

## 2018-09-08 NOTE — H&P (Addendum)
Psychiatric Admission Assessment Adult  Patient Identification: Christine [name] MRN:  [id number] Date of Evaluation:  09/08/2018 Chief Complaint:  Bipolar 1 disorder Principal Diagnosis: Bipolar 1 disorder, depressed, severe ([place]) Diagnosis:  Principal Problem:   Bipolar 1 disorder, depressed, severe ([place])  History of Present Illness: Christine [name] is a 43 year old female with history of hypertension, migraines, iron deficiency anemia, and bipolar disorder, presenting for treatment of suicidal ideation. Patient reports, "I've been going through a lot of stress lately." She was sick with bronchitis during January and was having problems with insurance coverage while sick. It took several weeks to recover from bronchitis, and she reports "I may have been inconsistent with some of my meds" for mental health in the meantime. She reports increased levels of depression over the last week, with decreased sleep (3-4 hours/night), decreased appetite, fatigue, anhedonia, and suicidal ideation. She made a list of methods for suicide, including overdose, cutting self, and crashing her car, and listed out pros/cons for each method. She met with her therapist yesterday and showed her this list, then came to [facility] voluntarily per therapist's recommendation. She continues to have passive SI but denies suicidal intent or plan on the unit and contracts for safety. Denies HI. Denies AVH. Denies hypomanic symptoms currently, other than irritability. She is currently taking lithium 900 mg BID (reports lithium levels have been low while compliant at this dose), Lamictal 150 mg BID, Topamax 50 mg daily, and Klonopin 0.5 mg PRN (reports taking 2-3 times per week). She was taking [name] also but stopped in December due to problems with insurance coverage.  Associated Signs/Symptoms: Depression Symptoms:  depressed mood, anhedonia, insomnia, fatigue, anxiety, (Hypo) Manic Symptoms:  Irritable Mood, Anxiety Symptoms:  Excessive  Worry, Psychotic Symptoms:  denies PTSD Symptoms: Negative Total Time spent with patient: 45 minutes  Past Psychiatric History: Long history of bipolar disorder, receiving disability since 2009. Reports prior manic, hypomanic, and mixed episodes. Denies hx AVH. 3 prior hospitalizations, with most recent in November 2017 for SI with mixed symptoms. Hx of SIB with last episode of cutting summer 2019. Denies history of suicide attempts. Currently in outpatient therapy and psychiatry at [facility].  Is the patient at risk to self? Yes.    Has the patient been a risk to self in the past 6 months? Yes.    Has the patient been a risk to self within the distant past? Yes.    Is the patient a risk to others? No.  Has the patient been a risk to others in the past 6 months? No.  Has the patient been a risk to others within the distant past? No.   Prior Inpatient Therapy:   Prior Outpatient Therapy:    Alcohol Screening: 1. How often do you have a drink containing alcohol?: Monthly or less 2. How many drinks containing alcohol do you have on a typical day when you are drinking?: 1 or 2 3. How often do you have six or more drinks on one occasion?: Never AUDIT-C Score: 1 9. Have you or someone else been injured as a result of your drinking?: No 10. Has a relative or friend or a doctor or another health worker been concerned about your drinking or suggested you cut down?: No Alcohol Use Disorder Identification Test Final Score (AUDIT): 1 Alcohol Brief Interventions/Follow-up: AUDIT Score <7 follow-up not indicated Substance Abuse History in the last 12 months:  No. Consequences of Substance Abuse: NA Previous Psychotropic Medications: Yes  Psychological Evaluations: No  Past Medical  History:  Past Medical History:  Diagnosis Date  . Anemia   . Anxiety   . Arthritis    Hands, ankles  . Bipolar 1 disorder ([place])   . Depression   . Fibroids   . Headache   . High blood pressure   . History of blood  transfusion 08/2017   [facility]  . History of hiatal hernia   . Mental disorder   . PCOS (polycystic ovarian syndrome)   . Seasonal allergies     Past Surgical History:  Procedure Laterality Date  . CERVICAL POLYPECTOMY  01/15/2018   Procedure: CERVICAL POLYPECTOMY;  Surgeon: Arvella Nigh, MD;  Location: [facility];  Service: Gynecology;;  . COLONOSCOPY  2008   Normal  . DIAGNOSTIC LAPAROSCOPY  08/1999   dermoid cyst, [facility]  . DILATION AND CURETTAGE OF UTERUS  05/2004   [facility]  . DILATION AND CURETTAGE OF UTERUS  04/2015  . HYSTEROSCOPY W/D&C  07/22/2011   Procedure: DILATATION AND CURETTAGE /HYSTEROSCOPY;  Surgeon: Cyril Mourning, MD;  Location: [facility];  Service: Gynecology;;  . HYSTEROSCOPY W/D&C N/A 01/15/2018   Procedure: DILATATION AND CURETTAGE [place] WITH MYOSURE;  Surgeon: Arvella Nigh, MD;  Location: [facility];  Service: Gynecology;  Laterality: N/A;   Family History:  Family History  Problem Relation Age of Onset  . Anxiety disorder Mother   . Depression Mother   . Cancer Father        colon  . Cancer Brother 6       leukemia, childhood   Family Psychiatric  History: Mother has depression and anxiety. Tobacco Screening: Have you used any form of tobacco in the last 30 days? (Cigarettes, Smokeless Tobacco, Cigars, and/or Pipes): No Social History:  Social History   Substance and Sexual Activity  Alcohol Use Yes   Comment: occasional     Social History   Substance and Sexual Activity  Drug Use No    Additional Social History:                           Allergies:   Allergies  Allergen Reactions  . Penicillins Rash and Other (See Comments)    Has patient had a PCN reaction causing immediate rash, facial/tongue/throat swelling, SOB or lightheadedness with hypotension: Yes Has patient had a PCN reaction causing severe rash involving mucus membranes or skin necrosis: Unknown Has patient had a PCN reaction that required hospitalization: No Has patient had a  PCN reaction occurring within the last 10 years: No If all of the above answers are "NO", then may proceed with Cephalosporin use.    Lab Results:  Results for orders placed or performed during the hospital encounter of 09/07/18 (from the past 48 hour(s))  Rapid urine drug screen (hospital performed)     Status: None   Collection Time: 09/07/18  5:10 PM  Result Value Ref Range   Opiates NONE DETECTED NONE DETECTED   Cocaine NONE DETECTED NONE DETECTED   Benzodiazepines NONE DETECTED NONE DETECTED   Amphetamines NONE DETECTED NONE DETECTED   Tetrahydrocannabinol NONE DETECTED NONE DETECTED   Barbiturates NONE DETECTED NONE DETECTED    Comment: (NOTE) DRUG SCREEN FOR MEDICAL PURPOSES ONLY.  IF CONFIRMATION IS NEEDED FOR ANY PURPOSE, NOTIFY LAB WITHIN 5 DAYS. LOWEST DETECTABLE LIMITS FOR URINE DRUG SCREEN Drug Class                     Cutoff (ng/mL) Amphetamine and metabolites  1000 Barbiturate and metabolites    200 Benzodiazepine                 829 Tricyclics and metabolites     300 Opiates and metabolites        300 Cocaine and metabolites        300 THC                            50 Performed at [facility], [place] [address], [place], [place] [address]   Comprehensive metabolic panel     Status: Abnormal   Collection Time: 09/07/18  5:18 PM  Result Value Ref Range   Sodium 137 135 - 145 mmol/L   Potassium 3.5 3.5 - 5.1 mmol/L   Chloride 109 98 - 111 mmol/L   CO2 19 (L) 22 - 32 mmol/L   Glucose, Bld 94 70 - 99 mg/dL   BUN 10 6 - 20 mg/dL   Creatinine, Ser 0.97 0.44 - 1.00 mg/dL   Calcium 9.9 8.9 - 10.3 mg/dL   Total Protein 8.3 (H) 6.5 - 8.1 g/dL   Albumin 4.4 3.5 - 5.0 g/dL   AST 14 (L) 15 - 41 U/L   ALT 15 0 - 44 U/L   Alkaline Phosphatase 57 38 - 126 U/L   Total Bilirubin 0.1 (L) 0.3 - 1.2 mg/dL   GFR calc non Af Amer >60 >60 mL/min   GFR calc Af Amer >60 >60 mL/min   Anion gap 9 5 - 15    Comment: Performed at [facility], [place] [address], [place], [place] [address]  Ethanol     Status: None   Collection Time: 09/07/18  5:18 PM  Result Value Ref Range   Alcohol, Ethyl (B) <10 <10 mg/dL    Comment: (NOTE) Lowest detectable limit for serum alcohol is 10 mg/dL. For medical purposes only. Performed at [facility], [place] [address] [place], [place] [address]   Salicylate level     Status: None   Collection Time: 09/07/18  5:18 PM  Result Value Ref Range   Salicylate Lvl <7.5 2.8 - 30.0 mg/dL    Comment: Performed at [facility], [place] [address], [place], [place] [address]  Acetaminophen level     Status: Abnormal   Collection Time: 09/07/18  5:18 PM  Result Value Ref Range   Acetaminophen (Tylenol), Serum <10 (L) 10 - 30 ug/mL    Comment: (NOTE) Therapeutic concentrations vary significantly. A range of 10-30 ug/mL  may be an effective concentration for many patients. However, some  are best treated at concentrations outside of this range. Acetaminophen concentrations >150 ug/mL at 4 hours after ingestion  and >50 ug/mL at 12 hours after ingestion are often associated with  toxic reactions. Performed at [facility], [place] [address], [place], [place] [address]   cbc     Status: Abnormal   Collection Time: 09/07/18  5:18 PM  Result Value Ref Range   WBC 12.0 (H) 4.0 - 10.5 K/uL   RBC 4.37 3.87 - 5.11 MIL/uL   Hemoglobin 10.1 (L) 12.0 - 15.0 g/dL   HCT 36.5 36.0 - 46.0 %   MCV 83.5 80.0 - 100.0 fL   MCH 23.1 (L) 26.0 - 34.0 pg   MCHC 27.7 (L) 30.0 - 36.0 g/dL   RDW 17.4 (H) 11.5 - 15.5 %   Platelets 404 (H) 150 -  400 K/uL   nRBC 0.2 0.0 - 0.2 %    Comment: Performed at [facility], [place] [address], [place], [place] [address]    Blood Alcohol level:  Lab Results  Component Value Date   ETH <10 74/94/4967    Metabolic Disorder Labs:  No results found for: HGBA1C, MPG No results found for: PROLACTIN No  results found for: CHOL, TRIG, HDL, CHOLHDL, VLDL, LDLCALC  Current Medications: Current Facility-Administered Medications  Medication Dose Route Frequency Provider Last Rate Last Dose  . acetaminophen (TYLENOL) tablet 650 mg  650 mg Oral Q6H PRN Lindon Romp A, NP      . albuterol (PROVENTIL HFA;VENTOLIN HFA) 108 (90 Base) MCG/ACT inhaler 2 puff  2 puff Inhalation Q4H PRN Rozetta Nunnery, NP      . alum & mag hydroxide-simeth (MAALOX/MYLANTA) 200-200-20 MG/5ML suspension 30 mL  30 mL Oral Q4H PRN Lindon Romp A, NP      . amLODipine (NORVASC) tablet 10 mg  10 mg Oral Daily Lindon Romp A, NP   10 mg at 09/08/18 5916  . hydrOXYzine (ATARAX/VISTARIL) tablet 25 mg  25 mg Oral TID PRN Rozetta Nunnery, NP      . Derrill Memo ON 09/09/2018] Influenza vac split quadrivalent PF (FLUARIX) injection 0.5 mL  0.5 mL Intramuscular Tomorrow-1000 Johnn Hai, MD      . lamoTRIgine (LAMICTAL) tablet 150 mg  150 mg Oral BID Janaiyah Blackard, Myer Peer, MD      . lithium carbonate capsule 600 mg  600 mg Oral BID WC Emiliya Chretien A, MD      . magnesium hydroxide (MILK OF MAGNESIA) suspension 30 mL  30 mL Oral Daily PRN Lindon Romp A, NP      . metoprolol succinate (TOPROL-XL) 24 hr tablet 50 mg  50 mg Oral Daily Paraskevi Funez, Myer Peer, MD       PTA Medications: Medications Prior to Admission  Medication Sig Dispense Refill Last Dose  . acetaminophen (TYLENOL) 500 MG tablet Take 1,000 mg by mouth daily as needed for headache.   Past Week at Unknown time  . Albuterol Sulfate (PROAIR HFA IN) Inhale into the lungs every 6 (six) hours as needed. 1 puff every 6 hours PRN   Not Taking at Unknown time  . amLODipine (NORVASC) 10 MG tablet Take 1 tablet (10 mg total) by mouth daily. 30 tablet 0 09/07/2018 at Unknown time  . Brexpiprazole ([name]) 2 MG TABS Take 2 mg by mouth daily. (Patient not taking: Reported on 09/07/2018) 30 tablet 0 Not Taking at Unknown time  . clonazePAM (KLONOPIN) 0.5 MG tablet Take 1 tablet (0.5 mg total) by mouth 2  (two) times daily as needed for anxiety. 60 tablet 0 09/07/2018 at Unknown time  . lamoTRIgine (LAMICTAL) 150 MG tablet Take 2 tablets (300 mg total) by mouth daily. 60 tablet 0 09/07/2018 at Unknown time  . lithium carbonate (ESKALITH) 450 MG CR tablet Take two tab in AM and Two tab in PM 120 tablet 0 09/07/2018 at Unknown time  . metoprolol succinate (TOPROL-XL) 50 MG 24 hr tablet Take 50 mg by mouth daily. Take with or immediately following a meal.   09/07/2018 at 1000 am  . naproxen sodium (ALEVE) 220 MG tablet Take 440 mg by mouth daily as needed (pain).   Past Week at Unknown time  . oxyCODONE (ROXICODONE) 5 MG immediate release tablet Take 1 tablet (5 mg total) by mouth every 6 (six) hours as needed for severe pain. (  Patient not taking: Reported on 09/07/2018) 10 tablet 0 Not Taking at Unknown time  . topiramate (TOPAMAX) 50 MG tablet Take 50 mg by mouth daily.   09/07/2018 at Unknown time    Musculoskeletal: Strength & Muscle Tone: within normal limits Gait & Station: normal Patient leans: N/A  Psychiatric Specialty Exam: Physical Exam  Nursing note and vitals reviewed. Constitutional: She is oriented to person, place, and time. She appears well-developed and well-nourished.  Cardiovascular: Normal rate.  Respiratory: Effort normal.  Neurological: She is alert and oriented to person, place, and time.    Review of Systems  Constitutional: Negative.   Psychiatric/Behavioral: Positive for depression and suicidal ideas. Negative for hallucinations, memory loss and substance abuse. The patient is nervous/anxious and has insomnia.     Blood pressure 133/80, pulse 87, temperature 98.8 F (37.1 C), temperature source Oral, resp. rate 18, height _0  (1.651 m), weight (!) 153.3 kg, last menstrual period 08/24/2018.Body mass index is 56.25 kg/m.  See MD's admission SRA    Treatment Plan Summary: Daily contact with patient to assess and evaluate symptoms and progress in treatment and Medication  management   Inpatient hospitalization.  See MD's admission SRA for medication management.  Patient will participate in the therapeutic group milieu.  Discharge disposition in progress.   Observation Level/Precautions:  15 minute checks  Laboratory:  Reviewed. Check lithium level  Psychotherapy:  Group therapy  Medications:  See MAR  Consultations:  PRN  Discharge Concerns:  Safety and stabilization  Estimated LOS: 3-5 days  Other:     Physician Treatment Plan for Primary Diagnosis: Bipolar 1 disorder, depressed, severe ([place]) Long Term Goal(s): Improvement in symptoms so as ready for discharge  Short Term Goals: Ability to identify changes in lifestyle to reduce recurrence of condition will improve, Ability to verbalize feelings will improve and Ability to disclose and discuss suicidal ideas  Physician Treatment Plan for Secondary Diagnosis: Principal Problem:   Bipolar 1 disorder, depressed, severe ([place])  Long Term Goal(s): Improvement in symptoms so as ready for discharge  Short Term Goals: Ability to identify and develop effective coping behaviors will improve and Ability to identify triggers associated with substance abuse/mental health issues will improve  I certify that inpatient services furnished can reasonably be expected to improve the patient's condition.    Connye Burkitt, NP 2/4/202010:37 AM  I have discussed case with NP and have met with patient  Agree with NP note and assessment  42, married, two children. On disability.  Presented to ED voluntarily, at the suggestion of her outpatient therapist .  Reports she has been feeling more depressed over the last several days /week. She reports she has had suicidal ideations over recent days, and had been thinking of overdosing and made a list of possible suicidal methods . Endorses neuro-vegetative symptoms of depression - poor sleep, mildly decreased appetite, low energy level, anhedonia. States she has been feeling  easily agitated and vaguely irritable .  Attributes worsening depression in part to recent stressors- been feeling medically ill over recent weeks, diagnosed with bronchitis, also dealing with insurance difficulties/challenges . Psychiatric history- reports she has been diagnosed with Bipolar Disorder, and has had three prior psychiatric admissions, most recently 2017, for " mixed episode and suicidal thoughts ".  No history of suicidal attempts, history of self cutting, last time about a year ago. Denies history of psychosis. Denies any alcohol or drug abuse  Medical history - HTN, Migraines, Allergic to PCN. Medications prior to  admission- Klonopin 0.5 mgrs QDAY PRN for anxiety, states takes it about twice a week on average. Lamictal 150 mgr BID. Lithium 900 mgrs BID. Topamax 50 mgrs QDAY ( for migraine prophylaxis ) . Toprol XL 50 mgrs QDAY, Norvasc 10 mgr QDAY .  States she has been taking medications regularly .    Dx- Bipolar Disorder Depressed/ Mixed.  Plan- Inpatient admission. Continue Lithium at 600 mgrs BID Continue Lamictal 300 mgrs QDAY  Of note, reports history of good response to Abilify in the past, without side effects.  Will start Abilify 5 mgrs QDAY . Continue HTN treatment - Toprol 50 mgrs QDAY  , Norvasc 10 mgrs QDAY  Check Li level.

## 2018-09-08 NOTE — Progress Notes (Signed)
Patient presented to [facility] at the advice of her therapist after an appointment Monday. Patient had a list of the ways she could commit suicide. Si has been increasing over the past 4 days. Patient reports increased depression over the past month since being sick with bronchitis, recently getting approved for disability, changing insurances, and not being able to be consistent with her medications. Patient reports feeling she has no purpose in life and feels she is a burden to her family. Patient has had previous psych admissions at other facilities and has been diagnosed with depression, anxiety, and bipolar. Patient lives with her husband and two daughters (64,12). Patient reports her main stressors are financial and her mom dying within the past year. Patient has a medical history significant for hypertension and an abdominal hernia (has surgery scheduled to repair). Patient is allergic to penicillins. Patient oriented to the unit. Patient offered food and drink and patient declined. Patient understands her rights and responsibilities. Patient is a low fall risk. Patient skin assessment unremarkable.

## 2018-09-08 NOTE — Progress Notes (Signed)
Recreation Therapy Notes  Animal-Assisted Activity (AAA) Program Checklist/Progress Notes Patient Eligibility Criteria Checklist & Daily Group note for Rec Tx Intervention  Date: 2.4.20 Time: 1430 Location: [address]   AAA/T Program Assumption of Risk Form signed by Teacher, music or Parent Legal Guardian  YES   Patient is free of allergies or sever asthma   YES   Patient reports no fear of animals  YES   Patient reports no history of cruelty to animals  YES   Patient understands his/her participation is voluntary  YES   Patient washes hands before animal contact  YES   Patient washes hands after animal contact  YES   Behavioral Response: Engaged  Education: Contractor, Appropriate Animal Interaction   Education Outcome: Acknowledges understanding/In group clarification offered/Needs additional education.   Clinical Observations/Feedback: Pt attended and participated in group.    Victorino Sparrow, LRT/CTRS         Victorino Sparrow A 09/08/2018 3:44 PM

## 2018-09-08 NOTE — Tx Team (Signed)
Initial Treatment Plan 09/08/2018 7:45 AM Christine [name] SXQ:[id number]    PATIENT STRESSORS: Financial difficulties Health problems   PATIENT STRENGTHS: Ability for insight Average or above average intelligence Capable of independent living Communication skills Motivation for treatment/growth Supportive family/friends   PATIENT IDENTIFIED PROBLEMS: Work on: "emotion regulation" "stress management"                      DISCHARGE CRITERIA:  Medical problems require only outpatient monitoring Need for constant or close observation no longer present  PRELIMINARY DISCHARGE PLAN: Return to previous living arrangement  PATIENT/FAMILY INVOLVEMENT: This treatment plan has been presented to and reviewed with the patient, Christine [name], and/or family member.  The patient and family have been given the opportunity to ask questions and make suggestions.  Noel Christmas, RN 09/08/2018, 7:45 AM

## 2018-09-08 NOTE — Progress Notes (Signed)
Pt presents with a flat affect and a depressed mood. Pt noted to be tearful on approach and expressed not wanting to be here in the hospital. Pt expressed having to come in to the hospital  because her therapist recommended she seek tx or be IVC'd. Pt expressed having increased depression and suicidal ideations with multiple plans. Pt endorses passive SI with no plan or intent. Pt verbally contracts for safety. Pt rated on her self inventory sheet: depression 9/10, anxiety 7/10 and hopelessness 9/10. Pt compliant with taking meds and denies any side effects to medications.   Medications reviewed with pt. Verbal support provided. Pt encouraged to attend groups. 15 minute checks performed for safety.   Pt compliant with tx plan.

## 2018-09-08 NOTE — BH Assessment (Signed)
[place] Assessment Progress Note   Pt accepted to [facility] 401-2 to Dr. Mallie Darting.  Pt can come on oiver now.

## 2018-09-08 NOTE — Progress Notes (Signed)
Patient attended grief and loss group facilitated by Jerene Pitch, [place], [place], and Kerry Hough, [place], [facility], [place].   Group focuses on change and loss experienced by group members; topics include loss due to death, loss of relationships, loss of a place, loss of sense of self, etc. Group members are invited to share the changes and losses that are currently affecting their lives. Facilitators tie together shared experiences between group members and validate emotions felt by participants.  Patient Christine [name] attended and participated in group. Pt shared her experience losing her mother in October. Pt identified feelings of sadness and grief, reporting she feels as if she hasn't grieved appropriately. Pt also acknowledged that there may not be a proper way to grieve. Pt discussed her family dynamic in which everyone is making space for her father and her children to grieve, and she feels as if the space for her to grieve is not there. Other pts connected their own experiences to [facility]. Pt identified that her relationship with her mother was strained due to her bipolar diagnosis and she worked to repair this relationship when her mother became ill; she is currently grieving the loss of hope of a continued relationship with her mother.

## 2018-09-09 DIAGNOSIS — F314 Bipolar disorder, current episode depressed, severe, without psychotic features: Principal | ICD-10-CM

## 2018-09-09 MED ORDER — MELATONIN 3 MG PO TABS
3.0000 mg | ORAL_TABLET | Freq: Every evening | ORAL | Status: DC | PRN
  Administered 2018-09-09 – 2018-09-11 (×3): 3 mg via ORAL
  Filled 2018-09-09 (×4): qty 1

## 2018-09-09 MED ORDER — ARIPIPRAZOLE 10 MG PO TABS
10.0000 mg | ORAL_TABLET | Freq: Every day | ORAL | Status: DC
  Administered 2018-09-10 – 2018-09-12 (×3): 10 mg via ORAL
  Filled 2018-09-09 (×5): qty 1
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
## 2018-09-09 NOTE — Progress Notes (Signed)
Pt attended wrap-up group this evening. Pt appears flat/anxious in affect and mood. Pt denies HI/AVH/Pain at this time. Endorses SI but verbal contracts for safety. No new c/o's. Pt was guarded/cautious with interaction. C/o of insomnia; PRNs offered and accepted. Support offered. Will continue with POC.

## 2018-09-09 NOTE — Plan of Care (Signed)
Nurse discussed anxiety, depression and coping skills with patient.  

## 2018-09-09 NOTE — BHH Group Notes (Signed)
[place] Group Notes:  (Nursing/MHT/Case Management/Adjunct)  Date:  09/09/2018  Time:  3:15 pm  Type of Therapy:  Psychoeducational Skills  Participation Level:  Active  Participation Quality:  Appropriate  Affect:  Appropriate  Cognitive:  Appropriate  Insight:  Appropriate  Engagement in Group:  Engaged  Modes of Intervention:  Education  Summary of Progress/Problems: Patient alert and involved in group.    Christine [name] [place] 09/09/2018, 8:04 PM

## 2018-09-09 NOTE — Progress Notes (Signed)
Patient at med window crying, stated she does not feel she has a purpose in life any more.  Has 2 girls ages 49 and 42 yrs old that need her.  Stated she does not have any motivation for anything, has not worked in a few years, does not do anything at home, no purpose any more.  Patient does get disability since 2016.  Patient stated she loves her daughters and they need her.  Just feels she does not have a purpose for her life.   Patient does have SI thoughts, contracts for safety, no plan to hurt herself while a patient at [facility].

## 2018-09-09 NOTE — Progress Notes (Addendum)
[facility] MD Progress Note  09/09/2018 1:23 PM Christine [name]  MRN:  [id number] Subjective:  "I haven't been doing too well today."  Christine [name] participating in group therapy on the unit. Presents with depressed affect, tearful at times. She reports restless sleep last night, with fatigue today. States "I don't know what's gotten me to this point" in regards to depression. She expresses a sense of purposelessness since she does not have a job, stating inability to work impacts her self-esteem. She receives disability income. She reports lingering thoughts of worthlessness/guilt. Continues to report passive SI but denies intent or plan on the unit and contracts for safety. Morning lithium level is 0.85; level of compliance prior to admission is unclear. She reports lithium has been more helpful for mania in the past than depression. She was taking [name] until December but had to stop due to insurance no longer covering this med. States she took Abilify 15 mg/day in the past which was well tolerated. Denies medication side effects.  From admission H&P: Ms. Moor is a 43 year old female with history of hypertension, migraines, iron deficiency anemia, and bipolar disorder, presenting for treatment of suicidal ideation. Patient reports, "I've been going through a lot of stress lately." She was sick with bronchitis during January and was having problems with insurance coverage while sick. It took several weeks to recover from bronchitis, and she reports "I may have been inconsistent with some of my meds" for mental health in the meantime. She reports increased levels of depression over the last week, with decreased sleep (3-4 hours/night), decreased appetite, fatigue, anhedonia, and suicidal ideation. She made a list of methods for suicide, including overdose, cutting self, and crashing her car, and listed out pros/cons for each method. She met with her therapist yesterday and showed her this list, then came to  [facility] voluntarily per therapist's recommendation. She continues to have passive SI but denies suicidal intent or plan on the unit and contracts for safety. Denies HI. Denies AVH. Denies hypomanic symptoms currently, other than irritability.  Principal Problem: Bipolar 1 disorder, depressed, severe ([place]) Diagnosis: Principal Problem:   Bipolar 1 disorder, depressed, severe ([place])  Total Time spent with patient: 20 minutes  Past Psychiatric History: See admission H&P  Past Medical History:  Past Medical History:  Diagnosis Date  . Anemia   . Anxiety   . Arthritis    Hands, ankles  . Bipolar 1 disorder ([place])   . Depression   . Fibroids   . Headache   . High blood pressure   . History of blood transfusion 08/2017   [facility]  . History of hiatal hernia   . Mental disorder   . PCOS (polycystic ovarian syndrome)   . Seasonal allergies     Past Surgical History:  Procedure Laterality Date  . CERVICAL POLYPECTOMY  01/15/2018   Procedure: CERVICAL POLYPECTOMY;  Surgeon: Arvella Nigh, MD;  Location: [facility];  Service: Gynecology;;  . COLONOSCOPY  2008   Normal  . DIAGNOSTIC LAPAROSCOPY  08/1999   dermoid cyst, [facility]  . DILATION AND CURETTAGE OF UTERUS  05/2004   [facility]  . DILATION AND CURETTAGE OF UTERUS  04/2015  . HYSTEROSCOPY W/D&C  07/22/2011   Procedure: DILATATION AND CURETTAGE /HYSTEROSCOPY;  Surgeon: Cyril Mourning, MD;  Location: [facility];  Service: Gynecology;;  . HYSTEROSCOPY W/D&C N/A 01/15/2018   Procedure: DILATATION AND CURETTAGE [place] WITH MYOSURE;  Surgeon: Arvella Nigh, MD;  Location: [facility];  Service: Gynecology;  Laterality: N/A;  Family History:  Family History  Problem Relation Age of Onset  . Anxiety disorder Mother   . Depression Mother   . Cancer Father        colon  . Cancer Brother 6       leukemia, childhood   Family Psychiatric  History: See admission H&P Social History:  Social History   Substance and Sexual Activity  Alcohol Use Yes   Comment:  occasional     Social History   Substance and Sexual Activity  Drug Use No    Social History   Socioeconomic History  . Marital status: Married    Spouse name: Not on file  . Number of children: Not on file  . Years of education: Not on file  . Highest education level: Not on file  Occupational History  . Not on file  Social Needs  . Financial resource strain: Somewhat hard  . Food insecurity:    Worry: Sometimes true    Inability: Sometimes true  . Transportation needs:    Medical: No    Non-medical: No  Tobacco Use  . Smoking status: Never Smoker  . Smokeless tobacco: Never Used  Substance and Sexual Activity  . Alcohol use: Yes    Comment: occasional  . Drug use: No  . Sexual activity: Yes    Partners: Male    Birth control/protection: None  Lifestyle  . Physical activity:    Days per week: 0 days    Minutes per session: Not on file  . Stress: Rather much  Relationships  . Social connections:    Talks on phone: Never    Gets together: Never    Attends religious service: Never    Active member of club or organization: No    Attends meetings of clubs or organizations: Never    Relationship status: Not on file  Other Topics Concern  . Not on file  Social History Narrative  . Not on file   Additional Social History:                         Sleep: Poor  Appetite:  Fair  Current Medications: Current Facility-Administered Medications  Medication Dose Route Frequency Provider Last Rate Last Dose  . acetaminophen (TYLENOL) tablet 650 mg  650 mg Oral Q6H PRN Lindon Romp A, NP      . albuterol (PROVENTIL HFA;VENTOLIN HFA) 108 (90 Base) MCG/ACT inhaler 2 puff  2 puff Inhalation Q4H PRN Lindon Romp A, NP      . alum & mag hydroxide-simeth (MAALOX/MYLANTA) 200-200-20 MG/5ML suspension 30 mL  30 mL Oral Q4H PRN Lindon Romp A, NP      . amLODipine (NORVASC) tablet 10 mg  10 mg Oral Daily Lindon Romp A, NP   10 mg at 09/09/18 0843  . ARIPiprazole  (ABILIFY) tablet 5 mg  5 mg Oral Daily Cobos, Myer Peer, MD   5 mg at 09/09/18 5643  . hydrOXYzine (ATARAX/VISTARIL) tablet 25 mg  25 mg Oral TID PRN Rozetta Nunnery, NP      . lamoTRIgine (LAMICTAL) tablet 150 mg  150 mg Oral BID Cobos, Myer Peer, MD   150 mg at 09/09/18 0843  . lithium carbonate capsule 600 mg  600 mg Oral BID WC Cobos, Myer Peer, MD   600 mg at 09/09/18 0844  . magnesium hydroxide (MILK OF MAGNESIA) suspension 30 mL  30 mL Oral Daily PRN Rozetta Nunnery, NP      .  metoprolol succinate (TOPROL-XL) 24 hr tablet 50 mg  50 mg Oral Daily Cobos, Myer Peer, MD   50 mg at 09/09/18 0086    Lab Results:  Results for orders placed or performed during the hospital encounter of 09/08/18 (from the past 48 hour(s))  Lithium level     Status: None   Collection Time: 09/08/18  6:31 PM  Result Value Ref Range   Lithium Lvl 0.85 0.60 - 1.20 mmol/L    Comment: Performed at [facility], [place] [address], [place], [place] [address]    Blood Alcohol level:  Lab Results  Component Value Date   ETH <10 09/32/6712    Metabolic Disorder Labs: No results found for: HGBA1C, MPG No results found for: PROLACTIN No results found for: CHOL, TRIG, HDL, CHOLHDL, VLDL, LDLCALC  Physical Findings: AIMS: Facial and Oral Movements Muscles of Facial Expression: None, normal Lips and Perioral Area: None, normal Jaw: None, normal Tongue: None, normal,Extremity Movements Upper (arms, wrists, hands, fingers): None, normal Lower (legs, knees, ankles, toes): None, normal, Trunk Movements Neck, shoulders, hips: None, normal, Overall Severity Severity of abnormal movements (highest score from questions above): None, normal Incapacitation due to abnormal movements: None, normal Patient's awareness of abnormal movements (rate only patient's report): No Awareness, Dental Status Current problems with teeth and/or dentures?: No Does patient usually wear dentures?: No  CIWA:  CIWA-Ar  Total: 1 COWS:  COWS Total Score: 2  Musculoskeletal: Strength & Muscle Tone: within normal limits Gait & Station: normal Patient leans: N/A  Psychiatric Specialty Exam: Physical Exam  Nursing note and vitals reviewed. Constitutional: She is oriented to person, place, and time. She appears well-developed and well-nourished.  Cardiovascular: Normal rate.  Respiratory: Effort normal.  Neurological: She is alert and oriented to person, place, and time.    Review of Systems  Constitutional: Negative.   Psychiatric/Behavioral: Positive for depression and suicidal ideas. Negative for hallucinations, memory loss and substance abuse. The patient is nervous/anxious and has insomnia.     Blood pressure 121/67, pulse 97, temperature 98.7 F (37.1 C), temperature source Oral, resp. rate 18, height '5\' 5"'$  (1.651 m), weight (!) 153.3 kg, last menstrual period 08/24/2018.Body mass index is 56.25 kg/m.  General Appearance: Fairly Groomed  Eye Contact:  Fair  Speech:  Normal Rate  Volume:  Increased  Mood:  Depressed  Affect:  Congruent  Thought Process:  Coherent  Orientation:  Full (Time, Place, and Person)  Thought Content:  WDL  Suicidal Thoughts:  Yes.  without intent/plan  Homicidal Thoughts:  No  Memory:  Immediate;   Fair Recent;   Fair  Judgement:  Fair  Insight:  Fair  Psychomotor Activity:  Normal  Concentration:  Concentration: Fair  Recall:  [employer] of Knowledge:  Fair  Language:  Good  Akathisia:  No  Handed:  Right  AIMS (if indicated):     Assets:  Communication Skills Desire for Improvement Housing Resilience Social Support  ADL's:  Intact  Cognition:  WNL  Sleep:  Number of Hours: 4     Treatment Plan Summary: Daily contact with patient to assess and evaluate symptoms and progress in treatment and Medication management   Continue inpatient hospitalization.  Increase Abilify to 10 mg PO daily for mood Start melatonin 3 mg PO QHS PRN insomnia Continue  lithium 600 mg PO BID for mood Continue Lamictal 150 mg PO BID for mood Continue Vistaril 25 mg PO TID PRN anxiety Continue metoprolol XL 50 mg PO  daily for HTN Continue Norvasc 10 mg PO daily for HTN Continue albuterol inhaler Q4HR PRN SOB  Patient will participate in the therapeutic group milieu.  Discharge disposition in progress.   Connye Burkitt, NP 09/09/2018, 1:23 PM   ..Agree with NP Progress Note

## 2018-09-09 NOTE — Progress Notes (Signed)
DAR NOTE: Pt present with flat affect and depressed mood in the unit. Pt stated she just got here and feeling like she don't know what to expect, she is not sure if want to be here because she is not certain she can be helped because she does not know how to ask for help. She is not happy leaving her children and feel like she need to go home. Pt attended wrap up group, ate her snacks then retired to bed. Pt has been sleep with no issues. Pt's safety ensured with 15 minute and environmental checks. Pt currently denies SI/HI and A/V hallucinations. Pt verbally agrees to seek staff if SI/HI or A/VH occurs and to consult with staff before acting on these thoughts. Will continue POC.

## 2018-09-09 NOTE — Progress Notes (Signed)
Patient states that she did not have a very good day since she experienced some harmful thoughts. More importantly, she emphasized that she does not feel as if she is getting better. The patient would like to address the reason(s) for her admission. Patient had a good visit with her husband.

## 2018-09-09 NOTE — Therapy (Signed)
Occupational Therapy Group Note  Date:  09/09/2018 Time:  11:31 AM  Group Topic/Focus:  Self Esteem Action Plan:   The focus of this group is to help patients create a plan to continue to build self-esteem after discharge.  Participation Level:  Active  Participation Quality:  Appropriate  Affect:  Depressed and Flat  Cognitive:  Appropriate  Insight: Improving  Engagement in Group:  Engaged  Modes of Intervention:  Activity, Discussion, Education and Socialization  Additional Comments:     S: "I am not sure if I can fill all of the words out but I will try"  O: OT tx with focus on self esteem building this date. Education given on definition of self esteem, with both causes of low and high self esteem identified. Activity given for pt to identify a positive/aspiring trait for each letter of the alphabet. Pt to work with peers to help complete activity and build positive thinking.   A: Pt presents to group with flat/depressed affect. Pt engaged and participatory throughout session. A-Z activity completed ~75% with pt stating she does not feel she can finish all of them. Pt willing to share findings with other members. Slight improvement in affect noted.  P: Education given on self esteem and how to improve this date. Handouts and activities given to help facilitate skills when reintegrating into community.   Zenovia Jarred, MSOT, OTR/L Behavioral Health OT/ Acute Relief OT PHP Office: [place] 09/09/2018, 11:31 AM

## 2018-09-09 NOTE — Progress Notes (Signed)
D:  Patient's self inventory sheet, patient has poor sleep, no sleep medication.  Fair appetite, low energy level, poor concentration.  Rated depression and hopeless 9, anxiety 8.  Denied withdrawals.  Then checked agitation, irritability.  SI, sometimes, contracts for safety.  Denied physical problems.  Goal is figure out what she needs to focus on so she can improve.  Plans to talk to others, writing.  Needs to see SW.  No discharge plans. A:  Medications administered per MD orders.  Emotional support and encouragement given patient. R:  Denied HI.  SI, sometimes, no plan, contracts for safety.  Denied A/V hallucinations.  Safety maintained with 15 minute checks.

## 2018-09-09 NOTE — Tx Team (Signed)
Interdisciplinary Treatment and Diagnostic Plan Update  09/09/2018 Time of Session:  Christine [name] MRN: [id number]  Principal Diagnosis: Bipolar 1 disorder, depressed, severe ([place])  Secondary Diagnoses: Principal Problem:   Bipolar 1 disorder, depressed, severe ([place])   Current Medications:  Current Facility-Administered Medications  Medication Dose Route Frequency Provider Last Rate Last Dose  . acetaminophen (TYLENOL) tablet 650 mg  650 mg Oral Q6H PRN Lindon Romp A, NP      . albuterol (PROVENTIL HFA;VENTOLIN HFA) 108 (90 Base) MCG/ACT inhaler 2 puff  2 puff Inhalation Q4H PRN Lindon Romp A, NP      . alum & mag hydroxide-simeth (MAALOX/MYLANTA) 200-200-20 MG/5ML suspension 30 mL  30 mL Oral Q4H PRN Lindon Romp A, NP      . amLODipine (NORVASC) tablet 10 mg  10 mg Oral Daily Lindon Romp A, NP   10 mg at 09/09/18 0843  . ARIPiprazole (ABILIFY) tablet 5 mg  5 mg Oral Daily Cobos, Myer Peer, MD   5 mg at 09/09/18 5638  . hydrOXYzine (ATARAX/VISTARIL) tablet 25 mg  25 mg Oral TID PRN Rozetta Nunnery, NP      . lamoTRIgine (LAMICTAL) tablet 150 mg  150 mg Oral BID Cobos, Myer Peer, MD   150 mg at 09/09/18 0843  . lithium carbonate capsule 600 mg  600 mg Oral BID WC Cobos, Myer Peer, MD   600 mg at 09/09/18 0844  . magnesium hydroxide (MILK OF MAGNESIA) suspension 30 mL  30 mL Oral Daily PRN Lindon Romp A, NP      . metoprolol succinate (TOPROL-XL) 24 hr tablet 50 mg  50 mg Oral Daily Cobos, Myer Peer, MD   50 mg at 09/09/18 9373   PTA Medications: Medications Prior to Admission  Medication Sig Dispense Refill Last Dose  . acetaminophen (TYLENOL) 500 MG tablet Take 1,000 mg by mouth daily as needed for headache.   Past Week at Unknown time  . Albuterol Sulfate (PROAIR HFA IN) Inhale into the lungs every 6 (six) hours as needed. 1 puff every 6 hours PRN   Not Taking at Unknown time  . amLODipine (NORVASC) 10 MG tablet Take 1 tablet (10 mg total) by mouth daily. 30 tablet 0 09/07/2018  at Unknown time  . Brexpiprazole ([name]) 2 MG TABS Take 2 mg by mouth daily. (Patient not taking: Reported on 09/07/2018) 30 tablet 0 Not Taking at Unknown time  . clonazePAM (KLONOPIN) 0.5 MG tablet Take 1 tablet (0.5 mg total) by mouth 2 (two) times daily as needed for anxiety. 60 tablet 0 09/07/2018 at Unknown time  . lamoTRIgine (LAMICTAL) 150 MG tablet Take 2 tablets (300 mg total) by mouth daily. 60 tablet 0 09/07/2018 at Unknown time  . lithium carbonate (ESKALITH) 450 MG CR tablet Take two tab in AM and Two tab in PM 120 tablet 0 09/07/2018 at Unknown time  . metoprolol succinate (TOPROL-XL) 50 MG 24 hr tablet Take 50 mg by mouth daily. Take with or immediately following a meal.   09/07/2018 at 1000 am  . naproxen sodium (ALEVE) 220 MG tablet Take 440 mg by mouth daily as needed (pain).   Past Week at Unknown time  . oxyCODONE (ROXICODONE) 5 MG immediate release tablet Take 1 tablet (5 mg total) by mouth every 6 (six) hours as needed for severe pain. (Patient not taking: Reported on 09/07/2018) 10 tablet 0 Not Taking at Unknown time  . topiramate (TOPAMAX) 50 MG tablet Take 50 mg by mouth daily.  09/07/2018 at Unknown time    Patient Stressors: Financial difficulties Health problems  Patient Strengths: Ability for insight Average or above average intelligence Capable of independent living Communication skills Motivation for treatment/growth Supportive family/friends  Treatment Modalities: Medication Management, Group therapy, Case management,  1 to 1 session with clinician, Psychoeducation, Recreational therapy.   Physician Treatment Plan for Primary Diagnosis: Bipolar 1 disorder, depressed, severe ([place]) Long Term Goal(s): Improvement in symptoms so as ready for discharge Improvement in symptoms so as ready for discharge   Short Term Goals: Ability to identify changes in lifestyle to reduce recurrence of condition will improve Ability to verbalize feelings will improve Ability to disclose  and discuss suicidal ideas Ability to identify and develop effective coping behaviors will improve Ability to identify triggers associated with substance abuse/mental health issues will improve  Medication Management: Evaluate patient's response, side effects, and tolerance of medication regimen.  Therapeutic Interventions: 1 to 1 sessions, Unit Group sessions and Medication administration.  Evaluation of Outcomes: Not Met  Physician Treatment Plan for Secondary Diagnosis: Principal Problem:   Bipolar 1 disorder, depressed, severe ([place])  Long Term Goal(s): Improvement in symptoms so as ready for discharge Improvement in symptoms so as ready for discharge   Short Term Goals: Ability to identify changes in lifestyle to reduce recurrence of condition will improve Ability to verbalize feelings will improve Ability to disclose and discuss suicidal ideas Ability to identify and develop effective coping behaviors will improve Ability to identify triggers associated with substance abuse/mental health issues will improve     Medication Management: Evaluate patient's response, side effects, and tolerance of medication regimen.  Therapeutic Interventions: 1 to 1 sessions, Unit Group sessions and Medication administration.  Evaluation of Outcomes: Not Met   RN Treatment Plan for Primary Diagnosis: Bipolar 1 disorder, depressed, severe ([place]) Long Term Goal(s): Knowledge of disease and therapeutic regimen to maintain health will improve  Short Term Goals: Ability to participate in decision making will improve, Ability to verbalize feelings will improve, Ability to disclose and discuss suicidal ideas, Ability to identify and develop effective coping behaviors will improve and Compliance with prescribed medications will improve  Medication Management: RN will administer medications as ordered by provider, will assess and evaluate patient's response and provide education to patient for prescribed  medication. RN will report any adverse and/or side effects to prescribing provider.  Therapeutic Interventions: 1 on 1 counseling sessions, Psychoeducation, Medication administration, Evaluate responses to treatment, Monitor vital signs and CBGs as ordered, Perform/monitor CIWA, COWS, AIMS and Fall Risk screenings as ordered, Perform wound care treatments as ordered.  Evaluation of Outcomes: Not Met   LCSW Treatment Plan for Primary Diagnosis: Bipolar 1 disorder, depressed, severe ([place]) Long Term Goal(s): Safe transition to appropriate next level of care at discharge, Engage patient in therapeutic group addressing interpersonal concerns.  Short Term Goals: Engage patient in aftercare planning with referrals and resources  Therapeutic Interventions: Assess for all discharge needs, 1 to 1 time with Social worker, Explore available resources and support systems, Assess for adequacy in community support network, Educate family and significant other(s) on suicide prevention, Complete Psychosocial Assessment, Interpersonal group therapy.  Evaluation of Outcomes: Not Met   Progress in Treatment: Attending groups: Yes. Participating in groups: Yes. Taking medication as prescribed: Yes. Toleration medication: Yes. Family/Significant other contact made: No, will contact:  the patient's husband Patient understands diagnosis: Yes. Discussing patient identified problems/goals with staff: Yes. Medical problems stabilized or resolved: Yes. Denies suicidal/homicidal ideation: Yes. Issues/concerns per patient  self-inventory: No. Other:   New problem(s) identified: None   New Short Term/Long Term Goal(s): medication stabilization, elimination of SI thoughts, development of comprehensive mental wellness plan.    Patient Goals:  "I want to find my purpose again"   Discharge Plan or Barriers: Patient reports she is to discharge home with her husband and plans to continue to follow up at [facility]  Outpatient with [name] for medication management and Charolotte Eke for therapy services. CSW will continue to follow for any additional referrals and discharge planning.   Reason for Continuation of Hospitalization: Anxiety Depression Medication stabilization Suicidal ideation  Estimated Length of Stay: 09/14/2018  Attendees: Patient: 09/09/2018 10:32 AM  Physician: Dr. Neita Garnet, MD 09/09/2018 10:32 AM  Nursing: Rise Paganini. Raliegh Ip, RN 09/09/2018 10:32 AM  RN Care Manager: 09/09/2018 10:32 AM  Social Worker: Radonna Ricker, [place] 09/09/2018 10:32 AM  Recreational Therapist:  09/09/2018 10:32 AM  Other: Harriett Sine, NP  09/09/2018 10:32 AM  Other:  09/09/2018 10:32 AM  Other: 09/09/2018 10:32 AM    Scribe for Treatment Team: Marylee Floras, [place] 09/09/2018 10:32 AM

## 2018-09-10 ENCOUNTER — Ambulatory Visit (HOSPITAL_COMMUNITY): Admission: RE | Admit: 2018-09-10 | Payer: BC Managed Care – PPO | Source: Home / Self Care | Admitting: Surgery

## 2018-09-10 ENCOUNTER — Encounter (HOSPITAL_COMMUNITY): Admission: RE | Payer: Self-pay | Source: Home / Self Care

## 2018-09-10 SURGERY — REPAIR, HERNIA, UMBILICAL, ADULT
Anesthesia: General

## 2018-09-10 NOTE — Plan of Care (Signed)
Nurse discussed anxiety, depression and coping skills with patient.  

## 2018-09-10 NOTE — Progress Notes (Signed)
Pt attended wrap-up group this evening. Pt appears depressed/anxious in affect and mood. Pt brightens on approach.Pt denies HI/AVH/Pain at this time. Endorses SI but verbal contracts for safety. No new c/o's. Pt states good sleep with current regimen. Pt would benefit from group therapy once d/c. Support offered. Will continue with POC.

## 2018-09-10 NOTE — Progress Notes (Signed)
D:  Patient's self inventory sheet, patient sleeps good, sleep medication is helpful.  Fair appetite, low energy level, poor concentration.  Rated depression 8, hopeless 9 anxiety 7.  Denied withdrawals.  Checked irritability.  SI, sometimes, contracts for safety.  Denied physical problems.  Denied physical pain.  Goal is manage emotions.  Plans to talk to others.  No discharge plans. A:  Medications administered per MD orders.  Emotional support and encouragement given patient. R:  SI to cut this morning, contracts for safety.  Denied HI.  Denied A/V hallucinations.  Treatment team informed.  Safety maintained with 15 minute checks. MHT also informed.

## 2018-09-10 NOTE — [facility] Group Notes (Signed)
[facility] Group Therapy      09/10/2018 3:19 PM  Type of Therapy: Mental [facility] Association Presentation  Participation Level: Active  Participation Quality: Attentive  Affect: Appropriate  Cognitive: Oriented  Insight: Developing/Improving  Engagement in Therapy: Engaged  Modes of Intervention: Discussion, Education and Socialization  Summary of Progress/Problems: [place] ([place] Speaker came to talk about his personal journey with mental health. The pt processed ways by which to relate to the speaker. [place] speaker provided handouts and educational information pertaining to groups and services offered by the [facility]. Pt was engaged in speaker's presentation and was receptive to resources provided.    [place] Social Worker

## 2018-09-10 NOTE — Progress Notes (Signed)
[facility] MD Progress Note  09/10/2018 12:30 PM Christine [name]  MRN:  [id number] Subjective:  Patient describes some improvement compared to her admission presentation but reports persistent depression, as well as subjective sense of brief mood swings . Denies suicidal ideations, but states she has had intermittent thoughts of self cutting, although denies plan or intention. Denies medication side effects. Objective : I have discussed case with treatment team and have met with patient. 43 year old married female, presented due to worsening depression, neuro-vegetative symptoms, suicidal ideations. History of Bipolar Disorder diagnosis, prior psychiatric admissions . Currently reports partial improvement compared to admission but reports persistent depression, subjective short lived mood swings, and states she had urges to cut self yesterday " because I felt really bad being away from my kids", although without actual plan or intent. Today denies suicidal or self injurious plans or intent. Denies medication side effects. Li level is therapeutic at 0.85)  No disruptive or agitated behaviors on unit, visible in dayroom, going to some groups.    Principal Problem: Bipolar 1 disorder, depressed, severe ([place]) Diagnosis: Principal Problem:   Bipolar 1 disorder, depressed, severe ([place])  Total Time spent with patient: 20 minutes  Past Psychiatric History: See admission H&P  Past Medical History:  Past Medical History:  Diagnosis Date  . Anemia   . Anxiety   . Arthritis    Hands, ankles  . Bipolar 1 disorder ([place])   . Depression   . Fibroids   . Headache   . High blood pressure   . History of blood transfusion 08/2017   [facility]  . History of hiatal hernia   . Mental disorder   . PCOS (polycystic ovarian syndrome)   . Seasonal allergies     Past Surgical History:  Procedure Laterality Date  . CERVICAL POLYPECTOMY  01/15/2018   Procedure: CERVICAL POLYPECTOMY;  Surgeon: Arvella Nigh, MD;   Location: [facility];  Service: Gynecology;;  . COLONOSCOPY  2008   Normal  . DIAGNOSTIC LAPAROSCOPY  08/1999   dermoid cyst, [facility]  . DILATION AND CURETTAGE OF UTERUS  05/2004   [facility]  . DILATION AND CURETTAGE OF UTERUS  04/2015  . HYSTEROSCOPY W/D&C  07/22/2011   Procedure: DILATATION AND CURETTAGE /HYSTEROSCOPY;  Surgeon: Cyril Mourning, MD;  Location: [facility];  Service: Gynecology;;  . HYSTEROSCOPY W/D&C N/A 01/15/2018   Procedure: DILATATION AND CURETTAGE [place] WITH MYOSURE;  Surgeon: Arvella Nigh, MD;  Location: [facility];  Service: Gynecology;  Laterality: N/A;   Family History:  Family History  Problem Relation Age of Onset  . Anxiety disorder Mother   . Depression Mother   . Cancer Father        colon  . Cancer Brother 6       leukemia, childhood   Family Psychiatric  History: See admission H&P Social History:  Social History   Substance and Sexual Activity  Alcohol Use Yes   Comment: occasional     Social History   Substance and Sexual Activity  Drug Use No    Social History   Socioeconomic History  . Marital status: Married    Spouse name: Not on file  . Number of children: Not on file  . Years of education: Not on file  . Highest education level: Not on file  Occupational History  . Not on file  Social Needs  . Financial resource strain: Somewhat hard  . Food insecurity:    Worry: Sometimes true    Inability: Sometimes true  .  Transportation needs:    Medical: No    Non-medical: No  Tobacco Use  . Smoking status: Never Smoker  . Smokeless tobacco: Never Used  Substance and Sexual Activity  . Alcohol use: Yes    Comment: occasional  . Drug use: No  . Sexual activity: Yes    Partners: Male    Birth control/protection: None  Lifestyle  . Physical activity:    Days per week: 0 days    Minutes per session: Not on file  . Stress: Rather much  Relationships  . Social connections:    Talks on phone: Never    Gets together: Never    Attends  religious service: Never    Active member of club or organization: No    Attends meetings of clubs or organizations: Never    Relationship status: Not on file  Other Topics Concern  . Not on file  Social History Narrative  . Not on file   Additional Social History:   Sleep: Improving  Appetite:  Fair  Current Medications: Current Facility-Administered Medications  Medication Dose Route Frequency Provider Last Rate Last Dose  . acetaminophen (TYLENOL) tablet 650 mg  650 mg Oral Q6H PRN Lindon Romp A, NP      . albuterol (PROVENTIL HFA;VENTOLIN HFA) 108 (90 Base) MCG/ACT inhaler 2 puff  2 puff Inhalation Q4H PRN Lindon Romp A, NP      . alum & mag hydroxide-simeth (MAALOX/MYLANTA) 200-200-20 MG/5ML suspension 30 mL  30 mL Oral Q4H PRN Lindon Romp A, NP      . amLODipine (NORVASC) tablet 10 mg  10 mg Oral Daily Lindon Romp A, NP   10 mg at 09/10/18 0809  . ARIPiprazole (ABILIFY) tablet 10 mg  10 mg Oral Daily Connye Burkitt, NP   10 mg at 09/10/18 0810  . hydrOXYzine (ATARAX/VISTARIL) tablet 25 mg  25 mg Oral TID PRN Rozetta Nunnery, NP   25 mg at 09/09/18 2223  . lamoTRIgine (LAMICTAL) tablet 150 mg  150 mg Oral BID Amori Colomb, Myer Peer, MD   150 mg at 09/10/18 0809  . lithium carbonate capsule 600 mg  600 mg Oral BID WC Abiageal Blowe, Myer Peer, MD   600 mg at 09/10/18 0810  . magnesium hydroxide (MILK OF MAGNESIA) suspension 30 mL  30 mL Oral Daily PRN Rozetta Nunnery, NP      . Melatonin TABS 3 mg  3 mg Oral QHS PRN Sharma Covert, MD   3 mg at 09/09/18 2223  . metoprolol succinate (TOPROL-XL) 24 hr tablet 50 mg  50 mg Oral Daily Toshia Larkin, Myer Peer, MD   50 mg at 09/10/18 0809    Lab Results:  Results for orders placed or performed during the hospital encounter of 09/08/18 (from the past 48 hour(s))  Lithium level     Status: None   Collection Time: 09/08/18  6:31 PM  Result Value Ref Range   Lithium Lvl 0.85 0.60 - 1.20 mmol/L    Comment: Performed at [facility],  [place] [address], [place], [place] [address]    Blood Alcohol level:  Lab Results  Component Value Date   ETH <10 12/45/8099    Metabolic Disorder Labs: No results found for: HGBA1C, MPG No results found for: PROLACTIN No results found for: CHOL, TRIG, HDL, CHOLHDL, VLDL, LDLCALC  Physical Findings: AIMS: Facial and Oral Movements Muscles of Facial Expression: None, normal Lips and Perioral Area: None, normal Jaw: None, normal Tongue: None, normal,Extremity  Movements Upper (arms, wrists, hands, fingers): None, normal Lower (legs, knees, ankles, toes): None, normal, Trunk Movements Neck, shoulders, hips: None, normal, Overall Severity Severity of abnormal movements (highest score from questions above): None, normal Incapacitation due to abnormal movements: None, normal Patient's awareness of abnormal movements (rate only patient's report): No Awareness, Dental Status Current problems with teeth and/or dentures?: No Does patient usually wear dentures?: No  CIWA:  CIWA-Ar Total: 1 COWS:  COWS Total Score: 2  Musculoskeletal: Strength & Muscle Tone: within normal limits Gait & Station: normal Patient leans: N/A  Psychiatric Specialty Exam: Physical Exam  Nursing note and vitals reviewed. Constitutional: She is oriented to person, place, and time. She appears well-developed and well-nourished.  Cardiovascular: Normal rate.  Respiratory: Effort normal.  Neurological: She is alert and oriented to person, place, and time.    Review of Systems  Constitutional: Negative.   Psychiatric/Behavioral: Positive for depression and suicidal ideas. Negative for hallucinations, memory loss and substance abuse. The patient is nervous/anxious and has insomnia.   No nausea, no vomiting, no rash, no fever, no chills  Blood pressure 136/86, pulse 96, temperature 98.7 F (37.1 C), temperature source Oral, resp. rate 18, height '5\' 5"'$  (1.651 m), weight (!) 153.3 kg, last menstrual period  08/24/2018.Body mass index is 56.25 kg/m.  General Appearance: Improving grooming  Eye Contact:  Good  Speech:  Normal Rate  Volume:  Normal  Mood:  Partially improved mood, vaguely anxious  Affect:  Constricted, anxious, tends to improve during session, smiles at times appropriately  Thought Process:  Linear and Descriptions of Associations: Intact  Orientation:  Other:  Fully alert and attentive  Thought Content:  No current hallucinations, no delusions  Suicidal Thoughts:  Yes.  without intent/plan-reports intermittent thoughts of cutting to alleviate negative affects, denies actual plan or intention of hurting herself, is able to contract for safety at this time, currently denies suicidal plan or intention, no homicidal ideations  Homicidal Thoughts:  No  Memory: Recent and remote grossly intact  Judgement:  Fair/improving  Insight:  Fair  Psychomotor Activity:  Normal-no psychomotor agitation or restlessness noted, no distal tremors noted or reported  Concentration:  Concentration: Good and Attention Span: Good  Recall:  Good  Fund of Knowledge:  Good  Language:  Good  Akathisia:  No  Handed:  Right  AIMS (if indicated):     Assets:  Communication Skills Desire for Improvement Housing Resilience Social Support  ADL's:  Intact  Cognition:  WNL  Sleep:  Number of Hours: 6   Assessment: 43 year old married female, presented due to worsening depression, neuro-vegetative symptoms, suicidal ideations. History of Bipolar Disorder diagnosis, prior psychiatric admissions .  Patient presents with partial improvement.  She reports lingering depression and subjective short-lived mood swings/vague irritability.  Today denies suicidal ideations but reports intermittent thoughts of self cutting, without any actual plan or intention of hurting self at this time.  Currently tolerating medications well, was recently started on Abilify in addition to Lithium/Lamictal.  Has reported being on  Abilify in the past with good tolerance and response.  Lithium level therapeutic.   Treatment Plan Summary: Daily contact with patient to assess and evaluate symptoms and progress in treatment and Medication management  Treatment plan reviewed as below today 2/6 Continue Abilify 10 mg PO QDAY  for mood disorder/depression Continue Melatonin 3 mg PO QHS PRN insomnia Continue Lithium 600 mg PO BID for mood disorder  Continue Lamictal 150 mg PO BID for mood  disorder  Continue Vistaril 25 mg PO TID PRN anxiety Continue Metoprolol XL 50 mg PO daily for HTN Continue Norvasc 10 mg PO daily for HTN Continue Albuterol (inhaler) Q4HR PRN SOB Treatment team working on disposition planning options  Jenne Campus, MD 09/10/2018, 12:30 PM   Patient ID: Louis Meckel, female   DOB: 1976/03/29, 43 y.o.   MRN: [id number]

## 2018-09-11 ENCOUNTER — Encounter (HOSPITAL_COMMUNITY): Payer: Self-pay | Admitting: Behavioral Health

## 2018-09-11 MED ORDER — OSELTAMIVIR PHOSPHATE 75 MG PO CAPS
75.0000 mg | ORAL_CAPSULE | Freq: Every day | ORAL | Status: DC
  Administered 2018-09-11 – 2018-09-12 (×2): 75 mg via ORAL
  Filled 2018-09-11: qty 1
  Filled 2018-09-11: qty 6
  Filled 2018-09-11 (×3): qty 1

## 2018-09-11 NOTE — Progress Notes (Signed)
[facility] MD Progress Note  09/11/2018 1:01 PM Amarri Michaelson  MRN:  [id number]  Subjective: " I am feeling much better. The medication is going well."     Patient describes some improvement compared to her admission presentation but reports persistent depression, as well as subjective sense of brief mood swings . Denies suicidal ideations, but states she has had intermittent thoughts of self cutting, although denies plan or intention. Denies medication side effects.  Objective : Daily rounding completed with MD and PMHNP student, case discussed with treatment team and chart reviewed.  In brief;  This is a 43 year old married female, presented due to worsening depression, neuro-vegetative symptoms, suicidal ideations. History of Bipolar Disorder diagnosis Today,patient reports she is feeling less depressed and describes partially improvment mood. On observation, her mood does appear improved, and today exhibits a fuller range of affect. She admits to heaving some urges to cut yesterday morning , not as suicide attempt, but as a way of " feeling better". Describes long history of self cutting in order to relieve negative affects, but not recently. She denies any self injurious ideations today, and denies any suicidal ideations. As she improves she is becoming more future oriented and focusing more on disposition planning .Mood lability has improved as well,  as per patient report.  She denies medication side effects.  She is active in unit activities without disruptive or agitated behaviors.    Principal Problem: Bipolar 1 disorder, depressed, severe ([place]) Diagnosis: Principal Problem:   Bipolar 1 disorder, depressed, severe ([place])  Total Time spent with patient: 20 minutes  Past Psychiatric History: See admission H&P  Past Medical History:  Past Medical History:  Diagnosis Date  . Anemia   . Anxiety   . Arthritis    Hands, ankles  . Bipolar 1 disorder ([place])   . Depression   . Fibroids    . Headache   . High blood pressure   . History of blood transfusion 08/2017   [facility]  . History of hiatal hernia   . Mental disorder   . PCOS (polycystic ovarian syndrome)   . Seasonal allergies     Past Surgical History:  Procedure Laterality Date  . CERVICAL POLYPECTOMY  01/15/2018   Procedure: CERVICAL POLYPECTOMY;  Surgeon: Arvella Nigh, MD;  Location: [facility];  Service: Gynecology;;  . COLONOSCOPY  2008   Normal  . DIAGNOSTIC LAPAROSCOPY  08/1999   dermoid cyst, [facility]  . DILATION AND CURETTAGE OF UTERUS  05/2004   [facility]  . DILATION AND CURETTAGE OF UTERUS  04/2015  . HYSTEROSCOPY W/D&C  07/22/2011   Procedure: DILATATION AND CURETTAGE /HYSTEROSCOPY;  Surgeon: Cyril Mourning, MD;  Location: [facility];  Service: Gynecology;;  . HYSTEROSCOPY W/D&C N/A 01/15/2018   Procedure: DILATATION AND CURETTAGE [place] WITH MYOSURE;  Surgeon: Arvella Nigh, MD;  Location: [facility];  Service: Gynecology;  Laterality: N/A;   Family History:  Family History  Problem Relation Age of Onset  . Anxiety disorder Mother   . Depression Mother   . Cancer Father        colon  . Cancer Brother 6       leukemia, childhood   Family Psychiatric  History: See admission H&P Social History:  Social History   Substance and Sexual Activity  Alcohol Use Yes   Comment: occasional     Social History   Substance and Sexual Activity  Drug Use No    Social History   Socioeconomic History  . Marital  status: Married    Spouse name: Not on file  . Number of children: Not on file  . Years of education: Not on file  . Highest education level: Not on file  Occupational History  . Not on file  Social Needs  . Financial resource strain: Somewhat hard  . Food insecurity:    Worry: Sometimes true    Inability: Sometimes true  . Transportation needs:    Medical: No    Non-medical: No  Tobacco Use  . Smoking status: Never Smoker  . Smokeless tobacco: Never Used  Substance and Sexual Activity  .  Alcohol use: Yes    Comment: occasional  . Drug use: No  . Sexual activity: Yes    Partners: Male    Birth control/protection: None  Lifestyle  . Physical activity:    Days per week: 0 days    Minutes per session: Not on file  . Stress: Rather much  Relationships  . Social connections:    Talks on phone: Never    Gets together: Never    Attends religious service: Never    Active member of club or organization: No    Attends meetings of clubs or organizations: Never    Relationship status: Not on file  Other Topics Concern  . Not on file  Social History Narrative  . Not on file   Additional Social History:   Sleep: Improving  Appetite:  improving   Current Medications: Current Facility-Administered Medications  Medication Dose Route Frequency Provider Last Rate Last Dose  . acetaminophen (TYLENOL) tablet 650 mg  650 mg Oral Q6H PRN Lindon Romp A, NP      . albuterol (PROVENTIL HFA;VENTOLIN HFA) 108 (90 Base) MCG/ACT inhaler 2 puff  2 puff Inhalation Q4H PRN Lindon Romp A, NP      . alum & mag hydroxide-simeth (MAALOX/MYLANTA) 200-200-20 MG/5ML suspension 30 mL  30 mL Oral Q4H PRN Lindon Romp A, NP      . amLODipine (NORVASC) tablet 10 mg  10 mg Oral Daily Lindon Romp A, NP   10 mg at 09/11/18 0829  . ARIPiprazole (ABILIFY) tablet 10 mg  10 mg Oral Daily Connye Burkitt, NP   10 mg at 09/11/18 2542  . hydrOXYzine (ATARAX/VISTARIL) tablet 25 mg  25 mg Oral TID PRN Rozetta Nunnery, NP   25 mg at 09/10/18 2221  . lamoTRIgine (LAMICTAL) tablet 150 mg  150 mg Oral BID Cobos, Myer Peer, MD   150 mg at 09/11/18 0829  . lithium carbonate capsule 600 mg  600 mg Oral BID WC Cobos, Myer Peer, MD   600 mg at 09/11/18 0830  . magnesium hydroxide (MILK OF MAGNESIA) suspension 30 mL  30 mL Oral Daily PRN Rozetta Nunnery, NP      . Melatonin TABS 3 mg  3 mg Oral QHS PRN Sharma Covert, MD   3 mg at 09/10/18 2221  . metoprolol succinate (TOPROL-XL) 24 hr tablet 50 mg  50 mg Oral Daily  Cobos, Myer Peer, MD   50 mg at 09/11/18 7062    Lab Results:  No results found for this or any previous visit (from the past 48 hour(s)).  Blood Alcohol level:  Lab Results  Component Value Date   ETH <10 37/62/8315    Metabolic Disorder Labs: No results found for: HGBA1C, MPG No results found for: PROLACTIN No results found for: CHOL, TRIG, HDL, CHOLHDL, VLDL, LDLCALC  Physical Findings: AIMS: Facial and Oral Movements  Muscles of Facial Expression: None, normal Lips and Perioral Area: None, normal Jaw: None, normal Tongue: None, normal,Extremity Movements Upper (arms, wrists, hands, fingers): None, normal Lower (legs, knees, ankles, toes): None, normal, Trunk Movements Neck, shoulders, hips: None, normal, Overall Severity Severity of abnormal movements (highest score from questions above): None, normal Incapacitation due to abnormal movements: None, normal Patient's awareness of abnormal movements (rate only patient's report): No Awareness, Dental Status Current problems with teeth and/or dentures?: No Does patient usually wear dentures?: No  CIWA:  CIWA-Ar Total: 1 COWS:  COWS Total Score: 2  Musculoskeletal: Strength & Muscle Tone: within normal limits Gait & Station: normal Patient leans: N/A  Psychiatric Specialty Exam: Physical Exam  Nursing note and vitals reviewed. Constitutional: She is oriented to person, place, and time. She appears well-developed and well-nourished.  Cardiovascular: Normal rate.  Respiratory: Effort normal.  Neurological: She is alert and oriented to person, place, and time.    Review of Systems  Constitutional: Negative.   Psychiatric/Behavioral: Positive for depression. Negative for hallucinations, memory loss, substance abuse and suicidal ideas. The patient is not nervous/anxious and does not have insomnia.   No nausea, no vomiting, no rash, no fever, no chills  Blood pressure 139/88, pulse 97, temperature 99.2 F (37.3 C),  temperature source Oral, resp. rate 20, height 5\' 5"  (1.651 m), weight (!) 153.3 kg, last menstrual period 08/24/2018.Body mass index is 56.25 kg/m.  General Appearance: improved grooming   Eye Contact:  Good  Speech:  Normal Rate  Volume:  Normal  Mood:  Mood has continued to improve, today reports feeling " better"  Affect:  reactive, less anxious  Thought Process:  Linear and Descriptions of Associations: Intact  Orientation:  Other:  Fully alert and attentive  Thought Content:  No current hallucinations, no delusions  Suicidal Thoughts:  No-denies SI or self-harming urges at this time , contracts for safety on unit, denies HI or violent ideations  Homicidal Thoughts:  No  Memory: Recent and remote grossly intact  Judgement:  Fair/improving  Insight:  Fair  Psychomotor Activity:  Normal-no psychomotor agitation or restlessness noted, no distal tremors noted or reported  Concentration:  Concentration: Good and Attention Span: Good  Recall:  Good  Fund of Knowledge:  Good  Language:  Good  Akathisia:  No  Handed:  Right  AIMS (if indicated):     Assets:  Communication Skills Desire for Improvement Housing Resilience Social Support  ADL's:  Intact  Cognition:  WNL  Sleep:  Number of Hours: 5.75   Assessment: 43 year old married female, presented due to worsening depression, neuro-vegetative symptoms, suicidal ideations. History of Bipolar Disorder diagnosis, prior psychiatric admissions .  Patient presents with improving  mood and improving range of affect. Tolerating medications well at this time, denies side effects. Has history of self cutting in the past and reports having had  urges to cut yesterday due to feeling upset at the time, but denies any self injurious or SI and presently is future oriented, presenting euthymic at this time, and future oriented .    Treatment Plan Summary: Daily contact with patient to assess and evaluate symptoms and progress in treatment and  Medication management  Treatment plan reviewed 09/11/2018. No adjustments will be made at this time.  Continue Abilify 10 mg PO QDAY  for mood disorder/depression Continue Melatonin 3 mg PO QHS PRN insomnia Continue Lithium 600 mg PO BID for mood disorder  Continue Lamictal 150 mg PO BID for mood disorder  Continue Vistaril 25 mg PO TID PRN anxiety Continue Metoprolol XL 50 mg PO daily for HTN Continue Norvasc 10 mg PO daily for HTN Continue Albuterol (inhaler) Q4HR PRN SOB Treatment team working on disposition planning.  Gabriel Earing MD  Patient ID: Briele Lagasse, female   DOB: [birth year], 43 y.o.   MRN: [id number]

## 2018-09-11 NOTE — Progress Notes (Signed)
Recreation Therapy Notes  Date:  2.7.[name]20 Time: 0930 Location: [address] Dayroom  Group Topic: Stress Management  Goal Area(s) Addresses:  Patient will identify positive stress management techniques. Patient will identify benefits of using stress management post d/c.  Behavioral Response:  Engaged  Intervention:  Stress Management  Activity :   Meditation.  LRT introduced the stress management technique of meditation.  LRT played a meditation that focused on the idea of "pure possibility" to bring into the day.  Patients were to listen to the meditation and follow along as it played to engage.  Education:  Stress Management, Discharge Planning.   Education Outcome: Acknowledges Education  Clinical Observations/Feedback:  Pt attended and participated in group session.      Victorino Sparrow, LRT/CTRS         Victorino Sparrow A 09/11/2018 11:28 AM

## 2018-09-11 NOTE — Progress Notes (Signed)
Progress note: Patient has been identified as potentially exposed to other patient with influenza. We will provide prophylaxis with tamiflu.

## 2018-09-11 NOTE — Progress Notes (Signed)
D: Patient is visible in the milieu.  She denies any thoughts of self harm.  P[atient was given tamiflu due to possible exposure to flu.  Patient also put on her self inventory that she was not suicidal.  She also states that she is leaving tomorrow.  A nursing student approached me and stated that patient stated that she might hurt herself with a pencil.  Patient has not indicated that to staff.  A: Continue to monitor medication management and MD orders.  Safety checks completed every 15 minutes per protocol.  Offer support and encouragement as needed.  R: Patient is receptive to staff; his/her behavior is appropriate.

## 2018-09-11 NOTE — [facility] Suicide Risk Assessment (Signed)
[facility] INPATIENT:  Family/Significant Other Suicide Prevention Education  Suicide Prevention Education:  Education Completed; husband, Juliani Laduke [phone]) has been identified by the patient as the family member/significant other with whom the patient will be residing, and identified as the person(s) who will aid the patient in the event of a mental health crisis (suicidal ideations/suicide attempt).  With written consent from the patient, the family member/significant other has been provided the following suicide prevention education, prior to the and/or following the discharge of the patient.  The suicide prevention education provided includes the following:  Suicide risk factors  Suicide prevention and interventions  National Suicide Hotline telephone number  [facility] assessment telephone number  [facility] Emergency Assistance [place] and/or Residential Mobile Crisis Unit telephone number  Request made of family/significant other to:  Remove weapons (e.g., guns, rifles, knives), all items previously/currently identified as safety concern.    Remove drugs/medications (over-the-counter, prescriptions, illicit drugs), all items previously/currently identified as a safety concern.  The family member/significant other verbalizes understanding of the suicide prevention education information provided.  The family member/significant other agrees to remove the items of safety concern listed above.   Patient's husband reports he does not have questions or concerns at this time. CSW will continue to follow and assist with a safe and appropriate discharge plan.   Marylee Floras 09/11/2018, 12:47 PM

## 2018-09-11 NOTE — Progress Notes (Addendum)
Pt attended wrap-up group this evening. Pt appears depressed/anxious in affect and mood. Pt brightens on approach.Pt denies HI/AVH/Pain at this time. EndorsesSI but Music therapist for safety. Pt states she goes through rapid cycles of mood change. Pt states she woke up intermittently last night. Pt was encourage to speak with provider. Pt states she might get to be d/c tomorrow. Pt states she felt ready. Support offered. Will continue with POC.

## 2018-09-12 MED ORDER — LITHIUM CARBONATE 600 MG PO CAPS: 600.0000 mg | ORAL_CAPSULE | Freq: Two times a day (BID) | ORAL | 1 refills | Status: DC

## 2018-09-12 MED ORDER — LAMOTRIGINE 150 MG PO TABS: 150.0000 mg | ORAL_TABLET | Freq: Two times a day (BID) | ORAL | 1 refills | Status: DC

## 2018-09-12 MED ORDER — LAMOTRIGINE 150 MG PO TABS: 150.0000 mg | ORAL_TABLET | Freq: Two times a day (BID) | ORAL | 0 refills | Status: DC

## 2018-09-12 MED ORDER — MELATONIN 3 MG PO TABS: 3.0000 mg | ORAL_TABLET | Freq: Every evening | ORAL | 0 refills | Status: DC | PRN

## 2018-09-12 MED ORDER — HYDROXYZINE HCL 25 MG PO TABS: 25.0000 mg | ORAL_TABLET | Freq: Three times a day (TID) | ORAL | 1 refills | Status: DC | PRN

## 2018-09-12 MED ORDER — OSELTAMIVIR PHOSPHATE 75 MG PO CAPS: 75.0000 mg | ORAL_CAPSULE | Freq: Every day | ORAL | 0 refills | Status: DC

## 2018-09-12 MED ORDER — MELATONIN 3 MG PO TABS: 3.0000 mg | ORAL_TABLET | Freq: Every evening | ORAL | 1 refills | Status: DC | PRN

## 2018-09-12 MED ORDER — ARIPIPRAZOLE 10 MG PO TABS: 10.0000 mg | ORAL_TABLET | Freq: Every day | ORAL | 0 refills | Status: DC

## 2018-09-12 MED ORDER — HYDROXYZINE HCL 25 MG PO TABS: 25.0000 mg | ORAL_TABLET | Freq: Three times a day (TID) | ORAL | 0 refills | Status: DC | PRN

## 2018-09-12 MED ORDER — LITHIUM CARBONATE 600 MG PO CAPS: 600.0000 mg | ORAL_CAPSULE | Freq: Two times a day (BID) | ORAL | 0 refills | Status: DC

## 2018-09-12 MED ORDER — ARIPIPRAZOLE 10 MG PO TABS: 10.0000 mg | ORAL_TABLET | Freq: Every day | ORAL | 1 refills | Status: DC

## 2018-09-12 MED ORDER — ACETAMINOPHEN 325 MG PO TABS: 650.0000 mg | ORAL_TABLET | Freq: Four times a day (QID) | ORAL | 0 refills | Status: DC | PRN

## 2018-09-12 NOTE — Discharge Summary (Addendum)
Physician Discharge Summary Note  Patient:  Christine [name] is an 43 y.o., female MRN:  [id number] DOB:  1976-02-23 Patient phone:  [phone] (home)  Patient address:   [place] [address],  Total Time spent with patient: 15 minutes  Date of Admission:  09/08/2018 Date of Discharge: 09/12/2018  Reason for Admission:  Depression with suicidal ideation  Principal Problem: Bipolar 1 disorder, depressed, severe ([place]) Discharge Diagnoses: Principal Problem:   Bipolar 1 disorder, depressed, severe ([place])   Past Psychiatric History: Per admission H&P: Long history of bipolar disorder, receiving disability since 2009. Reports prior manic, hypomanic, and mixed episodes. Denies hx AVH. 3 prior hospitalizations, with most recent in November 2017 for SI with mixed symptoms. Hx of SIB with last episode of cutting summer 2019. Denies history of suicide attempts. Currently in outpatient therapy and psychiatry at [facility].  Past Medical History:  Past Medical History:  Diagnosis Date  . Anemia   . Anxiety   . Arthritis    Hands, ankles  . Bipolar 1 disorder ([place])   . Depression   . Fibroids   . Headache   . High blood pressure   . History of blood transfusion 08/2017   [facility]  . History of hiatal hernia   . Mental disorder   . PCOS (polycystic ovarian syndrome)   . Seasonal allergies     Past Surgical History:  Procedure Laterality Date  . CERVICAL POLYPECTOMY  01/15/2018   Procedure: CERVICAL POLYPECTOMY;  Surgeon: Arvella Nigh, MD;  Location: [facility];  Service: Gynecology;;  . COLONOSCOPY  2008   Normal  . DIAGNOSTIC LAPAROSCOPY  08/1999   dermoid cyst, [facility]  . DILATION AND CURETTAGE OF UTERUS  05/2004   [facility]  . DILATION AND CURETTAGE OF UTERUS  04/2015  . HYSTEROSCOPY W/D&C  07/22/2011   Procedure: DILATATION AND CURETTAGE /HYSTEROSCOPY;  Surgeon: Cyril Mourning, MD;  Location: [facility];  Service: Gynecology;;  . HYSTEROSCOPY W/D&C N/A 01/15/2018   Procedure: DILATATION  AND CURETTAGE [place] WITH MYOSURE;  Surgeon: Arvella Nigh, MD;  Location: [facility];  Service: Gynecology;  Laterality: N/A;   Family History:  Family History  Problem Relation Age of Onset  . Anxiety disorder Mother   . Depression Mother   . Cancer Father        colon  . Cancer Brother 6       leukemia, childhood   Family Psychiatric  History: Per admission H&P: Mother has depression and anxiety. Social History:  Social History   Substance and Sexual Activity  Alcohol Use Yes   Comment: occasional     Social History   Substance and Sexual Activity  Drug Use No    Social History   Socioeconomic History  . Marital status: Married    Spouse name: Not on file  . Number of children: Not on file  . Years of education: Not on file  . Highest education level: Not on file  Occupational History  . Not on file  Social Needs  . Financial resource strain: Somewhat hard  . Food insecurity:    Worry: Sometimes true    Inability: Sometimes true  . Transportation needs:    Medical: No    Non-medical: No  Tobacco Use  . Smoking status: Never Smoker  . Smokeless tobacco: Never Used  Substance and Sexual Activity  . Alcohol use: Yes    Comment: occasional  . Drug use: No  . Sexual activity: Yes    Partners:  Male    Birth control/protection: None  Lifestyle  . Physical activity:    Days per week: 0 days    Minutes per session: Not on file  . Stress: Rather much  Relationships  . Social connections:    Talks on phone: Never    Gets together: Never    Attends religious service: Never    Active member of club or organization: No    Attends meetings of clubs or organizations: Never    Relationship status: Not on file  Other Topics Concern  . Not on file  Social History Narrative  . Not on file    Hospital Course:  From admission H&P 09/08/2018: Ms. Pilant is a 43 year old female with history of hypertension, migraines, iron deficiency anemia, and bipolar disorder,  presenting for treatment of suicidal ideation. Patient reports, "I've been going through a lot of stress lately." She was sick with bronchitis during January and was having problems with insurance coverage while sick. It took several weeks to recover from bronchitis, and she reports "I may have been inconsistent with some of my meds" for mental health in the meantime. She reports increased levels of depression over the last week, with decreased sleep (3-4 hours/night), decreased appetite, fatigue, anhedonia, and suicidal ideation. She made a list of methods for suicide, including overdose, cutting self, and crashing her car, and listed out pros/cons for each method. She met with her therapist yesterday and showed her this list, then came to [facility] voluntarily per therapist's recommendation. She continues to have passive SI but denies suicidal intent or plan on the unit and contracts for safety. Denies HI. Denies AVH. Denies hypomanic symptoms currently, other than irritability. She is currently taking lithium 900 mg BID (reports lithium levels have been low while compliant at this dose), Lamictal 150 mg BID, Topamax 50 mg daily, and Klonopin 0.5 mg PRN (reports taking 2-3 times per week). She was taking [name] also but stopped in December due to problems with insurance coverage.  Ms. Ballester was admitted for depression with suicidal ideation. Lithium and Lamictal were continued. Abilify was started and increased to 10 mg daily. PRN melatonin was added for sleep, and PRN Vistaril for anxiety. Ms. Langenderfer remained on the [facility] unit for 5 days. She stabilized with medications and therapy. She was discharged on the medications listed below. She has shown improvement with improved mood, affect, sleep, appetite, and interaction. She denies any SI/HI/AVH and contracts for safety. She agrees to follow up with psychiatrist [name] and counselor Alver Fisher. Patient prefers follow-up with [name] for medication  management, but first available appointment is March 24. Patient was encouraged to call for earlier appointment availability and provided with 30-day prescriptions with 1 refill. Patient reports she will attend scheduled appointment with her counselor on 09/15/18.   Physical Findings: AIMS: Facial and Oral Movements Muscles of Facial Expression: None, normal Lips and Perioral Area: None, normal Jaw: None, normal Tongue: None, normal,Extremity Movements Upper (arms, wrists, hands, fingers): None, normal Lower (legs, knees, ankles, toes): None, normal, Trunk Movements Neck, shoulders, hips: None, normal, Overall Severity Severity of abnormal movements (highest score from questions above): None, normal Incapacitation due to abnormal movements: None, normal Patient's awareness of abnormal movements (rate only patient's report): No Awareness, Dental Status Current problems with teeth and/or dentures?: No Does patient usually wear dentures?: No  CIWA:  CIWA-Ar Total: 1 COWS:  COWS Total Score: 2  Musculoskeletal: Strength & Muscle Tone: within normal limits Gait & Station: normal  Patient leans: N/A  Psychiatric Specialty Exam: Physical Exam  Nursing note and vitals reviewed. Constitutional: She is oriented to person, place, and time. She appears well-developed and well-nourished.  Cardiovascular: Normal rate.  Respiratory: Effort normal.  Neurological: She is alert and oriented to person, place, and time.    Review of Systems  Constitutional: Negative.   Psychiatric/Behavioral: Positive for depression (improving). Negative for hallucinations, memory loss, substance abuse and suicidal ideas. The patient is not nervous/anxious and does not have insomnia.     Blood pressure (!) 138/93, pulse 98, temperature 97.6 F (36.4 C), temperature source Oral, resp. rate 14, height '5\' 5"'$  (1.651 m), weight (!) 153.3 kg, last menstrual period 08/24/2018.Body mass index is 56.25 kg/m.  See MD's  discharge SRA     Have you used any form of tobacco in the last 30 days? (Cigarettes, Smokeless Tobacco, Cigars, and/or Pipes): No  Has this patient used any form of tobacco in the last 30 days? (Cigarettes, Smokeless Tobacco, Cigars, and/or Pipes)  No  Blood Alcohol level:  Lab Results  Component Value Date   ETH <10 88/89/1694    Metabolic Disorder Labs:  No results found for: HGBA1C, MPG No results found for: PROLACTIN No results found for: CHOL, TRIG, HDL, CHOLHDL, VLDL, LDLCALC  See Psychiatric Specialty Exam and Suicide Risk Assessment completed by Attending Physician prior to discharge.  Discharge destination:  Home  Is patient on multiple antipsychotic therapies at discharge:  No   Has Patient had three or more failed trials of antipsychotic monotherapy by history:  No  Recommended Plan for Multiple Antipsychotic Therapies: NA  Discharge Instructions    Discharge instructions   Complete by:  As directed    Patient is instructed to take all prescribed medications as recommended. Report any side effects or adverse reactions to your outpatient psychiatrist. Patient is instructed to abstain from alcohol and illegal drugs while on prescription medications. In the event of worsening symptoms, patient is instructed to call the crisis hotline, 911, or go to the nearest emergency department for evaluation and treatment.     Allergies as of 09/12/2018      Reactions   Penicillins Rash, Other (See Comments)   Has patient had a PCN reaction causing immediate rash, facial/tongue/throat swelling, SOB or lightheadedness with hypotension: Yes Has patient had a PCN reaction causing severe rash involving mucus membranes or skin necrosis: Unknown Has patient had a PCN reaction that required hospitalization: No Has patient had a PCN reaction occurring within the last 10 years: No If all of the above answers are "NO", then may proceed with Cephalosporin use.      Medication List     STOP taking these medications   Brexpiprazole 2 MG Tabs Commonly known as:  [name]   clonazePAM 0.5 MG tablet Commonly known as:  KLONOPIN   lithium carbonate 450 MG CR tablet Commonly known as:  ESKALITH Replaced by:  lithium 600 MG capsule   oxyCODONE 5 MG immediate release tablet Commonly known as:  ROXICODONE   topiramate 50 MG tablet Commonly known as:  TOPAMAX     TAKE these medications     Indication  acetaminophen 325 MG tablet Commonly known as:  TYLENOL Take 2 tablets (650 mg total) by mouth every 6 (six) hours as needed for mild pain or moderate pain. (May buy over the counter) What changed:    medication strength  how much to take  when to take this  reasons to take this  additional  instructions  Indication:  Pain   amLODipine 10 MG tablet Commonly known as:  NORVASC Take 1 tablet (10 mg total) by mouth daily.  Indication:  High Blood Pressure Disorder   ARIPiprazole 10 MG tablet Commonly known as:  ABILIFY Take 1 tablet (10 mg total) by mouth daily. For mood Start taking on:  September 13, 2018  Indication:  Mood   hydrOXYzine 25 MG tablet Commonly known as:  ATARAX/VISTARIL Take 1 tablet (25 mg total) by mouth 3 (three) times daily as needed for anxiety.  Indication:  Feeling Anxious   lamoTRIgine 150 MG tablet Commonly known as:  LAMICTAL Take 1 tablet (150 mg total) by mouth 2 (two) times daily. For mood What changed:    how much to take  when to take this  additional instructions  Indication:  Mood   lithium 600 MG capsule Take 1 capsule (600 mg total) by mouth 2 (two) times daily with a meal. For mood Replaces:  lithium carbonate 450 MG CR tablet  Indication:  Mood   Melatonin 3 MG Tabs Take 1 tablet (3 mg total) by mouth at bedtime as needed (sleep).  Indication:  Trouble Sleeping   metoprolol succinate 50 MG 24 hr tablet Commonly known as:  TOPROL-XL Take 50 mg by mouth daily. Take with or immediately following a meal.   Indication:  High Blood Pressure Disorder   naproxen sodium 220 MG tablet Commonly known as:  ALEVE Take 440 mg by mouth daily as needed (pain).  Indication:  Pain   oseltamivir 75 MG capsule Commonly known as:  TAMIFLU Take 1 capsule (75 mg total) by mouth daily. For flu prevention Start taking on:  September 13, 2018  Indication:  Influenza prevention   PROAIR HFA IN Inhale into the lungs every 6 (six) hours as needed. 1 puff every 6 hours PRN  Indication:  Shortness of breath      Follow-up Information    [facility] Follow up.   Specialty:  Behavioral Health Why:  Medication Management Appointment with [name] 10/27/18. Please bring your current medications and discharge information from this hospitalization with you. Contact information: [place] [phone]          Follow-up recommendations: Activity as tolerated. Diet as recommended by primary care physician. Keep all scheduled follow-up appointments as recommended.   Comments:   Patient is instructed to take all prescribed medications as recommended. Report any side effects or adverse reactions to your outpatient psychiatrist. Patient is instructed to abstain from alcohol and illegal drugs while on prescription medications. In the event of worsening symptoms, patient is instructed to call the crisis hotline, 911, or go to the nearest emergency department for evaluation and treatment.  Signed: Connye Burkitt, NP 09/12/2018, 10:21 AM   Patient seen, Suicide Assessment Completed.  Disposition Plan Reviewed

## 2018-09-12 NOTE — Progress Notes (Signed)
Pt discharged to lobby. Husband present to take pt home.Pt was stable and appreciative at that time. All papers and prescriptions, samples were given and valuables returned. Verbal understanding expressed. Denies SI/HI and A/VH. Pt given opportunity to express concerns and ask questions.

## 2018-09-12 NOTE — BHH Group Notes (Signed)
[facility] LCSW Group Therapy Note  Date/Time:    09/12/2018 10:00-11:00AM  Type of Therapy and Topic:  Group Therapy:  Healthy vs Unhealthy Coping Skills  Participation Level:  Active   Description of Group:  The focus of this group was to determine what unhealthy coping techniques typically are used by group members and what healthy coping techniques would be helpful in coping with various problems. Patients were guided in becoming aware of the differences between healthy and unhealthy coping techniques.  Patients were asked to identify 1-2 healthy coping skills they would like to learn to use more effectively, and many mentioned meditation, breathing, and relaxation.  These were explained, samples demonstrated, and resources shared for how to learn more at discharge.    Therapeutic Goals 1. Patients learned that coping is what human beings do all day long to deal with various situations in their lives 2. Patients defined and discussed healthy vs unhealthy coping techniques 3. Patients identified their preferred coping techniques and identified whether these were healthy or unhealthy 4. Patients determined 1-2 healthy coping skills they would like to become more familiar with and use more often, and practiced a few meditations 5. Patients provided support and ideas to each other  Summary of Patient Progress: During group, patient expressed that she self-harms on her arms, not in suicidal gestures but to relieve pressure.  She has considered getting a tattoo to mark the site as untouchable.  She was interactive and pleasant throughout group.   Therapeutic Modalities Cognitive Behavioral Therapy Motivational Interviewing   Selmer Dominion, LCSW 09/12/2018, 11:24 AM

## 2018-09-12 NOTE — Progress Notes (Signed)
  [facility] Adult Case Management Discharge Plan :  Will you be returning to the same living situation after discharge:  Yes,  with family At discharge, do you have transportation home?: Yes,  family Do you have the ability to pay for your medications: Yes,  but has limited income  Release of information consent forms completed and turned in to Medical Records by CSW.   Patient to Follow up at: Follow-up Information    [facility] Follow up.   Specialty:  Behavioral Health Why:  Medication Management Appointment with [name] 10/27/18. Please bring your current medications and discharge information from this hospitalization with you. Contact information: [place] [phone]          Next level of care provider has access to [place] and Suicide Prevention discussed: Yes,  with husband  Have you used any form of tobacco in the last 30 days? (Cigarettes, Smokeless Tobacco, Cigars, and/or Pipes): No  Has patient been referred to the Quitline?: N/A patient is not a smoker  Patient has been referred for addiction treatment: N/A  Maretta Los, LCSW 09/12/2018, 9:53 AM

## 2018-09-12 NOTE — Progress Notes (Signed)
D. Pt friendly, calm and cooperative and animated upon approach- reports that she is looking forward to discharging today. Pt visible in the milieu interacting well with peers.  Per pts self inventory, pt rates her depression, hopelessness and anxiety all 5's.. Pt currently denies SI/HI and AVH  A. Labs and vitals monitored. Pt compliant with medications. Pt supported emotionally and encouraged to express concerns and ask questions.   R. Pt remains safe with 15 minute checks. Will continue POC.

## 2018-09-12 NOTE — BHH Suicide Risk Assessment (Signed)
[facility] Discharge Suicide Risk Assessment   Principal Problem: Bipolar 1 disorder, depressed, severe ([place]) Discharge Diagnoses: Principal Problem:   Bipolar 1 disorder, depressed, severe ([place])   Total Time spent with patient: 30 minutes  Musculoskeletal: Strength & Muscle Tone: within normal limits Gait & Station: normal Patient leans: N/A  Psychiatric Specialty Exam: ROS  Mild headache, no chest pain, no shortness of breath, no coughing, no vomiting , no fever , no chills   Blood pressure (!) 138/93, pulse 98, temperature 97.6 F (36.4 C), temperature source Oral, resp. rate 14, height 5\' 5"  (1.651 m), weight (!) 153.3 kg, last menstrual period 08/24/2018.Body mass index is 56.25 kg/m.  General Appearance: Well Groomed  Engineer, water::  Fair  Speech:  Normal Rate409  Volume:  Normal  Mood:  patient reports she is feeling noticeably better, and describes mood as 7/10, and states " it is a lot better than before I came in"  Affect:  Appropriate and Full Range  Thought Process:  Linear and Descriptions of Associations: Intact  Orientation:  Full (Time, Place, and Person)  Thought Content:  no hallucinations , no delusions   Suicidal Thoughts:  No denies suicidal ideations, reports a history of chronic /intermittent thoughts of cutting self as a way to deal with stress , but states has not had these thoughts in 2-3 days.   Homicidal Thoughts:  No  Memory:  recent and remote grossly intact   Judgement:  Other:  improving   Insight:  improving   Psychomotor Activity:  Normal- no tremors or psychomotor restlessness noted   Concentration:  Good  Recall:  Good  Fund of Knowledge:Good  Language: Good  Akathisia:  Negative  Handed:  Right  AIMS (if indicated):   no abnormal or involuntary movements noted or reported   Assets:  Communication Skills Desire for Improvement Social Support  Sleep:  Number of Hours: 5.5  Cognition: WNL  ADL's:  Intact   Mental Status Per Nursing  Assessment::   On Admission:  Suicidal ideation indicated by patient, Suicide plan, Plan includes specific time, place, or method, Belief that plan would result in death  Demographic Factors:  Married, has two daughters. Lives with husband and her daughters   Loss Factors: Reports prior episode of bronchitis preceding admission, insurance constraints , disability  Historical Factors: History of bipolar disorder, history of prior psychiatric admissions   Risk Reduction Factors:   Responsible for children under 73 years of age, Sense of responsibility to family, Living with another person, especially a relative, Positive social support and Positive coping skills or problem solving skills  Continued Clinical Symptoms:  Alert, attentive, well related, mood improved and states she is feeling a lot better than on admission, affect more reactive, smiles appropriately during session, no thought disorder, no suicidal or self injurious ideations at this time , no homicidal or violent ideations, no hallucinations, no delusions , not internally preoccupied . *Patient reports history of chronic, intermittent thoughts of self cutting as a way of addressing negative affects but denies self injurious ideations at this time and presents future oriented .She also denies any suicidal ideations and currently presents future oriented . Denies medication side effects, which we have reviewed, including symptoms of Li toxicity, risk of akathisia, metabolic disorder on Abilify, and risk of severe rash on Lamictal . Behavior on unit in good control  Cognitive Features That Contribute To Risk:  No gross cognitive deficits noted upon discharge. Is alert , attentive, and oriented x 3  Suicide Risk:  Mild:  Suicidal ideation of limited frequency, intensity, duration, and specificity.  There are no identifiable plans, no associated intent, mild dysphoria and related symptoms, good self-control (both objective and  subjective assessment), few other risk factors, and identifiable protective factors, including available and accessible social support.  Follow-up Information    [name], [name], MD. Schedule an appointment as soon as possible for a visit on 10/27/2018.   Specialty:  Psychiatry Why:  Medication Management Appointment with [name] 10/27/18. Please bring your current medications and discharge information from this hospitalization with you. Contact information: [address] [place] [address] [phone]           Plan Of Care/Follow-up recommendations:  Activity:  as tolerated  Diet:  heart healthy Tests:  NA Other:  See below Patient expresses improvement and reports  readiness for discharge today, and is leaving unit in good spirits . Plans to return home Plans to follow up at [place], with [name], and has an established PCP.  She has also been  referred to IOP. Has an established PCP, Dr. Martyn Ehrich, at [facility] .  Jenne Campus, MD 09/12/2018, 8:44 AM

## 2018-09-15 ENCOUNTER — Encounter (HOSPITAL_COMMUNITY): Payer: Self-pay | Admitting: Licensed Clinical Social Worker

## 2018-09-15 ENCOUNTER — Ambulatory Visit (INDEPENDENT_AMBULATORY_CARE_PROVIDER_SITE_OTHER): Payer: Medicare Other | Admitting: Licensed Clinical Social Worker

## 2018-09-15 ENCOUNTER — Other Ambulatory Visit (HOSPITAL_COMMUNITY): Payer: Medicare Other | Attending: Psychiatry | Admitting: Licensed Clinical Social Worker

## 2018-09-15 DIAGNOSIS — J45909 Unspecified asthma, uncomplicated: Secondary | ICD-10-CM | POA: Insufficient documentation

## 2018-09-15 DIAGNOSIS — F411 Generalized anxiety disorder: Secondary | ICD-10-CM | POA: Insufficient documentation

## 2018-09-15 DIAGNOSIS — Z880 Allergy status to penicillin: Secondary | ICD-10-CM | POA: Insufficient documentation

## 2018-09-15 DIAGNOSIS — F314 Bipolar disorder, current episode depressed, severe, without psychotic features: Secondary | ICD-10-CM

## 2018-09-15 DIAGNOSIS — Z818 Family history of other mental and behavioral disorders: Secondary | ICD-10-CM | POA: Insufficient documentation

## 2018-09-15 DIAGNOSIS — M1990 Unspecified osteoarthritis, unspecified site: Secondary | ICD-10-CM | POA: Insufficient documentation

## 2018-09-15 DIAGNOSIS — F070 Personality change due to known physiological condition: Secondary | ICD-10-CM | POA: Insufficient documentation

## 2018-09-15 DIAGNOSIS — Z79899 Other long term (current) drug therapy: Secondary | ICD-10-CM | POA: Diagnosis not present

## 2018-09-15 DIAGNOSIS — E282 Polycystic ovarian syndrome: Secondary | ICD-10-CM | POA: Insufficient documentation

## 2018-09-15 NOTE — Progress Notes (Signed)
   THERAPIST PROGRESS NOTE  Session Time: 9:10-10am  Participation Level: Active  Behavioral Response: CasualAlert/Depressed/Anxious  Type of Therapy: Individual Therapy  Treatment Goals addressed: Improve psychiatric symptoms, Controlled Behavior, Moderated Mood, Improve Unhelpful Thought Patterns, Emotional Regulation Skills (Moderate moods, anger management, stress management), Feel and express a full Range of Emotions, Learn about Diagnosis, Healthy Coping Skills.   Interventions:  CBT  Summary: Christine [name] is a 43 y.o. female who presents for her individual counseling session since her discharge from [facility] Saturday. Discussed with pt her current psychiatric symptoms. Pt presented mildly depressed and anxious. She did not have a good experience at [facility], due to lack of staff available to conduct groups during the day. She had a lot of down time spent in her own thoughts and talking to other patients. She feels at a loss of what to do when she starts having SI again. Pt is interested in returning to [facility] and made intake appt today to begin PHP. Discussed with pt the necessity to have another therapist due to her insurance, Medicare. Discussed with pt crisis intervention plan to be completed by she and her husband so that she doesn't get to the same place as before her admittance to [facility]. Gave pt template for plan.   Suicidal/Homicidal: Nowithout intent/plan  Therapist Response: Assessed pt's current function by self report and reviewed progress. Assisted pt processing  [facility] experience, PHP, crisis intervention plan, referral to another counselor. Assisted pt processing for the management of her stressors.   Plan: PHP and referral to another individual counselor.  Diagnosis: Axis I:  Bipolar 1 Disorder, depressed severee    MACKENZIE,LISBETH S, LCAS 09/15/2018

## 2018-09-16 ENCOUNTER — Encounter (HOSPITAL_COMMUNITY): Payer: Self-pay | Admitting: Professional

## 2018-09-16 ENCOUNTER — Other Ambulatory Visit (HOSPITAL_COMMUNITY): Payer: Medicare Other | Admitting: Licensed Clinical Social Worker

## 2018-09-16 VITALS — BP 118/76 | HR 100 | Ht 64.5 in | Wt 347.0 lb

## 2018-09-16 DIAGNOSIS — F314 Bipolar disorder, current episode depressed, severe, without psychotic features: Secondary | ICD-10-CM

## 2018-09-16 DIAGNOSIS — F411 Generalized anxiety disorder: Secondary | ICD-10-CM | POA: Diagnosis not present

## 2018-09-16 DIAGNOSIS — M1990 Unspecified osteoarthritis, unspecified site: Secondary | ICD-10-CM | POA: Diagnosis not present

## 2018-09-16 DIAGNOSIS — F070 Personality change due to known physiological condition: Secondary | ICD-10-CM | POA: Diagnosis not present

## 2018-09-16 DIAGNOSIS — J45909 Unspecified asthma, uncomplicated: Secondary | ICD-10-CM | POA: Diagnosis not present

## 2018-09-16 DIAGNOSIS — E282 Polycystic ovarian syndrome: Secondary | ICD-10-CM | POA: Diagnosis not present

## 2018-09-16 DIAGNOSIS — Z818 Family history of other mental and behavioral disorders: Secondary | ICD-10-CM | POA: Diagnosis not present

## 2018-09-16 DIAGNOSIS — Z79899 Other long term (current) drug therapy: Secondary | ICD-10-CM | POA: Diagnosis not present

## 2018-09-16 DIAGNOSIS — Z880 Allergy status to penicillin: Secondary | ICD-10-CM | POA: Diagnosis not present

## 2018-09-16 NOTE — Progress Notes (Signed)
Patient presented with sad affect, depressed mood and denies any current suicidal or homicidal ideations, no plan or intent to want to harm self or others.  Patient reported she became more depressed over the past few months due to multiple stressors, lost her mother in October, financial strains, had problems getting her disability, was sick all through January with bronchitis and that these took a tool on her until the point she actually started planning out how she would carry out harming herself.  Again, denies any of this at present but agrees she needs to return to [facility] for group therapy and continued medication management.  Patient with oxygen level of 92 today and some congestion.  States she had to use her Albuterol inhaler and will follow up with provider if congestion continues as states was on 2 rounds of antibiotics in January for bronchitis.  Patient denied any thoughts of wanting to harm self at this time and agrees with plan to restart PHP. Patient agreed to keep this nurse or PHP staff informed of any worsening symptoms or problems with medications.  Patient rated her depression a 4, anxiety a 3 and hopelessness a 2 on a scale of 0-10 with 0 being none and 10 the worst she could manage.  Patient scored a 16 on her PHQ9 depression screening today for the past 2 weeks.
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
## 2018-09-17 ENCOUNTER — Other Ambulatory Visit: Payer: Self-pay

## 2018-09-17 ENCOUNTER — Other Ambulatory Visit (HOSPITAL_COMMUNITY): Payer: Medicare Other | Admitting: Licensed Clinical Social Worker

## 2018-09-17 ENCOUNTER — Encounter (HOSPITAL_COMMUNITY): Payer: Self-pay | Admitting: Occupational Therapy

## 2018-09-17 ENCOUNTER — Other Ambulatory Visit (HOSPITAL_COMMUNITY): Payer: Medicare Other | Admitting: Occupational Therapy

## 2018-09-17 DIAGNOSIS — R4589 Other symptoms and signs involving emotional state: Secondary | ICD-10-CM

## 2018-09-17 DIAGNOSIS — F314 Bipolar disorder, current episode depressed, severe, without psychotic features: Secondary | ICD-10-CM

## 2018-09-17 DIAGNOSIS — F411 Generalized anxiety disorder: Secondary | ICD-10-CM | POA: Diagnosis not present

## 2018-09-17 DIAGNOSIS — F070 Personality change due to known physiological condition: Secondary | ICD-10-CM | POA: Diagnosis not present

## 2018-09-17 DIAGNOSIS — Z79899 Other long term (current) drug therapy: Secondary | ICD-10-CM | POA: Diagnosis not present

## 2018-09-17 DIAGNOSIS — E282 Polycystic ovarian syndrome: Secondary | ICD-10-CM | POA: Diagnosis not present

## 2018-09-17 DIAGNOSIS — Z880 Allergy status to penicillin: Secondary | ICD-10-CM | POA: Diagnosis not present

## 2018-09-17 NOTE — Progress Notes (Signed)
Met with patient with Dr. Dwyane Dee as she reviewed all medications and discussed PHP.  Patient with no current suicidal or homicidal ideations, no plan or intent to want to harm self or others at this time.  Patient agreed to try steam and vicks vapor rub for congestion and to keep PHP provider if problems persist.  Patient agreed with plan to take Hydroxyzine 25 mg, 2 at bedtime and denied any current side effects or problems with current medication regimen.

## 2018-09-17 NOTE — Therapy (Signed)
[address] [place] [place], [place], [address] Phone: [phone]   Fax:  [phone]  Occupational Therapy Evaluation  Patient Details  Name: Christine [name] MRN: [id number] Date of Birth: 11-01-1975 Referring Provider (OT): Maudry Mayhew   Encounter Date: 09/17/2018  OT End of Session - 09/17/18 0920    Visit Number  1    Number of Visits  16    Date for OT Re-Evaluation  10/15/18    Authorization Type  BCBS    Activity Tolerance  Patient tolerated treatment well    Behavior During Therapy  [facility] for tasks assessed/performed       Past Medical History:  Diagnosis Date  . Anemia   . Anxiety   . Arthritis    Hands, ankles  . Bipolar 1 disorder ([place])   . Depression   . Fibroids   . Headache   . High blood pressure   . History of blood transfusion 08/2017   [facility]  . History of hiatal hernia   . Mental disorder   . PCOS (polycystic ovarian syndrome)   . Seasonal allergies     Past Surgical History:  Procedure Laterality Date  . CERVICAL POLYPECTOMY  01/15/2018   Procedure: CERVICAL POLYPECTOMY;  Surgeon: Arvella Nigh, MD;  Location: [facility];  Service: Gynecology;;  . COLONOSCOPY  2008   Normal  . DIAGNOSTIC LAPAROSCOPY  08/1999   dermoid cyst, [facility]  . DILATION AND CURETTAGE OF UTERUS  05/2004   [facility]  . DILATION AND CURETTAGE OF UTERUS  04/2015  . HYSTEROSCOPY W/D&C  07/22/2011   Procedure: DILATATION AND CURETTAGE /HYSTEROSCOPY;  Surgeon: Cyril Mourning, MD;  Location: [facility];  Service: Gynecology;;  . HYSTEROSCOPY W/D&C N/A 01/15/2018   Procedure: DILATATION AND CURETTAGE [place] WITH MYOSURE;  Surgeon: Arvella Nigh, MD;  Location: [facility];  Service: Gynecology;  Laterality: N/A;    There were no vitals filed for this visit.  Subjective Assessment - 09/17/18 0915    Currently in Pain?  No/denies        [facility] OT Assessment - 09/17/18 0001      Assessment   Medical Diagnosis  Bipolar  1 disorder, current episode depressed, severe    Referring Provider (OT)  Olgierd Pucilowski    Onset Date/Surgical Date  09/17/18      Precautions   Precautions  None      Restrictions   Weight Bearing Restrictions  No      Balance Screen   Has the patient fallen in the past 6 months  No    Has the patient had a decrease in activity level because of a fear of falling?   No    Is the patient reluctant to leave their home because of a fear of falling?   No        OT assessment: OCAIRS  Diagnosis: Bipolar 1 disorder, current episode depressed, severe  Past medical history/referral information: Pt presents to [facility] after a recent [facility] stay. She is known to this PHP for having 2 previous admissions in the past 2 years.  Subjective: Pt presents to evaluation casually groomed, with blunted affect and intermittently tearful.   Living situation: Pt lives with husband and 2 daughters  ADLs/IADLS: Pt reports she is at baseline with personal hygiene and appetite. 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  Social support: Pt shares that her husband/dts are supportive but that she is not that close with her other immediate family. She also shares that her friends are not local, and she only engages via social media/phone. She shares that it is difficult for her to engage in social participation and that she would like to improve this.  Struggles: sleep, social, leisure, finding purpose in a role  [place] Summary of Client Scores:  FACILITATES PARTICIPATION IN OCCUPATION  ALLOWS PARTICIPATION IN OCCUPATION INHIBITS PARTICIPATION IN OCCUPATION RESTRICTS  PARTICIPATION IN OCCUPATION COMMENTS  ROLES                  X  Plays role as mother, alienated from other roles  HABITS                  X  Pt with limited productive routines. Mentions having little purpose or things to do to fill her day  PERSONAL CAUSATION                 X   Mentions >3 areas of pride/doing well  VALUES                  X  Mentions clear values, but is not currently following  INTERESTS                  X  Limited at this time, would like to expand  SKILLS                  X  Difficulty concentrating and making decisions  SHORT TERM GOALS                                 X  Shares she has set, and working on some from in the hospital but did not clearly share and states she tries ot set goals but does not achieve them  LONG TERM GOALS                  X    INTERPETATION OF PAST EXPERIENCES                 X   Equal emphasis on best/worst  PHYSICAL ENVIRONMENT                  X  Mentions completing, but with more difficulty  SOCIAL ENVIRONMENT                  X  Isolating, has difficulty leaving the house. Not close with immediate family. No significant local friends  READINESS FOR CHANGE                  X  Difficulty adapting as of late    Need for Occupational Therapy:  4 Shows positive occupational participation, no need for OT.   3 Need for minimal intervention/consultative participation    X 2 Need for OT intervention indicated to restore/improve participation   1 Need for extensive OT intervention indicated to improve participation.  Referral for follow up services also recommended.    Assessment:  Patient demonstrates behavior that inhibits participation in occupation.  Patient will benefit from occupational therapy  intervention in order to improve time management, financial management, stress management, job readiness skills, social skills, and health management skills in preparation to return to full time community living and to be a productive community member.     Plan:  Patient will participate in skilled occupational therapy sessions individually or in a group setting to improve coping skills, psychosocial skills, and emotional skills required to return to prior level of function.  Treatment will be 4 times per week for 4 weeks.         S: "I have a lot of cooking gear, I just do not use it"  O: Education given on time management strategies in relation to organizational skills and routine building to improve BADL/IADL functioning prior to reintegrating into the community. Areas such as meal preparation, home maintaining, medication management, and child rearing discussed. Pt asked to share current time management strategies used. Education then given on daily time management recommendations that apply to BADL/IADL. Education and specific steps given to help guide pt in creating new routines. Pt to share one new strategy they would like to implement.  A: Pt presents to group with blunted affect, engaged and participatory throughout entirety of session. Pt shares that she knows meal preparation is important and something she is interested in, but she is not currently engaging in. Pt in understanding of education given this date. After education she shares that she would like to implement to do lists, using routine, laying out clothes, and meal preparation.   P: OT will follow up on goals set this date, OT treatment will be 4x per week.           OT Short Term Goals - 09/17/18 0928      OT SHORT TERM GOAL #1   Title  Patient will be educated on strategies to improve psychosocial skills needed to participate fully in all daily, work, and leisure activities.    Time  4    Period  Weeks    Status  New    Target Date  10/15/18      OT SHORT TERM GOAL #2   Title  Pt will apply psychosocial skills and coping mechanisms to daily activities in order to function independently and reintegrate into community dwelling    Time  4    Period  Weeks     Status  New    Target Date  10/15/18      OT SHORT TERM GOAL #3   Title  Pt will recall and/or apply 1-3 sleep hygiene strategies to improve BADL routine upon reintegrating into community    Time  4    Period  Weeks    Status  New    Target Date  10/15/18      OT SHORT TERM GOAL #4   Title  Pt will choose and/or engage in 1-3 socially engaging leisure activities to improve social participation skills upon reintegrating into community    Time  4    Period  Weeks    Status  New    Target Date  10/15/18      OT SHORT TERM GOAL #5   Title  Pt will engage in goal setting to improve functional BADL/IADL routine upon reintegrating into community    Time  4    Period  Weeks    Status  New    Target Date  10/15/18               Plan - 09/17/18  0920    Occupational performance deficits (Please refer to evaluation for details):  ADL's;IADL's;Rest and Sleep;Leisure;Social Participation    Rehab Potential  Good    OT Frequency  4x / week    OT Duration  4 weeks    OT Treatment/Interventions  Coping strategies training;Psychosocial skills training;Self-care/ADL training;Other (comment)   community reintegration   Consulted and Agree with Plan of Care  Patient       Patient will benefit from skilled therapeutic intervention in order to improve the following deficits and impairments:  Decreased coping skills, Decreased psychosocial skills, Other (comment)(decreased ability to engage in BADL and reintegrate into community)  Visit Diagnosis: Organic personality disorder  Difficulty coping    Problem List Patient Active Problem List   Diagnosis Date Noted  . Bipolar 1 disorder, depressed, severe ([place]) 09/08/2018  . Endometrium, polyp 01/15/2018    Class: Present on Admission  . Fibroids, submucosal 01/15/2018    Class: Present on Admission  . Iron deficiency anemia due to chronic blood loss 09/16/2017  . Menorrhagia with regular cycle   . Symptomatic anemia 08/28/2017   . Acute blood loss anemia 08/28/2017  . Bipolar disorder ([place]) 08/28/2017  . Anxiety 08/28/2017  . Depression 08/28/2017  . Fibroids 08/28/2017  . PCOS (polycystic ovarian syndrome) 08/28/2017  . Fever 08/28/2017  . HTN (hypertension) 08/28/2017  . Morbid obesity ([place]) 12/04/2015  . Pregnancy with history of uterine myomectomy 04/07/2015  . History of right oophorectomy 02/21/2015  . History of recurrent miscarriages 02/01/2015  . Vitamin D deficiency 02/01/2015   Zenovia Jarred, MSOT, OTR/L Behavioral Health OT/ Acute Relief OT PHP Office: [phone]  Zenovia Jarred 09/17/2018, 9:38 AM  [facility] HOSPITALIZATION PROGRAM [place], [place], [address] Phone: [phone]   Fax:  [phone]  Name: Christine [name] MRN: [id number] Date of Birth: 07/10/1976

## 2018-09-18 ENCOUNTER — Other Ambulatory Visit (HOSPITAL_COMMUNITY): Payer: Medicare Other | Admitting: Occupational Therapy

## 2018-09-18 ENCOUNTER — Encounter (HOSPITAL_COMMUNITY): Payer: Self-pay

## 2018-09-18 ENCOUNTER — Other Ambulatory Visit (HOSPITAL_COMMUNITY): Payer: Medicare Other | Admitting: Licensed Clinical Social Worker

## 2018-09-18 DIAGNOSIS — F070 Personality change due to known physiological condition: Secondary | ICD-10-CM | POA: Diagnosis not present

## 2018-09-18 DIAGNOSIS — F314 Bipolar disorder, current episode depressed, severe, without psychotic features: Secondary | ICD-10-CM | POA: Diagnosis not present

## 2018-09-18 DIAGNOSIS — Z880 Allergy status to penicillin: Secondary | ICD-10-CM | POA: Diagnosis not present

## 2018-09-18 DIAGNOSIS — Z79899 Other long term (current) drug therapy: Secondary | ICD-10-CM | POA: Diagnosis not present

## 2018-09-18 DIAGNOSIS — J9801 Acute bronchospasm: Secondary | ICD-10-CM | POA: Diagnosis not present

## 2018-09-18 DIAGNOSIS — F411 Generalized anxiety disorder: Secondary | ICD-10-CM | POA: Diagnosis not present

## 2018-09-18 DIAGNOSIS — J209 Acute bronchitis, unspecified: Secondary | ICD-10-CM | POA: Diagnosis not present

## 2018-09-18 DIAGNOSIS — E282 Polycystic ovarian syndrome: Secondary | ICD-10-CM | POA: Diagnosis not present

## 2018-09-18 DIAGNOSIS — R4589 Other symptoms and signs involving emotional state: Secondary | ICD-10-CM

## 2018-09-18 NOTE — Therapy (Signed)
[place], [place], [address] Phone: [phone]   Fax:  [phone]  Occupational Therapy Treatment  Patient Details  Name: Christine [name] MRN: [id number] Date of Birth: Nov 04, 1975 Referring Provider (OT): Maudry Mayhew   Encounter Date: 09/18/2018  OT End of Session - 09/18/18 1209    Visit Number  2    Number of Visits  16    Date for OT Re-Evaluation  10/15/18    Authorization Type  BCBS    OT Start Time  1100    OT Stop Time  1200    OT Time Calculation (min)  60 min    Activity Tolerance  Patient tolerated treatment well    Behavior During Therapy  [facility] for tasks assessed/performed       Past Medical History:  Diagnosis Date  . Anemia   . Anxiety   . Arthritis    Hands, ankles  . Bipolar 1 disorder ([place])   . Depression   . Fibroids   . Headache   . High blood pressure   . History of blood transfusion 08/2017   [facility]  . History of hiatal hernia   . Mental disorder   . PCOS (polycystic ovarian syndrome)   . Seasonal allergies     Past Surgical History:  Procedure Laterality Date  . CERVICAL POLYPECTOMY  01/15/2018   Procedure: CERVICAL POLYPECTOMY;  Surgeon: Arvella Nigh, MD;  Location: [facility];  Service: Gynecology;;  . COLONOSCOPY  2008   Normal  . DIAGNOSTIC LAPAROSCOPY  08/1999   dermoid cyst, RSO  . DILATION AND CURETTAGE OF UTERUS  05/2004   MAB  . DILATION AND CURETTAGE OF UTERUS  04/2015  . HYSTEROSCOPY W/D&C  07/22/2011   Procedure: DILATATION AND CURETTAGE /HYSTEROSCOPY;  Surgeon: Cyril Mourning, MD;  Location: [facility];  Service: Gynecology;;  . HYSTEROSCOPY W/D&C N/A 01/15/2018   Procedure: DILATATION AND CURETTAGE Pollyann Glen WITH MYOSURE;  Surgeon: Arvella Nigh, MD;  Location: [facility];  Service: Gynecology;  Laterality: N/A;    There were no vitals filed for this visit.  Subjective Assessment - 09/18/18 1209    Currently in Pain?  No/denies        S: "I feel guilty when I spend too much time for myself"   O: Education given on self-accountability being in line with personal values and goals to maintain occupational balance in various community settings. Pt given goal identifying worksheet to list immediate, short term, medium term, and long-term goals using a SMART goal framework (specificity, meaningful, adaptive, realistic, and time bound). Goals created as guideline for pt to practice being accountable in various situations. Pt to review list of values and to base goals off of values (personal growth, work/education, leisure, and relationships). Pt completed work sheet of goals and encouraged to share goals with the group, with emphasis on immediate goal for check in with pt for next session to maintain accountability.   A: Pt presents to group with blunted affect, engaged and participatory throughout entirety of session. Pt shares that she would like to focus the majority of her goals on personal growth/leisure values. She shares that she has attempted goal setting in the past, but has had continued difficulty maintaining accountability within herself to achieve these goals. Pt completed goal setting around cooking skills. She shares that she has joined an [employer]/r[employer] class, and wants to achieve professional level cooking. 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  She mentions she has blogged before and would like to continue this. She shares her barriers are the guilt she feels. Pt further encouraged to work on desired skill building in a social environment, pt in agreement.  P: Pt provided with education on improving accountability. OT will continue to follow up with accountability and goal/value setting skills for successful implementation into daily life.                     OT Education - 09/18/18 1209    Education Details  education given on values/goal setting    Person(s) Educated  Patient    Methods   Explanation;Handout    Comprehension  Verbalized understanding       OT Short Term Goals - 09/17/18 0928      OT SHORT TERM GOAL #1   Title  Patient will be educated on strategies to improve psychosocial skills needed to participate fully in all daily, work, and leisure activities.    Time  4    Period  Weeks    Status  New    Target Date  10/15/18      OT SHORT TERM GOAL #2   Title  Pt will apply psychosocial skills and coping mechanisms to daily activities in order to function independently and reintegrate into community dwelling    Time  4    Period  Weeks    Status  New    Target Date  10/15/18      OT SHORT TERM GOAL #3   Title  Pt will recall and/or apply 1-3 sleep hygiene strategies to improve BADL routine upon reintegrating into community    Time  4    Period  Weeks    Status  New    Target Date  10/15/18      OT SHORT TERM GOAL #4   Title  Pt will choose and/or engage in 1-3 socially engaging leisure activities to improve social participation skills upon reintegrating into community    Time  4    Period  Weeks    Status  New    Target Date  10/15/18      OT SHORT TERM GOAL #5   Title  Pt will engage in goal setting to improve functional BADL/IADL routine upon reintegrating into community    Time  4    Period  Weeks    Status  New    Target Date  10/15/18               Plan - 09/18/18 1210    Occupational performance deficits (Please refer to evaluation for details):  ADL's;IADL's;Rest and Sleep;Leisure;Social Participation       Patient will benefit from skilled therapeutic intervention in order to improve the following deficits and impairments:  Decreased coping skills, Decreased psychosocial skills, Other (comment)(decreased ability to engage in BADL and reintegrate into community)  Visit Diagnosis: Organic personality disorder  Difficulty coping    Problem List Patient Active Problem List   Diagnosis Date Noted  . Bipolar 1 disorder,  depressed, severe ([place]) 09/08/2018  . Endometrium, polyp 01/15/2018    Class: Present on Admission  . Fibroids, submucosal 01/15/2018    Class: Present on Admission  . Iron deficiency anemia due to chronic blood loss 09/16/2017  . Menorrhagia with regular cycle   . Symptomatic anemia 08/28/2017  . Acute blood loss anemia 08/28/2017  . Bipolar disorder ([place]) 08/28/2017  . Anxiety 08/28/2017  . Depression 08/28/2017  .  Fibroids 08/28/2017  . PCOS (polycystic ovarian syndrome) 08/28/2017  . Fever 08/28/2017  . HTN (hypertension) 08/28/2017  . Morbid obesity ([place]) 12/04/2015  . Pregnancy with history of uterine myomectomy 04/07/2015  . History of right oophorectomy 02/21/2015  . History of recurrent miscarriages 02/01/2015  . Vitamin D deficiency 02/01/2015   Zenovia Jarred, MSOT, OTR/L Behavioral Health OT/ Acute Relief OT PHP Office: [phone]  Zenovia Jarred 09/18/2018, 12:13 PM  [facility] PARTIAL HOSPITALIZATION PROGRAM [place], [place], [address] Phone: [phone]   Fax:  [phone]  Name: Christine [name] MRN: [id number] Date of Birth: Nov 20, 1975

## 2018-09-18 NOTE — Progress Notes (Signed)
Behavioral Health Partial Program Assessment Note  Date: 09/17/2018 Name: Christine [name] MRN: [id number]  Chief Complaint: I was just discharged from inpatient at behavioral health hospital, I was admitted for having suicidal thoughts with plan to end my life.  My therapist felt I needed inpatient for stabilization and treatment  Subjective: I am still struggling with my depression and anxiety, and not having suicidal thoughts as I need to be there for my kids but I still feel overwhelmed and depressed   HPI: Patient is a 43 y.o. African American female  with bipolar disorder, most recent episode depressed, severe who presents with depression and anxiety. Patient recently discharged on 09/12/2018 from inpatient behavioral health hospital.  Patient was enrolled in partial psychiatric program on 09/16/2018  Patient reports that she continues to struggle with her mood, knows that she needs to work on her coping skills, gets overwhelmed easily, feels isolated.  Patient has that she does not have much of a social life, knows that she needs to join a church but has not done so.  Patient has that her husband is supportive and states that she loves her 2 kids.  Patient has that she is no longer having suicidal thoughts since her discharge from the hospital, feels she needs to work in a program so she is not suicidal again and has improvement in her depression and anxiety.  On a scale of 0-10, with 0 being no symptoms and 10 being the worst, patient reports that her depression is an 8 out of 10 and her anxiety is a 7 out of 10 as she worries about her not getting better.  Patient denies any symptoms of mania, any substance use.  Patient adds that her having health problems which include migraines, asthma and bronchitis contribute to her depression.  Patient reports that she has been struggling with her breathing for a few months now, wants to start feeling better.  She states that she is on medications.  In  regards to her headaches, patient reports that she does not have a headache currently, has given up caffeine recently with seems to have helped.  Patient does report that she is going to see a neurologist next week.  Primary complaints include: anxiety, concern about health problems, difficulty sleeping, feeling depressed, poor concentration, tearfulness and I don't want to go on living like this".  Onset of symptoms was gradual and several days ago with rapidly worsening course since that time. Psychosocial Stressors include the following: family, financial and health.   I have reviewed the following documentation dated 09/16/2018: past psychiatric history, past medical history and past social and family history  Complaints of Pain: nonear Past Psychiatric History:  First psychiatric contact was with [name] on 02/12/2017, Past psychiatric hospitalizations per patient report has been to the recent was from 09/08/2018 to 03/25/2019, history of past medication trials  and Therapy.Out Patient , patient follows up with Charolotte Eke for therapy and [name] for psychiatric medication management    Substance Abuse History: none Use of Alcohol: denied Use of Caffeine: other drinks few caffeine drinks a day, has decreased it and that has helped with her headaches   Past Surgical History:  Procedure Laterality Date  . CERVICAL POLYPECTOMY  01/15/2018   Procedure: CERVICAL POLYPECTOMY;  Surgeon: Arvella Nigh, MD;  Location: [facility];  Service: Gynecology;;  . COLONOSCOPY  2008   Normal  . DIAGNOSTIC LAPAROSCOPY  08/1999   dermoid cyst, RSO  . DILATION AND CURETTAGE OF UTERUS  05/2004   MAB  . DILATION AND CURETTAGE OF UTERUS  04/2015  . HYSTEROSCOPY W/D&C  07/22/2011   Procedure: DILATATION AND CURETTAGE /HYSTEROSCOPY;  Surgeon: Cyril Mourning, MD;  Location: [facility];  Service: Gynecology;;  . HYSTEROSCOPY W/D&C N/A 01/15/2018   Procedure: DILATATION AND CURETTAGE [place] WITH MYOSURE;   Surgeon: Arvella Nigh, MD;  Location: [facility];  Service: Gynecology;  Laterality: N/A;    Past Medical History:  Diagnosis Date  . Anemia   . Anxiety   . Arthritis    Hands, ankles  . Bipolar 1 disorder ([place])   . Depression   . Fibroids   . Headache   . High blood pressure   . History of blood transfusion 08/2017   [facility]  . History of hiatal hernia   . Mental disorder   . PCOS (polycystic ovarian syndrome)   . Seasonal allergies    Outpatient Encounter Medications as of 09/18/2018  Medication Sig Note  . acetaminophen (TYLENOL) 325 MG tablet Take 2 tablets (650 mg total) by mouth every 6 (six) hours as needed for mild pain or moderate pain. (May buy over the counter)   . Albuterol Sulfate (PROAIR HFA IN) Inhale into the lungs every 6 (six) hours as needed. 1 puff every 6 hours PRN   . amLODipine (NORVASC) 10 MG tablet Take 1 tablet (10 mg total) by mouth daily.   . ARIPiprazole (ABILIFY) 10 MG tablet Take 1 tablet (10 mg total) by mouth daily. For mood   . hydrOXYzine (ATARAX/VISTARIL) 25 MG tablet Take 1 tablet (25 mg total) by mouth 3 (three) times daily as needed for anxiety. 09/17/2018: Instructed to take 2 at night (50 mg total) by Dr. Dwyane Dee 09/17/2018  . lamoTRIgine (LAMICTAL) 150 MG tablet Take 1 tablet (150 mg total) by mouth 2 (two) times daily. For mood   . lithium 600 MG capsule Take 1 capsule (600 mg total) by mouth 2 (two) times daily with a meal. For mood   . Melatonin 3 MG TABS Take 1 tablet (3 mg total) by mouth at bedtime as needed (sleep).   . metoprolol succinate (TOPROL-XL) 50 MG 24 hr tablet Take 50 mg by mouth daily. Take with or immediately following a meal.   . naproxen sodium (ALEVE) 220 MG tablet Take 440 mg by mouth daily as needed (pain).   [name] oseltamivir (TAMIFLU) 75 MG capsule Take 1 capsule (75 mg total) by mouth daily. For flu prevention    No facility-administered encounter medications on file as of 09/18/2018.    Allergies  Allergen Reactions  .  Penicillins Rash and Other (See Comments)    Has patient had a PCN reaction causing immediate rash, facial/tongue/throat swelling, SOB or lightheadedness with hypotension: Yes Has patient had a PCN reaction causing severe rash involving mucus membranes or skin necrosis: Unknown Has patient had a PCN reaction that required hospitalization: No Has patient had a PCN reaction occurring within the last 10 years: No If all of the above answers are "NO", then may proceed with Cephalosporin use.     Social History   Tobacco Use  . Smoking status: Never Smoker  . Smokeless tobacco: Never Used  Substance Use Topics  . Alcohol use: Yes    Comment: occasional   Functioning Relationships: good relationship with spouse or significant other, alone & isolated and good relationship with children  Other Pertinent History: None Family History  Problem Relation Age of Onset  . Anxiety disorder Mother   .  Depression Mother   . Cancer Father        colon  . Cancer Brother 6       leukemia, childhood     Review of Systems Constitutional: positive for fatigue Eyes: negative Ears, nose, mouth, throat, and face: negative for hearing loss, nasal congestion, sore throat and hoarseness Respiratory: positive for cough or wheezing Cardiovascular: negative for chest pain, palpitations, syncope Musculoskeletal: positive for myalgias Neurological: positive for headaches Behavioral/Psych: positive for anxiety and depression  Objective:  There were no vitals filed for this visit.  Physical Exam: No exam performed today, no exam necessary.  Mental Status Exam: Appearance:  Casually dressed Psychomotor::  Psychomotor Retardation Attention span and concentration: Normal Behavior: calm, cooperative and adequate rapport can be established Speech:  soft Mood:  depressed and anxious Affect:  constricted and mood-congruent Thought Process:  Coherent, Goal Directed and Descriptions of Associations:  Intact Thought Content:  Logical, Hallucinations: None and Rumination Orientation:  person, place, time/date, situation, day of week and month of year Cognition:  grossly intact Insight:  Fair to poor Judgment:  Poor to fair Estimate of Intelligence: Average Fund of knowledge: Aware of current events and Intact Memory: Recent and remote intact Abnormal movements: None Gait and station: Normal  Assessment:  Diagnosis: No primary diagnosis found. 1. Bipolar 1 disorder, depressed, severe ([place])   2. Generalized anxiety disorder     Indications for admission: inpatient care required if not in partial hospital program  Plan: patient enrolled in Partial Hospitalization Program, patient's current medications are to be continued, a comprehensive treatment plan will be developed and side effects of medications have been reviewed with patient  Treatment options and alternatives reviewed with patient and patient understands the above plan.   Comments: Medication changes made as listed above in the list of medications.  Crisis and safety plan discussed in length with patient, patient denies any suicidal ideation, has a supportive spouse.    Hampton Abbot, MD

## 2018-09-21 ENCOUNTER — Encounter (HOSPITAL_COMMUNITY): Payer: Self-pay

## 2018-09-21 ENCOUNTER — Encounter: Payer: Self-pay | Admitting: Neurology

## 2018-09-21 ENCOUNTER — Encounter: Payer: Self-pay | Admitting: *Deleted

## 2018-09-21 ENCOUNTER — Other Ambulatory Visit (HOSPITAL_COMMUNITY): Payer: Medicare Other | Admitting: Licensed Clinical Social Worker

## 2018-09-21 ENCOUNTER — Other Ambulatory Visit (HOSPITAL_COMMUNITY): Payer: Medicare Other | Admitting: Occupational Therapy

## 2018-09-21 ENCOUNTER — Ambulatory Visit (INDEPENDENT_AMBULATORY_CARE_PROVIDER_SITE_OTHER): Payer: Medicare Other | Admitting: Neurology

## 2018-09-21 VITALS — BP 133/74 | HR 102 | Ht 65.0 in | Wt 350.0 lb

## 2018-09-21 DIAGNOSIS — G43709 Chronic migraine without aura, not intractable, without status migrainosus: Secondary | ICD-10-CM | POA: Diagnosis not present

## 2018-09-21 DIAGNOSIS — H539 Unspecified visual disturbance: Secondary | ICD-10-CM

## 2018-09-21 DIAGNOSIS — Z880 Allergy status to penicillin: Secondary | ICD-10-CM | POA: Diagnosis not present

## 2018-09-21 DIAGNOSIS — R400 Somnolence: Secondary | ICD-10-CM | POA: Diagnosis not present

## 2018-09-21 DIAGNOSIS — Z79899 Other long term (current) drug therapy: Secondary | ICD-10-CM | POA: Diagnosis not present

## 2018-09-21 DIAGNOSIS — G4484 Primary exertional headache: Secondary | ICD-10-CM

## 2018-09-21 DIAGNOSIS — R51 Headache with orthostatic component, not elsewhere classified: Secondary | ICD-10-CM

## 2018-09-21 DIAGNOSIS — R519 Headache, unspecified: Secondary | ICD-10-CM

## 2018-09-21 DIAGNOSIS — H532 Diplopia: Secondary | ICD-10-CM

## 2018-09-21 DIAGNOSIS — R0683 Snoring: Secondary | ICD-10-CM | POA: Diagnosis not present

## 2018-09-21 DIAGNOSIS — I671 Cerebral aneurysm, nonruptured: Secondary | ICD-10-CM | POA: Diagnosis not present

## 2018-09-21 DIAGNOSIS — F314 Bipolar disorder, current episode depressed, severe, without psychotic features: Secondary | ICD-10-CM

## 2018-09-21 DIAGNOSIS — F411 Generalized anxiety disorder: Secondary | ICD-10-CM | POA: Diagnosis not present

## 2018-09-21 DIAGNOSIS — F070 Personality change due to known physiological condition: Secondary | ICD-10-CM

## 2018-09-21 DIAGNOSIS — R4589 Other symptoms and signs involving emotional state: Secondary | ICD-10-CM

## 2018-09-21 DIAGNOSIS — E282 Polycystic ovarian syndrome: Secondary | ICD-10-CM | POA: Diagnosis not present

## 2018-09-21 MED ORDER — ERENUMAB-AOOE 140 MG/ML ~~LOC~~ SOAJ: 140.0000 mg | SUBCUTANEOUS | 11 refills | Status: DC

## 2018-09-21 MED ORDER — ONDANSETRON 4 MG PO TBDP: 4.0000 mg | ORAL_TABLET | Freq: Three times a day (TID) | ORAL | 3 refills | Status: DC | PRN

## 2018-09-21 MED ORDER — RIZATRIPTAN BENZOATE 10 MG PO TBDP: 10.0000 mg | ORAL_TABLET | ORAL | 11 refills | Status: DC | PRN

## 2018-09-21 NOTE — Therapy (Signed)
[place], [place], [address] Phone: [phone]   Fax:  [phone]  Occupational Therapy Treatment  Patient Details  Name: Christine [name] MRN: [id number] Date of Birth: 04-14-1976 Referring Provider (OT): Maudry Mayhew   Encounter Date: 09/21/2018  OT End of Session - 09/21/18 1354    Visit Number  3    Number of Visits  16    Date for OT Re-Evaluation  10/15/18    Authorization Type  BCBS    OT Start Time  1100    OT Stop Time  1200    OT Time Calculation (min)  60 min    Activity Tolerance  Patient tolerated treatment well    Behavior During Therapy  [facility] for tasks assessed/performed       Past Medical History:  Diagnosis Date  . Anemia   . Anxiety   . Arthritis    Hands, ankles  . Bipolar 1 disorder ([place])   . Depression   . Depression   . Fibroids   . Headache   . High blood pressure   . History of blood transfusion 08/2017   [facility]  . History of hiatal hernia   . Mental disorder   . Migraines   . PCOS (polycystic ovarian syndrome)   . Seasonal allergies     Past Surgical History:  Procedure Laterality Date  . CERVICAL POLYPECTOMY  01/15/2018   Procedure: CERVICAL POLYPECTOMY;  Surgeon: Arvella Nigh, MD;  Location: [facility];  Service: Gynecology;;  . COLONOSCOPY  2008   Normal  . DIAGNOSTIC LAPAROSCOPY  08/1999   dermoid cyst, RSO  . DILATION AND CURETTAGE OF UTERUS  05/2004   MAB  . DILATION AND CURETTAGE OF UTERUS  04/2015  . HYSTEROSCOPY W/D&C  07/22/2011   Procedure: DILATATION AND CURETTAGE /HYSTEROSCOPY;  Surgeon: Cyril Mourning, MD;  Location: [facility];  Service: Gynecology;;  . HYSTEROSCOPY W/D&C N/A 01/15/2018   Procedure: DILATATION AND CURETTAGE Pol[place]ann Glen WITH MYOSURE;  Surgeon: Arvella Nigh, MD;  Location: [facility];  Service: Gynecology;  Laterality: N/A;  . 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  Subjective Assessment - 09/21/18 1354    Currently in Pain?  No/denies       S: "I know I need more routine and structure"   O: Pt educated on sleep hygiene as it pertains to daily life/routines this date. Education given on appropriate sleep routines, sleep disorders, detriments of too much/too little sleep with encouraged feedback of personal Experiences. Sleep hygiene handout given for pt to choose new areas for implementation in BADL routine. Further education given on relaxation techniques to implement before bed. Pt asked to identify one STG in relation to sleep hygiene to create better daily sleep habits.  ?  A: Pt presents to group with blunted affect, drowsy this date, only reporting 3 hours of sleep from previous night. Pt in understanding of sleep hygiene education given. She shares that she does better with routine/structure and would like to implement this more consistently. She shares that this date she is willing to commit to making a bedtime routine and relaxation strategies to improve sleep hygiene. ?  P: Pt provided with skills to increase sleep hygiene habits into daily routine. OT will continue to follow up with communication skills for successful implementation into daily life.  OT Education - 09/21/18 1354    Education Details  education given on sleep hygiene    Person(s) Educated  Patient    Methods  Explanation;Handout    Comprehension  Verbalized understanding       OT Short Term Goals - 09/21/18 1359      OT SHORT TERM GOAL #1   Title  Patient will be educated on strategies to improve psychosocial skills needed to participate fully in all daily, work, and leisure activities.    Time  4    Period  Weeks    Status  On-going    Target Date  10/15/18      OT SHORT TERM GOAL #2   Title  Pt will apply psychosocial skills and coping mechanisms to daily activities in order to function independently and reintegrate into  community dwelling    Time  4    Period  Weeks    Status  On-going    Target Date  10/15/18      OT SHORT TERM GOAL #3   Title  Pt will recall and/or apply 1-3 sleep hygiene strategies to improve BADL routine upon reintegrating into community    Time  4    Period  Weeks    Status  On-going    Target Date  10/15/18      OT SHORT TERM GOAL #4   Title  Pt will choose and/or engage in 1-3 socially engaging leisure activities to improve social participation skills upon reintegrating into community    Time  4    Period  Weeks    Status  On-going      OT SHORT TERM GOAL #5   Title  Pt will engage in goal setting to improve functional BADL/IADL routine upon reintegrating into community    Time  4    Period  Weeks    Status  On-going    Target Date  10/15/18               Plan - 09/21/18 1355    Occupational performance deficits (Please refer to evaluation for details):  ADL's;IADL's;Rest and Sleep;Leisure;Social Participation       Patient will benefit from skilled therapeutic intervention in order to improve the following deficits and impairments:  Decreased coping skills, Decreased psychosocial skills, Other (comment)(decreased ability to engage in BADL and reintegrate into community)  Visit Diagnosis: Organic personality disorder  Difficulty coping    Problem List Patient Active Problem List   Diagnosis Date Noted  . Bipolar 1 disorder, depressed, severe ([place]) 09/08/2018  . Endometrium, polyp 01/15/2018    Class: Present on Admission  . Fibroids, submucosal 01/15/2018    Class: Present on Admission  . Iron deficiency anemia due to chronic blood loss 09/16/2017  . Menorrhagia with regular cycle   . Symptomatic anemia 08/28/2017  . Acute blood loss anemia 08/28/2017  . Bipolar disorder ([place]) 08/28/2017  . Anxiety 08/28/2017  . Depression 08/28/2017  . Fibroids 08/28/2017  . PCOS (polycystic ovarian syndrome) 08/28/2017  . Fever 08/28/2017  . HTN  (hypertension) 08/28/2017  . Morbid obesity ([place]) 12/04/2015  . Pregnancy with history of uterine myomectomy 04/07/2015  . History of right oophorectomy 02/21/2015  . History of recurrent miscarriages 02/01/2015  . Vitamin D deficiency 02/01/2015   Zenovia Jarred, MSOT, OTR/L Behavioral Health OT/ Acute Relief OT PHP Office: [phone]  Zenovia Jarred 09/21/2018, 2:00 PM  [place], [place],  [address] Phone: [phone]   Fax:  [phone]  Name: Christine [name] MRN: [id number] Date of Birth: [birth year]

## 2018-09-21 NOTE — Patient Instructions (Addendum)
- MRI of the brain  - Sleep evaluation - you will see our sleep doctor - Start Aimovig monthly for migraine prevention - Healthy weight and wellness center: Will refer for morbid obesity - Acute management: When you have a migraine try Rizatriptan: Please take one tablet at the onset of your headache. If it does not improve the symptoms please take one additional tablet. Do not take more then 2 tablets in 24hrs. Do not take use more then 2 to 3 times in a week. May take wit Alleve. - Ondansetron for nausea, may also take with Maxalt  Erenumab injection What is this medicine? ERENUMAB (e REN ue mab) is used to prevent migraine headaches. This medicine may be used for other purposes; ask your health care provider or pharmacist if you have questions. COMMON BRAND NAME(S): Aimovig What should I tell my health care provider before I take this medicine? They need to know if you have any of these conditions: -an unusual or allergic reaction to erenumab, latex, other medicines, foods, dyes, or preservatives -pregnant or trying to get pregnant -breast-feeding How should I use this medicine? This medicine is for injection under the skin. You will be taught how to prepare and give this medicine. Use exactly as directed. Take your medicine at regular intervals. Do not take your medicine more often than directed. It is important that you put your used needles and syringes in a special sharps container. Do not put them in a trash can. If you do not have a sharps container, call your pharmacist or healthcare provider to get one. Talk to your pediatrician regarding the use of this medicine in children. Special care may be needed. Overdosage: If you think you have taken too much of this medicine contact a poison control center or emergency room at once. NOTE: This medicine is only for you. Do not share this medicine with others. What if I miss a dose? If you miss a dose, take it as soon as you can. If it is  almost time for your next dose, take only that dose. Do not take double or extra doses. What may interact with this medicine? Interactions are not expected. This list may not describe all possible interactions. Give your health care provider a list of all the medicines, herbs, non-prescription drugs, or dietary supplements you use. Also tell them if you smoke, drink alcohol, or use illegal drugs. Some items may interact with your medicine. What should I watch for while using this medicine? Tell your doctor or healthcare professional if your symptoms do not start to get better or if they get worse. What side effects may I notice from receiving this medicine? Side effects that you should report to your doctor or health care professional as soon as possible: -allergic reactions like skin rash, itching or hives, swelling of the face, lips, or tongue Side effects that usually do not require medical attention (report these to your doctor or health care professional if they continue or are bothersome): -constipation -muscle cramps -pain, redness, or irritation at site where injected This list may not describe all possible side effects. Call your doctor for medical advice about side effects. You may report side effects to FDA at 1-800-FDA-1088. Where should I keep my medicine? Keep out of the reach of children. You will be instructed on how to store this medicine. Throw away any unused medicine after the expiration date on the label. NOTE: This sheet is a summary. It may not cover all possible  information. If you have questions about this medicine, talk to your doctor, pharmacist, or health care provider.  2019 [employer] Standard (2016-12-23 09:39:04)   Rizatriptan disintegrating tablets What is this medicine? RIZATRIPTAN (rye za TRIP tan) is used to treat migraines with or without aura. An aura is a strange feeling or visual disturbance that warns you of an attack. It is not used to prevent  migraines. This medicine may be used for other purposes; ask your health care provider or pharmacist if you have questions. COMMON BRAND NAME(S): Maxalt-MLT What should I tell my health care provider before I take this medicine? They need to know if you have any of these conditions: -cigarette smoker -circulation problems in fingers and toes -diabetes -heart disease -high blood pressure -high cholesterol -history of irregular heartbeat -history of stroke -kidney disease -liver disease -stomach or intestine problems -an unusual or allergic reaction to rizatriptan, other medicines, foods, dyes, or preservatives -pregnant or trying to get pregnant -breast-feeding How should I use this medicine? Take this medicine by mouth. Follow the directions on the prescription label. Leave the tablet in the sealed blister pack until you are ready to take it. With dry hands, open the blister and gently remove the tablet. If the tablet breaks or crumbles, throw it away and take a new tablet out of the blister pack. Place the tablet in the mouth and allow it to dissolve, and then swallow. Do not cut, crush, or chew this medicine. You do not need water to take this medicine. Do not take it more often than directed. Talk to your pediatrician regarding the use of this medicine in children. While this drug may be prescribed for children as young as 6 years for selected conditions, precautions do apply. Overdosage: If you think you have taken too much of this medicine contact a poison control center or emergency room at once. NOTE: This medicine is only for you. Do not share this medicine with others. What if I miss a dose? This does not apply. This medicine is not for regular use. What may interact with this medicine? Do not take this medicine with any of the following medicines: -certain medicines for migraine headache like almotriptan, eletriptan, frovatriptan, naratriptan, rizatriptan, sumatriptan,  zolmitriptan -ergot alkaloids like dihydroergotamine, ergonovine, ergotamine, methylergonovine -MAOIs like Carbex, Eldepryl, Marplan, Nardil, and Parnate This medicine may also interact with the following medications: -certain medicines for depression, anxiety, or psychotic disorders -propranolol This list may not describe all possible interactions. Give your health care provider a list of all the medicines, herbs, non-prescription drugs, or dietary supplements you use. Also tell them if you smoke, drink alcohol, or use illegal drugs. Some items may interact with your medicine. What should I watch for while using this medicine? Visit your healthcare professional for regular checks on your progress. Tell your healthcare professional if your symptoms do not start to get better or if they get worse. You may get drowsy or dizzy. Do not drive, use machinery, or do anything that needs mental alertness until you know how this medicine affects you. Do not stand up or sit up quickly, especially if you are an older patient. This reduces the risk of dizzy or fainting spells. Alcohol may interfere with the effect of this medicine. Your mouth may get dry. Chewing sugarless gum or sucking hard candy and drinking plenty of water may help. Contact your healthcare professional if the problem does not go away or is severe. If you take migraine medicines for 10 or  more days a month, your migraines may get worse. Keep a diary of headache days and medicine use. Contact your healthcare professional if your migraine attacks occur more frequently. What side effects may I notice from receiving this medicine? Side effects that you should report to your doctor or health care professional as soon as possible: -allergic reactions like skin rash, itching or hives, swelling of the face, lips, or tongue -chest pain or chest tightness -signs and symptoms of a dangerous change in heartbeat or heart rhythm like chest pain; dizziness;  fast, irregular heartbeat; palpitations; feeling faint or lightheaded; falls; breathing problems -signs and symptoms of a stroke like changes in vision; confusion; trouble speaking or understanding; severe headaches; sudden numbness or weakness of the face, arm or leg; trouble walking; dizziness; loss of balance or coordination -signs and symptoms of serotonin syndrome like irritable; confusion; diarrhea; fast or irregular heartbeat; muscle twitching; stiff muscles; trouble walking; sweating; high fever; seizures; chills; vomiting Side effects that usually do not require medical attention (report to your doctor or health care professional if they continue or are bothersome): -diarrhea -dizziness -drowsiness -dry mouth -headache -nausea, vomiting -pain, tingling, numbness in the hands or feet -stomach pain This list may not describe all possible side effects. Call your doctor for medical advice about side effects. You may report side effects to FDA at 1-800-FDA-1088. Where should I keep my medicine? Keep out of the reach of children. Store at room temperature between 15 and 30 degrees C (59 and 86 degrees F). Protect from light and moisture. Throw away any unused medicine after the expiration date. NOTE: This sheet is a summary. It may not cover all possible information. If you have questions about this medicine, talk to your doctor, pharmacist, or health care provider.  2019 [employer] Standard (2018-02-03 14:58:08)   Ondansetron oral dissolving tablet What is this medicine? ONDANSETRON (on DAN se tron) is used to treat nausea and vomiting caused by chemotherapy. It is also used to prevent or treat nausea and vomiting after surgery. This medicine may be used for other purposes; ask your health care provider or pharmacist if you have questions. COMMON BRAND NAME(S): Zofran ODT What should I tell my health care provider before I take this medicine? They need to know if you have any of  these conditions: -heart disease -history of irregular heartbeat -liver disease -low levels of magnesium or potassium in the blood -an unusual or allergic reaction to ondansetron, granisetron, other medicines, foods, dyes, or preservatives -pregnant or trying to get pregnant -breast-feeding How should I use this medicine? These tablets are made to dissolve in the mouth. Do not try to push the tablet through the foil backing. With dry hands, peel away the foil backing and gently remove the tablet. Place the tablet in the mouth and allow it to dissolve, then swallow. While you may take these tablets with water, it is not necessary to do so. Talk to your pediatrician regarding the use of this medicine in children. Special care may be needed. Overdosage: If you think you have taken too much of this medicine contact a poison control center or emergency room at once. NOTE: This medicine is only for you. Do not share this medicine with others. What if I miss a dose? If you miss a dose, take it as soon as you can. If it is almost time for your next dose, take only that dose. Do not take double or extra doses. What may interact with this medicine? Do  not take this medicine with any of the following medications: -apomorphine -certain medicines for fungal infections like fluconazole, itraconazole, ketoconazole, posaconazole, voriconazole -cisapride -dofetilide -dronedarone -pimozide -thioridazine -ziprasidone This medicine may also interact with the following medications: -carbamazepine -certain medicines for depression, anxiety, or psychotic disturbances -fentanyl -linezolid -MAOIs like Carbex, Eldepryl, Marplan, Nardil, and Parnate -methylene blue (injected into a vein) -other medicines that prolong the QT interval (cause an abnormal heart rhythm) -phenytoin -rifampicin -tramadol This list may not describe all possible interactions. Give your health care provider a list of all the  medicines, herbs, non-prescription drugs, or dietary supplements you use. Also tell them if you smoke, drink alcohol, or use illegal drugs. Some items may interact with your medicine. What should I watch for while using this medicine? Check with your doctor or health care professional as soon as you can if you have any sign of an allergic reaction. What side effects may I notice from receiving this medicine? Side effects that you should report to your doctor or health care professional as soon as possible: -allergic reactions like skin rash, itching or hives, swelling of the face, lips, or tongue -breathing problems -confusion -dizziness -fast or irregular heartbeat -feeling faint or lightheaded, falls -fever and chills -loss of balance or coordination -seizures -sweating -swelling of the hands and feet -tightness in the chest -tremors -unusually weak or tired Side effects that usually do not require medical attention (report to your doctor or health care professional if they continue or are bothersome): -constipation or diarrhea -headache This list may not describe all possible side effects. Call your doctor for medical advice about side effects. You may report side effects to FDA at 1-800-FDA-1088. Where should I keep my medicine? Keep out of the reach of children. Store between 2 and 30 degrees C (36 and 86 degrees F). Throw away any unused medicine after the expiration date. NOTE: This sheet is a summary. It may not cover all possible information. If you have questions about this medicine, talk to your doctor, pharmacist, or health care provider.  2019 [employer] Standard (2013-04-28 16:21:52)

## 2018-09-21 NOTE — Progress Notes (Signed)
[facility]    Provider:  Dr Jaynee Eagles Referring Provider: Scifres, Durel Salts Primary Care Provider:  Scifres, Durel Salts  CC: Migraines and headaches  HPI:  Christine [name] is a 43 y.o. female here as requested by provider Scifres, Earlie Server, PA-C for migraines.  Past medical history high blood pressure, polycystic ovarian syndrome, arthritis, bipolar disorder with 4 prior hospitalizations last February 2020, headache, anxiety, depression, morbid obesity.  She was recently discharged from a psychiatric inpatient admission September 12, 2018 and recently saw Dr. Dwyane Dee on April 14 still struggling with depression and anxiety however denied suicidal thoughts but endorsed feeling overwhelmed and depressed.  She is in a partial psychiatric program at this time.  Patient is here alone. She has a history of migraines for the last 20 years. She does not always have an aura maybe sometimes some visual changes, dizziness. Advil helps. She was averaging one migraine a month but in the last 6-8 months they are more frequent. She gets pain in the right temporal area and blurry vision/diplopia, she has a sharp burst on the right with exertion like when coughing or with valsalva which is new.lasting a few seconds with disorientation. Migraines also worsening. She tried to make changes she stopped drinking coffee and tried to cut back on diet sodas which helped. Her migraines include nausea, light and sound sensitivity, blurry vision, usually starts on the right behind the eye, pulsating, pounding and throbbing, severe. She has 16 headache days a month and 8 migraine days. They can last 24-48 hours easily. No medication overuse. She wakes with headaches and dry mouth. She snores. She is depressed and very fatigued.   Is on multiple medications that can be used for migraine management: Lamictal, Topamax, lithium, Abilify, melatonin, amlodipine, metoprolol  Reviewed notes, labs and imaging from  outside physicians, which showed:    Ct showed No acute intracranial abnormalities including mass lesion or mass effect, hydrocephalus, extra-axial fluid collection, midline shift, hemorrhage, or acute infarction, large ischemic events (personally reviewed images)   Reviewed notes in epic, psychiatric admission discharge September 12, 2018.  She has a long history of bipolar disorder, receiving disability since 2009, prior manic, hypomanic and mixed episodes.  3 prior hospitalizations.  She was most recently hospitalized for suicidal ideation due to stress(mother died, financial strains, problems with disability, bronchitis and other illnesses) and noncompliance with medications, depressed, decreased sleep, appetite, fatigue and suicidal ideation.  She made a list of methods for suicide including overdose, cutting herself and crashing her car.  She is on multiple medications including lithium, Lamictal, Topamax and Klonopin.  Headaches worsened in the setting of above history.  However she did note an appointment on September 18, 2018 that she is decreased her caffeine intake which has helped her headaches.   Review of Systems: Patient complains of symptoms per HPI as well as the following symptoms: headache, depression. Pertinent negatives and positives per HPI. All others negative.   Social History   Socioeconomic History  . Marital status: Married    Spouse name: Not on file  . Number of children: 2  . Years of education: 2 years of grad school + bach & HS  . Highest education level: Bachelor's degree (e.g., BA, AB, BS)  Occupational History  . Not on file  Social Needs  . Financial resource strain: Somewhat hard  . Food insecurity:    Worry: Sometimes true    Inability: Sometimes true  . Transportation needs:    Medical: No  Non-medical: No  Tobacco Use  . Smoking status: Never Smoker  . Smokeless tobacco: Never Used  Substance and Sexual Activity  . Alcohol use: Yes     Comment: occasional, maybe once a month or so  . Drug use: No  . Sexual activity: Yes    Partners: Male    Birth control/protection: None  Lifestyle  . Physical activity:    Days per week: 0 days    Minutes per session: Not on file  . Stress: Rather much  Relationships  . Social connections:    Talks on phone: Never    Gets together: Never    Attends religious service: Never    Active member of club or organization: No    Attends meetings of clubs or organizations: Never    Relationship status: Not on file  . Intimate partner violence:    Fear of current or ex partner: No    Emotionally abused: No    Physically abused: No    Forced sexual activity: No  Other Topics Concern  . Not on file  Social History Narrative   Lives at home with her husband and daughters   Right handed   Caffeine: decreased intake, drinks rarely     Family History  Problem Relation Age of Onset  . Anxiety disorder Mother   . Depression Mother   . Cancer Mother        pancreatic   . High blood pressure Mother   . Cancer Father        colon  . High blood pressure Father   . Diabetes Father   . Cancer Brother 6       leukemia, childhood  . High blood pressure Brother   . Migraines Neg Hx     Past Medical History:  Diagnosis Date  . Anemia   . Anxiety   . Arthritis    Hands, ankles  . Bipolar 1 disorder ([place])   . Depression   . Depression   . Fibroids   . Headache   . High blood pressure   . History of blood transfusion 08/2017   [facility]  . History of hiatal hernia   . Mental disorder   . Migraines   . PCOS (polycystic ovarian syndrome)   . Seasonal allergies     Patient Active Problem List   Diagnosis Date Noted  . Chronic migraine without aura without status migrainosus, not intractable 09/21/2018  . Bipolar 1 disorder, depressed, severe ([place]) 09/08/2018  . Endometrium, polyp 01/15/2018    Class: Present on Admission  . Fibroids, submucosal 01/15/2018    Class: Present on  Admission  . Iron deficiency anemia due to chronic blood loss 09/16/2017  . Menorrhagia with regular cycle   . Symptomatic anemia 08/28/2017  . Acute blood loss anemia 08/28/2017  . Bipolar disorder ([place]) 08/28/2017  . Anxiety 08/28/2017  . Depression 08/28/2017  . Fibroids 08/28/2017  . PCOS (polycystic ovarian syndrome) 08/28/2017  . Fever 08/28/2017  . HTN (hypertension) 08/28/2017  . Morbid obesity ([place]) 12/04/2015  . Pregnancy with history of uterine myomectomy 04/07/2015  . History of right oophorectomy 02/21/2015  . History of recurrent miscarriages 02/01/2015  . Vitamin D deficiency 02/01/2015    Past Surgical History:  Procedure Laterality Date  . CERVICAL POLYPECTOMY  01/15/2018   Procedure: CERVICAL POLYPECTOMY;  Surgeon: Arvella Nigh, MD;  Location: [facility];  Service: Gynecology;;  . COLONOSCOPY  2008   Normal  . DIAGNOSTIC LAPAROSCOPY  08/1999  dermoid cyst, RSO  . DILATION AND CURETTAGE OF UTERUS  05/2004   [facility]  . DILATION AND CURETTAGE OF UTERUS  04/2015  . HYSTEROSCOPY W/D&C  07/22/2011   Procedure: DILATATION AND CURETTAGE /HYSTEROSCOPY;  Surgeon: Cyril Mourning, MD;  Location: [facility];  Service: Gynecology;;  . HYSTEROSCOPY W/D&C N/A 01/15/2018   Procedure: DILATATION AND CURETTAGE [place] WITH MYOSURE;  Surgeon: Arvella Nigh, MD;  Location: [facility];  Service: Gynecology;  Laterality: N/A;  . 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 acetaminophen (TYLENOL) 325 MG tablet Take 2 tablets (650 mg total) by mouth every 6 (six) hours as needed for mild pain or moderate pain. (May buy over the counter) 1 tablet 0  . Albuterol Sulfate (PROAIR HFA IN) Inhale into the lungs every 6 (six) hours as needed. 1 puff every 6 hours PRN    . amLODipine (NORVASC) 10 MG tablet Take 1 tablet (10 mg total) by mouth daily. 30 tablet 0  . ARIPiprazole (ABILIFY) 10 MG tablet Take 1 tablet (10 mg total) by  mouth daily. For mood 30 tablet 1  . hydrOXYzine (ATARAX/VISTARIL) 25 MG tablet Take 1 tablet (25 mg total) by mouth 3 (three) times daily as needed for anxiety. 60 tablet 1  . lamoTRIgine (LAMICTAL) 150 MG tablet Take 1 tablet (150 mg total) by mouth 2 (two) times daily. For mood 60 tablet 1  . lithium 600 MG capsule Take 1 capsule (600 mg total) by mouth 2 (two) times daily with a meal. For mood 60 capsule 1  . Melatonin 3 MG TABS Take 1 tablet (3 mg total) by mouth at bedtime as needed (sleep). 30 tablet 1  . metoprolol succinate (TOPROL-XL) 50 MG 24 hr tablet Take 50 mg by mouth daily. Take with or immediately following a meal.    . naproxen sodium (ALEVE) 220 MG tablet Take 440 mg by mouth daily as needed (pain).    [name] (AIMOVIG) 140 MG/ML SOAJ Inject 140 mg into the skin every 30 (thirty) days. 1 pen 11  . ondansetron (ZOFRAN-ODT) 4 MG disintegrating tablet Take 1 tablet (4 mg total) by mouth every 8 (eight) hours as needed for nausea. 30 tablet 3  . rizatriptan (MAXALT-MLT) 10 MG disintegrating tablet Take 1 tablet (10 mg total) by mouth as needed for migraine. May repeat in 2 hours if needed 9 tablet 11   No current facility-administered medications for this visit.     Allergies as of 09/21/2018 - Review Complete 09/21/2018  Allergen Reaction Noted  . Penicillins Rash and Other (See Comments) 07/12/2011    Vitals: BP 133/74 (BP Location: Right Arm, Patient Position: Sitting)   Pulse (!) 102   Ht 5\' 5"  (1.651 m)   Wt (!) 350 lb (158.8 kg)   LMP 09/09/2018 (Exact Date)   SpO2 97%   BMI 58.24 kg/m  Last Weight:  Wt Readings from Last 1 Encounters:  09/21/18 (!) 350 lb (158.8 kg)   Last Height:   Ht Readings from Last 1 Encounters:  09/21/18 5\' 5"  (1.651 m)     Physical exam: Exam: Gen: NAD, conversant, well nourised, morbidly obese, well groomed                     CV: RRR, no MRG. No Carotid Bruits. No peripheral edema, warm, nontender Eyes: Conjunctivae  clear without exudates or hemorrhage  Neuro: Detailed Neurologic Exam  Speech:    Speech is normal; fluent and spontaneous with normal comprehension.  Cognition:    The patient is oriented to person, place, and time;     recent and remote memory intact;     language fluent;     normal attention, concentration,     fund of knowledge Cranial Nerves:    The pupils are equal, round, and reactive to light. The fundi are normal and spontaneous venous pulsations are present. Visual fields are full to finger confrontation. Extraocular movements are intact. Trigeminal sensation is intact and the muscles of mastication are normal. The face is symmetric. The palate elevates in the midline. Hearing intact. Voice is normal. Shoulder shrug is normal. The tongue has normal motion without fasciculations.   Coordination:    Normal finger to nose  Gait:    Wide based due to extremely large body habitus  Motor Observation:    No asymmetry, no atrophy, and no involuntary movements noted. Tone:    Normal muscle tone.    Posture:    Posture is normal. normal erect    Strength:    Strength is V/V in the upper and lower limbs.      Sensation: intact to LT     Reflex Exam:  DTR's:    Deep tendon reflexes in the upper and lower extremities are normal bilaterally.   Toes:    The toes are downgoing bilaterally.   Clonus:    Clonus is absent.    Assessment/Plan:  Patient with chronic migraines, failed multiple medications. Now with new headaches that exertional (valsalva, cough), morning headaches and positional headaches, vision changes/diplopia, needs thorough evaluation  MRI brain due to concerning symptoms of morning headaches, positional headaches,vision changes/diplopia, headache on exertion  to look for space occupying mass, chiari or intracranial hypertension (pseudotumor). Also need MRA of the head to eval for aneurysm given exertional quality/pain with cough and valsalva.  She wakes  with headaches and dry mouth. She snores. She is depressed and very fatigued. Morbid obesity. ESS 12, FSS 46. Sleep Evaluation for OSA vs hypoventilation syndrome  Start Aimovig: do not get pregnant, discussed for migraine prevention    [facility]: Will refer for morbid obesity  Orders Placed This Encounter  Procedures  . MR BRAIN W WO CONTRAST  . MR MRA HEAD WO CONTRAST  . Ambulatory referral to [facility]  . Ambulatory referral to Sleep Studies     Meds ordered this encounter  Medications  . Erenumab-aooe (AIMOVIG) 140 MG/ML SOAJ    Sig: Inject 140 mg into the skin every 30 (thirty) days.    Dispense:  1 pen    Refill:  11  . rizatriptan (MAXALT-MLT) 10 MG disintegrating tablet    Sig: Take 1 tablet (10 mg total) by mouth as needed for migraine. May repeat in 2 hours if needed    Dispense:  9 tablet    Refill:  11  . ondansetron (ZOFRAN-ODT) 4 MG disintegrating tablet    Sig: Take 1 tablet (4 mg total) by mouth every 8 (eight) hours as needed for nausea.    Dispense:  30 tablet    Refill:  3        Cc: Scifres, Dorothy, PA-C  [name], MD  [facility] [address] [place], [place] [address]  Phone [phone] Fax [phone]

## 2018-09-22 ENCOUNTER — Other Ambulatory Visit (HOSPITAL_COMMUNITY): Payer: Medicare Other | Admitting: Licensed Clinical Social Worker

## 2018-09-22 ENCOUNTER — Other Ambulatory Visit (HOSPITAL_COMMUNITY): Payer: Medicare Other | Admitting: Occupational Therapy

## 2018-09-22 ENCOUNTER — Encounter (HOSPITAL_COMMUNITY): Payer: Self-pay

## 2018-09-22 DIAGNOSIS — F070 Personality change due to known physiological condition: Secondary | ICD-10-CM | POA: Diagnosis not present

## 2018-09-22 DIAGNOSIS — F411 Generalized anxiety disorder: Secondary | ICD-10-CM | POA: Diagnosis not present

## 2018-09-22 DIAGNOSIS — Z79899 Other long term (current) drug therapy: Secondary | ICD-10-CM | POA: Diagnosis not present

## 2018-09-22 DIAGNOSIS — Z880 Allergy status to penicillin: Secondary | ICD-10-CM | POA: Diagnosis not present

## 2018-09-22 DIAGNOSIS — E282 Polycystic ovarian syndrome: Secondary | ICD-10-CM | POA: Diagnosis not present

## 2018-09-22 DIAGNOSIS — F314 Bipolar disorder, current episode depressed, severe, without psychotic features: Secondary | ICD-10-CM

## 2018-09-22 DIAGNOSIS — R4589 Other symptoms and signs involving emotional state: Secondary | ICD-10-CM

## 2018-09-22 NOTE — Therapy (Signed)
[place], [place], [address] Phone: [phone]   Fax:  [phone]  Occupational Therapy Treatment  Patient Details  Name: Christine [name] MRN: [id number] Date of Birth: [birth year] Referring Provider (OT): Maudry Mayhew   Encounter Date: 09/22/2018  OT End of Session - 09/22/18 1551    Visit Number  4    Number of Visits  16    Date for OT Re-Evaluation  10/15/18    Authorization Type  BCBS    OT Start Time  1100    OT Stop Time  1200    OT Time Calculation (min)  60 min    Activity Tolerance  Patient tolerated treatment well    Behavior During Therapy  [facility] for tasks assessed/performed       Past Medical History:  Diagnosis Date  . Anemia   . Anxiety   . Arthritis    Hands, ankles  . Bipolar 1 disorder ([place])   . Depression   . Depression   . Fibroids   . Headache   . High blood pressure   . History of blood transfusion 08/2017   [facility]  . History of hiatal hernia   . Mental disorder   . Migraines   . PCOS (polycystic ovarian syndrome)   . Seasonal allergies     Past Surgical History:  Procedure Laterality Date  . CERVICAL POLYPECTOMY  01/15/2018   Procedure: CERVICAL POLYPECTOMY;  Surgeon: Arvella Nigh, MD;  Location: [facility];  Service: Gynecology;;  . COLONOSCOPY  2008   Normal  . DIAGNOSTIC LAPAROSCOPY  08/1999   dermoid cyst, RSO  . DILATION AND CURETTAGE OF UTERUS  05/2004   MAB  . DILATION AND CURETTAGE OF UTERUS  04/2015  . HYSTEROSCOPY W/D&C  07/22/2011   Procedure: DILATATION AND CURETTAGE /HYSTEROSCOPY;  Surgeon: Cyril Mourning, MD;  Location: [facility];  Service: Gynecology;;  . HYSTEROSCOPY W/D&C N/A 01/15/2018   Procedure: DILATATION AND CURETTAGE [place] WITH MYOSURE;  Surgeon: Arvella Nigh, MD;  Location: [facility];  Service: Gynecology;  Laterality: N/A;  . 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  Subjective Assessment - 09/22/18 1551    Currently in Pain?  No/denies        S: "My self esteem is very low"  O:Education given on self esteem and how it relates to daily experiences. Pt asked to give definition of self esteem, and current rating of self esteem. Positive and negative contributors of self esteem to be brainstormed within group in relation to personal experiences. Positive thinking activity then completed for pt to identify several positive traits about themselves. Pt asked to share at end of session.  ?  A: Pt presents to group with blunted affect, engaged and participatory throughout entirety of session. Pt shares that her current self esteem is low. She mentions that other people can influence her self esteem negatively, as well as failures. Pt reports not being able to work at her job and being on disability which has affected her self image greatly. She shares that completing goals/ experiencing accomplishments are what increases her self esteem the most. Positive thinking activity completed at 75% completion, pt sharing "I did all I can do with this". At the end pt stated "although this was really difficult it was good for me and easier than before".  P: OT treatment will be 4x per week while  pt in PHP.                      OT Education - 09/22/18 1551    Education Details  education given on self esteem skills    Person(s) Educated  Patient    Methods  Explanation;Handout    Comprehension  Verbalized understanding       OT Short Term Goals - 09/21/18 1359      OT SHORT TERM GOAL #1   Title  Patient will be educated on strategies to improve psychosocial skills needed to participate fully in all daily, work, and leisure activities.    Time  4    Period  Weeks    Status  On-going    Target Date  10/15/18      OT SHORT TERM GOAL #2   Title  Pt will apply psychosocial skills and coping mechanisms to daily activities in order to function  independently and reintegrate into community dwelling    Time  4    Period  Weeks    Status  On-going    Target Date  10/15/18      OT SHORT TERM GOAL #3   Title  Pt will recall and/or apply 1-3 sleep hygiene strategies to improve BADL routine upon reintegrating into community    Time  4    Period  Weeks    Status  On-going    Target Date  10/15/18      OT SHORT TERM GOAL #4   Title  Pt will choose and/or engage in 1-3 socially engaging leisure activities to improve social participation skills upon reintegrating into community    Time  4    Period  Weeks    Status  On-going      OT SHORT TERM GOAL #5   Title  Pt will engage in goal setting to improve functional BADL/IADL routine upon reintegrating into community    Time  4    Period  Weeks    Status  On-going    Target Date  10/15/18               Plan - 09/22/18 1552    Occupational performance deficits (Please refer to evaluation for details):  ADL's;IADL's;Rest and Sleep;Leisure;Social Participation       Patient will benefit from skilled therapeutic intervention in order to improve the following deficits and impairments:  Decreased coping skills, Decreased psychosocial skills, Other (comment)(decreased ability to engage in BADL and reintegrate into community)  Visit Diagnosis: Organic personality disorder  Difficulty coping    Problem List Patient Active Problem List   Diagnosis Date Noted  . Chronic migraine without aura without status migrainosus, not intractable 09/21/2018  . Bipolar 1 disorder, depressed, severe ([place]) 09/08/2018  . Endometrium, polyp 01/15/2018    Class: Present on Admission  . Fibroids, submucosal 01/15/2018    Class: Present on Admission  . Iron deficiency anemia due to chronic blood loss 09/16/2017  . Menorrhagia with regular cycle   . Symptomatic anemia 08/28/2017  . Acute blood loss anemia 08/28/2017  . Bipolar disorder ([place]) 08/28/2017  . Anxiety 08/28/2017  . Depression  08/28/2017  . Fibroids 08/28/2017  . PCOS (polycystic ovarian syndrome) 08/28/2017  . Fever 08/28/2017  . HTN (hypertension) 08/28/2017  . Morbid obesity ([place]) 12/04/2015  . Pregnancy with history of uterine myomectomy 04/07/2015  . History of right oophorectomy 02/21/2015  . History of recurrent miscarriages 02/01/2015  . Vitamin D deficiency 02/01/2015  Zenovia Jarred, MSOT, OTR/L Behavioral Health OT/ Acute Relief OT PHP Office: [place] 09/22/2018, 3:52 PM  [facility] PARTIAL HOSPITALIZATION PROGRAM [place], [place], [address] Phone: [phone]   Fax:  [phone]  Name: Christine [name] MRN: [id number] Date of Birth: 08-31-1975

## 2018-09-23 ENCOUNTER — Other Ambulatory Visit (HOSPITAL_COMMUNITY): Payer: Medicare Other | Admitting: Licensed Clinical Social Worker

## 2018-09-23 ENCOUNTER — Telehealth: Payer: Self-pay | Admitting: Neurology

## 2018-09-23 ENCOUNTER — Encounter (HOSPITAL_COMMUNITY): Payer: Self-pay

## 2018-09-23 DIAGNOSIS — Z79899 Other long term (current) drug therapy: Secondary | ICD-10-CM | POA: Diagnosis not present

## 2018-09-23 DIAGNOSIS — F070 Personality change due to known physiological condition: Secondary | ICD-10-CM | POA: Diagnosis not present

## 2018-09-23 DIAGNOSIS — Z880 Allergy status to penicillin: Secondary | ICD-10-CM | POA: Diagnosis not present

## 2018-09-23 DIAGNOSIS — E282 Polycystic ovarian syndrome: Secondary | ICD-10-CM | POA: Diagnosis not present

## 2018-09-23 DIAGNOSIS — F411 Generalized anxiety disorder: Secondary | ICD-10-CM

## 2018-09-23 DIAGNOSIS — F314 Bipolar disorder, current episode depressed, severe, without psychotic features: Secondary | ICD-10-CM | POA: Diagnosis not present

## 2018-09-23 NOTE — Psych (Signed)
   [facility] PHP THERAPIST PROGRESS NOTE  Marissa Weaver [id number]  Session Time: 9:00 - 11:00  Participation Level: Active  Behavioral Response: CasualAlertAnxiousDepressed  Type of Therapy: Group Therapy; Psychotherapy  Treatment Goals addressed: Coping  Interventions: CBT, DBT, Solution Focused, Supportive and Reframing  Summary: Clinician led check-in regarding current stressors and situation, and review of patient completed daily inventory. Clinician utilized active listening and empathetic response and validated patient emotions. Clinician facilitated processing group on pertinent issues.  Therapist Response: Patient arrived within time allowed and reports that she is feeling "kind of lost". Patient rates her mood at a 3 on a scale of 1-10 with 10 being great. Pt reports she has "had a really hard time since I left the hospital". Pt. states that at home things are back to normal. Her family is dependent on her for everything and she's back to doing what she used to do before she was admitted. Pt reports disappointment.  Pt states that "I know that I need to be making changes" but doesn't know how to apply them to her daily life. Patient struggles with setting boundaries with her family. Patient engaged in discussion.        Session Time: 11:00- 12:15  Participation Level: Active  Behavioral Response: CasualAlertAnxious  Type of Therapy: Group Therapy; Psychotherapy, Activity Therapy  Treatment Goals addressed: Coping  Interventions: Supportive; Reframing; Strengths based  Summary: Spiritual Care group  Therapist Response: Patient engaged in activity and discussion. See chaplain note.       Session Time: 12:15 - 1:00  Participation Level: Active  Behavioral Response: CasualAlertAnxiousDepressed  Type of Therapy: Group Therapy; Activity Therapy  Treatment Goals addressed: Coping  Interventions: Social Skills training; Supportive  Summary:Reflection Group:  Patients encouraged to practice skills and interpersonal techniques or work on mindfulness and relaxation techniques. The importance of self-care and making skills part of a routine to increase usage were stressed  Therapist Response: Patient engaged and participated appropriately.        Session Time: 1:00 - 2:00  Participation Level: Active  Behavioral Response: CasualAlertAnxiousDepressed  Type of Therapy: Group Therapy; Psychotherapy; Psychoeducation  Treatment Goals addressed: Coping  Interventions: Relaxation training; Supportive and Reframing  Summary:1:00 - 1:50 Relaxation Group: Cln led group focused on retraining the body's response to stress.   1:50 - 12:00 Clinician led check-out. Clinician assessed for immediate needs, medication compliance and efficacy, and safety concerns.   Therapist Response: Patient engaged in activity and discussion. At [place], patient rates her mood at a 4 on a scale of 1-10 with 10 being great. Patient reports afternoon plans to buy yarn for a knitting project, pick up her daughter and get [employer] Day cards for school. Patient demonstrates some progress as evidenced by making achievable expectations for herself. Patient denies SI/HI/self-harm thoughts at the end of group.     Suicidal/Homicidal: Nowithout intent/plan  Plan: Patient will continue in PHP while working on decreasing depression and anxiety symptoms and SI, and increasing ability to manage symptoms in a healthy manner.   Diagnosis: Bipolar 1 disorder, depressed, severe ([place]) [F31.4]    1. Bipolar 1 disorder, depressed, severe ([place])     [name], LCSW 09/23/2018

## 2018-09-23 NOTE — Telephone Encounter (Signed)
BCBS Auth: [id number] (exp. 09/23/18 to 10/22/18) order sent to GI. They will reach out to the pt to schedule.

## 2018-09-23 NOTE — Psych (Signed)
[facility] BH PHP THERAPIST PROGRESS NOTE  Christine [name] [id number]  Session Time: 9:00 - 10:00  Participation Level: Active  Behavioral Response: CasualAlertAnxiousDepressed  Type of Therapy: Group Therapy; Psychotherapy  Treatment Goals addressed: Coping  Interventions: CBT, DBT, Solution Focused, Supportive and Reframing  Summary: Clinician led check-in regarding current stressors and situation, and review of patient completed daily inventory. Clinician utilized active listening and empathetic response and validated patient emotions. Clinician facilitated processing group on pertinent issues.  Therapist Response: Patient arrived within time allowed and reports that she is feeling "okay but I didn't feel like getting up this morning". Patient rates her mood at a 5 on a scale of 1-10 with 10 being great. Pt reports that "yesterday wasn't too bad" and that she asked her husband to get the [facility] Day cards for the girls and he did. Pt. states husband gave her a Valentine Day gift and she didn't get him anything and she feels guilty about it. Pt. states that "I was feeling emotional on the drive here" and that she "wants to be done with feeling this way". Pt. states she felt good about asking her husband directly for help yesterday. Pt. states she also feels good because she overheard husband tell daughters they should ask him for help too. Patient struggles with goal setting.     Session Time: 10:00 - 11:00  Participation Level: Active  Behavioral Response: CasualAlertAnxiousDepressed  Type of Therapy: Group Therapy; Psychotherapy  Treatment Goals addressed: Coping  Interventions: CBT, DBT, Solution Focused, Supportive and Reframing  Summary: Clinician led discussion on how formulate goals for pressing issues. Patients discussed areas where they feel they could incorporate goals that are achievable.   Therapist Response: Patient engaged and participated in discussion. Pt. states  that she will create goals to finding a new job, such as trying to make an appointment with vocational rehab.        Session Time: 11:00 -12:00   Participation Level: Active   Behavioral Response: CasualAlertDepressed   Type of Therapy: Group Therapy, OT   Treatment Goals addressed: Coping   Interventions: Psychosocial skills training, Supportive,    Summary:  Occupational Therapy group   Therapist Response: Patient engaged in group. See OT note.             Session Time: 12:00 - 1:00  Participation Level: Active  Behavioral Response: CasualAlertAnxiousDepressed  Type of Therapy: Group Therapy; Psychotherapy; Psychoeducation  Treatment Goals addressed: Coping  Interventions: CBT, DBT, Solution Focused, Supportive and Reframing  Summary: 12:00 - 12:50: Clinician introduced "Listening to Shame" Ted Talk. Group members discussed ways they could identify how shame about mental health issues can affect how they reach out to their support systems.  12:50 -1:00 Clinician led check-out. Clinician assessed for immediate needs, medication compliance and efficacy, and safety concerns    Therapist Response: Patient engaged in activity and discussion and discussed ways to speak with her brother about mental health issues, while using boundaries so not to disclose too much information.  At [place], patient rates her mood at a 5 on a scale of 1-10 with 10 being great. Patient reports weekend plans to set goals and plans for the next few days and weeks. Patient demonstrates some progress as evidenced by using assertive statements to specify when she needs help. Pt denies SI/HI/self-harm thought at end of group.       Suicidal/Homicidal: Nowithout intent/plan  Plan: Patient will continue in PHP while working on decreasing depression and anxiety symptoms and  SI, and increasing ability to manage symptoms in a healthy manner.   Diagnosis: Bipolar 1 disorder, depressed,  severe ([place]) [F31.4]    1. Bipolar 1 disorder, depressed, severe ([place])     [name], LCSW 09/23/2018

## 2018-09-23 NOTE — Progress Notes (Signed)
[facility] MD/PA/NP OP Progress Note  09/23/2018 12:31 PM Christine [name]  MRN:  [id number]   Evaluation: Christine [name] is awake alert and oriented x3.  Presents flat, guarded but pleasant.  Reports her recent inpatient admission due to worsening depression and financial strain.  States the medication adjustment was initiated during her inpatient admission reports taking and tolerating Abilify well she had to discontinue [name] due to insurance. Christine [name] reports mood fluctuation with mood irritability.  States her depressions 5 out of 10 with 10 being the worst.  Does report her suicidal ideations has improved since her discharge.  Continues to express passive ideations, denies intent or plan.  Denies auditory visual hallucinations.  Patient to continue partial hospitalization.  NP will follow up with medication adherence/tolerance with medication adjustment.  Reports a good appetite.  States she is resting well throughout the night.  Continues to report her husband has been supportive since she was admitted.  Patient was agreeable to plan.  Support encouragement reassurance was provided.  History: Per admission assessment note: Patient is a 43 y.o. African American female  with bipolar disorder, most recent episode depressed, severe who presents with depression and anxiety. Patient recently discharged on 09/12/2018 from inpatient behavioral health hospital.  Patient was enrolled in partial psychiatric program on 2/12/2020Patient reports that she continues to struggle with her mood, knows that she needs to work on her coping skills, gets overwhelmed easily, feels isolated.  Patient has that she does not have much of a social life, knows that she needs to join a church but has not done so.  Patient has that her husband is supportive and states that she loves her 2 kids.  Patient has that she is no longer having suicidal thoughts since her discharge from the hospital, feels she needs to work in a program so she is not suicidal  again and has improvement in her depression and anxiety.  On a scale of 0-10, with 0 being no symptoms and 10 being the worst, patient reports that her depression is an 8 out of 10 and her anxiety is a 7 out of 10 as she worries about her not getting better.  Patient denies any symptoms of mania, any substance use.  Visit Diagnosis:    ICD-10-CM   1. Bipolar 1 disorder, depressed, severe ([employer]) F31.4   2. Generalized anxiety disorder F41.1     Past Psychiatric History:   Past Medical History:  Past Medical History:  Diagnosis Date  . Anemia   . Anxiety   . Arthritis    Hands, ankles  . Bipolar 1 disorder ([place])   . Depression   . Depression   . Fibroids   . Headache   . High blood pressure   . History of blood transfusion 08/2017   [facility]  . History of hiatal hernia   . Mental disorder   . Migraines   . PCOS (polycystic ovarian syndrome)   . Seasonal allergies     Past Surgical History:  Procedure Laterality Date  . CERVICAL POLYPECTOMY  01/15/2018   Procedure: CERVICAL POLYPECTOMY;  Surgeon: Arvella Nigh, MD;  Location: [facility];  Service: Gynecology;;  . COLONOSCOPY  2008   Normal  . DIAGNOSTIC LAPAROSCOPY  08/1999   dermoid cyst, [facility]  . DILATION AND CURETTAGE OF UTERUS  05/2004   [facility]  . DILATION AND CURETTAGE OF UTERUS  04/2015  . HYSTEROSCOPY W/D&C  07/22/2011   Procedure: DILATATION AND CURETTAGE /HYSTEROSCOPY;  Surgeon: Cyril Mourning, MD;  Location:  [facility];  Service: Gynecology;;  . HYSTEROSCOPY W/D&C N/A 01/15/2018   Procedure: DILATATION AND CURETTAGE [place] WITH MYOSURE;  Surgeon: Arvella Nigh, MD;  Location: [facility];  Service: Gynecology;  Laterality: N/A;  . 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 Anxiety disorder Mother   . Depression Mother   . Cancer Mother        pancreatic   . High blood pressure Mother   . Cancer Father        colon  . High  blood pressure Father   . Diabetes Father   . Cancer Brother 6       leukemia, childhood  . High blood pressure Brother   . Migraines Neg Hx     Social History:  Social History   Socioeconomic History  . Marital status: Married    Spouse name: Not on file  . Number of children: 2  . Years of education: 2 years of grad school + bach & HS  . Highest education level: Bachelor's degree (e.g., BA, AB, BS)  Occupational History  . Not on file  Social Needs  . Financial resource strain: Somewhat hard  . Food insecurity:    Worry: Sometimes true    Inability: Sometimes true  . Transportation needs:    Medical: No    Non-medical: No  Tobacco Use  . Smoking status: Never Smoker  . Smokeless tobacco: Never Used  Substance and Sexual Activity  . Alcohol use: Yes    Comment: occasional, maybe once a month or so  . Drug use: No  . Sexual activity: Yes    Partners: Male    Birth control/protection: None  Lifestyle  . Physical activity:    Days per week: 0 days    Minutes per session: Not on file  . Stress: Rather much  Relationships  . Social connections:    Talks on phone: Never    Gets together: Never    Attends religious service: Never    Active member of club or organization: No    Attends meetings of clubs or organizations: Never    Relationship status: Not on file  Other Topics Concern  . Not on file  Social History Narrative   Lives at home with her husband and daughters   Right handed   Caffeine: decreased intake, drinks rarely     Allergies:  Allergies  Allergen Reactions  . Penicillins Rash and Other (See Comments)    Has patient had a PCN reaction causing immediate rash, facial/tongue/throat swelling, SOB or lightheadedness with hypotension: Yes Has patient had a PCN reaction causing severe rash involving mucus membranes or skin necrosis: Unknown Has patient had a PCN reaction that required hospitalization: No Has patient had a PCN reaction occurring  within the last 10 years: No If all of the above answers are "NO", then may proceed with Cephalosporin use.     Metabolic Disorder Labs: No results found for: HGBA1C, MPG No results found for: PROLACTIN No results found for: CHOL, TRIG, HDL, CHOLHDL, VLDL, LDLCALC Lab Results  Component Value Date   TSH 1.460 04/28/2018    Therapeutic Level Labs: Lab Results  Component Value Date   LITHIUM 0.85 09/08/2018   LITHIUM <0.1 (L) 04/28/2018   No results found for: VALPROATE No components found for:  CBMZ  Current Medications: Current Outpatient Medications  Medication Sig Dispense  Refill  . acetaminophen (TYLENOL) 325 MG tablet Take 2 tablets (650 mg total) by mouth every 6 (six) hours as needed for mild pain or moderate pain. (May buy over the counter) 1 tablet 0  . Albuterol Sulfate (PROAIR HFA IN) Inhale into the lungs every 6 (six) hours as needed. 1 puff every 6 hours PRN    . amLODipine (NORVASC) 10 MG tablet Take 1 tablet (10 mg total) by mouth daily. 30 tablet 0  . ARIPiprazole (ABILIFY) 10 MG tablet Take 1 tablet (10 mg total) by mouth daily. For mood 30 tablet 1  . Erenumab-aooe (AIMOVIG) 140 MG/ML SOAJ Inject 140 mg into the skin every 30 (thirty) days. 1 pen 11  . hydrOXYzine (ATARAX/VISTARIL) 25 MG tablet Take 1 tablet (25 mg total) by mouth 3 (three) times daily as needed for anxiety. 60 tablet 1  . lamoTRIgine (LAMICTAL) 150 MG tablet Take 1 tablet (150 mg total) by mouth 2 (two) times daily. For mood 60 tablet 1  . lithium 600 MG capsule Take 1 capsule (600 mg total) by mouth 2 (two) times daily with a meal. For mood 60 capsule 1  . Melatonin 3 MG TABS Take 1 tablet (3 mg total) by mouth at bedtime as needed (sleep). 30 tablet 1  . metoprolol succinate (TOPROL-XL) 50 MG 24 hr tablet Take 50 mg by mouth daily. Take with or immediately following a meal.    . naproxen sodium (ALEVE) 220 MG tablet Take 440 mg by mouth daily as needed (pain).    . ondansetron (ZOFRAN-ODT) 4  MG disintegrating tablet Take 1 tablet (4 mg total) by mouth every 8 (eight) hours as needed for nausea. 30 tablet 3  . rizatriptan (MAXALT-MLT) 10 MG disintegrating tablet Take 1 tablet (10 mg total) by mouth as needed for migraine. May repeat in 2 hours if needed 9 tablet 11   No current facility-administered medications for this visit.      Musculoskeletal: Strength & Muscle Tone: within normal limits Gait & Station: normal Patient leans: N/A  Psychiatric Specialty Exam: Review of Systems  Psychiatric/Behavioral: Positive for depression. Negative for hallucinations and suicidal ideas. The patient is nervous/anxious.   All other systems reviewed and are negative.   Last menstrual period 09/09/2018.There is no height or weight on file to calculate BMI.  General Appearance: Casual  Eye Contact:  Good  Speech:  slightly pressured  Volume:  Normal  Mood:  Anxious and Depressed  Affect:  Depressed and Tearful  Thought Process:  Coherent  Orientation:  Full (Time, Place, and Person)  Thought Content: WDL   Suicidal Thoughts:  No  Homicidal Thoughts:  No  Memory:  Immediate;   Fair Recent;   Fair Remote;   Fair  Judgement:  Fair  Insight:  Fair  Psychomotor Activity:  Normal  Concentration:  Concentration: Fair  Recall:  [employer] of Knowledge: Fair  Language: Fair  Akathisia:  No  Handed:  Right  AIMS (if indicated):   Assets:  Communication Skills Desire for Improvement Resilience Social Support  ADL's:  Intact  Cognition: WNL  Sleep:  Fair   Screenings: AIMS     Admission (Discharged) from 09/08/2018 in [facility]  AIMS Total Score  0    AUDIT     Admission (Discharged) from 09/08/2018 in [facility]  Alcohol Use Disorder Identification Test Final Score (AUDIT)  1    GAD-7     Counselor from  09/09/2017 in [facility] Counselor from 04/04/2017 in [facility] Counselor from 03/18/2017 in [facility]  Total GAD-7 Score  16  7  18     PHQ2-9     Counselor from 09/16/2018 in [facility] Counselor from 09/26/2017 in [place] Counselor from 09/19/2017 in [facility] Counselor from 09/09/2017 in [facility] Counselor from 04/04/2017 in [facility]  PHQ-2 Total Score  6  3  4  6  2   PHQ-9 Total Score  16  12  16  21  10        Assessment and Plan:  -Continue partial hospitalization program   Patient to continue Lamictal 150 p.o. twice daily continue Abilify 10 mg p.o. daily, Lithium  600 mg p.o. daily  Treatment plan was reviewed and agreed upon by NP T. Bobby Rumpf and patient Terren Haberle need for continued group services   Derrill [place], NP 09/23/2018, 12:31 PM

## 2018-09-23 NOTE — Psych (Signed)
   [facility] BH PHP THERAPIST PROGRESS NOTE  Christine [name] [id number]  Session Time: 9:00 - 11:00  Participation Level: Active  Behavioral Response: CasualAlertAnxiousDepressed  Type of Therapy: Group Therapy; Psychotherapy  Treatment Goals addressed: Coping  Interventions: CBT, DBT, Solution Focused, Supportive and Reframing  Summary: Clinician led check-in regarding current stressors and situation, and review of patient completed daily inventory. Clinician utilized active listening and empathetic response and validated patient emotions. Clinician facilitated processing group on pertinent issues.  Therapist Response: Patient arrived within time allowed and reports "my moods all over the place". Pt. rates her mood at a 2 on a scale of 1-10 with 10 being great. Pt reports "I woke up depressed and I hate when that happens" and her mind was "racing with random thoughts". Pt. states she went to the store yesterday and was irritated by little things that she "logically knew were not important". Pt states that she was crying last night and "couldn't figure out why" and her daughter saw her and she felt like a failure as a parent. Patient continues to struggle with mood regulation as evidenced by sudden irritability and crying. Patient engaged in discussion.        Session Time: 11:00 -12:00   Participation Level: Active   Behavioral Response: CasualAlertDepressed   Type of Therapy: Group Therapy, OT   Treatment Goals addressed: Coping   Interventions: Psychosocial skills training, Supportive,    Summary:  Occupational Therapy group   Therapist Response: Patient engaged in group. See OT note.            Session Time: 12:00 - 12:45  Participation Level: Active  Behavioral Response: CasualAlertDepressed  Type of Therapy: Group Therapy, Activity Therapy  Treatment Goals addressed: Coping  Interventions: Systems analyst, Supportive  Summary:  Reflection Group:  Patients encouraged to practice skills and interpersonal techniques or work on mindfulness and relaxation techniques. The importance of self-care and making skills part of a routine to increase usage were stressed   Therapist Response: Patient engaged and participated appropriately.          Session Time: 12:45 - 2:00  Participation Level: Active  Behavioral Response: CasualAlertAnxiousDepressed  Type of Therapy: Group Therapy; Psychotherapy; Psychoeducation  Treatment Goals addressed: Coping  Interventions: CBT, DBT, Solution Focused, Supportive and Reframing  Summary:12:45 - 1:50 Cln introduced "I" Statements and how to formulate them as well as avoid common pitfalls. Group reviewed handout  "Fair Fighting Rules" and discussed how to apply them during arguments. 1:50 - 12:00 Clinician led check-out. Clinician assessed for immediate needs, medication compliance and efficacy, and safety concerns.   Therapist Response: Patient engaged in group. Pt reports understanding of "I" Statements and how to use them during difficult conversations and chose "no yelling" as "[place]".  At [place], patient rates her mood at a 4 on a scale of 1-10 with 10 being great. Patient reports plans to pick daughter from school and take older daughter to tutor. Patient demonstrates some progress as evidenced by making specific requests when she needs help from husband. Patient denies SI/HI/self-harm at the end of group.      Suicidal/Homicidal: Nowithout intent/plan  Plan: Patient will continue in PHP while working on decreasing depression and anxiety symptoms and SI, and increasing ability to manage symptoms in a healthy manner.   Diagnosis: Bipolar 1 disorder, depressed, severe ([place]) [F31.4]    1. Bipolar 1 disorder, depressed, severe ([place])     [name], LCSW 09/23/2018

## 2018-09-24 ENCOUNTER — Other Ambulatory Visit: Payer: Self-pay

## 2018-09-24 ENCOUNTER — Other Ambulatory Visit (HOSPITAL_COMMUNITY): Payer: Medicare Other | Admitting: Occupational Therapy

## 2018-09-24 ENCOUNTER — Other Ambulatory Visit (HOSPITAL_COMMUNITY): Payer: Medicare Other | Admitting: Licensed Clinical Social Worker

## 2018-09-24 ENCOUNTER — Encounter (HOSPITAL_COMMUNITY): Payer: Self-pay

## 2018-09-24 VITALS — BP 134/64 | HR 76 | Resp 20 | Wt 352.8 lb

## 2018-09-24 DIAGNOSIS — R4589 Other symptoms and signs involving emotional state: Secondary | ICD-10-CM

## 2018-09-24 DIAGNOSIS — F070 Personality change due to known physiological condition: Secondary | ICD-10-CM

## 2018-09-24 DIAGNOSIS — F411 Generalized anxiety disorder: Secondary | ICD-10-CM

## 2018-09-24 DIAGNOSIS — F314 Bipolar disorder, current episode depressed, severe, without psychotic features: Secondary | ICD-10-CM | POA: Diagnosis not present

## 2018-09-24 DIAGNOSIS — Z79899 Other long term (current) drug therapy: Secondary | ICD-10-CM | POA: Diagnosis not present

## 2018-09-24 DIAGNOSIS — Z880 Allergy status to penicillin: Secondary | ICD-10-CM | POA: Diagnosis not present

## 2018-09-24 DIAGNOSIS — E282 Polycystic ovarian syndrome: Secondary | ICD-10-CM | POA: Diagnosis not present

## 2018-09-24 NOTE — Therapy (Signed)
[place], [place], [address] Phone: [phone]   Fax:  [phone]  Occupational Therapy Treatment  Patient Details  Name: Christine [name] MRN: [id number] Date of Birth: 04/20/1976 Referring Provider (OT): Maudry Mayhew   Encounter Date: 09/24/2018  OT End of Session - 09/24/18 1327    Visit Number  5    Number of Visits  16    Date for OT Re-Evaluation  10/15/18    Authorization Type  BCBS    OT Start Time  1100    OT Stop Time  1200    OT Time Calculation (min)  60 min    Activity Tolerance  Patient tolerated treatment well    Behavior During Therapy  [facility] for tasks assessed/performed       Past Medical History:  Diagnosis Date  . Anemia   . Anxiety   . Arthritis    Hands, ankles  . Bipolar 1 disorder ([place])   . Depression   . Depression   . Fibroids   . Headache   . High blood pressure   . History of blood transfusion 08/2017   [facility]  . History of hiatal hernia   . Mental disorder   . Migraines   . PCOS (polycystic ovarian syndrome)   . Seasonal allergies     Past Surgical History:  Procedure Laterality Date  . CERVICAL POLYPECTOMY  01/15/2018   Procedure: CERVICAL POLYPECTOMY;  Surgeon: Arvella Nigh, MD;  Location: [facility];  Service: Gynecology;;  . COLONOSCOPY  2008   Normal  . DIAGNOSTIC LAPAROSCOPY  08/1999   dermoid cyst, RSO  . DILATION AND CURETTAGE OF UTERUS  05/2004   MAB  . DILATION AND CURETTAGE OF UTERUS  04/2015  . HYSTEROSCOPY W/D&C  07/22/2011   Procedure: DILATATION AND CURETTAGE /HYSTEROSCOPY;  Surgeon: Cyril Mourning, MD;  Location: [facility];  Service: Gynecology;;  . HYSTEROSCOPY W/D&C N/A 01/15/2018   Procedure: DILATATION AND CURETTAGE [place] WITH MYOSURE;  Surgeon: Arvella Nigh, MD;  Location: [facility];  Service: Gynecology;  Laterality: N/A;  . 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  Subjective Assessment - 09/24/18 1327    Currently in Pain?  Other (Comment)   chronic pain        S: "I have a hard time with comparing myself to others"   O:Eduation given on importance of social participation when reintegrating into community. Further education given on varying types of social participation (emotional, tangible, informational, and social needs), how they can be of use, the barriers, and how to apply them to current problems. Additional information given on community options for socially engaging leisure activities. Pt to choose one area this date that will help improve social participation.   A: Pt presents to group with blunted affect, with one episode of tearfulness this session, pt engaged this session. She shares that her social supports are 2 friends and her husband. She also shares that she lacks tangible support outside of her husband, because her friends are not local. She shares that she often compares herself to the individuals she went to school with by saying "my life should be that successful", and began getting tearful. Educated pt on the disadvantage of using comparisons. Pt shares she would like to join a class or club in the community to help increase her local socialization.  P: OT will continue to follow  up on social participation information for increased implementation into daily routine.                   OT Education - 09/24/18 1327    Education Details  education given on social participation skills    Person(s) Educated  Patient    Methods  Explanation;Handout    Comprehension  Verbalized understanding       OT Short Term Goals - 09/21/18 1359      OT SHORT TERM GOAL #1   Title  Patient will be educated on strategies to improve psychosocial skills needed to participate fully in all daily, work, and leisure activities.    Time  4    Period  Weeks    Status  On-going    Target Date  10/15/18      OT SHORT TERM GOAL  #2   Title  Pt will apply psychosocial skills and coping mechanisms to daily activities in order to function independently and reintegrate into community dwelling    Time  4    Period  Weeks    Status  On-going    Target Date  10/15/18      OT SHORT TERM GOAL #3   Title  Pt will recall and/or apply 1-3 sleep hygiene strategies to improve BADL routine upon reintegrating into community    Time  4    Period  Weeks    Status  On-going    Target Date  10/15/18      OT SHORT TERM GOAL #4   Title  Pt will choose and/or engage in 1-3 socially engaging leisure activities to improve social participation skills upon reintegrating into community    Time  4    Period  Weeks    Status  On-going      OT SHORT TERM GOAL #5   Title  Pt will engage in goal setting to improve functional BADL/IADL routine upon reintegrating into community    Time  4    Period  Weeks    Status  On-going    Target Date  10/15/18               Plan - 09/24/18 1327    Occupational performance deficits (Please refer to evaluation for details):  ADL's;IADL's;Rest and Sleep;Leisure;Social Participation       Patient will benefit from skilled therapeutic intervention in order to improve the following deficits and impairments:  Decreased coping skills, Decreased psychosocial skills, Other (comment)(decreased ability to engage in BADL And reintegrate into community)  Visit Diagnosis: Organic personality disorder  Difficulty coping    Problem List Patient Active Problem List   Diagnosis Date Noted  . Chronic migraine without aura without status migrainosus, not intractable 09/21/2018  . Bipolar 1 disorder, depressed, severe ([place]) 09/08/2018  . Endometrium, polyp 01/15/2018    Class: Present on Admission  . Fibroids, submucosal 01/15/2018    Class: Present on Admission  . Iron deficiency anemia due to chronic blood loss 09/16/2017  . Menorrhagia with regular cycle   . Symptomatic anemia 08/28/2017  .  Acute blood loss anemia 08/28/2017  . Bipolar disorder ([place]) 08/28/2017  . Anxiety 08/28/2017  . Depression 08/28/2017  . Fibroids 08/28/2017  . PCOS (polycystic ovarian syndrome) 08/28/2017  . Fever 08/28/2017  . HTN (hypertension) 08/28/2017  . Morbid obesity ([place]) 12/04/2015  . Pregnancy with history of uterine myomectomy 04/07/2015  . History of right oophorectomy 02/21/2015  . History of recurrent miscarriages 02/01/2015  .  Vitamin D deficiency 02/01/2015   Zenovia Jarred, MSOT, OTR/L Behavioral Health OT/ Acute Relief OT PHP Office: [phone]  Zenovia Jarred 09/24/2018, 1:32 PM  [facility] HOSPITALIZATION PROGRAM [place], [place], [address] Phone: [phone]   Fax:  [phone]  Name: Christine [name] MRN: [id number] Date of Birth: 08-25-1975

## 2018-09-24 NOTE — Progress Notes (Signed)
Patient pleasant, smiling, very talkative with poor eye contact. Rates pain at 5 in her chest from bronchitis. Finished her antibiotics yesterday and has an inhaler that is helping but does not feel 100%. Will be seeing a doctor soon to find out if she may have asthma. Feels better today than she has been feeling. Reviewed medications and patient states that she does take Clonazepam 0.5mg  BID prn that was prescribed by [name]. She was taken off of it during her inpatient stay because she states they told her they do not like giving out controlled drugs. She is asking that it be added back to her medications because she feels that it helps with anxiety/racing thoughts. She only takes it on average twice a week. Does not use Vistaril much because she was waking up in the middle of the night and feels it doesn't really help. She is enjoying the program and is getting more from it this time since she stepped down from inpatient. Has been working on different skills and feels she has more knowledge of what is going on. Feels more motivated this time. On scale of 1-10 as 10 being worst, Rates depression at 7, Hopelessness at 8, and Anxiety at 6. Denies SI/HI and psychosis. Has noticed that she is rapid cycling and more manic than normal. Feels this started before her inpatient stay Feb 2nd-8th. No side effects from medication or complaints noted.

## 2018-09-25 ENCOUNTER — Other Ambulatory Visit (HOSPITAL_COMMUNITY): Payer: Medicare Other | Admitting: Occupational Therapy

## 2018-09-25 ENCOUNTER — Other Ambulatory Visit (HOSPITAL_COMMUNITY): Payer: Medicare Other | Admitting: Licensed Clinical Social Worker

## 2018-09-25 ENCOUNTER — Encounter (HOSPITAL_COMMUNITY): Payer: Self-pay | Admitting: Occupational Therapy

## 2018-09-25 DIAGNOSIS — F411 Generalized anxiety disorder: Secondary | ICD-10-CM | POA: Diagnosis not present

## 2018-09-25 DIAGNOSIS — F070 Personality change due to known physiological condition: Secondary | ICD-10-CM | POA: Diagnosis not present

## 2018-09-25 DIAGNOSIS — Z880 Allergy status to penicillin: Secondary | ICD-10-CM | POA: Diagnosis not present

## 2018-09-25 DIAGNOSIS — E282 Polycystic ovarian syndrome: Secondary | ICD-10-CM | POA: Diagnosis not present

## 2018-09-25 DIAGNOSIS — Z79899 Other long term (current) drug therapy: Secondary | ICD-10-CM | POA: Diagnosis not present

## 2018-09-25 DIAGNOSIS — F314 Bipolar disorder, current episode depressed, severe, without psychotic features: Secondary | ICD-10-CM | POA: Diagnosis not present

## 2018-09-25 DIAGNOSIS — R4589 Other symptoms and signs involving emotional state: Secondary | ICD-10-CM

## 2018-09-25 NOTE — Therapy (Signed)
[place], [place], [address] Phone: [phone]   Fax:  [phone]  Occupational Therapy Treatment  Patient Details  Name: Christine [name] MRN: [id number] Date of Birth: 1976/02/24 Referring Provider (OT): Maudry Mayhew   Encounter Date: 09/25/2018  OT End of Session - 09/25/18 1310    Visit Number  6    Number of Visits  16    Date for OT Re-Evaluation  10/15/18    Authorization Type  BCBS    OT Start Time  1100    OT Stop Time  1200    OT Time Calculation (min)  60 min    Activity Tolerance  Patient tolerated treatment well    Behavior During Therapy  [facility] for tasks assessed/performed       Past Medical History:  Diagnosis Date  . Anemia   . Anxiety   . Arthritis    Hands, ankles  . Bipolar 1 disorder ([place])   . Depression   . Depression   . Fibroids   . Headache   . High blood pressure   . History of blood transfusion 08/2017   [facility]  . History of hiatal hernia   . Mental disorder   . Migraines   . PCOS (polycystic ovarian syndrome)   . Seasonal allergies     Past Surgical History:  Procedure Laterality Date  . CERVICAL POLYPECTOMY  01/15/2018   Procedure: CERVICAL POLYPECTOMY;  Surgeon: Arvella Nigh, MD;  Location: [facility];  Service: Gynecology;;  . COLONOSCOPY  2008   Normal  . DIAGNOSTIC LAPAROSCOPY  08/1999   dermoid cyst, RSO  . DILATION AND CURETTAGE OF UTERUS  05/2004   MAB  . DILATION AND CURETTAGE OF UTERUS  04/2015  . HYSTEROSCOPY W/D&C  07/22/2011   Procedure: DILATATION AND CURETTAGE /HYSTEROSCOPY;  Surgeon: Cyril Mourning, MD;  Location: [facility];  Service: Gynecology;;  . HYSTEROSCOPY W/D&C N/A 01/15/2018   Procedure: DILATATION AND CURETTAGE [place] WITH MYOSURE;  Surgeon: Arvella Nigh, MD;  Location: [facility];  Service: Gynecology;  Laterality: N/A;  . 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  Subjective Assessment - 09/25/18 1309    Currently in Pain?  Other (Comment)   chronic pain       S: "I have made these before, they can be difficult"   O: Education given on goal setting in the form of artistic expression. Pt asked to brainstorm goals for BADL/IADL routine prior to reintegrating into community. Pt then to display goals through artistic media in the form of a vision board. Music played to facilitate additional relaxation response. Pt asked to share finished work at end of session, and to display in well viewed area in home for additional reminder.  A: Pt presents to group with blunted affect, engaged and participatory throughout entirety of session. Pt hesitant at first to engage in art activity, sharing that it is difficult to think in the future. Pt then developing goals on art project around health/wellness, improving sleep hygiene, wearing clothes that increase her self love, and engaging in a book club. Noting increase in affect by end of session.  P: OT group will be 4x per week while pt in PHP                   OT Education - 09/25/18 1309    Education Details  education given  on goal setting through art therapy    Person(s) Educated  Patient    Methods  Explanation;Handout    Comprehension  Verbalized understanding       OT Short Term Goals - 09/21/18 1359      OT SHORT TERM GOAL #1   Title  Patient will be educated on strategies to improve psychosocial skills needed to participate fully in all daily, work, and leisure activities.    Time  4    Period  Weeks    Status  On-going    Target Date  10/15/18      OT SHORT TERM GOAL #2   Title  Pt will apply psychosocial skills and coping mechanisms to daily activities in order to function independently and reintegrate into community dwelling    Time  4    Period  Weeks    Status  On-going    Target Date  10/15/18      OT SHORT TERM GOAL #3   Title  Pt will recall and/or apply 1-3  sleep hygiene strategies to improve BADL routine upon reintegrating into community    Time  4    Period  Weeks    Status  On-going    Target Date  10/15/18      OT SHORT TERM GOAL #4   Title  Pt will choose and/or engage in 1-3 socially engaging leisure activities to improve social participation skills upon reintegrating into community    Time  4    Period  Weeks    Status  On-going      OT SHORT TERM GOAL #5   Title  Pt will engage in goal setting to improve functional BADL/IADL routine upon reintegrating into community    Time  4    Period  Weeks    Status  On-going    Target Date  10/15/18               Plan - 09/25/18 1310    Occupational performance deficits (Please refer to evaluation for details):  ADL's;IADL's;Rest and Sleep;Leisure;Social Participation       Patient will benefit from skilled therapeutic intervention in order to improve the following deficits and impairments:  Decreased coping skills, Decreased psychosocial skills, Other (comment)(decreased ability to engage in BADL and reintegrate into community)  Visit Diagnosis: Organic personality disorder  Difficulty coping    Problem List Patient Active Problem List   Diagnosis Date Noted  . Chronic migraine without aura without status migrainosus, not intractable 09/21/2018  . Bipolar 1 disorder, depressed, severe ([place]) 09/08/2018  . Endometrium, polyp 01/15/2018    Class: Present on Admission  . Fibroids, submucosal 01/15/2018    Class: Present on Admission  . Iron deficiency anemia due to chronic blood loss 09/16/2017  . Menorrhagia with regular cycle   . Symptomatic anemia 08/28/2017  . Acute blood loss anemia 08/28/2017  . Bipolar disorder ([place]) 08/28/2017  . Anxiety 08/28/2017  . Depression 08/28/2017  . Fibroids 08/28/2017  . PCOS (polycystic ovarian syndrome) 08/28/2017  . Fever 08/28/2017  . HTN (hypertension) 08/28/2017  . Morbid obesity ([place]) 12/04/2015  . Pregnancy with history  of uterine myomectomy 04/07/2015  . History of right oophorectomy 02/21/2015  . History of recurrent miscarriages 02/01/2015  . Vitamin D deficiency 02/01/2015   Zenovia Jarred, MSOT, OTR/L Behavioral Health OT/ Acute Relief OT PHP Office: [phone]  Zenovia Jarred 09/25/2018, 1:11 PM  [place] PARTIAL HOSPITALIZATION PROGRAM [place]  [place], [place], [address] Phone: [phone]   Fax:  [phone]  Name: Bryanna Yim MRN: [id number] Date of Birth: 10-17-1975

## 2018-09-28 ENCOUNTER — Encounter (HOSPITAL_COMMUNITY): Payer: Self-pay | Admitting: Occupational Therapy

## 2018-09-28 ENCOUNTER — Other Ambulatory Visit (HOSPITAL_COMMUNITY): Payer: Medicare Other | Admitting: Licensed Clinical Social Worker

## 2018-09-28 ENCOUNTER — Other Ambulatory Visit (HOSPITAL_COMMUNITY): Payer: Medicare Other | Admitting: Occupational Therapy

## 2018-09-28 DIAGNOSIS — F314 Bipolar disorder, current episode depressed, severe, without psychotic features: Secondary | ICD-10-CM | POA: Diagnosis not present

## 2018-09-28 DIAGNOSIS — E282 Polycystic ovarian syndrome: Secondary | ICD-10-CM | POA: Diagnosis not present

## 2018-09-28 DIAGNOSIS — F070 Personality change due to known physiological condition: Secondary | ICD-10-CM

## 2018-09-28 DIAGNOSIS — R4589 Other symptoms and signs involving emotional state: Secondary | ICD-10-CM

## 2018-09-28 DIAGNOSIS — Z880 Allergy status to penicillin: Secondary | ICD-10-CM | POA: Diagnosis not present

## 2018-09-28 DIAGNOSIS — Z79899 Other long term (current) drug therapy: Secondary | ICD-10-CM | POA: Diagnosis not present

## 2018-09-28 DIAGNOSIS — F411 Generalized anxiety disorder: Secondary | ICD-10-CM

## 2018-09-28 NOTE — Therapy (Signed)
[place], [place], [address] Phone: [phone]   Fax:  [phone]  Occupational Therapy Treatment  Patient Details  Name: Christine [name] MRN: [id number] Date of Birth: January 03, 1976 Referring Provider (OT): Maudry Mayhew   Encounter Date: 09/28/2018  OT End of Session - 09/28/18 1339    Visit Number  7    Number of Visits  16    Date for OT Re-Evaluation  10/15/18    Authorization Type  BCBS    OT Start Time  1100    OT Stop Time  1200    OT Time Calculation (min)  60 min    Activity Tolerance  Patient tolerated treatment well    Behavior During Therapy  [facility] for tasks assessed/performed       Past Medical History:  Diagnosis Date  . Anemia   . Anxiety   . Arthritis    Hands, ankles  . Bipolar 1 disorder ([place])   . Depression   . Depression   . Fibroids   . Headache   . High blood pressure   . History of blood transfusion 08/2017   [facility]  . History of hiatal hernia   . Mental disorder   . Migraines   . PCOS (polycystic ovarian syndrome)   . Seasonal allergies     Past Surgical History:  Procedure Laterality Date  . CERVICAL POLYPECTOMY  01/15/2018   Procedure: CERVICAL POLYPECTOMY;  Surgeon: Arvella Nigh, MD;  Location: [facility];  Service: Gynecology;;  . COLONOSCOPY  2008   Normal  . DIAGNOSTIC LAPAROSCOPY  08/1999   dermoid cyst, RSO  . DILATION AND CURETTAGE OF UTERUS  05/2004   MAB  . DILATION AND CURETTAGE OF UTERUS  04/2015  . HYSTEROSCOPY W/D&C  07/22/2011   Procedure: DILATATION AND CURETTAGE /HYSTEROSCOPY;  Surgeon: Cyril Mourning, MD;  Location: [facility];  Service: Gynecology;;  . HYSTEROSCOPY W/D&C N/A 01/15/2018   Procedure: DILATATION AND CURETTAGE [place] WITH MYOSURE;  Surgeon: Arvella Nigh, MD;  Location: [facility];  Service: Gynecology;  Laterality: N/A;  . 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  Subjective Assessment - 09/28/18 1338    Currently in Pain?  Other (Comment)   chronic pain      S: "The one area I can really improve is how much time I devote to work and obligations"   O: Education received on time management to help increase occupational balance and quality of life. Activities wheel activity administered with pt to identify hours devoted to: work/obligations, leisure/relaxation, self-care/caregiving, and sleep/rest. Further education given to allow pt to better balance activity wheel for increased occupational balance. Weekly planner handouts given at end of session with education on different modalities of organization (planner, apps, computer, to do lists, etc.) so pt could choose preferred avenue. Further education given on the importance of time management in community reintegration from [facility] and how to continue using skills. Additional information given on medication management, from the aspect of lifestyle/organizational skills. Pt given medication chart and various apps to use to help increase compliance of medication while implementing it into BADL routine.   A: Pt presents to group with blunted affect, engaged and participatory throughout entirety of session. Pt seen knitting on breaks during group, a skill she stated she would like to learn and engage in for leisure purposes. Activities wheel completed with pt sharing that she  would like to increase productive time in the area of work/obligations. She shares that she would to consider volunteering, starting a blog, or continuing to learn new skills. Pt also states that she feels a lot more productive and mentally well when she has roles to participate within. She shares that she has still not used her pill organizer she mentioned in previous sessions, continued encouragement. Pt also looking into using time management apps discussed at end of session.  P: OT tx will continue 4x per week while pt  inpatient.                    OT Education - 09/28/18 1338    Education Details  education given on time management skills    Person(s) Educated  Patient    Methods  Explanation;Handout    Comprehension  Verbalized understanding       OT Short Term Goals - 09/21/18 1359      OT SHORT TERM GOAL #1   Title  Patient will be educated on strategies to improve psychosocial skills needed to participate fully in all daily, work, and leisure activities.    Time  4    Period  Weeks    Status  On-going    Target Date  10/15/18      OT SHORT TERM GOAL #2   Title  Pt will apply psychosocial skills and coping mechanisms to daily activities in order to function independently and reintegrate into community dwelling    Time  4    Period  Weeks    Status  On-going    Target Date  10/15/18      OT SHORT TERM GOAL #3   Title  Pt will recall and/or apply 1-3 sleep hygiene strategies to improve BADL routine upon reintegrating into community    Time  4    Period  Weeks    Status  On-going    Target Date  10/15/18      OT SHORT TERM GOAL #4   Title  Pt will choose and/or engage in 1-3 socially engaging leisure activities to improve social participation skills upon reintegrating into community    Time  4    Period  Weeks    Status  On-going      OT SHORT TERM GOAL #5   Title  Pt will engage in goal setting to improve functional BADL/IADL routine upon reintegrating into community    Time  4    Period  Weeks    Status  On-going    Target Date  10/15/18               Plan - 09/28/18 1339    Occupational performance deficits (Please refer to evaluation for details):  ADL's;IADL's;Rest and Sleep;Leisure;Social Participation       Patient will benefit from skilled therapeutic intervention in order to improve the following deficits and impairments:  Decreased coping skills, Decreased psychosocial skills, Other (comment)(decreased ability to engage in BADL and  reintegrate into community)  Visit Diagnosis: Organic personality disorder  Difficulty coping    Problem List Patient Active Problem List   Diagnosis Date Noted  . Chronic migraine without aura without status migrainosus, not intractable 09/21/2018  . Bipolar 1 disorder, depressed, severe ([place]) 09/08/2018  . Endometrium, polyp 01/15/2018    Class: Present on Admission  . Fibroids, submucosal 01/15/2018    Class: Present on Admission  . Iron deficiency anemia due to chronic blood loss 09/16/2017  .  Menorrhagia with regular cycle   . Symptomatic anemia 08/28/2017  . Acute blood loss anemia 08/28/2017  . Bipolar disorder ([place]) 08/28/2017  . Anxiety 08/28/2017  . Depression 08/28/2017  . Fibroids 08/28/2017  . PCOS (polycystic ovarian syndrome) 08/28/2017  . Fever 08/28/2017  . HTN (hypertension) 08/28/2017  . Morbid obesity ([place]) 12/04/2015  . Pregnancy with history of uterine myomectomy 04/07/2015  . History of right oophorectomy 02/21/2015  . History of recurrent miscarriages 02/01/2015  . Vitamin D deficiency 02/01/2015   Zenovia Jarred, MSOT, OTR/L Behavioral Health OT/ Acute Relief OT PHP Office: [phone]  Zenovia Jarred 09/28/2018, 1:40 PM  [facility] HOSPITALIZATION PROGRAM [place], [place], [address] Phone: [phone]   Fax:  [phone]  Name: Christine [name] MRN: [id number] Date of Birth: 09-05-1975

## 2018-09-29 ENCOUNTER — Other Ambulatory Visit (HOSPITAL_COMMUNITY): Payer: Medicare Other | Admitting: Occupational Therapy

## 2018-09-29 ENCOUNTER — Other Ambulatory Visit (HOSPITAL_COMMUNITY): Payer: Medicare Other | Admitting: Licensed Clinical Social Worker

## 2018-09-29 DIAGNOSIS — F070 Personality change due to known physiological condition: Secondary | ICD-10-CM | POA: Diagnosis not present

## 2018-09-29 DIAGNOSIS — F314 Bipolar disorder, current episode depressed, severe, without psychotic features: Secondary | ICD-10-CM | POA: Diagnosis not present

## 2018-09-29 DIAGNOSIS — Z79899 Other long term (current) drug therapy: Secondary | ICD-10-CM | POA: Diagnosis not present

## 2018-09-29 DIAGNOSIS — E282 Polycystic ovarian syndrome: Secondary | ICD-10-CM | POA: Diagnosis not present

## 2018-09-29 DIAGNOSIS — F411 Generalized anxiety disorder: Secondary | ICD-10-CM

## 2018-09-29 DIAGNOSIS — Z880 Allergy status to penicillin: Secondary | ICD-10-CM | POA: Diagnosis not present

## 2018-09-29 DIAGNOSIS — R4589 Other symptoms and signs involving emotional state: Secondary | ICD-10-CM

## 2018-09-29 NOTE — Therapy (Signed)
[place], [place], [address] Phone: [phone]   Fax:  [phone]  Occupational Therapy Treatment  Patient Details  Name: Christine [name] MRN: [id number] Date of Birth: 10-17-1975 Referring Provider (OT): Maudry Mayhew   Encounter Date: 09/29/2018  OT End of Session - 09/29/18 1402    Visit Number  8    Number of Visits  16    Date for OT Re-Evaluation  10/15/18    Authorization Type  BC[employer]    OT Start Time  1100    OT Stop Time  1200    OT Time Calculation (min)  60 min    Activity Tolerance  Patient tolerated treatment well    Behavior During Therapy  [facility] for tasks assessed/performed       Past Medical History:  Diagnosis Date  . Anemia   . Anxiety   . Arthritis    Hands, ankles  . Bipolar 1 disorder ([place])   . Depression   . Depression   . Fibroids   . Headache   . High blood pressure   . History of blood transfusion 08/2017   [facility]  . History of hiatal hernia   . Mental disorder   . Migraines   . PCOS (polycystic ovarian syndrome)   . Seasonal allergies     Past Surgical History:  Procedure Laterality Date  . CERVICAL POLYPECTOMY  01/15/2018   Procedure: CERVICAL POLYPECTOMY;  Surgeon: Arvella Nigh, MD;  Location: [facility];  Service: Gynecology;;  . COLONOSCOPY  2008   Normal  . DIAGNOSTIC LAPAROSCOPY  08/1999   dermoid cyst, RSO  . DILATION AND CURETTAGE OF UTERUS  05/2004   MAB  . DILATION AND CURETTAGE OF UTERUS  04/2015  . HYSTEROSCOPY W/D&C  07/22/2011   Procedure: DILATATION AND CURETTAGE /HYSTEROSCOPY;  Surgeon: Cyril Mourning, MD;  Location: [facility];  Service: Gynecology;;  . HYSTEROSCOPY W/D&C N/A 01/15/2018   Procedure: DILATATION AND CURETTAGE [place] WITH MYOSURE;  Surgeon: Arvella Nigh, MD;  Location: [facility];  Service: Gynecology;  Laterality: N/A;  . 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  Subjective Assessment - 09/29/18 1400    Currently in Pain?  Other (Comment)   chronic pain        S: "I can be assertive when I need to be"   O: Education and activities given in reference to increase assertiveness skills within daily life and relationships. Assertiveness quiz given to increase insight on pt current skills and how to improve based on given score. Further education given on assertive conversation, situations, body language, and appropriate context for skill. Worksheet given to identify three definitions (assertive, passive, and aggressive) with opportunity for role play between 2 participants in to promote assertiveness training in a variety of social settings (individuals in community and health care providers). Pt asked to identify one area to increase assertiveness this date. Further education given on the importance of assertiveness mentors and online research to continue skill building in this area increase quality of life.   A: Pt presents with blunted affect, was actively engaged throughout entirety of group this date. Pt completed assertiveness quiz showing she is naturally assertive, but continues to need practice to maintain consistency in various contexts. Pt in understanding of education provided, stating she needs to continue working on the skill of saying "no". She claims that she still experiences guilt  when saying no, and feels as if she needs a "good excuse". Pt chose an Multimedia programmer, and plans to continue to practice assertiveness skills in daily life.  P: Pt provided with assertiveness skills to implement into a variety of daily social situations. OT will continue to follow up with communication skills for successful implementation into daily life.                   OT Education - 09/29/18 1400    Education Details  education given on assertiveness communication skills    Person(s) Educated  Patient    Methods   Explanation;Handout    Comprehension  Verbalized understanding       OT Short Term Goals - 09/21/18 1359      OT SHORT TERM GOAL #1   Title  Patient will be educated on strategies to improve psychosocial skills needed to participate fully in all daily, work, and leisure activities.    Time  4    Period  Weeks    Status  On-going    Target Date  10/15/18      OT SHORT TERM GOAL #2   Title  Pt will apply psychosocial skills and coping mechanisms to daily activities in order to function independently and reintegrate into community dwelling    Time  4    Period  Weeks    Status  On-going    Target Date  10/15/18      OT SHORT TERM GOAL #3   Title  Pt will recall and/or apply 1-3 sleep hygiene strategies to improve BADL routine upon reintegrating into community    Time  4    Period  Weeks    Status  On-going    Target Date  10/15/18      OT SHORT TERM GOAL #4   Title  Pt will choose and/or engage in 1-3 socially engaging leisure activities to improve social participation skills upon reintegrating into community    Time  4    Period  Weeks    Status  On-going      OT SHORT TERM GOAL #5   Title  Pt will engage in goal setting to improve functional BADL/IADL routine upon reintegrating into community    Time  4    Period  Weeks    Status  On-going    Target Date  10/15/18               Plan - 09/29/18 1402    Occupational performance deficits (Please refer to evaluation for details):  ADL's;IADL's;Rest and Sleep;Leisure;Social Participation       Patient will benefit from skilled therapeutic intervention in order to improve the following deficits and impairments:  Decreased coping skills, Decreased psychosocial skills, Other (comment)(decreased ability to engage in BADL and reintegrate into community)  Visit Diagnosis: Organic personality disorder  Difficulty coping    Problem List Patient Active Problem List   Diagnosis Date Noted  . Chronic migraine  without aura without status migrainosus, not intractable 09/21/2018  . Bipolar 1 disorder, depressed, severe ([place]) 09/08/2018  . Endometrium, polyp 01/15/2018    Class: Present on Admission  . Fibroids, submucosal 01/15/2018    Class: Present on Admission  . Iron deficiency anemia due to chronic blood loss 09/16/2017  . Menorrhagia with regular cycle   . Symptomatic anemia 08/28/2017  . Acute blood loss anemia 08/28/2017  . Bipolar disorder ([place]) 08/28/2017  . Anxiety 08/28/2017  . Depression 08/28/2017  .  Fibroids 08/28/2017  . PCOS (polycystic ovarian syndrome) 08/28/2017  . Fever 08/28/2017  . HTN (hypertension) 08/28/2017  . Morbid obesity ([place]) 12/04/2015  . Pregnancy with history of uterine myomectomy 04/07/2015  . History of right oophorectomy 02/21/2015  . History of recurrent miscarriages 02/01/2015  . Vitamin D deficiency 02/01/2015    Zenovia Jarred, MSOT, OTR/L Behavioral Health OT/ Acute Relief OT PHP Office: [phone]  Zenovia Jarred 09/29/2018, 2:04 PM  [facility] PARTIAL HOSPITALIZATION PROGRAM [place], [place], [address] Phone: [phone]   Fax:  [phone]  Name: Christine [name] MRN: [id number] Date of Birth: [birth year]

## 2018-09-30 ENCOUNTER — Encounter (HOSPITAL_COMMUNITY): Payer: Self-pay

## 2018-09-30 ENCOUNTER — Other Ambulatory Visit (HOSPITAL_COMMUNITY): Payer: Self-pay

## 2018-09-30 ENCOUNTER — Other Ambulatory Visit (HOSPITAL_COMMUNITY): Payer: Medicare Other | Admitting: Licensed Clinical Social Worker

## 2018-09-30 VITALS — BP 130/80 | HR 93 | Ht 65.0 in | Wt 338.0 lb

## 2018-09-30 DIAGNOSIS — F070 Personality change due to known physiological condition: Secondary | ICD-10-CM | POA: Diagnosis not present

## 2018-09-30 DIAGNOSIS — F314 Bipolar disorder, current episode depressed, severe, without psychotic features: Secondary | ICD-10-CM

## 2018-09-30 DIAGNOSIS — F411 Generalized anxiety disorder: Secondary | ICD-10-CM | POA: Diagnosis not present

## 2018-09-30 DIAGNOSIS — F319 Bipolar disorder, unspecified: Secondary | ICD-10-CM

## 2018-09-30 DIAGNOSIS — Z880 Allergy status to penicillin: Secondary | ICD-10-CM | POA: Diagnosis not present

## 2018-09-30 DIAGNOSIS — E282 Polycystic ovarian syndrome: Secondary | ICD-10-CM | POA: Diagnosis not present

## 2018-09-30 DIAGNOSIS — Z79899 Other long term (current) drug therapy: Secondary | ICD-10-CM | POA: Diagnosis not present

## 2018-09-30 NOTE — Progress Notes (Addendum)
GROUP NOTE - spiritual care group 09/30/2018 11:00 - 12:00 ?Facilitated by Counseling intern Doris Cheadle and [place], MDiv, [facility].  ? ? Group focused on topic of strength. ?Group members reflected on what thoughts and feelings emerge when they hear this topic. ?They then engaged in facilitated dialog around how strength is present in their lives. This dialog focused on representing what strength had been to them in their lives (images and patterns given) and what they saw as helpful in their life now (what they needed / wanted). ? ? Activity drew on narrative framework   Was alert and present throughout group. Voluntarily engaged with group members and facilitators throughout. She expressed that she has previously understood strength as "not falling down" and being able to manage life roles and responsibilities without any noticeable effects on her well-being. After engaging in the art activity, she shared that she is working on allowing herself to experience her emotions, identifying that she is trying to reframe this as strength

## 2018-09-30 NOTE — Progress Notes (Signed)
Patient presented with sad affect, depressed mood and reported over the past 4-5 days she has just felt "more down and depressed".  Patient sated she has only been sleeping 4-5 hours a night, that her thoughts are racing and all over the place and she just has no motivation or desire to do anything. Patient stated "there is nothing that I enjoy" currently and admits feeling this way is frustrating.  Patient reported she finished prednisone for lung infection over the past week, will see her PCP in another week for follow up and that they plan to refer her to a pulmonologist due to multiple lung infections over the past 2 months.  Patient reviewed all current medications with this nurse and encouraged her to try Hydroxyzine 50 mg at bedtime as Dr. Dwyane Dee had requested to help with relaxing at night and sleep.  Patient reported her Lithium dosage was lowered when she was inpatient after her level was 0.85 on 09/08/2018 and questioned had her mood been spiraling since that time.  Patient admitted it had and agreed to discuss this with Ricky Ala, NP who will see patient today.  Patient denied any suicidal or homicidal ideations, no plan or intent to want to harm self or others and no auditory or visual hallucinations but does admit she is feeling more depressed and is tired of cycling as she described with her present mood. Patient agreed with plan to meet with NP to discuss medications further today and to try hydroxyzine at night as was suggested by MD.  Patient agreed to inform this nurse or PHP staff if any worsening of symptoms or if she began to have suicidal ideations with plans and/or intent as she again states this is not the case at present.  Patient tearing up more today and rated her current level of depression an 8, anxiety a 7, and hopelessness an 8 on a scale of 0-10 with 0 being none and 10 the worst she could manage.  Patient returned to [facility] group and then discussed concerns for patient's increased  depressive presentation today with Ricky Ala, NP who agreed to review patient's medications and to follow up with patient.

## 2018-09-30 NOTE — Progress Notes (Signed)
BH MD/PA/NP OP Progress Note  10/03/2018 6:20 PM Christine [name]  MRN:  [id number]   Evaluation: Christine [name] observed attending daily group sessions. Patient reports a history of bipolar, depression and anxiety. presents flat, guarded and tearful.  Continues to express mood fluctuations with irritability and worsening depression.  Patient was recently discharged from inpatient admission with medication adjustments.  She reports lithium was titrated downward and she was unsure why.  States she was initiated on Abilify 10 mg however reports she does not feel like the medications is helping with her symptoms.  Reports she was discontinued from [place] due to insurance reasons. However reports feeling better on [name]. NP consulted with attending psychiatrist [name]. Will collect lithium level- pending results. Will provide [name] samples pending insurance verification.  Patient encouraged to utilize Vistaril for anxiety as indicated on discharge summary.  Patient reports she has not been taking Vistaril for her anxiety. Patient was aggreable to plan.  Support encouragement reassurance was provided   History: Per admission assessment note: Patient is a42 y.o.African Americanfemalewith bipolar disorder, most recent episode depressed, severe who presents withdepression and anxiety. Patientrecently discharged on 09/12/2018 from inpatient behavioral health hospital. Patient was enrolled in partial psychiatric program on 2/12/2020Patient reports that she continues to struggle with her mood, knows that she needs to work on her coping skills, gets overwhelmed easily, feels isolated. Patient has that she does not have much of a social life, knows that she needs to join a church but has not done so. Patient has that her husband is supportive and states that she loves her 2 kids. Patient has that she is no longer having suicidal thoughts since her discharge from the hospital, feels she needs to work in a program so  she is not suicidal again and has improvement in her depression and anxiety. On a scale of 0-10, with 0 being no symptoms and 10being the worst, patient reports that her depression is an 8 out of 10 and her anxiety is a 7 out of 10 as she worries about her not getting better. Patient denies any symptoms of mania, any substance use   Visit Diagnosis:    ICD-10-CM   1. Bipolar 1 disorder, mixed, moderate (HCC) F31.62     Past Psychiatric History:   Past Medical History:  Past Medical History:  Diagnosis Date  . Anemia   . Anxiety   . Arthritis    Hands, ankles  . Bipolar 1 disorder ([place])   . Depression   . Depression   . Fibroids   . Headache   . High blood pressure   . History of blood transfusion 08/2017   [facility]  . History of hiatal hernia   . Mental disorder   . Migraines   . PCOS (polycystic ovarian syndrome)   . Seasonal allergies     Past Surgical History:  Procedure Laterality Date  . CERVICAL POLYPECTOMY  01/15/2018   Procedure: CERVICAL POLYPECTOMY;  Surgeon: Arvella Nigh, MD;  Location: [facility];  Service: Gynecology;;  . COLONOSCOPY  2008   Normal  . DIAGNOSTIC LAPAROSCOPY  08/1999   dermoid cyst, RSO  . DILATION AND CURETTAGE OF UTERUS  05/2004   MAB  . DILATION AND CURETTAGE OF UTERUS  04/2015  . HYSTEROSCOPY W/D&C  07/22/2011   Procedure: DILATATION AND CURETTAGE /HYSTEROSCOPY;  Surgeon: Cyril Mourning, MD;  Location: [facility];  Service: Gynecology;;  . HYSTEROSCOPY W/D&C N/A 01/15/2018   Procedure: DILATATION AND CURETTAGE /HYSTEROSCOPY WITH MYOSURE;  Surgeon: Radene Knee,  Jenny Reichmann, MD;  Location: [facility];  Service: Gynecology;  Laterality: N/A;  . 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 Anxiety disorder Mother   . Depression Mother   . Cancer Mother        pancreatic   . High blood pressure Mother   . Cancer Father        colon  . High blood pressure  Father   . Diabetes Father   . Cancer Brother 6       leukemia, childhood  . High blood pressure Brother   . Migraines Neg Hx     Social History:  Social History   Socioeconomic History  . Marital status: Married    Spouse name: Not on file  . Number of children: 2  . Years of education: 2 years of grad school + bach & HS  . Highest education level: Bachelor's degree (e.g., BA, AB, BS)  Occupational History  . Not on file  Social Needs  . Financial resource strain: Somewhat hard  . Food insecurity:    Worry: Sometimes true    Inability: Sometimes true  . Transportation needs:    Medical: No    Non-medical: No  Tobacco Use  . Smoking status: Never Smoker  . Smokeless tobacco: Never Used  Substance and Sexual Activity  . Alcohol use: Yes    Comment: occasional, maybe once a month or so  . Drug use: No  . Sexual activity: Yes    Partners: Male    Birth control/protection: None  Lifestyle  . Physical activity:    Days per week: 0 days    Minutes per session: Not on file  . Stress: Rather much  Relationships  . Social connections:    Talks on phone: Never    Gets together: Never    Attends religious service: Never    Active member of club or organization: No    Attends meetings of clubs or organizations: Never    Relationship status: Not on file  Other Topics Concern  . Not on file  Social History Narrative   Lives at home with her husband and daughters   Right handed   Caffeine: decreased intake, drinks rarely     Allergies:  Allergies  Allergen Reactions  . Penicillins Rash and Other (See Comments)    Has patient had a PCN reaction causing immediate rash, facial/tongue/throat swelling, SOB or lightheadedness with hypotension: Yes Has patient had a PCN reaction causing severe rash involving mucus membranes or skin necrosis: Unknown Has patient had a PCN reaction that required hospitalization: No Has patient had a PCN reaction occurring within the last 10  years: No If all of the above answers are "NO", then may proceed with Cephalosporin use.     Metabolic Disorder Labs: No results found for: HGBA1C, MPG No results found for: PROLACTIN No results found for: CHOL, TRIG, HDL, CHOLHDL, VLDL, LDLCALC Lab Results  Component Value Date   TSH 1.460 04/28/2018    Therapeutic Level Labs: Lab Results  Component Value Date   LITHIUM 0.4 (L) 10/02/2018   LITHIUM 0.85 09/08/2018   No results found for: VALPROATE No components found for:  CBMZ  Current Medications: Current Outpatient Medications  Medication Sig Dispense Refill  . acetaminophen (TYLENOL) 325 MG tablet Take 2 tablets (650 mg total) by mouth every 6 (six) hours as needed for  mild pain or moderate pain. (May buy over the counter) 1 tablet 0  . Albuterol Sulfate (PROAIR HFA IN) Inhale into the lungs every 6 (six) hours as needed. 1 puff every 6 hours PRN    . amLODipine (NORVASC) 10 MG tablet Take 1 tablet (10 mg total) by mouth daily. 30 tablet 0  . ARIPiprazole (ABILIFY) 10 MG tablet Take 1 tablet (10 mg total) by mouth daily. For mood 30 tablet 1  . lamoTRIgine (LAMICTAL) 150 MG tablet Take 1 tablet (150 mg total) by mouth 2 (two) times daily. For mood 60 tablet 1  . lithium 600 MG capsule Take 1 capsule (600 mg total) by mouth 2 (two) times daily with a meal. For mood 60 capsule 1  . metoprolol succinate (TOPROL-XL) 50 MG 24 hr tablet Take 50 mg by mouth daily. Take with or immediately following a meal.    . naproxen sodium (ALEVE) 220 MG tablet Take 440 mg by mouth daily as needed (pain).    . ondansetron (ZOFRAN-ODT) 4 MG disintegrating tablet Take 1 tablet (4 mg total) by mouth every 8 (eight) hours as needed for nausea. 30 tablet 3  . rizatriptan (MAXALT-MLT) 10 MG disintegrating tablet Take 1 tablet (10 mg total) by mouth as needed for migraine. May repeat in 2 hours if needed 9 tablet 11  . Erenumab-aooe (AIMOVIG) 140 MG/ML SOAJ Inject 140 mg into the skin every 30 (thirty)  days. (Patient not taking: Reported on 09/24/2018) 1 pen 11  . hydrOXYzine (ATARAX/VISTARIL) 25 MG tablet Take 1 tablet (25 mg total) by mouth 3 (three) times daily as needed for anxiety. (Patient not taking: Reported on 09/30/2018) 60 tablet 1  . Melatonin 3 MG TABS Take 1 tablet (3 mg total) by mouth at bedtime as needed (sleep). (Patient not taking: Reported on 09/30/2018) 30 tablet 1   No current facility-administered medications for this visit.      Musculoskeletal: Strength & Muscle Tone: within normal limits Gait & Station: normal Patient leans: N/A  Psychiatric Specialty Exam: Review of Systems  Psychiatric/Behavioral: Positive for depression. Negative for suicidal ideas. The patient is nervous/anxious and has insomnia.   All other systems reviewed and are negative.   Blood pressure 130/80, pulse 93, height 5\' 5"  (1.651 m), weight (!) 338 lb (153.3 kg), last menstrual period 09/09/2018, SpO2 96 %.Body mass index is 56.25 kg/m.  General Appearance: Guarded  Eye Contact:  Fair  Speech:  Clear and Coherent  Volume:  Normal  Mood:  Anxious and Depressed  Affect:  Congruent  Thought Process:  Coherent  Orientation:  Full (Time, Place, and Person)  Thought Content: Logical   Suicidal Thoughts:  No  Homicidal Thoughts:  No  Memory:  Immediate;   Fair Recent;   Fair Remote;   Fair  Judgement:  Fair  Insight:  Fair  Psychomotor Activity:  Restlessness  Concentration:  Concentration: Fair  Recall:  [employer] of Knowledge: Fair  Language: Fair  Akathisia:  No  Handed:  Right  AIMS (if indicated):   Assets:  Communication Skills Desire for Improvement Resilience Social Support  ADL's:  Intact  Cognition: WNL  Sleep:  Fair   Screenings: AIMS     Admission (Discharged) from 09/08/2018 in [facility]  AIMS Total Score  0    AUDIT     Admission (Discharged) from 09/08/2018 in [facility]  Alcohol Use  Disorder Identification Test Final Score (AUDIT)  1  GAD-7     Counselor from 09/09/2017 in [facility] Counselor from 04/04/2017 in [facility] Counselor from 03/18/2017 in [facility]  Total GAD-7 Score  16  7  18     PHQ2-9     Counselor from 09/16/2018 in [facility] Counselor from 09/26/2017 in [facility] Counselor from 09/19/2017 in [facility] Counselor from 09/09/2017 in [place] Counselor from 04/04/2017 in [facility]  PHQ-2 Total Score  6  3  4  6  2   PHQ-9 Total Score  16  12  16  21  10        Assessment and Plan:  Continue partial hospitalization program  Bipolar/ Depression:  Patient to continue Lamictal 150 p.o. twice daily Continue Abilify 10 mg p.o. daily, Lithium  600 mg p.o. daily  Orders placed for Li level- will consider titration pending results.  MD [name]  provided medication samples for [name] - ( pending approval with insurance coverage.)   Restart Vistaril 25 mg -50 mg PRN for anxiety   Anticipated step down to IOP 3/5 or 3/6 - will continue to follow-up for medication management   Treatment plan was reviewed and agreed upon by NP T. Bobby Rumpf and patient Christine [name] need for continued group services   [place], NP 10/03/2018, 6:20 PM

## 2018-10-01 ENCOUNTER — Encounter (HOSPITAL_COMMUNITY): Payer: Self-pay | Admitting: Occupational Therapy

## 2018-10-01 ENCOUNTER — Other Ambulatory Visit (HOSPITAL_COMMUNITY): Payer: Medicare Other | Admitting: Occupational Therapy

## 2018-10-01 ENCOUNTER — Other Ambulatory Visit (HOSPITAL_COMMUNITY): Payer: Medicare Other | Admitting: Licensed Clinical Social Worker

## 2018-10-01 ENCOUNTER — Encounter (INDEPENDENT_AMBULATORY_CARE_PROVIDER_SITE_OTHER): Payer: BC Managed Care – PPO

## 2018-10-01 DIAGNOSIS — F411 Generalized anxiety disorder: Secondary | ICD-10-CM

## 2018-10-01 DIAGNOSIS — Z79899 Other long term (current) drug therapy: Secondary | ICD-10-CM | POA: Diagnosis not present

## 2018-10-01 DIAGNOSIS — F070 Personality change due to known physiological condition: Secondary | ICD-10-CM | POA: Diagnosis not present

## 2018-10-01 DIAGNOSIS — E282 Polycystic ovarian syndrome: Secondary | ICD-10-CM | POA: Diagnosis not present

## 2018-10-01 DIAGNOSIS — Z880 Allergy status to penicillin: Secondary | ICD-10-CM | POA: Diagnosis not present

## 2018-10-01 DIAGNOSIS — F314 Bipolar disorder, current episode depressed, severe, without psychotic features: Secondary | ICD-10-CM | POA: Diagnosis not present

## 2018-10-01 DIAGNOSIS — R4589 Other symptoms and signs involving emotional state: Secondary | ICD-10-CM

## 2018-10-01 NOTE — Therapy (Signed)
[place], [place], [address] Phone: [phone]   Fax:  [phone]  Occupational Therapy Treatment  Patient Details  Name: Christine [name] MRN: [id number] Date of Birth: 1976-01-03 Referring Provider (OT): Maudry Mayhew   Encounter Date: 10/01/2018  OT End of Session - 10/01/18 1440    Visit Number  9    Number of Visits  16    Date for OT Re-Evaluation  10/15/18    Authorization Type  BCBS    OT Start Time  1100    OT Stop Time  1200    OT Time Calculation (min)  60 min    Activity Tolerance  Patient tolerated treatment well    Behavior During Therapy  [facility] for tasks assessed/performed       Past Medical History:  Diagnosis Date  . Anemia   . Anxiety   . Arthritis    Hands, ankles  . Bipolar 1 disorder ([place])   . Depression   . Depression   . Fibroids   . Headache   . High blood pressure   . History of blood transfusion 08/2017   [facility]  . History of hiatal hernia   . Mental disorder   . Migraines   . PCOS (polycystic ovarian syndrome)   . Seasonal allergies     Past Surgical History:  Procedure Laterality Date  . CERVICAL POLYPECTOMY  01/15/2018   Procedure: CERVICAL POLYPECTOMY;  Surgeon: Arvella Nigh, MD;  Location: [facility];  Service: Gynecology;;  . COLONOSCOPY  2008   Normal  . DIAGNOSTIC LAPAROSCOPY  08/1999   dermoid cyst, RSO  . DILATION AND CURETTAGE OF UTERUS  05/2004   MAB  . DILATION AND CURETTAGE OF UTERUS  04/2015  . HYSTEROSCOPY W/D&C  07/22/2011   Procedure: DILATATION AND CURETTAGE /HYSTEROSCOPY;  Surgeon: Cyril Mourning, MD;  Location: [facility];  Service: Gynecology;;  . HYSTEROSCOPY W/D&C N/A 01/15/2018   Procedure: DILATATION AND CURETTAGE [place] WITH MYOSURE;  Surgeon: Arvella Nigh, MD;  Location: [facility];  Service: Gynecology;  Laterality: N/A;  . 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  Subjective Assessment - 10/01/18 1439    Currently in Pain?  Other (Comment)   chronic pain      S: "I have difficulty controlling my facial expressions"   O: Education given on communication skills, breaking down into three parts: nonverbal, verbal, and listening. Nonverbal communication discussed in detail this date in regard to definition, appropriate nonverbals, and how to improve personal nonverbals. Video clip used to display variety of nonverbals to facilitate further discussion. Nonverbal communication continually described in reference to electronic communication. Pt encouraged to share personal strengths and weaknesses to improve current communication skills to increase independent community reintegration. Verbal and listening communication skills to be continued next date.   A: Pt presents to group with blunted affect, engaged and participatory throughout session. Pt in understanding of education given this date. She shares that she has difficulty managing her facial expressions often times, sending others the wrong or conflicting messages. She shares she would like to work on this. Pt did not offer further areas for improvement.   P: Pt provided with communication skills to implement when reintegrating into community dwelling. OT will continue follow up with communication skills for successful implementation in daily life.  OT Education - 10/01/18 1439    Education Details  education given on nonverbal communication skills    Person(s) Educated  Patient    Methods  Explanation;Handout    Comprehension  Verbalized understanding       OT Short Term Goals - 09/21/18 1359      OT SHORT TERM GOAL #1   Title  Patient will be educated on strategies to improve psychosocial skills needed to participate fully in all daily, work, and leisure activities.    Time  4    Period  Weeks    Status  On-going    Target Date  10/15/18      OT SHORT  TERM GOAL #2   Title  Pt will apply psychosocial skills and coping mechanisms to daily activities in order to function independently and reintegrate into community dwelling    Time  4    Period  Weeks    Status  On-going    Target Date  10/15/18      OT SHORT TERM GOAL #3   Title  Pt will recall and/or apply 1-3 sleep hygiene strategies to improve BADL routine upon reintegrating into community    Time  4    Period  Weeks    Status  On-going    Target Date  10/15/18      OT SHORT TERM GOAL #4   Title  Pt will choose and/or engage in 1-3 socially engaging leisure activities to improve social participation skills upon reintegrating into community    Time  4    Period  Weeks    Status  On-going      OT SHORT TERM GOAL #5   Title  Pt will engage in goal setting to improve functional BADL/IADL routine upon reintegrating into community    Time  4    Period  Weeks    Status  On-going    Target Date  10/15/18               Plan - 10/01/18 1440    Occupational performance deficits (Please refer to evaluation for details):  ADL's;IADL's;Rest and Sleep;Leisure;Social Participation       Patient will benefit from skilled therapeutic intervention in order to improve the following deficits and impairments:  Decreased coping skills, Decreased psychosocial skills, Other (comment)(decreased ability to engage in BADL and reintegrate into community)  Visit Diagnosis: Organic personality disorder  Difficulty coping    Problem List Patient Active Problem List   Diagnosis Date Noted  . Chronic migraine without aura without status migrainosus, not intractable 09/21/2018  . Bipolar 1 disorder, depressed, severe ([place]) 09/08/2018  . Endometrium, polyp 01/15/2018    Class: Present on Admission  . Fibroids, submucosal 01/15/2018    Class: Present on Admission  . Iron deficiency anemia due to chronic blood loss 09/16/2017  . Menorrhagia with regular cycle   . Symptomatic anemia  08/28/2017  . Acute blood loss anemia 08/28/2017  . Bipolar disorder ([place]) 08/28/2017  . Anxiety 08/28/2017  . Depression 08/28/2017  . Fibroids 08/28/2017  . PCOS (polycystic ovarian syndrome) 08/28/2017  . Fever 08/28/2017  . HTN (hypertension) 08/28/2017  . Morbid obesity ([place]) 12/04/2015  . Pregnancy with history of uterine myomectomy 04/07/2015  . History of right oophorectomy 02/21/2015  . History of recurrent miscarriages 02/01/2015  . Vitamin D deficiency 02/01/2015   Zenovia Jarred, MSOT, OTR/L Behavioral Health OT/ Acute Relief OT PHP Office: [phone]  Zenovia Jarred 10/01/2018, 2:41 PM  [place]  [place], [place], [address] Phone: [phone]   Fax:  [phone]  Name: Christine [name] MRN: [id number] Date of Birth: 16-Nov-1975

## 2018-10-02 ENCOUNTER — Encounter (HOSPITAL_COMMUNITY): Payer: Self-pay | Admitting: Occupational Therapy

## 2018-10-02 ENCOUNTER — Other Ambulatory Visit (HOSPITAL_COMMUNITY): Payer: Self-pay

## 2018-10-02 ENCOUNTER — Other Ambulatory Visit (HOSPITAL_COMMUNITY): Payer: Medicare Other | Admitting: Licensed Clinical Social Worker

## 2018-10-02 ENCOUNTER — Other Ambulatory Visit (HOSPITAL_COMMUNITY): Payer: Medicare Other | Admitting: Occupational Therapy

## 2018-10-02 DIAGNOSIS — Z79899 Other long term (current) drug therapy: Secondary | ICD-10-CM | POA: Diagnosis not present

## 2018-10-02 DIAGNOSIS — F314 Bipolar disorder, current episode depressed, severe, without psychotic features: Secondary | ICD-10-CM | POA: Diagnosis not present

## 2018-10-02 DIAGNOSIS — R4589 Other symptoms and signs involving emotional state: Secondary | ICD-10-CM

## 2018-10-02 DIAGNOSIS — F070 Personality change due to known physiological condition: Secondary | ICD-10-CM | POA: Diagnosis not present

## 2018-10-02 DIAGNOSIS — F319 Bipolar disorder, unspecified: Secondary | ICD-10-CM | POA: Diagnosis not present

## 2018-10-02 DIAGNOSIS — E282 Polycystic ovarian syndrome: Secondary | ICD-10-CM | POA: Diagnosis not present

## 2018-10-02 DIAGNOSIS — F411 Generalized anxiety disorder: Secondary | ICD-10-CM

## 2018-10-02 DIAGNOSIS — Z880 Allergy status to penicillin: Secondary | ICD-10-CM | POA: Diagnosis not present

## 2018-10-02 NOTE — Psych (Signed)
   [facility] BH PHP THERAPIST PROGRESS NOTE  Christine [name] [id number]  Session Time: 9:00 - 11:00  Participation Level: Active  Behavioral Response: CasualAlertAnxiousDepressed  Type of Therapy: Group Therapy; Psychotherapy  Treatment Goals addressed: Coping  Interventions: CBT, DBT, Solution Focused, Supportive and Reframing  Summary: Clinician led check-in regarding current stressors and situation, and review of patient completed daily inventory. Clinician utilized active listening and empathetic response and validated patient emotions. Clinician facilitated processing group on pertinent issues.  Therapist Response: Patient arrived within time allowed and reports she is feeling "on edge." Pt rates her mood at a 3 on a scale of 1-10 with 10 being great. Pt reports her weekend was "not great" and that she found out on Friday that she has bronchitis again. Pt reports she is on antibiotics now and felt low energy, sluggish, and very tired throughout the weekend. Pt states it was difficult to manage her daughters and the house while feeling this way. Pt states her husband could have helped but she didn't ask and he did not initiate. Pt continues to struggle with utilizing her resources and setting boundaries. Patient engaged in discussion.        Session Time: 11:00 -12:00   Participation Level: Active   Behavioral Response: CasualAlertDepressed   Type of Therapy: Group Therapy, OT   Treatment Goals addressed: Coping   Interventions: Psychosocial skills training, Supportive,    Summary:  Occupational Therapy group   Therapist Response: Patient engaged in group. See OT note.            Session Time: 12:00 - 12:45  Participation Level: Active  Behavioral Response: CasualAlertDepressed  Type of Therapy: Group Therapy, Activity Therapy  Treatment Goals addressed: Coping  Interventions: Systems analyst, Supportive  Summary:  Reflection Group: Patients  encouraged to practice skills and interpersonal techniques or work on mindfulness and relaxation techniques. The importance of self-care and making skills part of a routine to increase usage were stressed   Therapist Response: Patient engaged and participated appropriately.     **Pt chose to leave group at 76:73 due to a conflicting neurology appointment. Pt denies SI/HI before leaving.       Suicidal/Homicidal: Nowithout intent/plan  Plan: Patient will continue in PHP while working on decreasing depression and anxiety symptoms and SI, and increasing ability to manage symptoms in a healthy manner.   Diagnosis: Bipolar 1 disorder, depressed, severe ([place]) [F31.4]    1. Bipolar 1 disorder, depressed, severe ([place])     [name], LCSW 10/02/2018

## 2018-10-02 NOTE — Psych (Signed)
[facility] BH PHP THERAPIST PROGRESS NOTE  Christine [name] [id number]  Session Time: 9:00 - 11:00  Participation Level: Active  Behavioral Response: CasualAlertAnxiousDepressed  Type of Therapy: Group Therapy; Psychotherapy  Treatment Goals addressed: Coping  Interventions: CBT, DBT, Solution Focused, Supportive and Reframing  Summary: Clinician led check-in regarding current stressors and situation, and review of patient completed daily inventory. Clinician utilized active listening and empathetic response and validated patient emotions. Clinician facilitated processing group on pertinent issues.  Therapist Response: Patient arrived within time allowed and reports feeling "mixed". Pt rates her mood at a 4 on a scale of 1-10 with 10 being great. Pt reports yesterday afternoon was difficult emotionally, and also some positive things happened. Pt reports her neurologist appointment went well and she feels hopeful about treatment for her migraines. Pt states she discussed with a friend how helpful she was finding a meditation app and the friend gifted pt a year subscription which pt was touched by. Pt states she spiraled later in the evening thinking about her family. Pt states she has continued to avoid her brother's outreach and was realizing how much she has been hurt by her family since mom's death. Pt states she feels it has confused her grief process and makes her angry. Pt reports feeling as though her family do not think she is entitled to grieve as much as they are. Patient is struggling with processing her grief. Patient engaged in discussion.        Session Time: 11:00 -12:00   Participation Level: Active   Behavioral Response: CasualAlertDepressed   Type of Therapy: Group Therapy, OT   Treatment Goals addressed: Coping   Interventions: Psychosocial skills training, Supportive,    Summary:  Occupational Therapy group   Therapist Response: Patient engaged in group. See OT  note.            Session Time: 12:00 - 12:45  Participation Level: Active  Behavioral Response: CasualAlertDepressed  Type of Therapy: Group Therapy, Activity Therapy  Treatment Goals addressed: Coping  Interventions: Systems analyst, Supportive  Summary:  Reflection Group: Patients encouraged to practice skills and interpersonal techniques or work on mindfulness and relaxation techniques. The importance of self-care and making skills part of a routine to increase usage were stressed   Therapist Response: Patient engaged and participated appropriately.          Session Time: 12:45 - 2:00  Participation Level: Active  Behavioral Response: CasualAlertAnxiousDepressed  Type of Therapy: Group Therapy; Psychotherapy; Psychoeducation  Treatment Goals addressed: Coping  Interventions: CBT, DBT, Solution Focused, Supportive and Reframing  Summary:12:45 - 1:50 Cln introduced topic of distress tolerance reviewing their purpose and how they are used. Cln introduced ACCEPTS skill. Group discussed "A-C-C" and how they can utilize the skills in the present moment.    1:50 - 12:00 Clinician led check-out. Clinician assessed for immediate needs, medication compliance and efficacy, and safety concerns.   Therapist Response: Patient engaged in group. Pt identified knitting and listening to music as ways to practice "A," activities.   At [place], patient rates her mood at a 5 on a scale of 1-10 with 10 being great. Patient reports afternoon plans to pick up prescriptions and try to practice self care. Patient demonstrates some progress as evidenced by increasing insight into feelings about her mother's death. Patient denies SI/HI/self-harm at the end of group.      Suicidal/Homicidal: Nowithout intent/plan  Plan: Patient will continue in PHP while working on decreasing depression and anxiety symptoms  and SI, and increasing ability to manage symptoms in a healthy  manner.   Diagnosis: Bipolar 1 disorder, depressed, severe ([place]) [F31.4]    1. Bipolar 1 disorder, depressed, severe ([place])     [name], LCSW 10/02/2018

## 2018-10-02 NOTE — Therapy (Signed)
[place], [place], [address] Phone: [phone]   Fax:  [phone]  Occupational Therapy Treatment  Patient Details  Name: Christine [name] MRN: [id number] Date of Birth: 1976/02/18 Referring Provider (OT): Maudry Mayhew   Encounter Date: 10/02/2018  OT End of Session - 10/02/18 1321    Visit Number  10    Number of Visits  16    Date for OT Re-Evaluation  10/15/18    Authorization Type  BCBS    OT Start Time  1100    OT Stop Time  1200    OT Time Calculation (min)  60 min    Activity Tolerance  Patient tolerated treatment well    Behavior During Therapy  [facility] for tasks assessed/performed       Past Medical History:  Diagnosis Date  . Anemia   . Anxiety   . Arthritis    Hands, ankles  . Bipolar 1 disorder ([place])   . Depression   . Depression   . Fibroids   . Headache   . High blood pressure   . History of blood transfusion 08/2017   [facility]  . History of hiatal hernia   . Mental disorder   . Migraines   . PCOS (polycystic ovarian syndrome)   . Seasonal allergies     Past Surgical History:  Procedure Laterality Date  . CERVICAL POLYPECTOMY  01/15/2018   Procedure: CERVICAL POLYPECTOMY;  Surgeon: Arvella Nigh, MD;  Location: [facility];  Service: Gynecology;;  . COLONOSCOPY  2008   Normal  . DIAGNOSTIC LAPAROSCOPY  08/1999   dermoid cyst, RSO  . DILATION AND CURETTAGE OF UTERUS  05/2004   MAB  . DILATION AND CURETTAGE OF UTERUS  04/2015  . HYSTEROSCOPY W/D&C  07/22/2011   Procedure: DILATATION AND CURETTAGE /HYSTEROSCOPY;  Surgeon: Cyril Mourning, MD;  Location: [facility];  Service: Gynecology;;  . HYSTEROSCOPY W/D&C N/A 01/15/2018   Procedure: DILATATION AND CURETTAGE [place] WITH MYOSURE;  Surgeon: Arvella Nigh, MD;  Location: [facility];  Service: Gynecology;  Laterality: N/A;  . 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  Subjective Assessment - 10/02/18 1321    Currently in Pain?  Other (Comment)   chronic pain        S: "I often times talk too fast and need to use more silence, although it makes me uncomfortable"   O: OT treatment focus on communication skills, with emphasis of session on active listening. Ice breaker activity given with focus of active listening in pairs given at start of session. Pt received handout to guide understanding of definition of active listening and how to improve skills this date. Pt asked to share personal experiences and examples throughout session. Active listening activity completed with emphasis on one peer speaking with another summarizing without bias or opinion. Video clip shown to visualize a real life example of an active listening situation. Discussion facilitated at end of session.  A: Pt presented to group with blunted affect, engaged and participatory throughout session. Pt engaged in ice breaker, needing min-mod VC's to complete activity successfully. Pt received active listening handout, in understanding of ways to improve current practices. Pt wanting to use the skill "silence" to increase the amount she uses silence. She shares she often times is a fast talker and by using silence and effective pauses, her overall sense of communication would improve. Pt  showed understanding of video clip shown.   P: Pt provided with communication skills to implement when reintegrating into community dwelling. OT will continue follow up with communication skills for successful implementation in daily life                     OT Education - 10/02/18 1321    Education Details  education given on active listening communication skills    Person(s) Educated  Patient    Methods  Explanation;Handout    Comprehension  Verbalized understanding       OT Short Term Goals - 09/21/18 1359      OT SHORT TERM GOAL #1   Title  Patient will be educated on  strategies to improve psychosocial skills needed to participate fully in all daily, work, and leisure activities.    Time  4    Period  Weeks    Status  On-going    Target Date  10/15/18      OT SHORT TERM GOAL #2   Title  Pt will apply psychosocial skills and coping mechanisms to daily activities in order to function independently and reintegrate into community dwelling    Time  4    Period  Weeks    Status  On-going    Target Date  10/15/18      OT SHORT TERM GOAL #3   Title  Pt will recall and/or apply 1-3 sleep hygiene strategies to improve BADL routine upon reintegrating into community    Time  4    Period  Weeks    Status  On-going    Target Date  10/15/18      OT SHORT TERM GOAL #4   Title  Pt will choose and/or engage in 1-3 socially engaging leisure activities to improve social participation skills upon reintegrating into community    Time  4    Period  Weeks    Status  On-going      OT SHORT TERM GOAL #5   Title  Pt will engage in goal setting to improve functional BADL/IADL routine upon reintegrating into community    Time  4    Period  Weeks    Status  On-going    Target Date  10/15/18               Plan - 10/02/18 1322    Occupational performance deficits (Please refer to evaluation for details):  ADL's;IADL's;Rest and Sleep;Leisure;Social Participation       Patient will benefit from skilled therapeutic intervention in order to improve the following deficits and impairments:  Decreased coping skills, Decreased psychosocial skills, Other (comment)(decreased ability to engage in BADL and reintegrate into community)  Visit Diagnosis: Organic personality disorder  Difficulty coping    Problem List Patient Active Problem List   Diagnosis Date Noted  . Chronic migraine without aura without status migrainosus, not intractable 09/21/2018  . Bipolar 1 disorder, depressed, severe ([place]) 09/08/2018  . Endometrium, polyp 01/15/2018    Class: Present on  Admission  . Fibroids, submucosal 01/15/2018    Class: Present on Admission  . Iron deficiency anemia due to chronic blood loss 09/16/2017  . Menorrhagia with regular cycle   . Symptomatic anemia 08/28/2017  . Acute blood loss anemia 08/28/2017  . Bipolar disorder ([place]) 08/28/2017  . Anxiety 08/28/2017  . Depression 08/28/2017  . Fibroids 08/28/2017  . PCOS (polycystic ovarian syndrome) 08/28/2017  . Fever 08/28/2017  . HTN (hypertension) 08/28/2017  . Morbid  obesity ([place]) 12/04/2015  . Pregnancy with history of uterine myomectomy 04/07/2015  . History of right oophorectomy 02/21/2015  . History of recurrent miscarriages 02/01/2015  . Vitamin D deficiency 02/01/2015   Zenovia Jarred, MSOT, OTR/L Behavioral Health OT/ Acute Relief OT PHP Office: [phone]  Zenovia Jarred 10/02/2018, 1:23 PM  [facility] HOSPITALIZATION PROGRAM [place], [place], [address] Phone: [phone]   Fax:  [phone]  Name: Nashonda Limberg MRN: [id number] Date of Birth: [birth year]

## 2018-10-03 ENCOUNTER — Encounter (HOSPITAL_COMMUNITY): Payer: Self-pay | Admitting: Family

## 2018-10-03 LAB — LITHIUM LEVEL: Lithium Lvl: 0.4 mmol/L — ABNORMAL LOW (ref 0.6–1.2)

## 2018-10-05 ENCOUNTER — Other Ambulatory Visit (HOSPITAL_COMMUNITY): Payer: Medicare Other | Attending: Psychiatry | Admitting: Licensed Clinical Social Worker

## 2018-10-05 ENCOUNTER — Other Ambulatory Visit (HOSPITAL_COMMUNITY): Payer: Medicare Other | Admitting: Occupational Therapy

## 2018-10-05 ENCOUNTER — Encounter (HOSPITAL_COMMUNITY): Payer: Self-pay | Admitting: Occupational Therapy

## 2018-10-05 DIAGNOSIS — F070 Personality change due to known physiological condition: Secondary | ICD-10-CM

## 2018-10-05 DIAGNOSIS — Z7901 Long term (current) use of anticoagulants: Secondary | ICD-10-CM | POA: Diagnosis not present

## 2018-10-05 DIAGNOSIS — R030 Elevated blood-pressure reading, without diagnosis of hypertension: Secondary | ICD-10-CM | POA: Insufficient documentation

## 2018-10-05 DIAGNOSIS — R45851 Suicidal ideations: Secondary | ICD-10-CM | POA: Insufficient documentation

## 2018-10-05 DIAGNOSIS — R4587 Impulsiveness: Secondary | ICD-10-CM | POA: Diagnosis not present

## 2018-10-05 DIAGNOSIS — R4589 Other symptoms and signs involving emotional state: Secondary | ICD-10-CM

## 2018-10-05 DIAGNOSIS — F419 Anxiety disorder, unspecified: Secondary | ICD-10-CM | POA: Insufficient documentation

## 2018-10-05 DIAGNOSIS — M797 Fibromyalgia: Secondary | ICD-10-CM | POA: Insufficient documentation

## 2018-10-05 DIAGNOSIS — F411 Generalized anxiety disorder: Secondary | ICD-10-CM

## 2018-10-05 DIAGNOSIS — Z79899 Other long term (current) drug therapy: Secondary | ICD-10-CM | POA: Insufficient documentation

## 2018-10-05 DIAGNOSIS — R600 Localized edema: Secondary | ICD-10-CM | POA: Diagnosis not present

## 2018-10-05 DIAGNOSIS — Z8249 Family history of ischemic heart disease and other diseases of the circulatory system: Secondary | ICD-10-CM | POA: Insufficient documentation

## 2018-10-05 DIAGNOSIS — F314 Bipolar disorder, current episode depressed, severe, without psychotic features: Secondary | ICD-10-CM

## 2018-10-05 DIAGNOSIS — R454 Irritability and anger: Secondary | ICD-10-CM | POA: Diagnosis not present

## 2018-10-05 NOTE — Therapy (Signed)
[facility] [place], [place], [address] Phone: [phone]   Fax:  [phone]  Occupational Therapy Treatment  Patient Details  Name: Christine [name] MRN: [id number] Date of Birth: 07-18-1976 Referring Provider (OT): Maudry Mayhew   Encounter Date: 10/05/2018  OT End of Session - 10/05/18 1345    Visit Number  11    Number of Visits  16    Date for OT Re-Evaluation  10/15/18    Authorization Type  [employer]    OT Start Time  1100    OT Stop Time  1200    OT Time Calculation (min)  60 min    Activity Tolerance  Patient tolerated treatment well    Behavior During Therapy  [facility] for tasks assessed/performed       Past Medical History:  Diagnosis Date  . Anemia   . Anxiety   . Arthritis    Hands, ankles  . Bipolar 1 disorder ([place])   . Depression   . Depression   . Fibroids   . Headache   . High blood pressure   . History of blood transfusion 08/2017   [facility]  . History of hiatal hernia   . Mental disorder   . Migraines   . PCOS (polycystic ovarian syndrome)   . Seasonal allergies     Past Surgical History:  Procedure Laterality Date  . CERVICAL POLYPECTOMY  01/15/2018   Procedure: CERVICAL POLYPECTOMY;  Surgeon: Arvella Nigh, MD;  Location: [facility];  Service: Gynecology;;  . COLONOSCOPY  2008   Normal  . DIAGNOSTIC LAPAROSCOPY  08/1999   dermoid cyst, [facility]  . DILATION AND CURETTAGE OF UTERUS  05/2004   [facility]  . DILATION AND CURETTAGE OF UTERUS  04/2015  . HYSTEROSCOPY W/D&C  07/22/2011   Procedure: DILATATION AND CURETTAGE /HYSTEROSCOPY;  Surgeon: Cyril Mourning, MD;  Location: [facility];  Service: Gynecology;;  . HYSTEROSCOPY W/D&C N/A 01/15/2018   Procedure: DILATATION AND CURETTAGE [place] WITH MYOSURE;  Surgeon: Arvella Nigh, MD;  Location: [facility];  Service: Gynecology;  Laterality: N/A;  . 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  Subjective Assessment - 10/05/18 1344    Currently in Pain?  Other (Comment)   chronic pain     S: "This made me feel more motivated"   O: Education given on goal setting to help improve routine management and increase motivation while reintegrating into daily routines. Pt to choose from the following areas: work/school, social participation, physical health, family, leisure, and personality. Pt to create immediate, short, and medium term goals based off the SMART framework. Pt asked to share and discuss at end of session. Blank planner handouts given for pt to continue goal setting in daily routine.   A: Pt presents to group with blunted affect, engaged and participatory throughout session. Pt set goals around sleep hygiene, using her new pill organizer, joining a leisure group, and using music as a Technical sales engineer. Pt in interested in continuously using a paper planner to continue to manage goals and routine.   P: OT group will be 4x per week while pt in PHP.                   OT Education - 10/05/18 1344    Education Details  education given on goal setting    Person(s) Educated  Patient    Methods  Explanation;Handout  Comprehension  Verbalized understanding       OT Short Term Goals - 09/21/18 1359      OT SHORT TERM GOAL #1   Title  Patient will be educated on strategies to improve psychosocial skills needed to participate fully in all daily, work, and leisure activities.    Time  4    Period  Weeks    Status  On-going    Target Date  10/15/18      OT SHORT TERM GOAL #2   Title  Pt will apply psychosocial skills and coping mechanisms to daily activities in order to function independently and reintegrate into community dwelling    Time  4    Period  Weeks    Status  On-going    Target Date  10/15/18      OT SHORT TERM GOAL #3   Title  Pt will recall and/or apply 1-3 sleep hygiene strategies to improve BADL routine upon reintegrating into community     Time  4    Period  Weeks    Status  On-going    Target Date  10/15/18      OT SHORT TERM GOAL #4   Title  Pt will choose and/or engage in 1-3 socially engaging leisure activities to improve social participation skills upon reintegrating into community    Time  4    Period  Weeks    Status  On-going      OT SHORT TERM GOAL #5   Title  Pt will engage in goal setting to improve functional BADL/IADL routine upon reintegrating into community    Time  4    Period  Weeks    Status  On-going    Target Date  10/15/18               Plan - 10/05/18 1346    Occupational performance deficits (Please refer to evaluation for details):  ADL's;IADL's;Rest and Sleep;Leisure;Social Participation    Pt will benefit from skilled therapeutic intervention in order to improve on the following performance deficits  Body Structure / Function / Physical Skills;Cognitive Skills;Psychosocial Skills    Body Structure / Function / Physical Skills  ADL    Cognitive Skills  Energy/Drive;Temperament/Personality;Emotional    Psychosocial Skills  Coping Strategies;Environmental  Adaptations;Interpersonal Interaction;Habits;Routines and Behaviors       Patient will benefit from skilled therapeutic intervention in order to improve the following deficits and impairments:     Visit Diagnosis: Organic personality disorder  Difficulty coping    Problem List Patient Active Problem List   Diagnosis Date Noted  . Chronic migraine without aura without status migrainosus, not intractable 09/21/2018  . Bipolar 1 disorder, depressed, severe ([place]) 09/08/2018  . Endometrium, polyp 01/15/2018    Class: Present on Admission  . Fibroids, submucosal 01/15/2018    Class: Present on Admission  . Iron deficiency anemia due to chronic blood loss 09/16/2017  . Menorrhagia with regular cycle   . Symptomatic anemia 08/28/2017  . Acute blood loss anemia 08/28/2017  . Bipolar disorder ([place]) 08/28/2017  . Anxiety  08/28/2017  . Depression 08/28/2017  . Fibroids 08/28/2017  . PCOS (polycystic ovarian syndrome) 08/28/2017  . Fever 08/28/2017  . HTN (hypertension) 08/28/2017  . Morbid obesity ([place]) 12/04/2015  . Pregnancy with history of uterine myomectomy 04/07/2015  . History of right oophorectomy 02/21/2015  . History of recurrent miscarriages 02/01/2015  . Vitamin D deficiency 02/01/2015   Zenovia Jarred, MSOT, OTR/L Behavioral Health OT/ Acute Relief  OT PHP Office: [phone]  Zenovia Jarred 10/05/2018, 1:50 PM  [facility] HOSPITALIZATION PROGRAM [place], [place], [address] Phone: [phone]   Fax:  [phone]  Name: Christine [name] MRN: [id number] Date of Birth: Aug 21, 1975

## 2018-10-06 ENCOUNTER — Other Ambulatory Visit (HOSPITAL_COMMUNITY): Payer: Medicare Other | Admitting: Licensed Clinical Social Worker

## 2018-10-06 ENCOUNTER — Encounter (HOSPITAL_COMMUNITY): Payer: Self-pay | Admitting: Family

## 2018-10-06 ENCOUNTER — Other Ambulatory Visit (HOSPITAL_COMMUNITY): Payer: Medicare Other | Admitting: Occupational Therapy

## 2018-10-06 ENCOUNTER — Encounter (HOSPITAL_COMMUNITY): Payer: Self-pay | Admitting: Occupational Therapy

## 2018-10-06 DIAGNOSIS — F314 Bipolar disorder, current episode depressed, severe, without psychotic features: Secondary | ICD-10-CM

## 2018-10-06 DIAGNOSIS — F411 Generalized anxiety disorder: Secondary | ICD-10-CM

## 2018-10-06 DIAGNOSIS — F070 Personality change due to known physiological condition: Secondary | ICD-10-CM

## 2018-10-06 DIAGNOSIS — R4589 Other symptoms and signs involving emotional state: Secondary | ICD-10-CM

## 2018-10-06 NOTE — Psych (Signed)
   [facility] BH PHP THERAPIST PROGRESS NOTE  Christine [name] [id number]  Session Time: 9:00 - 11:00  Participation Level: Active  Behavioral Response: CasualAlertAnxiousDepressed  Type of Therapy: Group Therapy; Psychotherapy  Treatment Goals addressed: Coping  Interventions: CBT, DBT, Solution Focused, Supportive and Reframing  Summary: Clinician led check-in regarding current stressors and situation, and review of patient completed daily inventory. Clinician utilized active listening and empathetic response and validated patient emotions. Clinician facilitated processing group on pertinent issues.  Therapist Response: Patient arrived within time allowed and reports that she is feeling "not great". Patient rates her mood at a 3 on a scale of 1-10 with 10 being great. Pt states that she developed a migraine yesterday, took newly prescribed migraine medication and "hated it" because "it dragged me down". Pt. states she would fall asleep, wake up and then fall asleep again, and that was very frustrating. Pt states her daughters kept on interrupting her sleep asking her questions and she told them to ask their dad instead, but their interruptions added to her frustration. Pt. states she woke up with a headache this morning and is going to reach out to the doctor about her reaction to the medication. Patient continues to struggle with communicating needs to her children and husband. Patient engaged in discussion.       Session Time: 11:00 -12:15  Participation Level: Active  Behavioral Response: CasualAlertDepressed  Type of Therapy: Group Therapy, psychotherapy  Treatment Goals addressed: Coping  Interventions: Strengths based, reframing, Supportive,   Summary:  Spiritual Care group  Therapist Response: Patient engaged in group. See chaplain note.         Session Time: 12:15 - 1:00  Participation Level: Active  Behavioral Response: CasualAlertDepressed  Type of Therapy:  Group Therapy  Treatment Goals addressed: Coping  Interventions: Systems analyst, Supportive  Summary:  Reflection Group: Patients encouraged to practice skills and interpersonal techniques or work on mindfulness and relaxation techniques. The importance of self-care and making skills part of a routine to increase usage were stressed   Therapist Response: Patient engaged and participated appropriately.        Session Time: 1:00- 2:00  Participation Level: Active  Behavioral Response: CasualAlertDepressed  Type of Therapy: Group Therapy, Psychoeducation  Treatment Goals addressed: Coping  Interventions: relaxation training; Supportive; Reframing  Summary: 12:45 - 1:50: Relaxation group: Cln led group focused on retraining the body's response to stress.   1:50 -2:00 Clinician led check-out. Clinician assessed for immediate needs, medication compliance and efficacy, and safety concerns   Therapist Response: Patient engaged in activity and discussion. At [place], patient rates her mood at a 4 on a scale of 1-10 with 10 being great. Patient reports afternoon plans to pick daughter up from tutoring and going home. Patient demonstrates some progress as evidenced by increased effort to build support system outside of family. Patient denies SI/HI/self-harm thoughts at the end of group.      Suicidal/Homicidal: Nowithout intent/plan  Plan: Patient will continue in PHP while working on decreasing depression and anxiety symptoms and SI, and increasing ability to manage symptoms in a healthy manner.   Diagnosis: Bipolar 1 disorder, depressed, severe ([place]) [F31.4]    1. Bipolar 1 disorder, depressed, severe ([place])   2. Generalized anxiety disorder     [name], LCSW 10/06/2018

## 2018-10-06 NOTE — Psych (Signed)
   [facility] BH PHP THERAPIST PROGRESS NOTE  Reyes Fifield [id number]  Session Time: 9:00 - 10:15  Participation Level: Active  Behavioral Response: CasualAlertAnxiousDepressed  Type of Therapy: Group Therapy; Psychotherapy  Treatment Goals addressed: Coping  Interventions: CBT, DBT, Solution Focused, Supportive and Reframing  Summary: Clinician led check-in regarding current stressors and situation, and review of patient completed daily inventory. Clinician utilized active listening and empathetic response and validated patient emotions. Clinician facilitated processing group on pertinent issues.  Therapist Response: Patient arrived within time allowed and reports that she is "not doing terrible" but feeling tired. Patient rates her mood at a 5 on a scale of 1-10 with 10 being great. Pt. reports she did not sleep well last night because of ruminating thoughts. Pt. states she "had to let go of some things that had to be done" yesterday because her daughter had a friend over to play and "I spent most of my time trying to keep the girls quiet". Pt. states she almost didn't come to group today but decided delegate childcare to husband and left the house. Patient continues to struggle with self-worth. Patient engaged in discussion.        Session Time: 10:15 -11:00   Participation Level: Active   Behavioral Response: CasualAlertDepressed   Type of Therapy: Group Therapy, psychoeducation, psychotherapy   Treatment Goals addressed: Coping   Interventions: CBT, DBT, Solution Focused, Supportive and Reframing   Summary: Clinician led discussion on how to use reframing skills when negatively comparing yourself to other people or having negative self-talk. Patients discussed areas where they feel they could incorporate reframing skills.   Therapist Response: Patient engaged and participated in discussion. Pt. states she will use reframing skills when she begins to question her self-worth  and abilities.        Session Time: 11:00 -12:00  Participation Level:Active  Behavioral Response:CasualAlertDepressed  Type of Therapy: Group Therapy, OT  Treatment Goals addressed: Coping  Interventions:Psychosocial skills training, Supportive,   Summary:Occupational Therapy group  Therapist Response: Patient engaged in group. See OT note.        Session Time: 12:00 - 1:00  Participation Level: Active  Behavioral Response: CasualAlertDepressed  Type of Therapy: Group Therapy, Psychoeducation, psychotherapy  Treatment Goals addressed: Coping  Interventions: CBT, DBT, solution focused, Supportive; Reframing  Summary: 12:00 - 12:50: Clinician introduced topic of Positive Psychology. Group viewed TED talk and members discussed the five Positive Psychology skills: 3 gratitudes, positive journaling, exercise, meditation, and random acts of kindness.  12:50 -1:00 Clinician led check-out. Clinician assessed for immediate needs, medication compliance and efficacy, and safety concerns    Therapist Response: Patient engaged in discussion and states wanting to try 3 gratitudes for the next 21 days.  At [place], patient rates her mood at a 6 on a scale of 1-10 with 10 being great. Patient reports weekend plans to begin knitting project. Patient demonstrates some progress as evidenced by setting boundaries, such as delegating childcare responsibilities to husband. Patient denies SI/HI/self-harm at the end of group.      Suicidal/Homicidal: Nowithout intent/plan  Plan: Patient will continue in PHP while working on decreasing depression and anxiety symptoms and SI, and increasing ability to manage symptoms in a healthy manner.   Diagnosis: Bipolar 1 disorder, depressed, severe ([place]) [F31.4]    1. Bipolar 1 disorder, depressed, severe ([place])   2. Generalized anxiety disorder     [name], LCSW 10/06/2018

## 2018-10-06 NOTE — Psych (Signed)
   [facility] BH PHP THERAPIST PROGRESS NOTE  Jerrilyn Crothers [id number]  Session Time: 9:00 - 11:00  Participation Level: Active  Behavioral Response: CasualAlertAnxiousDepressed  Type of Therapy: Group Therapy; Psychotherapy  Treatment Goals addressed: Coping  Interventions: CBT, DBT, Solution Focused, Supportive and Reframing  Summary: Clinician led check-in regarding current stressors and situation, and review of patient completed daily inventory. Clinician utilized active listening and empathetic response and validated patient emotions. Clinician facilitated processing group on pertinent issues.  Therapist Response: Patient arrived within time allowed and reports feeling "neutral". Pt rates her mood at a 5 on a scale of 1-10 with 10 being great. Pt reports yesterday afternoon was "uneventful" and she did the normal school day routine. Pt reports that evening she and her husband discussed how "crappy" things have been recently and wanting to move abroad in the next year. Pt reports desire to move to [place] for a year so that she and her family can all have the experience of living in another culture and learning from it. Pt seems animated when discussing this dream. Patient is struggling with increasing routine for her children.  Patient engaged in discussion.        Session Time: 11:00 -12:00   Participation Level: Active   Behavioral Response: CasualAlertDepressed   Type of Therapy: Group Therapy, OT   Treatment Goals addressed: Coping   Interventions: Psychosocial skills training, Supportive,    Summary:  Occupational Therapy group   Therapist Response: Patient engaged in group. See OT note.            Session Time: 12:00 - 12:45  Participation Level: Active  Behavioral Response: CasualAlertDepressed  Type of Therapy: Group Therapy, Activity Therapy  Treatment Goals addressed: Coping  Interventions: Systems analyst, Supportive  Summary:  Reflection  Group: Patients encouraged to practice skills and interpersonal techniques or work on mindfulness and relaxation techniques. The importance of self-care and making skills part of a routine to increase usage were stressed   Therapist Response: Patient engaged and participated appropriately.          Session Time: 12:45 - 2:00  Participation Level: Active  Behavioral Response: CasualAlertAnxiousDepressed  Type of Therapy: Group Therapy; Psychotherapy; Psychoeducation  Treatment Goals addressed: Coping  Interventions: CBT, DBT, Solution Focused, Supportive and Reframing  Summary: 12:45 - 1:50: Clinician continued topic of distress tolerance skills and ACCEPTS. Group learned Different Emotion, Pushing away, Thoughts, and Sensation.  Pt's discussed how they can utilize these skills. 1:50 -2:00 Clinician led check-out. Clinician assessed for immediate needs, medication compliance and efficacy, and safety concerns   Therapist Response: Patient engaged activity and discussion. Pt reports "5-4-3-2-1" as a way she can practice the Thoughts skill.  At [place], patient rates her mood at a 5 on a scale of 1-10 with 10 being great. Patient reports afternoon plans of watching for snow and knitting. Patient demonstrates some progress as evidenced by planning for a positive future. Patient denies SI/HI/self-harm at the end of group.      Suicidal/Homicidal: Nowithout intent/plan  Plan: Patient will continue in PHP while working on decreasing depression and anxiety symptoms and SI, and increasing ability to manage symptoms in a healthy manner.   Diagnosis: Bipolar 1 disorder, depressed, severe ([place]) [F31.4]    1. Bipolar 1 disorder, depressed, severe ([place])   2. Generalized anxiety disorder     [name], LCSW 10/06/2018

## 2018-10-06 NOTE — Psych (Signed)
   [facility] BH PHP THERAPIST PROGRESS NOTE  Christine [name] [id number]  Session Time: 9:00 - 11:00  Participation Level: Active  Behavioral Response: CasualAlertAnxiousDepressed  Type of Therapy: Group Therapy; Psychotherapy  Treatment Goals addressed: Coping  Interventions: CBT, DBT, Solution Focused, Supportive and Reframing  Summary: Clinician led check-in regarding current stressors and situation, and review of patient completed daily inventory. Clinician utilized active listening and empathetic response and validated patient emotions. Clinician facilitated processing group on pertinent issues.  Therapist Response: Patient arrived within time allowed and reports feeling "blah." Pt rates her mood at a 4 on a scale of 1-10 with 10 being great. Pt reports she "didn't do much" over the weekend. Pt states she had a Facebook memory that she had adopted her daughter 9 years ago and that sent her into a grief spiral about her mom. Pt states she became angry that her mom has passed and that her daughters won't have her throughout their lives. Pt states fear that they will not remember her mom. Pt shares she spoke with her daughters and they had good memories of their grandmother. Pt continues to struggle with judging her grief. Patient engaged in discussion.         Session Time: 11:00 -12:00   Participation Level: Active   Behavioral Response: CasualAlertDepressed   Type of Therapy: Group Therapy, OT   Treatment Goals addressed: Coping   Interventions: Psychosocial skills training, Supportive,    Summary:  Occupational Therapy group   Therapist Response: Patient engaged in group. See OT note.            Session Time: 12:00 - 12:45  Participation Level: Active  Behavioral Response: CasualAlertDepressed  Type of Therapy: Group Therapy, Activity Therapy  Treatment Goals addressed: Coping  Interventions: Systems analyst, Supportive  Summary:  Reflection Group:  Patients encouraged to practice skills and interpersonal techniques or work on mindfulness and relaxation techniques. The importance of self-care and making skills part of a routine to increase usage were stressed   Therapist Response: Patient engaged and participated appropriately.          Session Time: 12:45 - 2:00  Participation Level: Active  Behavioral Response: CasualAlertAnxiousDepressed  Type of Therapy: Group Therapy; Psychotherapy; Psychoeducation  Treatment Goals addressed: Coping  Interventions: CBT, DBT, Solution Focused, Supportive and Reframing  Summary: 12:45 - 1:50: Clinician continued topic of distress tolerance skills and introduced STOP, TIPP, and self soothe skills. Group discussed how to incorporate these skills into their every day lives. 1:50 -2:00 Clinician led check-out. Clinician assessed for immediate needs, medication compliance and efficacy, and safety concerns   Therapist Response: Patient engaged activity and discussion. Pt reports deep breathing as a way to practice TIPP skill.  [name], patient rates her mood at a 5 on a scale of 1-10 with 10 being great. Patient reports afternoon plans of picking up groceries, helping her daughter with a project, and knitting. Patient demonstrates some progress as evidenced by using knitting as a coping skill throughout the weekend. Patient denies SI/HI/self-harm at the end of group.      Suicidal/Homicidal: Nowithout intent/plan  Plan: Patient will continue in PHP while working on decreasing depression and anxiety symptoms and SI, and increasing ability to manage symptoms in a healthy manner.   Diagnosis: Bipolar 1 disorder, depressed, severe ([place]) [F31.4]    1. Bipolar 1 disorder, depressed, severe ([place])   2. Generalized anxiety disorder     Christine Glass, LCSW 10/06/2018

## 2018-10-06 NOTE — Therapy (Signed)
[place], [place], [address] Phone: [phone]   Fax:  [phone]  Occupational Therapy Treatment  Patient Details  Name: Christine [name] MRN: [id number] Date of Birth: Sep 09, 1975 Referring Provider (OT): Maudry Mayhew   Encounter Date: 10/06/2018  OT End of Session - 10/06/18 1345    Visit Number  12    Number of Visits  16    Date for OT Re-Evaluation  10/15/18    Authorization Type  [employer]    OT Start Time  1100    OT Stop Time  1200    OT Time Calculation (min)  60 min    Activity Tolerance  Patient tolerated treatment well    Behavior During Therapy  [facility] for tasks assessed/performed       Past Medical History:  Diagnosis Date  . Anemia   . Anxiety   . Arthritis    Hands, ankles  . Bipolar 1 disorder ([place])   . Depression   . Depression   . Fibroids   . Headache   . High blood pressure   . History of blood transfusion 08/2017   [facility]  . History of hiatal hernia   . Mental disorder   . Migraines   . PCOS (polycystic ovarian syndrome)   . Seasonal allergies     Past Surgical History:  Procedure Laterality Date  . CERVICAL POLYPECTOMY  01/15/2018   Procedure: CERVICAL POLYPECTOMY;  Surgeon: Arvella Nigh, MD;  Location: [facility];  Service: Gynecology;;  . COLONOSCOPY  2008   Normal  . DIAGNOSTIC LAPAROSCOPY  08/1999   dermoid cyst, [facility]  . DILATION AND CURETTAGE OF UTERUS  05/2004   [facility]  . DILATION AND CURETTAGE OF UTERUS  04/2015  . HYSTEROSCOPY W/D&C  07/22/2011   Procedure: DILATATION AND CURETTAGE /HYSTEROSCOPY;  Surgeon: Cyril Mourning, MD;  Location: [facility];  Service: Gynecology;;  . HYSTEROSCOPY W/D&C N/A 01/15/2018   Procedure: DILATATION AND CURETTAGE [place] WITH MYOSURE;  Surgeon: Arvella Nigh, MD;  Location: [facility];  Service: Gynecology;  Laterality: N/A;  . 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  Subjective Assessment - 10/06/18 1344    Currently in Pain?  Other (Comment)   chronic pain       S: "I could really improve socially and by having some fun"   O: Education given on self care and its importance in regular BADL/IADL routine. Pt completed self care assessment to identify areas of strength and weakness. Self care assessments covered areas of physical health, psychological health, spiritual health, and professional health. Pt asked to identifies area of weakness within each area and develop plans for improvement this date. Pt encouraged to brainstorm with other peers to begin goal setting in areas of desired change.  ?  A: Pt presents to group with blunted affect, engaged and participatory throughout session. Pt completed self care checklist stating that she needs to improve her healthy eating, sleep, social participation and leisure. She shares that she did find information on a local knitting group she is considering joining. Pt has been known to not implement strategies consistently and needs continued support for this. Pt did share that she began using her pill organizer (a task from a previous date) and how convenient and easy it was to do so.   P: Pt educated on importance of self care in BADL/IADL routine. OT will  continue to follow up with pt each treatment session to ensure carryover into daily routine to facilitate successful community integration. OT treatment will be 4 times a week                     OT Education - 10/06/18 1345    Education Details  education given on self care     Person(s) Educated  Patient    Methods  Explanation;Handout    Comprehension  Verbalized understanding       OT Short Term Goals - 10/06/18 1346      OT SHORT TERM GOAL #1   Title  Patient will be educated on strategies to improve psychosocial skills needed to participate fully in all daily, work, and leisure activities.    Time  4    Period  Weeks     Status  Achieved    Target Date  10/15/18      OT SHORT TERM GOAL #2   Title  Pt will apply psychosocial skills and coping mechanisms to daily activities in order to function independently and reintegrate into community dwelling    Time  4    Period  Weeks    Status  Achieved    Target Date  10/15/18      OT SHORT TERM GOAL #3   Title  Pt will recall and/or apply 1-3 sleep hygiene strategies to improve BADL routine upon reintegrating into community    Time  4    Period  Weeks    Status  Achieved    Target Date  10/15/18      OT SHORT TERM GOAL #4   Title  Pt will choose and/or engage in 1-3 socially engaging leisure activities to improve social participation skills upon reintegrating into community    Time  4    Period  Weeks    Status  Achieved    Target Date  10/15/18      OT SHORT TERM GOAL #5   Title  Pt will engage in goal setting to improve functional BADL/IADL routine upon reintegrating into community    Time  4    Period  Weeks    Status  Achieved    Target Date  10/15/18               Plan - 10/06/18 1346    Occupational performance deficits (Please refer to evaluation for details):  ADL's;IADL's;Rest and Sleep;Leisure;Social Participation    Pt will benefit from skilled therapeutic intervention in order to improve on the following performance deficits  Body Structure / Function / Physical Skills;Cognitive Skills;Psychosocial Skills    Body Structure / Function / Physical Skills  ADL    Cognitive Skills  Energy/Drive;Temperament/Personality;Emotional    Psychosocial Skills  Coping Strategies;Environmental  Adaptations;Interpersonal Interaction;Habits;Routines and Behaviors       Patient will benefit from skilled therapeutic intervention in order to improve the following deficits and impairments:     Visit Diagnosis: Organic personality disorder  Difficulty coping    Problem List Patient Active Problem List   Diagnosis Date Noted  . Chronic  migraine without aura without status migrainosus, not intractable 09/21/2018  . Bipolar 1 disorder, depressed, severe ([place]) 09/08/2018  . Endometrium, polyp 01/15/2018    Class: Present on Admission  . Fibroids, submucosal 01/15/2018    Class: Present on Admission  . Iron deficiency anemia due to chronic blood loss 09/16/2017  . Menorrhagia with regular cycle   . Symptomatic anemia  08/28/2017  . Acute blood loss anemia 08/28/2017  . Bipolar disorder ([place]) 08/28/2017  . Anxiety 08/28/2017  . Depression 08/28/2017  . Fibroids 08/28/2017  . PCOS (polycystic ovarian syndrome) 08/28/2017  . Fever 08/28/2017  . HTN (hypertension) 08/28/2017  . Morbid obesity ([place]) 12/04/2015  . Pregnancy with history of uterine myomectomy 04/07/2015  . History of right oophorectomy 02/21/2015  . History of recurrent miscarriages 02/01/2015  . Vitamin D deficiency 02/01/2015    OCCUPATIONAL THERAPY DISCHARGE SUMMARY  Visits from Start of Care: 12  Current functional level related to goals / functional outcomes: Pt is stepping down to IOP level of care for continued support and maintenance of MH.   Remaining deficits: Continuing to implement skills learned   Education / Equipment: Education given on psychosocial and coping skills as it applies to BADL/IADL routine and increasing functional engagement in community. D/c plan handout given and pt in understanding. Plan: Patient agrees to discharge.  Patient goals were met. Patient is being discharged due to meeting the stated rehab goals.  ?????        Zenovia Jarred, MSOT, OTR/L Behavioral Health OT/ Acute Relief OT PHP Office: [place] 10/06/2018, 1:48 PM  [facility] HOSPITALIZATION PROGRAM [place], [place], [address] Phone: [phone]   Fax:  [phone]  Name: Christine [name] MRN: [id number] Date of Birth: [birth year]

## 2018-10-06 NOTE — Progress Notes (Signed)
  [facility] Behavioral Health Partial Hospitalization  Outpatient Program Discharge Summary  Christine [name] [id number]  Admission date: 09/16/2018 Discharge date: 10/06/2018  Reason for admission: Recent discharge from inpatient admission for  suicidal ideation.  Per admission assessment note:Patient is a42 y.o.African Americanfemalewith bipolar disorder, most recent episode depressed, severe who presents withdepression and anxiety. Patientrecently discharged on 09/12/2018 from inpatient behavioral health hospital. Patient was enrolled in partial psychiatric program on 2/12/2020Patient reports that she continues to struggle with her mood, knows that she needs to work on her coping skills, gets overwhelmed easily, feels isolated. Patient has that she does not have much of a social life, knows that she needs to join a church but has not done so. Patient has that her husband is supportive and states that she loves her 2 kids. Patient has that she is no longer having suicidal thoughts since her discharge from the hospital, feels she needs to work in a program so she is not suicidal again and has improvement in her depression and anxiety. On a scale of 0-10, with 0 being no symptoms and 10being the worst, patient reports that her depression is an 8 out of 10 and her anxiety is a 7 out of 10 as she worries about her not getting better. Patient denies any symptoms of mania, any substance use  Chemical Use History: was denied   Progress in Program Toward Treatment Goals: Ongoing   Progress (rationale): Stepping down to Intensive Outpatient programing  (IOP) on 10/09/2018. - Li level 0.4. on 10/03/2018 with titrate lithium 600 mg BID to Lithium 900 mg BID  and increased Abilify 10 mg to 15 mg- as case was staffed with MD [name] patient primary psychiatrist. As patient reports she is unable to afford the [place], due to her insurance.   Take all medications as prescribed. Keep all follow-up  appointments as scheduled.  Do not consume alcohol or use illegal drugs while on prescription medications. Report any adverse effects from your medications to your primary care provider promptly.  In the event of recurrent symptoms or worsening symptoms, call 911, a crisis hotline, or go to the nearest emergency department for evaluation.     Derrill Center, NP 10/06/2018

## 2018-10-07 ENCOUNTER — Encounter (INDEPENDENT_AMBULATORY_CARE_PROVIDER_SITE_OTHER): Payer: Self-pay | Admitting: Family Medicine

## 2018-10-07 ENCOUNTER — Other Ambulatory Visit (INDEPENDENT_AMBULATORY_CARE_PROVIDER_SITE_OTHER): Payer: Self-pay | Admitting: Family Medicine

## 2018-10-07 ENCOUNTER — Ambulatory Visit (INDEPENDENT_AMBULATORY_CARE_PROVIDER_SITE_OTHER): Payer: Medicare Other | Admitting: Family Medicine

## 2018-10-07 VITALS — BP 126/81 | HR 95 | Ht 64.0 in | Wt 337.0 lb

## 2018-10-07 DIAGNOSIS — Z1331 Encounter for screening for depression: Secondary | ICD-10-CM | POA: Diagnosis not present

## 2018-10-07 DIAGNOSIS — F319 Bipolar disorder, unspecified: Secondary | ICD-10-CM

## 2018-10-07 DIAGNOSIS — Z9189 Other specified personal risk factors, not elsewhere classified: Secondary | ICD-10-CM

## 2018-10-07 DIAGNOSIS — I10 Essential (primary) hypertension: Secondary | ICD-10-CM | POA: Diagnosis not present

## 2018-10-07 DIAGNOSIS — R5383 Other fatigue: Secondary | ICD-10-CM | POA: Diagnosis not present

## 2018-10-07 DIAGNOSIS — Z0289 Encounter for other administrative examinations: Secondary | ICD-10-CM

## 2018-10-07 DIAGNOSIS — E559 Vitamin D deficiency, unspecified: Secondary | ICD-10-CM | POA: Diagnosis not present

## 2018-10-07 DIAGNOSIS — R739 Hyperglycemia, unspecified: Secondary | ICD-10-CM | POA: Diagnosis not present

## 2018-10-07 DIAGNOSIS — Z6841 Body Mass Index (BMI) 40.0 and over, adult: Secondary | ICD-10-CM | POA: Diagnosis not present

## 2018-10-07 DIAGNOSIS — R0602 Shortness of breath: Secondary | ICD-10-CM | POA: Diagnosis not present

## 2018-10-07 DIAGNOSIS — E282 Polycystic ovarian syndrome: Secondary | ICD-10-CM | POA: Diagnosis not present

## 2018-10-08 ENCOUNTER — Other Ambulatory Visit (HOSPITAL_COMMUNITY): Payer: Self-pay

## 2018-10-08 ENCOUNTER — Ambulatory Visit (HOSPITAL_COMMUNITY): Payer: Self-pay

## 2018-10-08 LAB — COMPREHENSIVE METABOLIC PANEL
ALT: 13 IU/L (ref 0–32)
AST: 11 IU/L (ref 0–40)
Albumin/Globulin Ratio: 1.5 (ref 1.2–2.2)
Albumin: 4.2 g/dL (ref 3.8–4.8)
Alkaline Phosphatase: 65 IU/L (ref 39–117)
BUN/Creatinine Ratio: 13 (ref 9–23)
BUN: 10 mg/dL (ref 6–24)
Bilirubin Total: 0.4 mg/dL (ref 0.0–1.2)
CO2: 21 mmol/L (ref 20–29)
Calcium: 9.5 mg/dL (ref 8.7–10.2)
Chloride: 104 mmol/L (ref 96–106)
Creatinine, Ser: 0.77 mg/dL (ref 0.57–1.00)
GFR calc Af Amer: 111 mL/min/{1.73_m2} (ref >59)
GFR calc non Af Amer: 96 mL/min/{1.73_m2} (ref >59)
Globulin, Total: 2.8 g/dL (ref 1.5–4.5)
Glucose: 78 mg/dL (ref 65–99)
Potassium: 4.4 mmol/L (ref 3.5–5.2)
Sodium: 138 mmol/L (ref 134–144)
Total Protein: 7 g/dL (ref 6.0–8.5)

## 2018-10-08 LAB — VITAMIN D 25 HYDROXY (VIT D DEFICIENCY, FRACTURES): Vit D, 25-Hydroxy: 7 ng/mL — ABNORMAL LOW (ref 30.0–100.0)

## 2018-10-08 LAB — T3: T3, Total: 115 ng/dL (ref 71–180)

## 2018-10-08 LAB — TSH: TSH: 1.26 u[IU]/mL (ref 0.450–4.500)

## 2018-10-08 LAB — INSULIN, RANDOM: INSULIN: 19.5 u[IU]/mL (ref 2.6–24.9)

## 2018-10-08 LAB — HEMOGLOBIN A1C
Est. average glucose Bld gHb Est-mCnc: 117 mg/dL
Hgb A1c MFr Bld: 5.7 % — ABNORMAL HIGH (ref 4.8–5.6)

## 2018-10-08 LAB — LIPID PANEL WITH LDL/HDL RATIO
Cholesterol, Total: 183 mg/dL (ref 100–199)
HDL: 38 mg/dL — ABNORMAL LOW (ref >39)
LDL Calculated: 131 mg/dL — ABNORMAL HIGH (ref 0–99)
LDl/HDL Ratio: 3.4 ratio — ABNORMAL HIGH (ref 0.0–3.2)
Triglycerides: 70 mg/dL (ref 0–149)
VLDL Cholesterol Cal: 14 mg/dL (ref 5–40)

## 2018-10-08 LAB — FOLATE: Folate: 5 ng/mL (ref >3.0)

## 2018-10-08 LAB — T4, FREE: Free T4: 1.12 ng/dL (ref 0.82–1.77)

## 2018-10-08 LAB — VITAMIN B12: Vitamin B-12: 420 pg/mL (ref 232–1245)

## 2018-10-08 NOTE — Progress Notes (Signed)
Office: [phone]  /  Fax: [phone]   Dear Dr. Jaynee Eagles,   Thank you for referring Christine [name] to our clinic. The following note includes my evaluation and treatment recommendations.  HPI:   Chief Complaint: OBESITY    Christine [name] has been referred by Christine Ill, MD for consultation regarding her obesity and obesity related comorbidities.    [name] (MR# [id number]) is a 43 y.o. female who presents on 10/08/2018 for obesity evaluation and treatment. Current BMI is Body mass index is 57.85 kg/m.Christine [name] Christine [name] has been struggling with her weight for many years and has been unsuccessful in either losing weight, maintaining weight loss, or reaching her healthy weight goal.     Christine [name] attended our information session and states she is currently in the action stage of change and ready to dedicate time achieving and maintaining a healthier weight. Christine [name] is interested in becoming our patient and working on intensive lifestyle modifications including (but not limited to) diet, exercise and weight loss.    Christine [name] states her family eats meals together she thinks her family will eat healthier with her her desired weight loss is 148 lbs she has been heavy most of  her life she started gaining weight in college her heaviest weight ever was 350 lbs. she is somewhat of a picky eater and doesn't like to eat healthier foods  she has significant food cravings issues  she snacks frequently in the evenings she wakes up sometimes in the middle of the night to eat she frequently makes poor food choices she has problems with excessive hunger  she sometimes eats larger portions than normal but not always she has binge eating behaviors she struggles with emotional eating    Fatigue Christine [name] feels her energy is lower than it should be. This has worsened with weight gain and has worsened recently. Christine [name] admits to daytime somnolence and  admits to waking up still tired. Patient is at risk for  obstructive sleep apnea. Patent has a history of symptoms of daytime fatigue and morning headache. Patient generally gets 4 or 5 hours of sleep per night, and states they generally do not sleep well most nights. Snoring is present. Apneic episodes are not present. Epworth Sleepiness Score is 13.  Dyspnea on exertion Christine [name] notes increasing shortness of breath with exercising and seems to be worsening over time with weight gain. She notes getting out of breath sooner with activity than she used to. This has gotten worse recently. Christine [name] denies orthopnea.  Vitamin D deficiency Christine [name] has a diagnosis of Vitamin D deficiency. She is not currently taking Vit D and states she has a history of her Vitamin D level being low in the past.  Hypertension Christine [name] is a 43 y.o. female with hypertension which is controlled on amlodipine and metoprolol.  Christine [name] denies chest pain. She is wanting to work on diet and weight loss to help decrease her medications.   Bipolar I Disorder Christine [name] is being followed by Psych and is in therapy. She states she is stable on medications. She denies suicidal or homicidal ideations. She was hospitalized x5 days 1 month ago.  PCOS Christine [name] states she is not on metformin. She does report polyphagia. There is no recent A1c level noted in Epic. She is at a higher risk of developing DM due to her Psych medications.  At risk for diabetes Christine [name] is at higher than averagerisk for developing diabetes due to her obesity. She currently denies polyuria or polydipsia.  Depression Screen  Christine [name]'s Food and Mood (modified PHQ-9) score was 18.  Depression screen PHQ 2/9 10/07/2018  Decreased Interest 2  Down, Depressed, Hopeless 3  PHQ - 2 Score 5  Altered sleeping 2  Tired, decreased energy 3  Change in appetite 1  Feeling bad or failure about yourself  3  Trouble concentrating 2  Moving slowly or fidgety/restless 1  Suicidal thoughts 1  PHQ-9 Score 18  Difficult doing  work/chores Extremely dIfficult  Some encounter information is confidential and restricted. Go to Review Flowsheets activity to see all data.  Some recent data might be hidden   ASSESSMENT AND PLAN:  Other fatigue - Plan: EKG 12-Lead, Vitamin B12, Folate, T3, T4, free, TSH  Shortness of breath on exertion - Plan: Lipid Panel With LDL/HDL Ratio  Vitamin D deficiency - Plan: VITAMIN D 25 Hydroxy (Vit-D Deficiency, Fractures)  Essential hypertension  Bipolar 1 disorder (HCC)  PCOS (polycystic ovarian syndrome) - Plan: Comprehensive metabolic panel, Hemoglobin A1c, Insulin, random  Depression screening  At risk for diabetes mellitus  Class 3 severe obesity with serious comorbidity and body mass index (BMI) of 50.0 to 59.9 in adult, unspecified obesity type (HCC)  PLAN:  Fatigue Christine [name] was informed that her fatigue may be related to obesity, depression or many other causes. Labs will be ordered, and in the meanwhile Christine [name] has agreed to work on diet, exercise and weight loss to help with fatigue. Proper sleep hygiene was discussed including the need for 7-8 hours of quality sleep each night. A sleep study was not ordered based on symptoms and Epworth score.  Dyspnea on exertion Christine [name]'s shortness of breath appears to be obesity related and exercise induced. She has agreed to work on weight loss and gradually increase exercise to treat her exercise induced shortness of breath. If Christine [name] follows our instructions and loses weight without improvement of her shortness of breath, we will plan to refer to pulmonology. We will monitor this condition regularly. Christine [name] agrees to this plan.  Vitamin D Deficiency Christine [name] was informed that low Vitamin D levels contributes to fatigue and are associated with obesity, breast, and colon cancer. She agrees to have routine testing of Vitamin D today. She was informed of the risk of over-replacement of Vitamin D and agrees to not increase her dose unless she  discusses this with [place] first. Christine [name] agrees to follow-up with our clinic in 2 weeks.  Hypertension We discussed sodium restriction, working on healthy weight loss, and a regular exercise program as the means to achieve improved blood pressure control. Christine [name] agreed with this plan and agreed to follow up as directed. We will continue to monitor her blood pressure as well as her progress with the above lifestyle modifications. She will have labs drawn today, continue her medications as prescribed, and start her diet prescription.   Bipolar I Disorder Christine [name] was advised that we will monitor and watch for emotional eating or signs of worsening depression or mania which can flare up if the patient is feeling deprived.  PCOS Christine [name] will have labs drawn today and start her diet prescription. She will follow-up with our clinic in 2 weeks.  Diabetes risk counseling Christine [name] was given extended (15 minutes) diabetes prevention counseling today. She is 43 y.o. female and has risk factors for diabetes including obesity. We discussed intensive lifestyle modifications today with an emphasis on weight loss as well as increasing exercise and decreasing simple carbohydrates in her diet.  Depression Screen Christine [name] had a strongly positive depression screening. Depression  is commonly associated with obesity and often results in emotional eating behaviors. We will monitor this closely and work on CBT to help improve the non-hunger eating patterns. Referral to Psychology may be required if no improvement is seen as she continues in our clinic.  Obesity Christine [name] is currently in the action stage of change and her goal is to continue with weight loss efforts. I recommend Christine [name] begin the structured treatment plan as follows:  She has agreed to follow the category 3 plan.  Christine [name] has been instructed to eventually work up to a goal of 150 minutes of combined cardio and strengthening exercise per week for weight loss and  overall health benefits. We discussed the following Behavioral Modification Strategies today: increasing lean protein intake, decreasing simple carbohydrates, and work on meal planning and easy cooking plans.   She was informed of the importance of frequent follow up visits to maximize her success with intensive lifestyle modifications for her multiple health conditions. She was informed we would discuss her lab results at her next visit unless there is a critical issue that needs to be addressed sooner. Christine [name] agreed to keep her next visit at the agreed upon time to discuss these results.  ALLERGIES: Allergies  Allergen Reactions  . Penicillins Rash and Other (See Comments)    Has patient had a PCN reaction causing immediate rash, facial/tongue/throat swelling, SOB or lightheadedness with hypotension: Yes Has patient had a PCN reaction causing severe rash involving mucus membranes or skin necrosis: Unknown Has patient had a PCN reaction that required hospitalization: No Has patient had a PCN reaction occurring within the last 10 years: No If all of the above answers are "NO", then may proceed with Cephalosporin use.     MEDICATIONS: Current Outpatient Medications on File Prior to Visit  Medication Sig Dispense Refill  . amLODipine (NORVASC) 10 MG tablet Take 1 tablet (10 mg total) by mouth daily. 30 tablet 0  . ARIPiprazole (ABILIFY) 10 MG tablet Take 1 tablet (10 mg total) by mouth daily. For mood 30 tablet 1  . clonazePAM (KLONOPIN) 0.5 MG tablet Take 0.5 mg by mouth 2 (two) times daily as needed for anxiety.    . lamoTRIgine (LAMICTAL) 150 MG tablet Take 1 tablet (150 mg total) by mouth 2 (two) times daily. For mood 60 tablet 1  . lithium 600 MG capsule Take 1 capsule (600 mg total) by mouth 2 (two) times daily with a meal. For mood 60 capsule 1  . metoprolol succinate (TOPROL-XL) 50 MG 24 hr tablet Take 50 mg by mouth daily. Take with or immediately following a meal.    .  Erenumab-aooe (AIMOVIG) 140 MG/ML SOAJ Inject 140 mg into the skin every 30 (thirty) days. (Patient not taking: Reported on 09/24/2018) 1 pen 11   No current facility-administered medications on file prior to visit.     PAST MEDICAL HISTORY: Past Medical History:  Diagnosis Date  . Anemia   . Anxiety   . Arthritis    Hands, ankles  . Back pain   . Bipolar 1 disorder ([place])   . Chest pain   . Depression   . Depression   . Dyspnea   . Fibroids   . Fibromyalgia   . Headache   . High blood pressure   . History of blood transfusion 08/2017   [facility]  . History of hiatal hernia   . Infertility, female   . Joint pain   . Lactose intolerance   . Lower extremity  edema   . Mental disorder   . Migraines   . Osteoarthritis   . PCOS (polycystic ovarian syndrome)   . Seasonal allergies   . Vitamin D deficiency     PAST SURGICAL HISTORY: Past Surgical History:  Procedure Laterality Date  . CERVICAL POLYPECTOMY  01/15/2018   Procedure: CERVICAL POLYPECTOMY;  Surgeon: Arvella Nigh, MD;  Location: [facility];  Service: Gynecology;;  . COLONOSCOPY  2008   Normal  . DIAGNOSTIC LAPAROSCOPY  08/1999   dermoid cyst, [facility]  . DILATION AND CURETTAGE OF UTERUS  05/2004   [facility]  . DILATION AND CURETTAGE OF UTERUS  04/2015  . HYSTEROSCOPY W/D&C  07/22/2011   Procedure: DILATATION AND CURETTAGE /HYSTEROSCOPY;  Surgeon: Cyril Mourning, MD;  Location: [facility];  Service: Gynecology;;  . HYSTEROSCOPY W/D&C N/A 01/15/2018   Procedure: DILATATION AND CURETTAGE [place] WITH MYOSURE;  Surgeon: Arvella Nigh, MD;  Location: [facility];  Service: Gynecology;  Laterality: N/A;  . 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 Smoking status: Never Smoker  . Smokeless tobacco: Never Used  Substance Use Topics  . Alcohol use: Yes    Comment: occasional, maybe once a month or so  . Drug use: No    FAMILY HISTORY: Family History  Problem Relation Age of Onset   . Anxiety disorder Mother   . Depression Mother   . Cancer Mother        pancreatic   . High blood pressure Mother   . Hypertension Mother   . Hyperlipidemia Mother   . Cancer Father        colon  . High blood pressure Father   . Diabetes Father   . Hypertension Father   . Hyperlipidemia Father   . Heart disease Father   . Stroke Father   . Kidney disease Father   . Sleep apnea Father   . Obesity Father   . Cancer Brother 6       leukemia, childhood  . High blood pressure Brother   . Migraines Neg Hx    ROS: Review of Systems  Eyes:       Positive for wearing glasses or contacts.  Respiratory: Positive for shortness of breath (with activity).   Cardiovascular: Positive for chest pain (discomfort). Negative for orthopnea.  Genitourinary:       Negative for polyuria.  Neurological: Positive for headaches.  Endo/Heme/Allergies: Negative for polydipsia.  Psychiatric/Behavioral: Positive for depression. Negative for suicidal ideas. The patient is nervous/anxious and has insomnia.        Negative for homicidal ideas.   PHYSICAL EXAM: Blood pressure 126/81, pulse 95, height 5\' 4"  (1.626 m), weight (!) 337 lb (152.9 kg), last menstrual period 10/05/2018, SpO2 98 %. Body mass index is 57.85 kg/m. Physical Exam Vitals signs reviewed.  Constitutional:      Appearance: Normal appearance. She is well-developed. She is obese.  HENT:     Head: Normocephalic and atraumatic.     Nose: Nose normal.  Eyes:     General: No scleral icterus. Neck:     Musculoskeletal: Normal range of motion.  Cardiovascular:     Rate and Rhythm: Normal rate and regular rhythm.  Pulmonary:     Effort: Pulmonary effort is normal. No respiratory distress.  Abdominal:     Palpations: Abdomen is soft.     Tenderness: There is no abdominal tenderness.  Musculoskeletal: Normal range of motion.  Comments: Range of motion normal in all four extremities.  Skin:    General: Skin is warm and dry.    Neurological:     Mental Status: She is alert and oriented to person, place, and time.     Coordination: Coordination normal.  Psychiatric:        Mood and Affect: Mood and affect normal.        Behavior: Behavior normal.        Thought Content: Thought content does not include homicidal or suicidal ideation.   RECENT LABS AND TESTS: BMET    Component Value Date/Time   NA 138 10/07/2018 1219   K 4.4 10/07/2018 1219   CL 104 10/07/2018 1219   CO2 21 10/07/2018 1219   GLUCOSE 78 10/07/2018 1219   GLUCOSE 94 09/07/2018 1718   BUN 10 10/07/2018 1219   CREATININE 0.77 10/07/2018 1219   CREATININE 0.88 03/19/2018 1051   CALCIUM 9.5 10/07/2018 1219   GFRNONAA 96 10/07/2018 1219   GFRNONAA >60 03/19/2018 1051   GFRAA 111 10/07/2018 1219   GFRAA >60 03/19/2018 1051   Lab Results  Component Value Date   HGBA1C 5.7 (H) 10/07/2018   Lab Results  Component Value Date   INSULIN 19.5 10/07/2018   CBC    Component Value Date/Time   WBC 12.0 (H) 09/07/2018 1718   RBC 4.37 09/07/2018 1718   HGB 10.1 (L) 09/07/2018 1718   HGB 12.6 03/19/2018 1051   HCT 36.5 09/07/2018 1718   PLT 404 (H) 09/07/2018 1718   PLT 266 03/19/2018 1051   MCV 83.5 09/07/2018 1718   MCH 23.1 (L) 09/07/2018 1718   MCHC 27.7 (L) 09/07/2018 1718   RDW 17.4 (H) 09/07/2018 1718   LYMPHSABS 1.9 08/08/2018 1312   MONOABS 1.0 08/08/2018 1312   EOSABS 0.1 08/08/2018 1312   BASOSABS 0.0 08/08/2018 1312   Iron/TIBC/Ferritin/ %Sat    Component Value Date/Time   IRON 52 03/19/2018 1051   TIBC 311 03/19/2018 1051   FERRITIN 30 03/19/2018 1051   IRONPCTSAT 17 (L) 03/19/2018 1051   Lipid Panel     Component Value Date/Time   CHOL 183 10/07/2018 1220   TRIG 70 10/07/2018 1220   HDL 38 (L) 10/07/2018 1220   LDLCALC 131 (H) 10/07/2018 1220   Hepatic Function Panel     Component Value Date/Time   PROT 7.0 10/07/2018 1219   ALBUMIN 4.2 10/07/2018 1219   AST 11 10/07/2018 1219   AST 8 (L) 03/19/2018 1051    ALT 13 10/07/2018 1219   ALT 12 03/19/2018 1051   ALKPHOS 65 10/07/2018 1219   BILITOT 0.4 10/07/2018 1219   BILITOT 0.4 03/19/2018 1051      Component Value Date/Time   TSH 1.260 10/07/2018 1219   TSH 1.460 04/28/2018 1130   No Results Found For: Vitamin D, 25-Hydroxy  ECG shows sinus rhythm with a rate of 86 BPM.  INDIRECT CALORIMETER done today shows a VO2 of 290 and a REE of 2024.  Her calculated basal metabolic rate is 1191 thus her basal metabolic rate is worse than expected.  OBESITY BEHAVIORAL INTERVENTION VISIT  Today's visit was #1   Starting weight: 337 lbs Starting date: 10/07/2018 Today's weight: 337 lbs  Today's date: 10/07/2018 Total lbs lost to date: 0    10/07/2018  Height 5\' 4"  (1.626 m)  Weight 337 lb (152.9 kg) (A)  BMI (Calculated) 57.82  BLOOD PRESSURE - SYSTOLIC 478  BLOOD PRESSURE - DIASTOLIC 81  Body Fat % 58.4 %  RMR 2024   ASK: We discussed the diagnosis of obesity with Louis Meckel today and Christine [name] agreed to give [place] permission to discuss obesity behavioral modification therapy today.  ASSESS: Christine [name] has the diagnosis of obesity and her BMI today is 57.82 Christine [name] is in the action stage of change.   ADVISE: Christine [name] was educated on the multiple health risks of obesity as well as the benefit of weight loss to improve her health. She was advised of the need for long term treatment and the importance of lifestyle modifications to improve her current health and to decrease her risk of future health problems.  AGREE: Multiple dietary modification options and treatment options were discussed and  Christine [name] agreed to follow the recommendations documented in the above note.  ARRANGE: Christine [name] was educated on the importance of frequent visits to treat obesity as outlined per CMS and USPSTF guidelines and agreed to schedule her next follow up appointment today.  I, Michaelene Song, am acting as Location manager for Dennard Nip, MD  I have reviewed the  above documentation for accuracy and completeness, and I agree with the above. -Dennard Nip, MD

## 2018-10-08 NOTE — Psych (Signed)
   [facility] PHP THERAPIST PROGRESS NOTE  Christine [name] [id number]  Session Time: 9:00 - 11:00  Participation Level: Active  Behavioral Response: CasualAlertAnxiousDepressed  Type of Therapy: Group Therapy; Psychotherapy  Treatment Goals addressed: Coping  Interventions: CBT, DBT, Solution Focused, Supportive and Reframing  Summary: Clinician led check-in regarding current stressors and situation, and review of patient completed daily inventory. Clinician utilized active listening and empathetic response and validated patient emotions. Clinician facilitated processing group on pertinent issues.  Therapist Response: Patient arrived within time allowed and reports he had a "hard time" getting up this morning. Pt rates her mood at a 4 on a scale of 1-10 with 10 being great. Pt reports her evening yesterday went well. She knit, helped her daughter with a project, got groceries, and took 2 naps. Pt states she was confused by the naps because she doesn't nap typically, but they were restful. pt shares she watched tv with her husband and "actually laughed" which felt good. Pt continues to struggle with setting routines for the household, stating she again had a hectic morning that left her feeling frazzled. Patient engaged in discussion.         Session Time: 11:00 -12:00   Participation Level: Active   Behavioral Response: CasualAlertDepressed   Type of Therapy: Group Therapy, OT   Treatment Goals addressed: Coping   Interventions: Psychosocial skills training, Supportive,    Summary:  Occupational Therapy group   Therapist Response: Patient engaged in group. See OT note.            Session Time: 12:00 - 12:45  Participation Level: Active  Behavioral Response: CasualAlertDepressed  Type of Therapy: Group Therapy, Activity Therapy  Treatment Goals addressed: Coping  Interventions: Systems analyst, Supportive  Summary:  Reflection Group: Patients encouraged  to practice skills and interpersonal techniques or work on mindfulness and relaxation techniques. The importance of self-care and making skills part of a routine to increase usage were stressed   Therapist Response: Patient engaged and participated appropriately.          Session Time: 12:45 - 2:00  Participation Level: Active  Behavioral Response: CasualAlertAnxiousDepressed  Type of Therapy: Group Therapy; Psychotherapy; Psychoeducation  Treatment Goals addressed: Coping  Interventions: CBT, DBT, Solution Focused, Supportive and Reframing  Summary: 12:45 - 1:50: Clinician introduced topic of cognitive distortions. Cln educated on what cognitive distortions are and how they affect [place]. Cln introduced "Catch, Challenge, Change" and group reviewed cognitive distortion handout and came up with examples to work on "catch." 1:50 -2:00 Clinician led check-out. Clinician assessed for immediate needs, medication compliance and efficacy, and safety concerns   Therapist Response: Patient engaged activity and discussion. Pt was able to identify real life examples of cognitive distortions discussed.  At [place], patient rates her mood at a 6 on a scale of 1-10 with 10 being great. Patient reports afternoon plans of "normal stuff" and plans to knit more. Patient demonstrates some progress as evidenced by doing self care for herself yesterday by allowing herself to rest. Patient denies SI/HI/self-harm at the end of group.      Suicidal/Homicidal: Nowithout intent/plan  Plan: Patient will continue in PHP while working on decreasing depression and anxiety symptoms and SI, and increasing ability to manage symptoms in a healthy manner.   Diagnosis: Bipolar 1 disorder, depressed, severe ([place]) [F31.4]    1. Bipolar 1 disorder, depressed, severe ([place])   2. Generalized anxiety disorder     [name], LCSW 10/08/2018

## 2018-10-08 NOTE — Psych (Signed)
   [facility] PHP THERAPIST PROGRESS NOTE  Christine [name] [id number]  Session Time: 9:00 - 11:00  Participation Level: Active  Behavioral Response: CasualAlertAnxiousDepressed  Type of Therapy: Group Therapy; Psychotherapy  Treatment Goals addressed: Coping  Interventions: CBT, DBT, Solution Focused, Supportive and Reframing  Summary: Clinician led check-in regarding current stressors and situation, and review of patient completed daily inventory. Clinician utilized active listening and empathetic response and validated patient emotions. Clinician facilitated processing group on pertinent issues.  Therapist Response: Patient arrived within time allowed and reports that "I have been feeling down the past few days and I can't explain why". Patient rates her mood at a 4 on a scale of 1-10 with 10 being great. Pt. reports "it's been harder to get up in the mornings lately" and she is having a difficult time identifying the cause of it. Pt. states she had a rough afternoon because her daughters brought home their progress reports and her 43 y/o is not doing well in a class. Pt. states she was upset with her daughter but "I felt like I was failing my kids". Pt. reports that she had negative thoughts about her abilities as a mother and felt that because of her mental health situation that her daughter is not doing well in school. Patient continues to struggles with difficulty expressing feelings. Patient engaged in discussion.         Session Time: 11:00 -12:15  Participation Level: Active  Behavioral Response: CasualAlertDepressed  Type of Therapy: Group Therapy, psychotherapy  Treatment Goals addressed: Coping  Interventions: Strengths based, reframing, Supportive,   Summary:  Spiritual Care group  Therapist Response: Patient engaged in group. See chaplain note.         Session Time: 12:15 - 1:00  Participation Level: Active  Behavioral Response:  CasualAlertDepressed  Type of Therapy: Group Therapy  Treatment Goals addressed: Coping  Interventions: Systems analyst, Supportive  Summary:  Reflection Group: Patients encouraged to practice skills and interpersonal techniques or work on mindfulness and relaxation techniques. The importance of self-care and making skills part of a routine to increase usage were stressed   Therapist Response: Patient engaged and participated appropriately.        Session Time: 1:00- 2:00  Participation Level: Active  Behavioral Response: CasualAlertDepressed  Type of Therapy: Group Therapy, Psychoeducation  Treatment Goals addressed: Coping  Interventions: relaxation training; Supportive; Reframing  Summary: 12:45 - 1:50: Relaxation group: Cln led group focused on retraining the body's response to stress.   1:50 -2:00 Clinician led check-out. Clinician assessed for immediate needs, medication compliance and efficacy, and safety concerns   Therapist Response: Patient engaged in activity and discussion. At [place], patient rates her mood at a 5 on a scale of 1-10 with 10 being great. Patient reports afternoon plans of picking daughter up from school and to practice self-care. Patient demonstrates some progress as evidenced by scheduling self-care. Patient denies SI/HI/self-harm thoughts at the end of group.       Suicidal/Homicidal: Nowithout intent/plan  Plan: Patient will continue in PHP while working on decreasing depression and anxiety symptoms and SI, and increasing ability to manage symptoms in a healthy manner.   Diagnosis: Bipolar 1 disorder, depressed, severe ([place]) [F31.4]    1. Bipolar 1 disorder, depressed, severe ([place])   2. Generalized anxiety disorder     [name], LCSW 10/08/2018

## 2018-10-09 ENCOUNTER — Other Ambulatory Visit (HOSPITAL_BASED_OUTPATIENT_CLINIC_OR_DEPARTMENT_OTHER): Payer: Medicare Other | Admitting: Family

## 2018-10-09 ENCOUNTER — Ambulatory Visit (HOSPITAL_COMMUNITY): Payer: Self-pay

## 2018-10-09 ENCOUNTER — Other Ambulatory Visit (HOSPITAL_COMMUNITY): Payer: Self-pay

## 2018-10-09 DIAGNOSIS — R45851 Suicidal ideations: Secondary | ICD-10-CM | POA: Diagnosis not present

## 2018-10-09 DIAGNOSIS — F314 Bipolar disorder, current episode depressed, severe, without psychotic features: Secondary | ICD-10-CM

## 2018-10-09 DIAGNOSIS — F411 Generalized anxiety disorder: Secondary | ICD-10-CM | POA: Diagnosis not present

## 2018-10-09 NOTE — Progress Notes (Signed)
Psychiatric Initial Adult Assessment   Patient Identification: Christine [name] MRN:  [id number] Date of Evaluation:  10/09/2018 Referral Source: PHP Step down  Chief Complaint:  Bipolar Disorder Visit Diagnosis: No diagnosis found.  Evaluation: Ruta Hinds presents with a brighter affect during this assessment.  She continues to report mood fluctuations and irritability throughout the day.  Currently denying suicidal or homicidal ideations.  Patient reports she is planning to homeschool her daughter which will occupy her time.  Patient indicates chronic passive suicidal ideation.  Currently denying suicidal or homicidal ideations during this assessment.  Denies auditory or visual hallucinations.  Discussed titration with medications however patient reports reports her medications was recently titrated by her attending psychiatrist.  We will continue to monitor and make adjustments as needed.  Patient to start intensive outpatient programming on 10/09/2018   History of Present Illness:  Per admission assessment note:Patient is a42 y.o.African Americanfemalewith bipolar disorder, most recent episode depressed, severe who presents withdepression and anxiety. Patientrecently discharged on 09/12/2018 from inpatient behavioral health hospital. Patient was enrolled in partial psychiatric program on 2/12/2020Patient reports that she continues to struggle with her mood, knows that she needs to work on her coping skills, gets overwhelmed easily, feels isolated. Patient has that she does not have much of a social life, knows that she needs to join a church but has not done so. Patient has that her husband is supportive and states that she loves her 2 kids. Patient has that she is no longer having suicidal thoughts since her discharge from the hospital, feels she needs to work in a program so she is not suicidal again and has improvement in her depression and anxiety. On a scale of 0-10, with 0 being no symptoms  and 10being the worst, patient reports that her depression is an 8 out of 10 and her anxiety is a 7 out of 10 as she worries about her not getting better. Patient denies any symptoms of mania, any substance use  Associated Signs/Symptoms: Depression Symptoms:  depressed mood, feelings of worthlessness/guilt, difficulty concentrating, (Hypo) Manic Symptoms:  Distractibility, Impulsivity, Anxiety Symptoms:  Excessive Worry, Psychotic Symptoms:  Hallucinations: None PTSD Symptoms: NA  Past Psychiatric History:   Previous Psychotropic Medications: Yes   Substance Abuse History in the last 12 months:  No.  Consequences of Substance Abuse: NA  Past Medical History:  Past Medical History:  Diagnosis Date  . Anemia   . Anxiety   . Arthritis    Hands, ankles  . Back pain   . Bipolar 1 disorder ([place])   . Chest pain   . Depression   . Depression   . Dyspnea   . Fibroids   . Fibromyalgia   . Headache   . High blood pressure   . History of blood transfusion 08/2017   [facility]  . History of hiatal hernia   . Infertility, female   . Joint pain   . Lactose intolerance   . Lower extremity edema   . Mental disorder   . Migraines   . Osteoarthritis   . PCOS (polycystic ovarian syndrome)   . Seasonal allergies   . Vitamin D deficiency     Past Surgical History:  Procedure Laterality Date  . CERVICAL POLYPECTOMY  01/15/2018   Procedure: CERVICAL POLYPECTOMY;  Surgeon: Arvella Nigh, MD;  Location: [facility];  Service: Gynecology;;  . COLONOSCOPY  2008   Normal  . DIAGNOSTIC LAPAROSCOPY  08/1999   dermoid cyst, [facility]  . DILATION AND CURETTAGE OF  UTERUS  05/2004   [facility]  . DILATION AND CURETTAGE OF UTERUS  04/2015  . HYSTEROSCOPY W/D&C  07/22/2011   Procedure: DILATATION AND CURETTAGE /HYSTEROSCOPY;  Surgeon: Cyril Mourning, MD;  Location: [facility];  Service: Gynecology;;  . HYSTEROSCOPY W/D&C N/A 01/15/2018   Procedure: DILATATION AND CURETTAGE [place] WITH MYOSURE;  Surgeon:  Arvella Nigh, MD;  Location: [facility];  Service: Gynecology;  Laterality: N/A;  . 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 Anxiety disorder Mother   . Depression Mother   . Cancer Mother        pancreatic   . High blood pressure Mother   . Hypertension Mother   . Hyperlipidemia Mother   . Cancer Father        colon  . High blood pressure Father   . Diabetes Father   . Hypertension Father   . Hyperlipidemia Father   . Heart disease Father   . Stroke Father   . Kidney disease Father   . Sleep apnea Father   . Obesity Father   . Cancer Brother 6       leukemia, childhood  . High blood pressure Brother   . Migraines Neg Hx     Social History:   Social History   Socioeconomic History  . Marital status: Married    Spouse name: Taraann Olthoff  . Number of children: 2  . Years of education: 2 years of grad school + bach & HS  . Highest education level: Bachelor's degree (e.g., BA, AB, BS)  Occupational History  . Occupation: disabled  Social Needs  . Financial resource strain: Somewhat hard  . Food insecurity:    Worry: Sometimes true    Inability: Sometimes true  . Transportation needs:    Medical: No    Non-medical: No  Tobacco Use  . Smoking status: Never Smoker  . Smokeless tobacco: Never Used  Substance and Sexual Activity  . Alcohol use: Yes    Comment: occasional, maybe once a month or so  . Drug use: No  . Sexual activity: Yes    Partners: Male    Birth control/protection: None  Lifestyle  . Physical activity:    Days per week: 0 days    Minutes per session: Not on file  . Stress: Rather much  Relationships  . Social connections:    Talks on phone: Never    Gets together: Never    Attends religious service: Never    Active member of club or organization: No    Attends meetings of clubs or organizations: Never    Relationship status: Not on file   Other Topics Concern  . Not on file  Social History Narrative   Lives at home with her husband and daughters   Right handed   Caffeine: decreased intake, drinks rarely     Additional Social History:   Allergies:   Allergies  Allergen Reactions  . Penicillins Rash and Other (See Comments)    Has patient had a PCN reaction causing immediate rash, facial/tongue/throat swelling, SOB or lightheadedness with hypotension: Yes Has patient had a PCN reaction causing severe rash involving mucus membranes or skin necrosis: Unknown Has patient had a PCN reaction that required hospitalization: No Has patient had a PCN reaction occurring within the last 10 years: No If all of the above answers are "NO", then may proceed  with Cephalosporin use.     Metabolic Disorder Labs: Lab Results  Component Value Date   HGBA1C 5.7 (H) 10/07/2018   No results found for: PROLACTIN Lab Results  Component Value Date   CHOL 183 10/07/2018   TRIG 70 10/07/2018   HDL 38 (L) 10/07/2018   LDLCALC 131 (H) 10/07/2018   Lab Results  Component Value Date   TSH 1.260 10/07/2018    Therapeutic Level Labs: Lab Results  Component Value Date   LITHIUM 0.4 (L) 10/02/2018   No results found for: CBMZ No results found for: VALPROATE  Current Medications: Current Outpatient Medications  Medication Sig Dispense Refill  . amLODipine (NORVASC) 10 MG tablet Take 1 tablet (10 mg total) by mouth daily. 30 tablet 0  . ARIPiprazole (ABILIFY) 10 MG tablet Take 1 tablet (10 mg total) by mouth daily. For mood 30 tablet 1  . clonazePAM (KLONOPIN) 0.5 MG tablet Take 0.5 mg by mouth 2 (two) times daily as needed for anxiety.    [name] (AIMOVIG) 140 MG/ML SOAJ Inject 140 mg into the skin every 30 (thirty) days. (Patient not taking: Reported on 09/24/2018) 1 pen 11  . lamoTRIgine (LAMICTAL) 150 MG tablet Take 1 tablet (150 mg total) by mouth 2 (two) times daily. For mood 60 tablet 1  . lithium 600 MG capsule Take 1  capsule (600 mg total) by mouth 2 (two) times daily with a meal. For mood 60 capsule 1  . metoprolol succinate (TOPROL-XL) 50 MG 24 hr tablet Take 50 mg by mouth daily. Take with or immediately following a meal.     No current facility-administered medications for this visit.     Musculoskeletal: Strength & Muscle Tone: within normal limits Gait & Station: normal Patient leans: N/A  Psychiatric Specialty Exam: ROS  Last menstrual period 10/05/2018.There is no height or weight on file to calculate BMI.  General Appearance: Casual  Eye Contact:  Good  Speech:  Clear and Coherent  Volume:  Normal  Mood:  Anxious and Depressed  Affect:  Congruent  Thought Process:  Coherent  Orientation:  Full (Time, Place, and Person)  Thought Content:  WDL  Suicidal Thoughts:  No  Homicidal Thoughts:  No  Memory:  Immediate;   Fair Recent;   Fair  Judgement:  Fair  Insight:  Fair  Psychomotor Activity:  Normal  Concentration:  Concentration: Fair  Recall:  [employer] of Knowledge:Fair  Language: Fair  Akathisia:  No  Handed:  Right  AIMS (if indicated):    Assets:  Desire for Improvement  ADL's:  Intact  Cognition: WNL  Sleep:  Fair   Screenings: AIMS     Admission (Discharged) from 09/08/2018 in [facility]  AIMS Total Score  0    AUDIT     Admission (Discharged) from 09/08/2018 in [facility]  Alcohol Use Disorder Identification Test Final Score (AUDIT)  1    GAD-7     Counselor from 10/02/2018 in [place] Counselor from 09/09/2017 in [place] Counselor from 04/04/2017 in [place] Counselor from 03/18/2017 in [place]  Total GAD-7 Score  14  16  7  18     PHQ2-9     Office Visit from 10/07/2018 in [place] Most recent reading at 10/07/2018   9:49 AM Counselor from 09/16/2018 in [place] Most recent reading  at 09/16/2018 12:34 PM Counselor from 10/02/2018 in [place] Most recent reading at 09/16/2018  9:00 AM Counselor from 09/26/2017 in [place] Most recent reading at 09/26/2017 10:12 AM Counselor from 09/19/2017 in [place] Most recent reading at 09/19/2017 10:03 AM  PHQ-2 Total Score  5  6  6  3  4   PHQ-9 Total Score  18  16  21  12  16       Assessment and Plan:  Admitted to Intensive Outpatient Program IOP  Discussed titration with Abilify 10 mg to 15 mg   Treatment plane was reviewed and agreed upon by NP. Myrle Sheng and patient Arnette Felts, NP 3/6/20209:00 AM

## 2018-10-09 NOTE — Progress Notes (Signed)
Christine [name] is a 43 y.o., married, unemployed African American female who transitioned from [facility].  Pt states she was in [facility] for three weeks.  As per previous CCA states:  Pt presents as PHP referral from her OP therapist, Binnie Rail. Pt is known to this writer due to past admission in [place] approximately 03/2017 and 09/2017. Pt was inpt due to suicidal ideation with plan last week. Pt states "I didn't get anything from inpt because they were short staffed." Pt reports an increase of depression symptoms preventing her from completing ADL's and creating increased hopelessness. Pt reports current passive SI w/o intent or plan. Pt reports many current stressors including recent death of mother 2023/06/02), financial strain, feeling worthless due to "not having a purpose or job", and strained relationships with family since mother's death. Pt has regular therapist and psychiatrist within agency and is happy with services however feels, along with her therapist, that more intensive services are needed. Pt denies active SI/HI/AVH however endorses passive SI. Pt started MH-IOP today.  Affect was labile.  She was tearful off and on with Probation officer, stating that is how her mood is.  Pt is looking forward to home schooling her daughter starting today. Admits to passive SI.  Denies a plan or intent.  Discussed safety options at length with pt.  Pt was able to contract for safety.  Denies HI or A/V hallucinations.  A:  Reoriented pt to MH-IOP.  Will inform [name] and Binnie Rail, LCAS of admit.  Encouraged support groups.  R:  Pt receptive.     Carlis Abbott, RITA, M.Ed,CNA

## 2018-10-12 ENCOUNTER — Ambulatory Visit (HOSPITAL_COMMUNITY): Payer: Self-pay

## 2018-10-12 ENCOUNTER — Other Ambulatory Visit (HOSPITAL_COMMUNITY): Payer: Medicare Other | Admitting: Licensed Clinical Social Worker

## 2018-10-12 ENCOUNTER — Other Ambulatory Visit (HOSPITAL_COMMUNITY): Payer: Self-pay

## 2018-10-12 DIAGNOSIS — F3162 Bipolar disorder, current episode mixed, moderate: Secondary | ICD-10-CM

## 2018-10-13 ENCOUNTER — Ambulatory Visit (HOSPITAL_COMMUNITY): Payer: Self-pay

## 2018-10-13 ENCOUNTER — Other Ambulatory Visit (HOSPITAL_COMMUNITY): Payer: Medicare Other | Admitting: Family

## 2018-10-13 ENCOUNTER — Ambulatory Visit (HOSPITAL_COMMUNITY): Payer: Medicare Other | Admitting: Psychiatry

## 2018-10-13 ENCOUNTER — Encounter (HOSPITAL_COMMUNITY): Payer: Self-pay

## 2018-10-13 DIAGNOSIS — F314 Bipolar disorder, current episode depressed, severe, without psychotic features: Secondary | ICD-10-CM | POA: Diagnosis not present

## 2018-10-13 DIAGNOSIS — F419 Anxiety disorder, unspecified: Secondary | ICD-10-CM | POA: Diagnosis not present

## 2018-10-13 DIAGNOSIS — Z7901 Long term (current) use of anticoagulants: Secondary | ICD-10-CM | POA: Diagnosis not present

## 2018-10-13 DIAGNOSIS — R4587 Impulsiveness: Secondary | ICD-10-CM | POA: Diagnosis not present

## 2018-10-13 DIAGNOSIS — R030 Elevated blood-pressure reading, without diagnosis of hypertension: Secondary | ICD-10-CM | POA: Diagnosis not present

## 2018-10-13 DIAGNOSIS — Z79899 Other long term (current) drug therapy: Secondary | ICD-10-CM | POA: Diagnosis not present

## 2018-10-13 DIAGNOSIS — M797 Fibromyalgia: Secondary | ICD-10-CM | POA: Diagnosis not present

## 2018-10-13 DIAGNOSIS — R600 Localized edema: Secondary | ICD-10-CM | POA: Diagnosis not present

## 2018-10-13 DIAGNOSIS — Z8249 Family history of ischemic heart disease and other diseases of the circulatory system: Secondary | ICD-10-CM | POA: Diagnosis not present

## 2018-10-13 DIAGNOSIS — R45851 Suicidal ideations: Secondary | ICD-10-CM | POA: Diagnosis not present

## 2018-10-13 DIAGNOSIS — R454 Irritability and anger: Secondary | ICD-10-CM | POA: Diagnosis not present

## 2018-10-13 DIAGNOSIS — F3162 Bipolar disorder, current episode mixed, moderate: Secondary | ICD-10-CM

## 2018-10-13 MED ORDER — LITHIUM CARBONATE 300 MG PO CAPS: ORAL_CAPSULE | ORAL | 0 refills | Status: DC

## 2018-10-13 MED ORDER — LAMOTRIGINE 150 MG PO TABS: 150.0000 mg | ORAL_TABLET | Freq: Two times a day (BID) | ORAL | 1 refills | Status: DC

## 2018-10-13 MED ORDER — ARIPIPRAZOLE 15 MG PO TABS: 15.0000 mg | ORAL_TABLET | Freq: Every day | ORAL | 0 refills | Status: DC

## 2018-10-13 MED ORDER — LAMOTRIGINE 100 MG PO TABS: 100.0000 mg | ORAL_TABLET | Freq: Every day | ORAL | 2 refills | Status: DC

## 2018-10-13 NOTE — Progress Notes (Signed)
[name]  10/13/2018 10:18 AM Christine [name]  MRN:  [id number]   Evaluation: Christine [name] presents flat, guarded but pleasant. Continues to report  mood fluctuations with mood irritability.  States last week she was very tearful and depressed. Reports she is unable to take SSRI's due to adverse reactions. Currently denying suicidal ideations with intent or plan.  Denies homicidal ideations during this assessment.  Reports " if I do not wake up that will be okay."  However has chronic report passive ideations.  Discussed titration to Abilify 10 mg to 50 mg and will titrate lithium 600 mg twice daily to 900 mg nightly and continue 600 mg daily. Li level 0.4 2/26  NP consulted with patient's attending psychiatrist [name].  Patient was agreeable to plan.  We will continue to monitor for safety.  Support encouragement reassurance was provided.   Visit Diagnosis: No diagnosis found.  Past Psychiatric History:  Past Medical History:  Past Medical History:  Diagnosis Date  . Anemia   . Anxiety   . Arthritis    Hands, ankles  . Back pain   . Bipolar 1 disorder ([place])   . Chest pain   . Depression   . Depression   . Dyspnea   . Fibroids   . Fibromyalgia   . Headache   . High blood pressure   . History of blood transfusion 08/2017   [facility]  . History of hiatal hernia   . Infertility, female   . Joint pain   . Lactose intolerance   . Lower extremity edema   . Mental disorder   . Migraines   . Osteoarthritis   . PCOS (polycystic ovarian syndrome)   . Seasonal allergies   . Vitamin D deficiency     Past Surgical History:  Procedure Laterality Date  . CERVICAL POLYPECTOMY  01/15/2018   Procedure: CERVICAL POLYPECTOMY;  Surgeon: Arvella Nigh, MD;  Location: [facility];  Service: Gynecology;;  . COLONOSCOPY  2008   Normal  . DIAGNOSTIC LAPAROSCOPY  08/1999   dermoid cyst, [facility]  . DILATION AND CURETTAGE OF UTERUS  05/2004   [facility]  . DILATION AND CURETTAGE OF UTERUS   04/2015  . HYSTEROSCOPY W/D&C  07/22/2011   Procedure: DILATATION AND CURETTAGE /HYSTEROSCOPY;  Surgeon: Cyril Mourning, MD;  Location: [facility];  Service: Gynecology;;  . HYSTEROSCOPY W/D&C N/A 01/15/2018   Procedure: DILATATION AND CURETTAGE [place] WITH MYOSURE;  Surgeon: Arvella Nigh, MD;  Location: [facility];  Service: Gynecology;  Laterality: N/A;  . 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 Anxiety disorder Mother   . Depression Mother   . Cancer Mother        pancreatic   . High blood pressure Mother   . Hypertension Mother   . Hyperlipidemia Mother   . Cancer Father        colon  . High blood pressure Father   . Diabetes Father   . Hypertension Father   . Hyperlipidemia Father   . Heart disease Father   . Stroke Father   . Kidney disease Father   . Sleep apnea Father   . Obesity Father   . Cancer Brother 6       leukemia, childhood  . High blood pressure Brother   . Migraines Neg Hx     Social History:  Social History  Socioeconomic History  . Marital status: Married    Spouse name: Adore Kithcart  . Number of children: 2  . Years of education: 2 years of grad school + bach & HS  . Highest education level: Bachelor's degree (e.g., BA, AB, BS)  Occupational History  . Occupation: disabled  Social Needs  . Financial resource strain: Somewhat hard  . Food insecurity:    Worry: Sometimes true    Inability: Sometimes true  . Transportation needs:    Medical: No    Non-medical: No  Tobacco Use  . Smoking status: Never Smoker  . Smokeless tobacco: Never Used  Substance and Sexual Activity  . Alcohol use: Yes    Comment: occasional, maybe once a month or so  . Drug use: No  . Sexual activity: Yes    Partners: Male    Birth control/protection: None  Lifestyle  . Physical activity:    Days per week: 0 days    Minutes per session: Not on file  .  Stress: Rather much  Relationships  . Social connections:    Talks on phone: Never    Gets together: Never    Attends religious service: Never    Active member of club or organization: No    Attends meetings of clubs or organizations: Never    Relationship status: Not on file  Other Topics Concern  . Not on file  Social History Narrative   Lives at home with her husband and daughters   Right handed   Caffeine: decreased intake, drinks rarely     Allergies:  Allergies  Allergen Reactions  . Penicillins Rash and Other (See Comments)    Has patient had a PCN reaction causing immediate rash, facial/tongue/throat swelling, SOB or lightheadedness with hypotension: Yes Has patient had a PCN reaction causing severe rash involving mucus membranes or skin necrosis: Unknown Has patient had a PCN reaction that required hospitalization: No Has patient had a PCN reaction occurring within the last 10 years: No If all of the above answers are "NO", then may proceed with Cephalosporin use.     Metabolic Disorder Labs: Lab Results  Component Value Date   HGBA1C 5.7 (H) 10/07/2018   No results found for: PROLACTIN Lab Results  Component Value Date   CHOL 183 10/07/2018   TRIG 70 10/07/2018   HDL 38 (L) 10/07/2018   LDLCALC 131 (H) 10/07/2018   Lab Results  Component Value Date   TSH 1.260 10/07/2018   TSH 1.460 04/28/2018    Therapeutic Level Labs: Lab Results  Component Value Date   LITHIUM 0.4 (L) 10/02/2018   LITHIUM 0.85 09/08/2018   No results found for: VALPROATE No components found for:  CBMZ  Current Medications: Current Outpatient Medications  Medication Sig Dispense Refill  . amLODipine (NORVASC) 10 MG tablet Take 1 tablet (10 mg total) by mouth daily. 30 tablet 0  . ARIPiprazole (ABILIFY) 10 MG tablet Take 1 tablet (10 mg total) by mouth daily. For mood 30 tablet 1  . clonazePAM (KLONOPIN) 0.5 MG tablet Take 0.5 mg by mouth 2 (two) times daily as needed for  anxiety.    [name] (AIMOVIG) 140 MG/ML SOAJ Inject 140 mg into the skin every 30 (thirty) days. (Patient not taking: Reported on 09/24/2018) 1 pen 11  . lamoTRIgine (LAMICTAL) 150 MG tablet Take 1 tablet (150 mg total) by mouth 2 (two) times daily. For mood 60 tablet 1  . lithium 600 MG capsule Take 1 capsule (600 mg  total) by mouth 2 (two) times daily with a meal. For mood 60 capsule 1  . metoprolol succinate (TOPROL-XL) 50 MG 24 hr tablet Take 50 mg by mouth daily. Take with or immediately following a meal.     No current facility-administered medications for this visit.      Musculoskeletal: Strength & Muscle Tone: within normal limits Gait & Station: normal Patient leans: N/A  Psychiatric Specialty Exam: Review of Systems  Psychiatric/Behavioral: Positive for depression and suicidal ideas (chronic passive ideations). The patient is nervous/anxious.   All other systems reviewed and are negative.   Last menstrual period 10/05/2018.There is no height or weight on file to calculate BMI.  General Appearance: Guarded  Eye Contact:  Good  Speech:  Clear and Coherent  Volume:  Normal  Mood:  Anxious and Depressed  Affect:  Congruent  Thought Process:  Coherent  Orientation:  Full (Time, Place, and Person)  Thought Content: Hallucinations: None   Suicidal Thoughts:  No  Homicidal Thoughts:  No  Memory:  Immediate;   Fair Recent;   Fair Remote;   Fair  Judgement:  Fair  Insight:  Fair  Psychomotor Activity:  Normal  Concentration:  Concentration: Fair  Recall:  [employer] of Knowledge: Fair  Language: Fair  Akathisia:  No  Handed:  Right  AIMS (if indicated):  Assets:  Communication Skills Desire for Improvement Resilience Social Support  ADL's:  Intact  Cognition: WNL  Sleep:  Fair   Screenings: AIMS     Admission (Discharged) from 09/08/2018 in [facility]  AIMS Total Score  0    AUDIT     Admission (Discharged) from  09/08/2018 in [facility]  Alcohol Use Disorder Identification Test Final Score (AUDIT)  1    GAD-7     Counselor from 10/02/2018 in [facility] Counselor from 09/09/2017 in [facility] Counselor from 04/04/2017 in [facility] Counselor from 03/18/2017 in [facility]  Total GAD-7 Score  14  16  7  18     PHQ2-9     Office Visit from 10/07/2018 in [facility] Most recent reading at 10/07/2018  9:49 AM Counselor from 09/16/2018 in [facility] Most recent reading at 09/16/2018 12:34 PM Counselor from 10/02/2018 in [facility] Most recent reading at 09/16/2018  9:00 AM Counselor from 09/26/2017 in [facility] Most recent reading at 09/26/2017 10:12 AM Counselor from 09/19/2017 in [facility] Most recent reading at 09/19/2017 10:03 AM  PHQ-2 Total Score  5  6  6  3  4   PHQ-9 Total Score  18  16  21  12  16        Assessment and Plan:  Continue Intensive outpatient program (IOP) Patient to increase Abilify 10 mg to Abilify 15 p.o. daily Increase lithium  600 mg p.o. twice daily to 600 every morning and 900 nightly Li level 0.4 on 2/28  Refilled Lamictal 150 twice daily  -Patient reports her insurance no longer covers [name] will continue titration with Abilify.  Treatment plan was reviewed and agreed upon by NP T. Lewis and patient [name] Knust's need for continued group services   Derrill Center, NP 10/13/2018, 10:18 AM

## 2018-10-13 NOTE — Psych (Signed)
[facility] BH PHP THERAPIST PROGRESS NOTE  Christine [name] [id number]  Session Time: 9:00 - 11:00  Participation Level: Active  Behavioral Response: CasualAlertAnxiousDepressed  Type of Therapy: Group Therapy; Psychotherapy  Treatment Goals addressed: Coping  Interventions: CBT, DBT, Solution Focused, Supportive and Reframing  Summary: Clinician led check-in regarding current stressors and situation, and review of patient completed daily inventory. Clinician utilized active listening and empathetic response and validated patient emotions. Clinician facilitated processing group on pertinent issues.  Therapist Response: Patient arrived within time allowed and reports that she is feeling "blah, not good not bad". Patient rates her mood at a 5 on a scale of 1-10 with 10 being great. Pt. reports she felt irritable yesterday and some of that has lingered into today. Pt states she was frustrated because she wasn't able to get her blood drawn and changes are not happening as quickly as she wants them to. Pt. states she napped for about 45 minutes in the afternoon, stating "I don't like to nap because I don't feel rested" but she is feeling very tired lately and falls asleep unintentionally. Pt. states she watched T.V. as a distraction and also pet the cat which helped her to self-soothe her irritation. Patient continues to struggles with living in the moment. Patient engaged in discussion.       Session Time: 11:00 -12:00   Participation Level: Active   Behavioral Response: CasualAlertDepressed   Type of Therapy: Group Therapy, OT   Treatment Goals addressed: Coping   Interventions: Psychosocial skills training, Supportive,    Summary:  Occupational Therapy group   Therapist Response: Patient engaged in group. See OT note.            Session Time: 12:00 - 12:45  Participation Level: Active  Behavioral Response: CasualAlertDepressed  Type of Therapy: Group Therapy, Activity  Therapy  Treatment Goals addressed: Coping  Interventions: Systems analyst, Supportive  Summary:  Reflection Group: Patients encouraged to practice skills and interpersonal techniques or work on mindfulness and relaxation techniques. The importance of self-care and making skills part of a routine to increase usage were stressed   Therapist Response: Patient engaged and participated appropriately.         Session Time: 12:45 - 2:00  Participation Level: Active  Behavioral Response: CasualAlertAnxiousDepressed  Type of Therapy: Group Therapy; Psychotherapy; Psychoeducation  Treatment Goals addressed: Coping  Interventions: CBT, DBT, Solution Focused, Supportive and Reframing  Summary: 12:45 - 1:50: Cln continued topic of cognitive distortions. Group discussed how to "challenge" the unhealthy thought patterns once recognized. Group reviewed "Socratic Questions" handout and went over how to challenge irrational thoughts using that handout. 1:50 -2:00 Clinician led check-out. Clinician assessed for immediate needs, medication compliance and efficacy, and safety concerns   Therapist Response: Patient engaged in activity. Pt states understanding of how to challenge unhealthy thoughts and successfully reframed examples in discussion.  At [place], patient rates her mood at a 5 on a scale of 1-10 with 10 being great. Patient reports afternoon plans of picking up daughters from school and knitting. Patient demonstrates some progress as evidenced by using evidence to challenge negative thoughts when cued in group. Patient denies SI/HI/self-harm thoughts at the end of group.       Suicidal/Homicidal: Nowithout intent/plan  Plan: Patient will continue in PHP while working on decreasing depression and anxiety symptoms and SI, and increasing ability to manage symptoms in a healthy manner.   Diagnosis: Bipolar 1 disorder, depressed, severe ([place]) [F31.4]    1. Bipolar  1  disorder, depressed, severe ([place])   2. Generalized anxiety disorder     [name], LCSW 10/13/2018

## 2018-10-14 ENCOUNTER — Other Ambulatory Visit (HOSPITAL_COMMUNITY): Payer: Medicare Other

## 2018-10-14 ENCOUNTER — Other Ambulatory Visit (HOSPITAL_COMMUNITY): Payer: Self-pay

## 2018-10-14 NOTE — Progress Notes (Signed)
    Daily Group Progress Note  Program: IOP  Group Time: 9am-12pm  Participation Level: Active  Behavioral Response: Appropriate  Type of Therapy:  Group Therapy  Summary of Progress:  The purpose of this group is to utilize CBT and DBT skills in a group setting to increase use of healthy coping skills and decrease frequency and intensity of active mental health symtpoms.  9am-10:30am Clinician presented Happy Brain: How to Overcome Our Neural Predispositions to Suffering by Dr. Lauris Poag.  Clinician presented psycho-educational information on automatic thinking and neural predispositions. Group members discussed negative adaptations including mind wandering, negativity bias. Educational information also included skills to improve moments of happiness by focusing on gratitude skills, non-judgmental stance, and reframing challenges to focus on compassion, acceptance, meaning, and forgiveness. Clinician and group members discussed recent life stressors and how these skills could be applied.  10:30am-12pm Clinician presented the topic of mindfulness in conjunction with psycho-educational materials focused vs wandering modes of thinking. Clinician and group members discussed core features of mindfulness and barriers for implementing. Clinician and group members practiced skill in session by participating in [place] meditation, with a focus on bringing the wandering mind back to the task in progress. Clinician and group members practiced being non-judgmental and participating fully by engaging in 'loaded questions' activity and showing moments of vulnerability which can be used as connection with others. Client engaged in discussion and activities. Client notes frequent mind wandering. Client is open to adding moments of mindfulness into her day and states she can attempt this skill during cooking.   Olegario Messier, LCSW

## 2018-10-14 NOTE — Psych (Signed)
   [facility] BH PHP THERAPIST PROGRESS NOTE  Surabhi Gadea [id number]  Session Time: 9:00 - 10:15  Participation Level: Active  Behavioral Response: CasualAlertAnxiousDepressed  Type of Therapy: Group Therapy; Psychotherapy  Treatment Goals addressed: Coping  Interventions: CBT, DBT, Solution Focused, Supportive and Reframing  Summary: Clinician led check-in regarding current stressors and situation, and review of patient completed daily inventory. Clinician utilized active listening and empathetic response and validated patient emotions. Clinician facilitated processing group on pertinent issues.  Therapist Response: Patient arrived within time allowed and reports that she is feeling "good." Patient rates her mood at a 6 on a scale of 1-10 with 10 being great. Pt. reports she slept well and feels rested this morning. Pt. states she took anxiety medication and had a very busy afternoon yesterday and believes that helped with sleep. Pt. states thing went well this morning with getting girls ready for school which is improvement. Pt. states she received a call from neurologist, and the doctor was able to get pt an appointment at weight clinic yesterday afternoon. Pt. went to appointment and will be attending orientation next week. Pt states, "I am very excited about it" and is looking forward to the orientation. Patient continues to struggle with managing expectations. Patient engaged in discussion.         Session Time: 10:15 -11:00   Participation Level: Active   Behavioral Response: CasualAlertDepressed   Type of Therapy: Group Therapy, psychoeducation, psychotherapy   Treatment Goals addressed: Coping   Interventions: CBT, DBT, Solution Focused, Supportive and Reframing   Summary: Clinician led discussion on managing stressors during difficult and anxious situations. Patients discussed areas where they feel they could incorporate distraction skills to manage stress and  anxiety.     Therapist Response: Patient engaged and participated in discussion. Pt. identifies stressor of lack of sleep, states she can utilize sleep hygiene skills such as setting a bedtime routine and playing relaxing music in bed to manage stressor.         Session Time: 11:00 -12:00  Participation Level:Active  Behavioral Response:CasualAlertDepressed  Type of Therapy: Group Therapy, OT  Treatment Goals addressed: Coping  Interventions:Psychosocial skills training, Supportive,   Summary:Occupational Therapy group  Therapist Response: Patient engaged in group. See OT note.        Session Time: 12:00- 1:00  Participation Level:Active  Behavioral Response:CasualAlertDepressed  Type of Therapy: GroupTherapy, Psychoeducation, psychotherapy  Treatment Goals addressed: Coping  Interventions:CBT, DBT, solution focused, Supportive; Reframing  Summary: 12:00 - 12:50: Clinician continued the topic of cognitive distortions. Group watched Clare Gandy Talk illustrating ways to reframe and shift perspective.   12:50 -1:00 Clinician led check-out. Clinician assessed for immediate needs, medication compliance and efficacy, and safety concerns    Therapist Response: Patient engaged in discussion.  At [place], patient rates her mood at a 7 on a scale of 1-10 with 10 being great. Patient reports weekend plans to clean cluttered section of house and go out with family. Patient demonstrates some progress as evidenced by setting goals and tasks. Patient denies SI/HI/self-harm at the end of group.         Suicidal/Homicidal: Nowithout intent/plan  Plan: Patient will continue in PHP while working on decreasing depression and anxiety symptoms and SI, and increasing ability to manage symptoms in a healthy manner.   Diagnosis: Bipolar 1 disorder, depressed, severe ([place]) [F31.4]    1. Bipolar 1 disorder, depressed, severe ([place])   2.  Generalized anxiety disorder     [name], LCSW 10/14/2018

## 2018-10-15 ENCOUNTER — Ambulatory Visit (HOSPITAL_COMMUNITY): Payer: Self-pay

## 2018-10-15 ENCOUNTER — Other Ambulatory Visit (HOSPITAL_COMMUNITY): Payer: Self-pay

## 2018-10-15 ENCOUNTER — Other Ambulatory Visit: Payer: Self-pay

## 2018-10-15 ENCOUNTER — Other Ambulatory Visit (HOSPITAL_COMMUNITY): Payer: Medicare Other | Admitting: Licensed Clinical Social Worker

## 2018-10-15 DIAGNOSIS — F3162 Bipolar disorder, current episode mixed, moderate: Secondary | ICD-10-CM

## 2018-10-15 NOTE — Progress Notes (Signed)
    Daily Group Progress Note  Program: IOP  Group Time: 9am-12pm  Participation Level: Active  Behavioral Response: Appropriate  Type of Therapy:  Group Therapy; psycho-educational group, process group  Summary of Progress:  The purpose of this group is to utilize CBT and DBT skills in a group setting to increase use of healthy coping skills and decrease frequency and intensity of active mental health symptoms.  9am-10:30am Clinician checked in with group members, assessing for SI/HI/psychosis and overall level of functioning. Clinician inquired about completed self care activities from previous day. Clinician presented the topic of Mind Traps. Clinician reviewed with clients the connection between thoughts, feelings, and behaviors. Clinician emphasized that mind traps are learned ways of reacting to events in life, much like other patterns or habits and it is possible for [place] to learn new ways of thinking that help [place] avoid many of the emotional upheavals caused by our own mind traps. Clinician and group members reviewed each mind trap, provided examples from daily life, and practiced challenging each trap. Clinician and group members processed the impact of mind traps on mental health recovery. Clinician presented Progressive Muscle Relaxation activity for group members to participate. Clinician and group members compared this with yoga and the pebble meditation.  10:30am-12pm Clinician presented Roadblocks to Healthy Thinking that can interfere with change and contribute to relapse. Clinician and group members processed what Roadblocks have in common, why we use these, and the impact these have on relationships and mental health recovery. Clinician and group members utilized personal examples of how to address each type of roadblock and practice developing new thinking habits. Clinician validated client feelings that changing thought processing can be frustrating and is a long term solution with  repetitive practice. Clinician and group members reviewed Thinking and Behavior Cycle and focused on ways to break cycle of permission statements and unhelpful behaviors. Clinician checked out with group members planned self care activity for the day.  Client acknowledged she struggles with guilt trap and 'knows what to do, just is hard to actually do it."    Olegario Messier, LCSW

## 2018-10-15 NOTE — Progress Notes (Signed)
    Daily Group Progress Note  Program: IOP  Group Time: 9am-12pm  Participation Level: Active  Behavioral Response: Appropriate  Type of Therapy:  Group Therapy; psycho-educational group, process group  Summary of Progress:  The purpose of this group is to utilize CBT and DBT skills in a group setting to increase use of healthy coping skills and decrease frequency and intensity of active mental health symptoms.  9am-10:30am Clinician checked in with group members, assessing for SI/HI/psychosis and overall level of functioning. Clinician inquired about completed self-care activity from the previous day. Psycho-educational portion of group led by pharmacist. Pharmacist provided psychoeducation on classes of medications and symptoms addressed. Time was allowed for clients to ask questions of pharmacist.  10:30am-12pm Clinician presented the topic of Cristela Blue Mind with a task to improve focus. Clinician and group members discussed Emotional, Rational, and Wise mind, and the effect of decision making based on each. Clinician and group members discussed where each could be useful or harmful. Clinician presented the topic of maintaining focus, expanding on the content of what the group tended to think about at times of focus loss. Clinician reminded clients of CBT triangle and connection of thoughts, feelings, and behaviors. Clinician validated the universal tendency of individuals to mind wander and emphasized the importance of assessing their thoughts for thought traps and the amount of work necessary to reverse the effects of negative thoughts. Clinician presented strategies to maintain focus and facilitated discussion with clients about their successes and personal applicability. Clinician checked out requesting clients share a self-care activity planned for the day. Client has made progress in therapy as evidenced by her ability to identify thoughts that emerge during loss of focus and her preference  for using an emotional mind for herself and not for others because they are "more important". Client continues to struggle with incorporating self care as reported during her check in. Client actively participated in group.    Olegario Messier, LCSW

## 2018-10-16 ENCOUNTER — Other Ambulatory Visit (HOSPITAL_COMMUNITY): Payer: Medicare Other | Admitting: Psychiatry

## 2018-10-16 ENCOUNTER — Telehealth (HOSPITAL_COMMUNITY): Payer: Self-pay | Admitting: Psychiatry

## 2018-10-16 ENCOUNTER — Ambulatory Visit (HOSPITAL_COMMUNITY): Payer: Self-pay

## 2018-10-16 ENCOUNTER — Other Ambulatory Visit (HOSPITAL_COMMUNITY): Payer: Self-pay

## 2018-10-18 ENCOUNTER — Encounter (HOSPITAL_COMMUNITY): Payer: Self-pay | Admitting: Family

## 2018-10-19 ENCOUNTER — Other Ambulatory Visit (HOSPITAL_COMMUNITY): Payer: Medicare Other | Admitting: Psychiatry

## 2018-10-19 ENCOUNTER — Telehealth (HOSPITAL_COMMUNITY): Payer: Self-pay | Admitting: Psychiatry

## 2018-10-20 ENCOUNTER — Ambulatory Visit
Admission: RE | Admit: 2018-10-20 | Discharge: 2018-10-20 | Disposition: A | Discharge status: Home / Self Care | Payer: Medicare Other | Source: Ambulatory Visit | Attending: Neurology | Admitting: Neurology

## 2018-10-20 ENCOUNTER — Other Ambulatory Visit: Payer: Self-pay

## 2018-10-20 ENCOUNTER — Encounter (HOSPITAL_COMMUNITY): Payer: Self-pay

## 2018-10-20 ENCOUNTER — Other Ambulatory Visit (HOSPITAL_COMMUNITY): Payer: Medicare Other | Admitting: Psychiatry

## 2018-10-20 ENCOUNTER — Encounter (HOSPITAL_COMMUNITY): Payer: Self-pay | Admitting: Family

## 2018-10-20 DIAGNOSIS — R51 Headache with orthostatic component, not elsewhere classified: Secondary | ICD-10-CM

## 2018-10-20 DIAGNOSIS — G4484 Primary exertional headache: Secondary | ICD-10-CM

## 2018-10-20 DIAGNOSIS — H532 Diplopia: Secondary | ICD-10-CM

## 2018-10-20 DIAGNOSIS — H539 Unspecified visual disturbance: Secondary | ICD-10-CM

## 2018-10-20 DIAGNOSIS — I671 Cerebral aneurysm, nonruptured: Secondary | ICD-10-CM

## 2018-10-20 DIAGNOSIS — R519 Headache, unspecified: Secondary | ICD-10-CM

## 2018-10-20 MED ORDER — GADOBENATE DIMEGLUMINE 529 MG/ML IV SOLN
20.0000 mL | Freq: Once | INTRAVENOUS | Status: AC | PRN
  Administered 2018-10-20: 20 mL via INTRAVENOUS

## 2018-10-20 MED ORDER — LITHIUM CARBONATE 300 MG PO CAPS: ORAL_CAPSULE | ORAL | 0 refills | Status: DC

## 2018-10-20 MED ORDER — LAMOTRIGINE 100 MG PO TABS: 100.0000 mg | ORAL_TABLET | Freq: Every day | ORAL | 2 refills | Status: DC

## 2018-10-20 MED ORDER — ARIPIPRAZOLE 15 MG PO TABS: 15.0000 mg | ORAL_TABLET | Freq: Every day | ORAL | 0 refills | Status: DC

## 2018-10-20 MED ORDER — LAMOTRIGINE 150 MG PO TABS: 150.0000 mg | ORAL_TABLET | Freq: Two times a day (BID) | ORAL | 1 refills | Status: DC

## 2018-10-20 NOTE — Progress Notes (Unsigned)
  [name] Discharge Summary  Christine [name] [id number]  Admission date: 10/09/2018 Discharge date: 10/20/2018  Reason for admission: Per admission assessment note:Patient is a42 y.o.African Americanfemalewith bipolar disorder, most recent episode depressed, severe who presents withdepression and anxiety. Patientrecently discharged on 09/12/2018 from inpatient behavioral health hospital. Patient was enrolled in partial psychiatric program on 2/12/2020Patient reports that she continues to struggle with her mood, knows that she needs to work on her coping skills, gets overwhelmed easily, feels isolated. Patient has that she does not have much of a social life, knows that she needs to join a church but has not done so. Patient has that her husband is supportive and states that she loves her 2 kids. Patient has that she is no longer having suicidal thoughts since her discharge from the hospital, feels she needs to work in a program so she is not suicidal again and has improvement in her depression and anxiety. On a scale of 0-10, with 0 being no symptoms and 10being the worst, patient reports that her depression is an 8 out of 10 and her anxiety is a 7 out of 10 as she worries about her not getting better. Patient denies any symptoms of mania, any substance use  Chemical Use History: denied  Family of Origin Issues: Report that her husband and child has been supportive during this treatment admission.   Progress in Program Toward Treatment Goals: Ongoing, patient attended and participated with daily group sessions. Christine [name] has completed partial hospitalization and had decided to discontinue her session with in intensive outpatient programing due to COV19 and was reported that she is home schooling at this time.   Progress (rationale): Keep follow-up with MD Christine [name] 11/06/18 @ 10:30 Christine [name]  3/37/20 @ 10 am   -Resent medication to the pharmacy (  patient reported medication was unsuccessful)   - Li level 0.4. on 10/03/2018 with titrate lithium 600 mg BID to Lithium 900 mg BID  and increased Abilify 10 mg to 15 mg- as case was staffed with MD Christine [name] patient primary psychiatrist. As patient reports she is unable to afford the Lipan, due to her insurance.   Take all medications as prescribed. Keep all follow-up appointments as scheduled.  Do not consume alcohol or use illegal drugs while on prescription medications. Report any adverse effects from your medications to your primary care provider promptly.  In the event of recurrent symptoms or worsening symptoms, call 911, a crisis hotline, or go to the nearest emergency department for evaluation.  Christine [place], NP 10/20/2018

## 2018-10-20 NOTE — Patient Instructions (Signed)
D:  Patient requesting discharge today d/t Coronavirus precautions.  A:  Discharge today.  Follow up with Lise Auer, LCSW on 11-03-18 @ 10 a.m and [name] on 11-06-18 @ 10:40 a.m.  Encouraged support groups.  R:  Pt receptive.

## 2018-10-20 NOTE — Progress Notes (Signed)
Christine [name] is a 43 y.o. , married, unemployed African American female who transitioned from [facility].  Pt states she was in [facility] for three weeks.  As per previous CCA states:  Pt presents as PHP referral from her OP therapist, Binnie Rail. Pt is known to this writer due to past admission in [place] approximately 03/2017 and 09/2017. Pt was inpt due to suicidal ideation with plan last week. Pt states "I didn't get anything from inpt because they were short staffed." Pt reports an increase of depression symptoms preventing her from completing ADL's and creating increased hopelessness. Pt reports current passive SI w/o intent or plan. Pt reports many current stressors including recent death of mother 17-Jun-2023), financial strain, feeling worthless due to "not having a purpose or job", and strained relationships with family since mother's death. Pt has regular therapist and psychiatrist within agency and is happy with services however feels, along with her therapist, that more intensive services are needed. Pt denies active SI/HI/AVH however endorses passive SI. Pt started MH-IOP today.  Affect was labile.  She was tearful off and on with Probation officer, stating that is how her mood is.  Pt is looking forward to home schooling her daughter starting today. Admits to passive SI.  Denies a plan or intent.  Discussed safety options at length with pt.  Pt was able to contract for safety.  Denies HI or A/V hallucinations.   Pt attended four days.  Pt called stating that she wouldn't return to MH-IOP d/t the coronavirus precautions.  Reports overall feeling stable.  States she learned a lot in the groups.  A: Discharge as per pt's request.  F/U with [name] on 11-06-18 @ 10:40 a.m and Lise Auer, LCSW on 11-03-18 @ 10 a.m.  Encouraged support groups.  R:  Pt receptive.          Carlis Abbott, RITA, M.Ed, CNA

## 2018-10-21 ENCOUNTER — Ambulatory Visit (INDEPENDENT_AMBULATORY_CARE_PROVIDER_SITE_OTHER): Payer: Medicare Other | Admitting: Family Medicine

## 2018-10-21 ENCOUNTER — Encounter (INDEPENDENT_AMBULATORY_CARE_PROVIDER_SITE_OTHER): Payer: Self-pay | Admitting: Family Medicine

## 2018-10-21 ENCOUNTER — Other Ambulatory Visit (HOSPITAL_COMMUNITY): Payer: Medicare Other

## 2018-10-21 ENCOUNTER — Encounter (HOSPITAL_COMMUNITY): Payer: Self-pay | Admitting: Family

## 2018-10-21 VITALS — BP 139/76 | HR 88 | Ht 64.0 in | Wt 335.0 lb

## 2018-10-21 DIAGNOSIS — E559 Vitamin D deficiency, unspecified: Secondary | ICD-10-CM

## 2018-10-21 DIAGNOSIS — Z9189 Other specified personal risk factors, not elsewhere classified: Secondary | ICD-10-CM | POA: Diagnosis not present

## 2018-10-21 DIAGNOSIS — I10 Essential (primary) hypertension: Secondary | ICD-10-CM | POA: Diagnosis not present

## 2018-10-21 DIAGNOSIS — R7303 Prediabetes: Secondary | ICD-10-CM

## 2018-10-21 DIAGNOSIS — Z6841 Body Mass Index (BMI) 40.0 and over, adult: Secondary | ICD-10-CM

## 2018-10-21 DIAGNOSIS — E66813 Obesity, class 3: Secondary | ICD-10-CM

## 2018-10-21 MED ORDER — METFORMIN HCL 500 MG PO TABS: 500.0000 mg | ORAL_TABLET | Freq: Every day | ORAL | 0 refills | Status: DC

## 2018-10-21 MED ORDER — VITAMIN D (ERGOCALCIFEROL) 1.25 MG (50000 UNIT) PO CAPS: 50000.0000 [IU] | ORAL_CAPSULE | ORAL | 0 refills | Status: DC
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
## 2018-10-21 NOTE — Progress Notes (Signed)
Office: [phone]  /  Fax: [phone]   HPI:   Chief Complaint: OBESITY Christine [name] is here to discuss her progress with her obesity treatment plan. She is on the Category 3 plan and is following her eating plan approximately 33 % of the time. She states she is exercising 0 minutes 0 times per week. Christine did well with breakfast, but really struggled with meal planning and prepping.  Her weight is (!) 335 lb (152 kg) today and has had a weight loss of 2 pounds over a period of 2 weeks since her last visit. She has lost 2 lbs since starting treatment with [place].  Hypertension Christine [name]'s is a 43 y.o. female with hypertension. Christine's blood pressure is currently stable on medications. She is working on her diet and weight loss to help control her blood pressure with the goal of decreasing her risk of heart attack and stroke. Christine denies chest pain or dizziness.  Vitamin D Deficiency Christine [name] has a diagnosis of vitamin D deficiency. She is not currently on vit D and her levels are very low at 7.0 on 10/07/18. Christine denies nausea, vomiting, or muscle weakness.  Pre-Diabetes Christine [name] has a diagnosis of pre-diabetes based on her elevated Hgb A1c and was informed this puts her at greater risk of developing diabetes. She is not taking metformin currently and continues to work on diet and exercise to decrease risk of diabetes.   At risk for diabetes Christine [name] is at higher than average risk for developing diabetes due to her pre-diabetes and obesity. She currently denies polyuria or polydipsia.  ASSESSMENT AND PLAN:  Essential hypertension  Vitamin D deficiency - Plan: Vitamin D, Ergocalciferol, (DRISDOL) 1.25 MG (50000 UT) CAPS capsule  Prediabetes - Plan: metFORMIN (GLUCOPHAGE) 500 MG tablet  At risk for diabetes mellitus  Class 3 severe obesity with serious comorbidity and body mass index (BMI) of 50.0 to 59.9 in adult, unspecified obesity type ([place])  PLAN:  Hypertension We discussed  sodium restriction, working on healthy weight loss, and a regular exercise program as the means to achieve improved blood pressure control. We will continue to monitor her blood pressure as well as her progress with the above lifestyle modifications. She will continue her amlodipine and diet. She will watch for signs of hypotension as she continues her lifestyle modifications. We will recheck her blood pressure in 2 weeks. Christine agreed with this plan and agreed to follow up as directed in 2 weeks.  Vitamin D Deficiency Christine was informed that low vitamin D levels contribute to fatigue and are associated with obesity, breast, and colon cancer. Christine agrees to start to take prescription Vit D @50 ,000 IU every week #4 with no refills and will follow up for routine testing of vitamin D, at least 2-3 times per year. She was informed of the risk of over-replacement of vitamin D and agrees to not increase her dose unless she discusses this with [place] first. We will recheck labs in 3 months. Christine agrees to follow up in 2 weeks as directed.  Pre-Diabetes Christine [name] will continue to work on weight loss, exercise, and decreasing simple carbohydrates in her diet to help decrease the risk of diabetes. She was informed that eating too many simple carbohydrates or too many calories at one sitting increases the likelihood of GI side effects. Christine agreed to start metformin 500 mg qAM #30 with no refills and a prescription was written today. Christine agreed to follow up with [place] as directed to monitor her progress.  Diabetes risk counseling Christine was given extended (30 minutes) diabetes prevention counseling today. She is 43 y.o. female and has risk factors for diabetes including pre-diabetes and obesity. We discussed intensive lifestyle modifications today with an emphasis on weight loss as well as increasing exercise and decreasing simple carbohydrates in her diet.  Obesity Christine [name] is currently in the action stage of  change. As such, her goal is to continue with weight loss efforts. She has agreed to follow the Category 3 plan with additional breakfast options. Christine [name] has been instructed to work up to a goal of 150 minutes of combined cardio and strengthening exercise per week for weight loss and overall health benefits. We discussed the following Behavioral Modification Strategies today: increasing lean protein intake, increasing vegetables, decreasing sodium intake, increase H2O intake, and work on meal planning and easy cooking plans.  Christine [name] has agreed to follow up with our clinic in 2 weeks. She was informed of the importance of frequent follow up visits to maximize her success with intensive lifestyle modifications for her multiple health conditions.  ALLERGIES: Allergies  Allergen Reactions  . Penicillins Rash and Other (See Comments)    Has patient had a PCN reaction causing immediate rash, facial/tongue/throat swelling, SOB or lightheadedness with hypotension: Yes Has patient had a PCN reaction causing severe rash involving mucus membranes or skin necrosis: Unknown Has patient had a PCN reaction that required hospitalization: No Has patient had a PCN reaction occurring within the last 10 years: No If all of the above answers are "NO", then may proceed with Cephalosporin use.     MEDICATIONS: Current Outpatient Medications on File Prior to Visit  Medication Sig Dispense Refill  . amLODipine (NORVASC) 10 MG tablet Take 1 tablet (10 mg total) by mouth daily. 30 tablet 0  . ARIPiprazole (ABILIFY) 15 MG tablet Take 1 tablet (15 mg total) by mouth daily. 30 tablet 0  . clonazePAM (KLONOPIN) 0.5 MG tablet Take 0.5 mg by mouth 2 (two) times daily as needed for anxiety.    [name] (AIMOVIG) 140 MG/ML SOAJ Inject 140 mg into the skin every 30 (thirty) days. 1 pen 11  . lamoTRIgine (LAMICTAL) 100 MG tablet Take 1 tablet (100 mg total) by mouth daily. 30 tablet 2  . lamoTRIgine (LAMICTAL) 150 MG  tablet Take 1 tablet (150 mg total) by mouth 2 (two) times daily. For mood 60 tablet 1  . lithium carbonate 300 MG capsule Take 2 capsules (600 mg total) by mouth daily AND 3 capsules (900 mg total) at bedtime. Do all this for 30 days. 120 capsule 0  . metoprolol succinate (TOPROL-XL) 50 MG 24 hr tablet Take 50 mg by mouth daily. Take with or immediately following a meal.     No current facility-administered medications on file prior to visit.     PAST MEDICAL HISTORY: Past Medical History:  Diagnosis Date  . Anemia   . Anxiety   . Arthritis    Hands, ankles  . Back pain   . Bipolar 1 disorder ([place])   . Chest pain   . Depression   . Depression   . Dyspnea   . Fibroids   . Fibromyalgia   . Headache   . High blood pressure   . History of blood transfusion 08/2017   [facility]  . History of hiatal hernia   . Infertility, female   . Joint pain   . Lactose intolerance   . Lower extremity edema   . Mental disorder   .  Migraines   . Osteoarthritis   . PCOS (polycystic ovarian syndrome)   . Seasonal allergies   . Vitamin D deficiency     PAST SURGICAL HISTORY: Past Surgical History:  Procedure Laterality Date  . CERVICAL POLYPECTOMY  01/15/2018   Procedure: CERVICAL POLYPECTOMY;  Surgeon: Arvella Nigh, MD;  Location: [facility];  Service: Gynecology;;  . COLONOSCOPY  2008   Normal  . DIAGNOSTIC LAPAROSCOPY  08/1999   dermoid cyst, [facility]  . DILATION AND CURETTAGE OF UTERUS  05/2004   [facility]  . DILATION AND CURETTAGE OF UTERUS  04/2015  . HYSTEROSCOPY W/D&C  07/22/2011   Procedure: DILATATION AND CURETTAGE /HYSTEROSCOPY;  Surgeon: Cyril Mourning, MD;  Location: [facility];  Service: Gynecology;;  . HYSTEROSCOPY W/D&C N/A 01/15/2018   Procedure: DILATATION AND CURETTAGE [place] WITH MYOSURE;  Surgeon: Arvella Nigh, MD;  Location: [facility];  Service: Gynecology;  Laterality: N/A;  . 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 Smoking  status: Never Smoker  . Smokeless tobacco: Never Used  Substance Use Topics  . Alcohol use: Yes    Comment: occasional, maybe once a month or so  . Drug use: No    FAMILY HISTORY: Family History  Problem Relation Age of Onset  . Anxiety disorder Mother   . Depression Mother   . Cancer Mother        pancreatic   . High blood pressure Mother   . Hypertension Mother   . Hyperlipidemia Mother   . Cancer Father        colon  . High blood pressure Father   . Diabetes Father   . Hypertension Father   . Hyperlipidemia Father   . Heart disease Father   . Stroke Father   . Kidney disease Father   . Sleep apnea Father   . Obesity Father   . Cancer Brother 6       leukemia, childhood  . High blood pressure Brother   . Migraines Neg Hx    ROS: Review of Systems  Constitutional: Positive for malaise/fatigue and weight loss.  Cardiovascular: Negative for chest pain.  Genitourinary:       Negative for polyuria.  Neurological: Negative for dizziness.  Endo/Heme/Allergies: Negative for polydipsia.   PHYSICAL EXAM: Blood pressure 139/76, pulse 88, height 5\' 4"  (1.626 m), weight (!) 335 lb (152 kg), last menstrual period 10/05/2018, SpO2 98 %. Body mass index is 57.5 kg/m. Physical Exam Vitals signs reviewed.  Constitutional:      Appearance: Normal appearance. She is obese.  Cardiovascular:     Rate and Rhythm: Normal rate.  Pulmonary:     Effort: Pulmonary effort is normal.  Musculoskeletal: Normal range of motion.  Skin:    General: Skin is warm and dry.  Neurological:     Mental Status: She is alert and oriented to person, place, and time.  Psychiatric:        Mood and Affect: Mood normal.        Behavior: Behavior normal.    RECENT LABS AND TESTS: BMET    Component Value Date/Time   NA 138 10/07/2018 1219   K 4.4 10/07/2018 1219   CL 104 10/07/2018 1219   CO2 21 10/07/2018 1219   GLUCOSE 78 10/07/2018 1219   GLUCOSE 94 09/07/2018 1718   BUN 10 10/07/2018  1219   CREATININE 0.77 10/07/2018 1219   CREATININE 0.88 03/19/2018 1051  CALCIUM 9.5 10/07/2018 1219   GFRNONAA 96 10/07/2018 1219   GFRNONAA >60 03/19/2018 1051   GFRAA 111 10/07/2018 1219   GFRAA >60 03/19/2018 1051   Lab Results  Component Value Date   HGBA1C 5.7 (H) 10/07/2018   Lab Results  Component Value Date   INSULIN 19.5 10/07/2018   CBC    Component Value Date/Time   WBC 12.0 (H) 09/07/2018 1718   RBC 4.37 09/07/2018 1718   HGB 10.1 (L) 09/07/2018 1718   HGB 12.6 03/19/2018 1051   HCT 36.5 09/07/2018 1718   PLT 404 (H) 09/07/2018 1718   PLT 266 03/19/2018 1051   MCV 83.5 09/07/2018 1718   MCH 23.1 (L) 09/07/2018 1718   MCHC 27.7 (L) 09/07/2018 1718   RDW 17.4 (H) 09/07/2018 1718   LYMPHSABS 1.9 08/08/2018 1312   MONOABS 1.0 08/08/2018 1312   EOSABS 0.1 08/08/2018 1312   BASOSABS 0.0 08/08/2018 1312   Iron/TIBC/Ferritin/ %Sat    Component Value Date/Time   IRON 52 03/19/2018 1051   TIBC 311 03/19/2018 1051   FERRITIN 30 03/19/2018 1051   IRONPCTSAT 17 (L) 03/19/2018 1051   Lipid Panel     Component Value Date/Time   CHOL 183 10/07/2018 1220   TRIG 70 10/07/2018 1220   HDL 38 (L) 10/07/2018 1220   LDLCALC 131 (H) 10/07/2018 1220   Hepatic Function Panel     Component Value Date/Time   PROT 7.0 10/07/2018 1219   ALBUMIN 4.2 10/07/2018 1219   AST 11 10/07/2018 1219   AST 8 (L) 03/19/2018 1051   ALT 13 10/07/2018 1219   ALT 12 03/19/2018 1051   ALKPHOS 65 10/07/2018 1219   BILITOT 0.4 10/07/2018 1219   BILITOT 0.4 03/19/2018 1051      Component Value Date/Time   TSH 1.260 10/07/2018 1219   TSH 1.460 04/28/2018 1130   Results for DANNIELL, ROTUNDO (MRN [id number]) as of 10/21/2018 14:40  Ref. Range 10/07/2018 12:19  Vitamin D, 25-Hydroxy Latest Ref Range: 30.0 - 100.0 ng/mL 7.0 (L)   OBESITY BEHAVIORAL INTERVENTION VISIT  Today's visit was # 2  Starting weight: 337 lbs Starting date: 10/07/18 Today's weight : Weight: (!) 335 lb (152 kg)   Today's date: 10/21/2018 Total lbs lost to date: 2    10/21/2018  Height 5\' 4"  (1.626 m)  Weight 335 lb (152 kg) (A)  BMI (Calculated) 57.47  BLOOD PRESSURE - SYSTOLIC 627  BLOOD PRESSURE - DIASTOLIC 76   Body Fat % 59 %   ASK: We discussed the diagnosis of obesity with Louis Meckel today and Christine agreed to give [place] permission to discuss obesity behavioral modification therapy today.  ASSESS: Christine [name] has the diagnosis of obesity and her BMI today is 57.47. Christine [name] is in the action stage of change.   ADVISE: Christine [name] was educated on the multiple health risks of obesity as well as the benefit of weight loss to improve her health. She was advised of the need for long term treatment and the importance of lifestyle modifications to improve her current health and to decrease her risk of future health problems.  AGREE: Multiple dietary modification options and treatment options were discussed and Christine agreed to follow the recommendations documented in the above note.  ARRANGE: Christine [name] was educated on the importance of frequent visits to treat obesity as outlined per CMS and USPSTF guidelines and agreed to schedule her next follow up appointment today.  IMarcille Blanco, CMA, am acting as Location manager for Starlyn Skeans,  MD  I have reviewed the above documentation for accuracy and completeness, and I agree with the above. -Dennard Nip, MD

## 2018-10-21 NOTE — Psych (Signed)
   [facility] PHP THERAPIST PROGRESS NOTE  Christine [name] [id number]  Session Time: 9:00 - 11:00  Participation Level: Active  Behavioral Response: CasualAlertAnxiousDepressed  Type of Therapy: Group Therapy; Psychotherapy  Treatment Goals addressed: Coping  Interventions: CBT, DBT, Solution Focused, Supportive and Reframing  Summary: Clinician led check-in regarding current stressors and situation, and review of patient completed daily inventory. Clinician utilized active listening and empathetic response and validated patient emotions. Clinician facilitated processing group on pertinent issues.  Therapist Response: Patient arrived within time allowed and reports that she is feeling good but has an "up and down weekend". Patient rates her mood at a 5 on a scale of 1-10 with 10 being great. Pt. reports that she "had a lot more going on than she expected", over the weekend. Pt. states she was frustrated because she found out that she would not be able to afford her medication. Pt. states that daughters decided to have a last-minute slumber party and it stressed her out. Pt. states that on Sunday she "decompressed and took some time for myself", and used self-care skills and distraction skills to help her manage stress. Patient continues to struggle with communicating her needs. Patient engaged in discussion.     Session Time: 11:00 -12:00   Participation Level: Active   Behavioral Response: CasualAlertDepressed   Type of Therapy: Group Therapy, OT   Treatment Goals addressed: Coping   Interventions: Psychosocial skills training, Supportive,    Summary:  Occupational Therapy group   Therapist Response: Patient engaged in group. See OT note.            Session Time: 12:00 - 12:45  Participation Level: Active  Behavioral Response: CasualAlertDepressed  Type of Therapy: Group Therapy, Activity Therapy  Treatment Goals addressed: Coping  Interventions: Air traffic controller, Supportive  Summary:  Reflection Group: Patients encouraged to practice skills and interpersonal techniques or work on mindfulness and relaxation techniques. The importance of self-care and making skills part of a routine to increase usage were stressed   Therapist Response: Patient engaged and participated appropriately.         Session Time: 12:45 - 2:00  Participation Level: Active  Behavioral Response: CasualAlertAnxiousDepressed  Type of Therapy: Group Therapy; Psychotherapy; Psychoeducation  Treatment Goals addressed: Coping  Interventions: CBT, DBT, Solution Focused, Supportive and Reframing  Summary: 12:45 - 1:50: Cln introduced topic of boundaries. Cln provided psychoeducation on what boundaries are, porous, rigid and healthy boundary characteristics, and the different types of boundaries. Group related the information to themselves to begin discovering potential boundary issues.  1:50 -2:00 Clinician led check-out. Clinician assessed for immediate needs, medication compliance and efficacy, and safety concerns   Therapist Response: Patient engaged in group. Pt reports understanding of boundaries and shares she has mainly porous boundaries.  At [place], patient rates her mood at a 7 on a scale of 1-10 with 10 being great. Patient reports afternoon plans to run errands, cook dinner and research [employer]. Patient demonstrates some progress as evidenced by identifying and managing stressors. Patient denies SI/HI/self-harm at the end of group.        Suicidal/Homicidal: Nowithout intent/plan  Plan: Patient will continue in PHP while working on decreasing depression and anxiety symptoms and SI, and increasing ability to manage symptoms in a healthy manner.   Diagnosis: Bipolar 1 disorder, depressed, severe ([place]) [F31.4]    1. Bipolar 1 disorder, depressed, severe ([place])   2. Generalized anxiety disorder     [name], LCSW 10/21/2018

## 2018-10-22 ENCOUNTER — Other Ambulatory Visit (HOSPITAL_COMMUNITY): Payer: Medicare Other

## 2018-10-23 ENCOUNTER — Other Ambulatory Visit (HOSPITAL_COMMUNITY): Payer: Medicare Other

## 2018-10-26 ENCOUNTER — Other Ambulatory Visit (HOSPITAL_COMMUNITY): Payer: Medicare Other

## 2018-10-27 ENCOUNTER — Encounter

## 2018-10-27 ENCOUNTER — Other Ambulatory Visit (HOSPITAL_COMMUNITY): Payer: Medicare Other

## 2018-10-27 ENCOUNTER — Ambulatory Visit (HOSPITAL_COMMUNITY): Payer: BC Managed Care – PPO | Admitting: Psychiatry

## 2018-10-27 ENCOUNTER — Encounter (INDEPENDENT_AMBULATORY_CARE_PROVIDER_SITE_OTHER): Payer: Self-pay

## 2018-10-28 ENCOUNTER — Other Ambulatory Visit (HOSPITAL_COMMUNITY): Payer: Medicare Other

## 2018-10-29 ENCOUNTER — Other Ambulatory Visit (HOSPITAL_COMMUNITY): Payer: Medicare Other

## 2018-10-30 ENCOUNTER — Other Ambulatory Visit (HOSPITAL_COMMUNITY): Payer: Medicare Other

## 2018-11-02 ENCOUNTER — Other Ambulatory Visit (HOSPITAL_COMMUNITY): Payer: Medicare Other

## 2018-11-03 ENCOUNTER — Other Ambulatory Visit: Payer: Self-pay

## 2018-11-03 ENCOUNTER — Ambulatory Visit (INDEPENDENT_AMBULATORY_CARE_PROVIDER_SITE_OTHER): Payer: Medicare Other | Admitting: Psychiatry

## 2018-11-03 ENCOUNTER — Encounter (HOSPITAL_COMMUNITY): Payer: Self-pay | Admitting: Psychiatry

## 2018-11-03 DIAGNOSIS — R4589 Other symptoms and signs involving emotional state: Secondary | ICD-10-CM | POA: Diagnosis not present

## 2018-11-03 DIAGNOSIS — F3162 Bipolar disorder, current episode mixed, moderate: Secondary | ICD-10-CM

## 2018-11-03 NOTE — Progress Notes (Signed)
Virtual Visit via Video Note  I connected with Christine [name] on 11/03/18 at 10:00 AM EDT by a video enabled telemedicine application and verified that I am speaking with the correct person using two identifiers.   I discussed the limitations of evaluation and management by telemedicine and the availability of in person appointments. The patient expressed understanding and agreed to proceed.  History of Present Illness: Bipolar 1 disorder, mixed, moderate and difficulty coping due to hormonal influences, grief and loss of mother and grief and loss associated with mental illness.    Observations/Objective: Counselor established therapeutic relationship with Christine [name]. Counselor processed treatment history and takeaways for Christine [name], as she is being stepped down from higher levels of care. Counselor explored her support system, life stressors and history of mental health. Counselor and Christine [name] identified health coping skills and strategies to implement to address current stressors. Christine [name] identified goals she would like to address in treatment. Counselor challenged Christine [name] to implement self-care regimen.   Assessment and Plan: Counselor assessed that Christine [name] is not suicidal or experiencing extreme depression at this time. She is experiencing increased anxiety due to COVD-19 restrictions. We will contine to meet via Webex, EOW.   Follow Up Instructions: Continue with coping skill and stregety implementation. Take medications as prescribed.    I discussed the assessment and treatment plan with the patient. The patient was provided an opportunity to ask questions and all were answered. The patient agreed with the plan and demonstrated an understanding of the instructions.   The patient was advised to call back or seek an in-person evaluation if the symptoms worsen or if the condition fails to improve as anticipated.  I provided 55 minutes of non-face-to-face time during this encounter.   Lise Auer,  LCSW

## 2018-11-04 ENCOUNTER — Other Ambulatory Visit: Payer: Self-pay

## 2018-11-04 ENCOUNTER — Other Ambulatory Visit (HOSPITAL_COMMUNITY): Payer: Medicare Other

## 2018-11-04 ENCOUNTER — Ambulatory Visit (INDEPENDENT_AMBULATORY_CARE_PROVIDER_SITE_OTHER): Payer: Medicare Other | Admitting: Family Medicine

## 2018-11-04 ENCOUNTER — Encounter (INDEPENDENT_AMBULATORY_CARE_PROVIDER_SITE_OTHER): Payer: Self-pay | Admitting: Family Medicine

## 2018-11-04 DIAGNOSIS — R7303 Prediabetes: Secondary | ICD-10-CM | POA: Diagnosis not present

## 2018-11-04 DIAGNOSIS — Z6841 Body Mass Index (BMI) 40.0 and over, adult: Secondary | ICD-10-CM

## 2018-11-04 DIAGNOSIS — F319 Bipolar disorder, unspecified: Secondary | ICD-10-CM | POA: Diagnosis not present

## 2018-11-04 NOTE — Progress Notes (Signed)
Office: [phone]  /  Fax: [phone] TeleHealth Visit:  Tieisha Darden has consented to this TeleHealth visit today via telephone due to audio technical difficulties. The patient is located at home, the provider is located at the [employer] [facility] office. The participants in this visit include the listed provider and patient.   HPI:   Chief Complaint: OBESITY [name] is here to discuss her progress with her obesity treatment plan. She is on the Category 3 plan and is following her eating plan approximately 50 % of the time. She states she is exercising 0 minutes 0 times per week. [name] is unable to do a video visit so we did a telephone visit. [name] notes increased stress, while struggling at home with her kids during the [place]. She notes increased cravings and stress eating. She feels that she is maintaining her weight and doing better with portion control, than following her plan closely.  We were unable to weigh the patient today for this TeleHealth visit.She feels as if she has maintained weight since her last visit. She has lost 2 lbs since starting treatment with [place].  Bipolar I Disorder [name] notes some increased anxiety during [place]. She is otherwise stable on her medications. She shows no sign of suicidal or homicidal ideations.  Pre-Diabetes [name] has a diagnosis of pre-diabetes based on her elevated Hgb A1c and was informed this puts her at greater risk of developing diabetes. She is stable on metformin currently and she has some questions about how certain foods affect her blood sugar and hunger. [name] notes increased polyphagia when she increased simple carbs. She continues to work on diet and exercise to decrease risk of diabetes.   ASSESSMENT AND PLAN:  Bipolar 1 disorder ([place])  Prediabetes  Class 3 severe obesity with serious comorbidity and body mass index (BMI) of 50.0 to 59.9 in adult, unspecified obesity type ([place])  PLAN:  Bipolar I  Disorder [name] was offered cognitive behavioral therapies to help control emotional eating. [name] agreed and she feels more in control and will use the strategies that we discussed. She will follow up in 2 weeks.  Pre-Diabetes [name] will continue to work on weight loss, exercise, and decreasing simple carbohydrates in her diet to help decrease the risk of diabetes. She was informed that eating too many simple carbohydrates or too many calories at one sitting increases the likelihood of GI side effects. [name] was educated on nutrition and I encouraged her to get back to her Category 3 plan as closely as possible. [name] agreed to follow up with [place] as directed to monitor her progress in 2 weeks.   I spent > than 50% of the 25 minute visit on counseling as documented in the note.  Obesity [name] is currently in the action stage of change. As such, her goal is to continue with weight loss efforts. She has agreed to follow the Category 3 plan. [name] has been instructed to work up to a goal of 150 minutes of combined cardio and strengthening exercise per week for weight loss and overall health benefits. We discussed the following Behavioral Modification Strategies today: increasing lean protein intake, decreasing simple carbohydrates, increasing vegetables, emotional eating strategies, ways to avoid boredom eating, better snacking choices, and ways to avoid night time snacking.  [name] has agreed to follow up with our clinic in 2 weeks. She was informed of the importance of frequent follow up visits to maximize her success with intensive lifestyle modifications for her multiple health  conditions.  ALLERGIES: Allergies  Allergen Reactions  . Penicillins Rash and Other (See Comments)    Has patient had a PCN reaction causing immediate rash, facial/tongue/throat swelling, SOB or lightheadedness with hypotension: Yes Has patient had a PCN reaction causing severe rash involving mucus membranes or  skin necrosis: Unknown Has patient had a PCN reaction that required hospitalization: No Has patient had a PCN reaction occurring within the last 10 years: No If all of the above answers are "NO", then may proceed with Cephalosporin use.     MEDICATIONS: Current Outpatient Medications on File Prior to Visit  Medication Sig Dispense Refill  . amLODipine (NORVASC) 10 MG tablet Take 1 tablet (10 mg total) by mouth daily. 30 tablet 0  . ARIPiprazole (ABILIFY) 15 MG tablet Take 1 tablet (15 mg total) by mouth daily. 30 tablet 0  . clonazePAM (KLONOPIN) 0.5 MG tablet Take 0.5 mg by mouth 2 (two) times daily as needed for anxiety.    [name] (AIMOVIG) 140 MG/ML SOAJ Inject 140 mg into the skin every 30 (thirty) days. 1 pen 11  . lamoTRIgine (LAMICTAL) 100 MG tablet Take 1 tablet (100 mg total) by mouth daily. 30 tablet 2  . lamoTRIgine (LAMICTAL) 150 MG tablet Take 1 tablet (150 mg total) by mouth 2 (two) times daily. For mood 60 tablet 1  . lithium carbonate 300 MG capsule Take 2 capsules (600 mg total) by mouth daily AND 3 capsules (900 mg total) at bedtime. Do all this for 30 days. 120 capsule 0  . metFORMIN (GLUCOPHAGE) 500 MG tablet Take 1 tablet (500 mg total) by mouth daily with breakfast. 30 tablet 0  . metoprolol succinate (TOPROL-XL) 50 MG 24 hr tablet Take 50 mg by mouth daily. Take with or immediately following a meal.    . Vitamin D, Ergocalciferol, (DRISDOL) 1.25 MG (50000 UT) CAPS capsule Take 1 capsule (50,000 Units total) by mouth every 7 (seven) days. 4 capsule 0   No current facility-administered medications on file prior to visit.     PAST MEDICAL HISTORY: Past Medical History:  Diagnosis Date  . Anemia   . Anxiety   . Arthritis    Hands, ankles  . Back pain   . Bipolar 1 disorder ([place])   . Chest pain   . Depression   . Depression   . Dyspnea   . Fibroids   . Fibromyalgia   . Headache   . High blood pressure   . History of blood transfusion 08/2017   [facility]   . History of hiatal hernia   . Infertility, female   . Joint pain   . Lactose intolerance   . Lower extremity edema   . Mental disorder   . Migraines   . Osteoarthritis   . PCOS (polycystic ovarian syndrome)   . Seasonal allergies   . Vitamin D deficiency     PAST SURGICAL HISTORY: Past Surgical History:  Procedure Laterality Date  . CERVICAL POLYPECTOMY  01/15/2018   Procedure: CERVICAL POLYPECTOMY;  Surgeon: Arvella Nigh, MD;  Location: [facility];  Service: Gynecology;;  . COLONOSCOPY  2008   Normal  . DIAGNOSTIC LAPAROSCOPY  08/1999   dermoid cyst, [facility]  . DILATION AND CURETTAGE OF UTERUS  05/2004   [facility]  . DILATION AND CURETTAGE OF UTERUS  04/2015  . HYSTEROSCOPY W/D&C  07/22/2011   Procedure: DILATATION AND CURETTAGE /HYSTEROSCOPY;  Surgeon: Cyril Mourning, MD;  Location: [facility];  Service: Gynecology;;  . HYSTEROSCOPY W/D&C N/A  01/15/2018   Procedure: DILATATION AND CURETTAGE /HYSTEROSCOPY WITH MYOSURE;  Surgeon: Arvella Nigh, MD;  Location: [facility];  Service: Gynecology;  Laterality: N/A;  . 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 Smoking status: Never Smoker  . Smokeless tobacco: Never Used  Substance Use Topics  . Alcohol use: Yes    Comment: occasional, maybe once a month or so  . Drug use: No    FAMILY HISTORY: Family History  Problem Relation Age of Onset  . Anxiety disorder Mother   . Depression Mother   . Cancer Mother        pancreatic   . High blood pressure Mother   . Hypertension Mother   . Hyperlipidemia Mother   . Cancer Father        colon  . High blood pressure Father   . Diabetes Father   . Hypertension Father   . Hyperlipidemia Father   . Heart disease Father   . Stroke Father   . Kidney disease Father   . Sleep apnea Father   . Obesity Father   . Cancer Brother 6       leukemia, childhood  . High blood pressure Brother   . Migraines Neg Hx     ROS: Review of Systems   Endo/Heme/Allergies:       Positive for polyphagia.  Psychiatric/Behavioral: Negative for suicidal ideas. The patient is nervous/anxious.        Negative for homicidal ideations.    PHYSICAL EXAM: Pt in no acute distress  RECENT LABS AND TESTS: BMET    Component Value Date/Time   NA 138 10/07/2018 1219   K 4.4 10/07/2018 1219   CL 104 10/07/2018 1219   CO2 21 10/07/2018 1219   GLUCOSE 78 10/07/2018 1219   GLUCOSE 94 09/07/2018 1718   BUN 10 10/07/2018 1219   CREATININE 0.77 10/07/2018 1219   CREATININE 0.88 03/19/2018 1051   CALCIUM 9.5 10/07/2018 1219   GFRNONAA 96 10/07/2018 1219   GFRNONAA >60 03/19/2018 1051   GFRAA 111 10/07/2018 1219   GFRAA >60 03/19/2018 1051   Lab Results  Component Value Date   HGBA1C 5.7 (H) 10/07/2018   Lab Results  Component Value Date   INSULIN 19.5 10/07/2018   CBC    Component Value Date/Time   WBC 12.0 (H) 09/07/2018 1718   RBC 4.37 09/07/2018 1718   HGB 10.1 (L) 09/07/2018 1718   HGB 12.6 03/19/2018 1051   HCT 36.5 09/07/2018 1718   PLT 404 (H) 09/07/2018 1718   PLT 266 03/19/2018 1051   MCV 83.5 09/07/2018 1718   MCH 23.1 (L) 09/07/2018 1718   MCHC 27.7 (L) 09/07/2018 1718   RDW 17.4 (H) 09/07/2018 1718   LYMPHSABS 1.9 08/08/2018 1312   MONOABS 1.0 08/08/2018 1312   EOSABS 0.1 08/08/2018 1312   BASOSABS 0.0 08/08/2018 1312   Iron/TIBC/Ferritin/ %Sat    Component Value Date/Time   IRON 52 03/19/2018 1051   TIBC 311 03/19/2018 1051   FERRITIN 30 03/19/2018 1051   IRONPCTSAT 17 (L) 03/19/2018 1051   Lipid Panel     Component Value Date/Time   CHOL 183 10/07/2018 1220   TRIG 70 10/07/2018 1220   HDL 38 (L) 10/07/2018 1220   LDLCALC 131 (H) 10/07/2018 1220   Hepatic Function Panel     Component Value Date/Time   PROT 7.0 10/07/2018 1219   ALBUMIN 4.2 10/07/2018 1219  AST 11 10/07/2018 1219   AST 8 (L) 03/19/2018 1051   ALT 13 10/07/2018 1219   ALT 12 03/19/2018 1051   ALKPHOS 65 10/07/2018 1219   BILITOT  0.4 10/07/2018 1219   BILITOT 0.4 03/19/2018 1051      Component Value Date/Time   TSH 1.260 10/07/2018 1219   TSH 1.460 04/28/2018 1130   Results for ARRIANA, LOHMANN (MRN [id number]) as of 11/04/2018 11:21  Ref. Range 10/07/2018 12:19  Vitamin D, 25-Hydroxy Latest Ref Range: 30.0 - 100.0 ng/mL 7.0 (L)    I, Marcille Blanco, CMA, am acting as transcriptionist for Starlyn Skeans, MD I have reviewed the above documentation for accuracy and completeness, and I agree with the above. -Dennard Nip, MD

## 2018-11-05 ENCOUNTER — Other Ambulatory Visit (HOSPITAL_COMMUNITY): Payer: Medicare Other

## 2018-11-06 ENCOUNTER — Ambulatory Visit (INDEPENDENT_AMBULATORY_CARE_PROVIDER_SITE_OTHER): Payer: Medicare Other | Admitting: Psychiatry

## 2018-11-06 ENCOUNTER — Other Ambulatory Visit (HOSPITAL_COMMUNITY): Payer: Medicare Other

## 2018-11-06 ENCOUNTER — Other Ambulatory Visit: Payer: Self-pay

## 2018-11-06 DIAGNOSIS — F411 Generalized anxiety disorder: Secondary | ICD-10-CM | POA: Diagnosis not present

## 2018-11-06 DIAGNOSIS — F3162 Bipolar disorder, current episode mixed, moderate: Secondary | ICD-10-CM | POA: Diagnosis not present

## 2018-11-06 MED ORDER — LITHIUM CARBONATE 300 MG PO CAPS: ORAL_CAPSULE | ORAL | 1 refills | Status: DC

## 2018-11-06 MED ORDER — ARIPIPRAZOLE 15 MG PO TABS: 15.0000 mg | ORAL_TABLET | Freq: Every day | ORAL | 0 refills | Status: DC

## 2018-11-06 MED ORDER — LAMOTRIGINE 150 MG PO TABS: 150.0000 mg | ORAL_TABLET | Freq: Two times a day (BID) | ORAL | 1 refills | Status: DC

## 2018-11-06 NOTE — Progress Notes (Signed)
Virtual Visit via Telephone Note  I connected with Christine [name] on 11/06/18 at 10:40 AM EDT by telephone and verified that I am speaking with the correct person using two identifiers.   I discussed the limitations, risks, security and privacy concerns of performing an evaluation and management service by telephone and the availability of in person appointments. I also discussed with the patient that there may be a patient responsible charge related to this service. The patient expressed understanding and agreed to proceed.   History of Present Illness: Patient was evaluated by phone session.  She is doing better since did IOP.  She was earlier admitted to behavioral health center and then did 3 weeks PHP in 1 week IOP.  She like to do more IOP but due to school close due to pandemic coronavirus she was unable to come.  Overall she describes her mood is good.  Patient reported she was unable to take Abilify in December due to unable to afford it and started to notice slowly and gradually depressed and having suicidal thoughts.  She had appointment in January but she was sick and could not come.  In the hospital [name] was discontinued and she was given again Abilify and continue lithium.  In the PHP Abilify dose increase in lithium dose increase.  She is tolerating these medication very well.  She has no tremors shakes or any EPS.  She is taking Klonopin only as needed.  She is also pleased that her disability is approved.  She is sleeping good.  She denies any mania, psychosis, hallucination.  Her mood swings are not as intense and is strong.  She feels the current medicine is working.  She lives with her husband and kids.  Her husband is very supportive.  Patient denies drinking or using any illegal substances.   Past Psychiatric History: Reviewed. Patienthad a history of depression since age 29. She took antidepressant that made her manic. She saw Dr. [name] and tried tried numerous  medication which includes Risperdal, Seroquel, Abilify, lithium, Prozac, Paxil, Zoloft, Lexapro, Celexa, Effexor and Klonopin.She had history of mania and impulsive behavior. We have tried [name]. She has 3 psychiatric hospitalization.[facility] and intensive outpatient program.Denies any history of suicidal attempt. Her last hospitalization was in November 2017.   Recent Results (from the past 2160 hour(s))  Comprehensive metabolic panel     Status: Abnormal   Collection Time: 08/08/18  1:12 PM  Result Value Ref Range   Sodium 138 135 - 145 mmol/L   Potassium 3.8 3.5 - 5.1 mmol/L   Chloride 109 98 - 111 mmol/L   CO2 21 (L) 22 - 32 mmol/L   Glucose, Bld 85 70 - 99 mg/dL   BUN 8 6 - 20 mg/dL   Creatinine, Ser 0.83 0.44 - 1.00 mg/dL   Calcium 9.0 8.9 - 10.3 mg/dL   Total Protein 7.0 6.5 - 8.1 g/dL   Albumin 3.8 3.5 - 5.0 g/dL   AST 11 (L) 15 - 41 U/L   ALT 17 0 - 44 U/L   Alkaline Phosphatase 58 38 - 126 U/L   Total Bilirubin 0.4 0.3 - 1.2 mg/dL   GFR calc non Af Amer >60 >60 mL/min   GFR calc Af Amer >60 >60 mL/min   Anion gap 8 5 - 15    Comment: Performed at [facility], [place] [address] [place], [place] [address]  CBC with Differential     Status: Abnormal   Collection Time:  08/08/18  1:12 PM  Result Value Ref Range   WBC 12.3 (H) 4.0 - 10.5 K/uL   RBC 3.77 (L) 3.87 - 5.11 MIL/uL   Hemoglobin 9.2 (L) 12.0 - 15.0 g/dL   HCT 32.3 (L) 36.0 - 46.0 %   MCV 85.7 80.0 - 100.0 fL   MCH 24.4 (L) 26.0 - 34.0 pg   MCHC 28.5 (L) 30.0 - 36.0 g/dL   RDW 16.2 (H) 11.5 - 15.5 %   Platelets 388 150 - 400 K/uL   nRBC 0.2 0.0 - 0.2 %   Neutrophils Relative % 73 %   Neutro Abs 9.0 (H) 1.7 - 7.7 K/uL   Lymphocytes Relative 16 %   Lymphs Abs 1.9 0.7 - 4.0 K/uL   Monocytes Relative 8 %   Monocytes Absolute 1.0 0.1 - 1.0 K/uL   Eosinophils Relative 1 %   Eosinophils Absolute 0.1 0.0 - 0.5 K/uL   Basophils Relative 0 %   Basophils Absolute 0.0 0.0 - 0.1 K/uL    Immature Granulocytes 2 %   Abs Immature Granulocytes 0.23 (H) 0.00 - 0.07 K/uL    Comment: Performed at [facility], [place] [address] [place], [place] [address]  Lipase, blood     Status: None   Collection Time: 08/08/18  1:12 PM  Result Value Ref Range   Lipase 25 11 - 51 U/L    Comment: Performed at [facility], [place] [address], [place], [place] [address]  Urinalysis, Routine w reflex microscopic     Status: None   Collection Time: 08/08/18  1:12 PM  Result Value Ref Range   Color, Urine YELLOW YELLOW   APPearance CLEAR CLEAR   Specific Gravity, Urine 1.011 1.005 - 1.030   pH 7.0 5.0 - 8.0   Glucose, UA NEGATIVE NEGATIVE mg/dL   Hgb urine dipstick NEGATIVE NEGATIVE   Bilirubin Urine NEGATIVE NEGATIVE   Ketones, ur NEGATIVE NEGATIVE mg/dL   Protein, ur NEGATIVE NEGATIVE mg/dL   Nitrite NEGATIVE NEGATIVE   Leukocytes, UA NEGATIVE NEGATIVE    Comment: Performed at [place] [address] [place], [place] [address]  I-Stat beta hCG blood, ED     Status: None   Collection Time: 08/08/18  2:25 PM  Result Value Ref Range   I-stat hCG, quantitative <5.0 <5 mIU/mL   Comment 3            Comment:   GEST. AGE      CONC.  (mIU/mL)   <=1 WEEK        5 - 50     2 WEEKS       50 - 500     3 WEEKS       100 - 10,000     4 WEEKS     1,000 - 30,000        FEMALE AND NON-PREGNANT FEMALE:     LESS THAN 5 mIU/mL   Basic metabolic panel     Status: Abnormal   Collection Time: 08/19/18  9:44 AM  Result Value Ref Range   Sodium 140 135 - 145 mmol/L   Potassium 4.1 3.5 - 5.1 mmol/L   Chloride 110 98 - 111 mmol/L   CO2 21 (L) 22 - 32 mmol/L   Glucose, Bld 93 70 - 99 mg/dL   BUN 8 6 - 20 mg/dL   Creatinine, Ser 0.85 0.44 - 1.00 mg/dL   Calcium 9.4 8.9 - 10.3 mg/dL   GFR  calc non Af Amer >60 >60 mL/min   GFR calc Af Amer >60 >60 mL/min   Anion gap 9 5 - 15    Comment: Performed at [place] [address]  [place], [place] [address]  CBC     Status: Abnormal   Collection Time: 08/19/18  9:44 AM  Result Value Ref Range   WBC 9.1 4.0 - 10.5 K/uL   RBC 4.21 3.87 - 5.11 MIL/uL   Hemoglobin 9.8 (L) 12.0 - 15.0 g/dL   HCT 35.5 (L) 36.0 - 46.0 %   MCV 84.3 80.0 - 100.0 fL   MCH 23.3 (L) 26.0 - 34.0 pg   MCHC 27.6 (L) 30.0 - 36.0 g/dL   RDW 16.7 (H) 11.5 - 15.5 %   Platelets 340 150 - 400 K/uL   nRBC 0.0 0.0 - 0.2 %    Comment: Performed at [facility], [place] [address], [place], [place] [address]  Rapid urine drug screen (hospital performed)     Status: None   Collection Time: 09/07/18  5:10 PM  Result Value Ref Range   Opiates NONE DETECTED NONE DETECTED   Cocaine NONE DETECTED NONE DETECTED   Benzodiazepines NONE DETECTED NONE DETECTED   Amphetamines NONE DETECTED NONE DETECTED   Tetrahydrocannabinol NONE DETECTED NONE DETECTED   Barbiturates NONE DETECTED NONE DETECTED    Comment: (NOTE) DRUG SCREEN FOR MEDICAL PURPOSES ONLY.  IF CONFIRMATION IS NEEDED FOR ANY PURPOSE, NOTIFY LAB WITHIN 5 DAYS. LOWEST DETECTABLE LIMITS FOR URINE DRUG SCREEN Drug Class                     Cutoff (ng/mL) Amphetamine and metabolites    1000 Barbiturate and metabolites    200 Benzodiazepine                 295 Tricyclics and metabolites     300 Opiates and metabolites        300 Cocaine and metabolites        300 THC                            50 Performed at [name], [place] [address], [place], [place] [address]   Comprehensive metabolic panel     Status: Abnormal   Collection Time: 09/07/18  5:18 PM  Result Value Ref Range   Sodium 137 135 - 145 mmol/L   Potassium 3.5 3.5 - 5.1 mmol/L   Chloride 109 98 - 111 mmol/L   CO2 19 (L) 22 - 32 mmol/L   Glucose, Bld 94 70 - 99 mg/dL   BUN 10 6 - 20 mg/dL   Creatinine, Ser 0.97 0.44 - 1.00 mg/dL   Calcium 9.9 8.9 - 10.3 mg/dL   Total Protein 8.3 (H) 6.5 - 8.1 g/dL   Albumin 4.4 3.5 - 5.0 g/dL   AST 14 (L) 15 - 41 U/L   ALT 15  0 - 44 U/L   Alkaline Phosphatase 57 38 - 126 U/L   Total Bilirubin 0.1 (L) 0.3 - 1.2 mg/dL   GFR calc non Af Amer >60 >60 mL/min   GFR calc Af Amer >60 >60 mL/min   Anion gap 9 5 - 15    Comment: Performed at [facility], [place] [address], [place], [place] [address]  Ethanol     Status: None   Collection Time: 09/07/18  5:18 PM  Result  Value Ref Range   Alcohol, Ethyl (B) <10 <10 mg/dL    Comment: (NOTE) Lowest detectable limit for serum alcohol is 10 mg/dL. For medical purposes only. Performed at [facility], [place] [address], [place], [place] [address]   Salicylate level     Status: None   Collection Time: 09/07/18  5:18 PM  Result Value Ref Range   Salicylate Lvl <9.8 2.8 - 30.0 mg/dL    Comment: Performed at [facility], [place] [address] [place], [place] [address]  Acetaminophen level     Status: Abnormal   Collection Time: 09/07/18  5:18 PM  Result Value Ref Range   Acetaminophen (Tylenol), Serum <10 (L) 10 - 30 ug/mL    Comment: (NOTE) Therapeutic concentrations vary significantly. A range of 10-30 ug/mL  may be an effective concentration for many patients. However, some  are best treated at concentrations outside of this range. Acetaminophen concentrations >150 ug/mL at 4 hours after ingestion  and >50 ug/mL at 12 hours after ingestion are often associated with  toxic reactions. Performed at [facility], [place] [address], [place], [place] [address]   cbc     Status: Abnormal   Collection Time: 09/07/18  5:18 PM  Result Value Ref Range   WBC 12.0 (H) 4.0 - 10.5 K/uL   RBC 4.37 3.87 - 5.11 MIL/uL   Hemoglobin 10.1 (L) 12.0 - 15.0 g/dL   HCT 36.5 36.0 - 46.0 %   MCV 83.5 80.0 - 100.0 fL   MCH 23.1 (L) 26.0 - 34.0 pg   MCHC 27.7 (L) 30.0 - 36.0 g/dL   RDW 17.4 (H) 11.5 - 15.5 %   Platelets 404 (H) 150 - 400 K/uL   nRBC 0.2 0.0 - 0.2 %    Comment: Performed at [facility],  [place] [address], [place], [place] [address]  Lithium level     Status: None   Collection Time: 09/08/18  6:31 PM  Result Value Ref Range   Lithium Lvl 0.85 0.60 - 1.20 mmol/L    Comment: Performed at [facility], [place] [address], [place], [place] [address]  Lithium level     Status: Abnormal   Collection Time: 10/02/18  9:00 AM  Result Value Ref Range   Lithium Lvl 0.4 (L) 0.6 - 1.2 mmol/L    Comment:                                  Detection Limit = 0.1                           <0.1 indicates None Detected   Comprehensive metabolic panel     Status: None   Collection Time: 10/07/18 12:19 PM  Result Value Ref Range   Glucose 78 65 - 99 mg/dL   BUN 10 6 - 24 mg/dL   Creatinine, Ser 0.77 0.57 - 1.00 mg/dL   GFR calc non Af Amer 96 >59 mL/min/1.73   GFR calc Af Amer 111 >59 mL/min/1.73   BUN/Creatinine Ratio 13 9 - 23   Sodium 138 134 - 144 mmol/L   Potassium 4.4 3.5 - 5.2 mmol/L   Chloride 104 96 - 106 mmol/L   CO2 21 20 - 29 mmol/L   Calcium 9.5 8.7 - 10.2 mg/dL   Total Protein 7.0 6.0 - 8.5 g/dL  Albumin 4.2 3.8 - 4.8 g/dL   Globulin, Total 2.8 1.5 - 4.5 g/dL   Albumin/Globulin Ratio 1.5 1.2 - 2.2   Bilirubin Total 0.4 0.0 - 1.2 mg/dL   Alkaline Phosphatase 65 39 - 117 IU/L   AST 11 0 - 40 IU/L   ALT 13 0 - 32 IU/L  Insulin, random     Status: None   Collection Time: 10/07/18 12:19 PM  Result Value Ref Range   INSULIN 19.5 2.6 - 24.9 uIU/mL  T3     Status: None   Collection Time: 10/07/18 12:19 PM  Result Value Ref Range   T3, Total 115 71 - 180 ng/dL  T4, free     Status: None   Collection Time: 10/07/18 12:19 PM  Result Value Ref Range   Free T4 1.12 0.82 - 1.77 ng/dL  TSH     Status: None   Collection Time: 10/07/18 12:19 PM  Result Value Ref Range   TSH 1.260 0.450 - 4.500 uIU/mL  VITAMIN D 25 Hydroxy (Vit-D Deficiency, Fractures)     Status: Abnormal   Collection Time: 10/07/18 12:19 PM  Result Value Ref Range   Vit D, 25-Hydroxy 7.0  (L) 30.0 - 100.0 ng/mL    Comment: Vitamin D deficiency has been defined by the [place] and an Endocrine Society practice guideline as a level of serum 25-OH vitamin D less than 20 ng/mL (1,2). The Endocrine Society went on to further define vitamin D insufficiency as a level between 21 and 29 ng/mL (2). 1. IOM (Institute of Medicine). 2010. Dietary reference    intakes for calcium and D. [place]: The    [employer]. 2. Holick MF, Binkley Ransom, Bischoff-Ferrari HA, et al.    Evaluation, treatment, and prevention of vitamin D    deficiency: an Endocrine Society clinical practice    guideline. JCEM. 2011 Jul; 96(7):1911-30.   Lipid Panel With LDL/HDL Ratio     Status: Abnormal   Collection Time: 10/07/18 12:20 PM  Result Value Ref Range   Cholesterol, Total 183 100 - 199 mg/dL   Triglycerides 70 0 - 149 mg/dL   HDL 38 (L) >39 mg/dL   VLDL Cholesterol Cal 14 5 - 40 mg/dL   LDL Calculated 131 (H) 0 - 99 mg/dL   LDl/HDL Ratio 3.4 (H) 0.0 - 3.2 ratio    Comment:                                     LDL/HDL Ratio                                             Men  Women                               1/2 Avg.Risk  1.0    1.5                                   Avg.Risk  3.6    3.2  2X Avg.Risk  6.2    5.0                                3X Avg.Risk  8.0    6.1   Hemoglobin A1c     Status: Abnormal   Collection Time: 10/07/18 12:20 PM  Result Value Ref Range   Hgb A1c MFr Bld 5.7 (H) 4.8 - 5.6 %    Comment:          Prediabetes: 5.7 - 6.4          Diabetes: >6.4          Glycemic control for adults with diabetes: <7.0    Est. average glucose Bld gHb Est-mCnc 117 mg/dL  Folate     Status: None   Collection Time: 10/07/18 12:20 PM  Result Value Ref Range   Folate 5.0 >3.0 ng/mL    Comment: A serum folate concentration of less than 3.1 ng/mL is considered to represent clinical deficiency.   Vitamin B12     Status: None    Collection Time: 10/07/18 12:20 PM  Result Value Ref Range   Vitamin B-12 420 232 - 1,245 pg/mL    Observations/Objective: Limited mental status examination done on the phone.  Patient appears to be relevant in conversation.  She describes her mood good.  She does not appear to be distracted.  She denies any auditory or visual hallucination.  She denies any active or passive suicidal thoughts or homicidal thought.  There were no delusions, paranoia or any mania noticed on the phone.  She is alert and oriented x3.  Her speech is clear and coherent.  Her insight and judgment is okay.   Assessment and Plan: Bipolar disorder type I.  Generalized anxiety disorder.  I reviewed blood work results, collateral information from inpatient and group therapy.  She is doing better.  Her medication is adjusted.  She is not taking lithium 600 mg in the morning and 9 mg at bedtime, Lamictal 300 mg daily, Klonopin 0.5 mg as needed for anxiety and Abilify 15 mg at bedtime.  Encouraged to keep appointment with [facility] for therapy.  Discussed medication side effects and benefits.  Her last hemoglobin A1c was 5.7.  Her last lithium level was done on February 28 which was 0.4.  We will do next lithium level on her next appointment.  Discuss safety concern that anytime having active suicidal thoughts or homicidal thought and she need to call 911 of the local emergency room.  Follow-up in 2 months.  Follow Up Instructions:    I discussed the assessment and treatment plan with the patient. The patient was provided an opportunity to ask questions and all were answered. The patient agreed with the plan and demonstrated an understanding of the instructions.   The patient was advised to call back or seek an in-person evaluation if the symptoms worsen or if the condition fails to improve as anticipated.  I provided 30 minutes of non-face-to-face time during this encounter.   Kathlee Nations, MD

## 2018-11-09 ENCOUNTER — Other Ambulatory Visit (HOSPITAL_COMMUNITY): Payer: Medicare Other

## 2018-11-10 ENCOUNTER — Other Ambulatory Visit (HOSPITAL_COMMUNITY): Payer: Medicare Other

## 2018-11-11 ENCOUNTER — Encounter: Payer: Self-pay | Admitting: Neurology

## 2018-11-11 ENCOUNTER — Telehealth: Payer: Self-pay | Admitting: Neurology

## 2018-11-11 ENCOUNTER — Other Ambulatory Visit (HOSPITAL_COMMUNITY): Payer: Medicare Other

## 2018-11-11 NOTE — Telephone Encounter (Signed)
Called the patient to inform them that our office has placed new protocols in place for our office visits. Due to Covid 19 our office is reducing our number of office visits in order to minimize the risk to our patients and healthcare providers.Our office is now providing the capability to offer the patients virtual visits at this time. Informed of what that process looks like and informed that the Virtual visit will still be billed through insurance as such. Due to [place],informed the patient since the appointment is taking place over the phone/internet app, we can't guarantee the security of the phone line. With that said if we do move forward I would have to get verbal consent to completed the call over the phone. Patient gave verbal consent to move forward with the video visit. I have reviewed the patient's chart and made sure that everything is up to date. Patient is also made aware that since this is a video visit we are able to complete the visit but a physical exam is not able to be done since the patient is not present in person. Pt agrees to filling out the sleep scale and measuring her neck circumference and replying the answers to me prior to the apt. Pt's email is [email or web address]. Pt understands that the [employer] webex software must be downloaded and operational on the device pt plans to use for the visit. Pt verbalized understanding of this information and will states to be ready for the visit at least 15 min prior to the visit.

## 2018-11-12 ENCOUNTER — Other Ambulatory Visit: Payer: Self-pay

## 2018-11-12 ENCOUNTER — Encounter (HOSPITAL_COMMUNITY): Payer: Self-pay | Admitting: Emergency Medicine

## 2018-11-12 ENCOUNTER — Emergency Department (HOSPITAL_COMMUNITY)
Admission: EM | Admit: 2018-11-12 | Discharge: 2018-11-12 | Disposition: A | Discharge status: Home / Self Care | Payer: Medicare Other | Attending: Emergency Medicine | Admitting: Emergency Medicine

## 2018-11-12 ENCOUNTER — Other Ambulatory Visit (HOSPITAL_COMMUNITY): Payer: Medicare Other

## 2018-11-12 DIAGNOSIS — I10 Essential (primary) hypertension: Secondary | ICD-10-CM | POA: Diagnosis not present

## 2018-11-12 DIAGNOSIS — R1033 Periumbilical pain: Secondary | ICD-10-CM | POA: Diagnosis present

## 2018-11-12 DIAGNOSIS — K420 Umbilical hernia with obstruction, without gangrene: Secondary | ICD-10-CM

## 2018-11-12 DIAGNOSIS — K429 Umbilical hernia without obstruction or gangrene: Secondary | ICD-10-CM | POA: Insufficient documentation

## 2018-11-12 DIAGNOSIS — R1110 Vomiting, unspecified: Secondary | ICD-10-CM | POA: Diagnosis not present

## 2018-11-12 DIAGNOSIS — Z79899 Other long term (current) drug therapy: Secondary | ICD-10-CM | POA: Diagnosis not present

## 2018-11-12 MED ORDER — TRAMADOL HCL 50 MG PO TABS: 50.0000 mg | ORAL_TABLET | Freq: Three times a day (TID) | ORAL | 0 refills | Status: DC | PRN

## 2018-11-12 NOTE — ED Provider Notes (Signed)
[facility] DEPT Provider Note   CSN: [id number] Arrival date & time: 11/12/18  1540    History   Chief Complaint Chief Complaint  Patient presents with  . Abdominal Pain  . Hernia  . Emesis    HPI Christine [name] is a 43 y.o. female.     HPI   52yF with abdominal pain. Known umbilical hernia. Began having severe pain and hard knot around belly button this morning. Persistent since. Nausea and vomited x1. No fever or chills. No urinary complaints. No diarrhea.   Past Medical History:  Diagnosis Date  . Anemia   . Anxiety   . Arthritis    Hands, ankles  . Back pain   . Bipolar 1 disorder ([place])   . Chest pain   . Depression   . Depression   . Dyspnea   . Fibroids   . Fibromyalgia   . Headache   . High blood pressure   . History of blood transfusion 08/2017   [facility]  . History of hiatal hernia   . Infertility, female   . Joint pain   . Lactose intolerance   . Lower extremity edema   . Mental disorder   . Migraines   . Osteoarthritis   . PCOS (polycystic ovarian syndrome)   . Seasonal allergies   . Vitamin D deficiency     Patient Active Problem List   Diagnosis Date Noted  . Chronic migraine without aura without status migrainosus, not intractable 09/21/2018  . Bipolar 1 disorder, depressed, severe ([place]) 09/08/2018  . Endometrium, polyp 01/15/2018    Class: Present on Admission  . Fibroids, submucosal 01/15/2018    Class: Present on Admission  . Iron deficiency anemia due to chronic blood loss 09/16/2017  . Menorrhagia with regular cycle   . Symptomatic anemia 08/28/2017  . Acute blood loss anemia 08/28/2017  . Bipolar disorder ([place]) 08/28/2017  . Anxiety 08/28/2017  . Depression 08/28/2017  . Fibroids 08/28/2017  . PCOS (polycystic ovarian syndrome) 08/28/2017  . Fever 08/28/2017  . HTN (hypertension) 08/28/2017  . Morbid obesity ([place]) 12/04/2015  . Pregnancy with history of uterine myomectomy 04/07/2015  . History  of right oophorectomy 02/21/2015  . History of recurrent miscarriages 02/01/2015  . Vitamin D deficiency 02/01/2015    Past Surgical History:  Procedure Laterality Date  . CERVICAL POLYPECTOMY  01/15/2018   Procedure: CERVICAL POLYPECTOMY;  Surgeon: Arvella Nigh, MD;  Location: [facility];  Service: Gynecology;;  . COLONOSCOPY  2008   Normal  . DIAGNOSTIC LAPAROSCOPY  08/1999   dermoid cyst, RSO  . DILATION AND CURETTAGE OF UTERUS  05/2004   MAB  . DILATION AND CURETTAGE OF UTERUS  04/2015  . HYSTEROSCOPY W/D&C  07/22/2011   Procedure: DILATATION AND CURETTAGE /HYSTEROSCOPY;  Surgeon: Cyril Mourning, MD;  Location: [facility];  Service: Gynecology;;  . HYSTEROSCOPY W/D&C N/A 01/15/2018   Procedure: DILATATION AND CURETTAGE [place] WITH MYOSURE;  Surgeon: Arvella Nigh, MD;  Location: [facility];  Service: Gynecology;  Laterality: N/A;  . 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 Authorizing Provider  amLODipine (NORVASC) 10 MG tablet Take 1 tablet (10 mg total) by mouth daily. 08/29/17   Eugenie Filler, MD  ARIPiprazole (ABILIFY) 15 MG tablet Take 1 tablet (15 mg total) by mouth daily. 11/06/18 11/06/19  [name], [name], MD  clonazePAM (KLONOPIN) 0.5 MG tablet Take 0.5 mg by mouth 2 (two) times daily as needed for anxiety.    [provider]  Erenumab-aooe (AIMOVIG) 140 MG/ML SOAJ Inject 140 mg into the skin every 30 (thirty) days. 09/21/18   Melvenia Beam, MD  lamoTRIgine (LAMICTAL) 150 MG tablet Take 1 tablet (150 mg total) by mouth 2 (two) times daily. For mood 11/06/18   [name], [name], MD  lithium carbonate 300 MG capsule Take 2 capsule in and 3 capsule at bed time Patient taking differently: Take  600-900 mg by mouth 2 (two) times daily with a meal. Take 600mg  in the morning and 900mg  at bed time 11/06/18   [name], [name], MD  metFORMIN (GLUCOPHAGE) 500 MG tablet Take 1 tablet (500 mg total) by mouth daily with breakfast. 10/21/18   [name], MD  metoprolol succinate (TOPROL-XL) 50 MG 24 hr tablet Take 50 mg by mouth daily. Take with or immediately following a meal.    [provider]  traMADol (ULTRAM) 50 MG tablet Take 1 tablet (50 mg total) by mouth every 8 (eight) hours as needed. 11/12/18   Virgel Manifold, MD  Vitamin D, Ergocalciferol, (DRISDOL) 1.25 MG (50000 UT) CAPS capsule Take 1 capsule (50,000 Units total) by mouth every 7 (seven) days. 10/21/18   Starlyn Skeans, MD    Family History Family History  Problem Relation Age of Onset  . Anxiety disorder Mother   . Depression Mother   . Cancer Mother        pancreatic   . High blood pressure Mother   . Hypertension Mother   . Hyperlipidemia Mother   . Cancer Father        colon  . High blood pressure Father   . Diabetes Father   . Hypertension Father   . Hyperlipidemia Father   . Heart disease Father   . Stroke Father   . Kidney disease Father   . Sleep apnea Father   . Obesity Father   . Cancer Brother 6       leukemia, childhood  . High blood pressure Brother   . Migraines Neg Hx     Social History Social History   Tobacco Use  . Smoking status: Never Smoker  . Smokeless tobacco: Never Used  Substance Use Topics  . Alcohol use: Yes    Comment: occasional, maybe once a month or so  . Drug use: No     Allergies   Penicillins   Review of Systems Review of Systems  All systems reviewed and negative, other than as noted in HPI.  Physical Exam Updated Vital Signs BP (!) 189/104 (BP Location: Right Arm)   Pulse (!) 114   Temp 98.6 F (37 C) (Oral)   Resp 20   LMP 10/29/2018   SpO2 98%   Physical Exam Vitals signs and nursing note reviewed.  Constitutional:      General: She is  not in acute distress.    Appearance: She is well-developed. She is obese.  HENT:     Head: Normocephalic and atraumatic.  Eyes:  General:        Right eye: No discharge.        Left eye: No discharge.     Conjunctiva/sclera: Conjunctivae normal.  Neck:     Musculoskeletal: Neck supple.  Cardiovascular:     Rate and Rhythm: Normal rate and regular rhythm.     Heart sounds: Normal heart sounds. No murmur. No friction rub. No gallop.   Pulmonary:     Effort: Pulmonary effort is normal. No respiratory distress.     Breath sounds: Normal breath sounds.  Abdominal:     General: There is no distension.     Palpations: Abdomen is soft.     Tenderness: There is abdominal tenderness.     Comments: Obese abdomen. ~Golf ball sized mass palpable at umbilicus. Extremely tender. No overlying skin changes. Able to reduce. Palpable defect in abdominal wall.   Musculoskeletal:        General: No tenderness.  Skin:    General: Skin is warm and dry.  Neurological:     Mental Status: She is alert.  Psychiatric:        Behavior: Behavior normal.        Thought Content: Thought content normal.      ED Treatments / Results  Labs (all labs ordered are listed, but only abnormal results are displayed) Labs Reviewed - No data to display  EKG None  Radiology No results found.  Procedures Hernia reduction Date/Time: 11/12/2018 10:00 AM Performed by: Virgel Manifold, MD Authorized by: Virgel Manifold, MD  Consent: Verbal consent obtained. Consent given by: patient Patient identity confirmed: provided demographic data and verbally with patient  Sedation: Patient sedated: no  Patient tolerance: Patient tolerated the procedure well with no immediate complications Comments: Laid supine. Advised to relax the best she could. Umbilical hernia reduced with constant increasing pressure. Still some soreness but pain much improved.     (including critical care time)  Medications Ordered in ED  Medications - No data to display   Initial Impression / Assessment and Plan / ED Course  I have reviewed the triage vital signs and the nursing notes.  Pertinent labs & imaging results that were available during my care of the patient were reviewed by me and considered in my medical decision making (see chart for details).        82yF with incarcerated umbilical hernia. I was able to reduce it. She was actually scheduled to have this repaired in February but it had to be canceled and rescheduling has been deferred given the current climate of uncertainty surrounding COVID-19 pandemic. At this point, there is not a whole more to do emergently. I covered return precautions. Instructed to reduce straining the best she can. A repair now would be elective and hopefully she can get through the next couple months to have this repaired.   Final Clinical Impressions(s) / ED Diagnoses   Final diagnoses:  Incarcerated umbilical hernia    ED Discharge Orders         Ordered    traMADol (ULTRAM) 50 MG tablet  Every 8 hours PRN     11/12/18 1006           Virgel Manifold, MD 11/14/18 1147

## 2018-11-12 NOTE — ED Triage Notes (Signed)
Pt reports has umbilical hernia and had problems with it before. This morning has bulging and sever pain at site with n/v.

## 2018-11-13 ENCOUNTER — Other Ambulatory Visit (HOSPITAL_COMMUNITY): Payer: Medicare Other

## 2018-11-16 ENCOUNTER — Other Ambulatory Visit (HOSPITAL_COMMUNITY): Payer: Medicare Other

## 2018-11-17 ENCOUNTER — Other Ambulatory Visit (HOSPITAL_COMMUNITY): Payer: Medicare Other

## 2018-11-17 DIAGNOSIS — R05 Cough: Secondary | ICD-10-CM | POA: Diagnosis not present

## 2018-11-18 ENCOUNTER — Other Ambulatory Visit: Payer: Self-pay

## 2018-11-18 ENCOUNTER — Other Ambulatory Visit (HOSPITAL_COMMUNITY): Payer: Medicare Other

## 2018-11-18 ENCOUNTER — Ambulatory Visit (INDEPENDENT_AMBULATORY_CARE_PROVIDER_SITE_OTHER): Payer: Medicare Other | Admitting: Family Medicine

## 2018-11-18 DIAGNOSIS — E559 Vitamin D deficiency, unspecified: Secondary | ICD-10-CM | POA: Diagnosis not present

## 2018-11-18 DIAGNOSIS — Z6841 Body Mass Index (BMI) 40.0 and over, adult: Secondary | ICD-10-CM | POA: Diagnosis not present

## 2018-11-18 DIAGNOSIS — R7303 Prediabetes: Secondary | ICD-10-CM

## 2018-11-18 MED ORDER — VITAMIN D (ERGOCALCIFEROL) 1.25 MG (50000 UNIT) PO CAPS: 50000.0000 [IU] | ORAL_CAPSULE | ORAL | 0 refills | Status: DC

## 2018-11-18 MED ORDER — METFORMIN HCL 500 MG PO TABS: 500.0000 mg | ORAL_TABLET | Freq: Every day | ORAL | 0 refills | Status: DC

## 2018-11-18 NOTE — Progress Notes (Signed)
Office: [phone]  /  Fax: [phone] TeleHealth Visit:  Christine [name] has verbally consented to this TeleHealth visit today. The patient is located at home, the provider is located at the [facility] office. The participants in this visit include the listed provider and patient. Christine [name] was unable to use Face Time today and the Telehealth visit was conducted via telephone.   HPI:   Chief Complaint: OBESITY Christine [name] is here to discuss her progress with her obesity treatment plan. She is on the Category 3 plan and is following her eating plan approximately 33 % of the time. She states she is exercising 0 minutes 0 times per week. Christine [name] feels that she is doing well with weight loss and thinks that she has lost 2 pounds since her last visit. She isn't following her plan closely due to grocery shortages. Christine [name] is working on increasing vegetables.  We were unable to weigh the patient today for this TeleHealth visit. She feels as if she has lost weight since her last visit. She has lost 2 lbs since starting treatment with [place].  Pre-Diabetes Christine [name] has a diagnosis of pre-diabetes based on her elevated Hgb A1c of 5.7 on 10/07/18 and was informed this puts her at greater risk of developing diabetes. She is doing well with metformin and her diet. Christine [name] continues to work on diet and exercise to decrease risk of diabetes.   Vitamin D Deficiency Christine [name] has a diagnosis of vitamin D deficiency. Her last vitamin D level was very low at 7.0 on 10/07/18 and she is now on prescription vit D, but is not yet at goal. Christine [name] denies nausea, vomiting, or muscle weakness.  ASSESSMENT AND PLAN:  Prediabetes - Plan: metFORMIN (GLUCOPHAGE) 500 MG tablet  Vitamin D deficiency - Plan: Vitamin D, Ergocalciferol, (DRISDOL) 1.25 MG (50000 UT) CAPS capsule  Class 3 severe obesity with serious comorbidity and body mass index (BMI) of 50.0 to 59.9 in adult, unspecified obesity type ([place])  PLAN:   Pre-Diabetes Christine [name] will continue to work on weight loss, exercise, and decreasing simple carbohydrates in her diet to help decrease the risk of diabetes. She was informed that eating too many simple carbohydrates or too many calories at one sitting increases the likelihood of GI side effects. [name] agreed to continue metformin 500 mg qAM #30 with no refills and a prescription was written today. [name] agreed to follow up with [place] as directed to monitor her progress in 2 weeks.  Vitamin D Deficiency [name] was informed that low vitamin D levels contribute to fatigue and are associated with obesity, breast, and colon cancer. [name] agrees to continue to take prescription Vit D @50 ,000 IU every week #4 with no refills and will follow up for routine testing of vitamin D, at least 2-3 times per year. She was informed of the risk of over-replacement of vitamin D and agrees to not increase her dose unless she discusses this with [place] first. Christine [name] agrees to follow up in 2 weeks as directed.  Obesity Christine [name] is currently in the action stage of change. As such, her goal is to continue with weight loss efforts. She has agreed to follow the Category 3 plan. Christine [name] has been instructed to work up to a goal of 150 minutes of combined cardio and strengthening exercise per week for weight loss and overall health benefits. We discussed the following Behavioral Modification Strategies today: increasing lean protein intake, increasing vegetables, keeping healthy foods in the home, and work on meal planning and  easy cooking plans.  Christine [name] has agreed to follow up with our clinic in 2 weeks. She was informed of the importance of frequent follow up visits to maximize her success with intensive lifestyle modifications for her multiple health conditions.  ALLERGIES: Allergies  Allergen Reactions  . Penicillins Rash and Other (See Comments)    Has patient had a PCN reaction causing immediate rash, facial/tongue/throat  swelling, SOB or lightheadedness with hypotension: Yes Has patient had a PCN reaction causing severe rash involving mucus membranes or skin necrosis: Unknown Has patient had a PCN reaction that required hospitalization: No Has patient had a PCN reaction occurring within the last 10 years: No If all of the above answers are "NO", then may proceed with Cephalosporin use.     MEDICATIONS: Current Outpatient Medications on File Prior to Visit  Medication Sig Dispense Refill  . amLODipine (NORVASC) 10 MG tablet Take 1 tablet (10 mg total) by mouth daily. 30 tablet 0  . ARIPiprazole (ABILIFY) 15 MG tablet Take 1 tablet (15 mg total) by mouth daily. 30 tablet 0  . clonazePAM (KLONOPIN) 0.5 MG tablet Take 0.5 mg by mouth 2 (two) times daily as needed for anxiety.    [name] (AIMOVIG) 140 MG/ML SOAJ Inject 140 mg into the skin every 30 (thirty) days. 1 pen 11  . lamoTRIgine (LAMICTAL) 150 MG tablet Take 1 tablet (150 mg total) by mouth 2 (two) times daily. For mood 60 tablet 1  . lithium carbonate 300 MG capsule Take 2 capsule in and 3 capsule at bed time (Patient taking differently: Take 600-900 mg by mouth 2 (two) times daily with a meal. Take 600mg  in the morning and 900mg  at bed time) 150 capsule 1  . metFORMIN (GLUCOPHAGE) 500 MG tablet Take 1 tablet (500 mg total) by mouth daily with breakfast. 30 tablet 0  . metoprolol succinate (TOPROL-XL) 50 MG 24 hr tablet Take 50 mg by mouth daily. Take with or immediately following a meal.    . traMADol (ULTRAM) 50 MG tablet Take 1 tablet (50 mg total) by mouth every 8 (eight) hours as needed. 10 tablet 0  . Vitamin D, Ergocalciferol, (DRISDOL) 1.25 MG (50000 UT) CAPS capsule Take 1 capsule (50,000 Units total) by mouth every 7 (seven) days. 4 capsule 0   No current facility-administered medications on file prior to visit.     PAST MEDICAL HISTORY: Past Medical History:  Diagnosis Date  . Anemia   . Anxiety   . Arthritis    Hands, ankles  .  Back pain   . Bipolar 1 disorder ([place])   . Chest pain   . Depression   . Depression   . Dyspnea   . Fibroids   . Fibromyalgia   . Headache   . High blood pressure   . History of blood transfusion 08/2017   [facility]  . History of hiatal hernia   . Infertility, female   . Joint pain   . Lactose intolerance   . Lower extremity edema   . Mental disorder   . Migraines   . Osteoarthritis   . PCOS (polycystic ovarian syndrome)   . Seasonal allergies   . Vitamin D deficiency     PAST SURGICAL HISTORY: Past Surgical History:  Procedure Laterality Date  . CERVICAL POLYPECTOMY  01/15/2018   Procedure: CERVICAL POLYPECTOMY;  Surgeon: Arvella Nigh, MD;  Location: [facility];  Service: Gynecology;;  . COLONOSCOPY  2008   Normal  . DIAGNOSTIC LAPAROSCOPY  08/1999  dermoid cyst, RSO  . DILATION AND CURETTAGE OF UTERUS  05/2004   MAB  . DILATION AND CURETTAGE OF UTERUS  04/2015  . HYSTEROSCOPY W/D&C  07/22/2011   Procedure: DILATATION AND CURETTAGE /HYSTEROSCOPY;  Surgeon: Cyril Mourning, MD;  Location: [facility];  Service: Gynecology;;  . HYSTEROSCOPY W/D&C N/A 01/15/2018   Procedure: DILATATION AND CURETTAGE [place] WITH MYOSURE;  Surgeon: Arvella Nigh, MD;  Location: [facility];  Service: Gynecology;  Laterality: N/A;  . 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 Smoking status: Never Smoker  . Smokeless tobacco: Never Used  Substance Use Topics  . Alcohol use: Yes    Comment: occasional, maybe once a month or so  . Drug use: No    FAMILY HISTORY: Family History  Problem Relation Age of Onset  . Anxiety disorder Mother   . Depression Mother   . Cancer Mother        pancreatic   . High blood pressure Mother   . Hypertension Mother   . Hyperlipidemia Mother   . Cancer Father        colon  . High blood pressure Father   . Diabetes Father   . Hypertension Father   . Hyperlipidemia Father   . Heart disease Father   .  Stroke Father   . Kidney disease Father   . Sleep apnea Father   . Obesity Father   . Cancer Brother 6       leukemia, childhood  . High blood pressure Brother   . Migraines Neg Hx     ROS: Review of Systems  Gastrointestinal: Negative for nausea and vomiting.  Musculoskeletal:       Negative for muscle weakness.    PHYSICAL EXAM: Pt in no acute distress  RECENT LABS AND TESTS: BMET    Component Value Date/Time   NA 138 10/07/2018 1219   K 4.4 10/07/2018 1219   CL 104 10/07/2018 1219   CO2 21 10/07/2018 1219   GLUCOSE 78 10/07/2018 1219   GLUCOSE 94 09/07/2018 1718   BUN 10 10/07/2018 1219   CREATININE 0.77 10/07/2018 1219   CREATININE 0.88 03/19/2018 1051   CALCIUM 9.5 10/07/2018 1219   GFRNONAA 96 10/07/2018 1219   GFRNONAA >60 03/19/2018 1051   GFRAA 111 10/07/2018 1219   GFRAA >60 03/19/2018 1051   Lab Results  Component Value Date   HGBA1C 5.7 (H) 10/07/2018   Lab Results  Component Value Date   INSULIN 19.5 10/07/2018   CBC    Component Value Date/Time   WBC 12.0 (H) 09/07/2018 1718   RBC 4.37 09/07/2018 1718   HGB 10.1 (L) 09/07/2018 1718   HGB 12.6 03/19/2018 1051   HCT 36.5 09/07/2018 1718   PLT 404 (H) 09/07/2018 1718   PLT 266 03/19/2018 1051   MCV 83.5 09/07/2018 1718   MCH 23.1 (L) 09/07/2018 1718   MCHC 27.7 (L) 09/07/2018 1718   RDW 17.4 (H) 09/07/2018 1718   LYMPHSABS 1.9 08/08/2018 1312   MONOABS 1.0 08/08/2018 1312   EOSABS 0.1 08/08/2018 1312   BASOSABS 0.0 08/08/2018 1312   Iron/TIBC/Ferritin/ %Sat    Component Value Date/Time   IRON 52 03/19/2018 1051   TIBC 311 03/19/2018 1051   FERRITIN 30 03/19/2018 1051   IRONPCTSAT 17 (L) 03/19/2018 1051   Lipid Panel     Component Value Date/Time   CHOL 183 10/07/2018 1220   TRIG  70 10/07/2018 1220   HDL 38 (L) 10/07/2018 1220   LDLCALC 131 (H) 10/07/2018 1220   Hepatic Function Panel     Component Value Date/Time   PROT 7.0 10/07/2018 1219   ALBUMIN 4.2 10/07/2018 1219    AST 11 10/07/2018 1219   AST 8 (L) 03/19/2018 1051   ALT 13 10/07/2018 1219   ALT 12 03/19/2018 1051   ALKPHOS 65 10/07/2018 1219   BILITOT 0.4 10/07/2018 1219   BILITOT 0.4 03/19/2018 1051      Component Value Date/Time   TSH 1.260 10/07/2018 1219   TSH 1.460 04/28/2018 1130   Results for ROANNA, REAVES (MRN [id number]) as of 11/18/2018 15:20  Ref. Range 10/07/2018 12:19  Vitamin D, 25-Hydroxy Latest Ref Range: 30.0 - 100.0 ng/mL 7.0 (L)     I, Marcille Blanco, CMA, am acting as transcriptionist for Starlyn Skeans, MD I have reviewed the above documentation for accuracy and completeness, and I agree with the above. -Dennard Nip, MD

## 2018-11-19 ENCOUNTER — Other Ambulatory Visit (HOSPITAL_COMMUNITY): Payer: Medicare Other

## 2018-11-20 ENCOUNTER — Other Ambulatory Visit (HOSPITAL_COMMUNITY): Payer: Medicare Other

## 2018-11-23 ENCOUNTER — Telehealth: Payer: Self-pay | Admitting: Neurology

## 2018-11-23 ENCOUNTER — Other Ambulatory Visit: Payer: Self-pay

## 2018-11-23 ENCOUNTER — Encounter: Payer: BC Managed Care – PPO | Admitting: Neurology

## 2018-11-23 NOTE — Progress Notes (Deleted)
Virtual Visit via Video Note  I connected with Louis Meckel on 11/23/18 at 11:00 AM EDT by a video enabled telemedicine application and verified that I am speaking with the correct person using two identifiers.   I discussed the limitations of evaluation and management by telemedicine and the availability of in person appointments. The patient expressed understanding and agreed to proceed.    Larey Seat, MD   [facility]   Provider:  Larey Seat, MD    Primary Care Physician:  Maude Leriche, PA-C   Referring Luis Lopez.      HPI:  Christine [name] is a 43 y.o. female , seen here as in a referral/ revisit  from Dr. Perlie Mayo for ***.  ***  Chief complaint according to patient :      Sleep medical history    family medical/ sleep history:    Social history:   Sleep habits are as follows:   Review of Systems: Out of a complete 14 system review, the patient complains of only the following symptoms, and all other reviewed systems are negative. ***  How likely are you to doze in the following situations: 0 = not likely, 1 = slight chance, 2 = moderate chance, 3 = high chance  Sitting and Reading? Watching Television? Sitting inactive in a public place (theater or meeting)? Lying down in the afternoon when circumstances permit? Sitting and talking to someone? Sitting quietly after lunch without alcohol? In a car, while stopped for a few minutes in traffic? As a passenger in a car for an hour without a break?  Total =    Social History   Socioeconomic History  . Marital status: Married    Spouse name: Bernie Fobes  . Number of children: 2  . Years of education: 2 years of grad school + bach & HS  . Highest education level: Bachelor's degree (e.g., BA, AB, BS)  Occupational History  . Occupation: disabled  Social Needs  . Financial resource strain: Somewhat hard  . Food insecurity:    Worry: Sometimes true    Inability:  Sometimes true  . Transportation needs:    Medical: No    Non-medical: No  Tobacco Use  . Smoking status: Never Smoker  . Smokeless tobacco: Never Used  Substance and Sexual Activity  . Alcohol use: Yes    Comment: occasional, maybe once a month or so  . Drug use: No  . Sexual activity: Yes    Partners: Male    Birth control/protection: None  Lifestyle  . Physical activity:    Days per week: 0 days    Minutes per session: Not on file  . Stress: Rather much  Relationships  . Social connections:    Talks on phone: Never    Gets together: Never    Attends religious service: Never    Active member of club or organization: No    Attends meetings of clubs or organizations: Never    Relationship status: Not on file  . Intimate partner violence:    Fear of current or ex partner: No    Emotionally abused: No    Physically abused: No    Forced sexual activity: No  Other Topics Concern  . Not on file  Social History Narrative   Lives at home with her husband and daughters   Right handed   Caffeine: decreased intake, drinks rarely     Family History  Problem Relation Age of Onset  . Anxiety disorder Mother   .  Depression Mother   . Cancer Mother        pancreatic   . High blood pressure Mother   . Hypertension Mother   . Hyperlipidemia Mother   . Cancer Father        colon  . High blood pressure Father   . Diabetes Father   . Hypertension Father   . Hyperlipidemia Father   . Heart disease Father   . Stroke Father   . Kidney disease Father   . Sleep apnea Father   . Obesity Father   . Cancer Brother 6       leukemia, childhood  . High blood pressure Brother   . Migraines Neg Hx     Past Medical History:  Diagnosis Date  . Anemia   . Anxiety   . Arthritis    Hands, ankles  . Back pain   . Bipolar 1 disorder ([place])   . Chest pain   . Depression   . Depression   . Dyspnea   . Fibroids   . Fibromyalgia   . Headache   . High blood pressure   . History of  blood transfusion 08/2017   [facility]  . History of hiatal hernia   . Infertility, female   . Joint pain   . Lactose intolerance   . Lower extremity edema   . Mental disorder   . Migraines   . Osteoarthritis   . PCOS (polycystic ovarian syndrome)   . Seasonal allergies   . Vitamin D deficiency     Past Surgical History:  Procedure Laterality Date  . CERVICAL POLYPECTOMY  01/15/2018   Procedure: CERVICAL POLYPECTOMY;  Surgeon: Arvella Nigh, MD;  Location: [facility];  Service: Gynecology;;  . COLONOSCOPY  2008   Normal  . DIAGNOSTIC LAPAROSCOPY  08/1999   dermoid cyst, [facility]  . DILATION AND CURETTAGE OF UTERUS  05/2004   [facility]  . DILATION AND CURETTAGE OF UTERUS  04/2015  . HYSTEROSCOPY W/D&C  07/22/2011   Procedure: DILATATION AND CURETTAGE /HYSTEROSCOPY;  Surgeon: Cyril Mourning, MD;  Location: [facility];  Service: Gynecology;;  . HYSTEROSCOPY W/D&C N/A 01/15/2018   Procedure: DILATATION AND CURETTAGE [place] WITH MYOSURE;  Surgeon: Arvella Nigh, MD;  Location: [facility];  Service: Gynecology;  Laterality: N/A;  . 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 amLODipine (NORVASC) 10 MG tablet Take 1 tablet (10 mg total) by mouth daily. 30 tablet 0  . ARIPiprazole (ABILIFY) 15 MG tablet Take 1 tablet (15 mg total) by mouth daily. 30 tablet 0  . clonazePAM (KLONOPIN) 0.5 MG tablet Take 0.5 mg by mouth 2 (two) times daily as needed for anxiety.    [name] (AIMOVIG) 140 MG/ML SOAJ Inject 140 mg into the skin every 30 (thirty) days. 1 pen 11  . lamoTRIgine (LAMICTAL) 150 MG tablet Take 1 tablet (150 mg total) by mouth 2 (two) times daily. For mood 60 tablet 1  . lithium carbonate 300 MG capsule Take 2 capsule in and 3 capsule at bed time (Patient taking differently: Take 600-900 mg by mouth 2 (two) times daily with a meal. Take 600mg  in the morning and 900mg  at bed time) 150 capsule 1  . metFORMIN (GLUCOPHAGE) 500 MG  tablet Take 1 tablet (500 mg total) by mouth daily with breakfast. 30 tablet 0  . metoprolol succinate (TOPROL-XL) 50 MG 24 hr tablet Take 50 mg by mouth daily.  Take with or immediately following a meal.    . traMADol (ULTRAM) 50 MG tablet Take 1 tablet (50 mg total) by mouth every 8 (eight) hours as needed. 10 tablet 0  . Vitamin D, Ergocalciferol, (DRISDOL) 1.25 MG (50000 UT) CAPS capsule Take 1 capsule (50,000 Units total) by mouth every 7 (seven) days. 4 capsule 0   No current facility-administered medications for this visit.     Allergies as of 11/23/2018 - Review Complete 11/12/2018  Allergen Reaction Noted  . Penicillins Rash and Other (See Comments) 07/12/2011    Vitals: LMP 10/29/2018  Last Weight:  Wt Readings from Last 1 Encounters:  10/21/18 (!) 335 lb (152 kg)   GUR:KYHCW is no height or weight on file to calculate BMI.     Last Height:   Ht Readings from Last 1 Encounters:  10/21/18 5\' 4"  (1.626 m)     Observations/Objective:   General: The patient is awake, alert and appears not in acute distress. The patient is well groomed. Head: Normocephalic, atraumatic. Neck is supple. Mallampati***,  neck circumference:***. Nasal airflow ***, TMJ is  *** evident . Retrognathia is ***seen.  Cardiovascular:  Regular rate and rhythm***, without  murmurs or carotid bruit, and without distended neck veins. Respiratory: Lungs are clear to auscultation. Skin:  Without evidence of edema, or rash Trunk: BMI is***. The patient's posture is***   Neurologic exam : The patient is awake and alert, oriented to place and time.   Memory subjective *** described as intact.  Memory testing revealed ***   MOCA:No flowsheet data found. MMSE:No flowsheet data found.     Attention span & concentration ability appears normal.  Speech is fluent,  without *** dysarthria, dysphonia or aphasia.  Mood and affect are appropriate.  Cranial nerves: Pupils are equal and briskly reactive to  light. Funduscopic exam without *** evidence of pallor or edema. Extraocular movements  in vertical and horizontal planes intact and without nystagmus. Visual fields by finger perimetry are intact. Hearing to finger rub intact.   Facial sensation intact to fine touch.  Facial motor strength is symmetric and tongue and uvula move midline. Shoulder shrug was symmetrical.   Motor exam:  *** Normal tone, muscle bulk and symmetric strength in all extremities.  Sensory:  Fine touch, pinprick and vibration were tested in all extremities. Proprioception tested in the upper extremities was normal.  Coordination: Rapid alternating movements in the fingers/hands was normal. Finger-to-nose maneuver  normal without evidence of ataxia, dysmetria or tremor.  Gait and station: Patient walks without assistive device and is able unassisted to climb up to the exam table. Strength within normal limits.  Stance is stable and normal.  Toe and hell stand were tested .Tandem gait is unfragmented. Turns with  ***  Steps. Romberg testing is *** negative.  Deep tendon reflexes: in the  upper and lower extremities are symmetric and intact. Babinski maneuver response is *** downgoing.    Assessment:  After physical and neurologic examination, review of laboratory studies,  Personal review of imaging studies, reports of other /same  Imaging studies, results of polysomnography and / or neurophysiology testing and pre-existing records as far as provided in visit., my assessment is   1)  2)  3)   The patient was advised of the nature of the diagnosed disorder , the treatment options and the  risks for general health and wellness arising from not treating the condition.   I spent more than *** minutes of face to face  time with the patient.  Greater than 50% of time was spent in counseling and coordination of care. We have discussed the diagnosis and differential and I answered the patient's questions.    Plan:   Treatment plan and additional workup : ***   No orders of the defined types were placed in this encounter.    No orders of the defined types were placed in this encounter.    No follow-ups on file.        Assessment and Plan:   Follow Up Instructions:    I discussed the assessment and treatment plan with the patient. The patient was provided an opportunity to ask questions and all were answered. The patient agreed with the plan and demonstrated an understanding of the instructions.   The patient was advised to call back or seek an in-person evaluation if the symptoms worsen or if the condition fails to improve as anticipated.  I provided *** minutes of non-face-to-face time during this encounter.    Larey Seat, MD 01/22/3558, 74:16 AM  Certified in Neurology by ABPN Certified in [place] by [facility] [address], [place], [place] [address]

## 2018-11-23 NOTE — Telephone Encounter (Signed)
Called the patient and she states that she forgot the apt scheduled for today. Apologized. Rescheduled her apt may 5th at 1 pm. Advised the pt I will resend email for her apt. Pt verbalized understanding. Advised that she will need to make sure she answers the call prior to apt to be checked in. Pt verbalized understanding.

## 2018-11-24 ENCOUNTER — Other Ambulatory Visit (HOSPITAL_COMMUNITY): Payer: Medicare Other

## 2018-11-25 ENCOUNTER — Other Ambulatory Visit (HOSPITAL_COMMUNITY): Payer: Medicare Other

## 2018-11-26 ENCOUNTER — Other Ambulatory Visit (HOSPITAL_COMMUNITY): Payer: Medicare Other

## 2018-11-27 ENCOUNTER — Other Ambulatory Visit (HOSPITAL_COMMUNITY): Payer: Medicare Other

## 2018-11-30 ENCOUNTER — Other Ambulatory Visit (HOSPITAL_COMMUNITY): Payer: Medicare Other

## 2018-12-01 ENCOUNTER — Other Ambulatory Visit (HOSPITAL_COMMUNITY): Payer: Medicare Other

## 2018-12-02 ENCOUNTER — Other Ambulatory Visit (HOSPITAL_COMMUNITY): Payer: Medicare Other

## 2018-12-03 ENCOUNTER — Other Ambulatory Visit (HOSPITAL_COMMUNITY): Payer: Medicare Other

## 2018-12-03 ENCOUNTER — Encounter: Payer: Self-pay | Admitting: Neurology

## 2018-12-08 ENCOUNTER — Other Ambulatory Visit: Payer: Self-pay

## 2018-12-08 ENCOUNTER — Other Ambulatory Visit (HOSPITAL_COMMUNITY): Payer: Self-pay

## 2018-12-08 ENCOUNTER — Ambulatory Visit (INDEPENDENT_AMBULATORY_CARE_PROVIDER_SITE_OTHER): Payer: Medicare Other | Admitting: Neurology

## 2018-12-08 ENCOUNTER — Encounter: Payer: Self-pay | Admitting: Neurology

## 2018-12-08 DIAGNOSIS — D62 Acute posthemorrhagic anemia: Secondary | ICD-10-CM

## 2018-12-08 DIAGNOSIS — G4733 Obstructive sleep apnea (adult) (pediatric): Secondary | ICD-10-CM | POA: Insufficient documentation

## 2018-12-08 DIAGNOSIS — Z6841 Body Mass Index (BMI) 40.0 and over, adult: Secondary | ICD-10-CM | POA: Diagnosis not present

## 2018-12-08 DIAGNOSIS — I10 Essential (primary) hypertension: Secondary | ICD-10-CM | POA: Diagnosis not present

## 2018-12-08 DIAGNOSIS — F5104 Psychophysiologic insomnia: Secondary | ICD-10-CM | POA: Diagnosis not present

## 2018-12-08 DIAGNOSIS — E662 Morbid (severe) obesity with alveolar hypoventilation: Secondary | ICD-10-CM | POA: Diagnosis not present

## 2018-12-08 DIAGNOSIS — Z72821 Inadequate sleep hygiene: Secondary | ICD-10-CM

## 2018-12-08 DIAGNOSIS — E282 Polycystic ovarian syndrome: Secondary | ICD-10-CM

## 2018-12-08 DIAGNOSIS — R0683 Snoring: Secondary | ICD-10-CM | POA: Diagnosis not present

## 2018-12-08 DIAGNOSIS — F314 Bipolar disorder, current episode depressed, severe, without psychotic features: Secondary | ICD-10-CM

## 2018-12-08 DIAGNOSIS — G43709 Chronic migraine without aura, not intractable, without status migrainosus: Secondary | ICD-10-CM

## 2018-12-08 DIAGNOSIS — E66813 Obesity, class 3: Secondary | ICD-10-CM

## 2018-12-08 MED ORDER — CLONAZEPAM 0.5 MG PO TABS: 0.5000 mg | ORAL_TABLET | Freq: Two times a day (BID) | ORAL | 0 refills | Status: DC | PRN

## 2018-12-08 NOTE — Patient Instructions (Signed)
Please remember to try to maintain good sleep hygiene, which means: Keep a regular sleep and wake schedule, try not to exercise or have a meal within 2 hours of your bedtime, try to keep your bedroom conducive for sleep, that is, cool and dark, without light distractors such as an illuminated alarm clock, and refrain from watching TV right before sleep or in the middle of the night and do not keep the TV or radio on during the night. Also, try not to use or play on electronic devices at bedtime, such as your cell phone, tablet PC or laptop. If you like to read at bedtime on an electronic device, try to dim the background light as much as possible. Do not eat in the middle of the night.   We will request a sleep study.    We will look for leg twitching and snoring or sleep apnea.   For chronic insomnia, you are best followed by a psychiatrist and/or sleep psychologist.   We will call you with the sleep study results and make a follow up appointment if needed.    Screening for Sleep Apnea  Sleep apnea is a condition in which breathing pauses or becomes shallow during sleep. Sleep apnea screening is a test to determine if you are at risk for sleep apnea. The test is easy and only takes a few minutes. Your health care provider may ask you to have this test in preparation for surgery or as part of a physical exam. What are the symptoms of sleep apnea? Common symptoms of sleep apnea include:  Snoring.  Restless sleep.  Daytime sleepiness.  Pauses in breathing.  Choking during sleep.  Irritability.  Forgetfulness.  Trouble thinking clearly.  Depression.  Personality changes. Most people with sleep apnea are not aware that they have it. Why should I get screened? Getting screened for sleep apnea can help:  Ensure your safety. It is important for your health care providers to know whether or not you have sleep apnea, especially if you are having surgery or have other long-term  (chronic) health conditions.  Improve your health and allow you to get a better night's rest. Restful sleep can help you: ? Have more energy. ? Lose weight. ? Improve high blood pressure. ? Improve diabetes management. ? Prevent stroke. ? Prevent car accidents. How is screening done? Screening usually includes being asked a list of questions about your sleep quality. Some questions you may be asked include:  Do you snore?  Is your sleep restless?  Do you have daytime sleepiness?  Has a partner or spouse told you that you stop breathing during sleep?  Have you had trouble concentrating or memory loss? If your screening test is positive, you are at risk for the condition. Further testing may be needed to confirm a diagnosis of sleep apnea. Where to find more information You can find screening tools online or at your health care clinic. For more information about sleep apnea screening and healthy sleep, visit these websites:  Centers for Disease Control and Prevention: [employer]at[employer]pl  American Sleep Apnea Association: www.sleepapnea.org Contact a health care provider if:  You think that you may have sleep apnea. Summary  Sleep apnea screening can help determine if you are at risk for sleep apnea.  It is important for your health care providers to know whether or not you have sleep apnea, especially if you are having surgery or have other chronic health conditions.  You may be asked to take a  screening test for sleep apnea in preparation for surgery or as part of a physical exam. This information is not intended to replace advice given to you by your health care provider. Make sure you discuss any questions you have with your health care provider. Document Released: 11/01/2016 Document Revised: 11/01/2016 Document Reviewed: 11/01/2016 Elsevier Interactive Patient Education  2019 [employer].

## 2018-12-08 NOTE — Progress Notes (Signed)
Virtual Visit via Video Note  I connected with Christine [name] on 12/08/18 at  1:00 PM EDT by a video enabled telemedicine application and verified that I am speaking with the correct person using two identifiers.  Location: Patient: at her home  Provider: at her office   I discussed the limitations of evaluation and management by telemedicine and the availability of in person appointments. The patient expressed understanding and agreed to proceed.     [facility]   Provider:  Larey Seat, M D  Primary Care Physician:  Filiberto Pinks   Referring Provider: Jaynee Eagles, MD      HPI:  Christine [name] is a 43 y.o. female , seen here as in a referral from Dr [email or web address] [email or web address]   Chief complaint according to patient :   Sleep and medical history: Mrs. Kirt is a 80-year young female African-American with a history of morbid obesity, high blood pressure, polycystic ovarian syndrome, osteoarthritis, bipolar disorder with many hospitalizations in the past, the last in February 2020.  She also reports headaches and difficulty sleeping.  She reports that her migraines are worsening, she reports that these are associated with nausea, photosensitivity, blurred vision.  Headaches start with the right eye or behind the right eye with a pulsating pressure sensation.  She has about 16 headaches a month.  She had part previously maniac hypomanic and mixed episodes she has decreased her caffeine intake which she had increased before to help with headaches but was now able to reduce.  She reviewed her list of medications with me.  She was last seen in person on 09 September 2018 with a BMI of 58.24 constituting super obesity and a high risk for obesity hypoventilation.   Family medical and sleep history: empty   Social history: The patient is married she has young children that still lives at home,   Sleep habits: She reports her dinnertime is usually 7 PM and her nighttime medication are  taking at 9:30 PM sometimes she will fall asleep right after she takes the medication but interestingly she does not go to bed she sleeps on her sofa in her living room.  There she will wake up at 11 PM and still postpone going to bed often until midnight or later.  Between 1 and 2 AM she will finally fall asleep in her bed, in her bedroom.  She describes the bedroom is quiet and dark but not cool.  She states her husband likes it warmer and 43 F is not rarely found in her bedroom.  She sleeps on her side he uses 2 pillows and she sleeps on a flat nonadjustable bed.  She has 1 to possible breaks each night she snores loudly according to her husband and she wakes after each bathroom break for about 30 minutes before she is able to initiate sleep again.  She considers herself a light sleeper and she wakes up because the Make sounds, will request any kind of minor stimulus is reaching her.  She states that she has no palpitations, no pain and she is not dreaming vividly.  She rises at 630 after spontaneous arousal but she is not refreshed she often has morning headaches and headaches do not wake her she wakes with them.  She has a dry mouth in the morning and her insomnia component has gotten worse over the last months.  She is avoiding naps because she will not get nighttime sleep if she sleeps in daytime.  She does realize  that her sleep disorder is closely related to her mood.  However her husband has noted her to gasp and snort and there may be very well a sleep apnea-hypopnea-hypoventilation component present.  Review of Systems: Out of a complete 14 system review, the patient complains of only the following symptoms, and all other reviewed systems are negative. How likely are you to doze in the following situations: 0 = not likely, 1 = slight chance, 2 = moderate chance, 3 = high chance  Sitting and Reading? Watching Television? Sitting inactive in a public place (theater or meeting)? Lying down in  the afternoon when circumstances permit? Sitting and talking to someone? Sitting quietly after lunch without alcohol? In a car, while stopped for a few minutes in traffic? As a passenger in a car for an hour without a break?  Total = 6    Social History   Socioeconomic History  . Marital status: Married    Spouse name: Ether Goebel  . Number of children: 2  . Years of education: 2 years of grad school + bach & HS  . Highest education level: Bachelor's degree (e.g., BA, AB, BS)  Occupational History  . Occupation: disabled  Social Needs  . Financial resource strain: Somewhat hard  . Food insecurity:    Worry: Sometimes true    Inability: Sometimes true  . Transportation needs:    Medical: No    Non-medical: No  Tobacco Use  . Smoking status: Never Smoker  . Smokeless tobacco: Never Used  Substance and Sexual Activity  . Alcohol use: Yes    Comment: occasional, maybe once a month or so  . Drug use: No  . Sexual activity: Yes    Partners: Male    Birth control/protection: None  Lifestyle  . Physical activity:    Days per week: 0 days    Minutes per session: Not on file  . Stress: Rather much  Relationships  . Social connections:    Talks on phone: Never    Gets together: Never    Attends religious service: Never    Active member of club or organization: No    Attends meetings of clubs or organizations: Never    Relationship status: Not on file  . Intimate partner violence:    Fear of current or ex partner: No    Emotionally abused: No    Physically abused: No    Forced sexual activity: No  Other Topics Concern  . Not on file  Social History Narrative   Lives at home with her husband and daughters   Right handed   Caffeine: decreased intake, drinks rarely     Family History  Problem Relation Age of Onset  . Anxiety disorder Mother   . Depression Mother   . Cancer Mother        pancreatic   . High blood pressure Mother   . Hypertension Mother   .  Hyperlipidemia Mother   . Cancer Father        colon  . High blood pressure Father   . Diabetes Father   . Hypertension Father   . Hyperlipidemia Father   . Heart disease Father   . Stroke Father   . Kidney disease Father   . Sleep apnea Father   . Obesity Father   . Cancer Brother 6       leukemia, childhood  . High blood pressure Brother   . Migraines Neg Hx     Past Medical History:  Diagnosis Date  . Anemia   . Anxiety   . Arthritis    Hands, ankles  . Back pain   . Bipolar 1 disorder ([place])   . Chest pain   . Depression   . Depression   . Dyspnea   . Fibroids   . Fibromyalgia   . Headache   . High blood pressure   . History of blood transfusion 08/2017   [facility]  . History of hiatal hernia   . Infertility, female   . Joint pain   . Lactose intolerance   . Lower extremity edema   . Mental disorder   . Migraines   . Osteoarthritis   . PCOS (polycystic ovarian syndrome)   . Seasonal allergies   . Vitamin D deficiency     Past Surgical History:  Procedure Laterality Date  . CERVICAL POLYPECTOMY  01/15/2018   Procedure: CERVICAL POLYPECTOMY;  Surgeon: Arvella Nigh, MD;  Location: [facility];  Service: Gynecology;;  . COLONOSCOPY  2008   Normal  . DIAGNOSTIC LAPAROSCOPY  08/1999   dermoid cyst, RSO  . DILATION AND CURETTAGE OF UTERUS  05/2004   MAB  . DILATION AND CURETTAGE OF UTERUS  04/2015  . HYSTEROSCOPY W/D&C  07/22/2011   Procedure: DILATATION AND CURETTAGE /HYSTEROSCOPY;  Surgeon: Cyril Mourning, MD;  Location: [facility];  Service: Gynecology;;  . HYSTEROSCOPY W/D&C N/A 01/15/2018   Procedure: DILATATION AND CURETTAGE [place] WITH MYOSURE;  Surgeon: Arvella Nigh, MD;  Location: [facility];  Service: Gynecology;  Laterality: N/A;  . 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 amLODipine (NORVASC) 10 MG tablet Take 1 tablet (10 mg total) by mouth daily. 30 tablet 0  . ARIPiprazole  (ABILIFY) 15 MG tablet Take 1 tablet (15 mg total) by mouth daily. 30 tablet 0  . clonazePAM (KLONOPIN) 0.5 MG tablet Take 0.5 mg by mouth 2 (two) times daily as needed for anxiety.    [name] (AIMOVIG) 140 MG/ML SOAJ Inject 140 mg into the skin every 30 (thirty) days. 1 pen 11  . lamoTRIgine (LAMICTAL) 150 MG tablet Take 1 tablet (150 mg total) by mouth 2 (two) times daily. For mood 60 tablet 1  . lithium carbonate 300 MG capsule Take 2 capsule in and 3 capsule at bed time (Patient taking differently: Take 600-900 mg by mouth 2 (two) times daily with a meal. Take 600mg  in the morning and 900mg  at bed time) 150 capsule 1  . metFORMIN (GLUCOPHAGE) 500 MG tablet Take 1 tablet (500 mg total) by mouth daily with breakfast. 30 tablet 0  . metoprolol succinate (TOPROL-XL) 50 MG 24 hr tablet Take 50 mg by mouth daily. Take with or immediately following a meal.    . traMADol (ULTRAM) 50 MG tablet Take 1 tablet (50 mg total) by mouth every 8 (eight) hours as needed. 10 tablet 0  . Vitamin D, Ergocalciferol, (DRISDOL) 1.25 MG (50000 UT) CAPS capsule Take 1 capsule (50,000 Units total) by mouth every 7 (seven) days. 4 capsule 0   No current facility-administered medications for this visit.     Allergies as of 12/08/2018 - Review Complete 11/12/2018  Allergen Reaction Noted  . Penicillins Rash and Other (See Comments) 07/12/2011    Vitals: There were no vitals taken for this visit. Last Weight:  Wt Readings from Last 1 Encounters:  10/21/18 (!) 335 lb (152 kg)   WEX:HBZJI is no height or  weight on file to calculate BMI.     Last Height:   Ht Readings from Last 1 Encounters:  10/21/18 5\' 4"  (1.626 m)    Observation:   General: The patient is awake, alert and appears not in acute distress.   The patient is well groomed. Head: Normocephalic, atraumatic.  Neck is supple. Mallampati 4,  neck circumference:15. 25   Nasal airflow patent , . Retrognathia is seen. Patient used to wear a  dental liner.  without distended neck veins. Respiratory: can hold breath 19 seconds, normal nasal airflow.  Skin:  Without evidence of facial or hand/ finger edema, or rash Trunk: BMI is 55 plus .  The patient's posture is erect.  Neurologic exam : The patient is awake and alert, oriented to place and time.   Attention span & concentration ability appears normal.  Speech is fluent,  without  dysarthria, dysphonia or aphasia.  Mood and affect are appropriate.  Cranial nerves: no loss of taste or smell.  Pupils are equal - Extraocular movements  in vertical and horizontal planes intact  Facial motor strength is symmetric and tongue and uvula move midline. Shoulder shrug was symmetrical.   Motor exam:  Symmetric muscle bulk and ROM  in all extremities. Coordination:l without evidence of ataxia, dysmetria or tremor. Gait and station: Patient walks without assistive device.  Assessment and Plan: Morning headaches endorsed but not EDS, Epworth score was 6, night time sleep is very fragmented, not refreshing, restoring, and she snores.  Check for OSA.   Follow Up Instructions: HST ordered.   I will add an Insomnia information, for better sleep habits and sleep hygiene. Avoiding evening naps on the sofa, detracting form ability to sleep through the night. There is very likely apnea present, but also mood related hypervigilance with light sleep, easily aroused by noises and surrounding minor stimuli.   The patient is a high risk patient for obesity hypoventilation , a condition that may be evaluated in a capnographie or by arterial blood gases.  However, I will be able to evaluate for OSA first and follow with an attended study should the patient not respond to the initiated treatment.   The patient did not endorse hypersomnia and an Epworth score of only 6 out of 24 points.   Her insomnia is related to both , Mood and Sleep breathing.     I discussed the assessment and treatment plan  with the patient. The patient was provided an opportunity to ask questions and all were answered. The patient agreed with the plan and demonstrated an understanding of the instructions.   The patient was advised to call back or seek an in-person evaluation if the symptoms worsen or if the condition fails to improve as anticipated.  I provided over 30  minutes of non-face-to-face time during this encounter.  I reviewed the chart, her recent hopsitalization and long time complaint of obesity. I also noted that Tramadol, an opioid was prescribed to this patient, which may push her into hypoventilation.   Patient will follow up after HST with NP.     Larey Seat, MD 11/10/2498, 3:70 PM  Certified in Neurology by [employer] Certified in [place] by [facility] [address] [place], [place] [address]              Observations/Objective:

## 2018-12-09 ENCOUNTER — Ambulatory Visit (INDEPENDENT_AMBULATORY_CARE_PROVIDER_SITE_OTHER): Payer: Self-pay | Admitting: Family Medicine

## 2018-12-09 DIAGNOSIS — R079 Chest pain, unspecified: Secondary | ICD-10-CM | POA: Diagnosis not present

## 2018-12-09 DIAGNOSIS — R5383 Other fatigue: Secondary | ICD-10-CM | POA: Diagnosis not present

## 2018-12-09 DIAGNOSIS — R05 Cough: Secondary | ICD-10-CM | POA: Diagnosis not present

## 2018-12-16 ENCOUNTER — Encounter (INDEPENDENT_AMBULATORY_CARE_PROVIDER_SITE_OTHER): Payer: Self-pay | Admitting: Family Medicine

## 2018-12-16 ENCOUNTER — Ambulatory Visit (INDEPENDENT_AMBULATORY_CARE_PROVIDER_SITE_OTHER): Payer: Medicare Other | Admitting: Family Medicine

## 2018-12-16 ENCOUNTER — Other Ambulatory Visit: Payer: Self-pay

## 2018-12-16 DIAGNOSIS — R7303 Prediabetes: Secondary | ICD-10-CM | POA: Diagnosis not present

## 2018-12-16 DIAGNOSIS — Z6841 Body Mass Index (BMI) 40.0 and over, adult: Secondary | ICD-10-CM | POA: Diagnosis not present

## 2018-12-16 DIAGNOSIS — E559 Vitamin D deficiency, unspecified: Secondary | ICD-10-CM | POA: Diagnosis not present

## 2018-12-16 NOTE — Progress Notes (Signed)
Office: [phone]  /  Fax: [phone] TeleHealth Visit:  Christine [name] has verbally consented to this TeleHealth visit today. The patient is located at home, the provider is located at the [facility] office. The participants in this visit include the listed provider and patient. Christine [name] was unable to use realtime audiovisual technology today and the telehealth visit was conducted via telephone.   HPI:   Chief Complaint: OBESITY Christine [name] is here to discuss her progress with her obesity treatment plan. She is on the Category 3 plan and is following her eating plan approximately 25 % of the time. She states she is exercising 0 minutes 0 times per week. Christine [name] was sick during the past 3 weeks with concern of possible COVID-19 infection, she notes COVID test was negative. She is veering off the meal plan frequently, secondary to boredom and inability to be creative with the meal plan.  We were unable to weigh the patient today for this TeleHealth visit. She feels as if she has maintained her weight since her last visit. She has lost 2 lbs since starting treatment with [place].  Vitamin D Deficiency Christine [name] has a diagnosis of vitamin D deficiency. She is currently taking prescription Vit D. She notes fatigue and denies nausea, vomiting or muscle weakness.  Pre-Diabetes Christine [name] has a diagnosis of pre-diabetes based on her elevated Hgb A1c of 5.7 on initial labs and insulin of 19.5. She was informed this puts her at greater risk of developing diabetes. She denies GI side effects of metformin and continues to work on diet and exercise to decrease risk of diabetes.   ASSESSMENT AND PLAN:  Vitamin D deficiency - Plan: Vitamin D, Ergocalciferol, (DRISDOL) 1.25 MG (50000 UT) CAPS capsule  Prediabetes - Plan: metFORMIN (GLUCOPHAGE) 500 MG tablet  Class 3 severe obesity with serious comorbidity and body mass index (BMI) of 50.0 to 59.9 in adult, unspecified obesity type ([place])  PLAN:   Vitamin D Deficiency Christine [name] was informed that low vitamin D levels contributes to fatigue and are associated with obesity, breast, and colon cancer. Christine [name] agrees to continue taking prescription Vit D @50 ,000 IU every week #4 and we will refill for 1 month. She will follow up for routine testing of vitamin D, at least 2-3 times per year. She was informed of the risk of over-replacement of vitamin D and agrees to not increase her dose unless she discusses this with [place] first. Christine [name] agrees to follow up with our clinic in 2 weeks with Dr. Leafy Ro.  Pre-Diabetes Christine [name] will continue to work on weight loss, exercise, and decreasing simple carbohydrates in her diet to help decrease the risk of diabetes. We dicussed metformin including benefits and risks. She was informed that eating too many simple carbohydrates or too many calories at one sitting increases the likelihood of GI side effects. Christine [name] agrees to continue taking metformin 500 mg PO q AM #30 and we will refill for 1 month. Christine [name] agrees to follow up with our clinic in 2 weeks with Dr. Leafy Ro as directed to monitor her progress.  Obesity Christine [name] is currently in the action stage of change. As such, her goal is to continue with weight loss efforts She has agreed to keep a food journal with 450-600 calories and 40+ grams of protein at supper daily and follow the Pescatarian eating plan + 300 calories  Christine [name] has been instructed to work up to a goal of 150 minutes of combined cardio and strengthening exercise per week for weight loss  and overall health benefits. We discussed the following Behavioral Modification Strategies today: increasing lean protein intake, increasing vegetables and work on meal planning and easy cooking plans, keeping healthy foods in the home, and planning for success   Christine [name] has agreed to follow up with our clinic in 2 weeks with Dr. Leafy Ro. She was informed of the importance of frequent follow up visits to maximize her  success with intensive lifestyle modifications for her multiple health conditions.  ALLERGIES: Allergies  Allergen Reactions  . Penicillins Rash and Other (See Comments)    Has patient had a PCN reaction causing immediate rash, facial/tongue/throat swelling, SOB or lightheadedness with hypotension: Yes Has patient had a PCN reaction causing severe rash involving mucus membranes or skin necrosis: Unknown Has patient had a PCN reaction that required hospitalization: No Has patient had a PCN reaction occurring within the last 10 years: No If all of the above answers are "NO", then may proceed with Cephalosporin use.     MEDICATIONS: Current Outpatient Medications on File Prior to Visit  Medication Sig Dispense Refill  . amLODipine (NORVASC) 10 MG tablet Take 1 tablet (10 mg total) by mouth daily. 30 tablet 0  . ARIPiprazole (ABILIFY) 15 MG tablet Take 1 tablet (15 mg total) by mouth daily. 30 tablet 0  . clonazePAM (KLONOPIN) 0.5 MG tablet Take 1 tablet (0.5 mg total) by mouth 2 (two) times daily as needed for anxiety. 60 tablet 0  . Erenumab-aooe (AIMOVIG) 140 MG/ML SOAJ Inject 140 mg into the skin every 30 (thirty) days. 1 pen 11  . lamoTRIgine (LAMICTAL) 150 MG tablet Take 1 tablet (150 mg total) by mouth 2 (two) times daily. For mood 60 tablet 1  . lithium carbonate 300 MG capsule Take 2 capsule in and 3 capsule at bed time (Patient taking differently: Take 600-900 mg by mouth 2 (two) times daily with a meal. Take 600mg  in the morning and 900mg  at bed time) 150 capsule 1  . metFORMIN (GLUCOPHAGE) 500 MG tablet Take 1 tablet (500 mg total) by mouth daily with breakfast. 30 tablet 0  . metoprolol succinate (TOPROL-XL) 50 MG 24 hr tablet Take 50 mg by mouth daily. Take with or immediately following a meal.    . traMADol (ULTRAM) 50 MG tablet Take 1 tablet (50 mg total) by mouth every 8 (eight) hours as needed. 10 tablet 0  . Vitamin D, Ergocalciferol, (DRISDOL) 1.25 MG (50000 UT) CAPS capsule  Take 1 capsule (50,000 Units total) by mouth every 7 (seven) days. 4 capsule 0   No current facility-administered medications on file prior to visit.     PAST MEDICAL HISTORY: Past Medical History:  Diagnosis Date  . Anemia   . Anxiety   . Arthritis    Hands, ankles  . Back pain   . Bipolar 1 disorder ([place])   . Chest pain   . Depression   . Depression   . Dyspnea   . Fibroids   . Fibromyalgia   . Headache   . High blood pressure   . History of blood transfusion 08/2017   [facility]  . History of hiatal hernia   . Infertility, female   . Joint pain   . Lactose intolerance   . Lower extremity edema   . Mental disorder   . Migraines   . Osteoarthritis   . PCOS (polycystic ovarian syndrome)   . Seasonal allergies   . Vitamin D deficiency     PAST SURGICAL HISTORY: Past Surgical History:  Procedure Laterality Date  . CERVICAL POLYPECTOMY  01/15/2018   Procedure: CERVICAL POLYPECTOMY;  Surgeon: Arvella Nigh, MD;  Location: [facility];  Service: Gynecology;;  . COLONOSCOPY  2008   Normal  . DIAGNOSTIC LAPAROSCOPY  08/1999   dermoid cyst, RSO  . DILATION AND CURETTAGE OF UTERUS  05/2004   MAB  . DILATION AND CURETTAGE OF UTERUS  04/2015  . HYSTEROSCOPY W/D&C  07/22/2011   Procedure: DILATATION AND CURETTAGE /HYSTEROSCOPY;  Surgeon: Cyril Mourning, MD;  Location: [facility];  Service: Gynecology;;  . HYSTEROSCOPY W/D&C N/A 01/15/2018   Procedure: DILATATION AND CURETTAGE Pollyann Glen WITH MYOSURE;  Surgeon: Arvella Nigh, MD;  Location: [facility];  Service: Gynecology;  Laterality: N/A;  . 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 Smoking status: Never Smoker  . Smokeless tobacco: Never Used  Substance Use Topics  . Alcohol use: Yes    Comment: occasional, maybe once a month or so  . Drug use: No    FAMILY HISTORY: Family History  Problem Relation Age of Onset  . Anxiety disorder Mother   . Depression Mother   . Cancer  Mother        pancreatic   . High blood pressure Mother   . Hypertension Mother   . Hyperlipidemia Mother   . Cancer Father        colon  . High blood pressure Father   . Diabetes Father   . Hypertension Father   . Hyperlipidemia Father   . Heart disease Father   . Stroke Father   . Kidney disease Father   . Sleep apnea Father   . Obesity Father   . Cancer Brother 6       leukemia, childhood  . High blood pressure Brother   . Migraines Neg Hx     ROS: Review of Systems  Constitutional: Positive for malaise/fatigue. Negative for weight loss.  Gastrointestinal: Negative for nausea and vomiting.  Musculoskeletal:       Negative muscle weakness    PHYSICAL EXAM: Pt in no acute distress  RECENT LABS AND TESTS: BMET    Component Value Date/Time   NA 138 10/07/2018 1219   K 4.4 10/07/2018 1219   CL 104 10/07/2018 1219   CO2 21 10/07/2018 1219   GLUCOSE 78 10/07/2018 1219   GLUCOSE 94 09/07/2018 1718   BUN 10 10/07/2018 1219   CREATININE 0.77 10/07/2018 1219   CREATININE 0.88 03/19/2018 1051   CALCIUM 9.5 10/07/2018 1219   GFRNONAA 96 10/07/2018 1219   GFRNONAA >60 03/19/2018 1051   GFRAA 111 10/07/2018 1219   GFRAA >60 03/19/2018 1051   Lab Results  Component Value Date   HGBA1C 5.7 (H) 10/07/2018   Lab Results  Component Value Date   INSULIN 19.5 10/07/2018   CBC    Component Value Date/Time   WBC 12.0 (H) 09/07/2018 1718   RBC 4.37 09/07/2018 1718   HGB 10.1 (L) 09/07/2018 1718   HGB 12.6 03/19/2018 1051   HCT 36.5 09/07/2018 1718   PLT 404 (H) 09/07/2018 1718   PLT 266 03/19/2018 1051   MCV 83.5 09/07/2018 1718   MCH 23.1 (L) 09/07/2018 1718   MCHC 27.7 (L) 09/07/2018 1718   RDW 17.4 (H) 09/07/2018 1718   LYMPHSABS 1.9 08/08/2018 1312   MONOABS 1.0 08/08/2018 1312   EOSABS 0.1 08/08/2018 1312   BASOSABS 0.0 08/08/2018 1312   Iron/TIBC/Ferritin/ %Sat  Component Value Date/Time   IRON 52 03/19/2018 1051   TIBC 311 03/19/2018 1051    FERRITIN 30 03/19/2018 1051   IRONPCTSAT 17 (L) 03/19/2018 1051   Lipid Panel     Component Value Date/Time   CHOL 183 10/07/2018 1220   TRIG 70 10/07/2018 1220   HDL 38 (L) 10/07/2018 1220   LDLCALC 131 (H) 10/07/2018 1220   Hepatic Function Panel     Component Value Date/Time   PROT 7.0 10/07/2018 1219   ALBUMIN 4.2 10/07/2018 1219   AST 11 10/07/2018 1219   AST 8 (L) 03/19/2018 1051   ALT 13 10/07/2018 1219   ALT 12 03/19/2018 1051   ALKPHOS 65 10/07/2018 1219   BILITOT 0.4 10/07/2018 1219   BILITOT 0.4 03/19/2018 1051      Component Value Date/Time   TSH 1.260 10/07/2018 1219   TSH 1.460 04/28/2018 1130      I, Trixie Dredge, am acting as transcriptionist for Ilene Qua, MD  I have reviewed the above documentation for accuracy and completeness, and I agree with the above. - Ilene Qua, MD

## 2018-12-16 NOTE — Telephone Encounter (Signed)
Please advise 

## 2018-12-22 MED ORDER — VITAMIN D (ERGOCALCIFEROL) 1.25 MG (50000 UNIT) PO CAPS: 50000.0000 [IU] | ORAL_CAPSULE | ORAL | 0 refills | Status: DC

## 2018-12-22 MED ORDER — METFORMIN HCL 500 MG PO TABS: 500.0000 mg | ORAL_TABLET | Freq: Every day | ORAL | 0 refills | Status: DC

## 2018-12-31 ENCOUNTER — Encounter (INDEPENDENT_AMBULATORY_CARE_PROVIDER_SITE_OTHER): Payer: Self-pay | Admitting: Family Medicine

## 2018-12-31 ENCOUNTER — Ambulatory Visit (INDEPENDENT_AMBULATORY_CARE_PROVIDER_SITE_OTHER): Payer: Medicare Other | Admitting: Family Medicine

## 2018-12-31 ENCOUNTER — Other Ambulatory Visit: Payer: Self-pay

## 2018-12-31 DIAGNOSIS — F418 Other specified anxiety disorders: Secondary | ICD-10-CM

## 2018-12-31 DIAGNOSIS — Z6841 Body Mass Index (BMI) 40.0 and over, adult: Secondary | ICD-10-CM

## 2018-12-31 DIAGNOSIS — R7303 Prediabetes: Secondary | ICD-10-CM | POA: Diagnosis not present

## 2019-01-04 NOTE — Progress Notes (Signed)
Office: [phone]  /  Fax: [phone] TeleHealth Visit:  Christine [name] has verbally consented to this TeleHealth visit today. The patient is located at home, the provider is located at the [facility] office. The participants in this visit include the listed provider and patient. The visit was conducted today via doxy.me.  HPI:   Chief Complaint: OBESITY Christine [name] is here to discuss her progress with her obesity treatment plan. She is on the Pescatarian plan and keeping a food journal with 450 to 600 calories and 40+ grams of protein and is following her eating plan approximately 50 % of the time. She states she is exercising 0 minutes 0 times per week. Christine [name] has done well maintaining her weight over the last 2 weeks. She is doing well with increasing protein and vegetables, but has not been journaling. She is mostly portion control and making smarter choices.  We were unable to weigh the patient today for this TeleHealth visit. She feels as if she has maintained weight since her last visit. She has lost 2 lbs since starting treatment with [place].  Depression with Anxiety Christine [name] was diagnosed with bipolar disorder. She is stable and her mood is good. Christine [name] is doing a bit more stress eating, but she has done well keeping it from getting out of control. Her sleep is stable and she feels good about what she has accomplished. She has been working on behavior modification techniques to help reduce her emotional eating and has been somewhat successful.   Depression screen PHQ 2/9 10/07/2018  Decreased Interest 2  Down, Depressed, Hopeless 3  PHQ - 2 Score 5  Altered sleeping 2  Tired, decreased energy 3  Change in appetite 1  Feeling bad or failure about yourself  3  Trouble concentrating 2  Moving slowly or fidgety/restless 1  Suicidal thoughts 1  PHQ-9 Score 18  Difficult doing work/chores Extremely dIfficult  Some encounter information is confidential and restricted. Go to  Review Flowsheets activity to see all data.  Some recent data might be hidden   ASSESSMENT AND PLAN:  Depression with anxiety  Class 3 severe obesity with serious comorbidity and body mass index (BMI) of 50.0 to 59.9 in adult, unspecified obesity type ([place])  PLAN:  Depression with Anxiety We discussed cognitive behavior modification techniques today to help [name] deal with her emotional eating and depression. She has agreed to continue to take her medications. Support and encouragement were offered and she agreed to follow up as directed in 2 weeks.  I spent > than 50% of the 15 minute visit on counseling as documented in the note.  Obesity Christine [name] is currently in the action stage of change. As such, her goal is to continue with weight loss efforts. She has agreed to follow the Pescatarian plan. Christine [name] has been instructed to work up to a goal of 150 minutes of combined cardio and strengthening exercise per week for weight loss and overall health benefits. We discussed the following Behavioral Modification Strategies today: emotional eating strategies and keeping healthy foods in the home.  Christine [name] has agreed to follow up with our clinic in 2 weeks. She was informed of the importance of frequent follow up visits to maximize her success with intensive lifestyle modifications for her multiple health conditions.  ALLERGIES: Allergies  Allergen Reactions  . Penicillins Rash and Other (See Comments)    Has patient had a PCN reaction causing immediate rash, facial/tongue/throat swelling, SOB or lightheadedness with hypotension: Yes Has patient  had a PCN reaction causing severe rash involving mucus membranes or skin necrosis: Unknown Has patient had a PCN reaction that required hospitalization: No Has patient had a PCN reaction occurring within the last 10 years: No If all of the above answers are "NO", then may proceed with Cephalosporin use.     MEDICATIONS: Current Outpatient  Medications on File Prior to Visit  Medication Sig Dispense Refill  . amLODipine (NORVASC) 10 MG tablet Take 1 tablet (10 mg total) by mouth daily. 30 tablet 0  . ARIPiprazole (ABILIFY) 15 MG tablet Take 1 tablet (15 mg total) by mouth daily. 30 tablet 0  . clonazePAM (KLONOPIN) 0.5 MG tablet Take 1 tablet (0.5 mg total) by mouth 2 (two) times daily as needed for anxiety. 60 tablet 0  . Erenumab-aooe (AIMOVIG) 140 MG/ML SOAJ Inject 140 mg into the skin every 30 (thirty) days. 1 pen 11  . lamoTRIgine (LAMICTAL) 150 MG tablet Take 1 tablet (150 mg total) by mouth 2 (two) times daily. For mood 60 tablet 1  . lithium carbonate 300 MG capsule Take 2 capsule in and 3 capsule at bed time (Patient taking differently: Take 600-900 mg by mouth 2 (two) times daily with a meal. Take 600mg  in the morning and 900mg  at bed time) 150 capsule 1  . metFORMIN (GLUCOPHAGE) 500 MG tablet Take 1 tablet (500 mg total) by mouth daily with breakfast. 30 tablet 0  . metoprolol succinate (TOPROL-XL) 50 MG 24 hr tablet Take 50 mg by mouth daily. Take with or immediately following a meal.    . traMADol (ULTRAM) 50 MG tablet Take 1 tablet (50 mg total) by mouth every 8 (eight) hours as needed. 10 tablet 0  . Vitamin D, Ergocalciferol, (DRISDOL) 1.25 MG (50000 UT) CAPS capsule Take 1 capsule (50,000 Units total) by mouth every 7 (seven) days. 4 capsule 0   No current facility-administered medications on file prior to visit.     PAST MEDICAL HISTORY: Past Medical History:  Diagnosis Date  . Anemia   . Anxiety   . Arthritis    Hands, ankles  . Back pain   . Bipolar 1 disorder ([place])   . Chest pain   . Depression   . Depression   . Dyspnea   . Fibroids   . Fibromyalgia   . Headache   . High blood pressure   . History of blood transfusion 08/2017   [facility]  . History of hiatal hernia   . Infertility, female   . Joint pain   . Lactose intolerance   . Lower extremity edema   . Mental disorder   . Migraines   .  Osteoarthritis   . PCOS (polycystic ovarian syndrome)   . Seasonal allergies   . Vitamin D deficiency     PAST SURGICAL HISTORY: Past Surgical History:  Procedure Laterality Date  . CERVICAL POLYPECTOMY  01/15/2018   Procedure: CERVICAL POLYPECTOMY;  Surgeon: Arvella Nigh, MD;  Location: [facility];  Service: Gynecology;;  . COLONOSCOPY  2008   Normal  . DIAGNOSTIC LAPAROSCOPY  08/1999   dermoid cyst, RSO  . DILATION AND CURETTAGE OF UTERUS  05/2004   MAB  . DILATION AND CURETTAGE OF UTERUS  04/2015  . HYSTEROSCOPY W/D&C  07/22/2011   Procedure: DILATATION AND CURETTAGE /HYSTEROSCOPY;  Surgeon: Cyril Mourning, MD;  Location: [facility];  Service: Gynecology;;  . HYSTEROSCOPY W/D&C N/A 01/15/2018   Procedure: DILATATION AND CURETTAGE [place] WITH MYOSURE;  Surgeon: Arvella Nigh, MD;  Location: [facility];  Service: Gynecology;  Laterality: N/A;  . 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 Smoking status: Never Smoker  . Smokeless tobacco: Never Used  Substance Use Topics  . Alcohol use: Yes    Comment: occasional, maybe once a month or so  . Drug use: No    FAMILY HISTORY: Family History  Problem Relation Age of Onset  . Anxiety disorder Mother   . Depression Mother   . Cancer Mother        pancreatic   . High blood pressure Mother   . Hypertension Mother   . Hyperlipidemia Mother   . Cancer Father        colon  . High blood pressure Father   . Diabetes Father   . Hypertension Father   . Hyperlipidemia Father   . Heart disease Father   . Stroke Father   . Kidney disease Father   . Sleep apnea Father   . Obesity Father   . Cancer Brother 6       leukemia, childhood  . High blood pressure Brother   . Migraines Neg Hx     ROS: Review of Systems  Psychiatric/Behavioral: Positive for depression. The patient is nervous/anxious.     PHYSICAL EXAM: Pt in no acute distress  RECENT LABS AND TESTS: BMET     Component Value Date/Time   NA 138 10/07/2018 1219   K 4.4 10/07/2018 1219   CL 104 10/07/2018 1219   CO2 21 10/07/2018 1219   GLUCOSE 78 10/07/2018 1219   GLUCOSE 94 09/07/2018 1718   BUN 10 10/07/2018 1219   CREATININE 0.77 10/07/2018 1219   CREATININE 0.88 03/19/2018 1051   CALCIUM 9.5 10/07/2018 1219   GFRNONAA 96 10/07/2018 1219   GFRNONAA >60 03/19/2018 1051   GFRAA 111 10/07/2018 1219   GFRAA >60 03/19/2018 1051   Lab Results  Component Value Date   HGBA1C 5.7 (H) 10/07/2018   Lab Results  Component Value Date   INSULIN 19.5 10/07/2018   CBC    Component Value Date/Time   WBC 12.0 (H) 09/07/2018 1718   RBC 4.37 09/07/2018 1718   HGB 10.1 (L) 09/07/2018 1718   HGB 12.6 03/19/2018 1051   HCT 36.5 09/07/2018 1718   PLT 404 (H) 09/07/2018 1718   PLT 266 03/19/2018 1051   MCV 83.5 09/07/2018 1718   MCH 23.1 (L) 09/07/2018 1718   MCHC 27.7 (L) 09/07/2018 1718   RDW 17.4 (H) 09/07/2018 1718   LYMPHSABS 1.9 08/08/2018 1312   MONOABS 1.0 08/08/2018 1312   EOSABS 0.1 08/08/2018 1312   BASOSABS 0.0 08/08/2018 1312   Iron/TIBC/Ferritin/ %Sat    Component Value Date/Time   IRON 52 03/19/2018 1051   TIBC 311 03/19/2018 1051   FERRITIN 30 03/19/2018 1051   IRONPCTSAT 17 (L) 03/19/2018 1051   Lipid Panel     Component Value Date/Time   CHOL 183 10/07/2018 1220   TRIG 70 10/07/2018 1220   HDL 38 (L) 10/07/2018 1220   LDLCALC 131 (H) 10/07/2018 1220   Hepatic Function Panel     Component Value Date/Time   PROT 7.0 10/07/2018 1219   ALBUMIN 4.2 10/07/2018 1219   AST 11 10/07/2018 1219   AST 8 (L) 03/19/2018 1051   ALT 13 10/07/2018 1219   ALT 12 03/19/2018 1051   ALKPHOS 65 10/07/2018 1219   BILITOT 0.4 10/07/2018 1219  BILITOT 0.4 03/19/2018 1051      Component Value Date/Time   TSH 1.260 10/07/2018 1219   TSH 1.460 04/28/2018 1130   Results for ANMARIE, FUKUSHIMA (MRN [id number]) as of 01/04/2019 12:07  Ref. Range 10/07/2018 12:19  Vitamin D, 25-Hydroxy  Latest Ref Range: 30.0 - 100.0 ng/mL 7.0 (L)     I, Marcille Blanco, CMA, am acting as transcriptionist for Starlyn Skeans, MD I have reviewed the above documentation for accuracy and completeness, and I agree with the above. -Dennard Nip, MD

## 2019-01-06 ENCOUNTER — Telehealth: Payer: Self-pay

## 2019-01-06 NOTE — Telephone Encounter (Signed)
We have attempted to call the patient two times to schedule sleep study.  Patient has been unavailable at the phone numbers we have on file and has not returned our calls.  At this point we will send a letter asking patient to please contact the sleep lab to schedule their sleep study.  If patient calls back we will schedule them for their sleep study. 

## 2019-01-07 ENCOUNTER — Ambulatory Visit (HOSPITAL_COMMUNITY): Payer: BC Managed Care – PPO | Admitting: Psychiatry

## 2019-01-07 NOTE — Progress Notes (Signed)
This encounter was created in error - please disregard.

## 2019-01-18 ENCOUNTER — Encounter (INDEPENDENT_AMBULATORY_CARE_PROVIDER_SITE_OTHER): Payer: Self-pay | Admitting: Family Medicine

## 2019-01-18 ENCOUNTER — Other Ambulatory Visit: Payer: Self-pay

## 2019-01-18 ENCOUNTER — Ambulatory Visit (INDEPENDENT_AMBULATORY_CARE_PROVIDER_SITE_OTHER): Payer: Medicare Other | Admitting: Family Medicine

## 2019-01-18 DIAGNOSIS — E559 Vitamin D deficiency, unspecified: Secondary | ICD-10-CM | POA: Diagnosis not present

## 2019-01-18 DIAGNOSIS — R7303 Prediabetes: Secondary | ICD-10-CM | POA: Diagnosis not present

## 2019-01-18 DIAGNOSIS — Z6841 Body Mass Index (BMI) 40.0 and over, adult: Secondary | ICD-10-CM

## 2019-01-18 DIAGNOSIS — E66813 Obesity, class 3: Secondary | ICD-10-CM

## 2019-01-18 MED ORDER — METFORMIN HCL 500 MG PO TABS: 500.0000 mg | ORAL_TABLET | Freq: Two times a day (BID) | ORAL | 0 refills | Status: DC

## 2019-01-18 MED ORDER — VITAMIN D (ERGOCALCIFEROL) 1.25 MG (50000 UNIT) PO CAPS: 50000.0000 [IU] | ORAL_CAPSULE | ORAL | 0 refills | Status: DC

## 2019-01-18 NOTE — Progress Notes (Signed)
Office: [phone]  /  Fax: [phone] TeleHealth Visit:  Christine [name] has verbally consented to this TeleHealth visit today. The patient is located at home, the provider is located at the [employer] [facility] office. The participants in this visit include the listed provider and patient. The visit was conducted today via doxy.me.  HPI:   Chief Complaint: OBESITY Christine [name] is here to discuss her progress with her obesity treatment plan. She is on the  follow our protein rich pescatarian plan and is following her eating plan approximately 50 % of the time. She states she is exercising by doing general aerobic exercised for 15 minutes 2 times per week. Christine [name] was changed to journaling and feels she is doing better with this as she likes the freedom this gives her. She is struggling with night time snacking especially when she sleeps poorly.  We were unable to weigh the patient today for this TeleHealth visit. She feels as if she has maintained weight since her last visit. She has lost 2 lbs since starting treatment with [place].  Vitamin D deficiency Christine [name] has a diagnosis of vitamin D deficiency. She is currently taking vit D and denies nausea, vomiting or muscle weakness. Her vitamin D level is not yet at goal.   Pre-Diabetes Christine [name] has a diagnosis of prediabetes based on her elevated HgA1c and was informed this puts her at greater risk of developing diabetes. She is taking metformin currently and continues to work on diet and exercise to decrease risk of diabetes. She denies nausea or hypoglycemia. She notes a decrease in daytime polyphagia on Metformin but an increase in evening to the point she often wakes up hungry and gets a snack. She takes her Metformin in the morning.     ASSESSMENT AND PLAN:  Vitamin D deficiency - Plan: Vitamin D, Ergocalciferol, (DRISDOL) 1.25 MG (50000 UT) CAPS capsule  Prediabetes - Plan: metFORMIN (GLUCOPHAGE) 500 MG tablet  Class 3 severe obesity with  serious comorbidity and body mass index (BMI) of 50.0 to 59.9 in adult, unspecified obesity type ([place])  PLAN: Vitamin D Deficiency Christine [name] was informed that low vitamin D levels contributes to fatigue and are associated with obesity, breast, and colon cancer. She agrees to continue to take prescription Vit D @50 ,000 IU every week #4 with no refills and will follow up for routine testing of vitamin D, at least 2-3 times per year. She was informed of the risk of over-replacement of vitamin D and agrees to not increase her dose unless she discusses this with [place] first. Agrees to follow up with our clinic as directed.   Pre-Diabetes Christine [name] will continue to work on weight loss, exercise, and decreasing simple carbohydrates in her diet to help decrease the risk of diabetes. We dicussed metformin including benefits and risks. She was informed that eating too many simple carbohydrates or too many calories at one sitting increases the likelihood of GI side effects. Christine [name] agrees to increase Metformin to 500 mg BID #60 with no refills. Christine [name] agreed to follow up with [place] as directed to monitor her progress.  Obesity Christine [name] is currently in the action stage of change. As such, her goal is to continue with weight loss efforts She has agreed to keep a food journal with 1600 calories and 90+ g of protein daily. Christine [name] has been instructed to work up to a goal of 150 minutes of combined cardio and strengthening exercise per week for weight loss and overall health benefits. We discussed the following  Behavioral Modification Stratagies today: increasing lean protein intake, decreasing simple carbohydrates  and work on meal planning and easy cooking plans  Christine [name] has agreed to follow up with our clinic in 3 weeks. She was informed of the importance of frequent follow up visits to maximize her success with intensive lifestyle modifications for her multiple health conditions.  ALLERGIES: Allergies  Allergen Reactions  .  Penicillins Rash and Other (See Comments)    Has patient had a PCN reaction causing immediate rash, facial/tongue/throat swelling, SOB or lightheadedness with hypotension: Yes Has patient had a PCN reaction causing severe rash involving mucus membranes or skin necrosis: Unknown Has patient had a PCN reaction that required hospitalization: No Has patient had a PCN reaction occurring within the last 10 years: No If all of the above answers are "NO", then may proceed with Cephalosporin use.     MEDICATIONS: Current Outpatient Medications on File Prior to Visit  Medication Sig Dispense Refill  . amLODipine (NORVASC) 10 MG tablet Take 1 tablet (10 mg total) by mouth daily. 30 tablet 0  . ARIPiprazole (ABILIFY) 15 MG tablet Take 1 tablet (15 mg total) by mouth daily. 30 tablet 0  . clonazePAM (KLONOPIN) 0.5 MG tablet Take 1 tablet (0.5 mg total) by mouth 2 (two) times daily as needed for anxiety. 60 tablet 0  . Erenumab-aooe (AIMOVIG) 140 MG/ML SOAJ Inject 140 mg into the skin every 30 (thirty) days. 1 pen 11  . lamoTRIgine (LAMICTAL) 150 MG tablet Take 1 tablet (150 mg total) by mouth 2 (two) times daily. For mood 60 tablet 1  . lithium carbonate 300 MG capsule Take 2 capsule in and 3 capsule at bed time (Patient taking differently: Take 600-900 mg by mouth 2 (two) times daily with a meal. Take 600mg  in the morning and 900mg  at bed time) 150 capsule 1  . metoprolol succinate (TOPROL-XL) 50 MG 24 hr tablet Take 50 mg by mouth daily. Take with or immediately following a meal.    . traMADol (ULTRAM) 50 MG tablet Take 1 tablet (50 mg total) by mouth every 8 (eight) hours as needed. 10 tablet 0   No current facility-administered medications on file prior to visit.     PAST MEDICAL HISTORY: Past Medical History:  Diagnosis Date  . Anemia   . Anxiety   . Arthritis    Hands, ankles  . Back pain   . Bipolar 1 disorder ([place])   . Chest pain   . Depression   . Depression   . Dyspnea   . Fibroids    . Fibromyalgia   . Headache   . High blood pressure   . History of blood transfusion 08/2017   [facility]  . History of hiatal hernia   . Infertility, female   . Joint pain   . Lactose intolerance   . Lower extremity edema   . Mental disorder   . Migraines   . Osteoarthritis   . PCOS (polycystic ovarian syndrome)   . Seasonal allergies   . Vitamin D deficiency     PAST SURGICAL HISTORY: Past Surgical History:  Procedure Laterality Date  . CERVICAL POLYPECTOMY  01/15/2018   Procedure: CERVICAL POLYPECTOMY;  Surgeon: Arvella Nigh, MD;  Location: [facility];  Service: Gynecology;;  . COLONOSCOPY  2008   Normal  . DIAGNOSTIC LAPAROSCOPY  08/1999   dermoid cyst, RSO  . DILATION AND CURETTAGE OF UTERUS  05/2004   MAB  . DILATION AND CURETTAGE OF UTERUS  04/2015  .  HYSTEROSCOPY W/D&C  07/22/2011   Procedure: DILATATION AND CURETTAGE /HYSTEROSCOPY;  Surgeon: Cyril Mourning, MD;  Location: [facility];  Service: Gynecology;;  . HYSTEROSCOPY W/D&C N/A 01/15/2018   Procedure: DILATATION AND CURETTAGE [place] WITH MYOSURE;  Surgeon: Arvella Nigh, MD;  Location: [facility];  Service: Gynecology;  Laterality: N/A;  . 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 Smoking status: Never Smoker  . Smokeless tobacco: Never Used  Substance Use Topics  . Alcohol use: Yes    Comment: occasional, maybe once a month or so  . Drug use: No    FAMILY HISTORY: Family History  Problem Relation Age of Onset  . Anxiety disorder Mother   . Depression Mother   . Cancer Mother        pancreatic   . High blood pressure Mother   . Hypertension Mother   . Hyperlipidemia Mother   . Cancer Father        colon  . High blood pressure Father   . Diabetes Father   . Hypertension Father   . Hyperlipidemia Father   . Heart disease Father   . Stroke Father   . Kidney disease Father   . Sleep apnea Father   . Obesity Father   . Cancer Brother 6        leukemia, childhood  . High blood pressure Brother   . Migraines Neg Hx     ROS: Review of Systems  Gastrointestinal: Negative for nausea and vomiting.  Musculoskeletal:       Negative for muscle weakness  Endo/Heme/Allergies:       Negative for hypoglycemia Positive for evening polyphagia     PHYSICAL EXAM: Pt in no acute distress  RECENT LABS AND TESTS: BMET    Component Value Date/Time   NA 138 10/07/2018 1219   K 4.4 10/07/2018 1219   CL 104 10/07/2018 1219   CO2 21 10/07/2018 1219   GLUCOSE 78 10/07/2018 1219   GLUCOSE 94 09/07/2018 1718   BUN 10 10/07/2018 1219   CREATININE 0.77 10/07/2018 1219   CREATININE 0.88 03/19/2018 1051   CALCIUM 9.5 10/07/2018 1219   GFRNONAA 96 10/07/2018 1219   GFRNONAA >60 03/19/2018 1051   GFRAA 111 10/07/2018 1219   GFRAA >60 03/19/2018 1051   Lab Results  Component Value Date   HGBA1C 5.7 (H) 10/07/2018   Lab Results  Component Value Date   INSULIN 19.5 10/07/2018   CBC    Component Value Date/Time   WBC 12.0 (H) 09/07/2018 1718   RBC 4.37 09/07/2018 1718   HGB 10.1 (L) 09/07/2018 1718   HGB 12.6 03/19/2018 1051   HCT 36.5 09/07/2018 1718   PLT 404 (H) 09/07/2018 1718   PLT 266 03/19/2018 1051   MCV 83.5 09/07/2018 1718   MCH 23.1 (L) 09/07/2018 1718   MCHC 27.7 (L) 09/07/2018 1718   RDW 17.4 (H) 09/07/2018 1718   LYMPHSABS 1.9 08/08/2018 1312   MONOABS 1.0 08/08/2018 1312   EOSABS 0.1 08/08/2018 1312   BASOSABS 0.0 08/08/2018 1312   Iron/TIBC/Ferritin/ %Sat    Component Value Date/Time   IRON 52 03/19/2018 1051   TIBC 311 03/19/2018 1051   FERRITIN 30 03/19/2018 1051   IRONPCTSAT 17 (L) 03/19/2018 1051   Lipid Panel     Component Value Date/Time   CHOL 183 10/07/2018 1220   TRIG 70 10/07/2018 1220   HDL 38 (L) 10/07/2018 1220  LDLCALC 131 (H) 10/07/2018 1220   Hepatic Function Panel     Component Value Date/Time   PROT 7.0 10/07/2018 1219   ALBUMIN 4.2 10/07/2018 1219   AST 11 10/07/2018 1219    AST 8 (L) 03/19/2018 1051   ALT 13 10/07/2018 1219   ALT 12 03/19/2018 1051   ALKPHOS 65 10/07/2018 1219   BILITOT 0.4 10/07/2018 1219   BILITOT 0.4 03/19/2018 1051      Component Value Date/Time   TSH 1.260 10/07/2018 1219   TSH 1.460 04/28/2018 1130   Results for JAKIYA, BOOKBINDER (MRN [id number]) as of 01/18/2019 19:01  Ref. Range 10/07/2018 12:19  Vitamin D, 25-Hydroxy Latest Ref Range: 30.0 - 100.0 ng/mL 7.0 (L)     I, Renee Ramus, am acting as Location manager for Dennard Nip, MD  I have reviewed the above documentation for accuracy and completeness, and I agree with the above. -Dennard Nip, MD

## 2019-01-19 ENCOUNTER — Ambulatory Visit (INDEPENDENT_AMBULATORY_CARE_PROVIDER_SITE_OTHER): Payer: Medicare Other | Admitting: Psychiatry

## 2019-01-19 ENCOUNTER — Other Ambulatory Visit: Payer: Self-pay

## 2019-01-19 ENCOUNTER — Encounter (HOSPITAL_COMMUNITY): Payer: Self-pay | Admitting: Psychiatry

## 2019-01-19 DIAGNOSIS — F3162 Bipolar disorder, current episode mixed, moderate: Secondary | ICD-10-CM

## 2019-01-19 DIAGNOSIS — F411 Generalized anxiety disorder: Secondary | ICD-10-CM

## 2019-01-19 MED ORDER — CLONAZEPAM 0.5 MG PO TABS: 0.5000 mg | ORAL_TABLET | Freq: Every day | ORAL | 0 refills | Status: DC | PRN

## 2019-01-19 MED ORDER — LAMOTRIGINE 150 MG PO TABS: 150.0000 mg | ORAL_TABLET | Freq: Two times a day (BID) | ORAL | 2 refills | Status: DC

## 2019-01-19 MED ORDER — LITHIUM CARBONATE 300 MG PO CAPS: ORAL_CAPSULE | ORAL | 2 refills | Status: DC

## 2019-01-19 MED ORDER — ARIPIPRAZOLE 15 MG PO TABS: 15.0000 mg | ORAL_TABLET | Freq: Every day | ORAL | 2 refills | Status: DC

## 2019-01-19 NOTE — Progress Notes (Signed)
Virtual Visit via Telephone Note  I connected with Christine [name] on 01/19/19 at 10:20 AM EDT by telephone and verified that I am speaking with the correct person using two identifiers.   I discussed the limitations, risks, security and privacy concerns of performing an evaluation and management service by telephone and the availability of in person appointments. I also discussed with the patient that there may be a patient responsible charge related to this service. The patient expressed understanding and agreed to proceed.   History of Present Illness: Patient was evaluated by phone session.  She is doing much better since compliant with Abilify and lithium.  Not she can afford the Abilify since her Social Security approved and she has now [employer] and Medicare.  She admitted some stress due to COVID-19 but overall she denies any irritability, anger, mood swing or any hallucination.  She also mentioned that her husband noticed improvement in her mood and she is sleeping better.  She lives with her husband and 40 and 8 years old.  She had a good support from her husband.  Recently she saw neurology for her headaches and she is watching her diet and cut down her caffeine.  She takes Klonopin only as needed.  She also started weight loss therapy and hoping to lose some weight which can help her headaches and energy level.  She reported no tremors shakes or any EPS.  She denies drinking or using any illegal substances.  She like to continue her current medication.    Past Psychiatric History:Reviewed. Patienthad a history of depression since age 78. She took antidepressant that madeher manic. She sawDr. [name] and tried tried numerous medicationwhich includesRisperdal, Seroquel, Abilify, lithium, Prozac, Paxil, Zoloft, Lexapro, Celexa, Effexor and Klonopin.She hadhistory of mania and impulsive behavior. We have tried [name]. She has 3 psychiatric hospitalization.[facility] and  intensive outpatient program.Denies any history of suicidal attempt. Her last hospitalization was in November 2017.   Psychiatric Specialty Exam: Physical Exam  ROS  There were no vitals taken for this visit.There is no height or weight on file to calculate BMI.  General Appearance: NA  Eye Contact:  NA  Speech:  Clear and Coherent and fast  Volume:  Normal  Mood:  Euthymic  Affect:  NA  Thought Process:  Descriptions of Associations: Intact  Orientation:  Full (Time, Place, and Person)  Thought Content:  WDL  Suicidal Thoughts:  No  Homicidal Thoughts:  No  Memory:  Immediate;   Good Recent;   Fair Remote;   Fair  Judgement:  Good  Insight:  Good  Psychomotor Activity:  NA  Concentration:  Concentration: Fair and Attention Span: Fair  Recall:  Good  Fund of Knowledge:  Good  Language:  Good  Akathisia:  No  Handed:  Right  AIMS (if indicated):     Assets:  Communication Skills Desire for Improvement Housing Resilience Social Support  ADL's:  Intact  Cognition:  WNL  Sleep:   improved      Assessment and Plan: Bipolar disorder type I.  Generalized anxiety disorder.  I reviewed her current medication, records from neurology and review her notes.  She started weight loss counseling and hoping to have lose some weight.  Her mood is much better since she is compliant with medication and able to afford Abilify.  I will continue lithium 600 mg in the morning and 900 mg at bedtime, Lamictal 300 mg daily, Abilify 15 mg at bedtime.  She takes Klonopin 0.5  mg as needed for severe anxiety.  I encouraged to keep appointment with [facility] for therapy.  Discussed medication side effects and benefits.  We will do lithium level on her next appointment in house.  Discussed safety concern that anytime having active suicidal thoughts or homicidal thought that she need to call 911 of the local emergency room.  Follow-up in 3 months.  Follow Up Instructions:    I discussed the  assessment and treatment plan with the patient. The patient was provided an opportunity to ask questions and all were answered. The patient agreed with the plan and demonstrated an understanding of the instructions.   The patient was advised to call back or seek an in-person evaluation if the symptoms worsen or if the condition fails to improve as anticipated.  I provided 20 minutes of non-face-to-face time during this encounter.   Kathlee Nations, MD

## 2019-01-30 ENCOUNTER — Emergency Department (HOSPITAL_COMMUNITY): Payer: Medicare Other

## 2019-01-30 ENCOUNTER — Emergency Department (HOSPITAL_COMMUNITY)
Admission: EM | Admit: 2019-01-30 | Discharge: 2019-01-30 | Disposition: A | Discharge status: Home / Self Care | Payer: Medicare Other | Attending: Emergency Medicine | Admitting: Emergency Medicine

## 2019-01-30 ENCOUNTER — Encounter (HOSPITAL_COMMUNITY): Payer: Self-pay | Admitting: Emergency Medicine

## 2019-01-30 ENCOUNTER — Other Ambulatory Visit: Payer: Self-pay

## 2019-01-30 DIAGNOSIS — R0602 Shortness of breath: Secondary | ICD-10-CM | POA: Diagnosis not present

## 2019-01-30 DIAGNOSIS — Z79899 Other long term (current) drug therapy: Secondary | ICD-10-CM | POA: Diagnosis not present

## 2019-01-30 DIAGNOSIS — I270 Primary pulmonary hypertension: Secondary | ICD-10-CM | POA: Insufficient documentation

## 2019-01-30 DIAGNOSIS — R0789 Other chest pain: Secondary | ICD-10-CM

## 2019-01-30 DIAGNOSIS — R05 Cough: Secondary | ICD-10-CM | POA: Diagnosis not present

## 2019-01-30 DIAGNOSIS — R079 Chest pain, unspecified: Secondary | ICD-10-CM | POA: Diagnosis not present

## 2019-01-30 DIAGNOSIS — Z7984 Long term (current) use of oral hypoglycemic drugs: Secondary | ICD-10-CM | POA: Insufficient documentation

## 2019-01-30 DIAGNOSIS — I272 Pulmonary hypertension, unspecified: Secondary | ICD-10-CM

## 2019-01-30 LAB — BASIC METABOLIC PANEL
Anion gap: 10 (ref 5–15)
BUN: 14 mg/dL (ref 6–20)
CO2: 20 mmol/L — ABNORMAL LOW (ref 22–32)
Calcium: 9.3 mg/dL (ref 8.9–10.3)
Chloride: 107 mmol/L (ref 98–111)
Creatinine, Ser: 0.69 mg/dL (ref 0.44–1.00)
GFR calc Af Amer: >60 mL/min (ref >60)
GFR calc non Af Amer: >60 mL/min (ref >60)
Glucose, Bld: 93 mg/dL (ref 70–99)
Potassium: 3.8 mmol/L (ref 3.5–5.1)
Sodium: 137 mmol/L (ref 135–145)

## 2019-01-30 LAB — CBC
HCT: 28.9 % — ABNORMAL LOW (ref 36.0–46.0)
Hemoglobin: 7.8 g/dL — ABNORMAL LOW (ref 12.0–15.0)
MCH: 19.1 pg — ABNORMAL LOW (ref 26.0–34.0)
MCHC: 27 g/dL — ABNORMAL LOW (ref 30.0–36.0)
MCV: 70.7 fL — ABNORMAL LOW (ref 80.0–100.0)
Platelets: 356 10*3/uL (ref 150–400)
RBC: 4.09 MIL/uL (ref 3.87–5.11)
RDW: 20.9 % — ABNORMAL HIGH (ref 11.5–15.5)
WBC: 9.6 10*3/uL (ref 4.0–10.5)
nRBC: 0.2 % (ref 0.0–0.2)

## 2019-01-30 LAB — D-DIMER, QUANTITATIVE: D-Dimer, Quant: 0.56 ug/mL-FEU — ABNORMAL HIGH (ref 0.00–0.50)

## 2019-01-30 LAB — TROPONIN I (HIGH SENSITIVITY): Troponin I (High Sensitivity): 2 ng/L (ref <18)

## 2019-01-30 LAB — I-STAT BETA HCG BLOOD, ED (MC, WL, AP ONLY): I-stat hCG, quantitative: <5 m[IU]/mL (ref <5)

## 2019-01-30 LAB — CBG MONITORING, ED: Glucose-Capillary: 88 mg/dL (ref 70–99)

## 2019-01-30 MED ORDER — SODIUM CHLORIDE (PF) 0.9 % IJ SOLN
INTRAMUSCULAR | Status: AC
  Filled 2019-01-30: qty 50

## 2019-01-30 MED ORDER — ASPIRIN 81 MG PO CHEW
324.0000 mg | CHEWABLE_TABLET | Freq: Once | ORAL | Status: AC
  Administered 2019-01-30: 324 mg via ORAL
  Filled 2019-01-30: qty 4

## 2019-01-30 MED ORDER — NAPROXEN 375 MG PO TABS: 375.0000 mg | ORAL_TABLET | Freq: Two times a day (BID) | ORAL | 0 refills | Status: DC

## 2019-01-30 MED ORDER — IOHEXOL 350 MG/ML SOLN
100.0000 mL | Freq: Once | INTRAVENOUS | Status: AC | PRN
  Administered 2019-01-30: 17:00:00 100 mL via INTRAVENOUS

## 2019-01-30 NOTE — Discharge Instructions (Signed)
We saw you in the ER for All the results in the ER are normal, labs and imaging. We are not sure what is causing your symptoms. The workup in the ER is not complete, and is limited to screening for life threatening and emergent conditions only, so please see primary doctor or the cardiologist for further evaluation of the possible finding of pulmonary hypertension.

## 2019-01-30 NOTE — ED Provider Notes (Signed)
[facility] DEPT Provider Note   CSN: [id number] Arrival date & time: 01/30/19  1330     History   Chief Complaint Chief Complaint  Patient presents with   Chest Pain   Shortness of Breath    HPI Christine [name] is a 43 y.o. female.  HPI: A 43 year old patient with a history of hypertension and obesity presents for evaluation of chest pain. Initial onset of pain was approximately 3-6 hours ago. The patient's chest pain is described as heaviness/pressure/tightness and is worse with exertion. The patient's chest pain is middle- or left-sided, is not well-localized, is not sharp and does radiate to the arms/jaw/neck. The patient does not complain of nausea and denies diaphoresis. The patient has no history of stroke, has no history of peripheral artery disease, has not smoked in the past 90 days, denies any history of treated diabetes, has no relevant family history of coronary artery disease (first degree relative at less than age 70) and has no history of hypercholesterolemia.   Hx of pcos and pain is worse with deep inspiration. Pt has no hx of PE, DVT and denies any exogenous hormone (testosterone / estrogen) use, long distance travels or surgery in the past 6 weeks, active cancer, recent immobilization.   HPI  Past Medical History:  Diagnosis Date   Anemia    Anxiety    Arthritis    Hands, ankles   Back pain    Bipolar 1 disorder ([place])    Chest pain    Depression    Depression    Dyspnea    Fibroids    Fibromyalgia    Headache    High blood pressure    History of blood transfusion 08/2017   [facility]   History of hiatal hernia    Infertility, female    Joint pain    Lactose intolerance    Lower extremity edema    Mental disorder    Migraines    Osteoarthritis    PCOS (polycystic ovarian syndrome)    Seasonal allergies    Vitamin D deficiency     Patient Active Problem List   Diagnosis Date Noted    Inadequate sleep hygiene 12/08/2018   Psychophysiological insomnia 12/08/2018   Class 3 obesity with alveolar hypoventilation, serious comorbidity, and body mass index (BMI) of 50.0 to 59.9 in adult ([place]) 12/08/2018   Snoring 12/08/2018   Chronic migraine without aura without status migrainosus, not intractable 09/21/2018   Bipolar 1 disorder, depressed, severe ([place]) 09/08/2018   Endometrium, polyp 01/15/2018    Class: Present on Admission   Fibroids, submucosal 01/15/2018    Class: Present on Admission   Iron deficiency anemia due to chronic blood loss 09/16/2017   Menorrhagia with regular cycle    Symptomatic anemia 08/28/2017   Acute blood loss anemia 08/28/2017   Bipolar disorder ([place]) 08/28/2017   Anxiety 08/28/2017   Depression with anxiety 08/28/2017   Fibroids 08/28/2017   PCOS (polycystic ovarian syndrome) 08/28/2017   Fever 08/28/2017   HTN (hypertension) 08/28/2017   Morbid obesity ([place]) 12/04/2015   Pregnancy with history of uterine myomectomy 04/07/2015   History of right oophorectomy 02/21/2015   History of recurrent miscarriages 02/01/2015   Vitamin D deficiency 02/01/2015    Past Surgical History:  Procedure Laterality Date   CERVICAL POLYPECTOMY  01/15/2018   Procedure: CERVICAL POLYPECTOMY;  Surgeon: Arvella Nigh, MD;  Location: [facility];  Service: Gynecology;;   COLONOSCOPY  2008   Normal   DIAGNOSTIC LAPAROSCOPY  08/1999   dermoid cyst, RSO   DILATION AND CURETTAGE OF UTERUS  05/2004   MAB   DILATION AND CURETTAGE OF UTERUS  04/2015   HYSTEROSCOPY W/D&C  07/22/2011   Procedure: DILATATION AND CURETTAGE /HYSTEROSCOPY;  Surgeon: Cyril Mourning, MD;  Location: [facility];  Service: Gynecology;;   HYSTEROSCOPY W/D&C N/A 01/15/2018   Procedure: DILATATION AND CURETTAGE [place] WITH MYOSURE;  Surgeon: Arvella Nigh, MD;  Location: [facility];  Service: Gynecology;  Laterality: N/A;   oopherectomy  Right 2001   dermoid tumor      OB History    Gravida  4   Para      Term      Preterm      AB  4   Living        SAB  4   TAB      Ectopic      Multiple      Live Births               Home Medications    Prior to Admission medications   Medication Sig Start Date End Date Taking? Authorizing Provider  amLODipine (NORVASC) 10 MG tablet Take 1 tablet (10 mg total) by mouth daily. 08/29/17   Eugenie Filler, MD  ARIPiprazole (ABILIFY) 15 MG tablet Take 1 tablet (15 mg total) by mouth daily. 01/19/19 01/19/20  [name], [name], MD  clonazePAM (KLONOPIN) 0.5 MG tablet Take 1 tablet (0.5 mg total) by mouth daily as needed for anxiety. 01/19/19   [name], [name], MD  Erenumab-aooe (AIMOVIG) 140 MG/ML SOAJ Inject 140 mg into the skin every 30 (thirty) days. 09/21/18   Melvenia Beam, MD  lamoTRIgine (LAMICTAL) 150 MG tablet Take 1 tablet (150 mg total) by mouth 2 (two) times daily. For mood 01/19/19   [name], [name], MD  lithium carbonate 300 MG capsule Take 2 capsule in AM and 3 capsule at bed time 01/19/19   [name], [name], MD  metFORMIN (GLUCOPHAGE) 500 MG tablet Take 1 tablet (500 mg total) by mouth 2 (two) times daily with a meal. 01/18/19   [name], MD  metoprolol succinate (TOPROL-XL) 50 MG 24 hr tablet Take 50 mg by mouth daily. Take with or immediately following a meal.    [provider]  naproxen (NAPROSYN) 375 MG tablet Take 1 tablet (375 mg total) by mouth 2 (two) times daily. 01/30/19   Varney Biles, MD  traMADol (ULTRAM) 50 MG tablet Take 1 tablet (50 mg total) by mouth every 8 (eight) hours as needed. 11/12/18   Virgel Manifold, MD  Vitamin D, Ergocalciferol, (DRISDOL) 1.25 MG (50000 UT) CAPS capsule Take 1 capsule (50,000 Units total) by mouth every 7 (seven) days. 01/18/19   Starlyn Skeans, MD    Family History Family History  Problem Relation Age of Onset   Anxiety disorder Mother    Depression Mother    Cancer Mother        pancreatic    High blood pressure  Mother    Hypertension Mother    Hyperlipidemia Mother    Cancer Father        colon   High blood pressure Father    Diabetes Father    Hypertension Father    Hyperlipidemia Father    Heart disease Father    Stroke Father    Kidney disease Father    Sleep apnea Father    Obesity Father    Cancer Brother 19  leukemia, childhood   High blood pressure Brother    Migraines Neg Hx     Social History Social History   Tobacco Use   Smoking status: Never Smoker   Smokeless tobacco: Never Used  Substance Use Topics   Alcohol use: Yes    Comment: occasional, maybe once a month or so   Drug use: No     Allergies   Penicillins   Review of Systems Review of Systems  Constitutional: Positive for activity change.  Respiratory: Positive for shortness of breath. Negative for cough.   Cardiovascular: Positive for chest pain.  Gastrointestinal: Negative for nausea and vomiting.  Neurological: Negative for dizziness.  All other systems reviewed and are negative.    Physical Exam Updated Vital Signs BP (!) 150/91    Pulse 80    Temp 99 F (37.2 C)    Resp (!) 24    LMP 01/06/2019    SpO2 99%   Physical Exam Vitals signs and nursing note reviewed.  Constitutional:      Appearance: She is well-developed.  HENT:     Head: Normocephalic and atraumatic.  Neck:     Musculoskeletal: Normal range of motion and neck supple.  Cardiovascular:     Rate and Rhythm: Normal rate.  Pulmonary:     Effort: Pulmonary effort is normal. Tachypnea present.     Breath sounds: Normal breath sounds. No decreased breath sounds, wheezing or rhonchi.  Abdominal:     General: Bowel sounds are normal.  Musculoskeletal:     Right lower leg: No edema.     Left lower leg: No edema.  Skin:    General: Skin is warm and dry.  Neurological:     Mental Status: She is alert and oriented to person, place, and time.      ED Treatments / Results  Labs (all labs ordered are  listed, but only abnormal results are displayed) Labs Reviewed  BASIC METABOLIC PANEL - Abnormal; Notable for the following components:      Result Value   CO2 20 (*)    All other components within normal limits  CBC - Abnormal; Notable for the following components:   Hemoglobin 7.8 (*)    HCT 28.9 (*)    MCV 70.7 (*)    MCH 19.1 (*)    MCHC 27.0 (*)    RDW 20.9 (*)    All other components within normal limits  D-DIMER, QUANTITATIVE (NOT AT [facility]) - Abnormal; Notable for the following components:   D-Dimer, Quant 0.56 (*)    All other components within normal limits  TROPONIN I (HIGH SENSITIVITY)  CBG MONITORING, ED  I-STAT BETA HCG BLOOD, ED ([facility], [facility], [facility] ONLY)    EKG EKG Interpretation  Date/Time:  Saturday January 30 2019 13:57:44 EDT Ventricular Rate:  99 PR Interval:    QRS Duration: 86 QT Interval:  336 QTC Calculation: 432 R Axis:   59 Text Interpretation:  Sinus rhythm Low voltage, precordial leads No acute changes No significant change since last tracing Confirmed by Varney Biles [phone]) on 01/30/2019 2:19:52 PM   Radiology Ct Angio Chest Pe W And/or Wo Contrast  Result Date: 01/30/2019 CLINICAL DATA:  Chest pain and shortness of breath EXAM: CT ANGIOGRAPHY CHEST WITH CONTRAST TECHNIQUE: Multidetector CT imaging of the chest was performed using the standard protocol during bolus administration of intravenous contrast. Multiplanar CT image reconstructions and MIPs were obtained to evaluate the vascular anatomy. CONTRAST:  133mL OMNIPAQUE IOHEXOL 350 MG/ML SOLN  COMPARISON:  Chest radiograph January 30, 2019 FINDINGS: Cardiovascular: There is no demonstrable pulmonary embolus. There is no thoracic aortic aneurysm or dissection. Visualized great vessels appear unremarkable. The right innominate and left common carotid arteries arise as a common trunk, an anatomic variant. There is no pericardial effusion or pericardial thickening. The main pulmonary outflow tract is enlarged  measuring 3.9 cm diameter. Mediastinum/Nodes: Thyroid appears normal. There are subcentimeter axillary lymph nodes bilaterally. No adenopathy by size criteria is evident in the thoracic region. No esophageal lesions are appreciable. Lungs/Pleura: There are areas of atelectatic change in the lung bases. There is no evident edema or consolidation. No pleural effusion or pleural thickening evident. Upper Abdomen: Visualized upper abdominal structures appear unremarkable. Musculoskeletal: There are no blastic or lytic bone lesions. No chest wall lesions are demonstrable. Review of the MIP images confirms the above findings. IMPRESSION: 1. No demonstrable pulmonary embolus. No thoracic aortic aneurysm or dissection. 2. Prominence of the main pulmonary outflow tract, a finding indicative of pulmonary arterial hypertension. 3. Lower lobe atelectatic change bilaterally. There may be a degree of underlying small airways obstructive disease. No frank consolidation or edema evident. 4.  No demonstrable pulmonary embolus. Electronically Signed   By: Lowella Grip III M.D.   On: 01/30/2019 17:30   Dg Chest Portable 1 View  Result Date: 01/30/2019 CLINICAL DATA:  Chest pain with shortness of breath and cough EXAM: PORTABLE CHEST 1 VIEW COMPARISON:  April 13, 2018 FINDINGS: Lungs are clear. Heart is mildly enlarged with pulmonary vascularity normal. No adenopathy. No bone lesions. No pneumothorax. IMPRESSION: Mild cardiac enlargement.  No edema or consolidation. Electronically Signed   By: Lowella Grip III M.D.   On: 01/30/2019 15:06    Procedures Procedures (including critical care time)  Medications Ordered in ED Medications  sodium chloride (PF) 0.9 % injection (has no administration in time range)  aspirin chewable tablet 324 mg (324 mg Oral Given 01/30/19 1511)  iohexol (OMNIPAQUE) 350 MG/ML injection 100 mL (100 mLs Intravenous Contrast Given 01/30/19 1705)     Initial Impression / Assessment and  Plan / ED Course  I have reviewed the triage vital signs and the nursing notes.  Pertinent labs & imaging results that were available during my care of the patient were reviewed by me and considered in my medical decision making (see chart for details).     HEAR Score: 15  43 year old female comes in with chief complaint of chest pain and shortness of breath.  She reports that she has been having shortness of breath for the last several days.  Pt has no hx of PE, DVT and denies any exogenous hormone (testosterone / estrogen) use, long distance travels or surgery in the past 6 weeks, active cancer, recent immobilization.  Patient however does have PCOS history. The chest pain is nonspecific.  Besides PE, ACS is also in the differential.  Hear score is 3.  Initial troponin is less than the cutoff and her pain has been present for more than 3 hours, therefore no repeat indicated.  ACS ruled out in the ER.  7:06 PM The patient appears reasonably screened and/or stabilized for discharge and I doubt any other medical condition or other [facility] requiring further screening, evaluation, or treatment in the ED at this time prior to discharge.   Results from the ER workup discussed with the patient face to face and all questions answered to the best of my ability. The patient is safe for discharge with strict return  precautions.   Final Clinical Impressions(s) / ED Diagnoses   Final diagnoses:  Pulmonary hypertension ([place])  Atypical chest pain    ED Discharge Orders         Ordered    naproxen (NAPROSYN) 375 MG tablet  2 times daily     01/30/19 1833           Varney Biles, MD 01/30/19 1906

## 2019-01-30 NOTE — ED Notes (Signed)
Bed: WA12 Expected date:  Expected time:  Means of arrival:  Comments: Neg Pressure 

## 2019-01-30 NOTE — ED Triage Notes (Signed)
Patient c/o intermittent stabbing chest pain to left chest radiating to left arm x3 days with SOB on exertion and cough. Reports travel to [place] last week.

## 2019-01-31 ENCOUNTER — Encounter (INDEPENDENT_AMBULATORY_CARE_PROVIDER_SITE_OTHER): Payer: Self-pay | Admitting: Family Medicine

## 2019-02-01 NOTE — Telephone Encounter (Signed)
Please advise 

## 2019-02-01 NOTE — Telephone Encounter (Signed)
Please place a cardiology referral for her

## 2019-02-03 ENCOUNTER — Other Ambulatory Visit (INDEPENDENT_AMBULATORY_CARE_PROVIDER_SITE_OTHER): Payer: Self-pay

## 2019-02-03 DIAGNOSIS — R0789 Other chest pain: Secondary | ICD-10-CM

## 2019-02-08 DIAGNOSIS — D259 Leiomyoma of uterus, unspecified: Secondary | ICD-10-CM | POA: Diagnosis not present

## 2019-02-08 DIAGNOSIS — R079 Chest pain, unspecified: Secondary | ICD-10-CM | POA: Diagnosis not present

## 2019-02-08 DIAGNOSIS — D509 Iron deficiency anemia, unspecified: Secondary | ICD-10-CM | POA: Diagnosis not present

## 2019-02-08 DIAGNOSIS — I10 Essential (primary) hypertension: Secondary | ICD-10-CM | POA: Diagnosis not present

## 2019-02-09 ENCOUNTER — Encounter (INDEPENDENT_AMBULATORY_CARE_PROVIDER_SITE_OTHER): Payer: Self-pay | Admitting: Family Medicine

## 2019-02-09 ENCOUNTER — Other Ambulatory Visit: Payer: Self-pay

## 2019-02-09 ENCOUNTER — Ambulatory Visit (INDEPENDENT_AMBULATORY_CARE_PROVIDER_SITE_OTHER): Payer: Medicare Other | Admitting: Family Medicine

## 2019-02-09 VITALS — BP 124/72 | HR 96 | Temp 98.8°F | Ht 64.0 in | Wt 323.0 lb

## 2019-02-09 DIAGNOSIS — Z9189 Other specified personal risk factors, not elsewhere classified: Secondary | ICD-10-CM

## 2019-02-09 DIAGNOSIS — R7303 Prediabetes: Secondary | ICD-10-CM | POA: Diagnosis not present

## 2019-02-09 DIAGNOSIS — Z6841 Body Mass Index (BMI) 40.0 and over, adult: Secondary | ICD-10-CM

## 2019-02-09 DIAGNOSIS — R9389 Abnormal findings on diagnostic imaging of other specified body structures: Secondary | ICD-10-CM

## 2019-02-09 NOTE — Progress Notes (Signed)
Office: [phone]  /  Fax: [phone]   HPI:   Chief Complaint: OBESITY Christine [name] is here to discuss her progress with her obesity treatment plan. She is keeping a food journal with 1600 calories and 90+ grams of protein and is following her eating plan approximately 50% of the time. She states she is exercising 0 minutes 0 times per week. Christine [name] has lost 12 lbs since her last in-office visit 4 months ago due to COVID-19 isolation. She was changed to journaling and likes this freedom much more than the Category plan. Hunger is controlled and she is doing well meeting her goals. Her weight is (!) 323 lb (146.5 kg) today and has had a weight loss of 12 pounds over a period of 4 months since her last in-office visit. She has lost 14 lbs since starting treatment with [place].  Pre-Diabetes Christine [name] has a diagnosis of prediabetes based on her elevated Hgb A1c and was informed this puts her at greater risk of developing diabetes. She increased her metformin to BID and had some mild GI upset for 2-3 days, but this has resolved. She continues to work on diet and exercise to decrease risk of diabetes. She denies nausea or hypoglycemia.  Abnormal CT [name] went to the Emergency Room and had a CT which ruled out PE but showed possible pulmonary hypertension. I sent in a referral for Cardiology on 07/01 but the patient has not yet been contacted. She denies chest pain or shortness of breath at rest.  At risk for cardiovascular disease Christine [name] is at a higher than average risk for cardiovascular disease due to obesity. She currently denies any chest pain.  ASSESSMENT AND PLAN:  Prediabetes  Abnormal CT scan  At risk for heart disease  Class 3 severe obesity with serious comorbidity and body mass index (BMI) of 50.0 to 59.9 in adult, unspecified obesity type ([place])  PLAN:  Pre-Diabetes Christine [name] will continue to work on weight loss, exercise, and decreasing simple carbohydrates in her diet to help  decrease the risk of diabetes. We dicussed metformin including benefits and risks. She was informed that eating too many simple carbohydrates or too many calories at one sitting increases the likelihood of GI side effects. Christine [name] will continue metformin, diet and weight loss. She will follow-up with [place] as directed to monitor her progress.  Abnormal CT A second referral was sent to Cardiology for evaluation. Christine [name] will follow-up with our clinic in 2 weeks.  Cardiovascular risk counseling Christine [name] was given extended (30 minutes) coronary artery disease prevention counseling today. She is 43 y.o. female and has risk factors for heart disease including obesity. We discussed intensive lifestyle modifications today with an emphasis on specific weight loss instructions and strategies. Pt was also informed of the importance of increasing exercise and decreasing saturated fats to help prevent heart disease.  Obesity Christine [name] is currently in the action stage of change. As such, her goal is to continue with weight loss efforts. She has agreed to keep a food journal with 1600 calories and 90+ grams of protein. Christine [name] has been instructed to start considering exercise for weight loss and overall health benefits. We discussed the following Behavioral Modification Strategies today: increasing lean protein intake, decreasing simple carbohydrates, work on meal planning and easy cooking plans.  Christine [name] has agreed to follow-up with our clinic in 2 weeks. She was informed of the importance of frequent follow-up visits to maximize her success with intensive lifestyle modifications for her multiple health conditions.  ALLERGIES: Allergies  Allergen Reactions  . Penicillins Rash and Other (See Comments)    Has patient had a PCN reaction causing immediate rash, facial/tongue/throat swelling, SOB or lightheadedness with hypotension: Yes Has patient had a PCN reaction causing severe rash involving mucus membranes or skin  necrosis: Unknown Has patient had a PCN reaction that required hospitalization: No Has patient had a PCN reaction occurring within the last 10 years: No If all of the above answers are "NO", then may proceed with Cephalosporin use.     MEDICATIONS: Current Outpatient Medications on File Prior to Visit  Medication Sig Dispense Refill  . amLODipine (NORVASC) 10 MG tablet Take 1 tablet (10 mg total) by mouth daily. 30 tablet 0  . ARIPiprazole (ABILIFY) 15 MG tablet Take 1 tablet (15 mg total) by mouth daily. 30 tablet 2  . clindamycin (CLEOCIN) 150 MG capsule Take by mouth 4 (four) times daily.    . clonazePAM (KLONOPIN) 0.5 MG tablet Take 1 tablet (0.5 mg total) by mouth daily as needed for anxiety. 30 tablet 0  . Erenumab-aooe (AIMOVIG) 140 MG/ML SOAJ Inject 140 mg into the skin every 30 (thirty) days. 1 pen 11  . lamoTRIgine (LAMICTAL) 150 MG tablet Take 1 tablet (150 mg total) by mouth 2 (two) times daily. For mood 60 tablet 2  . lithium carbonate 300 MG capsule Take 2 capsule in AM and 3 capsule at bed time 150 capsule 2  . metFORMIN (GLUCOPHAGE) 500 MG tablet Take 1 tablet (500 mg total) by mouth 2 (two) times daily with a meal. 60 tablet 0  . metoprolol succinate (TOPROL-XL) 50 MG 24 hr tablet Take 50 mg by mouth daily. Take with or immediately following a meal.    . naproxen (NAPROSYN) 375 MG tablet Take 1 tablet (375 mg total) by mouth 2 (two) times daily. 20 tablet 0  . traMADol (ULTRAM) 50 MG tablet Take 1 tablet (50 mg total) by mouth every 8 (eight) hours as needed. 10 tablet 0  . Vitamin D, Ergocalciferol, (DRISDOL) 1.25 MG (50000 UT) CAPS capsule Take 1 capsule (50,000 Units total) by mouth every 7 (seven) days. 4 capsule 0   No current facility-administered medications on file prior to visit.     PAST MEDICAL HISTORY: Past Medical History:  Diagnosis Date  . Anemia   . Anxiety   . Arthritis    Hands, ankles  . Back pain   . Bipolar 1 disorder ([place])   . Chest pain   .  Depression   . Depression   . Dyspnea   . Fibroids   . Fibromyalgia   . Headache   . High blood pressure   . History of blood transfusion 08/2017   [facility]  . History of hiatal hernia   . Infertility, female   . Joint pain   . Lactose intolerance   . Lower extremity edema   . Mental disorder   . Migraines   . Osteoarthritis   . PCOS (polycystic ovarian syndrome)   . Seasonal allergies   . Vitamin D deficiency     PAST SURGICAL HISTORY: Past Surgical History:  Procedure Laterality Date  . CERVICAL POLYPECTOMY  01/15/2018   Procedure: CERVICAL POLYPECTOMY;  Surgeon: Arvella Nigh, MD;  Location: [facility];  Service: Gynecology;;  . COLONOSCOPY  2008   Normal  . DIAGNOSTIC LAPAROSCOPY  08/1999   dermoid cyst, [facility]  . DILATION AND CURETTAGE OF UTERUS  05/2004   [facility]  . DILATION AND CURETTAGE OF UTERUS  04/2015  .  HYSTEROSCOPY W/D&C  07/22/2011   Procedure: DILATATION AND CURETTAGE /HYSTEROSCOPY;  Surgeon: Cyril Mourning, MD;  Location: [facility];  Service: Gynecology;;  . HYSTEROSCOPY W/D&C N/A 01/15/2018   Procedure: DILATATION AND CURETTAGE [place] WITH MYOSURE;  Surgeon: Arvella Nigh, MD;  Location: [facility];  Service: Gynecology;  Laterality: N/A;  . 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 Smoking status: Never Smoker  . Smokeless tobacco: Never Used  Substance Use Topics  . Alcohol use: Yes    Comment: occasional, maybe once a month or so  . Drug use: No    FAMILY HISTORY: Family History  Problem Relation Age of Onset  . Anxiety disorder Mother   . Depression Mother   . Cancer Mother        pancreatic   . High blood pressure Mother   . Hypertension Mother   . Hyperlipidemia Mother   . Cancer Father        colon  . High blood pressure Father   . Diabetes Father   . Hypertension Father   . Hyperlipidemia Father   . Heart disease Father   . Stroke Father   . Kidney disease Father   . Sleep apnea Father    . Obesity Father   . Cancer Brother 6       leukemia, childhood  . High blood pressure Brother   . Migraines Neg Hx    ROS: Review of Systems  Respiratory: Negative for shortness of breath (at rest).   Cardiovascular: Negative for chest pain.   PHYSICAL EXAM: Blood pressure 124/72, pulse 96, temperature 98.8 F (37.1 C), temperature source Oral, height 5\' 4"  (1.626 m), weight (!) 323 lb (146.5 kg), SpO2 98 %. Body mass index is 55.44 kg/m. Physical Exam Vitals signs reviewed.  Constitutional:      Appearance: Normal appearance. She is obese.  Cardiovascular:     Rate and Rhythm: Normal rate.     Pulses: Normal pulses.  Pulmonary:     Effort: Pulmonary effort is normal.     Breath sounds: Normal breath sounds.  Musculoskeletal: Normal range of motion.  Skin:    General: Skin is warm and dry.  Neurological:     Mental Status: She is alert and oriented to person, place, and time.  Psychiatric:        Behavior: Behavior normal.   RECENT LABS AND TESTS: BMET    Component Value Date/Time   NA 137 01/30/2019 1519   NA 138 10/07/2018 1219   K 3.8 01/30/2019 1519   CL 107 01/30/2019 1519   CO2 20 (L) 01/30/2019 1519   GLUCOSE 93 01/30/2019 1519   BUN 14 01/30/2019 1519   BUN 10 10/07/2018 1219   CREATININE 0.69 01/30/2019 1519   CREATININE 0.88 03/19/2018 1051   CALCIUM 9.3 01/30/2019 1519   GFRNONAA >60 01/30/2019 1519   GFRNONAA >60 03/19/2018 1051   GFRAA >60 01/30/2019 1519   GFRAA >60 03/19/2018 1051   Lab Results  Component Value Date   HGBA1C 5.7 (H) 10/07/2018   Lab Results  Component Value Date   INSULIN 19.5 10/07/2018   CBC    Component Value Date/Time   WBC 9.6 01/30/2019 1519   RBC 4.09 01/30/2019 1519   HGB 7.8 (L) 01/30/2019 1519   HGB 12.6 03/19/2018 1051   HCT 28.9 (L) 01/30/2019 1519   PLT 356 01/30/2019 1519   PLT 266 03/19/2018  1051   MCV 70.7 (L) 01/30/2019 1519   MCH 19.1 (L) 01/30/2019 1519   MCHC 27.0 (L) 01/30/2019 1519   RDW  20.9 (H) 01/30/2019 1519   LYMPHSABS 1.9 08/08/2018 1312   MONOABS 1.0 08/08/2018 1312   EOSABS 0.1 08/08/2018 1312   BASOSABS 0.0 08/08/2018 1312   Iron/TIBC/Ferritin/ %Sat    Component Value Date/Time   IRON 52 03/19/2018 1051   TIBC 311 03/19/2018 1051   FERRITIN 30 03/19/2018 1051   IRONPCTSAT 17 (L) 03/19/2018 1051   Lipid Panel     Component Value Date/Time   CHOL 183 10/07/2018 1220   TRIG 70 10/07/2018 1220   HDL 38 (L) 10/07/2018 1220   LDLCALC 131 (H) 10/07/2018 1220   Hepatic Function Panel     Component Value Date/Time   PROT 7.0 10/07/2018 1219   ALBUMIN 4.2 10/07/2018 1219   AST 11 10/07/2018 1219   AST 8 (L) 03/19/2018 1051   ALT 13 10/07/2018 1219   ALT 12 03/19/2018 1051   ALKPHOS 65 10/07/2018 1219   BILITOT 0.4 10/07/2018 1219   BILITOT 0.4 03/19/2018 1051      Component Value Date/Time   TSH 1.260 10/07/2018 1219   TSH 1.460 04/28/2018 1130   Results for NARISSA, BEAUFORT (MRN [id number]) as of 02/09/2019 14:55  Ref. Range 10/07/2018 12:19  Vitamin D, 25-Hydroxy Latest Ref Range: 30.0 - 100.0 ng/mL 7.0 (L)   OBESITY BEHAVIORAL INTERVENTION VISIT  Today's visit was #8  Starting weight: 337 lbs Starting date: 10/07/2018 Today's weight: 323 lbs Today's date: 02/09/2019 Total lbs lost to date: 14    02/09/2019  Height 5\' 4"  (1.626 m)  Weight 323 lb (146.5 kg) (A)  BMI (Calculated) 55.42  BLOOD PRESSURE - SYSTOLIC 174  BLOOD PRESSURE - DIASTOLIC 72   Body Fat % 94.4 %   ASK: We discussed the diagnosis of obesity with Louis Meckel today and [name] agreed to give [place] permission to discuss obesity behavioral modification therapy today.  ASSESS: Christine [name] has the diagnosis of obesity and her BMI today is 55.4. Christine [name] is in the action stage of change.   ADVISE: Christine [name] was educated on the multiple health risks of obesity as well as the benefit of weight loss to improve her health. She was advised of the need for long term treatment and the  importance of lifestyle modifications to improve her current health and to decrease her risk of future health problems.  AGREE: Multiple dietary modification options and treatment options were discussed and  [name] agreed to follow the recommendations documented in the above note.  ARRANGE: Christine [name] was educated on the importance of frequent visits to treat obesity as outlined per CMS and USPSTF guidelines and agreed to schedule her next follow up appointment today.  I, Michaelene Song, am acting as Location manager for Dennard Nip, MD  I have reviewed the above documentation for accuracy and completeness, and I agree with the above. -Dennard Nip, MD

## 2019-02-10 ENCOUNTER — Telehealth: Payer: Self-pay | Admitting: Cardiology

## 2019-02-10 NOTE — Telephone Encounter (Signed)
° ° °  COVID-19 Pre-Screening Questions:   In the past 7 to 10 days have you had a cough,  shortness of breath, headache, congestion, fever (100 or greater) body aches, chills, sore throat, or sudden loss of taste or sense of smell? no  Have you been around anyone with known Covid 19. no  Have you been around anyone who is awaiting Covid 19 test results in the past 7 to 10 days? no  Have you been around anyone who has been exposed to Covid 19, or has mentioned symptoms of Covid 19 within the past 7 to 10 days? no  If you have any concerns/questions about symptoms patients report during screening (either on the phone or at threshold). Contact the provider seeing the patient or DOD for further guidance.  If neither are available contact a member of the leadership team.   I called pt to confirm her appt on 7-0-20 with Dr Ellyn Hack.

## 2019-02-11 ENCOUNTER — Telehealth: Payer: Self-pay | Admitting: Nurse Practitioner

## 2019-02-11 ENCOUNTER — Ambulatory Visit (INDEPENDENT_AMBULATORY_CARE_PROVIDER_SITE_OTHER): Payer: Medicare Other | Admitting: Cardiology

## 2019-02-11 ENCOUNTER — Telehealth: Payer: Self-pay

## 2019-02-11 ENCOUNTER — Encounter: Payer: Self-pay | Admitting: Cardiology

## 2019-02-11 ENCOUNTER — Other Ambulatory Visit: Payer: Self-pay

## 2019-02-11 VITALS — BP 130/84 | HR 89 | Temp 97.0°F | Ht 64.0 in | Wt 326.2 lb

## 2019-02-11 DIAGNOSIS — D649 Anemia, unspecified: Secondary | ICD-10-CM

## 2019-02-11 DIAGNOSIS — R072 Precordial pain: Secondary | ICD-10-CM

## 2019-02-11 DIAGNOSIS — Z01818 Encounter for other preprocedural examination: Secondary | ICD-10-CM

## 2019-02-11 DIAGNOSIS — I10 Essential (primary) hypertension: Secondary | ICD-10-CM

## 2019-02-11 DIAGNOSIS — R010 Benign and innocent cardiac murmurs: Secondary | ICD-10-CM

## 2019-02-11 DIAGNOSIS — I272 Pulmonary hypertension, unspecified: Secondary | ICD-10-CM | POA: Diagnosis not present

## 2019-02-11 DIAGNOSIS — Z6841 Body Mass Index (BMI) 40.0 and over, adult: Secondary | ICD-10-CM

## 2019-02-11 DIAGNOSIS — E662 Morbid (severe) obesity with alveolar hypoventilation: Secondary | ICD-10-CM

## 2019-02-11 DIAGNOSIS — R0789 Other chest pain: Secondary | ICD-10-CM | POA: Diagnosis not present

## 2019-02-11 MED ORDER — METOPROLOL TARTRATE 50 MG PO TABS: 50.0000 mg | ORAL_TABLET | Freq: Once | ORAL | 0 refills | Status: DC

## 2019-02-11 NOTE — Patient Instructions (Addendum)
Medication Instructions:  SEE INSTRUCTION FOR CCTA ( CORONARY CT ANGIOGRAM) IF TEST IS DONE I THE MONTH OF July 2020 - NO LABS ARE NEEDED.  Lab work:  SEE INSTRUCTION FOR CCTA ( CORONARY CT ANGIOGRAM)  Testing/Procedures: Will be schedule after prior authorization with your insurance [facility] Radiology Dept. Your physician has requested that you have cardiac CTA. Cardiac computed tomography (CT) is a painless test that uses an x-ray machine to take clear, detailed pictures of your heart. For further information please visit [email or web address]. Please follow instruction sheet as given.     Follow-Up: At [facility], you and your health needs are our priority.  As part of our continuing mission to provide you with exceptional heart care, we have created designated Provider Care Teams.  These Care Teams include your primary Cardiologist (physician) and Advanced Practice Providers (APPs -  Physician Assistants and Nurse Practitioners) who all work together to provide you with the care you need, when you need it. . You will need a follow up appointment in  3 months Oct 2020.  Please call our office 2 months in advance to schedule this appointment.  You may see Glenetta Hew, MD or one of the following Advanced Practice Providers on your designated Care Team:   . Rosaria Ferries, PA-C . Jory Sims, DNP, ANP  Any Other Special Instructions Will Be Listed Below .  Please keep your follow up appointments for sleep apnea and anemia      Please arrive at the [facility] main entrance of [facility] at  LEAST(30-45 minutes prior to test start time)  [facility] [place], [place] [address] [phone]  Proceed to the [facility] Radiology Department (First Floor).  Please follow these instructions carefully (unless otherwise directed):  PLEASE HAVE LAB DONE AT LEAST WEEK PRIOR TO TEST , IF TEST IS SCHEDULE IN THE MONTH OF July  2020 - YOU DO NOT NEED LAB WORK.  On the Night Before the Test: . Be sure to Drink plenty of water. . Do not consume any caffeinated/decaffeinated beverages or chocolate 12 hours prior to your test. . Do not take any antihistamines 12 hours prior to your test. . If you take Metformin do not take 24 hours prior to test.  On the Day of the Test: . Drink plenty of water. Do not drink any water within one hour of the test. . Do not eat any food 4 hours prior to the test. . You may take your regular medications prior to the test.  . Take metoprolol (Lopressor) 50 MG  two hours prior to test., THIS IS ADDITION TO YOU TAKING YOUR TOPROL XL 50 MG THAT MORNING. [name] HOLD Furosemide/Hydrochlorothiazide morning of the test.    After the Test: . Drink plenty of water. . After receiving IV contrast, you may experience a mild flushed feeling. This is normal. . On occasion, you may experience a mild rash up to 24 hours after the test. This is not dangerous. If this occurs, you can take Benadryl 25 mg and increase your fluid intake. . If you experience trouble breathing, this can be serious. If it is severe call 911 IMMEDIATELY. If it is mild, please call our office. . If you take any of these medications: Glipizide/Metformin, Avandament, Glucavance, please do not take 48 hours after completing test.

## 2019-02-11 NOTE — Telephone Encounter (Signed)
PROGRESS NOTE: Returned TC to pt in regarding to needing to come in and be seen by [facility] and to receive IV iron due to recent ER visit and having low iron. Per [name] sent scheduling message for schedulers to schedule patient for LAB, DOCTOR APPT @ 8:45, then [place] on Tuesady (02/16/19).

## 2019-02-11 NOTE — Telephone Encounter (Signed)
Scheduled appt per 7/09 sch message - pt aware of appt date and time .  

## 2019-02-11 NOTE — Progress Notes (Signed)
PCP: Maude Leriche, PA-C  Clinic Note: Chief Complaint  Patient presents with   New Patient (Initial Visit)    Chest pain, shortness of breath    HPI: Christine [name] is a 43 y.o. female with history of hypertension and obesity) who is being seen today for the evaluation of chest pain/pressure at the request of Starlyn Skeans, MD & Scifres, Durel Salts  Recent Hospitalizations:  ER visit January 30, 2019 --> ruled out ACS  Troponin neg; Neg Chest CTA  Anemic  Lab Results  Component Value Date   HGB 7.8 (L) 01/30/2019   [name] was seen via telehealth visit by her PCP following her ER visit.  Cardiology consult referral placed.  Studies Personally Reviewed - (if available, images/films reviewed: From Epic Chart or Care Everywhere)  TTE January 2019: EF 65 to 70% with vigorous wall motion.  Suggestion of high cardiac output (possibly related to anemia thyrotoxicosis, Pregnancy, sepsis etc.) -- was in setting of symptomatic anemia - Hgb 5.7  Chest CTA 01/30/2019 - ? Dilated / prominent pulmonary artery ? Pulm HTN.  No PE.  Lower lobe atelectatic change bilaterally.  Possible small airway obstructive disease noted.   Interval History: Christine [name] presents here today really with multiple different symptoms.  She says that a lot of what she is concerned about began with the fact that she had 3 bouts of bronchitis with hypoxia over the winter timeframe.  Since then she has been having off and on episodes of left-sided chest pain (left greater than right- can be both sides) where she describes it as a sharp pain and then a fullness.  Describes more discomfort and pain.  She says this can happen anytime of the day, not necessarily associate with any particular activity.  Maybe once or twice a day some days and then can go a couple days without having symptoms.  It does usually have at least once a day though.  She does say that it hurts with certain movements or with putting  pressure on her chest.  She said that this weekend was pretty bad, but yesterday and today have been pretty good with no significant symptoms. She says that she can walk around outside most days and not get short of breath, but with any type of exertion, carrying anything, she will get short of breath.  She is concerned about a CT scan that showed pulmonary hypertension from her last hospital visit.  She has been trying hard to lose weight with diet, having a hard time doing exercise.  But she is excited to say that she is lost 14 pounds.  She is also concerned because somewhere along the line she was told that she had a murmur.  She has chronic anemia from uterine fibroids and menorrhagia, most recently had hemoglobin checked which was below a began.  She really denies any PND or orthopnea except for when she first lies down she gets short of breath.  But does not have to sleep on extra pillows.  She does have some mild bilateral lower extremity edema but is not any more so than usual.  She is in the process of figuring out whether she is going to be going to sleep study evaluation for sleep apnea. Off-and-on heart skips a beat, but does not notice any rapid irregular heartbeats or palpitations.  No weakness or syncope/near syncope. No TIA/amaurosis fugax symptoms. No claudication.  ROS: A comprehensive was performed. Review of Systems  Constitutional: Positive for  malaise/fatigue (Or because of deconditioning) and weight loss (14 pounds, intentional).  HENT: Negative for congestion and nosebleeds.   Respiratory: Positive for shortness of breath. Negative for cough.   Cardiovascular: Positive for leg swelling (Stable).  Gastrointestinal: Negative for abdominal pain, blood in stool and constipation.  Genitourinary: Negative for dysuria and hematuria.       Menorrhagia  Musculoskeletal: Positive for back pain and joint pain. Negative for falls and myalgias.  Neurological: Positive for  weakness (Global).  Psychiatric/Behavioral: Negative for memory loss. The patient is nervous/anxious. The patient does not have insomnia.   All other systems reviewed and are negative.  The patient does not have symptoms concerning for COVID-19 infection (fever, chills, cough, or new shortness of breath).  The patient is practicing social distancing.  Coronavirus (COVID-19) screening (Dec 10, 2018): SARS-CoV2, NAD nondetected   COVID-19 Education: The signs and symptoms of COVID-19 were discussed with the patient and how to seek care for testing (follow up with PCP or arrange E-visit).   The importance of social distancing was discussed today.   I have reviewed and (if needed) personally updated the patient's problem list, medications, allergies, past medical and surgical history, social and family history.   Past Medical History:  Diagnosis Date   Anemia    Presumably from menorrhagia/menomenorrhagia; has had both blood and iron transfusions   Anxiety    Back pain    Bipolar 1 disorder ([place])    With depression   Depression    Essential hypertension    Fibromyalgia    Headache    History of blood transfusion 08/2017   [facility]   History of hiatal hernia    Infertility, female    Lactose intolerance    Lower extremity edema    Migraines    Morbid obesity (HCC)    Osteoarthritis    Hands, ankles; HLA-B27 positive   PCOS (polycystic ovarian syndrome)    Seasonal allergies    With recurrent allergic rhinitis   Uterine leiomyoma    Vitamin D deficiency     Past Surgical History:  Procedure Laterality Date   CERVICAL POLYPECTOMY  01/15/2018   Procedure: CERVICAL POLYPECTOMY;  Surgeon: Arvella Nigh, MD;  Location: [facility];  Service: Gynecology;;   COLONOSCOPY  2008   Normal   DIAGNOSTIC LAPAROSCOPY  08/1999   dermoid cyst, [facility]   DILATION AND CURETTAGE OF UTERUS  05/2004   MAB   DILATION AND CURETTAGE OF UTERUS  04/2015   HYSTEROSCOPY W/D&C  07/22/2011    Procedure: DILATATION AND CURETTAGE /HYSTEROSCOPY;  Surgeon: Cyril Mourning, MD;  Location: [facility];  Service: Gynecology;;   HYSTEROSCOPY W/D&C N/A 01/15/2018   Procedure: DILATATION AND CURETTAGE [place] WITH MYOSURE;  Surgeon: Arvella Nigh, MD;  Location: [facility];  Service: Gynecology;  Laterality: N/A;   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  Suggestion of high cardiac output (possibly related to anemia thyrotoxicosis, Pregnancy, sepsis etc.) -- was in setting of symptomatic anemia - Hgb 5.7    Current Meds  Medication Sig   amLODipine (NORVASC) 10 MG tablet Take 1 tablet (10 mg total) by mouth daily.   ARIPiprazole (ABILIFY) 15 MG tablet Take 1 tablet (15 mg total) by mouth daily.   clindamycin (CLEOCIN) 150 MG capsule Take by mouth 4 (four) times daily.   clonazePAM (KLONOPIN) 0.5 MG tablet Take 1 tablet (0.5 mg total) by  mouth daily as needed for anxiety.   Erenumab-aooe (AIMOVIG) 140 MG/ML SOAJ Inject 140 mg into the skin every 30 (thirty) days.   lamoTRIgine (LAMICTAL) 150 MG tablet Take 1 tablet (150 mg total) by mouth 2 (two) times daily. For mood   lithium carbonate 300 MG capsule Take 2 capsule in AM and 3 capsule at bed time   metFORMIN (GLUCOPHAGE) 500 MG tablet Take 1 tablet (500 mg total) by mouth 2 (two) times daily with a meal.   metoprolol succinate (TOPROL-XL) 50 MG 24 hr tablet Take 50 mg by mouth daily. Take with or immediately following a meal.   Vitamin D, Ergocalciferol, (DRISDOL) 1.25 MG (50000 UT) CAPS capsule Take 1 capsule (50,000 Units total) by mouth every 7 (seven) days.    Allergies  Allergen Reactions   Penicillins Rash and Other (See Comments)    Has patient had a PCN reaction causing immediate rash, facial/tongue/throat swelling, SOB or lightheadedness with hypotension: Yes Has patient had a PCN reaction causing severe rash involving mucus  membranes or skin necrosis: Unknown Has patient had a PCN reaction that required hospitalization: No Has patient had a PCN reaction occurring within the last 10 years: No If all of the above answers are "NO", then may proceed with Cephalosporin use.     Social History   Tobacco Use   Smoking status: Never Smoker   Smokeless tobacco: Never Used  Substance Use Topics   Alcohol use: Yes    Comment: occasional, maybe once a month or so   Drug use: No   Social History   Social History Narrative   Lives at home with her husband and 2 adopted daughters.   Right handed   Caffeine: decreased intake, drinks rarely    Is on long-term disability.    family history includes Anxiety disorder in her mother; Cancer in her father and mother; Cancer (age of onset: 45) in her brother; Depression in her mother; Diabetes in her father; Heart disease in her father; High blood pressure in her brother, father, and mother; Hyperlipidemia in her father and mother; Hypertension in her father and mother; Kidney disease in her father; Obesity in her father; Sleep apnea in her father; Stroke in her father.  Wt Readings from Last 3 Encounters:  02/11/19 (!) 326 lb 3.2 oz (148 kg)  02/09/19 (!) 323 lb (146.5 kg)  10/21/18 (!) 335 lb (152 kg)    PHYSICAL EXAM BP 130/84    Pulse 89    Temp (!) 97 F (36.1 C)    Ht '5\' 4"'$  (1.626 m)    Wt (!) 326 lb 3.2 oz (148 kg)    SpO2 92%    BMI 55.99 kg/m  Physical Exam  Constitutional: She is oriented to person, place, and time. No distress.  Super morbidly obese.  Well-groomed.  HENT:  Head: Normocephalic and atraumatic.  Eyes: Pupils are equal, round, and reactive to light. Conjunctivae and EOM are normal. No scleral icterus.  Neck: Neck supple. No hepatojugular reflux and no JVD (Cannot assess) present. Carotid bruit is not present.  Cardiovascular: Normal rate, regular rhythm, S1 normal and S2 normal.  No extrasystoles are present. PMI is not displaced (Unable  to assess). Exam reveals distant heart sounds and decreased pulses (Decreased pedal pulses, likely because of edema and adiposity.  Definitely palpable.). Exam reveals no gallop and no friction rub.  Murmur (1-2/6 SEM at RUSB.) heard. Pulmonary/Chest: Effort normal. No respiratory distress. She exhibits tenderness (Notable point tenderness along  the left sternal border.  Somewhat reproduces her symptoms.).  Diminished breath sounds bilaterally, likely because of body habitus.  No obvious wheezes or rhonchi.  Abdominal: Soft. Bowel sounds are normal. She exhibits no distension. There is no abdominal tenderness. There is no rebound.  Notable truncal obesity.  Unable to assess for HSM.  Musculoskeletal: Normal range of motion.        General: Edema (Trivial bilateral lower extremity edema, difficult to tell between edema and adiposity.) present.  Neurological: She is alert and oriented to person, place, and time. No cranial nerve deficit.  Skin: Skin is warm and dry. She is not diaphoretic. No erythema.  Psychiatric: Her behavior is normal. Judgment and thought content normal.  Somewhat anxious.  Otherwise normal mood and affect.  Vitals reviewed.    Adult ECG Report  Rate: 89 ;  Rhythm: normal sinus rhythm and Normal axis, intervals and durations;   Narrative Interpretation: Normal EKG   Other studies Reviewed: Additional studies/ records that were reviewed today include:  Recent Labs:   CBC Latest Ref Rng & Units 01/30/2019 09/07/2018 08/19/2018  WBC 4.0 - 10.5 K/uL 9.6 12.0(H) 9.1  Hemoglobin 12.0 - 15.0 g/dL 7.8(L) 10.1(L) 9.8(L)  Hematocrit 36.0 - 46.0 % 28.9(L) 36.5 35.5(L)  Platelets 150 - 400 K/uL 356 404(H) 340   Lab Results  Component Value Date   CREATININE 0.69 01/30/2019   BUN 14 01/30/2019   NA 137 01/30/2019   K 3.8 01/30/2019   CL 107 01/30/2019   CO2 20 (L) 01/30/2019   Lab Results  Component Value Date   CHOL 183 10/07/2018   HDL 38 (L) 10/07/2018   LDLCALC 131  (H) 10/07/2018   TRIG 70 10/07/2018   ASSESSMENT / PLAN: Problem List Items Addressed This Visit    Symptomatic anemia - Primary    I really think her dyspnea can be explained by combination of deconditioning, obesity and now exacerbated by her recurrent anemia. I think we need to treat the anemia and reevaluate.  Would also need to see stable hemoglobin levels before we could consider cardiac catheterization if the CT coronary angiogram is suggestive of significant disease.      Relevant Orders   Basic metabolic panel   Pulmonary hypertension, unspecified ([place])    Pulmonary pretension was suggested on CT scan done for PE evaluation.  This will be placed on somewhat dilated pulmonary artery.  Not unlikely for somebody with obesity and possible OSA/OHS to have some pulmonary pretension.  Also not unlikely with her level of anemia.  I do not think that she has significant pulmonary hypertension, and this can only be evaluated accurately with right heart catheterization.  Would prefer to avoid right heart cath since she does not really have any significant edema to suspect right heart failure from significant pulmonary tension.  She is already on calcium channel blocker which and first-line treatment other than evaluating and treating for possible OSA.      Relevant Orders   CT CORONARY MORPH W/CTA COR W/SCORE W/CA W/CM &/OR WO/CM   CT CORONARY FRACTIONAL FLOW RESERVE DATA PREP   CT CORONARY FRACTIONAL FLOW RESERVE FLUID ANALYSIS   Basic metabolic panel   HTN (hypertension)    Blood pressure is pretty well controlled on amlodipine and Toprol.      Functional systolic murmur    She had an echocardiogram done less than 2 years ago that did not show any significant valvular disease.  I would suspect based on the  findings in the setting of severe anemia and hyperdynamic LV with a positive BX that her systolic ejection murmur is a flow murmur from anemia.  Not uncommon to hear such murmur in  the setting of menstruation related anemia. I hear similar soft systolic flow murmur which is probably benign.  I do not feel like it is reasonable to reassess at this time, especially with her being anemic again.  There is no suggestion of pulmonary hypertension on the echocardiogram previously on as well.      Class 3 obesity with alveolar hypoventilation, serious comorbidity, and body mass index (BMI) of 50.0 to 59.9 in adult [facility]) (Chronic)    Clearly if she has alveolar hypoventilation there will be symptoms that would suggest obesity hypoventilation syndrome and even possible OSA.  Needs to be evaluated for sleep apnea.  This could explain pulmonary tension.  Explained to her the importance of weight loss.  She is working on weight loss, but is fearful of exercising with her chest pain. We will evaluate for coronary ischemia coronary CT angiogram which will help reassure her.      Atypical chest pain    She has both some typical and atypical features for chest pain being anginal in nature.  There is some reproducibility with exam.  But however she does note some exacerbation with activity.  I do not think that a stress test will be helpful at all.  Treadmill EKG would clearly have artifact based on her body habitus, and any imaging studies such as Myoview echocardiogram would most certainly have breast attenuation artifact.  At this point, I think probably the best way to evaluate for coronary disease that would be obstructive would not his coronary calcium score require CT angiogram.  If she does have existing coronary disease, would be concerned about her being somewhat symptomatic with anemia.  As such, I would recommend treating anemia.  Would be very reluctant to consider cardiac catheterization PCI in a patient who has history of menorrhagia.  Plan: Coronary calcium scoring coronary CT angiogram.      Relevant Orders   CT CORONARY MORPH W/CTA COR W/SCORE W/CA W/CM &/OR WO/CM   CT  CORONARY FRACTIONAL FLOW RESERVE DATA PREP   CT CORONARY FRACTIONAL FLOW RESERVE FLUID ANALYSIS   Basic metabolic panel    Other Visit Diagnoses    Pre-op testing       Relevant Orders   CT CORONARY MORPH W/CTA COR W/SCORE W/CA W/CM &/OR WO/CM   CT CORONARY FRACTIONAL FLOW RESERVE DATA PREP   CT CORONARY FRACTIONAL FLOW RESERVE FLUID ANALYSIS   Basic metabolic panel     I spent a total of 35 minutes with the patient and chart review. >  50% of the time was spent in direct patient consultation.  An additional 15 minutes spent in chart review with referring provider notes and hospitalization records.  Current medicines are reviewed at length with the patient today.  (+/- concerns) none The following changes have been made:  none  Patient Instructions  Medication Instructions:  SEE INSTRUCTION FOR CCTA ( CORONARY CT ANGIOGRAM) IF TEST IS DONE I THE MONTH OF July 2020 - NO LABS ARE NEEDED.  Lab work:  SEE INSTRUCTION FOR CCTA ( CORONARY CT ANGIOGRAM)  Testing/Procedures: Will be schedule after prior authorization with your insurance [facility] Radiology Dept. Your physician has requested that you have cardiac CTA. Cardiac computed tomography (CT) is a painless test that uses an x-ray machine to  take clear, detailed pictures of your heart. For further information please visit [email or web address][facility][email or web address]. Please follow instruction sheet as given.     Follow-Up: At [facility], you and your health needs are our priority.  As part of our continuing mission to provide you with exceptional heart care, we have created designated Provider Care Teams.  These Care Teams include your primary Cardiologist (physician) and Advanced Practice Providers (APPs -  Physician Assistants and Nurse Practitioners) who all work together to provide you with the care you need, when you need it.  You will need a follow up appointment in  3 months Oct 2020.  Please call our office 2 months in advance  to schedule this appointment.  You may see Glenetta Hew, MD or one of the following Advanced Practice Providers on your designated Care Team:    Rosaria Ferries, PA-C  Jory Sims, DNP, ANP  Any Other Special Instructions Will Be Listed Below .  Please keep your follow up appointments for sleep apnea and anemia      Please arrive at the [facility] main entrance of [facility] at  LEAST(30-45 minutes prior to test start time)  [facility] [place], [place] [address] [phone]  Proceed to the [facility] Radiology Department (First Floor).  Please follow these instructions carefully (unless otherwise directed):  PLEASE HAVE LAB DONE AT LEAST WEEK PRIOR TO TEST , IF TEST IS SCHEDULE IN THE MONTH OF July 2020 - YOU DO NOT NEED LAB WORK.  On the Night Before the Test:  Be sure to Drink plenty of water.  Do not consume any caffeinated/decaffeinated beverages or chocolate 12 hours prior to your test.  Do not take any antihistamines 12 hours prior to your test.  If you take Metformin do not take 24 hours prior to test.  On the Day of the Test:  Drink plenty of water. Do not drink any water within one hour of the test.  Do not eat any food 4 hours prior to the test.  You may take your regular medications prior to the test.   Take metoprolol (Lopressor) 50 MG  two hours prior to test., THIS IS ADDITION TO YOU TAKING YOUR TOPROL XL 50 MG THAT MORNING.  HOLD Furosemide/Hydrochlorothiazide morning of the test.    After the Test:  Drink plenty of water.  After receiving IV contrast, you may experience a mild flushed feeling. This is normal.  On occasion, you may experience a mild rash up to 24 hours after the test. This is not dangerous. If this occurs, you can take Benadryl 25 mg and increase your fluid intake.  If you experience trouble breathing, this can be serious. If it is severe call 911 IMMEDIATELY. If it is mild, please  call our office.  If you take any of these medications: Glipizide/Metformin, Avandament, Glucavance, please do not take 48 hours after completing test.       Studies Ordered:   Orders Placed This Encounter  Procedures   CT CORONARY MORPH W/CTA COR W/SCORE W/CA W/CM &/OR WO/CM   CT CORONARY FRACTIONAL FLOW RESERVE DATA PREP   CT CORONARY FRACTIONAL FLOW RESERVE FLUID ANALYSIS   Basic metabolic panel      Glenetta Hew, M.D., M.S. Interventional Cardiologist   Pager # [phone] Phone # [phone] [address] [place], [place] [address]   Thank you for choosing Heart[facility] at [facility]!!

## 2019-02-13 ENCOUNTER — Encounter: Payer: Self-pay | Admitting: Cardiology

## 2019-02-13 NOTE — Assessment & Plan Note (Signed)
Clearly if she has alveolar hypoventilation there will be symptoms that would suggest obesity hypoventilation syndrome and even possible OSA.  Needs to be evaluated for sleep apnea.  This could explain pulmonary tension.  Explained to her the importance of weight loss.  She is working on weight loss, but is fearful of exercising with her chest pain. We will evaluate for coronary ischemia coronary CT angiogram which will help reassure her.

## 2019-02-13 NOTE — Assessment & Plan Note (Signed)
Blood pressure is pretty well controlled on amlodipine and Toprol.

## 2019-02-13 NOTE — Assessment & Plan Note (Addendum)
She had an echocardiogram done less than 2 years ago that did not show any significant valvular disease.  I would suspect based on the findings in the setting of severe anemia and hyperdynamic LV with a positive BX that her systolic ejection murmur is a flow murmur from anemia.  Not uncommon to hear such murmur in the setting of menstruation related anemia. I hear similar soft systolic flow murmur which is probably benign.  I do not feel like it is reasonable to reassess at this time, especially with her being anemic again.  There is no suggestion of pulmonary hypertension on the echocardiogram previously on as well.

## 2019-02-13 NOTE — Assessment & Plan Note (Signed)
Pulmonary pretension was suggested on CT scan done for PE evaluation.  This will be placed on somewhat dilated pulmonary artery.  Not unlikely for somebody with obesity and possible OSA/OHS to have some pulmonary pretension.  Also not unlikely with her level of anemia.  I do not think that she has significant pulmonary hypertension, and this can only be evaluated accurately with right heart catheterization.  Would prefer to avoid right heart cath since she does not really have any significant edema to suspect right heart failure from significant pulmonary tension.  She is already on calcium channel blocker which and first-line treatment other than evaluating and treating for possible OSA.

## 2019-02-13 NOTE — Assessment & Plan Note (Signed)
She has both some typical and atypical features for chest pain being anginal in nature.  There is some reproducibility with exam.  But however she does note some exacerbation with activity.  I do not think that a stress test will be helpful at all.  Treadmill EKG would clearly have artifact based on her body habitus, and any imaging studies such as Myoview echocardiogram would most certainly have breast attenuation artifact.  At this point, I think probably the best way to evaluate for coronary disease that would be obstructive would not his coronary calcium score require CT angiogram.  If she does have existing coronary disease, would be concerned about her being somewhat symptomatic with anemia.  As such, I would recommend treating anemia.  Would be very reluctant to consider cardiac catheterization PCI in a patient who has history of menorrhagia.  Plan: Coronary calcium scoring coronary CT angiogram.

## 2019-02-13 NOTE — Assessment & Plan Note (Signed)
I really think her dyspnea can be explained by combination of deconditioning, obesity and now exacerbated by her recurrent anemia. I think we need to treat the anemia and reevaluate.  Would also need to see stable hemoglobin levels before we could consider cardiac catheterization if the CT coronary angiogram is suggestive of significant disease.

## 2019-02-15 NOTE — Addendum Note (Signed)
Addended by: Venetia Maxon on: 02/15/2019 03:30 PM   Modules accepted: Orders

## 2019-02-16 ENCOUNTER — Inpatient Hospital Stay (HOSPITAL_BASED_OUTPATIENT_CLINIC_OR_DEPARTMENT_OTHER): Payer: Medicare Other | Admitting: Nurse Practitioner

## 2019-02-16 ENCOUNTER — Telehealth: Payer: Self-pay

## 2019-02-16 ENCOUNTER — Encounter: Payer: Self-pay | Admitting: Nurse Practitioner

## 2019-02-16 ENCOUNTER — Inpatient Hospital Stay: Payer: Medicare Other | Attending: Nurse Practitioner

## 2019-02-16 ENCOUNTER — Other Ambulatory Visit: Payer: Self-pay

## 2019-02-16 ENCOUNTER — Inpatient Hospital Stay: Payer: Medicare Other

## 2019-02-16 ENCOUNTER — Telehealth: Payer: Self-pay | Admitting: Nurse Practitioner

## 2019-02-16 VITALS — BP 109/74 | HR 75 | Temp 99.0°F | Resp 32

## 2019-02-16 VITALS — BP 133/89 | HR 108 | Temp 98.2°F | Resp 20 | Ht 64.0 in | Wt 329.4 lb

## 2019-02-16 DIAGNOSIS — D500 Iron deficiency anemia secondary to blood loss (chronic): Secondary | ICD-10-CM

## 2019-02-16 DIAGNOSIS — I10 Essential (primary) hypertension: Secondary | ICD-10-CM | POA: Insufficient documentation

## 2019-02-16 DIAGNOSIS — Z79899 Other long term (current) drug therapy: Secondary | ICD-10-CM

## 2019-02-16 DIAGNOSIS — N920 Excessive and frequent menstruation with regular cycle: Secondary | ICD-10-CM | POA: Insufficient documentation

## 2019-02-16 LAB — CBC WITH DIFFERENTIAL (CANCER CENTER ONLY)
Abs Immature Granulocytes: 0.05 10*3/uL (ref 0.00–0.07)
Basophils Absolute: 0 10*3/uL (ref 0.0–0.1)
Basophils Relative: 1 %
Eosinophils Absolute: 0.1 10*3/uL (ref 0.0–0.5)
Eosinophils Relative: 1 %
HCT: 28.8 % — ABNORMAL LOW (ref 36.0–46.0)
Hemoglobin: 7.8 g/dL — ABNORMAL LOW (ref 12.0–15.0)
Immature Granulocytes: 1 %
Lymphocytes Relative: 21 %
Lymphs Abs: 1.6 10*3/uL (ref 0.7–4.0)
MCH: 18.7 pg — ABNORMAL LOW (ref 26.0–34.0)
MCHC: 27.1 g/dL — ABNORMAL LOW (ref 30.0–36.0)
MCV: 69.1 fL — ABNORMAL LOW (ref 80.0–100.0)
Monocytes Absolute: 0.6 10*3/uL (ref 0.1–1.0)
Monocytes Relative: 9 %
Neutro Abs: 5 10*3/uL (ref 1.7–7.7)
Neutrophils Relative %: 67 %
Platelet Count: 385 10*3/uL (ref 150–400)
RBC: 4.17 MIL/uL (ref 3.87–5.11)
RDW: 20.6 % — ABNORMAL HIGH (ref 11.5–15.5)
WBC Count: 7.3 10*3/uL (ref 4.0–10.5)
nRBC: 0.4 % — ABNORMAL HIGH (ref 0.0–0.2)

## 2019-02-16 LAB — CMP (CANCER CENTER ONLY)
ALT: 14 U/L (ref 0–44)
AST: 9 U/L — ABNORMAL LOW (ref 15–41)
Albumin: 3.8 g/dL (ref 3.5–5.0)
Alkaline Phosphatase: 66 U/L (ref 38–126)
Anion gap: 10 (ref 5–15)
BUN: 13 mg/dL (ref 6–20)
CO2: 22 mmol/L (ref 22–32)
Calcium: 8.9 mg/dL (ref 8.9–10.3)
Chloride: 108 mmol/L (ref 98–111)
Creatinine: 0.87 mg/dL (ref 0.44–1.00)
GFR, Est AFR Am: >60 mL/min (ref >60)
GFR, Estimated: >60 mL/min (ref >60)
Glucose, Bld: 119 mg/dL — ABNORMAL HIGH (ref 70–99)
Potassium: 3.7 mmol/L (ref 3.5–5.1)
Sodium: 140 mmol/L (ref 135–145)
Total Bilirubin: 0.4 mg/dL (ref 0.3–1.2)
Total Protein: 7.3 g/dL (ref 6.5–8.1)

## 2019-02-16 LAB — FERRITIN: Ferritin: <4 ng/mL — ABNORMAL LOW (ref 11–307)

## 2019-02-16 LAB — IRON AND TIBC
Iron: 18 ug/dL — ABNORMAL LOW (ref 41–142)
Saturation Ratios: 4 % — ABNORMAL LOW (ref 21–57)
TIBC: 435 ug/dL (ref 236–444)
UIBC: 417 ug/dL — ABNORMAL HIGH (ref 120–384)

## 2019-02-16 MED ORDER — DIPHENHYDRAMINE HCL 50 MG/ML IJ SOLN
12.5000 mg | Freq: Once | INTRAMUSCULAR | Status: AC
  Administered 2019-02-16: 12.5 mg via INTRAVENOUS

## 2019-02-16 MED ORDER — DIPHENHYDRAMINE HCL 50 MG/ML IJ SOLN
INTRAMUSCULAR | Status: AC
  Filled 2019-02-16: qty 1

## 2019-02-16 MED ORDER — SODIUM CHLORIDE 0.9 % IV SOLN
510.0000 mg | Freq: Once | INTRAVENOUS | Status: AC
  Administered 2019-02-16: 510 mg via INTRAVENOUS
  Filled 2019-02-16: qty 510

## 2019-02-16 MED ORDER — SODIUM CHLORIDE 0.9 % IV SOLN
Freq: Once | INTRAVENOUS | Status: AC
  Administered 2019-02-16: 10:00:00 via INTRAVENOUS
  Filled 2019-02-16: qty 250

## 2019-02-16 MED ORDER — FAMOTIDINE IN NACL 20-0.9 MG/50ML-% IV SOLN
INTRAVENOUS | Status: AC
  Filled 2019-02-16: qty 50

## 2019-02-16 MED ORDER — FAMOTIDINE IN NACL 20-0.9 MG/50ML-% IV SOLN
20.0000 mg | Freq: Once | INTRAVENOUS | Status: AC
  Administered 2019-02-16: 10:00:00 20 mg via INTRAVENOUS

## 2019-02-16 NOTE — Patient Instructions (Signed)

## 2019-02-16 NOTE — Telephone Encounter (Signed)
Scheduled appt per 7/14 los.  Printed calendar per patient request

## 2019-02-16 NOTE — Telephone Encounter (Signed)
TC to patient per [name] to let her know  iron is very low. Continue with current plan for iron and blood transfusion over the next week. I also let her know that [name] added on IV Feraheme after lab appt on 8/14 and 8/21. I told her to make sure and call back if symptoms worsen or she has new concerns. Patient verbalized understanding. No further problems or concerns at this time.

## 2019-02-16 NOTE — Progress Notes (Signed)
Christine [name]   [phone] Fax:([phone]) [phone]   Clinic Follow up Note   Patient Care Team: Scifres, Durel Salts as PCP - General (Physician Assistant) 02/16/2019  CHIEF COMPLAINT: f/u iron deficiency anemia   CURRENT THERAPY: IV Feraheme PRN, last given 01/29/2018  INTERVAL HISTORY: Christine [name] returns for f/u iron deficiency anemia. She was last seen 01/2018 and received IV Feraheme 01/22/18 and 01/29/18. Iron levels improved in 03-21-18. Her mother passed away before her 05/22/2023 appt and she lost to f/u since then. She recently presented to ER with chest pain and dyspnea, cardiac work up was negative; Hg 7.8 at that time. She followed up with cardiology who felt her symptoms were related to worsening anemia. Her cardiology work up is ongoing, will have sleep study soon. She notes increased fatigue lately, can complete ADLs with effort. Chest pain has slowly gone away. Has dyspnea on exertion. Feels her heart beats faster at times. Denies headache, dizziness. Denies obvious bloody or black stool, but has been using heavy NSAIDs for tooth infection recently. LMP on 6/28, lasting 7 days every 25 days. Bleeding is heavy for 3-4 days each cycle. She is considering hysterectomy and will see GYN Dr. Radene Knee this week.    MEDICAL HISTORY:  Past Medical History:  Diagnosis Date  . Anemia    Presumably from menorrhagia/menomenorrhagia; has had both blood and iron transfusions  . Anxiety   . Back pain   . Bipolar 1 disorder ([place])    With depression  . Depression   . Essential hypertension   . Fibromyalgia   . Headache   . History of blood transfusion 08/2017   [facility]  . History of hiatal hernia   . Infertility, female   . Lactose intolerance   . Lower extremity edema   . Migraines   . Morbid obesity ([place])   . Osteoarthritis    Hands, ankles; HLA-B27 positive  . PCOS (polycystic ovarian syndrome)   . Seasonal allergies    With recurrent allergic rhinitis  . Uterine  leiomyoma   . Vitamin D deficiency     SURGICAL HISTORY: Past Surgical History:  Procedure Laterality Date  . CERVICAL POLYPECTOMY  01/15/2018   Procedure: CERVICAL POLYPECTOMY;  Surgeon: Arvella Nigh, MD;  Location: [facility];  Service: Gynecology;;  . COLONOSCOPY  2008   Normal  . DIAGNOSTIC LAPAROSCOPY  08/1999   dermoid cyst, RSO  . DILATION AND CURETTAGE OF UTERUS  05/21/2004   MAB  . DILATION AND CURETTAGE OF UTERUS  04/2015  . HYSTEROSCOPY W/D&C  07/22/2011   Procedure: DILATATION AND CURETTAGE /HYSTEROSCOPY;  Surgeon: Cyril Mourning, MD;  Location: [facility];  Service: Gynecology;;  . HYSTEROSCOPY W/D&C N/A 01/15/2018   Procedure: DILATATION AND CURETTAGE [place] WITH MYOSURE;  Surgeon: Arvella Nigh, MD;  Location: [facility];  Service: Gynecology;  Laterality: N/A;  . 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 TRANSTHORACIC ECHOCARDIOGRAM  08/2017    EF 65 to 70% with vigorous wall motion.  Suggestion of high cardiac output (possibly related to anemia thyrotoxicosis, Pregnancy, sepsis etc.) -- was in setting of symptomatic anemia - Hgb 5.7    I have reviewed the social history and family history with the patient and they are unchanged from previous note.  ALLERGIES:  is allergic to penicillins.  MEDICATIONS:  Current Outpatient Medications  Medication Sig Dispense Refill  . amLODipine (NORVASC) 10 MG tablet Take 1 tablet (10 mg total) by mouth daily. 30 tablet 0  .  ARIPiprazole (ABILIFY) 15 MG tablet Take 1 tablet (15 mg total) by mouth daily. 30 tablet 2  . clindamycin (CLEOCIN) 150 MG capsule Take by mouth 4 (four) times daily.    . clonazePAM (KLONOPIN) 0.5 MG tablet Take 1 tablet (0.5 mg total) by mouth daily as needed for anxiety. 30 tablet 0  . Erenumab-aooe (AIMOVIG) 140 MG/ML SOAJ Inject 140 mg into the skin every 30 (thirty) days. 1 pen 11  . lamoTRIgine (LAMICTAL) 150 MG tablet Take 1 tablet (150 mg total) by mouth 2 (two) times daily. For mood 60 tablet 2  .  lithium carbonate 300 MG capsule Take 2 capsule in AM and 3 capsule at bed time 150 capsule 2  . metFORMIN (GLUCOPHAGE) 500 MG tablet Take 1 tablet (500 mg total) by mouth 2 (two) times daily with a meal. 60 tablet 0  . metoprolol succinate (TOPROL-XL) 50 MG 24 hr tablet Take 50 mg by mouth daily. Take with or immediately following a meal.    . Vitamin D, Ergocalciferol, (DRISDOL) 1.25 MG (50000 UT) CAPS capsule Take 1 capsule (50,000 Units total) by mouth every 7 (seven) days. 4 capsule 0  . metoprolol tartrate (LOPRESSOR) 50 MG tablet Take 1 tablet (50 mg total) by mouth once for 1 dose. TAKE TWO HOUR PRIOR TO  SCHEDULE CARDAIC TEST 1 tablet 0   No current facility-administered medications for this visit.     PHYSICAL EXAMINATION: ECOG PERFORMANCE STATUS: 1-2  Vitals:   02/16/19 0856  BP: 133/89  Pulse: (!) 108  Resp: 20  Temp: 98.2 F (36.8 C)  SpO2: 99%   Filed Weights   02/16/19 0856  Weight: (!) 329 lb 6.4 oz (149.4 kg)    GENERAL:alert, no distress and comfortable SKIN: no significant rash  EYES: sclera clear LUNGS: distant breath sounds. Respirations even and unlabored  HEART: regular rate & rhythm, positive for murmur; no lower extremity edema ABDOMEN: obese Musculoskeletal:no cyanosis of digits NEURO: alert & oriented x 3 with fluent speech, no focal motor/sensory deficits. Normal gait Limited exam for covid19 outbreak  LABORATORY DATA:  I have reviewed the data as listed CBC Latest Ref Rng & Units 02/16/2019 01/30/2019 09/07/2018  WBC 4.0 - 10.5 K/uL 7.3 9.6 12.0(H)  Hemoglobin 12.0 - 15.0 g/dL 7.8(L) 7.8(L) 10.1(L)  Hematocrit 36.0 - 46.0 % 28.8(L) 28.9(L) 36.5  Platelets 150 - 400 K/uL 385 356 404(H)     CMP Latest Ref Rng & Units 02/16/2019 01/30/2019 10/07/2018  Glucose 70 - 99 mg/dL 119(H) 93 78  BUN 6 - 20 mg/dL '13 14 10  '$ Creatinine 0.44 - 1.00 mg/dL 0.87 0.69 0.77  Sodium 135 - 145 mmol/L 140 137 138  Potassium 3.5 - 5.1 mmol/L 3.7 3.8 4.4  Chloride 98 -  111 mmol/L 108 107 104  CO2 22 - 32 mmol/L 22 20(L) 21  Calcium 8.9 - 10.3 mg/dL 8.9 9.3 9.5  Total Protein 6.5 - 8.1 g/dL 7.3 - 7.0  Total Bilirubin 0.3 - 1.2 mg/dL 0.4 - 0.4  Alkaline Phos 38 - 126 U/L 66 - 65  AST 15 - 41 U/L 9(L) - 11  ALT 0 - 44 U/L 14 - 13      RADIOGRAPHIC STUDIES: I have personally reviewed the radiological images as listed and agreed with the findings in the report. No results found.   ASSESSMENT & PLAN: Christine [name] is a pleasant 43 yo AAF with history of uterine fibroids, polyps, PCOS, obesity, HTN, and arthritis found to have severe  iron deficiency anemia secondary to chronic blood loss   1. 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 -She required frequent IV Feraheme support in the past, from January thru June 2019, then she lost to f/u -Recently presented with symptomatic anemia with Hg 7.8, labs reveal severe iron deficiency with ferritin <4, serum iron 18, 4% transferrin sat   2.  HTN -on amlodipine and metoprolol  -BP 133/89 today -f/u with PCP, cardiology   3.  Overall health and wellness -currently under the care of weight loss provider at wellness center, PCP, cardiology, and GYN -I encouraged her to eat well and exercise as tolerated  Dispo: Iron deficiency anemia felt to be related to menorrhagia, worsened over the last year. Hg 7.8 today, iron studies reveal severe iron deficiency. She is symptomatic with fatigue, dyspnea, and recent chest pain. She is considering hysterectomy, will f/u with GYN this week. Will do FOBT stool card to r/o GI source of bleeding. If positive, will refer to GI. Cardiology work up is ongoing, Dr. Ellyn Hack felt chest pain and dyspnea symptoms to be related to anemia. Will treat aggressively with IV Feraheme today and next week and 1 unit RBC in the next week. We reviewed risk and benefit of IV Feraheme and RBC, she agrees to proceed. Her baseline Hg is 10-11. Will monitor her closely  with lab monthly x3. Given her labs today, will plan to do IV Feraheme weekly x2 next month as well, then as needed. F/u in 3 months or sooner if needed. Patient agrees with the plan.   PLAN: -Labs reviewed -IV Feraheme today and next week -1 unit RBC in the next week -FOBT stool cards given, if positive will refer to GI -Lab monthly x3 -IV Feraheme (weekly x2) with lab next month -F/u in 3 months   All questions were answered. The patient knows to call the clinic with any problems, questions or concerns. No barriers to learning was detected.     Alla Feeling, NP 02/16/19

## 2019-02-22 ENCOUNTER — Encounter (INDEPENDENT_AMBULATORY_CARE_PROVIDER_SITE_OTHER): Payer: Self-pay | Admitting: Family Medicine

## 2019-02-22 ENCOUNTER — Ambulatory Visit (INDEPENDENT_AMBULATORY_CARE_PROVIDER_SITE_OTHER): Payer: Medicare Other | Admitting: Family Medicine

## 2019-02-22 ENCOUNTER — Other Ambulatory Visit: Payer: Self-pay

## 2019-02-22 VITALS — BP 124/71 | HR 91 | Temp 98.6°F | Ht 64.0 in | Wt 323.0 lb

## 2019-02-22 DIAGNOSIS — Z6841 Body Mass Index (BMI) 40.0 and over, adult: Secondary | ICD-10-CM

## 2019-02-22 DIAGNOSIS — E559 Vitamin D deficiency, unspecified: Secondary | ICD-10-CM

## 2019-02-22 DIAGNOSIS — Z9189 Other specified personal risk factors, not elsewhere classified: Secondary | ICD-10-CM

## 2019-02-22 DIAGNOSIS — R7303 Prediabetes: Secondary | ICD-10-CM | POA: Diagnosis not present

## 2019-02-22 MED ORDER — METFORMIN HCL 500 MG PO TABS: 500.0000 mg | ORAL_TABLET | Freq: Two times a day (BID) | ORAL | 0 refills | Status: DC

## 2019-02-22 MED ORDER — VITAMIN D (ERGOCALCIFEROL) 1.25 MG (50000 UNIT) PO CAPS: 50000.0000 [IU] | ORAL_CAPSULE | ORAL | 0 refills | Status: DC

## 2019-02-23 ENCOUNTER — Inpatient Hospital Stay: Payer: Medicare Other

## 2019-02-23 ENCOUNTER — Other Ambulatory Visit: Payer: Self-pay

## 2019-02-23 VITALS — BP 113/70 | HR 71 | Temp 99.0°F | Resp 20

## 2019-02-23 DIAGNOSIS — N920 Excessive and frequent menstruation with regular cycle: Secondary | ICD-10-CM | POA: Diagnosis not present

## 2019-02-23 DIAGNOSIS — D500 Iron deficiency anemia secondary to blood loss (chronic): Secondary | ICD-10-CM

## 2019-02-23 DIAGNOSIS — I10 Essential (primary) hypertension: Secondary | ICD-10-CM | POA: Diagnosis not present

## 2019-02-23 DIAGNOSIS — Z79899 Other long term (current) drug therapy: Secondary | ICD-10-CM | POA: Diagnosis not present

## 2019-02-23 LAB — CBC WITH DIFFERENTIAL (CANCER CENTER ONLY)
Abs Immature Granulocytes: 0.12 10*3/uL — ABNORMAL HIGH (ref 0.00–0.07)
Basophils Absolute: 0 10*3/uL (ref 0.0–0.1)
Basophils Relative: 0 %
Eosinophils Absolute: 0.1 10*3/uL (ref 0.0–0.5)
Eosinophils Relative: 1 %
HCT: 33.3 % — ABNORMAL LOW (ref 36.0–46.0)
Hemoglobin: 9.2 g/dL — ABNORMAL LOW (ref 12.0–15.0)
Immature Granulocytes: 1 %
Lymphocytes Relative: 20 %
Lymphs Abs: 2.1 10*3/uL (ref 0.7–4.0)
MCH: 20.4 pg — ABNORMAL LOW (ref 26.0–34.0)
MCHC: 27.6 g/dL — ABNORMAL LOW (ref 30.0–36.0)
MCV: 73.8 fL — ABNORMAL LOW (ref 80.0–100.0)
Monocytes Absolute: 1.1 10*3/uL — ABNORMAL HIGH (ref 0.1–1.0)
Monocytes Relative: 10 %
Neutro Abs: 7.1 10*3/uL (ref 1.7–7.7)
Neutrophils Relative %: 68 %
Platelet Count: 345 10*3/uL (ref 150–400)
RBC: 4.51 MIL/uL (ref 3.87–5.11)
RDW: 26.9 % — ABNORMAL HIGH (ref 11.5–15.5)
WBC Count: 10.6 10*3/uL — ABNORMAL HIGH (ref 4.0–10.5)
nRBC: 0.2 % (ref 0.0–0.2)

## 2019-02-23 LAB — FERRITIN: Ferritin: 86 ng/mL (ref 11–307)

## 2019-02-23 LAB — IRON AND TIBC
Iron: 35 ug/dL — ABNORMAL LOW (ref 41–142)
Saturation Ratios: 9 % — ABNORMAL LOW (ref 21–57)
TIBC: 390 ug/dL (ref 236–444)
UIBC: 355 ug/dL (ref 120–384)

## 2019-02-23 LAB — TYPE AND SCREEN
ABO/RH(D): O POS
Antibody Screen: NEGATIVE

## 2019-02-23 LAB — ABO/RH: ABO/RH(D): O POS

## 2019-02-23 MED ORDER — DIPHENHYDRAMINE HCL 50 MG/ML IJ SOLN
INTRAMUSCULAR | Status: AC
  Filled 2019-02-23: qty 1

## 2019-02-23 MED ORDER — SODIUM CHLORIDE 0.9 % IV SOLN
Freq: Once | INTRAVENOUS | Status: AC
  Administered 2019-02-23: 12:00:00 via INTRAVENOUS
  Filled 2019-02-23: qty 250

## 2019-02-23 MED ORDER — FAMOTIDINE IN NACL 20-0.9 MG/50ML-% IV SOLN
20.0000 mg | Freq: Once | INTRAVENOUS | Status: AC
  Administered 2019-02-23: 20 mg via INTRAVENOUS

## 2019-02-23 MED ORDER — FAMOTIDINE IN NACL 20-0.9 MG/50ML-% IV SOLN
INTRAVENOUS | Status: AC
  Filled 2019-02-23: qty 50

## 2019-02-23 MED ORDER — DIPHENHYDRAMINE HCL 50 MG/ML IJ SOLN
12.5000 mg | Freq: Once | INTRAMUSCULAR | Status: AC
  Administered 2019-02-23: 12.5 mg via INTRAVENOUS

## 2019-02-23 MED ORDER — SODIUM CHLORIDE 0.9 % IV SOLN
510.0000 mg | Freq: Once | INTRAVENOUS | Status: AC
  Administered 2019-02-23: 510 mg via INTRAVENOUS
  Filled 2019-02-23: qty 17

## 2019-02-23 NOTE — Progress Notes (Signed)
Office: [phone]  /  Fax: [phone]   HPI:   Chief Complaint: OBESITY Christine [name] is here to discuss her progress with her obesity treatment plan. She is keeping a food journal with 1600 calories and 90+ grams of protein and is following her eating plan approximately 33% of the time. She states she is walking/aerobics 5-8 minutes 1-2 times per week. Christine [name] did very well maintaining her weight. She had increased celebration eating and had a difficult time getting back on track. She states she is working on meal planning again and has a lot of questions about various meal prepping ideas.  Her weight is (!) 323 lb (146.5 kg) today and has not lost weight since her last visit. She has lost 14 lbs since starting treatment with [place].  Pre-Diabetes Christine [name] has a diagnosis of prediabetes based on her elevated Hgb A1c and was informed this puts her at greater risk of developing diabetes. Her last A1c was 5.7 on 10/07/2018. She is stable on metformin currently and continues to work on diet and exercise to decrease risk of diabetes. She denies nausea, vomiting, or hypoglycemia.  At risk for diabetes Christine [name] is at higher than averagerisk for developing diabetes due to her obesity. She currently denies polyuria or polydipsia.  Vitamin D deficiency Christine [name] has a diagnosis of Vitamin D deficiency, which is not yet at goal. She is currently stable on prescription Vit D and denies nausea, vomiting or muscle weakness.  ASSESSMENT AND PLAN:  Prediabetes - Plan: metFORMIN (GLUCOPHAGE) 500 MG tablet  Vitamin D deficiency - Plan: Vitamin D, Ergocalciferol, (DRISDOL) 1.25 MG (50000 UT) CAPS capsule  At risk for diabetes mellitus  Class 3 severe obesity with serious comorbidity and body mass index (BMI) of 50.0 to 59.9 in adult, unspecified obesity type ([place])  PLAN:  Pre-Diabetes Christine [name] will continue to work on weight loss, exercise, and decreasing simple carbohydrates in her diet to help decrease the risk  of diabetes. We dicussed metformin including benefits and risks. She was informed that eating too many simple carbohydrates or too many calories at one sitting increases the likelihood of GI side effects. Christine [name] was given a refill on her metformin 500 mg #60 with 0 refills and agrees to follow-up with our clinic in 2 weeks.  Diabetes risk counseling Christine [name] was given extended (15 minutes) diabetes prevention counseling today. She is 43 y.o. female and has risk factors for diabetes including obesity. We discussed intensive lifestyle modifications today with an emphasis on weight loss as well as increasing exercise and decreasing simple carbohydrates in her diet.  Vitamin D Deficiency Christine [name] was informed that low Vitamin D levels contributes to fatigue and are associated with obesity, breast, and colon cancer. She agrees to continue to take prescription Vit D @ 50,000 IU every week #4 with 0 refills and will follow-up for routine testing of Vitamin D, at least 2-3 times per year. She was informed of the risk of over-replacement of Vitamin D and agrees to not increase her dose unless she discusses this with [place] first. Christine [name] agrees to follow-up with our clinic in 2 weeks.  Obesity Christine [name] is currently in the action stage of change. As such, her goal is to continue with weight loss efforts. She has agreed to keep a food journal with 1600 calories and 90 grams of protein.  Christine [name] has been instructed to work up to a goal of 150 minutes of combined cardio and strengthening exercise per week for weight loss and overall health benefits. We  discussed the following Behavioral Modification Strategies today: increasing lean protein intake, decreasing simple carbohydrates, work on meal planning and easy cooking plans.  Christine [name] has agreed to follow-up with our clinic in 2 weeks. She was informed of the importance of frequent follow-up visits to maximize her success with intensive lifestyle modifications for her multiple  health conditions.  ALLERGIES: Allergies  Allergen Reactions  . Penicillins Rash and Other (See Comments)    Has patient had a PCN reaction causing immediate rash, facial/tongue/throat swelling, SOB or lightheadedness with hypotension: Yes Has patient had a PCN reaction causing severe rash involving mucus membranes or skin necrosis: Unknown Has patient had a PCN reaction that required hospitalization: No Has patient had a PCN reaction occurring within the last 10 years: No If all of the above answers are "NO", then may proceed with Cephalosporin use.     MEDICATIONS: Current Outpatient Medications on File Prior to Visit  Medication Sig Dispense Refill  . amLODipine (NORVASC) 10 MG tablet Take 1 tablet (10 mg total) by mouth daily. 30 tablet 0  . ARIPiprazole (ABILIFY) 15 MG tablet Take 1 tablet (15 mg total) by mouth daily. 30 tablet 2  . clonazePAM (KLONOPIN) 0.5 MG tablet Take 1 tablet (0.5 mg total) by mouth daily as needed for anxiety. 30 tablet 0  . Erenumab-aooe (AIMOVIG) 140 MG/ML SOAJ Inject 140 mg into the skin every 30 (thirty) days. 1 pen 11  . lamoTRIgine (LAMICTAL) 150 MG tablet Take 1 tablet (150 mg total) by mouth 2 (two) times daily. For mood 60 tablet 2  . lithium carbonate 300 MG capsule Take 2 capsule in AM and 3 capsule at bed time 150 capsule 2  . metoprolol succinate (TOPROL-XL) 50 MG 24 hr tablet Take 50 mg by mouth daily. Take with or immediately following a meal.    . metoprolol tartrate (LOPRESSOR) 50 MG tablet Take 1 tablet (50 mg total) by mouth once for 1 dose. TAKE TWO HOUR PRIOR TO  SCHEDULE CARDAIC TEST 1 tablet 0   No current facility-administered medications on file prior to visit.     PAST MEDICAL HISTORY: Past Medical History:  Diagnosis Date  . Anemia    Presumably from menorrhagia/menomenorrhagia; has had both blood and iron transfusions  . Anxiety   . Back pain   . Bipolar 1 disorder ([place])    With depression  . Depression   . Essential  hypertension   . Fibromyalgia   . Headache   . History of blood transfusion 08/2017   [facility]  . History of hiatal hernia   . Infertility, female   . Lactose intolerance   . Lower extremity edema   . Migraines   . Morbid obesity ([place])   . Osteoarthritis    Hands, ankles; HLA-B27 positive  . PCOS (polycystic ovarian syndrome)   . Seasonal allergies    With recurrent allergic rhinitis  . Uterine leiomyoma   . Vitamin D deficiency     PAST SURGICAL HISTORY: Past Surgical History:  Procedure Laterality Date  . CERVICAL POLYPECTOMY  01/15/2018   Procedure: CERVICAL POLYPECTOMY;  Surgeon: Arvella Nigh, MD;  Location: [facility];  Service: Gynecology;;  . COLONOSCOPY  2008   Normal  . DIAGNOSTIC LAPAROSCOPY  08/1999   dermoid cyst, RSO  . DILATION AND CURETTAGE OF UTERUS  05/2004   MAB  . DILATION AND CURETTAGE OF UTERUS  04/2015  . HYSTEROSCOPY W/D&C  07/22/2011   Procedure: DILATATION AND CURETTAGE /HYSTEROSCOPY;  Surgeon: Cyril Mourning,  MD;  Location: [facility];  Service: Gynecology;;  . HYSTEROSCOPY W/D&C N/A 01/15/2018   Procedure: DILATATION AND CURETTAGE [place] WITH MYOSURE;  Surgeon: Arvella Nigh, MD;  Location: [facility];  Service: Gynecology;  Laterality: N/A;  . 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 TRANSTHORACIC ECHOCARDIOGRAM  08/2017    EF 65 to 70% with vigorous wall motion.  Suggestion of high cardiac output (possibly related to anemia thyrotoxicosis, Pregnancy, sepsis etc.) -- was in setting of symptomatic anemia - Hgb 5.7    SOCIAL HISTORY: Social History   Tobacco Use  . Smoking status: Never Smoker  . Smokeless tobacco: Never Used  Substance Use Topics  . Alcohol use: Yes    Comment: occasional, maybe once a month or so  . Drug use: No    FAMILY HISTORY: Family History  Problem Relation Age of Onset  . Anxiety disorder Mother   . Depression Mother   . Cancer Mother        pancreatic   . High blood pressure Mother   . Hypertension Mother   .  Hyperlipidemia Mother   . Cancer Father        colon  . High blood pressure Father   . Diabetes Father   . Hypertension Father   . Hyperlipidemia Father   . Heart disease Father   . Stroke Father   . Kidney disease Father   . Sleep apnea Father   . Obesity Father   . Cancer Brother 6       leukemia, childhood  . High blood pressure Brother   . Migraines Neg Hx    ROS: Review of Systems  Gastrointestinal: Negative for nausea and vomiting.  Musculoskeletal:       Negative for muscle weakness.  Endo/Heme/Allergies:       Negative for hypoglycemia.   PHYSICAL EXAM: Blood pressure 124/71, pulse 91, temperature 98.6 F (37 C), temperature source Oral, height '5\' 4"'$  (1.626 m), weight (!) 323 lb (146.5 kg), last menstrual period 01/31/2019, SpO2 99 %. Body mass index is 55.44 kg/m. Physical Exam Vitals signs reviewed.  Constitutional:      Appearance: Normal appearance. She is obese.  Cardiovascular:     Rate and Rhythm: Normal rate.     Pulses: Normal pulses.  Pulmonary:     Effort: Pulmonary effort is normal.     Breath sounds: Normal breath sounds.  Musculoskeletal: Normal range of motion.  Skin:    General: Skin is warm and dry.  Neurological:     Mental Status: She is alert and oriented to person, place, and time.  Psychiatric:        Behavior: Behavior normal.   RECENT LABS AND TESTS: BMET    Component Value Date/Time   NA 140 02/16/2019 0835   NA 138 10/07/2018 1219   K 3.7 02/16/2019 0835   CL 108 02/16/2019 0835   CO2 22 02/16/2019 0835   GLUCOSE 119 (H) 02/16/2019 0835   BUN 13 02/16/2019 0835   BUN 10 10/07/2018 1219   CREATININE 0.87 02/16/2019 0835   CALCIUM 8.9 02/16/2019 0835   GFRNONAA >60 02/16/2019 0835   GFRAA >60 02/16/2019 0835   Lab Results  Component Value Date   HGBA1C 5.7 (H) 10/07/2018   Lab Results  Component Value Date   INSULIN 19.5 10/07/2018   CBC    Component Value Date/Time   WBC 10.6 (H) 02/23/2019 1051   WBC 9.6  01/30/2019 1519   RBC 4.51  02/23/2019 1051   HGB 9.2 (L) 02/23/2019 1051   HCT 33.3 (L) 02/23/2019 1051   PLT 345 02/23/2019 1051   MCV 73.8 (L) 02/23/2019 1051   MCH 20.4 (L) 02/23/2019 1051   MCHC 27.6 (L) 02/23/2019 1051   RDW 26.9 (H) 02/23/2019 1051   LYMPHSABS 2.1 02/23/2019 1051   MONOABS 1.1 (H) 02/23/2019 1051   EOSABS 0.1 02/23/2019 1051   BASOSABS 0.0 02/23/2019 1051   Iron/TIBC/Ferritin/ %Sat    Component Value Date/Time   IRON 35 (L) 02/23/2019 1051   TIBC 390 02/23/2019 1051   FERRITIN 86 02/23/2019 1051   IRONPCTSAT 9 (L) 02/23/2019 1051   Lipid Panel     Component Value Date/Time   CHOL 183 10/07/2018 1220   TRIG 70 10/07/2018 1220   HDL 38 (L) 10/07/2018 1220   LDLCALC 131 (H) 10/07/2018 1220   Hepatic Function Panel     Component Value Date/Time   PROT 7.3 02/16/2019 0835   PROT 7.0 10/07/2018 1219   ALBUMIN 3.8 02/16/2019 0835   ALBUMIN 4.2 10/07/2018 1219   AST 9 (L) 02/16/2019 0835   ALT 14 02/16/2019 0835   ALKPHOS 66 02/16/2019 0835   BILITOT 0.4 02/16/2019 0835      Component Value Date/Time   TSH 1.260 10/07/2018 1219   TSH 1.460 04/28/2018 1130   Results for HOLLEE, FATE (MRN [id number]) as of 02/23/2019 14:26  Ref. Range 10/07/2018 12:19  Vitamin D, 25-Hydroxy Latest Ref Range: 30.0 - 100.0 ng/mL 7.0 (L)   OBESITY BEHAVIORAL INTERVENTION VISIT  Today's visit was #9  Starting weight: 337 lbs Starting date: 10/07/2018 Today's weight: 323 lbs  Today's date: 02/22/2019 Total lbs lost to date: 14   02/22/2019  Height '5\' 4"'$  (1.626 m)  Weight 323 lb (146.5 kg) (A)  BMI (Calculated) 55.42  BLOOD PRESSURE - SYSTOLIC 599  BLOOD PRESSURE - DIASTOLIC 71   Body Fat % 23.4 %   ASK: We discussed the diagnosis of obesity with Louis Meckel today and Christine [name] agreed to give [place] permission to discuss obesity behavioral modification therapy today.  ASSESS: Christine [name] has the diagnosis of obesity and her BMI today is 55.5. Christine [name] is in the  action stage of change.   ADVISE: Christine [name] was educated on the multiple health risks of obesity as well as the benefit of weight loss to improve her health. She was advised of the need for long term treatment and the importance of lifestyle modifications to improve her current health and to decrease her risk of future health problems.  AGREE: Multiple dietary modification options and treatment options were discussed and  Christine [name] agreed to follow the recommendations documented in the above note.  ARRANGE: Christine [name] was educated on the importance of frequent visits to treat obesity as outlined per CMS and USPSTF guidelines and agreed to schedule her next follow up appointment today.  I, Michaelene Song, am acting as Location manager for Dennard Nip, MD  I have reviewed the above documentation for accuracy and completeness, and I agree with the above. -Dennard Nip, MD

## 2019-02-23 NOTE — Patient Instructions (Signed)
Ferumoxytol injection What is this medicine? FERUMOXYTOL is an iron complex. Iron is used to make healthy red blood cells, which carry oxygen and nutrients throughout the body. This medicine is used to treat iron deficiency anemia. This medicine may be used for other purposes; ask your health care provider or pharmacist if you have questions. COMMON BRAND NAME(S): Feraheme What should I tell my health care provider before I take this medicine? They need to know if you have any of these conditions:  anemia not caused by low iron levels  high levels of iron in the blood  magnetic resonance imaging (MRI) test scheduled  an unusual or allergic reaction to iron, other medicines, foods, dyes, or preservatives  pregnant or trying to get pregnant  breast-feeding How should I use this medicine? This medicine is for injection into a vein. It is given by a health care professional in a hospital or clinic setting. Talk to your pediatrician regarding the use of this medicine in children. Special care may be needed. Overdosage: If you think you have taken too much of this medicine contact a poison control center or emergency room at once. NOTE: This medicine is only for you. Do not share this medicine with others. What if I miss a dose? It is important not to miss your dose. Call your doctor or health care professional if you are unable to keep an appointment. What may interact with this medicine? This medicine may interact with the following medications:  other iron products This list may not describe all possible interactions. Give your health care provider a list of all the medicines, herbs, non-prescription drugs, or dietary supplements you use. Also tell them if you smoke, drink alcohol, or use illegal drugs. Some items may interact with your medicine. What should I watch for while using this medicine? Visit your doctor or healthcare professional regularly. Tell your doctor or healthcare  professional if your symptoms do not start to get better or if they get worse. You may need blood work done while you are taking this medicine. You may need to follow a special diet. Talk to your doctor. Foods that contain iron include: whole grains/cereals, dried fruits, beans, or peas, leafy green vegetables, and organ meats (liver, kidney). What side effects may I notice from receiving this medicine? Side effects that you should report to your doctor or health care professional as soon as possible:  allergic reactions like skin rash, itching or hives, swelling of the face, lips, or tongue  breathing problems  changes in blood pressure  feeling faint or lightheaded, falls  fever or chills  flushing, sweating, or hot feelings  swelling of the ankles or feet Side effects that usually do not require medical attention (report to your doctor or health care professional if they continue or are bothersome):  diarrhea  headache  nausea, vomiting  stomach pain This list may not describe all possible side effects. Call your doctor for medical advice about side effects. You may report side effects to FDA at 1-800-FDA-1088. Where should I keep my medicine? This drug is given in a hospital or clinic and will not be stored at home. NOTE: This sheet is a summary. It may not cover all possible information. If you have questions about this medicine, talk to your doctor, pharmacist, or health care provider.  2020 Elsevier/Gold Standard (2016-09-09 20:21:10) Coronavirus (COVID-19) Are you at risk?  Are you at risk for the Coronavirus (COVID-19)?  To be considered HIGH RISK for Coronavirus (COVID-19),   you have to meet the following criteria:  . Traveled to [place], [place], [place], [place] or [place]; or in the [place] to [place], [place], [place], or [place]; and have fever, cough, and shortness of breath within the last 2 weeks of travel OR . Been in close contact with a person  diagnosed with COVID-19 within the last 2 weeks and have fever, cough, and shortness of breath . IF YOU DO NOT MEET THESE CRITERIA, YOU ARE CONSIDERED LOW RISK FOR COVID-19.  What to do if you are HIGH RISK for COVID-19?  . If you are having a medical emergency, call 911. . Seek medical care right away. Before you go to a doctor's office, urgent care or emergency department, call ahead and tell them about your recent travel, contact with someone diagnosed with COVID-19, and your symptoms. You should receive instructions from your physician's office regarding next steps of care.  . When you arrive at healthcare provider, tell the healthcare staff immediately you have returned from visiting [place], [place], [place], [place] or [place]; or traveled in the [place] to [place], [place], [place], or [place]; in the last two weeks or you have been in close contact with a person diagnosed with COVID-19 in the last 2 weeks.   . Tell the health care staff about your symptoms: fever, cough and shortness of breath. . After you have been seen by a medical provider, you will be either: o Tested for (COVID-19) and discharged home on quarantine except to seek medical care if symptoms worsen, and asked to  - Stay home and avoid contact with others until you get your results (4-5 days)  - Avoid travel on public transportation if possible (such as bus, train, or airplane) or o Sent to the Emergency Department by EMS for evaluation, COVID-19 testing, and possible admission depending on your condition and test results.  What to do if you are LOW RISK for COVID-19?  Reduce your risk of any infection by using the same precautions used for avoiding the common cold or flu:  . Wash your hands often with soap and warm water for at least 20 seconds.  If soap and water are not readily available, use an alcohol-based hand sanitizer with at least 60% alcohol.  . If coughing or sneezing, cover your mouth and nose by  coughing or sneezing into the elbow areas of your shirt or coat, into a tissue or into your sleeve (not your hands). . Avoid shaking hands with others and consider head nods or verbal greetings only. . Avoid touching your eyes, nose, or mouth with unwashed hands.  . Avoid close contact with people who are sick. . Avoid places or events with large numbers of people in one location, like concerts or sporting events. . Carefully consider travel plans you have or are making. . If you are planning any travel outside or inside the [place], visit the CDC's Travelers' Health webpage for the latest health notices. . If you have some symptoms but not all symptoms, continue to monitor at home and seek medical attention if your symptoms worsen. . If you are having a medical emergency, call 911.   ADDITIONAL HEALTHCARE OPTIONS FOR PATIENTS  [facility] / e-Visit: [email or web address]         [facility]: [phone]  [facility]: [phone]                   [facility]: [phone]   

## 2019-02-23 NOTE — Progress Notes (Signed)
Per Sindy Guadeloupe, NP: okay to hold blood transfusion and infuse only IV Feraheme today. Patient's Hgb is 9.2. Patient informed and verbalized understanding.

## 2019-02-25 ENCOUNTER — Telehealth: Payer: Self-pay | Admitting: Emergency Medicine

## 2019-02-25 NOTE — Telephone Encounter (Signed)
-----   Message from Jonnie Finner, RN sent at 02/25/2019  9:09 AM EDT -----  ----- Message ----- From: Alla Feeling, NP Sent: 02/23/2019   1:50 PM EDT To: [facility]  Please let her know iron labs. She is responding well to IV Feraheme.  Thanks, Regan Rakers

## 2019-02-25 NOTE — Telephone Encounter (Signed)
Spoke with pt regarding improved iron labs from 02/23/2019.  Pt verbalized understanding of results and of next CC appt dates/times.  Denies any further questions or concerns at this time.

## 2019-03-06 HISTORY — PX: OTHER SURGICAL HISTORY: SHX169

## 2019-03-08 ENCOUNTER — Ambulatory Visit (INDEPENDENT_AMBULATORY_CARE_PROVIDER_SITE_OTHER): Payer: Medicare Other | Admitting: Neurology

## 2019-03-08 DIAGNOSIS — Z72821 Inadequate sleep hygiene: Secondary | ICD-10-CM

## 2019-03-08 DIAGNOSIS — G4731 Primary central sleep apnea: Secondary | ICD-10-CM

## 2019-03-08 DIAGNOSIS — G4739 Other sleep apnea: Secondary | ICD-10-CM

## 2019-03-08 DIAGNOSIS — F5104 Psychophysiologic insomnia: Secondary | ICD-10-CM

## 2019-03-08 DIAGNOSIS — G4733 Obstructive sleep apnea (adult) (pediatric): Secondary | ICD-10-CM

## 2019-03-08 DIAGNOSIS — E282 Polycystic ovarian syndrome: Secondary | ICD-10-CM

## 2019-03-08 DIAGNOSIS — I10 Essential (primary) hypertension: Secondary | ICD-10-CM

## 2019-03-08 DIAGNOSIS — G43709 Chronic migraine without aura, not intractable, without status migrainosus: Secondary | ICD-10-CM

## 2019-03-08 DIAGNOSIS — E662 Morbid (severe) obesity with alveolar hypoventilation: Secondary | ICD-10-CM

## 2019-03-08 DIAGNOSIS — Z6841 Body Mass Index (BMI) 40.0 and over, adult: Secondary | ICD-10-CM

## 2019-03-08 DIAGNOSIS — D62 Acute posthemorrhagic anemia: Secondary | ICD-10-CM

## 2019-03-10 ENCOUNTER — Encounter (INDEPENDENT_AMBULATORY_CARE_PROVIDER_SITE_OTHER): Payer: Self-pay | Admitting: Family Medicine

## 2019-03-10 ENCOUNTER — Telehealth (INDEPENDENT_AMBULATORY_CARE_PROVIDER_SITE_OTHER): Payer: Medicare Other | Admitting: Family Medicine

## 2019-03-10 ENCOUNTER — Other Ambulatory Visit: Payer: Self-pay

## 2019-03-10 DIAGNOSIS — R7303 Prediabetes: Secondary | ICD-10-CM | POA: Diagnosis not present

## 2019-03-10 DIAGNOSIS — E559 Vitamin D deficiency, unspecified: Secondary | ICD-10-CM

## 2019-03-10 DIAGNOSIS — Z6841 Body Mass Index (BMI) 40.0 and over, adult: Secondary | ICD-10-CM

## 2019-03-10 MED ORDER — METFORMIN HCL 500 MG PO TABS: 500.0000 mg | ORAL_TABLET | Freq: Two times a day (BID) | ORAL | 0 refills | Status: DC

## 2019-03-10 MED ORDER — VITAMIN D (ERGOCALCIFEROL) 1.25 MG (50000 UNIT) PO CAPS: 50000.0000 [IU] | ORAL_CAPSULE | ORAL | 0 refills | Status: DC

## 2019-03-11 NOTE — Progress Notes (Signed)
Office: [phone]  /  Fax: [phone] TeleHealth Visit:  Christine [name] has verbally consented to this TeleHealth visit today. The patient is located at home, the provider is located at the [facility] office. The participants in this visit include the listed provider and patient. The visit was conducted today via [email or web address].  HPI:   Chief Complaint: OBESITY Christine [name] is here to discuss her progress with her obesity treatment plan. She is on the keep a food journal with 1600 calories and 90 grams of protein daily and is following her eating plan approximately 20 % of the time. She states she is exercising 0 minutes 0 times per week. Christine [name] states she has gotten off track and she thinks she has gained a couple of lbs in the last 2-3 weeks. She has not been journaling much, but she has tried to be mindful of her food choices.  We were unable to weigh the patient today for this TeleHealth visit. She feels as if she has gained weight since her last visit. She has lost 14 lbs since starting treatment with [place].  Vitamin D Deficiency Christine [name] has a diagnosis of vitamin D deficiency. She is stable on prescription Vit D and denies nausea, vomiting or muscle weakness.  Pre-Diabetes Christine [name] has a diagnosis of pre-diabetes based on her elevated Hgb A1c and was informed this puts her at greater risk of developing diabetes. She has been off track with her diet and her sugar intake has increased in the last 2 weeks. She is taking metformin currently and continues to work on diet and exercise to decrease risk of diabetes. She denies polyphagia.  ASSESSMENT AND PLAN:  Class 3 severe obesity with serious comorbidity and body mass index (BMI) of 50.0 to 59.9 in adult, unspecified obesity type (HCC)  Prediabetes - Plan: metFORMIN (GLUCOPHAGE) 500 MG tablet  Vitamin D deficiency - Plan: Vitamin D, Ergocalciferol, (DRISDOL) 1.25 MG (50000 UT) CAPS capsule  PLAN:  Vitamin D Deficiency Christine [name] was  informed that low vitamin D levels contributes to fatigue and are associated with obesity, breast, and colon cancer. Christine [name] agrees to continue taking prescription Vit D 50,000 IU every week #4 and we will refill for 1 month. She will follow up for routine testing of vitamin D, at least 2-3 times per year. She was informed of the risk of over-replacement of vitamin D and agrees to not increase her dose unless she discusses this with [place] first. Christine [name] agrees to follow up with our clinic in 3 weeks.  Pre-Diabetes Christine [name] will continue to work on weight loss, exercise, and decreasing simple carbohydrates in her diet to help decrease the risk of diabetes. We dicussed metformin including benefits and risks. She was informed that eating too many simple carbohydrates or too many calories at one sitting increases the likelihood of GI side effects. Christine [name] agrees to continue taking metformin 500 mg BID #60 and we will refill for 1 month. Christine [name] agrees to follow up with our clinic in 3 weeks as directed to monitor her progress.  Obesity Christine [name] is currently in the action stage of change. As such, her goal is to continue with weight loss efforts She has agreed to keep a food journal with 1200-1500 calories and 85+ grams of protein daily Christine [name] has been instructed to work up to a goal of 150 minutes of combined cardio and strengthening exercise per week for weight loss and overall health benefits. We discussed the following Behavioral Modification Strategies today: increasing lean protein intake,  emotional eating strategies and decrease junk food   Christine [name] has agreed to follow up with our clinic in 3 weeks. She was informed of the importance of frequent follow up visits to maximize her success with intensive lifestyle modifications for her multiple health conditions.  ALLERGIES: Allergies  Allergen Reactions  . Penicillins Rash and Other (See Comments)    Has patient had a PCN reaction causing immediate rash,  facial/tongue/throat swelling, SOB or lightheadedness with hypotension: Yes Has patient had a PCN reaction causing severe rash involving mucus membranes or skin necrosis: Unknown Has patient had a PCN reaction that required hospitalization: No Has patient had a PCN reaction occurring within the last 10 years: No If all of the above answers are "NO", then may proceed with Cephalosporin use.     MEDICATIONS: Current Outpatient Medications on File Prior to Visit  Medication Sig Dispense Refill  . amLODipine (NORVASC) 10 MG tablet Take 1 tablet (10 mg total) by mouth daily. 30 tablet 0  . ARIPiprazole (ABILIFY) 15 MG tablet Take 1 tablet (15 mg total) by mouth daily. 30 tablet 2  . clonazePAM (KLONOPIN) 0.5 MG tablet Take 1 tablet (0.5 mg total) by mouth daily as needed for anxiety. 30 tablet 0  . Erenumab-aooe (AIMOVIG) 140 MG/ML SOAJ Inject 140 mg into the skin every 30 (thirty) days. 1 pen 11  . lamoTRIgine (LAMICTAL) 150 MG tablet Take 1 tablet (150 mg total) by mouth 2 (two) times daily. For mood 60 tablet 2  . lithium carbonate 300 MG capsule Take 2 capsule in AM and 3 capsule at bed time 150 capsule 2  . metoprolol succinate (TOPROL-XL) 50 MG 24 hr tablet Take 50 mg by mouth daily. Take with or immediately following a meal.    . metoprolol tartrate (LOPRESSOR) 50 MG tablet Take 1 tablet (50 mg total) by mouth once for 1 dose. TAKE TWO HOUR PRIOR TO  SCHEDULE CARDAIC TEST 1 tablet 0   No current facility-administered medications on file prior to visit.     PAST MEDICAL HISTORY: Past Medical History:  Diagnosis Date  . Anemia    Presumably from menorrhagia/menomenorrhagia; has had both blood and iron transfusions  . Anxiety   . Back pain   . Bipolar 1 disorder ([place])    With depression  . Depression   . Essential hypertension   . Fibromyalgia   . Headache   . History of blood transfusion 08/2017   [facility]  . History of hiatal hernia   . Infertility, female   . Lactose intolerance    . Lower extremity edema   . Migraines   . Morbid obesity ([place])   . Osteoarthritis    Hands, ankles; HLA-B27 positive  . PCOS (polycystic ovarian syndrome)   . Seasonal allergies    With recurrent allergic rhinitis  . Uterine leiomyoma   . Vitamin D deficiency     PAST SURGICAL HISTORY: Past Surgical History:  Procedure Laterality Date  . CERVICAL POLYPECTOMY  01/15/2018   Procedure: CERVICAL POLYPECTOMY;  Surgeon: Arvella Nigh, MD;  Location: [facility];  Service: Gynecology;;  . COLONOSCOPY  2008   Normal  . DIAGNOSTIC LAPAROSCOPY  08/1999   dermoid cyst, [facility]  . DILATION AND CURETTAGE OF UTERUS  05/2004   [facility]  . DILATION AND CURETTAGE OF UTERUS  04/2015  . HYSTEROSCOPY W/D&C  07/22/2011   Procedure: DILATATION AND CURETTAGE /HYSTEROSCOPY;  Surgeon: Cyril Mourning, MD;  Location: [facility];  Service: Gynecology;;  . HYSTEROSCOPY W/D&C  N/A 01/15/2018   Procedure: DILATATION AND CURETTAGE /HYSTEROSCOPY WITH MYOSURE;  Surgeon: Arvella Nigh, MD;  Location: [facility];  Service: Gynecology;  Laterality: N/A;  . 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 TRANSTHORACIC ECHOCARDIOGRAM  08/2017    EF 65 to 70% with vigorous wall motion.  Suggestion of high cardiac output (possibly related to anemia thyrotoxicosis, Pregnancy, sepsis etc.) -- was in setting of symptomatic anemia - Hgb 5.7    SOCIAL HISTORY: Social History   Tobacco Use  . Smoking status: Never Smoker  . Smokeless tobacco: Never Used  Substance Use Topics  . Alcohol use: Yes    Comment: occasional, maybe once a month or so  . Drug use: No    FAMILY HISTORY: Family History  Problem Relation Age of Onset  . Anxiety disorder Mother   . Depression Mother   . Cancer Mother        pancreatic   . High blood pressure Mother   . Hypertension Mother   . Hyperlipidemia Mother   . Cancer Father        colon  . High blood pressure Father   . Diabetes Father   . Hypertension Father   . Hyperlipidemia Father   . Heart  disease Father   . Stroke Father   . Kidney disease Father   . Sleep apnea Father   . Obesity Father   . Cancer Brother 6       leukemia, childhood  . High blood pressure Brother   . Migraines Neg Hx     ROS: Review of Systems  Constitutional: Negative for weight loss.  Gastrointestinal: Negative for nausea and vomiting.  Musculoskeletal:       Negative muscle weakness  Endo/Heme/Allergies:       Positive polyphagia    PHYSICAL EXAM: Pt in no acute distress  RECENT LABS AND TESTS: BMET    Component Value Date/Time   NA 140 02/16/2019 0835   NA 138 10/07/2018 1219   K 3.7 02/16/2019 0835   CL 108 02/16/2019 0835   CO2 22 02/16/2019 0835   GLUCOSE 119 (H) 02/16/2019 0835   BUN 13 02/16/2019 0835   BUN 10 10/07/2018 1219   CREATININE 0.87 02/16/2019 0835   CALCIUM 8.9 02/16/2019 0835   GFRNONAA >60 02/16/2019 0835   GFRAA >60 02/16/2019 0835   Lab Results  Component Value Date   HGBA1C 5.7 (H) 10/07/2018   Lab Results  Component Value Date   INSULIN 19.5 10/07/2018   CBC    Component Value Date/Time   WBC 10.6 (H) 02/23/2019 1051   WBC 9.6 01/30/2019 1519   RBC 4.51 02/23/2019 1051   HGB 9.2 (L) 02/23/2019 1051   HCT 33.3 (L) 02/23/2019 1051   PLT 345 02/23/2019 1051   MCV 73.8 (L) 02/23/2019 1051   MCH 20.4 (L) 02/23/2019 1051   MCHC 27.6 (L) 02/23/2019 1051   RDW 26.9 (H) 02/23/2019 1051   LYMPHSABS 2.1 02/23/2019 1051   MONOABS 1.1 (H) 02/23/2019 1051   EOSABS 0.1 02/23/2019 1051   BASOSABS 0.0 02/23/2019 1051   Iron/TIBC/Ferritin/ %Sat    Component Value Date/Time   IRON 35 (L) 02/23/2019 1051   TIBC 390 02/23/2019 1051   FERRITIN 86 02/23/2019 1051   IRONPCTSAT 9 (L) 02/23/2019 1051   Lipid Panel     Component Value Date/Time   CHOL 183 10/07/2018 1220   TRIG 70 10/07/2018 1220   HDL 38 (L) 10/07/2018 1220  LDLCALC 131 (H) 10/07/2018 1220   Hepatic Function Panel     Component Value Date/Time   PROT 7.3 02/16/2019 0835   PROT  7.0 10/07/2018 1219   ALBUMIN 3.8 02/16/2019 0835   ALBUMIN 4.2 10/07/2018 1219   AST 9 (L) 02/16/2019 0835   ALT 14 02/16/2019 0835   ALKPHOS 66 02/16/2019 0835   BILITOT 0.4 02/16/2019 0835      Component Value Date/Time   TSH 1.260 10/07/2018 1219   TSH 1.460 04/28/2018 1130      I, Trixie Dredge, am acting as transcriptionist for Dennard Nip, MD I have reviewed the above documentation for accuracy and completeness, and I agree with the above. -Dennard Nip, MD

## 2019-03-17 ENCOUNTER — Telehealth (HOSPITAL_COMMUNITY): Payer: Self-pay | Admitting: Emergency Medicine

## 2019-03-17 NOTE — Telephone Encounter (Signed)
Reaching out to patient to offer assistance regarding upcoming cardiac imaging study; pt verbalizes understanding of appt date/time, parking situation and where to check in, pre-test NPO status and medications ordered, and verified current allergies; name and call back number provided for further questions should they arise Kristoff Coonradt RN Navigator Cardiac Imaging [facility] [phone] office [phone] cell  Pt denies covid symptoms, verbalized understanding of visitor policy. 

## 2019-03-18 ENCOUNTER — Ambulatory Visit (HOSPITAL_COMMUNITY)
Admission: RE | Admit: 2019-03-18 | Discharge: 2019-03-18 | Disposition: A | Discharge status: Home / Self Care | Payer: Medicare Other | Source: Ambulatory Visit | Attending: Cardiology | Admitting: Cardiology

## 2019-03-18 ENCOUNTER — Encounter (HOSPITAL_COMMUNITY): Payer: Self-pay

## 2019-03-18 ENCOUNTER — Encounter: Payer: BC Managed Care – PPO | Admitting: *Deleted

## 2019-03-18 ENCOUNTER — Other Ambulatory Visit: Payer: Self-pay

## 2019-03-18 ENCOUNTER — Ambulatory Visit (HOSPITAL_COMMUNITY): Payer: Medicare Other

## 2019-03-18 DIAGNOSIS — R0789 Other chest pain: Secondary | ICD-10-CM | POA: Insufficient documentation

## 2019-03-18 DIAGNOSIS — I272 Pulmonary hypertension, unspecified: Secondary | ICD-10-CM | POA: Diagnosis not present

## 2019-03-18 DIAGNOSIS — R072 Precordial pain: Secondary | ICD-10-CM | POA: Diagnosis not present

## 2019-03-18 DIAGNOSIS — Z01818 Encounter for other preprocedural examination: Secondary | ICD-10-CM | POA: Diagnosis not present

## 2019-03-18 DIAGNOSIS — Z006 Encounter for examination for normal comparison and control in clinical research program: Secondary | ICD-10-CM

## 2019-03-18 MED ORDER — METOPROLOL TARTRATE 5 MG/5ML IV SOLN
INTRAVENOUS | Status: AC
  Administered 2019-03-18: 5 mg via INTRAVENOUS
  Filled 2019-03-18: qty 20

## 2019-03-18 MED ORDER — NITROGLYCERIN 0.4 MG SL SUBL
SUBLINGUAL_TABLET | SUBLINGUAL | Status: AC
  Filled 2019-03-18: qty 2

## 2019-03-18 MED ORDER — METOPROLOL TARTRATE 5 MG/5ML IV SOLN
5.0000 mg | INTRAVENOUS | Status: AC | PRN
  Administered 2019-03-18 (×4): 5 mg via INTRAVENOUS
  Filled 2019-03-18 (×5): qty 5

## 2019-03-18 MED ORDER — NITROGLYCERIN 0.4 MG SL SUBL
0.8000 mg | SUBLINGUAL_TABLET | Freq: Once | SUBLINGUAL | Status: DC
  Filled 2019-03-18: qty 25

## 2019-03-18 NOTE — Progress Notes (Signed)
Patient heart rate still in 80s after 20mg  of IV metoprolol, patient also took 50mg  prior to scan at home. BP 130s/90s. Patient is extremely anxious and tearful, stating she "cant relax" and probably should have taken anti-anxiety medication prior to CT scan. Dr. Margaretann Loveless informed of patient blood pressure and anxiety. MD suggests giving patient IV cardizem but only if patient can stay calm for scan or rescheduling scan for another day. Patient informed of MD plan and states she will feel better about rescheduling exam so that she can take her anti-anxiety medications prior to scan. CT heart navigator and Dr. Margaretann Loveless informed. IV removed, snack given and patient escorted back to heart and vascular entrance.

## 2019-03-18 NOTE — Research (Signed)
CADFEM Informed Consent                  Subject Name:   Christine [name]  Subject met inclusion and exclusion criteria.  The informed consent form, study requirements and expectations were reviewed with the subject and questions and concerns were addressed prior to the signing of the consent form.  The subject verbalized understanding of the trial requirements.  The subject agreed to participate in the CADFEM trial and signed the informed consent.  The informed consent was obtained prior to performance of any protocol-specific procedures for the subject.  A copy of the signed informed consent was given to the subject and a copy was placed in the subject's medical record.   [place] Chalmers, Research Assistant  03/18/2019 14:23 p.m.

## 2019-03-19 ENCOUNTER — Inpatient Hospital Stay: Payer: Medicare Other

## 2019-03-19 ENCOUNTER — Inpatient Hospital Stay: Payer: Medicare Other | Attending: Nurse Practitioner

## 2019-03-19 ENCOUNTER — Other Ambulatory Visit: Payer: Self-pay

## 2019-03-19 VITALS — BP 121/69 | HR 68 | Temp 97.8°F | Resp 17

## 2019-03-19 DIAGNOSIS — D500 Iron deficiency anemia secondary to blood loss (chronic): Secondary | ICD-10-CM

## 2019-03-19 DIAGNOSIS — Z79899 Other long term (current) drug therapy: Secondary | ICD-10-CM | POA: Diagnosis not present

## 2019-03-19 DIAGNOSIS — N920 Excessive and frequent menstruation with regular cycle: Secondary | ICD-10-CM | POA: Diagnosis not present

## 2019-03-19 DIAGNOSIS — I10 Essential (primary) hypertension: Secondary | ICD-10-CM | POA: Diagnosis not present

## 2019-03-19 LAB — FERRITIN: Ferritin: 37 ng/mL (ref 11–307)

## 2019-03-19 LAB — CBC WITH DIFFERENTIAL (CANCER CENTER ONLY)
Abs Immature Granulocytes: 0.03 10*3/uL (ref 0.00–0.07)
Basophils Absolute: 0 10*3/uL (ref 0.0–0.1)
Basophils Relative: 1 %
Eosinophils Absolute: 0.1 10*3/uL (ref 0.0–0.5)
Eosinophils Relative: 1 %
HCT: 37.6 % (ref 36.0–46.0)
Hemoglobin: 11.1 g/dL — ABNORMAL LOW (ref 12.0–15.0)
Immature Granulocytes: 1 %
Lymphocytes Relative: 26 %
Lymphs Abs: 1.7 10*3/uL (ref 0.7–4.0)
MCH: 24.1 pg — ABNORMAL LOW (ref 26.0–34.0)
MCHC: 29.5 g/dL — ABNORMAL LOW (ref 30.0–36.0)
MCV: 81.7 fL (ref 80.0–100.0)
Monocytes Absolute: 0.5 10*3/uL (ref 0.1–1.0)
Monocytes Relative: 8 %
Neutro Abs: 4.1 10*3/uL (ref 1.7–7.7)
Neutrophils Relative %: 63 %
Platelet Count: 317 10*3/uL (ref 150–400)
RBC: 4.6 MIL/uL (ref 3.87–5.11)
WBC Count: 6.4 10*3/uL (ref 4.0–10.5)
nRBC: 0 % (ref 0.0–0.2)

## 2019-03-19 LAB — IRON AND TIBC
Iron: 43 ug/dL (ref 41–142)
Saturation Ratios: 14 % — ABNORMAL LOW (ref 21–57)
TIBC: 305 ug/dL (ref 236–444)
UIBC: 263 ug/dL (ref 120–384)

## 2019-03-19 MED ORDER — FAMOTIDINE IN NACL 20-0.9 MG/50ML-% IV SOLN
20.0000 mg | Freq: Once | INTRAVENOUS | Status: AC
  Administered 2019-03-19: 20 mg via INTRAVENOUS

## 2019-03-19 MED ORDER — SODIUM CHLORIDE 0.9 % IV SOLN
510.0000 mg | Freq: Once | INTRAVENOUS | Status: AC
  Administered 2019-03-19: 510 mg via INTRAVENOUS
  Filled 2019-03-19: qty 17

## 2019-03-19 MED ORDER — SODIUM CHLORIDE 0.9 % IV SOLN
Freq: Once | INTRAVENOUS | Status: AC
  Administered 2019-03-19: 10:00:00 via INTRAVENOUS
  Filled 2019-03-19: qty 250

## 2019-03-19 MED ORDER — DIPHENHYDRAMINE HCL 50 MG/ML IJ SOLN
12.5000 mg | Freq: Once | INTRAMUSCULAR | Status: AC
  Administered 2019-03-19: 10:00:00 12.5 mg via INTRAVENOUS

## 2019-03-19 MED ORDER — DIPHENHYDRAMINE HCL 50 MG/ML IJ SOLN
INTRAMUSCULAR | Status: AC
  Filled 2019-03-19: qty 1

## 2019-03-19 MED ORDER — FAMOTIDINE IN NACL 20-0.9 MG/50ML-% IV SOLN
INTRAVENOUS | Status: AC
  Filled 2019-03-19: qty 50

## 2019-03-19 NOTE — Patient Instructions (Signed)

## 2019-03-22 ENCOUNTER — Telehealth: Payer: Self-pay | Admitting: *Deleted

## 2019-03-22 ENCOUNTER — Other Ambulatory Visit: Payer: Self-pay | Admitting: Cardiology

## 2019-03-22 ENCOUNTER — Telehealth: Payer: Self-pay | Admitting: Neurology

## 2019-03-22 NOTE — Telephone Encounter (Signed)
Per Cira Rue, NP pt informed of lab results.  Pt informed that next iron infusion scheduled for 8/21.  Pt verbalized understanding.  No additional questions/concerns.

## 2019-03-22 NOTE — Telephone Encounter (Signed)
-----   Message from Alla Feeling, NP sent at 03/21/2019  8:11 PM EDT ----- Please let her know hemoglobin and iron have improved slightly. Proceed with next dose IV iron this week. Review upcoming lab schedule. Thanks, Regan Rakers

## 2019-03-22 NOTE — Telephone Encounter (Signed)

## 2019-03-22 NOTE — Procedures (Signed)
Patient Information     First Name: Christine [name] Last Name: [place] ID: [id number]  Birth Date: 03-25-1976 Age: 43 Gender: Female  Referring Provider:  BMI: 57.2 (W=335 lb, H=5' 4'')  Neck Circ.:   Epworth:  6/24   Sleep Study Information    Study Date: Mar 08, 2019 S/H/A Version: 001.001.001.001 / 4.0.1515 / 39  History:    Christine [name] is a 26-year young female African-American with a history of morbid obesity, high blood pressure, polycystic ovarian syndrome, osteoarthritis, bipolar disorder with many hospitalizations in the past, the last in February 2020.  She also reports headaches and difficulty sleeping.  She reports that her migraines are worsening, she reports that these are associated with nausea, photosensitivity and blurred vision.She has about 16 headaches a month. She had part previously maniac hypomanic and mixed episodes she has decreased her caffeine intake which she had increased before to help with headaches- She was last seen in person on 09 September 2018 with a BMI of 58.24 constituting super obesity and a high risk for obesity hypoventilation.   Summary & Diagnosis:    Severe, presumed obstructive sleep apnea at AHI of 61.9/h. A minimum of 1/3 of all apneas is central in origin. There was no breakdown into REM and NREM AHI and of RDI. The heart rate was notably tachy-bradycardic.     Recommendations:      I strongly recommend to have this patient's PAP therapy initiated after an attended sleep study.  This is not just because of the severity by AHI, but also because the HST failed to give total desaturation times, nadir and cannot measure CO2. The suspected diagnosis of obesity hypoventilation could not be confirmed. Physician Name: Larey Seat, MD   03-22-2019             Sleep Summary  Oxygen Saturation Statistics   Start Study Time: End Study Time: Total Recording Time:   11:38:44 PM 7:01:35 AM    7 h, 22 min  Total Sleep Time % REM of Sleep Time:  5 h,  46 min 6.8    Mean: 94 Minimum: 74 Maximum: 100  Mean of Desaturations Nadirs (%):    91  Oxygen Desaturation. %:   4-9 10-20 >20 Total  Events Number Total   155  23 86.1 12.8  2 1.1  180 100.0  Oxygen Saturation: <90 <=88 <85 <80 <70  Duration (minutes): Sleep % 7.7 2.2  5.7 2.2  1.7 0.6 0.6 0.2 0.0 0.0     Respiratory Indices      Total Events REM NREM All Night  pRDI:  309 pAHI:  303 ODI:  180 pAHIc:  33 % CSR: 0.0 N/A N/A N/A N/A N/A N/A N/A N/A 66.2 64.9 38.5 7.2       Pulse Rate Statistics during Sleep (BPM)      Mean: 75 Minimum: 36 Maximum: 109    Indices are calculated using technically valid sleep time of  4 h, 40 min. Central-Indices are calculated using technically valid sleep time of  4  h, 35 min. pRDI/ pAHI are calculated using 02 desaturations ? 3% REM/NREM indices appear only if REM time >  30 min.  Body Position Statistics  Position Supine Prone Right Left Non-Supine  Sleep (min) 310.4 20.0 4.0 10.5 34.5  Sleep % 89.6 5.8 1.2 3.0 10.0  pRDI 67.0 N/A N/A N/A 49.3  pAHI 65.8 N/A N/A N/A 45.2  ODI 39.7 N/A N/A N/A 20.6  Snoring Statistics Snoring Level (dB) >40 >50 >60 >70 >80 >Threshold (45)  Sleep (min) 114.2 19.8 14.2 0.0 0.0 29.7  Sleep % 33.0 5.7 4.1 0.0 0.0 8.6

## 2019-03-22 NOTE — Addendum Note (Signed)
Addended by: Larey Seat on: 03/22/2019 03:55 PM   Modules accepted: Orders

## 2019-03-23 ENCOUNTER — Ambulatory Visit (INDEPENDENT_AMBULATORY_CARE_PROVIDER_SITE_OTHER): Payer: Medicare Other | Admitting: Psychiatry

## 2019-03-23 ENCOUNTER — Other Ambulatory Visit: Payer: Self-pay

## 2019-03-23 ENCOUNTER — Encounter (HOSPITAL_COMMUNITY): Payer: Self-pay | Admitting: Psychiatry

## 2019-03-23 DIAGNOSIS — F3162 Bipolar disorder, current episode mixed, moderate: Secondary | ICD-10-CM

## 2019-03-23 NOTE — Progress Notes (Signed)
Virtual Visit via Video Note  I connected with Christine [name] on 03/23/19 at  1:00 PM EDT by a video enabled telemedicine application and verified that I am speaking with the correct person using two identifiers.  Location: Patient: Christine [name] Provider: Lise Auer, LCSW   I discussed the limitations of evaluation and management by telemedicine and the availability of in person appointments. The patient expressed understanding and agreed to proceed.  History of Present Illness: Bipolar 1 Disorder   Observations/Objective: Counselor met with Christine [name] for individual therapy via Webex. Counselor assessed MH symptoms and progress on treatment plan goals. Christine [name] denied suicidal ideation or self-harm behaviors. Christine [name] shared that she has been experiencing grief and loss symptoms from the loss of her mom last October. Counselor explored thoughts, feelings, memories and complicated family dynamics related to her grief and loss. Counselor provided psychoeducation about grief and loss cycle. Counselor and Christine [name] explored ways she can have permission to grieve, talk more about her mom's passing and the impacts, as well as find a way to celebrate her life with her husband and daughters. Christine [name] shared that she would like to be intentional in these efforts. She reported about progress in other areas of her life and the sadness that she is unable to share that with her mother. Counselor and [name] discussed upcoming appointments and future treatment.   Assessment and Plan: Counselor will continue to meet with patient to address treatment plan goals. Patient will continue to follow recommendations of providers and implement skills learned in session.  Follow Up Instructions: Counselor will send information for next session via Webex.     I discussed the assessment and treatment plan with the patient. The patient was provided an opportunity to ask questions and all were answered. The patient agreed with  the plan and demonstrated an understanding of the instructions.   The patient was advised to call back or seek an in-person evaluation if the symptoms worsen or if the condition fails to improve as anticipated.  I provided 60 minutes of non-face-to-face time during this encounter.   Lise Auer, LCSW

## 2019-03-26 ENCOUNTER — Telehealth: Payer: Self-pay | Admitting: *Deleted

## 2019-03-26 ENCOUNTER — Inpatient Hospital Stay: Payer: Medicare Other

## 2019-03-26 MED ORDER — METOPROLOL TARTRATE 50 MG PO TABS: 50.0000 mg | ORAL_TABLET | Freq: Once | ORAL | 0 refills | Status: DC

## 2019-03-26 NOTE — Telephone Encounter (Signed)
RECEIVED MESSAGE IN REGARDS TO PATIENT NOT BEING APLY TO START ccta  Due to  Increase heart rate   (From: Howie Ill Sent: 03/19/2019   2:47 PM EDT To: Lorenza Evangelist, RN, [facility] Triage Subject: cardiac ct                                     We rescheduled patient to August 24,200 130pm  - because they could not get her heart rate down? She will need another pill called in for her, please advise on the possible anti -anxiety medication ,patient was extremely nervous. )     Reviewed with Dr Ellyn Hack . per order   Patient should take  dose of  Toprol - take 100 mg  The morning of  Procedure along with 1/2 dose of amlodipine and take klonopin that morning as well.   take lopressor 50 mg ( metoprolol tartrate)  On arrival to the hospital    Unable to speak to patient left message to call back , and Rn will call Monday or Tuesday of next week . Test has been reschedule to 04/05/19

## 2019-03-26 NOTE — Telephone Encounter (Signed)
TCT patient as she did not show up for her iron infusion today. Spoke with patient.  She states she had some diarrhea last and this morning. She states she also had a fever of 100.3. Advised her to call her PCP if she does not feel better later today. Advised that a scheduler will call to re-schedule her iron infusion appt. Ms. Ennes voiced understanding.

## 2019-03-29 ENCOUNTER — Ambulatory Visit (HOSPITAL_COMMUNITY): Payer: Medicare Other

## 2019-03-29 ENCOUNTER — Other Ambulatory Visit (HOSPITAL_COMMUNITY): Payer: Medicare Other

## 2019-03-29 ENCOUNTER — Encounter (HOSPITAL_COMMUNITY): Payer: Self-pay

## 2019-03-29 ENCOUNTER — Other Ambulatory Visit (HOSPITAL_COMMUNITY): Payer: BC Managed Care – PPO

## 2019-03-29 NOTE — Telephone Encounter (Signed)
Left message to call back - instruction for  CCTA

## 2019-03-31 ENCOUNTER — Other Ambulatory Visit: Payer: Self-pay

## 2019-03-31 ENCOUNTER — Telehealth (INDEPENDENT_AMBULATORY_CARE_PROVIDER_SITE_OTHER): Payer: Medicare Other | Admitting: Family Medicine

## 2019-03-31 ENCOUNTER — Encounter (INDEPENDENT_AMBULATORY_CARE_PROVIDER_SITE_OTHER): Payer: Self-pay | Admitting: Family Medicine

## 2019-03-31 DIAGNOSIS — Z6841 Body Mass Index (BMI) 40.0 and over, adult: Secondary | ICD-10-CM

## 2019-03-31 DIAGNOSIS — E282 Polycystic ovarian syndrome: Secondary | ICD-10-CM | POA: Diagnosis not present

## 2019-04-01 ENCOUNTER — Other Ambulatory Visit: Payer: Self-pay

## 2019-04-01 ENCOUNTER — Telehealth: Payer: Self-pay | Admitting: *Deleted

## 2019-04-01 DIAGNOSIS — Z20822 Contact with and (suspected) exposure to covid-19: Secondary | ICD-10-CM

## 2019-04-01 DIAGNOSIS — R6889 Other general symptoms and signs: Secondary | ICD-10-CM | POA: Diagnosis not present

## 2019-04-01 NOTE — Progress Notes (Signed)
 Office: [phone]  /  Fax: [phone] TeleHealth Visit:  Christine [name] has verbally consented to this TeleHealth visit today. The patient is located at home, the provider is located at the [facility] office. The participants in this visit include the listed provider and patient. The visit was conducted today via [email or web address].  HPI:   Chief Complaint: OBESITY Christine is here to discuss her progress with her obesity treatment plan. She is on the keep a food journal with 1200-1500 calories and 85+ grams of protein daily and is following her eating plan approximately 33 % of the time. She states she is exercising with Youtube walking videos for 10-15 minutes 2 times per week. Christine continues to work on increasing lean protein in her diet. She states she has been sick lately with a fever and some nausea, so she has not followed her plan for the last few days. She plans to get back on track when she feels better.  We were unable to weigh the patient today for this TeleHealth visit. She feels as if she has maintained her weight since her last visit. She has lost 14 lbs since starting treatment with us.  Polycystic Ovarian Syndrome Christine has been working on diet and exercise, and has done well with decreasing simple carbohydrates. She is stable on metformin and denies nausea, vomiting, or hypoglycemia overall.  ASSESSMENT AND PLAN:  PCOS (polycystic ovarian syndrome)  Class 3 severe obesity with serious comorbidity and body mass index (BMI) of 50.0 to 59.9 in adult, unspecified obesity type (HCC)  PLAN:  Polycystic Ovarian Syndrome Christine is to continue with diet, exercise, and weight loss, and continue taking metformin. We will continue to monitor, and Christine agrees to follow up with our clinic in 2 weeks.  Obesity Christine is currently in the action stage of change. As such, her goal is to continue with weight loss efforts She has agreed to keep a food journal with 1600  calories and 90 grams of protein daily Christine has been instructed to work up to a goal of 150 minutes of combined cardio and strengthening exercise per week for weight loss and overall health benefits. We discussed the following Behavioral Modification Strategies today: increasing vegetables and increase H20 intake   Christine has agreed to follow up with our clinic in 2 weeks. She was informed of the importance of frequent follow up visits to maximize her success with intensive lifestyle modifications for her multiple health conditions.  ALLERGIES: Allergies  Allergen Reactions  . Penicillins Rash and Other (See Comments)    Has patient had a PCN reaction causing immediate rash, facial/tongue/throat swelling, SOB or lightheadedness with hypotension: Yes Has patient had a PCN reaction causing severe rash involving mucus membranes or skin necrosis: Unknown Has patient had a PCN reaction that required hospitalization: No Has patient had a PCN reaction occurring within the last 10 years: No If all of the above answers are "NO", then may proceed with Cephalosporin use.     MEDICATIONS: Current Outpatient Medications on File Prior to Visit  Medication Sig Dispense Refill  . amLODipine (NORVASC) 10 MG tablet Take 1 tablet (10 mg total) by mouth daily. 30 tablet 0  . ARIPiprazole (ABILIFY) 15 MG tablet Take 1 tablet (15 mg total) by mouth daily. 30 tablet 2  . clonazePAM (KLONOPIN) 0.5 MG tablet Take 1 tablet (0.5 mg total) by mouth daily as needed for anxiety. 30 tablet 0  . Erenumab-aooe (AIMOVIG) 140 MG/ML SOAJ Inject 140 mg   into the skin every 30 (thirty) days. 1 pen 11  . lamoTRIgine (LAMICTAL) 150 MG tablet Take 1 tablet (150 mg total) by mouth 2 (two) times daily. For mood 60 tablet 2  . lithium carbonate 300 MG capsule Take 2 capsule in AM and 3 capsule at bed time 150 capsule 2  . metFORMIN (GLUCOPHAGE) 500 MG tablet Take 1 tablet (500 mg total) by mouth 2 (two) times daily with a meal. 60  tablet 0  . metoprolol succinate (TOPROL-XL) 50 MG 24 hr tablet Take 50 mg by mouth daily. Take with or immediately following a meal.    . Vitamin D, Ergocalciferol, (DRISDOL) 1.25 MG (50000 UT) CAPS capsule Take 1 capsule (50,000 Units total) by mouth every 7 (seven) days. 4 capsule 0  . metoprolol tartrate (LOPRESSOR) 50 MG tablet Take 1 tablet (50 mg total) by mouth once for 1 dose. Take on arrival to schedule CARDIAC CORONARYCT ANGIOGRAM 1 tablet 0   No current facility-administered medications on file prior to visit.     PAST MEDICAL HISTORY: Past Medical History:  Diagnosis Date  . Anemia    Presumably from menorrhagia/menomenorrhagia; has had both blood and iron transfusions  . Anxiety   . Back pain   . Bipolar 1 disorder (HCC)    With depression  . Depression   . Essential hypertension   . Fibromyalgia   . Headache   . History of blood transfusion 08/2017   [facility]  . History of hiatal hernia   . Infertility, female   . Lactose intolerance   . Lower extremity edema   . Migraines   . Morbid obesity (HCC)   . Osteoarthritis    Hands, ankles; HLA-B27 positive  . PCOS (polycystic ovarian syndrome)   . Seasonal allergies    With recurrent allergic rhinitis  . Uterine leiomyoma   . Vitamin D deficiency     PAST SURGICAL HISTORY: Past Surgical History:  Procedure Laterality Date  . CERVICAL POLYPECTOMY  01/15/2018   Procedure: CERVICAL POLYPECTOMY;  Surgeon: McComb, John, MD;  Location: [facility];  Service: Gynecology;;  . COLONOSCOPY  2008   Normal  . DIAGNOSTIC LAPAROSCOPY  08/1999   dermoid cyst, [facility]  . DILATION AND CURETTAGE OF UTERUS  05/2004   [facility]  . DILATION AND CURETTAGE OF UTERUS  04/2015  . HYSTEROSCOPY W/D&C  07/22/2011   Procedure: DILATATION AND CURETTAGE /HYSTEROSCOPY;  Surgeon: Michelle L Grewal, MD;  Location: [facility];  Service: Gynecology;;  . HYSTEROSCOPY W/D&C N/A 01/15/2018   Procedure: DILATATION AND CURETTAGE /HYSTEROSCOPY WITH MYOSURE;  Surgeon:  McComb, John, MD;  Location: [facility];  Service: Gynecology;  Laterality: N/A;  . 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 TRANSTHORACIC ECHOCARDIOGRAM  08/2017    EF 65 to 70% with vigorous wall motion.  Suggestion of high cardiac output (possibly related to anemia thyrotoxicosis, Pregnancy, sepsis etc.) -- was in setting of symptomatic anemia - Hgb 5.7    SOCIAL HISTORY: Social History   Tobacco Use  . Smoking status: Never Smoker  . Smokeless tobacco: Never Used  Substance Use Topics  . Alcohol use: Yes    Comment: occasional, maybe once a month or so  . Drug use: No    FAMILY HISTORY: Family History  Problem Relation Age of Onset  . Anxiety disorder Mother   . Depression Mother   . Cancer Mother        pancreatic   . High blood pressure Mother   .   Hypertension Mother   . Hyperlipidemia Mother   . Cancer Father        colon  . High blood pressure Father   . Diabetes Father   . Hypertension Father   . Hyperlipidemia Father   . Heart disease Father   . Stroke Father   . Kidney disease Father   . Sleep apnea Father   . Obesity Father   . Cancer Brother 6       leukemia, childhood  . High blood pressure Brother   . Migraines Neg Hx     ROS: Review of Systems  Constitutional: Negative for weight loss.  Gastrointestinal: Negative for nausea and vomiting.  Endo/Heme/Allergies:       Negative hypoglycemia    PHYSICAL EXAM: Pt in no acute distress  RECENT LABS AND TESTS: BMET    Component Value Date/Time   NA 140 02/16/2019 0835   NA 138 10/07/2018 1219   K 3.7 02/16/2019 0835   CL 108 02/16/2019 0835   CO2 22 02/16/2019 0835   GLUCOSE 119 (H) 02/16/2019 0835   BUN 13 02/16/2019 0835   BUN 10 10/07/2018 1219   CREATININE 0.87 02/16/2019 0835   CALCIUM 8.9 02/16/2019 0835   GFRNONAA >60 02/16/2019 0835   GFRAA >60 02/16/2019 0835   Lab Results  Component Value Date   HGBA1C 5.7 (H) 10/07/2018   Lab Results  Component Value Date   INSULIN 19.5  10/07/2018   CBC    Component Value Date/Time   WBC 6.4 03/19/2019 0819   WBC 9.6 01/30/2019 1519   RBC 4.60 03/19/2019 0819   HGB 11.1 (L) 03/19/2019 0819   HCT 37.6 03/19/2019 0819   PLT 317 03/19/2019 0819   MCV 81.7 03/19/2019 0819   MCH 24.1 (L) 03/19/2019 0819   MCHC 29.5 (L) 03/19/2019 0819   RDW Not Measured 03/19/2019 0819   LYMPHSABS 1.7 03/19/2019 0819   MONOABS 0.5 03/19/2019 0819   EOSABS 0.1 03/19/2019 0819   BASOSABS 0.0 03/19/2019 0819   Iron/TIBC/Ferritin/ %Sat    Component Value Date/Time   IRON 43 03/19/2019 0819   TIBC 305 03/19/2019 0819   FERRITIN 37 03/19/2019 0819   IRONPCTSAT 14 (L) 03/19/2019 0819   Lipid Panel     Component Value Date/Time   CHOL 183 10/07/2018 1220   TRIG 70 10/07/2018 1220   HDL 38 (L) 10/07/2018 1220   LDLCALC 131 (H) 10/07/2018 1220   Hepatic Function Panel     Component Value Date/Time   PROT 7.3 02/16/2019 0835   PROT 7.0 10/07/2018 1219   ALBUMIN 3.8 02/16/2019 0835   ALBUMIN 4.2 10/07/2018 1219   AST 9 (L) 02/16/2019 0835   ALT 14 02/16/2019 0835   ALKPHOS 66 02/16/2019 0835   BILITOT 0.4 02/16/2019 0835      Component Value Date/Time   TSH 1.260 10/07/2018 1219   TSH 1.460 04/28/2018 1130      I, Trixie Dredge, am acting as transcriptionist for Dennard Nip, MD I have reviewed the above documentation for accuracy and completeness, and I agree with the above. -Dennard Nip, MD

## 2019-04-01 NOTE — Telephone Encounter (Signed)
TCT patient.  She had been called earlier for covid screening and stated she has had fevers on and off for the last week or so, increased fatigue and muscle aches.  Tmax was 101.8  She had been taking Tylenol and had fever reduction for a bit and then fever would go back up.  She was scheduled for IV iron tomorrow. Spoke with her.  She states it has only been less than 24 hours since her last fever.  She has not been tested for COVID recently. Advised that she should go to screening center on [address] to get tested. Once her results are back and are negative , she can be re-scheduled for her IV iron. Pt voiced understanding and stated she will go to be tested.  She states she will call back after she receives the results.

## 2019-04-02 ENCOUNTER — Inpatient Hospital Stay: Payer: Medicare Other

## 2019-04-02 ENCOUNTER — Telehealth (HOSPITAL_COMMUNITY): Payer: Self-pay | Admitting: Emergency Medicine

## 2019-04-02 ENCOUNTER — Telehealth: Payer: Self-pay | Admitting: Cardiology

## 2019-04-02 NOTE — Telephone Encounter (Signed)
Pt went to the community COVID testing at [place] yesterday and was told that her results would be back within 24-48hrs.  She has a CTA scheduled on 8.31 and is wondering if she should cancel.   She has been symptom free and afebrile for the past 24 hours. I advised pt to keep appt at this time in hopes that her COVID test will come back negative before her appt on Monday otherwise she would need to cancel. Pt agreeable.

## 2019-04-02 NOTE — Telephone Encounter (Signed)
° °  Patient scheduled for CTA on 8/31 Covid results pending Patient had a fever and other cold symptoms last week. She has been without a fever for 24hours.   Patient has questions about covid results versus rescheduling

## 2019-04-02 NOTE — Telephone Encounter (Signed)
Reaching out to patient to offer assistance regarding upcoming cardiac imaging study; pt verbalizes understanding of appt date/time, parking situation and where to check in, pre-test NPO status and medications ordered, and verified current allergies; name and call back number provided for further questions should they arise Marchia Bond RN Navigator [facility] [phone] office [phone] cell   Pt recently with fever- tested for COVID 19 yesterday 04/01/19, RESULTS PENDING If negative, will continue with testing. If pending or positive, will cancel appt and postpone test  Pt verbalized understanding

## 2019-04-03 LAB — NOVEL CORONAVIRUS, NAA: SARS-CoV-2, NAA: NOT DETECTED

## 2019-04-05 ENCOUNTER — Other Ambulatory Visit: Payer: Self-pay

## 2019-04-05 ENCOUNTER — Ambulatory Visit (HOSPITAL_COMMUNITY)
Admission: RE | Admit: 2019-04-05 | Discharge: 2019-04-05 | Disposition: A | Discharge status: Home / Self Care | Payer: Medicare Other | Source: Ambulatory Visit | Attending: Cardiology | Admitting: Cardiology

## 2019-04-05 ENCOUNTER — Ambulatory Visit (HOSPITAL_COMMUNITY): Admission: RE | Admit: 2019-04-05 | Payer: Medicare Other | Source: Ambulatory Visit

## 2019-04-05 ENCOUNTER — Encounter (HOSPITAL_COMMUNITY): Payer: Self-pay

## 2019-04-05 DIAGNOSIS — I272 Pulmonary hypertension, unspecified: Secondary | ICD-10-CM | POA: Diagnosis not present

## 2019-04-05 DIAGNOSIS — Z01818 Encounter for other preprocedural examination: Secondary | ICD-10-CM | POA: Diagnosis present

## 2019-04-05 DIAGNOSIS — R072 Precordial pain: Secondary | ICD-10-CM | POA: Insufficient documentation

## 2019-04-05 DIAGNOSIS — R0789 Other chest pain: Secondary | ICD-10-CM | POA: Insufficient documentation

## 2019-04-05 MED ORDER — METOPROLOL TARTRATE 5 MG/5ML IV SOLN
INTRAVENOUS | Status: AC
  Filled 2019-04-05: qty 20

## 2019-04-05 MED ORDER — IOHEXOL 350 MG/ML SOLN
100.0000 mL | Freq: Once | INTRAVENOUS | Status: AC | PRN
  Administered 2019-04-05: 100 mL via INTRAVENOUS

## 2019-04-05 MED ORDER — METOPROLOL TARTRATE 5 MG/5ML IV SOLN
5.0000 mg | INTRAVENOUS | Status: DC | PRN
  Administered 2019-04-05: 5 mg via INTRAVENOUS
  Filled 2019-04-05: qty 5

## 2019-04-05 MED ORDER — NITROGLYCERIN 0.4 MG SL SUBL
0.8000 mg | SUBLINGUAL_TABLET | Freq: Once | SUBLINGUAL | Status: AC
  Administered 2019-04-05: 14:00:00 0.8 mg via SUBLINGUAL
  Filled 2019-04-05: qty 25

## 2019-04-05 MED ORDER — NITROGLYCERIN 0.4 MG SL SUBL
SUBLINGUAL_TABLET | SUBLINGUAL | Status: AC
  Administered 2019-04-05: 0.8 mg via SUBLINGUAL
  Filled 2019-04-05: qty 2

## 2019-04-06 NOTE — Telephone Encounter (Signed)
ccta completed

## 2019-04-14 ENCOUNTER — Telehealth (INDEPENDENT_AMBULATORY_CARE_PROVIDER_SITE_OTHER): Payer: Medicare Other | Admitting: Family Medicine

## 2019-04-14 ENCOUNTER — Other Ambulatory Visit: Payer: Self-pay

## 2019-04-14 ENCOUNTER — Encounter (INDEPENDENT_AMBULATORY_CARE_PROVIDER_SITE_OTHER): Payer: Self-pay | Admitting: Family Medicine

## 2019-04-14 DIAGNOSIS — Z6841 Body Mass Index (BMI) 40.0 and over, adult: Secondary | ICD-10-CM | POA: Diagnosis not present

## 2019-04-14 DIAGNOSIS — R7303 Prediabetes: Secondary | ICD-10-CM | POA: Diagnosis not present

## 2019-04-14 DIAGNOSIS — E559 Vitamin D deficiency, unspecified: Secondary | ICD-10-CM | POA: Diagnosis not present

## 2019-04-14 MED ORDER — VITAMIN D (ERGOCALCIFEROL) 1.25 MG (50000 UNIT) PO CAPS: 50000.0000 [IU] | ORAL_CAPSULE | ORAL | 0 refills | Status: DC

## 2019-04-14 MED ORDER — METFORMIN HCL 500 MG PO TABS: 500.0000 mg | ORAL_TABLET | Freq: Two times a day (BID) | ORAL | 0 refills | Status: DC

## 2019-04-15 NOTE — Progress Notes (Signed)
Office: [phone]  /  Fax: [phone] TeleHealth Visit:  Christine [name] has verbally consented to this TeleHealth visit today. The patient is located at home, the provider is located at the [facility] office. The participants in this visit include the listed provider and patient. The visit was conducted today via telephone call (Doxy failed - changed to telephone call).  HPI:   Chief Complaint: OBESITY Christine [name] is here to discuss her progress with her obesity treatment plan. She is journaling and is following her eating plan approximately 33% of the time. She states she is exercising 0 minutes 0 times per week. Christine [name] has done well maintaining her weight. She is recovering from a COVID negative viral illness, the kids started back to school, and she has been off her routine. We were unable to weigh the patient today for this TeleHealth visit. She feels as if she has gained 1 lb since her last visit. She has lost 14 lbs since starting treatment with [place].  Pre-Diabetes Christine [name] has a diagnosis of prediabetes based on her elevated Hgb A1c and was informed this puts her at greater risk of developing diabetes. She is taking metformin currently and continues to work on diet and exercise to decrease risk of diabetes. She denies nausea, vomiting, or hypoglycemia.  Vitamin D deficiency Christine [name] has a diagnosis of Vitamin D deficiency. She is currently taking prescription Vit D and denies nausea, vomiting or muscle weakness. Christine [name] has been unable to get labs done due to COVID-19 and her recent illness. Her last level was very low at 7.0 on 10/07/2018.  ASSESSMENT AND PLAN:  Vitamin D deficiency - Plan: Vitamin D, Ergocalciferol, (DRISDOL) 1.25 MG (50000 UT) CAPS capsule  Prediabetes - Plan: metFORMIN (GLUCOPHAGE) 500 MG tablet  Class 3 severe obesity with serious comorbidity and body mass index (BMI) of 50.0 to 59.9 in adult, unspecified obesity type ([place])  PLAN:  Pre-Diabetes  Christine [name] will continue to work on weight loss, exercise, and decreasing simple carbohydrates in her diet to help decrease the risk of diabetes. We dicussed metformin including benefits and risks. She was informed that eating too many simple carbohydrates or too many calories at one sitting increases the likelihood of GI side effects. Christine [name] was given a refill on her metformin 500 mg #60 with 0 refills and agrees to follow-up with our clinic in 2 weeks. She will have labs checked in 1 month.  Vitamin D Deficiency Christine [name] was informed that low Vitamin D levels contributes to fatigue and are associated with obesity, breast, and colon cancer. She agrees to continue to take prescription Vit D @ 50,000 IU every week #4 with 0 refills and will follow-up for routine testing of Vitamin D, at least 2-3 times per year. She was informed of the risk of over-replacement of Vitamin D and agrees to not increase her dose unless she discusses this with [place] first. Christine [name] agrees to follow-up with our clinic in 2 weeks.  Obesity Christine [name] is currently in the action stage of change. As such, her goal is to continue with weight loss efforts. She has agreed to keep a food journal with 1600 calories and 90 grams of protein.  Christine [name] has been instructed to work up to a goal of 150 minutes of combined cardio and strengthening exercise per week for weight loss and overall health benefits. We discussed the following Behavioral Modification Strategies today: increasing lean protein intake, decreasing simple carbohydrates, and keep a strict food journal.  Christine [name] has agreed to  follow-up with our clinic in 2 weeks. She was informed of the importance of frequent follow-up visits to maximize her success with intensive lifestyle modifications for her multiple health conditions.  ALLERGIES: Allergies  Allergen Reactions  . Penicillins Rash and Other (See Comments)    Has patient had a PCN reaction causing immediate rash, facial/tongue/throat  swelling, SOB or lightheadedness with hypotension: Yes Has patient had a PCN reaction causing severe rash involving mucus membranes or skin necrosis: Unknown Has patient had a PCN reaction that required hospitalization: No Has patient had a PCN reaction occurring within the last 10 years: No If all of the above answers are "NO", then may proceed with Cephalosporin use.     MEDICATIONS: Current Outpatient Medications on File Prior to Visit  Medication Sig Dispense Refill  . amLODipine (NORVASC) 10 MG tablet Take 1 tablet (10 mg total) by mouth daily. 30 tablet 0  . ARIPiprazole (ABILIFY) 15 MG tablet Take 1 tablet (15 mg total) by mouth daily. 30 tablet 2  . clonazePAM (KLONOPIN) 0.5 MG tablet Take 1 tablet (0.5 mg total) by mouth daily as needed for anxiety. 30 tablet 0  . Erenumab-aooe (AIMOVIG) 140 MG/ML SOAJ Inject 140 mg into the skin every 30 (thirty) days. 1 pen 11  . lamoTRIgine (LAMICTAL) 150 MG tablet Take 1 tablet (150 mg total) by mouth 2 (two) times daily. For mood 60 tablet 2  . lithium carbonate 300 MG capsule Take 2 capsule in AM and 3 capsule at bed time 150 capsule 2  . metoprolol succinate (TOPROL-XL) 50 MG 24 hr tablet Take 50 mg by mouth daily. Take with or immediately following a meal.     No current facility-administered medications on file prior to visit.     PAST MEDICAL HISTORY: Past Medical History:  Diagnosis Date  . Anemia    Presumably from menorrhagia/menomenorrhagia; has had both blood and iron transfusions  . Anxiety   . Back pain   . Bipolar 1 disorder ([place])    With depression  . Depression   . Essential hypertension   . Fibromyalgia   . Headache   . History of blood transfusion 08/2017   [facility]  . History of hiatal hernia   . Infertility, female   . Lactose intolerance   . Lower extremity edema   . Migraines   . Morbid obesity ([place])   . Osteoarthritis    Hands, ankles; HLA-B27 positive  . PCOS (polycystic ovarian syndrome)   . Seasonal  allergies    With recurrent allergic rhinitis  . Uterine leiomyoma   . Vitamin D deficiency     PAST SURGICAL HISTORY: Past Surgical History:  Procedure Laterality Date  . CERVICAL POLYPECTOMY  01/15/2018   Procedure: CERVICAL POLYPECTOMY;  Surgeon: Arvella Nigh, MD;  Location: [facility];  Service: Gynecology;;  . COLONOSCOPY  2008   Normal  . DIAGNOSTIC LAPAROSCOPY  08/1999   dermoid cyst, [facility]  . DILATION AND CURETTAGE OF UTERUS  05/2004   [facility]  . DILATION AND CURETTAGE OF UTERUS  04/2015  . HYSTEROSCOPY W/D&C  07/22/2011   Procedure: DILATATION AND CURETTAGE /HYSTEROSCOPY;  Surgeon: Cyril Mourning, MD;  Location: [facility];  Service: Gynecology;;  . HYSTEROSCOPY W/D&C N/A 01/15/2018   Procedure: DILATATION AND CURETTAGE [place] WITH MYOSURE;  Surgeon: Arvella Nigh, MD;  Location: [facility];  Service: Gynecology;  Laterality: N/A;  . 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 TRANSTHORACIC ECHOCARDIOGRAM  08/2017    EF 65  to 70% with vigorous wall motion.  Suggestion of high cardiac output (possibly related to anemia thyrotoxicosis, Pregnancy, sepsis etc.) -- was in setting of symptomatic anemia - Hgb 5.7    SOCIAL HISTORY: Social History   Tobacco Use  . Smoking status: Never Smoker  . Smokeless tobacco: Never Used  Substance Use Topics  . Alcohol use: Yes    Comment: occasional, maybe once a month or so  . Drug use: No    FAMILY HISTORY: Family History  Problem Relation Age of Onset  . Anxiety disorder Mother   . Depression Mother   . Cancer Mother        pancreatic   . High blood pressure Mother   . Hypertension Mother   . Hyperlipidemia Mother   . Cancer Father        colon  . High blood pressure Father   . Diabetes Father   . Hypertension Father   . Hyperlipidemia Father   . Heart disease Father   . Stroke Father   . Kidney disease Father   . Sleep apnea Father   . Obesity Father   . Cancer Brother 6       leukemia, childhood  . High blood pressure  Brother   . Migraines Neg Hx    ROS: Review of Systems  Gastrointestinal: Negative for nausea and vomiting.  Musculoskeletal:       Negative for muscle weakness.  Endo/Heme/Allergies:       Negative for hypoglycemia.   PHYSICAL EXAM: Pt in no acute distress  RECENT LABS AND TESTS: BMET    Component Value Date/Time   NA 140 02/16/2019 0835   NA 138 10/07/2018 1219   K 3.7 02/16/2019 0835   CL 108 02/16/2019 0835   CO2 22 02/16/2019 0835   GLUCOSE 119 (H) 02/16/2019 0835   BUN 13 02/16/2019 0835   BUN 10 10/07/2018 1219   CREATININE 0.87 02/16/2019 0835   CALCIUM 8.9 02/16/2019 0835   GFRNONAA >60 02/16/2019 0835   GFRAA >60 02/16/2019 0835   Lab Results  Component Value Date   HGBA1C 5.7 (H) 10/07/2018   Lab Results  Component Value Date   INSULIN 19.5 10/07/2018   CBC    Component Value Date/Time   WBC 6.4 03/19/2019 0819   WBC 9.6 01/30/2019 1519   RBC 4.60 03/19/2019 0819   HGB 11.1 (L) 03/19/2019 0819   HCT 37.6 03/19/2019 0819   PLT 317 03/19/2019 0819   MCV 81.7 03/19/2019 0819   MCH 24.1 (L) 03/19/2019 0819   MCHC 29.5 (L) 03/19/2019 0819   RDW Not Measured 03/19/2019 0819   LYMPHSABS 1.7 03/19/2019 0819   MONOABS 0.5 03/19/2019 0819   EOSABS 0.1 03/19/2019 0819   BASOSABS 0.0 03/19/2019 0819   Iron/TIBC/Ferritin/ %Sat    Component Value Date/Time   IRON 43 03/19/2019 0819   TIBC 305 03/19/2019 0819   FERRITIN 37 03/19/2019 0819   IRONPCTSAT 14 (L) 03/19/2019 0819   Lipid Panel     Component Value Date/Time   CHOL 183 10/07/2018 1220   TRIG 70 10/07/2018 1220   HDL 38 (L) 10/07/2018 1220   LDLCALC 131 (H) 10/07/2018 1220   Hepatic Function Panel     Component Value Date/Time   PROT 7.3 02/16/2019 0835   PROT 7.0 10/07/2018 1219   ALBUMIN 3.8 02/16/2019 0835   ALBUMIN 4.2 10/07/2018 1219   AST 9 (L) 02/16/2019 0835   ALT 14 02/16/2019 0835   ALKPHOS 66  02/16/2019 0835   BILITOT 0.4 02/16/2019 0835      Component Value  Date/Time   TSH 1.260 10/07/2018 1219   TSH 1.460 04/28/2018 1130   Results for AZIYAH, PROVENCAL (MRN [id number]) as of 04/15/2019 08:42  Ref. Range 10/07/2018 12:19  Vitamin D, 25-Hydroxy Latest Ref Range: 30.0 - 100.0 ng/mL 7.0 (L)   I, Michaelene Song, am acting as Location manager for Dennard Nip, MD I have reviewed the above documentation for accuracy and completeness, and I agree with the above. -Dennard Nip, MD

## 2019-04-16 ENCOUNTER — Telehealth: Payer: Self-pay | Admitting: Hematology

## 2019-04-16 NOTE — Telephone Encounter (Signed)
Scheduled appt per 9/11 sch message.  Spoke with patient and she is aware of her appt date and time

## 2019-04-19 ENCOUNTER — Inpatient Hospital Stay: Payer: Medicare Other

## 2019-04-19 ENCOUNTER — Inpatient Hospital Stay: Payer: Medicare Other | Attending: Nurse Practitioner

## 2019-04-19 ENCOUNTER — Other Ambulatory Visit: Payer: Self-pay

## 2019-04-19 VITALS — BP 128/53 | HR 86 | Temp 98.0°F | Resp 16

## 2019-04-19 DIAGNOSIS — D500 Iron deficiency anemia secondary to blood loss (chronic): Secondary | ICD-10-CM

## 2019-04-19 DIAGNOSIS — N920 Excessive and frequent menstruation with regular cycle: Secondary | ICD-10-CM | POA: Insufficient documentation

## 2019-04-19 LAB — CBC WITH DIFFERENTIAL (CANCER CENTER ONLY)
Abs Immature Granulocytes: 0.03 10*3/uL (ref 0.00–0.07)
Basophils Absolute: 0 10*3/uL (ref 0.0–0.1)
Basophils Relative: 0 %
Eosinophils Absolute: 0.1 10*3/uL (ref 0.0–0.5)
Eosinophils Relative: 2 %
HCT: 35.6 % — ABNORMAL LOW (ref 36.0–46.0)
Hemoglobin: 10.9 g/dL — ABNORMAL LOW (ref 12.0–15.0)
Immature Granulocytes: 1 %
Lymphocytes Relative: 27 %
Lymphs Abs: 1.6 10*3/uL (ref 0.7–4.0)
MCH: 27.2 pg (ref 26.0–34.0)
MCHC: 30.6 g/dL (ref 30.0–36.0)
MCV: 88.8 fL (ref 80.0–100.0)
Monocytes Absolute: 0.6 10*3/uL (ref 0.1–1.0)
Monocytes Relative: 10 %
Neutro Abs: 3.6 10*3/uL (ref 1.7–7.7)
Neutrophils Relative %: 60 %
Platelet Count: 241 10*3/uL (ref 150–400)
RBC: 4.01 MIL/uL (ref 3.87–5.11)
RDW: 22.1 % — ABNORMAL HIGH (ref 11.5–15.5)
WBC Count: 5.9 10*3/uL (ref 4.0–10.5)
nRBC: 0 % (ref 0.0–0.2)

## 2019-04-19 LAB — IRON AND TIBC
Iron: 40 ug/dL — ABNORMAL LOW (ref 41–142)
Saturation Ratios: 13 % — ABNORMAL LOW (ref 21–57)
TIBC: 313 ug/dL (ref 236–444)
UIBC: 273 ug/dL (ref 120–384)

## 2019-04-19 LAB — FERRITIN: Ferritin: 27 ng/mL (ref 11–307)

## 2019-04-19 MED ORDER — DIPHENHYDRAMINE HCL 50 MG/ML IJ SOLN
INTRAMUSCULAR | Status: AC
  Filled 2019-04-19: qty 1

## 2019-04-19 MED ORDER — FAMOTIDINE IN NACL 20-0.9 MG/50ML-% IV SOLN
INTRAVENOUS | Status: AC
  Filled 2019-04-19: qty 50

## 2019-04-19 MED ORDER — DIPHENHYDRAMINE HCL 50 MG/ML IJ SOLN
12.5000 mg | Freq: Once | INTRAMUSCULAR | Status: AC
  Administered 2019-04-19: 12.5 mg via INTRAVENOUS

## 2019-04-19 MED ORDER — SODIUM CHLORIDE 0.9 % IV SOLN
Freq: Once | INTRAVENOUS | Status: AC
  Administered 2019-04-19: 09:00:00 via INTRAVENOUS
  Filled 2019-04-19: qty 250

## 2019-04-19 MED ORDER — INFLUENZA VAC SPLIT QUAD 0.5 ML IM SUSY: 0.5000 mL | PREFILLED_SYRINGE | Freq: Once | INTRAMUSCULAR | Status: DC

## 2019-04-19 MED ORDER — SODIUM CHLORIDE 0.9 % IV SOLN
510.0000 mg | Freq: Once | INTRAVENOUS | Status: AC
  Administered 2019-04-19: 510 mg via INTRAVENOUS
  Filled 2019-04-19: qty 510

## 2019-04-19 MED ORDER — FAMOTIDINE IN NACL 20-0.9 MG/50ML-% IV SOLN
20.0000 mg | Freq: Once | INTRAVENOUS | Status: AC
  Administered 2019-04-19: 20 mg via INTRAVENOUS

## 2019-04-19 NOTE — Patient Instructions (Signed)

## 2019-04-21 ENCOUNTER — Other Ambulatory Visit: Payer: Self-pay

## 2019-04-21 ENCOUNTER — Ambulatory Visit (INDEPENDENT_AMBULATORY_CARE_PROVIDER_SITE_OTHER): Payer: Medicare Other | Admitting: Psychiatry

## 2019-04-21 ENCOUNTER — Encounter (HOSPITAL_COMMUNITY): Payer: Self-pay | Admitting: Psychiatry

## 2019-04-21 DIAGNOSIS — F411 Generalized anxiety disorder: Secondary | ICD-10-CM

## 2019-04-21 DIAGNOSIS — F3162 Bipolar disorder, current episode mixed, moderate: Secondary | ICD-10-CM | POA: Diagnosis not present

## 2019-04-21 DIAGNOSIS — Z79899 Other long term (current) drug therapy: Secondary | ICD-10-CM | POA: Diagnosis not present

## 2019-04-21 MED ORDER — LITHIUM CARBONATE 300 MG PO CAPS: ORAL_CAPSULE | ORAL | 2 refills | Status: DC

## 2019-04-21 MED ORDER — ARIPIPRAZOLE 15 MG PO TABS: 15.0000 mg | ORAL_TABLET | Freq: Every day | ORAL | 2 refills | Status: DC

## 2019-04-21 MED ORDER — LAMOTRIGINE 150 MG PO TABS: 150.0000 mg | ORAL_TABLET | Freq: Two times a day (BID) | ORAL | 2 refills | Status: DC

## 2019-04-21 MED ORDER — CLONAZEPAM 0.5 MG PO TABS: 0.5000 mg | ORAL_TABLET | Freq: Every day | ORAL | 0 refills | Status: DC | PRN

## 2019-04-21 NOTE — Progress Notes (Signed)
Virtual Visit via Telephone Note  I connected with Christine [name] on 04/21/19 at 10:20 AM EDT by telephone and verified that I am speaking with the correct person using two identifiers.   I discussed the limitations, risks, security and privacy concerns of performing an evaluation and management service by telephone and the availability of in person appointments. I also discussed with the patient that there may be a patient responsible charge related to this service. The patient expressed understanding and agreed to proceed.   History of Present Illness: Patient was evaluated by phone session.  Since taking her  Abilify lithium Lamictal and Klonopin as needed she is doing much better.  She realized that compliance with medication is helping her mood and irritability.  Patient told her husband is working from home and kids are doing virtual schooling from the home and sometimes causes a lot of stress but she is handling the stress much better.  She also recently diagnosed with sleep apnea which made her understand why she has not sleep very well in recent years.  She is tolerating her medication and reported no side effects.  She has no rash, itching, tremors, shakes or any EPS.  Her husband is very supportive.  She is getting iron infusion on a regular basis.  She is pleased since approved for Medicaid and Medicare she can afford Abilify.  She lives with her husband and 64 and 43 years old.  Her husband is very supportive.  She denies drinking very using any illegal substances.  She wants to continue current medication.     Past Psychiatric History:Reviewed. H/O depression since age 39. Antidepressant mademanic. Saw Dr. [name] and tried Risperdal, Seroquel, Abilify, lithium, Prozac, Paxil, Zoloft, Lexapro, Celexa, Effexor and Klonopin.H/O mania and impulsive behavior. We tried [name]. H/O 3 inpatient and PHP and IOP. No h/o suicidal attempt. Last inpatient in November  2017.  Psychiatric Specialty Exam: Physical Exam  ROS  There were no vitals taken for this visit.There is no height or weight on file to calculate BMI.  General Appearance: NA  Eye Contact:  NA  Speech:  Clear and Coherent and fast  Volume:  Normal  Mood:  Euthymic  Affect:  NA  Thought Process:  Descriptions of Associations: Intact  Orientation:  Full (Time, Place, and Person)  Thought Content:  WDL  Suicidal Thoughts:  No  Homicidal Thoughts:  No  Memory:  Immediate;   Good Recent;   Fair Remote;   Good  Judgement:  Good  Insight:  Good  Psychomotor Activity:  NA  Concentration:  Concentration: Fair and Attention Span: Fair  Recall:  Good  Fund of Knowledge:  Good  Language:  Good  Akathisia:  No  Handed:  Right  AIMS (if indicated):     Assets:  Communication Skills Desire for Improvement Housing Resilience Social Support  ADL's:  Intact  Cognition:  WNL  Sleep:   ok      Assessment and Plan: Bipolar disorder type I.  Generalized anxiety disorder.  Patient is doing better on her current medication.  I will continue lithium 600 mg in the morning and 9 mg at bedtime.  Lamictal 300 mg daily, Abilify 15 mg at bedtime and Klonopin 0.5 mg as needed for anxiety.  I will order lithium level.  Patient is going regularly for her iron infusion and getting blood work.  I suggested that if she can get lithium level at cancer center then she should do it otherwise she can  stop by at our office for lithium level.  She agreed with the plan.  I recommended to call us back if she has any question or any concern.  Follow-up in 3 months.  Follow Up Instructions:    I discussed the assessment and treatment plan with the patient. The patient was provided an opportunity to ask questions and all were answered. The patient agreed with the plan and demonstrated an understanding of the instructions.   The patient was advised to call back or seek an in-person evaluation if the symptoms  worsen or if the condition fails to improve as anticipated.  I provided 20 minutes of non-face-to-face time during this encounter.   Kathlee Nations, MD

## 2019-04-23 ENCOUNTER — Other Ambulatory Visit (HOSPITAL_COMMUNITY)
Admission: RE | Admit: 2019-04-23 | Discharge: 2019-04-23 | Disposition: A | Discharge status: Home / Self Care | Payer: Medicare Other | Source: Ambulatory Visit | Attending: Neurology | Admitting: Neurology

## 2019-04-23 DIAGNOSIS — Z20828 Contact with and (suspected) exposure to other viral communicable diseases: Secondary | ICD-10-CM | POA: Insufficient documentation

## 2019-04-23 DIAGNOSIS — Z01812 Encounter for preprocedural laboratory examination: Secondary | ICD-10-CM | POA: Diagnosis not present

## 2019-04-24 LAB — NOVEL CORONAVIRUS, NAA (HOSP ORDER, SEND-OUT TO REF LAB; TAT 18-24 HRS): SARS-CoV-2, NAA: NOT DETECTED

## 2019-04-28 ENCOUNTER — Telehealth (INDEPENDENT_AMBULATORY_CARE_PROVIDER_SITE_OTHER): Payer: BC Managed Care – PPO | Admitting: Family Medicine

## 2019-04-29 ENCOUNTER — Encounter (INDEPENDENT_AMBULATORY_CARE_PROVIDER_SITE_OTHER): Payer: Self-pay

## 2019-05-14 ENCOUNTER — Encounter: Payer: Self-pay | Admitting: Cardiology

## 2019-05-14 ENCOUNTER — Ambulatory Visit (INDEPENDENT_AMBULATORY_CARE_PROVIDER_SITE_OTHER): Payer: Medicare Other | Admitting: Cardiology

## 2019-05-14 ENCOUNTER — Other Ambulatory Visit: Payer: Self-pay

## 2019-05-14 ENCOUNTER — Other Ambulatory Visit (HOSPITAL_COMMUNITY): Payer: Self-pay

## 2019-05-14 VITALS — BP 146/76 | HR 118 | Temp 97.9°F | Ht 65.0 in | Wt 342.0 lb

## 2019-05-14 DIAGNOSIS — I10 Essential (primary) hypertension: Secondary | ICD-10-CM

## 2019-05-14 DIAGNOSIS — R010 Benign and innocent cardiac murmurs: Secondary | ICD-10-CM

## 2019-05-14 DIAGNOSIS — E662 Morbid (severe) obesity with alveolar hypoventilation: Secondary | ICD-10-CM

## 2019-05-14 DIAGNOSIS — Z6841 Body Mass Index (BMI) 40.0 and over, adult: Secondary | ICD-10-CM | POA: Diagnosis not present

## 2019-05-14 DIAGNOSIS — R0789 Other chest pain: Secondary | ICD-10-CM

## 2019-05-14 DIAGNOSIS — I272 Pulmonary hypertension, unspecified: Secondary | ICD-10-CM

## 2019-05-14 DIAGNOSIS — R000 Tachycardia, unspecified: Secondary | ICD-10-CM

## 2019-05-14 MED ORDER — OLMESARTAN MEDOXOMIL 5 MG PO TABS: 5.0000 mg | ORAL_TABLET | Freq: Every day | ORAL | 6 refills | Status: DC

## 2019-05-14 MED ORDER — METOPROLOL SUCCINATE ER 100 MG PO TB24: 100.0000 mg | ORAL_TABLET | Freq: Every day | ORAL | 3 refills | Status: DC

## 2019-05-14 NOTE — Patient Instructions (Addendum)
Medication Instructions:  INCREASE METOPROLOL SUCCINATE TO 100 MG DAILY  START  OLMESARTAN 5 MG ONE TABLET DAILY    If you need a refill on your cardiac medications before your next appointment, please call your pharmacy.   Lab work:  If you have labs (blood work) drawn today and your tests are completely normal, you will receive your results only by: [name] MyChart Message (if you have MyChart) OR . A paper copy in the mail If you have any lab test that is abnormal or we need to change your treatment, we will call you to review the results.  Testing/Procedures: Will be schedule at [facility]  [address] has requested that you have an echocardiogram. Echocardiography is a painless test that uses sound waves to create images of your heart. It provides your doctor with information about the size and shape of your heart and how well your heart's chambers and valves are working. This procedure takes approximately one hour. There are no restrictions for this procedure.    Follow-Up: At [facility], you and your health needs are our priority.  As part of our continuing mission to provide you with exceptional heart care, we have created designated Provider Care Teams.  These Care Teams include your primary Cardiologist (physician) and Advanced Practice Providers (APPs -  Physician Assistants and Nurse Practitioners) who all work together to provide you with the care you need, when you need it. . You will need a follow up appointment in 2  Months DEC 2021 - (VIRTUAL VISIT).  Please call our office 2 months in advance to schedule this appointment.  You may see Glenetta Hew, MD or one of the following Advanced Practice Providers on your designated Care Team:   . Rosaria Ferries, PA-C . Jory Sims, DNP, ANP  Any Other Special Instructions Will Be Listed Below (If Applicable).

## 2019-05-14 NOTE — Progress Notes (Signed)
PCP: Maude Leriche, PA-C  Clinic Note: Chief Complaint  Patient presents with   Follow-up    Coronary CTA results   Shortness of Breath    Questionable pulmonary hypertension    HPI:    Christine [name] is a 43 y.o. female who is being seen today for follow-up evaluation for exertional dyspnea (test results) initially seen at the request of Scifres, Earlie Server, PA-C.  Lenita Peregrina was last seen on 02/13/2019 for initial cardiology consultation for exertional dyspnea.  She is a morbidly obese young woman who is currently undergoing evaluation for sleep apnea and CPAP titration.  When I saw her she was noticing symptomatic exertional dyspnea occasionally with chest discomfort.  Was evaluated with a coronary CT angiogram.  Chose not to check 2D echo because she had already had one done a year and a half ago.  Recent Hospitalizations: None  Reviewed  CV studies:   The following studies were reviewed today: (if available, images/films reviewed: From Epic Chart or Care Everywhere)  Cor Ca Score & Cor CTA 04/05/2019 : Ca Score 0. Normal Coronary origin R dominant. No evidence of CAD. ? Liver nodules - poorly visualized (consider MRI w & w/o Gad contrast)  Interval History:    [name] returns today still noticing that she is short of breath.  Her heart rate is also pretty fast and she does notice that this is her normal response to activity.  Recorded heart rate was when she first came in to the clinic room her rate was up in the 118 range.  By the time she had an EKG was done at 109 and on my exam is in the 90s, but still very fast.  She sleeps on several pillows of the bed up a little bit with the sounds of orthopnea but probably related to body habitus and OSA.  Minimal edema.  No notable increased abdominal girth.  She still has exertional shortness of breath and was still breathing hard when I saw her walking into the clinic room.  Shortly after being seated in the room resting, she  was no longer breathing heart until she started talking.  She said that if she shortness of breath, she will have some tightness in her chest but not otherwise.  Sometimes when she breathes really hard she is lightheaded but has not had any syncope or near syncope.  No TIA or amaurosis fugax.  Although her heart rate goes fast she has not noticed it being irregular or skipping.  She also does not notice to be going to fast at rest.  Unfortunately, she is actually gained 20 pounds over the course of the COVID-19 pandemic.  She has not gotten into any type of exercise routine nor has she necessarily done that much adjustment of her diet.  Reviewed Of Systems   ROS: A comprehensive was performed. Review of Systems  Constitutional: Positive for malaise/fatigue (Easily fatigued). Negative for weight loss (Gain).  HENT: Negative for congestion, nosebleeds and sore throat.   Respiratory: Positive for shortness of breath. Negative for cough, sputum production and wheezing.   Cardiovascular: Negative for leg swelling.  Gastrointestinal: Negative for abdominal pain and heartburn.       No increased abdominal girth  Musculoskeletal: Positive for joint pain (Knees bother her when she walks). Negative for falls.  Neurological: Positive for dizziness (When she has to breathe harder). Negative for focal weakness, weakness and headaches.  Psychiatric/Behavioral: Positive for depression. Negative for memory loss. The patient has insomnia.  The patient is not nervous/anxious.   All other systems reviewed and are negative.   The patient does not have symptoms concerning for COVID-19 infection (fever, chills, cough, or new shortness of breath).  The patient is practicing social distancing.  I have reviewed and (if needed) personally updated the patient's problem list, medications, allergies, past medical and surgical history, social and family history.   Past Medical History   Past Medical History:    Diagnosis Date   Anemia    Presumably from menorrhagia/menomenorrhagia; has had both blood and iron transfusions   Anxiety    Back pain    Bipolar 1 disorder ([place])    With depression   Depression    Essential hypertension    Fibromyalgia    Headache    History of blood transfusion 08/2017   [place]   History of hiatal hernia    Infertility, female    Lactose intolerance    Lower extremity edema    Migraines    Morbid obesity (HCC)    Osteoarthritis    Hands, ankles; HLA-B27 positive   PCOS (polycystic ovarian syndrome)    Seasonal allergies    With recurrent allergic rhinitis   Uterine leiomyoma    Vitamin D deficiency      Past Surgical History   Past Surgical History:  Procedure Laterality Date   CARDIAC CT ANGIOGRAM  03/2019    Ca Score 0. Normal Coronary origin R dominant. No evidence of CAD. ? Liver nodules - poorly visualized (consider MRI w & w/o Gad contrast)   CERVICAL POLYPECTOMY  01/15/2018   Procedure: CERVICAL POLYPECTOMY;  Surgeon: Arvella Nigh, MD;  Location: [facility];  Service: Gynecology;;   COLONOSCOPY  2008   Normal   DIAGNOSTIC LAPAROSCOPY  08/1999   dermoid cyst, RSO   DILATION AND CURETTAGE OF UTERUS  05/2004   MAB   DILATION AND CURETTAGE OF UTERUS  04/2015   HYSTEROSCOPY W/D&C  07/22/2011   Procedure: DILATATION AND CURETTAGE /HYSTEROSCOPY;  Surgeon: Cyril Mourning, MD;  Location: [facility];  Service: Gynecology;;   HYSTEROSCOPY W/D&C N/A 01/15/2018   Procedure: DILATATION AND CURETTAGE [place] WITH MYOSURE;  Surgeon: Arvella Nigh, MD;  Location: [facility];  Service: Gynecology;  Laterality: N/A;   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  Suggestion of high cardiac output (possibly related to anemia thyrotoxicosis, Pregnancy, sepsis etc.) -- was in setting of symptomatic anemia - Hgb 5.7     Medications/Allergies   Current Meds   Medication Sig   amLODipine (NORVASC) 10 MG tablet Take 1 tablet (10 mg total) by mouth daily.   ARIPiprazole (ABILIFY) 15 MG tablet Take 1 tablet (15 mg total) by mouth daily.   clonazePAM (KLONOPIN) 0.5 MG tablet Take 1 tablet (0.5 mg total) by mouth daily as needed for anxiety.   Erenumab-aooe (AIMOVIG) 140 MG/ML SOAJ Inject 140 mg into the skin every 30 (thirty) days.   lamoTRIgine (LAMICTAL) 150 MG tablet Take 1 tablet (150 mg total) by mouth 2 (two) times daily. For mood   lithium carbonate 300 MG capsule Take 2 capsule in AM and 3 capsule at bed time   metFORMIN (GLUCOPHAGE) 500 MG tablet Take 1 tablet (500 mg total) by mouth 2 (two) times daily with a meal.   Vitamin D, Ergocalciferol, (DRISDOL) 1.25 MG (50000 UT) CAPS capsule Take 1 capsule (50,000 Units total) by mouth every  7 (seven) days.   [DISCONTINUED] metoprolol succinate (TOPROL-XL) 50 MG 24 hr tablet Take 50 mg by mouth daily. Take with or immediately following a meal.    Allergies  Allergen Reactions   Penicillins Rash and Other (See Comments)    Has patient had a PCN reaction causing immediate rash, facial/tongue/throat swelling, SOB or lightheadedness with hypotension: Yes Has patient had a PCN reaction causing severe rash involving mucus membranes or skin necrosis: Unknown Has patient had a PCN reaction that required hospitalization: No Has patient had a PCN reaction occurring within the last 10 years: No If all of the above answers are "NO", then may proceed with Cephalosporin use.     Social History/Family History   Social History   Tobacco Use   Smoking status: Never Smoker   Smokeless tobacco: Never Used  Substance Use Topics   Alcohol use: Yes    Comment: occasional, maybe once a month or so   Drug use: No   Social History   Social History Narrative   Lives at home with her husband and 2 adopted daughters.   Right handed   Caffeine: decreased intake, drinks rarely    Is on long-term  disability.    family history includes Anxiety disorder in her mother; Cancer in her father and mother; Cancer (age of onset: 75) in her brother; Depression in her mother; Diabetes in her father; Heart disease in her father; High blood pressure in her brother, father, and mother; Hyperlipidemia in her father and mother; Hypertension in her father and mother; Kidney disease in her father; Obesity in her father; Sleep apnea in her father; Stroke in her father.  Wt Readings from Last 3 Encounters:  05/14/19 (!) 342 lb (155.1 kg)  02/22/19 (!) 323 lb (146.5 kg)  02/16/19 (!) 329 lb 6.4 oz (149.4 kg)    Objctive   Physical Exam: BP (!) 146/76    Pulse (!) 118    Temp 97.9 F (36.6 C)    Ht '5\' 5"'$  (1.651 m)    Wt (!) 342 lb (155.1 kg)    SpO2 95%    BMI 56.91 kg/m  Physical Exam  Constitutional: She is oriented to person, place, and time.  Morbidly obese young woman.  No obvious distress.  Well-groomed.  HENT:  Head: Normocephalic and atraumatic.  Neck: Normal range of motion. Neck supple. No hepatojugular reflux and no JVD (Cannot really assess) present. Carotid bruit is not present.  Cardiovascular: Regular rhythm, S1 normal and S2 normal.  No extrasystoles are present. Tachycardia present. PMI is not displaced (Cannot palpate). Exam reveals distant heart sounds and decreased pulses (Unable to really palpate pedal pulses very well.  There are present). Exam reveals no gallop and no friction rub.  Murmur (1/6 SEM at RUSB) heard. Pulmonary/Chest: Breath sounds normal. She is in respiratory distress (Not really distressed, but just breathing hard upon walking back to the clinic room). She has no wheezes. She has no rales. She exhibits tenderness (Along the sternal border).  Distant breath sounds.  Abdominal: Soft. Bowel sounds are normal. She exhibits no distension. There is no abdominal tenderness. There is no rebound.  Obese.  Unable to palpate HSM  Musculoskeletal: Normal range of motion.          General: Edema (Probably has 1+ at least edema, but unable to tell if this is edema or obesity.) present.  Neurological: She is alert and oriented to person, place, and time.  Psychiatric: Judgment and thought content normal.  Somewhat tearful.  Very upset about her current condition and inability to lose weight etc.  Vitals reviewed.   Adult ECG Report  Rate: 109 ;  Rhythm: sinus tachycardia and indeterminate; normal axis, intervals and durations  Narrative Interpretation: Normal EKG was sinus tachycardia  Recent Labs:   Lab Results  Component Value Date   CREATININE 0.87 02/16/2019   BUN 13 02/16/2019   NA 140 02/16/2019   K 3.7 02/16/2019   CL 108 02/16/2019   CO2 22 02/16/2019   Lab Results  Component Value Date   CHOL 183 10/07/2018   HDL 38 (L) 10/07/2018   LDLCALC 131 (H) 10/07/2018   TRIG 70 10/07/2018  -- Needs to be addressed in future.  CBC Latest Ref Rng & Units 04/19/2019 03/19/2019 02/23/2019  WBC 4.0 - 10.5 K/uL 5.9 6.4 10.6(H)  Hemoglobin 12.0 - 15.0 g/dL 10.9(L) 11.1(L) 9.2(L)  Hematocrit 36.0 - 46.0 % 35.6(L) 37.6 33.3(L)  Platelets 150 - 400 K/uL 241 317 345    Assessment / Plan    Problem List Items Addressed This Visit    Sinus tachycardia (Chronic)    She remains tachycardic which is likely related to deconditioning and obesity. For now plan is to increase Toprol to 100 mg.  Could also consider switching from amlodipine to diltiazem.  One third option would be considering Corlaner if the other options did not work.      HTN (hypertension) (Chronic)    Blood pressure is little high today and she is tachycardic.  Plan for now will be to increase Toprol to 100 mg daily.  Continue amlodipine.  We will also add low-dose ARB today.      Relevant Medications   metoprolol succinate (TOPROL-XL) 100 MG 24 hr tablet   olmesartan (BENICAR) 5 MG tablet   Class 3 obesity with alveolar hypoventilation, serious comorbidity, and body mass index (BMI) of  50.0 to 59.9 in adult [facility]) (Chronic)    I would suspect that her exertional dyspnea is related to body habitus, deconditioning, obesity with restrictive lung disease, OHS etc.  She really needs to lose weight.  We can evaluate for pulmonary hypertension as a side effect and treat potentially, but ultimately weight loss is what she needs to do.  She is at risk of essentially having PICKWICKIAN SYNDROME.      Relevant Orders   EKG 12-Lead   Atypical chest pain    Very reassuring coronary CTA results.  She could have chest discomfort from elevated pulmonary pressures, but more likely this is musculoskeletal discomfort.      Pulmonary hypertension, unspecified ([place]) - Primary    Majority the time was spent talking to her about what pulmonary hypertension means, and how it is diagnosed.  The lack of edema or abdominal girth/swelling would argue against right-sided cor pulmonale from pulmonary hypertension.  Most of the likely, if that is present, pulmonary hypertension related to obesity/OHS and OSA.  I explained the pathophysiology of this to her.  We will start off with rechecking a 2D echocardiogram with hopes that we can get a focus on the right ventricular size and tricuspid regurgitation to assess pulmonary pressures.  If the right atrial size is normal and right ventricle is not dilated, I would suspect that there is not significant pulmonary hypertension, however if there is a suggestion of it based on the echo, we could consider right heart catheterization for diagnostic purposes.  She is currently on amlodipine as a calcium channel blocker which we  could switch to diltiazem for rate control plus pulmonary vasodilation.  However, if right heart cath showed significant pulmonary hypertension, we could consider referral for more aggressive management.       Relevant Medications   metoprolol succinate (TOPROL-XL) 100 MG 24 hr tablet   olmesartan (BENICAR) 5 MG tablet   Other Relevant  Orders   EKG 12-Lead   ECHOCARDIOGRAM COMPLETE   Functional systolic murmur    Echocardiogram in 2019 did not show any valvular lesions.  She does have a murmur which is probably a flow murmur.  However with [place] trying to evaluate pulmonary hypertension we can recheck a 2D echocardiogram that can also reassess for a cause of a murmur.      Relevant Orders   EKG 12-Lead   ECHOCARDIOGRAM COMPLETE      COVID-19 Education: The signs and symptoms of COVID-19 were discussed with the patient and how to seek care for testing (follow up with PCP or arrange E-visit).   The importance of social distancing was discussed today.  I spent a total of 22 minutes with the patient and chart review. >  50% of the time was spent in direct patient consultation.  Additional time spent with chart review (studies, outside notes, etc): 4 Total Time: 26 min   Current medicines are reviewed at length with the patient today.  (+/- concerns) Asked more about ? Pulmonary HTN --   Patient Instructions / Medication Changes & Studies & Tests Ordered   Patient Instructions  Medication Instructions:  INCREASE METOPROLOL SUCCINATE TO 100 MG DAILY  START  OLMESARTAN 5 MG ONE TABLET DAILY    If you need a refill on your cardiac medications before your next appointment, please call your pharmacy.   Lab work:  If you have labs (blood work) drawn today and your tests are completely normal, you will receive your results only by:  [place] (if you have MyChart) OR  A paper copy in the mail If you have any lab test that is abnormal or we need to change your treatment, we will call you to review the results.  Testing/Procedures: Will be schedule at [employer] [facility]  [address] has requested that you have an echocardiogram. Echocardiography is a painless test that uses sound waves to create images of your heart. It provides your doctor with information about the size  and shape of your heart and how well your hearts chambers and valves are working. This procedure takes approximately one hour. There are no restrictions for this procedure.    Follow-Up: At [facility], you and your health needs are our priority.  As part of our continuing mission to provide you with exceptional heart care, we have created designated Provider Care Teams.  These Care Teams include your primary Cardiologist (physician) and Advanced Practice Providers (APPs -  Physician Assistants and Nurse Practitioners) who all work together to provide you with the care you need, when you need it.  You will need a follow up appointment in 2  Months DEC 2021 - (VIRTUAL VISIT).  Please call our office 2 months in advance to schedule this appointment.  You may see Glenetta Hew, MD or one of the following Advanced Practice Providers on your designated Care Team:    Rosaria Ferries, PA-C  Jory Sims, DNP, ANP  Any Other Special Instructions Will Be Listed Below (If Applicable).   Studies Ordered:   Orders Placed This Encounter  Procedures  EKG 12-Lead   ECHOCARDIOGRAM COMPLETE     Glenetta Hew, M.D., M.S. Interventional Cardiologist   Pager # [phone] Phone # [phone] [address]. [place], [place] [address]   Thank you for choosing Heartcare at [facility]!!

## 2019-05-17 ENCOUNTER — Encounter: Payer: Self-pay | Admitting: Cardiology

## 2019-05-17 DIAGNOSIS — R000 Tachycardia, unspecified: Secondary | ICD-10-CM | POA: Insufficient documentation

## 2019-05-17 NOTE — Assessment & Plan Note (Signed)
She remains tachycardic which is likely related to deconditioning and obesity. For now plan is to increase Toprol to 100 mg.  Could also consider switching from amlodipine to diltiazem.  One third option would be considering Corlaner if the other options did not work.

## 2019-05-17 NOTE — Assessment & Plan Note (Addendum)
I would suspect that her exertional dyspnea is related to body habitus, deconditioning, obesity with restrictive lung disease, OHS etc.  She really needs to lose weight.  We can evaluate for pulmonary hypertension as a side effect and treat potentially, but ultimately weight loss is what she needs to do.  She is at risk of essentially having PICKWICKIAN SYNDROME.

## 2019-05-17 NOTE — Assessment & Plan Note (Signed)
Very reassuring coronary CTA results.  She could have chest discomfort from elevated pulmonary pressures, but more likely this is musculoskeletal discomfort.

## 2019-05-17 NOTE — Assessment & Plan Note (Addendum)
33 the time was spent talking to her about what pulmonary hypertension means, and how it is diagnosed.  The lack of edema or abdominal girth/swelling would argue against right-sided cor pulmonale from pulmonary hypertension.  Most of the likely, if that is present, pulmonary hypertension related to obesity/OHS and OSA.  I explained the pathophysiology of this to her.  We will start off with rechecking a 2D echocardiogram with hopes that we can get a focus on the right ventricular size and tricuspid regurgitation to assess pulmonary pressures.  If the right atrial size is normal and right ventricle is not dilated, I would suspect that there is not significant pulmonary hypertension, however if there is a suggestion of it based on the echo, we could consider right heart catheterization for diagnostic purposes.  She is currently on amlodipine as a calcium channel blocker which we could switch to diltiazem for rate control plus pulmonary vasodilation.  However, if right heart cath showed significant pulmonary hypertension, we could consider referral for more aggressive management.

## 2019-05-17 NOTE — Assessment & Plan Note (Addendum)
Blood pressure is little high today and she is tachycardic.  Plan for now will be to increase Toprol to 100 mg daily.  Continue amlodipine.  We will also add low-dose ARB today.

## 2019-05-17 NOTE — Assessment & Plan Note (Addendum)
Echocardiogram in 2019 did not show any valvular lesions.  She does have a murmur which is probably a flow murmur.  However with [place] trying to evaluate pulmonary hypertension we can recheck a 2D echocardiogram that can also reassess for a cause of a murmur.

## 2019-05-18 ENCOUNTER — Telehealth: Payer: Self-pay | Admitting: Nurse Practitioner

## 2019-05-18 NOTE — Telephone Encounter (Signed)
Returned patient's phone call regarding rescheduling an appointment, left a voicemail. 

## 2019-05-18 NOTE — Progress Notes (Deleted)
[place]   [phone] [phone]   Clinic Follow up Note   Patient Care Team: Scifres, Durel Salts as PCP - General (Physician Assistant) [name], MD as PCP - Cardiology (Cardiology) 05/18/2019  CHIEF COMPLAINT: F/u IDA  CURRENT THERAPY: IV Feraheme PRN, last given 04/19/2019   INTERVAL HISTORY: Christine [name] returns for f/u as scheduled. She received IV Feraheme twice in July and once in August and September.    REVIEW OF SYSTEMS:   Constitutional: Denies fevers, chills or abnormal weight loss Eyes: Denies blurriness of vision Ears, nose, mouth, throat, and face: Denies mucositis or sore throat Respiratory: Denies cough, dyspnea or wheezes Cardiovascular: Denies palpitation, chest discomfort or lower extremity swelling Gastrointestinal:  Denies nausea, heartburn or change in bowel habits Skin: Denies abnormal skin rashes Lymphatics: Denies new lymphadenopathy or easy bruising Neurological:Denies numbness, tingling or new weaknesses Behavioral/Psych: Mood is stable, no new changes  All other systems were reviewed with the patient and are negative.  MEDICAL HISTORY:  Past Medical History:  Diagnosis Date  . Anemia    Presumably from menorrhagia/menomenorrhagia; has had both blood and iron transfusions  . Anxiety   . Back pain   . Bipolar 1 disorder ([place])    With depression  . Depression   . Essential hypertension   . Fibromyalgia   . Headache   . History of blood transfusion 08/2017   [facility]  . History of hiatal hernia   . Infertility, female   . Lactose intolerance   . Lower extremity edema   . Migraines   . Morbid obesity ([place])   . Osteoarthritis    Hands, ankles; HLA-B27 positive  . PCOS (polycystic ovarian syndrome)   . Seasonal allergies    With recurrent allergic rhinitis  . Uterine leiomyoma   . Vitamin D deficiency     SURGICAL HISTORY: Past Surgical History:  Procedure Laterality Date  . CARDIAC CT  ANGIOGRAM  03/2019    Ca Score 0. Normal Coronary origin R dominant. No evidence of CAD. ? Liver nodules - poorly visualized (consider MRI w & w/o Gad contrast)  . CERVICAL POLYPECTOMY  01/15/2018   Procedure: CERVICAL POLYPECTOMY;  Surgeon: Arvella Nigh, MD;  Location: [facility];  Service: Gynecology;;  . COLONOSCOPY  2008   Normal  . DIAGNOSTIC LAPAROSCOPY  08/1999   dermoid cyst, RSO  . DILATION AND CURETTAGE OF UTERUS  05/2004   MAB  . DILATION AND CURETTAGE OF UTERUS  04/2015  . HYSTEROSCOPY W/D&C  07/22/2011   Procedure: DILATATION AND CURETTAGE /HYSTEROSCOPY;  Surgeon: Cyril Mourning, MD;  Location: [facility];  Service: Gynecology;;  . HYSTEROSCOPY W/D&C N/A 01/15/2018   Procedure: DILATATION AND CURETTAGE [place] WITH MYOSURE;  Surgeon: Arvella Nigh, MD;  Location: [facility];  Service: Gynecology;  Laterality: N/A;  . 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 TRANSTHORACIC ECHOCARDIOGRAM  08/2017    EF 65 to 70% with vigorous wall motion.  Suggestion of high cardiac output (possibly related to anemia thyrotoxicosis, Pregnancy, sepsis etc.) -- was in setting of symptomatic anemia - Hgb 5.7    I have reviewed the social history and family history with the patient and they are unchanged from previous note.  ALLERGIES:  is allergic to penicillins.  MEDICATIONS:  Current Outpatient Medications  Medication Sig Dispense Refill  . amLODipine (NORVASC) 10 MG tablet Take 1 tablet (10 mg total) by mouth daily. 30 tablet 0  . ARIPiprazole (ABILIFY) 15 MG  tablet Take 1 tablet (15 mg total) by mouth daily. 30 tablet 2  . clonazePAM (KLONOPIN) 0.5 MG tablet Take 1 tablet (0.5 mg total) by mouth daily as needed for anxiety. 30 tablet 0  . Erenumab-aooe (AIMOVIG) 140 MG/ML SOAJ Inject 140 mg into the skin every 30 (thirty) days. 1 pen 11  . lamoTRIgine (LAMICTAL) 150 MG tablet Take 1 tablet (150 mg total) by mouth 2 (two) times daily. For mood 60 tablet 2  . lithium carbonate 300 MG capsule  Take 2 capsule in AM and 3 capsule at bed time 150 capsule 2  . metFORMIN (GLUCOPHAGE) 500 MG tablet Take 1 tablet (500 mg total) by mouth 2 (two) times daily with a meal. 60 tablet 0  . metoprolol succinate (TOPROL-XL) 100 MG 24 hr tablet Take 1 tablet (100 mg total) by mouth daily. Take with or immediately following a meal. 90 tablet 3  . olmesartan (BENICAR) 5 MG tablet Take 1 tablet (5 mg total) by mouth daily. 30 tablet 6  . Vitamin D, Ergocalciferol, (DRISDOL) 1.25 MG (50000 UT) CAPS capsule Take 1 capsule (50,000 Units total) by mouth every 7 (seven) days. 4 capsule 0   No current facility-administered medications for this visit.     PHYSICAL EXAMINATION: ECOG PERFORMANCE STATUS: {CHL ONC ECOG PS:[id number]}  There were no vitals filed for this visit. There were no vitals filed for this visit.  GENERAL:alert, no distress and comfortable SKIN: skin color, texture, turgor are normal, no rashes or significant lesions EYES: normal, Conjunctiva are pink and non-injected, sclera clear OROPHARYNX:no exudate, no erythema and lips, buccal mucosa, and tongue normal  NECK: supple, thyroid normal size, non-tender, without nodularity LYMPH:  no palpable lymphadenopathy in the cervical, axillary or inguinal LUNGS: clear to auscultation and percussion with normal breathing effort HEART: regular rate & rhythm and no murmurs and no lower extremity edema ABDOMEN:abdomen soft, non-tender and normal bowel sounds Musculoskeletal:no cyanosis of digits and no clubbing  NEURO: alert & oriented x 3 with fluent speech, no focal motor/sensory deficits  LABORATORY DATA:  I have reviewed the data as listed CBC Latest Ref Rng & Units 04/19/2019 03/19/2019 02/23/2019  WBC 4.0 - 10.5 K/uL 5.9 6.4 10.6(H)  Hemoglobin 12.0 - 15.0 g/dL 10.9(L) 11.1(L) 9.2(L)  Hematocrit 36.0 - 46.0 % 35.6(L) 37.6 33.3(L)  Platelets 150 - 400 K/uL 241 317 345     CMP Latest Ref Rng & Units 02/16/2019 01/30/2019 10/07/2018  Glucose  70 - 99 mg/dL 119(H) 93 78  BUN 6 - 20 mg/dL _0 Creatinine 0.44 - 1.00 mg/dL 0.87 0.69 0.77  Sodium 135 - 145 mmol/L 140 137 138  Potassium 3.5 - 5.1 mmol/L 3.7 3.8 4.4  Chloride 98 - 111 mmol/L 108 107 104  CO2 22 - 32 mmol/L 22 20(L) 21  Calcium 8.9 - 10.3 mg/dL 8.9 9.3 9.5  Total Protein 6.5 - 8.1 g/dL 7.3 - 7.0  Total Bilirubin 0.3 - 1.2 mg/dL 0.4 - 0.4  Alkaline Phos 38 - 126 U/L 66 - 65  AST 15 - 41 U/L 9(L) - 11  ALT 0 - 44 U/L 14 - 13      RADIOGRAPHIC STUDIES: I have personally reviewed the radiological images as listed and agreed with the findings in the report. No results found.   ASSESSMENT & PLAN:  No problem-specific Assessment & Plan notes found for this encounter.   No orders of the defined types were placed in this encounter.  All questions were  answered. The patient knows to call the clinic with any problems, questions or concerns. No barriers to learning was detected. I spent {CHL ONC TIME VISIT - BJYNW:[id number]} counseling the patient face to face. The total time spent in the appointment was {CHL ONC TIME VISIT - VHQIO:[id number]} and more than 50% was on counseling and review of test results     Alla Feeling, NP 05/18/19

## 2019-05-19 ENCOUNTER — Inpatient Hospital Stay: Payer: Medicare Other | Admitting: Nurse Practitioner

## 2019-05-19 ENCOUNTER — Inpatient Hospital Stay: Payer: Medicare Other | Attending: Nurse Practitioner

## 2019-05-19 ENCOUNTER — Inpatient Hospital Stay: Payer: Medicare Other

## 2019-05-19 DIAGNOSIS — I10 Essential (primary) hypertension: Secondary | ICD-10-CM | POA: Insufficient documentation

## 2019-05-19 DIAGNOSIS — N920 Excessive and frequent menstruation with regular cycle: Secondary | ICD-10-CM | POA: Insufficient documentation

## 2019-05-19 DIAGNOSIS — D500 Iron deficiency anemia secondary to blood loss (chronic): Secondary | ICD-10-CM | POA: Insufficient documentation

## 2019-05-19 DIAGNOSIS — Z79899 Other long term (current) drug therapy: Secondary | ICD-10-CM | POA: Insufficient documentation

## 2019-05-21 ENCOUNTER — Telehealth: Payer: Self-pay | Admitting: Hematology

## 2019-05-21 NOTE — Telephone Encounter (Signed)
Left message re 10/26 appointment. Scheduled mailed.

## 2019-05-30 NOTE — Progress Notes (Signed)
[place]   [phone] [phone]) [phone]   Clinic Follow up Note   Patient Care Team: Scifres, Durel Salts as PCP - General (Physician Assistant) [name], MD as PCP - Cardiology (Cardiology) 05/31/2019  CHIEF COMPLAINT: f/u iron deficiency anemia   CURRENT THERAPY: IV Feraheme PRN, last given 01/29/2018, 02/2019 x2, 03/2019, 04/2019  INTERVAL HISTORY: Mr. Convey returns for f/u as scheduled. She was last seen in 02/2019, has received IV feraheme monthly from July to September. Her fatigue resolved. She did not feel worse after Feraheme. She continues to have dyspnea on exertion that she attributes to weight. Denies cough or chest pain. Denies palpitation. Denies headache. She met with Dr. Ellyn Hack, she is still undergoing work up for pulmonary HTN. She is not working, stays at home. 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 She continues to have heavy menses, LMP 2 weeks ago. She is ultimately planning to have a hysterectomy. Denies other bleeding such as epistaxis or GI bleeding. She has recurrent right ankle swelling, no calf pain. She is bipolar, has more anxiety and depression lately. She is trying to be more consistent with therapy recently. She is on medication. Denies SI.     MEDICAL HISTORY:  Past Medical History:  Diagnosis Date  . Anemia    Presumably from menorrhagia/menomenorrhagia; has had both blood and iron transfusions  . Anxiety   . Back pain   . Bipolar 1 disorder ([place])    With depression  . Depression   . Essential hypertension   . Fibromyalgia   . Headache   . History of blood transfusion 08/2017   [facility]  . History of hiatal hernia   . Infertility, female   . Lactose intolerance   . Lower extremity edema   . Migraines   . Morbid obesity ([place])   . Osteoarthritis    Hands, ankles; HLA-B27 positive  . PCOS (polycystic ovarian syndrome)   . Seasonal allergies    With recurrent allergic rhinitis  . Uterine leiomyoma    . Vitamin D deficiency     SURGICAL HISTORY: Past Surgical History:  Procedure Laterality Date  . CARDIAC CT ANGIOGRAM  03/2019    Ca Score 0. Normal Coronary origin R dominant. No evidence of CAD. ? Liver nodules - poorly visualized (consider MRI w & w/o Gad contrast)  . CERVICAL POLYPECTOMY  01/15/2018   Procedure: CERVICAL POLYPECTOMY;  Surgeon: Arvella Nigh, MD;  Location: [facility];  Service: Gynecology;;  . COLONOSCOPY  2008   Normal  . DIAGNOSTIC LAPAROSCOPY  08/1999   dermoid cyst, RSO  . DILATION AND CURETTAGE OF UTERUS  05/2004   MAB  . DILATION AND CURETTAGE OF UTERUS  04/2015  . HYSTEROSCOPY W/D&C  07/22/2011   Procedure: DILATATION AND CURETTAGE /HYSTEROSCOPY;  Surgeon: Cyril Mourning, MD;  Location: [facility];  Service: Gynecology;;  . HYSTEROSCOPY W/D&C N/A 01/15/2018   Procedure: DILATATION AND CURETTAGE [place] WITH MYOSURE;  Surgeon: Arvella Nigh, MD;  Location: [facility];  Service: Gynecology;  Laterality: N/A;  . 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 TRANSTHORACIC ECHOCARDIOGRAM  08/2017    EF 65 to 70% with vigorous wall motion.  Suggestion of high cardiac output (possibly related to anemia thyrotoxicosis, Pregnancy, sepsis etc.) -- was in setting of symptomatic anemia - Hgb 5.7    I have reviewed the social history and family history with the patient and they are unchanged from previous note.  ALLERGIES:  is allergic  to penicillins.  MEDICATIONS:  Current Outpatient Medications  Medication Sig Dispense Refill  . amLODipine (NORVASC) 10 MG tablet Take 1 tablet (10 mg total) by mouth daily. 30 tablet 0  . ARIPiprazole (ABILIFY) 15 MG tablet Take 1 tablet (15 mg total) by mouth daily. 30 tablet 2  . clonazePAM (KLONOPIN) 0.5 MG tablet Take 1 tablet (0.5 mg total) by mouth daily as needed for anxiety. 30 tablet 0  . Erenumab-aooe (AIMOVIG) 140 MG/ML SOAJ Inject 140 mg into the skin every 30 (thirty) days. 1 pen 11  . lamoTRIgine (LAMICTAL) 150 MG tablet  Take 1 tablet (150 mg total) by mouth 2 (two) times daily. For mood 60 tablet 2  . lithium carbonate 300 MG capsule Take 2 capsule in AM and 3 capsule at bed time 150 capsule 2  . metFORMIN (GLUCOPHAGE) 500 MG tablet Take 1 tablet (500 mg total) by mouth 2 (two) times daily with a meal. 60 tablet 0  . metoprolol succinate (TOPROL-XL) 100 MG 24 hr tablet Take 1 tablet (100 mg total) by mouth daily. Take with or immediately following a meal. 90 tablet 3  . olmesartan (BENICAR) 5 MG tablet Take 1 tablet (5 mg total) by mouth daily. 30 tablet 6  . Vitamin D, Ergocalciferol, (DRISDOL) 1.25 MG (50000 UT) CAPS capsule Take 1 capsule (50,000 Units total) by mouth every 7 (seven) days. 4 capsule 0   No current facility-administered medications for this visit.     PHYSICAL EXAMINATION:  Vitals:   05/31/19 1348 05/31/19 1351  BP: (!) 152/113 (!) 152/91  Pulse: 97   Resp: (!) 24   Temp: 98.3 F (36.8 C)   SpO2: 97%    Filed Weights   05/31/19 1348  Weight: (!) 341 lb 1.6 oz (154.7 kg)    GENERAL:alert, no distress and comfortable SKIN: no rash  LUNGS: clear with normal breathing effort HEART: regular rate & rhythm, murmur noted ABDOMEN:abdomen soft, non-tender and normal bowel sounds Musculoskeletal: mild right ankle edema  NEURO: alert & oriented x 3 with fluent speech   LABORATORY DATA:  I have reviewed the data as listed CBC Latest Ref Rng & Units 05/31/2019 04/19/2019 03/19/2019  WBC 4.0 - 10.5 K/uL 8.5 5.9 6.4  Hemoglobin 12.0 - 15.0 g/dL 12.4 10.9(L) 11.1(L)  Hematocrit 36.0 - 46.0 % 38.0 35.6(L) 37.6  Platelets 150 - 400 K/uL 320 241 317     CMP Latest Ref Rng & Units 02/16/2019 01/30/2019 10/07/2018  Glucose 70 - 99 mg/dL 119(H) 93 78  BUN 6 - 20 mg/dL '13 14 10  '$ Creatinine 0.44 - 1.00 mg/dL 0.87 0.69 0.77  Sodium 135 - 145 mmol/L 140 137 138  Potassium 3.5 - 5.1 mmol/L 3.7 3.8 4.4  Chloride 98 - 111 mmol/L 108 107 104  CO2 22 - 32 mmol/L 22 20(L) 21  Calcium 8.9 - 10.3  mg/dL 8.9 9.3 9.5  Total Protein 6.5 - 8.1 g/dL 7.3 - 7.0  Total Bilirubin 0.3 - 1.2 mg/dL 0.4 - 0.4  Alkaline Phos 38 - 126 U/L 66 - 65  AST 15 - 41 U/L 9(L) - 11  ALT 0 - 44 U/L 14 - 13      RADIOGRAPHIC STUDIES: I have personally reviewed the radiological images as listed and agreed with the findings in the report. No results found.   ASSESSMENT & PLAN: Keliah Harned is a pleasant 43 yo AAF with history of uterine fibroids, polyps, PCOS, obesity, HTN, and arthritis found to have severe iron  deficiency anemia secondary to chronic blood loss   1. 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 Ellyn Hack recently added olmesartan -BP 152/91 -f/u with PCP, cardiology   3.Overall health and wellness -currently under the care of weight loss provider at wellness center, PCP, cardiology, and GYN -I encouraged her to eat well and exercise as tolerated  4. Bipolar -On aripiprazole, clonazepam, lamotrigine, lithium -more anxiety and depression lately -she is under mental health care and is getting more consistent with therapy lately -Denies SI  Disposition:  Ms. Delancey appears stable. She has received monthly IV Feraheme x3. Her fatigue resolved, but she remains to have dyspnea on exertion, which is likely related to her sedentary lifestyle and obesity. Her anemia resolved. She responded well to IV iron. Ferritin on 04/19/19 was 27, serum iron 41, levels are pending today. She does not tolerate oral iron. Given last months labs and that she continues to have  heavy menses, I recommend to proceed with IV Feraheme today x1 which is already scheduled. We will continue to monitor labs monthly and arrange IV Feraheme as needed. F/u in 4 months. She will continue f/u with cardiology, GYN, and mental health.   All questions were answered. The patient knows to call the clinic with any problems, questions or concerns. No barriers to learning was detected.     Alla Feeling, NP 05/31/19

## 2019-05-31 ENCOUNTER — Inpatient Hospital Stay (HOSPITAL_BASED_OUTPATIENT_CLINIC_OR_DEPARTMENT_OTHER): Payer: Medicare Other | Admitting: Nurse Practitioner

## 2019-05-31 ENCOUNTER — Encounter: Payer: Self-pay | Admitting: Nurse Practitioner

## 2019-05-31 ENCOUNTER — Inpatient Hospital Stay: Payer: Medicare Other

## 2019-05-31 ENCOUNTER — Other Ambulatory Visit: Payer: Self-pay

## 2019-05-31 VITALS — BP 123/76 | HR 75

## 2019-05-31 VITALS — BP 152/91 | HR 97 | Temp 98.3°F | Resp 24 | Ht 65.0 in | Wt 341.1 lb

## 2019-05-31 DIAGNOSIS — Z79899 Other long term (current) drug therapy: Secondary | ICD-10-CM | POA: Diagnosis not present

## 2019-05-31 DIAGNOSIS — D500 Iron deficiency anemia secondary to blood loss (chronic): Secondary | ICD-10-CM

## 2019-05-31 DIAGNOSIS — N920 Excessive and frequent menstruation with regular cycle: Secondary | ICD-10-CM | POA: Diagnosis not present

## 2019-05-31 DIAGNOSIS — I10 Essential (primary) hypertension: Secondary | ICD-10-CM | POA: Diagnosis not present

## 2019-05-31 LAB — CBC WITH DIFFERENTIAL (CANCER CENTER ONLY)
Abs Immature Granulocytes: 0.06 10*3/uL (ref 0.00–0.07)
Basophils Absolute: 0 10*3/uL (ref 0.0–0.1)
Basophils Relative: 0 %
Eosinophils Absolute: 0.1 10*3/uL (ref 0.0–0.5)
Eosinophils Relative: 1 %
HCT: 38 % (ref 36.0–46.0)
Hemoglobin: 12.4 g/dL (ref 12.0–15.0)
Immature Granulocytes: 1 %
Lymphocytes Relative: 23 %
Lymphs Abs: 1.9 10*3/uL (ref 0.7–4.0)
MCH: 29.5 pg (ref 26.0–34.0)
MCHC: 32.6 g/dL (ref 30.0–36.0)
MCV: 90.3 fL (ref 80.0–100.0)
Monocytes Absolute: 0.7 10*3/uL (ref 0.1–1.0)
Monocytes Relative: 9 %
Neutro Abs: 5.6 10*3/uL (ref 1.7–7.7)
Neutrophils Relative %: 66 %
Platelet Count: 320 10*3/uL (ref 150–400)
RBC: 4.21 MIL/uL (ref 3.87–5.11)
RDW: 15 % (ref 11.5–15.5)
WBC Count: 8.5 10*3/uL (ref 4.0–10.5)
nRBC: 0 % (ref 0.0–0.2)

## 2019-05-31 LAB — IRON AND TIBC
Iron: 42 ug/dL (ref 41–142)
Saturation Ratios: 12 % — ABNORMAL LOW (ref 21–57)
TIBC: 360 ug/dL (ref 236–444)
UIBC: 319 ug/dL (ref 120–384)

## 2019-05-31 LAB — FERRITIN: Ferritin: 10 ng/mL — ABNORMAL LOW (ref 11–307)

## 2019-05-31 MED ORDER — FAMOTIDINE IN NACL 20-0.9 MG/50ML-% IV SOLN
20.0000 mg | Freq: Once | INTRAVENOUS | Status: AC
  Administered 2019-05-31: 20 mg via INTRAVENOUS

## 2019-05-31 MED ORDER — DIPHENHYDRAMINE HCL 50 MG/ML IJ SOLN
12.5000 mg | Freq: Once | INTRAMUSCULAR | Status: AC
  Administered 2019-05-31: 12.5 mg via INTRAVENOUS

## 2019-05-31 MED ORDER — DIPHENHYDRAMINE HCL 50 MG/ML IJ SOLN
INTRAMUSCULAR | Status: AC
  Filled 2019-05-31: qty 1

## 2019-05-31 MED ORDER — SODIUM CHLORIDE 0.9 % IV SOLN
510.0000 mg | Freq: Once | INTRAVENOUS | Status: AC
  Administered 2019-05-31: 510 mg via INTRAVENOUS
  Filled 2019-05-31: qty 510

## 2019-05-31 MED ORDER — FAMOTIDINE IN NACL 20-0.9 MG/50ML-% IV SOLN
INTRAVENOUS | Status: AC
  Filled 2019-05-31: qty 50

## 2019-05-31 MED ORDER — SODIUM CHLORIDE 0.9 % IV SOLN
Freq: Once | INTRAVENOUS | Status: AC
  Administered 2019-05-31: 16:00:00 via INTRAVENOUS
  Filled 2019-05-31: qty 250

## 2019-05-31 NOTE — Patient Instructions (Signed)

## 2019-06-01 ENCOUNTER — Telehealth: Payer: Self-pay | Admitting: Nurse Practitioner

## 2019-06-01 ENCOUNTER — Telehealth: Payer: Self-pay

## 2019-06-01 NOTE — Telephone Encounter (Signed)
-----   Message from Alla Feeling, NP sent at 05/31/2019  8:04 PM EDT ----- Please let her know iron labs. Although her Hgb is normal now, her iron has not maintained normal level. I recommend a second dose of Feraheme next week. I'll send schedule message.  Thanks, Regan Rakers NP

## 2019-06-01 NOTE — Telephone Encounter (Signed)
Scheduled appt per 10/26 los.  Sent a staff message and a calendar will be mailed out.

## 2019-06-01 NOTE — Telephone Encounter (Signed)
Scheduled appt per 10/26 sch message - unable to reach pt . Left message with appt date and time

## 2019-06-01 NOTE — Telephone Encounter (Signed)
Left voice message on patient identified voice mail box with lab results.  Per Christine Rue NP informed her hemoglobin is normal however iron level is not normal.  She is recommending a second dose of Feraheme next week.  A scheduling message has been sent and informed patient to expect a phone call with date/time.  Encouraged her to call back if she has any questions.

## 2019-06-02 ENCOUNTER — Other Ambulatory Visit (HOSPITAL_COMMUNITY): Payer: Medicare Other

## 2019-06-05 ENCOUNTER — Encounter: Payer: Self-pay | Admitting: Nurse Practitioner

## 2019-06-07 ENCOUNTER — Other Ambulatory Visit: Payer: Self-pay

## 2019-06-07 ENCOUNTER — Ambulatory Visit (INDEPENDENT_AMBULATORY_CARE_PROVIDER_SITE_OTHER): Payer: Medicare Other | Admitting: Psychiatry

## 2019-06-07 DIAGNOSIS — F3162 Bipolar disorder, current episode mixed, moderate: Secondary | ICD-10-CM | POA: Diagnosis not present

## 2019-06-07 NOTE — Progress Notes (Signed)
Virtual Visit via Video Note  I connected with Christine [name] on 06/07/19 at  3:00 PM EST by a video enabled telemedicine application and verified that I am speaking with the correct person using two identifiers.  Location: Patient: Christine [name] Provider: Lise Auer, LCSW   I discussed the limitations of evaluation and management by telemedicine and the availability of in person appointments. The patient expressed understanding and agreed to proceed.  History of Present Illness: Bipolar 1 DO   Observations/Objective: Counselor met with Christine [name] for individual therapy via Webex. Counselor assessed MH symptoms and progress on treatment plan goals.Christine [name] presented with moderate depression and moderate anxiety. Christine [name] denied suicidal ideation or self-harm behaviors. Christine [name] shared that she is having challenges with creating a daily routine is beneficial for her mental and emotional health. Counselor explored daily functioning and current life stressors. Counselor and Christine [name] spent time in session working on a daily routine that incorporated self-care, alone time, family time, recreational time and time to complete needed tasks on to do list. Counselor provided psychoeducation on a variety of coping strategies, organizational tips and self-help books/resources. Christine [name] reported feeling better about implementing the new routine and plans on communicating with her family about her new goals.   Assessment and Plan: Counselor will continue to meet with patient to address treatment plan goals. Patient will continue to follow recommendations of providers and implement skills learned in session.  Follow Up Instructions: Counselor will send information for next session via Webex.     I discussed the assessment and treatment plan with the patient. The patient was provided an opportunity to ask questions and all were answered. The patient agreed with the plan and demonstrated an understanding of the  instructions.   The patient was advised to call back or seek an in-person evaluation if the symptoms worsen or if the condition fails to improve as anticipated.  I provided 60 minutes of non-face-to-face time during this encounter.   Lise Auer, LCSW

## 2019-06-08 ENCOUNTER — Other Ambulatory Visit: Payer: Self-pay | Admitting: Nurse Practitioner

## 2019-06-08 ENCOUNTER — Inpatient Hospital Stay: Payer: Medicare Other | Attending: Nurse Practitioner

## 2019-06-08 ENCOUNTER — Other Ambulatory Visit: Payer: Self-pay

## 2019-06-08 VITALS — BP 132/68 | HR 81 | Temp 98.6°F | Resp 20

## 2019-06-08 DIAGNOSIS — I10 Essential (primary) hypertension: Secondary | ICD-10-CM | POA: Diagnosis not present

## 2019-06-08 DIAGNOSIS — Z79899 Other long term (current) drug therapy: Secondary | ICD-10-CM | POA: Insufficient documentation

## 2019-06-08 DIAGNOSIS — Z23 Encounter for immunization: Secondary | ICD-10-CM | POA: Insufficient documentation

## 2019-06-08 DIAGNOSIS — N920 Excessive and frequent menstruation with regular cycle: Secondary | ICD-10-CM | POA: Diagnosis not present

## 2019-06-08 DIAGNOSIS — D500 Iron deficiency anemia secondary to blood loss (chronic): Secondary | ICD-10-CM | POA: Insufficient documentation

## 2019-06-08 MED ORDER — DIPHENHYDRAMINE HCL 50 MG/ML IJ SOLN
12.5000 mg | Freq: Once | INTRAMUSCULAR | Status: AC
  Administered 2019-06-08: 12.5 mg via INTRAVENOUS

## 2019-06-08 MED ORDER — SODIUM CHLORIDE 0.9 % IV SOLN
510.0000 mg | Freq: Once | INTRAVENOUS | Status: AC
  Administered 2019-06-08: 510 mg via INTRAVENOUS
  Filled 2019-06-08: qty 510

## 2019-06-08 MED ORDER — DIPHENHYDRAMINE HCL 50 MG/ML IJ SOLN
INTRAMUSCULAR | Status: AC
  Filled 2019-06-08: qty 1

## 2019-06-08 MED ORDER — SODIUM CHLORIDE 0.9 % IV SOLN
Freq: Once | INTRAVENOUS | Status: AC
  Administered 2019-06-08: 10:00:00 via INTRAVENOUS
  Filled 2019-06-08: qty 250

## 2019-06-08 MED ORDER — FAMOTIDINE IN NACL 20-0.9 MG/50ML-% IV SOLN
20.0000 mg | Freq: Two times a day (BID) | INTRAVENOUS | Status: DC
  Administered 2019-06-08: 20 mg via INTRAVENOUS

## 2019-06-08 MED ORDER — INFLUENZA VAC SPLIT QUAD 0.5 ML IM SUSY
PREFILLED_SYRINGE | INTRAMUSCULAR | Status: AC
  Filled 2019-06-08: qty 0.5

## 2019-06-08 MED ORDER — INFLUENZA VAC SPLIT QUAD 0.5 ML IM SUSY
0.5000 mL | PREFILLED_SYRINGE | Freq: Once | INTRAMUSCULAR | Status: AC
  Administered 2019-06-08: 0.5 mL via INTRAMUSCULAR

## 2019-06-08 MED ORDER — FAMOTIDINE IN NACL 20-0.9 MG/50ML-% IV SOLN
INTRAVENOUS | Status: AC
  Filled 2019-06-08: qty 50

## 2019-06-08 NOTE — Patient Instructions (Signed)

## 2019-06-09 ENCOUNTER — Encounter (HOSPITAL_COMMUNITY): Payer: Self-pay | Admitting: Psychiatry

## 2019-06-10 ENCOUNTER — Ambulatory Visit (HOSPITAL_COMMUNITY): Payer: Medicare Other | Attending: Cardiology

## 2019-06-10 ENCOUNTER — Other Ambulatory Visit: Payer: Self-pay

## 2019-06-10 DIAGNOSIS — R010 Benign and innocent cardiac murmurs: Secondary | ICD-10-CM | POA: Diagnosis not present

## 2019-06-10 DIAGNOSIS — I272 Pulmonary hypertension, unspecified: Secondary | ICD-10-CM

## 2019-06-25 ENCOUNTER — Encounter: Payer: Self-pay | Admitting: Nurse Practitioner

## 2019-07-02 ENCOUNTER — Inpatient Hospital Stay: Payer: Medicare Other

## 2019-07-06 ENCOUNTER — Other Ambulatory Visit: Payer: Self-pay

## 2019-07-06 ENCOUNTER — Inpatient Hospital Stay: Payer: Medicare Other | Attending: Hematology

## 2019-07-06 DIAGNOSIS — Z79899 Other long term (current) drug therapy: Secondary | ICD-10-CM | POA: Diagnosis not present

## 2019-07-06 DIAGNOSIS — I10 Essential (primary) hypertension: Secondary | ICD-10-CM | POA: Insufficient documentation

## 2019-07-06 DIAGNOSIS — N920 Excessive and frequent menstruation with regular cycle: Secondary | ICD-10-CM | POA: Diagnosis not present

## 2019-07-06 DIAGNOSIS — D500 Iron deficiency anemia secondary to blood loss (chronic): Secondary | ICD-10-CM

## 2019-07-06 LAB — CBC WITH DIFFERENTIAL (CANCER CENTER ONLY)
Abs Immature Granulocytes: 0.07 10*3/uL (ref 0.00–0.07)
Basophils Absolute: 0 10*3/uL (ref 0.0–0.1)
Basophils Relative: 0 %
Eosinophils Absolute: 0.1 10*3/uL (ref 0.0–0.5)
Eosinophils Relative: 2 %
HCT: 39.6 % (ref 36.0–46.0)
Hemoglobin: 12.4 g/dL (ref 12.0–15.0)
Immature Granulocytes: 1 %
Lymphocytes Relative: 26 %
Lymphs Abs: 1.9 10*3/uL (ref 0.7–4.0)
MCH: 30.1 pg (ref 26.0–34.0)
MCHC: 31.3 g/dL (ref 30.0–36.0)
MCV: 96.1 fL (ref 80.0–100.0)
Monocytes Absolute: 0.6 10*3/uL (ref 0.1–1.0)
Monocytes Relative: 9 %
Neutro Abs: 4.7 10*3/uL (ref 1.7–7.7)
Neutrophils Relative %: 62 %
Platelet Count: 261 10*3/uL (ref 150–400)
RBC: 4.12 MIL/uL (ref 3.87–5.11)
RDW: 16.3 % — ABNORMAL HIGH (ref 11.5–15.5)
WBC Count: 7.4 10*3/uL (ref 4.0–10.5)
nRBC: 0 % (ref 0.0–0.2)

## 2019-07-06 LAB — IRON AND TIBC
Iron: 64 ug/dL (ref 41–142)
Saturation Ratios: 21 % (ref 21–57)
TIBC: 300 ug/dL (ref 236–444)
UIBC: 236 ug/dL (ref 120–384)

## 2019-07-06 LAB — FERRITIN: Ferritin: 62 ng/mL (ref 11–307)

## 2019-07-07 ENCOUNTER — Telehealth (INDEPENDENT_AMBULATORY_CARE_PROVIDER_SITE_OTHER): Payer: Medicare Other | Admitting: Cardiology

## 2019-07-07 ENCOUNTER — Telehealth: Payer: Self-pay | Admitting: *Deleted

## 2019-07-07 ENCOUNTER — Encounter: Payer: Self-pay | Admitting: Cardiology

## 2019-07-07 VITALS — HR 76 | Ht 65.0 in

## 2019-07-07 DIAGNOSIS — I10 Essential (primary) hypertension: Secondary | ICD-10-CM | POA: Diagnosis not present

## 2019-07-07 DIAGNOSIS — R010 Benign and innocent cardiac murmurs: Secondary | ICD-10-CM

## 2019-07-07 DIAGNOSIS — Z6841 Body Mass Index (BMI) 40.0 and over, adult: Secondary | ICD-10-CM

## 2019-07-07 DIAGNOSIS — R000 Tachycardia, unspecified: Secondary | ICD-10-CM

## 2019-07-07 DIAGNOSIS — E662 Morbid (severe) obesity with alveolar hypoventilation: Secondary | ICD-10-CM

## 2019-07-07 DIAGNOSIS — I272 Pulmonary hypertension, unspecified: Secondary | ICD-10-CM | POA: Diagnosis not present

## 2019-07-07 DIAGNOSIS — G4733 Obstructive sleep apnea (adult) (pediatric): Secondary | ICD-10-CM

## 2019-07-07 NOTE — Telephone Encounter (Signed)
Left detail message in regards to after visit summary  Instructions .  AVS summary is sent via mychart.  No changes at present - patient does not have to take Olmesartan   any question may call back  F/u is on as needed basis.

## 2019-07-07 NOTE — Progress Notes (Signed)
Virtual Visit via Telephone Note   This visit type was conducted due to national recommendations for restrictions regarding the COVID-19 Pandemic (e.g. social distancing) in an effort to limit this patient's exposure and mitigate transmission in our community.  Due to her co-morbid illnesses, this patient is at least at moderate risk for complications without adequate follow up.  This format is felt to be most appropriate for this patient at this time.  The patient did not have access to video technology/had technical difficulties with video requiring transitioning to audio format only (telephone).  All issues noted in this document were discussed and addressed.  No physical exam could be performed with this format.  Please refer to the patient's chart for her  consent to telehealth for [facility].   Patient has given verbal permission to conduct this visit via virtual appointment and to bill insurance 07/07/2019 5:04 PM     Evaluation Performed:  Follow-up visit  Date:  07/07/2019   ID:  Christine [name], DOB 17-Nov-1975, MRN [id number]  Patient Location: Home Provider Location: Home  PCP:  Maude Leriche, PA-C  Cardiologist:  Glenetta Hew, MD  Electrophysiologist:  None   Chief Complaint:  No chief complaint on file.   History of Present Illness:    Christine [name] is a 43 y.o. morbidly obese female with PMH notable for hypertension, PCOS and suggested pulmonary hypertension (no documented sleep apnea)) who presents via audio/video conferencing for a telehealth visit today.  Christine [name] was last seen on October 9 for follow-up evaluation of exertional dyspnea.  We discussed the results of her coronary calcium score and CTA.  Ordered 2D echo.[name]  Hospitalizations:  . None   Prior CV studies:   The following studies were reviewed today: . Echo 06/10/2019: EF 65-70%.  Normal LV size and function.  Normal RV size and function.  Normal biatrial size.  No significant tricuspid  regurgitation, suggesting normal pulmonary pressures (but unable to assess due to poor TR signal).  Right atrial pressure estimated 8 mmHg. [name] Coronary CTA: Coronary calcium score 0.  No evidence of CAD.  PA dilated at 37 mm-suggest some pulmonary hypertension.  Inerval History   Christine [name] continues to note exertional dyspnea with just routine activity.  Breathing hard and simply doing chores around the house.  Usually if she does any particular activity or exercise for the minute or 2 she will stop to catch her breath.  Not associated with chest pain or pressure.  She does note that her heart rate goes up with activity or anxiety.  When her heart rate goes up she gets lightheaded and dizzy, but has not had any syncope or near syncope.  She sleeps on several pillows because she will get short of breath.  Potentially orthopnea versus simply restrictive lung disease from OHS.  Cardiovascular ROS: positive for - dyspnea on exertion, orthopnea and rapid heart rate negative for - chest pain, edema, irregular heartbeat, paroxysmal nocturnal dyspnea or No real resting dyspnea.  She still remains quite sedentary at baseline.  ROS:  Please see the history of present illness.    The patient does not have symptoms concerning for COVID-19 infection (fever, chills, cough, or new shortness of breath).  ROS  The patient is practicing social distancing.  Past Medical History:  Diagnosis Date  . Anemia    Presumably from menorrhagia/menomenorrhagia; has had both blood and iron transfusions  . Anxiety   . Back pain   . Bipolar 1 disorder ([place])  With depression  . Depression   . Essential hypertension   . Fibromyalgia   . Headache   . History of blood transfusion 08/2017   [facility]  . History of hiatal hernia   . Infertility, female   . Lactose intolerance   . Lower extremity edema   . Migraines   . Morbid obesity ([place])   . Osteoarthritis    Hands, ankles; HLA-B27 positive  . PCOS (polycystic  ovarian syndrome)   . Seasonal allergies    With recurrent allergic rhinitis  . Uterine leiomyoma   . Vitamin D deficiency    Past Surgical History:  Procedure Laterality Date  . CARDIAC CT ANGIOGRAM  03/2019    Ca Score 0. Normal Coronary origin R dominant. No evidence of CAD. ? Liver nodules - poorly visualized (consider MRI w & w/o Gad contrast)  . CERVICAL POLYPECTOMY  01/15/2018   Procedure: CERVICAL POLYPECTOMY;  Surgeon: Arvella Nigh, MD;  Location: [facility];  Service: Gynecology;;  . COLONOSCOPY  2008   Normal  . DIAGNOSTIC LAPAROSCOPY  08/1999   dermoid cyst, [facility]  . DILATION AND CURETTAGE OF UTERUS  05/2004   [facility]  . DILATION AND CURETTAGE OF UTERUS  04/2015  . HYSTEROSCOPY W/D&C  07/22/2011   Procedure: DILATATION AND CURETTAGE /HYSTEROSCOPY;  Surgeon: Cyril Mourning, MD;  Location: [facility];  Service: Gynecology;;  . HYSTEROSCOPY W/D&C N/A 01/15/2018   Procedure: DILATATION AND CURETTAGE [place] WITH MYOSURE;  Surgeon: Arvella Nigh, MD;  Location: [facility];  Service: Gynecology;  Laterality: N/A;  . 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 TRANSTHORACIC ECHOCARDIOGRAM  08/2017    EF 65 to 70% with vigorous wall motion.  Suggestion of high cardiac output (possibly related to anemia thyrotoxicosis, Pregnancy, sepsis etc.) -- was in setting of symptomatic anemia - Hgb 5.7     Current Meds  Medication Sig  . amLODipine (NORVASC) 10 MG tablet Take 1 tablet (10 mg total) by mouth daily.  . ARIPiprazole (ABILIFY) 15 MG tablet Take 1 tablet (15 mg total) by mouth daily.  . clonazePAM (KLONOPIN) 0.5 MG tablet Take 1 tablet (0.5 mg total) by mouth daily as needed for anxiety.  [name] (AIMOVIG) 140 MG/ML SOAJ Inject 140 mg into the skin every 30 (thirty) days.  [name] lamoTRIgine (LAMICTAL) 150 MG tablet Take 1 tablet (150 mg total) by mouth 2 (two) times daily. For mood  . lithium carbonate 300 MG capsule Take 2 capsule in AM and 3 capsule at bed time  . metFORMIN  (GLUCOPHAGE) 500 MG tablet Take 1 tablet (500 mg total) by mouth 2 (two) times daily with a meal.  . metoprolol succinate (TOPROL-XL) 100 MG 24 hr tablet Take 1 tablet (100 mg total) by mouth daily. Take with or immediately following a meal.  . Vitamin D, Ergocalciferol, (DRISDOL) 1.25 MG (50000 UT) CAPS capsule Take 1 capsule (50,000 Units total) by mouth every 7 (seven) days.     Allergies:   Penicillins   Social History   Tobacco Use  . Smoking status: Never Smoker  . Smokeless tobacco: Never Used  Substance Use Topics  . Alcohol use: Yes    Comment: occasional, maybe once a month or so  . Drug use: No     Family Hx: The patient's family history includes Anxiety disorder in her mother; Cancer in her father and mother; Cancer (age of onset: 62) in her brother; Depression in her mother; Diabetes in her father; Heart disease in  her father; High blood pressure in her brother, father, and mother; Hyperlipidemia in her father and mother; Hypertension in her father and mother; Kidney disease in her father; Obesity in her father; Sleep apnea in her father; Stroke in her father. There is no history of Migraines.   Labs/Other Tests and Data Reviewed:    EKG:  No ECG reviewed.  Recent Labs: 10/07/2018: TSH 1.260 02/16/2019: ALT 14; BUN 13; Creatinine 0.87; Potassium 3.7; Sodium 140 07/06/2019: Hemoglobin 12.4; Platelet Count 261   Recent Lipid Panel Lab Results  Component Value Date/Time   CHOL 183 10/07/2018 12:20 PM   TRIG 70 10/07/2018 12:20 PM   HDL 38 (L) 10/07/2018 12:20 PM   LDLCALC 131 (H) 10/07/2018 12:20 PM    Wt Readings from Last 3 Encounters:  05/31/19 (!) 341 lb 1.6 oz (154.7 kg)  05/14/19 (!) 342 lb (155.1 kg)  02/22/19 (!) 323 lb (146.5 kg)     Objective:    Vital Signs:  Pulse 76   Ht _0  (1.651 m)   BMI 56.76 kg/m   VITAL SIGNS:  reviewed no blood pressure available Pleasant female in no acute distress. A&O x 3.  normal Mood & Affect Non-labored  respirations  ASSESSMENT & PLAN:    Problem List Items Addressed This Visit    Sinus tachycardia - Primary (Chronic)    Heart rates seem to be much better off and in the 70s now.  He did go up with it with anxiety or stress, but well-controlled on current dose of Toprol.      HTN (hypertension) (Chronic)    Her blood pressures at home seem to be running in the 130s over 70s range with heart rates in the 70s.  She did not check it today.  Seems to have improved with increased dose of Toprol.  She never did get the prescription for all losartan filled at last visit.  At present with her current blood pressure we decide to continue with Toprol and amlodipine.  Consider low-dose diuretic if additional blood pressure control is required.      Class 3 obesity with alveolar hypoventilation, serious comorbidity, and body mass index (BMI) of 50.0 to 59.9 in adult [facility]) (Chronic)    This is most likely etiology for her dyspnea.  We discussed the importance of weight loss.  Echocardiogram did not give much of a suggestion of elevated pulmonary pressures but CT scan evaluation did suggest dilated pulmonary artery.  I suspect that she does have some restrictive lung disease from OHS.  Discussed importance of sleep apnea evaluation, weight loss.  I question the benefit of the PFT evaluation as it would likely demonstrate some restrictive lung disease from body habitus.      OSA (obstructive sleep apnea) (Chronic)    She is due to undergo assessment for CPAP initiation.  Diagnosed with OSA now.  I suspect that this is contributing somewhat to her dyspnea and hypertension.  I recommended that she proceeds with this as this is probably of vital importance for her long-term health.      Pulmonary hypertension, unspecified (HCC) (Chronic)    Point pressures not suggest to be all that elevated by echocardiogram.  RV size relatively normal.  Pulmonary artery was dilated on CT scans so that would argue there  is some component of pulmonary hypertension.  Suspect this is from obesity and obesity hypoventilation.  At present, would simply continue amlodipine.      Functional systolic murmur  No significant murmur on exam last visit.  Echocardiogram did not suggest any significant valvular lesions.         So far, cardiology evaluation has been not shown any reason to explain dyspnea.  At present, would recommend proceeding with sleep study evaluation.  Could consider PFTs to evaluate for possible OHS.  Ultimately, the best option for her is working toward weight loss.  She should not have any issues with surgeries.  I do not think any further cardiac evaluation is required.  Simply continue current dose of Toprol as her heart rate seems to be better controlled along with better blood pressure.  Would consider low-dose diuretic if additional blood pressure control was required.  ARB would be next.  She can follow-up with [place] on an as needed basis.   COVID-19 Education: The signs and symptoms of COVID-19 were discussed with the patient and how to seek care for testing (follow up with PCP or arrange E-visit).   The importance of social distancing was discussed today.  Time:   Today, I have spent 24 minutes with the patient with telehealth technology discussing the above problems.     Medication Adjustments/Labs and Tests Ordered: Current medicines are reviewed at length with the patient today.  Concerns regarding medicines are outlined above.   Patient Instructions   Medication Instructions:  No change  do not need to fill prescription for Olmesartan  *If you need a refill on your cardiac medications before your next appointment, please call your pharmacy*  Lab Work: None If you have labs (blood work) drawn today and your tests are completely normal, you will receive your results only by: [name] MyChart Message (if you have MyChart) OR . A paper copy in the mail If you have any lab  test that is abnormal or we need to change your treatment, we will call you to review the results.  Testing/Procedures: None  Follow-Up: At [facility], you and your health needs are our priority.  As part of our continuing mission to provide you with exceptional heart care, we have created designated Provider Care Teams.  These Care Teams include your primary Cardiologist (physician) and Advanced Practice Providers (APPs -  Physician Assistants and Nurse Practitioners) who all work together to provide you with the care you need, when you need it.  Your next appointment:   As needed   The format for your next appointment:   Either In Person or Virtual  Provider:   You may see Glenetta Hew, MD or one of the following Advanced Practice Providers on your designated Care Team:    Rosaria Ferries, PA-C  Jory Sims, DNP, ANP  Cadence Kathlen Mody, NP   Other Instructions  Continue to follow-up with the sleep study evaluation and getting on CPAP  Continue with weight loss activities including diet and exercise.    Signed, Glenetta Hew, MD  07/07/2019 5:04 PM    [place]

## 2019-07-07 NOTE — Assessment & Plan Note (Signed)
Her blood pressures at home seem to be running in the 130s over 70s range with heart rates in the 70s.  She did not check it today.  Seems to have improved with increased dose of Toprol.  She never did get the prescription for all losartan filled at last visit.  At present with her current blood pressure we decide to continue with Toprol and amlodipine.  Consider low-dose diuretic if additional blood pressure control is required.

## 2019-07-07 NOTE — Telephone Encounter (Signed)
-----   Message from Alla Feeling, NP sent at 07/06/2019  4:06 PM EST ----- Please let her know hg and iron are in normal range, no need for Feraheme now. Return next month for labs as scheduled.  Thanks, Regan Rakers

## 2019-07-07 NOTE — Assessment & Plan Note (Signed)
She is due to undergo assessment for CPAP initiation.  Diagnosed with OSA now.  I suspect that this is contributing somewhat to her dyspnea and hypertension.  I recommended that she proceeds with this as this is probably of vital importance for her long-term health.

## 2019-07-07 NOTE — Assessment & Plan Note (Signed)
No significant murmur on exam last visit.  Echocardiogram did not suggest any significant valvular lesions.

## 2019-07-07 NOTE — Assessment & Plan Note (Signed)
Point pressures not suggest to be all that elevated by echocardiogram.  RV size relatively normal.  Pulmonary artery was dilated on CT scans so that would argue there is some component of pulmonary hypertension.  Suspect this is from obesity and obesity hypoventilation.  At present, would simply continue amlodipine.

## 2019-07-07 NOTE — Telephone Encounter (Signed)
Per Cira Rue, NP, notified pt about normal range hgb and iron, and no need for Feraheme now. Return for next scheduled lab. Pt verbalized understanding.

## 2019-07-07 NOTE — Assessment & Plan Note (Signed)
Heart rates seem to be much better off and in the 70s now.  He did go up with it with anxiety or stress, but well-controlled on current dose of Toprol.

## 2019-07-07 NOTE — Assessment & Plan Note (Signed)
This is most likely etiology for her dyspnea.  We discussed the importance of weight loss.  Echocardiogram did not give much of a suggestion of elevated pulmonary pressures but CT scan evaluation did suggest dilated pulmonary artery.  I suspect that she does have some restrictive lung disease from OHS.  Discussed importance of sleep apnea evaluation, weight loss.  I question the benefit of the PFT evaluation as it would likely demonstrate some restrictive lung disease from body habitus.

## 2019-07-07 NOTE — Patient Instructions (Addendum)
  Medication Instructions:  No change  do not need to fill prescription for Olmesartan  *If you need a refill on your cardiac medications before your next appointment, please call your pharmacy*  Lab Work: None If you have labs (blood work) drawn today and your tests are completely normal, you will receive your results only by: [name] MyChart Message (if you have MyChart) OR . A paper copy in the mail If you have any lab test that is abnormal or we need to change your treatment, we will call you to review the results.  Testing/Procedures: None  Follow-Up: At [facility], you and your health needs are our priority.  As part of our continuing mission to provide you with exceptional heart care, we have created designated Provider Care Teams.  These Care Teams include your primary Cardiologist (physician) and Advanced Practice Providers (APPs -  Physician Assistants and Nurse Practitioners) who all work together to provide you with the care you need, when you need it.  Your next appointment:   As needed   The format for your next appointment:   Either In Person or Virtual  Provider:   You may see Glenetta Hew, MD or one of the following Advanced Practice Providers on your designated Care Team:    Rosaria Ferries, PA-C  Jory Sims, DNP, ANP  Cadence Kathlen Mody, NP   Other Instructions  Continue to follow-up with the sleep study evaluation and getting on CPAP  Continue with weight loss activities including diet and exercise.

## 2019-07-12 ENCOUNTER — Ambulatory Visit (HOSPITAL_COMMUNITY): Payer: BC Managed Care – PPO | Admitting: Psychiatry

## 2019-07-12 ENCOUNTER — Other Ambulatory Visit: Payer: Self-pay

## 2019-07-19 ENCOUNTER — Encounter (HOSPITAL_COMMUNITY): Payer: Self-pay | Admitting: Psychiatry

## 2019-07-19 ENCOUNTER — Ambulatory Visit (INDEPENDENT_AMBULATORY_CARE_PROVIDER_SITE_OTHER): Payer: Medicare Other | Admitting: Psychiatry

## 2019-07-19 ENCOUNTER — Other Ambulatory Visit: Payer: Self-pay

## 2019-07-19 DIAGNOSIS — F3162 Bipolar disorder, current episode mixed, moderate: Secondary | ICD-10-CM

## 2019-07-19 NOTE — Progress Notes (Signed)
Virtual Visit via Video Note  I connected with Christine [name] on 07/19/19 at  1:00 PM EST by a video enabled telemedicine application and verified that I am speaking with the correct person using two identifiers.  Location: Patient: Christine [name] Provider: Lise Auer, LCSW   I discussed the limitations of evaluation and management by telemedicine and the availability of in person appointments. The patient expressed understanding and agreed to proceed.  History of Present Illness: Bipolar 1 Disorder   Observations/Objective: Counselor met with Christine [name] for individual therapy via Webex. Counselor assessed MH symptoms and progress on treatment plan goals. Christine [name] presents with moderate depression and moderate anxiety. Christine [name] denied suicidal ideation or self-harm behaviors. Christine [name] shared that since our last session she attempted to employ the plan for more structure and routine in her day, however, she and her family have unexpectedly purchased a home and moved during this time, causing challenges with follow through of the plan. Counselor assessed daily functioning and processed thoughts and feelings about the move. Christine [name] noted mixed emotions, related to grief and loss, shame of mental illness, parental guilt and feelings of inadequacy, along with excitement and newness. Counselor utilized CBT interventions to address negative and distorted cognitions impacting Christine [name]'s self-identity, self-esteem and overall functioning. Counselor gave [name] CBT homework to do for further work in this area. Counselor to connect [place] with local support group to connect with others working to manage mental health and will send success stories of others living with bipolar 1 disorder. Christine [name] would additionally like to work on developing a new routine and schedule to become more productive.   Assessment and Plan: Counselor will continue to meet with patient to address treatment plan goals. Patient will continue to  follow recommendations of providers and implement skills learned in session.  Follow Up Instructions: Counselor will send information for next session via Webex.     I discussed the assessment and treatment plan with the patient. The patient was provided an opportunity to ask questions and all were answered. The patient agreed with the plan and demonstrated an understanding of the instructions.   The patient was advised to call back or seek an in-person evaluation if the symptoms worsen or if the condition fails to improve as anticipated.  I provided 55 minutes of non-face-to-face time during this encounter.   Lise Auer, LCSW

## 2019-07-21 ENCOUNTER — Ambulatory Visit (HOSPITAL_COMMUNITY): Payer: Medicare Other | Admitting: Psychiatry

## 2019-07-22 ENCOUNTER — Encounter (HOSPITAL_COMMUNITY): Payer: Self-pay | Admitting: Psychiatry

## 2019-07-22 ENCOUNTER — Ambulatory Visit (INDEPENDENT_AMBULATORY_CARE_PROVIDER_SITE_OTHER): Payer: Medicare Other | Admitting: Psychiatry

## 2019-07-22 ENCOUNTER — Other Ambulatory Visit: Payer: Self-pay

## 2019-07-22 DIAGNOSIS — F3162 Bipolar disorder, current episode mixed, moderate: Secondary | ICD-10-CM

## 2019-07-22 DIAGNOSIS — F411 Generalized anxiety disorder: Secondary | ICD-10-CM

## 2019-07-22 MED ORDER — LITHIUM CARBONATE 300 MG PO CAPS: ORAL_CAPSULE | ORAL | 2 refills | Status: DC

## 2019-07-22 MED ORDER — ARIPIPRAZOLE 15 MG PO TABS: 15.0000 mg | ORAL_TABLET | Freq: Every day | ORAL | 2 refills | Status: DC

## 2019-07-22 MED ORDER — LAMOTRIGINE 150 MG PO TABS: 150.0000 mg | ORAL_TABLET | Freq: Two times a day (BID) | ORAL | 2 refills | Status: DC

## 2019-07-22 MED ORDER — CLONAZEPAM 0.5 MG PO TABS: 0.5000 mg | ORAL_TABLET | Freq: Every day | ORAL | 0 refills | Status: DC | PRN

## 2019-07-22 NOTE — Progress Notes (Signed)
Virtual Visit via Telephone Note  I connected with Christine [name] on 07/22/19 at  8:20 AM EST by telephone and verified that I am speaking with the correct person using two identifiers.   I discussed the limitations, risks, security and privacy concerns of performing an evaluation and management service by telephone and the availability of in person appointments. I also discussed with the patient that there may be a patient responsible charge related to this service. The patient expressed understanding and agreed to proceed.   History of Present Illness: Patient was evaluated by phone session.  She admitted lately poorly compliant with medication because she was in a process of moving.  Patient told they got the opportunity to buy a house and they took the opportunity but moving was stressful.  She forgot to take medication especially Klonopin and there were some nights that she was very anxious, sad and could not sleep.  But now she is back on medication and things are going better.  She still have some time ups and downs but they are not as bad.  She denies any mania, psychosis, hallucination.  She lives with her husband who is very supportive and usually reminded her to take the medication.  She lives with her 2 children who are 75 and 57 years old.  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  Patient denies any tremors, shakes or any EPS.  She reported her appetite is okay and her weight is unchanged from the past.  Recently she had blood work for her anemia.  Her CBC is okay.  She forgot to tell her PCP to do the lithium level.  However she is scheduled again in a month and she will remind to get the lithium level.  Patient realized that she need to take the medication since it is helping.   Past Psychiatric History:Reviewed. H/O depression since age 57. Antidepressant mademanic. Saw Dr. [name] and tried Risperdal, Seroquel, Abilify, lithium, Prozac, Paxil, Zoloft, Lexapro, Celexa,  Effexor and Klonopin.H/O mania and impulsive behavior. We tried [name]. H/O 3 inpatient, PHP and IOP. No h/o suicidal attempt. Last inpatient in November 2017.  Recent Results (from the past 2160 hour(s))  Novel Coronavirus, NAA (Hosp order, Send-out to Ref Lab; TAT 18-24 hrs     Status: None   Collection Time: 04/23/19  2:11 PM   Specimen: Nasopharyngeal Swab; Respiratory  Result Value Ref Range   SARS-CoV-2, NAA NOT DETECTED NOT DETECTED    Comment: (NOTE) This nucleic acid amplification test was developed and its performance characteristics determined by [employer]. Nucleic acid amplification tests include PCR and TMA. This test has not been FDA cleared or approved. This test has been authorized by FDA under an Emergency Use Authorization (EUA). This test is only authorized for the duration of time the declaration that circumstances exist justifying the authorization of the emergency use of in vitro diagnostic tests for detection of SARS-CoV-2 virus and/or diagnosis of COVID-19 infection under section 564(b)(1) of the Act, 21 U.S.C. [id number]) (1), unless the authorization is terminated or revoked sooner. When diagnostic testing is negative, the possibility of a false negative result should be considered in the context of a patient's recent exposures and the presence of clinical signs and symptoms consistent with COVID-19. An individual without symptoms of COVID- 19 and who is not shedding SARS-CoV-2 vi rus would expect to have a negative (not detected) result in this assay. Performed At: [facility] [place], [place] [id number] Rush Farmer MD  [id number]    Coronavirus Source NASOPHARYNGEAL     Comment: Performed at [facility], [place] [address] [place], [place] [address]  Ferritin     Status: Abnormal   Collection Time: 05/31/19  1:27 PM  Result Value Ref Range   Ferritin 10 (L) 11 - 307 ng/mL    Comment: Performed at [facility], [place] [address] [place], [place] [address]  Iron and TIBC     Status: Abnormal   Collection Time: 05/31/19  1:27 PM  Result Value Ref Range   Iron 42 41 - 142 ug/dL   TIBC 360 236 - 444 ug/dL   Saturation Ratios 12 (L) 21 - 57 %   UIBC 319 120 - 384 ug/dL    Comment: Performed at [facility], [place] [address] [place], [place] [address]  CBC with Differential ([place] Only)     Status: None   Collection Time: 05/31/19  1:27 PM  Result Value Ref Range   WBC Count 8.5 4.0 - 10.5 K/uL   RBC 4.21 3.87 - 5.11 MIL/uL   Hemoglobin 12.4 12.0 - 15.0 g/dL   HCT 38.0 36.0 - 46.0 %   MCV 90.3 80.0 - 100.0 fL   MCH 29.5 26.0 - 34.0 pg   MCHC 32.6 30.0 - 36.0 g/dL   RDW 15.0 11.5 - 15.5 %   Platelet Count 320 150 - 400 K/uL   nRBC 0.0 0.0 - 0.2 %   Neutrophils Relative % 66 %   Neutro Abs 5.6 1.7 - 7.7 K/uL   Lymphocytes Relative 23 %   Lymphs Abs 1.9 0.7 - 4.0 K/uL   Monocytes Relative 9 %   Monocytes Absolute 0.7 0.1 - 1.0 K/uL   Eosinophils Relative 1 %   Eosinophils Absolute 0.1 0.0 - 0.5 K/uL   Basophils Relative 0 %   Basophils Absolute 0.0 0.0 - 0.1 K/uL   Immature Granulocytes 1 %   Abs Immature Granulocytes 0.06 0.00 - 0.07 K/uL    Comment: Performed at [facility], [place] [address], [place], [place] [address]  Ferritin     Status: None   Collection Time: 07/06/19  8:06 AM  Result Value Ref Range   Ferritin 62 11 - 307 ng/mL    Comment: Performed at [facility], [place] [address] [place], [place] [address]  Iron and TIBC     Status: None   Collection Time: 07/06/19  8:06 AM  Result Value Ref Range   Iron 64 41 - 142 ug/dL   TIBC 300 236 - 444 ug/dL   Saturation Ratios 21 21 - 57 %   UIBC 236 120 - 384 ug/dL    Comment: Performed at [facility], [place] [address] [place], [place] [address]  CBC with Differential ([place] Only)      Status: Abnormal   Collection Time: 07/06/19  8:07 AM  Result Value Ref Range   WBC Count 7.4 4.0 - 10.5 K/uL   RBC 4.12 3.87 - 5.11 MIL/uL   Hemoglobin 12.4 12.0 - 15.0 g/dL   HCT 39.6 36.0 - 46.0 %   MCV 96.1 80.0 - 100.0 fL   MCH 30.1 26.0 - 34.0 pg   MCHC 31.3 30.0 - 36.0 g/dL   RDW 16.3 (H) 11.5 - 15.5 %   Platelet Count 261 150 - 400 K/uL   nRBC 0.0 0.0 - 0.2 %  Neutrophils Relative % 62 %   Neutro Abs 4.7 1.7 - 7.7 K/uL   Lymphocytes Relative 26 %   Lymphs Abs 1.9 0.7 - 4.0 K/uL   Monocytes Relative 9 %   Monocytes Absolute 0.6 0.1 - 1.0 K/uL   Eosinophils Relative 2 %   Eosinophils Absolute 0.1 0.0 - 0.5 K/uL   Basophils Relative 0 %   Basophils Absolute 0.0 0.0 - 0.1 K/uL   Immature Granulocytes 1 %   Abs Immature Granulocytes 0.07 0.00 - 0.07 K/uL    Comment: Performed at [facility], [place] [address], [place], [place] [address]       Psychiatric Specialty Exam: Physical Exam  Review of Systems  There were no vitals taken for this visit.There is no height or weight on file to calculate BMI.  General Appearance: NA  Eye Contact:  NA  Speech:  Clear and Coherent and fast  Volume:  Normal  Mood:  Anxious and Dysphoric  Affect:  NA  Thought Process:  Descriptions of Associations: Intact  Orientation:  Full (Time, Place, and Person)  Thought Content:  Rumination  Suicidal Thoughts:  No  Homicidal Thoughts:  No  Memory:  Immediate;   Good Recent;   Good Remote;   Good  Judgement:  Fair  Insight:  Present  Psychomotor Activity:  NA  Concentration:  Concentration: Fair and Attention Span: Fair  Recall:  Good  Fund of Knowledge:  Good  Language:  Good  Akathisia:  No  Handed:  Right  AIMS (if indicated):     Assets:  Communication Skills Desire for Improvement Housing Social Support  ADL's:  Intact  Cognition:  WNL  Sleep:   fair      Assessment and Plan: Bipolar disorder type I.  Generalized anxiety  disorder.  Reinforced medication compliance and therapy appointments.  She admitted missing [name] but promised to reschedule appointment.  She also promised to take the medication every day since it helps.  She also told that she will get the lithium level next time when she had blood work for her CBC.  She does not want to change medication since it is working.  Continue Lamictal 300 mg daily, Abilify 50 mg at bedtime, Klonopin 0.5 mg as needed and lithium 600 mg in the morning and 900 mg at bedtime.  Recommended to call us back if she has any question or any concern.  Follow-up in 3 months.  Follow Up Instructions:    I discussed the assessment and treatment plan with the patient. The patient was provided an opportunity to ask questions and all were answered. The patient agreed with the plan and demonstrated an understanding of the instructions.   The patient was advised to call back or seek an in-person evaluation if the symptoms worsen or if the condition fails to improve as anticipated.  I provided 20 minutes of non-face-to-face time during this encounter.   Kathlee Nations, MD

## 2019-07-26 ENCOUNTER — Ambulatory Visit (INDEPENDENT_AMBULATORY_CARE_PROVIDER_SITE_OTHER): Payer: Medicare Other | Admitting: Psychiatry

## 2019-07-26 ENCOUNTER — Encounter (HOSPITAL_COMMUNITY): Payer: Self-pay | Admitting: Psychiatry

## 2019-07-26 ENCOUNTER — Other Ambulatory Visit: Payer: Self-pay

## 2019-07-26 DIAGNOSIS — F3162 Bipolar disorder, current episode mixed, moderate: Secondary | ICD-10-CM

## 2019-07-26 NOTE — Progress Notes (Signed)
Virtual Visit via Video Note  I connected with Christine [name] on 07/26/19 at  4:00 PM EST by a video enabled telemedicine application and verified that I am speaking with the correct person using two identifiers.  Location: Patient: Christine [name] Provider: Lise Auer, LCSW   I discussed the limitations of evaluation and management by telemedicine and the availability of in person appointments. The patient expressed understanding and agreed to proceed.  History of Present Illness: Bipolar 1 DO   Observations/Objective: Counselor met with Christine [name] for individual therapy via Webex. Counselor assessed MH symptoms and progress on treatment plan goals. Christine [name] presents with moderate depression and moderate anxiety. Christine [name] denied suicidal ideation or self-harm behaviors. Christine [name] shared that she is making progress in talking about her grief and loss experience with her brother and her husband as it relates to the passing of her mother over a year ago. Counselor and [name] discussed and identified challenges and barriers in her perception and acceptance of her mental health diagnosis and how it's impacted her life in various ways. Counselor provided CBT interventions to process thoughts and feelings. Christine [name] responded well to interventions. Christine [name] noted a desire to be more present in her children and husband's lives, discussing plans for more engagement and combating depressive symptoms. Counselor and [name] engaged in conversation about her challenges with taking mental health medications as prescribed. Christine [name] identified feelings of shame, embarrassment, and denial. Christine [name] to discuss these concerns more openly with her provider at the next appointment. Counselor shared psychoeducation on the importance of openness and honesty with providers to best meet her needs and understand her symptoms. [name] was understanding and has a desire to alter behaviors with medication administration.   Assessment and  Plan: Counselor will continue to meet with patient to address treatment plan goals. Patient will continue to follow recommendations of providers and implement skills learned in session.  Follow Up Instructions: Counselor will send information for next session via Webex.     I discussed the assessment and treatment plan with the patient. The patient was provided an opportunity to ask questions and all were answered. The patient agreed with the plan and demonstrated an understanding of the instructions.   The patient was advised to call back or seek an in-person evaluation if the symptoms worsen or if the condition fails to improve as anticipated.  I provided 55 minutes of non-face-to-face time during this encounter.   Lise Auer, LCSW

## 2019-08-02 ENCOUNTER — Other Ambulatory Visit: Payer: Medicare Other

## 2019-08-07 ENCOUNTER — Other Ambulatory Visit: Payer: Self-pay

## 2019-08-07 ENCOUNTER — Emergency Department (HOSPITAL_COMMUNITY): Payer: Medicare Other

## 2019-08-07 ENCOUNTER — Inpatient Hospital Stay (HOSPITAL_COMMUNITY)
Admission: EM | Admit: 2019-08-07 | Discharge: 2019-08-10 | DRG: 291 | Disposition: A | Discharge status: Home / Self Care | Payer: Medicare Other | Attending: Internal Medicine | Admitting: Internal Medicine

## 2019-08-07 ENCOUNTER — Encounter (HOSPITAL_COMMUNITY): Payer: Self-pay

## 2019-08-07 DIAGNOSIS — R0602 Shortness of breath: Secondary | ICD-10-CM | POA: Diagnosis not present

## 2019-08-07 DIAGNOSIS — E662 Morbid (severe) obesity with alveolar hypoventilation: Secondary | ICD-10-CM

## 2019-08-07 DIAGNOSIS — J810 Acute pulmonary edema: Secondary | ICD-10-CM | POA: Diagnosis not present

## 2019-08-07 DIAGNOSIS — Z833 Family history of diabetes mellitus: Secondary | ICD-10-CM | POA: Diagnosis not present

## 2019-08-07 DIAGNOSIS — E282 Polycystic ovarian syndrome: Secondary | ICD-10-CM | POA: Diagnosis present

## 2019-08-07 DIAGNOSIS — Z8349 Family history of other endocrine, nutritional and metabolic diseases: Secondary | ICD-10-CM

## 2019-08-07 DIAGNOSIS — M797 Fibromyalgia: Secondary | ICD-10-CM | POA: Diagnosis present

## 2019-08-07 DIAGNOSIS — I509 Heart failure, unspecified: Secondary | ICD-10-CM | POA: Diagnosis not present

## 2019-08-07 DIAGNOSIS — R000 Tachycardia, unspecified: Secondary | ICD-10-CM | POA: Diagnosis not present

## 2019-08-07 DIAGNOSIS — Z6841 Body Mass Index (BMI) 40.0 and over, adult: Secondary | ICD-10-CM

## 2019-08-07 DIAGNOSIS — J9601 Acute respiratory failure with hypoxia: Secondary | ICD-10-CM | POA: Diagnosis not present

## 2019-08-07 DIAGNOSIS — Z880 Allergy status to penicillin: Secondary | ICD-10-CM

## 2019-08-07 DIAGNOSIS — Z841 Family history of disorders of kidney and ureter: Secondary | ICD-10-CM

## 2019-08-07 DIAGNOSIS — Z79899 Other long term (current) drug therapy: Secondary | ICD-10-CM

## 2019-08-07 DIAGNOSIS — I493 Ventricular premature depolarization: Secondary | ICD-10-CM | POA: Diagnosis present

## 2019-08-07 DIAGNOSIS — I10 Essential (primary) hypertension: Secondary | ICD-10-CM | POA: Diagnosis not present

## 2019-08-07 DIAGNOSIS — Z8249 Family history of ischemic heart disease and other diseases of the circulatory system: Secondary | ICD-10-CM | POA: Diagnosis not present

## 2019-08-07 DIAGNOSIS — F319 Bipolar disorder, unspecified: Secondary | ICD-10-CM | POA: Diagnosis not present

## 2019-08-07 DIAGNOSIS — J811 Chronic pulmonary edema: Secondary | ICD-10-CM | POA: Diagnosis present

## 2019-08-07 DIAGNOSIS — L83 Acanthosis nigricans: Secondary | ICD-10-CM | POA: Diagnosis present

## 2019-08-07 DIAGNOSIS — Z90721 Acquired absence of ovaries, unilateral: Secondary | ICD-10-CM

## 2019-08-07 DIAGNOSIS — M19272 Secondary osteoarthritis, left ankle and foot: Secondary | ICD-10-CM | POA: Diagnosis present

## 2019-08-07 DIAGNOSIS — I5031 Acute diastolic (congestive) heart failure: Secondary | ICD-10-CM | POA: Diagnosis not present

## 2019-08-07 DIAGNOSIS — M19042 Primary osteoarthritis, left hand: Secondary | ICD-10-CM | POA: Diagnosis present

## 2019-08-07 DIAGNOSIS — G4733 Obstructive sleep apnea (adult) (pediatric): Secondary | ICD-10-CM | POA: Diagnosis not present

## 2019-08-07 DIAGNOSIS — M19041 Primary osteoarthritis, right hand: Secondary | ICD-10-CM | POA: Diagnosis present

## 2019-08-07 DIAGNOSIS — F3162 Bipolar disorder, current episode mixed, moderate: Secondary | ICD-10-CM

## 2019-08-07 DIAGNOSIS — I110 Hypertensive heart disease with heart failure: Secondary | ICD-10-CM | POA: Diagnosis not present

## 2019-08-07 DIAGNOSIS — Z7984 Long term (current) use of oral hypoglycemic drugs: Secondary | ICD-10-CM

## 2019-08-07 DIAGNOSIS — Z823 Family history of stroke: Secondary | ICD-10-CM | POA: Diagnosis not present

## 2019-08-07 DIAGNOSIS — Z818 Family history of other mental and behavioral disorders: Secondary | ICD-10-CM

## 2019-08-07 DIAGNOSIS — M19271 Secondary osteoarthritis, right ankle and foot: Secondary | ICD-10-CM | POA: Diagnosis present

## 2019-08-07 DIAGNOSIS — Z20822 Contact with and (suspected) exposure to covid-19: Secondary | ICD-10-CM | POA: Diagnosis not present

## 2019-08-07 DIAGNOSIS — R05 Cough: Secondary | ICD-10-CM | POA: Diagnosis not present

## 2019-08-07 DIAGNOSIS — Z806 Family history of leukemia: Secondary | ICD-10-CM | POA: Diagnosis not present

## 2019-08-07 LAB — BASIC METABOLIC PANEL
Anion gap: 10 (ref 5–15)
BUN: 10 mg/dL (ref 6–20)
CO2: 25 mmol/L (ref 22–32)
Calcium: 9.4 mg/dL (ref 8.9–10.3)
Chloride: 105 mmol/L (ref 98–111)
Creatinine, Ser: 0.78 mg/dL (ref 0.44–1.00)
GFR calc Af Amer: >60 mL/min (ref >60)
GFR calc non Af Amer: >60 mL/min (ref >60)
Glucose, Bld: 96 mg/dL (ref 70–99)
Potassium: 3.7 mmol/L (ref 3.5–5.1)
Sodium: 140 mmol/L (ref 135–145)

## 2019-08-07 LAB — HEPATIC FUNCTION PANEL
ALT: 26 U/L (ref 0–44)
AST: 16 U/L (ref 15–41)
Albumin: 4.2 g/dL (ref 3.5–5.0)
Alkaline Phosphatase: 53 U/L (ref 38–126)
Bilirubin, Direct: <0.1 mg/dL (ref 0.0–0.2)
Total Bilirubin: 0.6 mg/dL (ref 0.3–1.2)
Total Protein: 7.5 g/dL (ref 6.5–8.1)

## 2019-08-07 LAB — CBC WITH DIFFERENTIAL/PLATELET
Abs Immature Granulocytes: 0.19 10*3/uL — ABNORMAL HIGH (ref 0.00–0.07)
Basophils Absolute: 0 10*3/uL (ref 0.0–0.1)
Basophils Relative: 0 %
Eosinophils Absolute: 0.1 10*3/uL (ref 0.0–0.5)
Eosinophils Relative: 1 %
HCT: 37.7 % (ref 36.0–46.0)
Hemoglobin: 11.6 g/dL — ABNORMAL LOW (ref 12.0–15.0)
Immature Granulocytes: 2 %
Lymphocytes Relative: 16 %
Lymphs Abs: 1.5 10*3/uL (ref 0.7–4.0)
MCH: 30 pg (ref 26.0–34.0)
MCHC: 30.8 g/dL (ref 30.0–36.0)
MCV: 97.4 fL (ref 80.0–100.0)
Monocytes Absolute: 0.7 10*3/uL (ref 0.1–1.0)
Monocytes Relative: 8 %
Neutro Abs: 6.7 10*3/uL (ref 1.7–7.7)
Neutrophils Relative %: 73 %
Platelets: 285 10*3/uL (ref 150–400)
RBC: 3.87 MIL/uL (ref 3.87–5.11)
RDW: 15.3 % (ref 11.5–15.5)
WBC: 9.3 10*3/uL (ref 4.0–10.5)
nRBC: 0.2 % (ref 0.0–0.2)

## 2019-08-07 LAB — I-STAT BETA HCG BLOOD, ED (MC, WL, AP ONLY): I-stat hCG, quantitative: <5 m[IU]/mL (ref <5)

## 2019-08-07 LAB — TROPONIN I (HIGH SENSITIVITY)
Troponin I (High Sensitivity): 2 ng/L (ref <18)
Troponin I (High Sensitivity): 2 ng/L (ref <18)

## 2019-08-07 LAB — D-DIMER, QUANTITATIVE: D-Dimer, Quant: 0.5 ug/mL-FEU (ref 0.00–0.50)

## 2019-08-07 LAB — POC SARS CORONAVIRUS 2 AG -  ED: SARS Coronavirus 2 Ag: NEGATIVE

## 2019-08-07 LAB — BRAIN NATRIURETIC PEPTIDE: B Natriuretic Peptide: 16.1 pg/mL (ref 0.0–100.0)

## 2019-08-07 MED ORDER — CLONAZEPAM 0.5 MG PO TABS
0.5000 mg | ORAL_TABLET | Freq: Every day | ORAL | Status: DC | PRN
  Administered 2019-08-09: 0.5 mg via ORAL
  Filled 2019-08-07: qty 1

## 2019-08-07 MED ORDER — SODIUM CHLORIDE (PF) 0.9 % IJ SOLN
INTRAMUSCULAR | Status: AC
  Filled 2019-08-07: qty 50

## 2019-08-07 MED ORDER — METOPROLOL SUCCINATE ER 50 MG PO TB24
100.0000 mg | ORAL_TABLET | Freq: Every day | ORAL | Status: DC
  Administered 2019-08-08 – 2019-08-10 (×3): 100 mg via ORAL
  Filled 2019-08-07 (×3): qty 2

## 2019-08-07 MED ORDER — IOHEXOL 350 MG/ML SOLN
100.0000 mL | Freq: Once | INTRAVENOUS | Status: AC | PRN
  Administered 2019-08-07: 100 mL via INTRAVENOUS

## 2019-08-07 MED ORDER — FUROSEMIDE 10 MG/ML IJ SOLN
20.0000 mg | Freq: Once | INTRAMUSCULAR | Status: AC
  Administered 2019-08-07: 20 mg via INTRAVENOUS
  Filled 2019-08-07: qty 4

## 2019-08-07 MED ORDER — LAMOTRIGINE 25 MG PO TABS
150.0000 mg | ORAL_TABLET | Freq: Two times a day (BID) | ORAL | Status: DC
  Administered 2019-08-08 – 2019-08-10 (×6): 150 mg via ORAL
  Filled 2019-08-07: qty 1
  Filled 2019-08-07: qty 2
  Filled 2019-08-07: qty 1
  Filled 2019-08-07: qty 2
  Filled 2019-08-07 (×2): qty 1

## 2019-08-07 MED ORDER — ACETAMINOPHEN 325 MG PO TABS
650.0000 mg | ORAL_TABLET | Freq: Once | ORAL | Status: AC
  Administered 2019-08-07: 650 mg via ORAL
  Filled 2019-08-07: qty 2

## 2019-08-07 MED ORDER — ARIPIPRAZOLE 5 MG PO TABS
15.0000 mg | ORAL_TABLET | Freq: Every day | ORAL | Status: DC
  Administered 2019-08-08 – 2019-08-10 (×3): 15 mg via ORAL
  Filled 2019-08-07 (×3): qty 1

## 2019-08-07 MED ORDER — FUROSEMIDE 10 MG/ML IJ SOLN
20.0000 mg | Freq: Every day | INTRAMUSCULAR | Status: DC
  Administered 2019-08-08 – 2019-08-10 (×3): 20 mg via INTRAVENOUS
  Filled 2019-08-07 (×3): qty 2

## 2019-08-07 MED ORDER — ENOXAPARIN SODIUM 60 MG/0.6ML ~~LOC~~ SOLN
60.0000 mg | Freq: Every day | SUBCUTANEOUS | Status: DC
  Administered 2019-08-08 (×2): 60 mg via SUBCUTANEOUS
  Filled 2019-08-07 (×2): qty 0.6

## 2019-08-07 MED ORDER — ACETAMINOPHEN 325 MG PO TABS
650.0000 mg | ORAL_TABLET | Freq: Four times a day (QID) | ORAL | Status: DC | PRN
  Administered 2019-08-09: 650 mg via ORAL
  Filled 2019-08-07: qty 2

## 2019-08-07 MED ORDER — AMLODIPINE BESYLATE 10 MG PO TABS
10.0000 mg | ORAL_TABLET | Freq: Every day | ORAL | Status: DC
  Administered 2019-08-08 – 2019-08-09 (×2): 10 mg via ORAL
  Filled 2019-08-07 (×2): qty 1

## 2019-08-07 MED ORDER — LITHIUM CARBONATE 300 MG PO CAPS
900.0000 mg | ORAL_CAPSULE | Freq: Every day | ORAL | Status: DC
  Administered 2019-08-08 – 2019-08-09 (×3): 900 mg via ORAL
  Filled 2019-08-07 (×4): qty 3

## 2019-08-07 NOTE — ED Triage Notes (Signed)
Pt presents with c/o cough, shortness of breath, and wheezing. Pt reports that she has been experiencing these symptoms since Wednesday, denies any Covid exposures.

## 2019-08-07 NOTE — ED Provider Notes (Signed)
I assumed care of patient at shift change from previous team, please see their note for full H and P.   Briefly patient is here for nonproductive cough x3 days with chest pain and shortness of breath.  Physical Exam  BP 139/79   Pulse 79   Temp 98.7 F (37.1 C) (Oral)   Resp 15   Ht 5\' 4"  (1.626 m)   Wt (!) 152 kg   LMP 07/29/2019 (Approximate)   SpO2 94%   BMI 57.50 kg/m   Physical Exam Vitals and nursing note reviewed.  Constitutional:      General: She is not in acute distress.    Appearance: She is well-developed. She is not diaphoretic.  HENT:     Head: Normocephalic and atraumatic.  Eyes:     General: No scleral icterus.       Right eye: No discharge.        Left eye: No discharge.     Conjunctiva/sclera: Conjunctivae normal.  Cardiovascular:     Rate and Rhythm: Normal rate and regular rhythm.  Pulmonary:     Effort: Tachypnea and accessory muscle usage (mild) present. No respiratory distress.     Breath sounds: No stridor.  Chest:     Chest wall: No tenderness.  Abdominal:     General: There is no distension.  Musculoskeletal:        General: No deformity.     Cervical back: Normal range of motion.     Comments: Unable to clearly assess for edema due to body habitus.   Skin:    General: Skin is warm and dry.  Neurological:     General: No focal deficit present.     Mental Status: She is alert.     Motor: No abnormal muscle tone.  Psychiatric:        Behavior: Behavior normal.      ED Course/Procedures     Procedures  MDM   CT Angio Chest PE W/Cm &/Or Wo Cm  Result Date: 08/07/2019 CLINICAL DATA:  Shortness of breath EXAM: CT ANGIOGRAPHY CHEST WITH CONTRAST TECHNIQUE: Multidetector CT imaging of the chest was performed using the standard protocol during bolus administration of intravenous contrast. Multiplanar CT image reconstructions and MIPs were obtained to evaluate the vascular anatomy. CONTRAST:  116mL OMNIPAQUE IOHEXOL 350 MG/ML SOLN  COMPARISON:  01/30/2019 FINDINGS: Cardiovascular: Examination is limited secondary to suboptimal contrast bolus timing, respiratory motion, and beam hardening artifact related to patient body habitus. There is no evidence of central, main, or lobar branch filling defect to suggest pulmonary embolism. The segmental and subsegmental branch arteries are suboptimally evaluated. Prominence of the pulmonary vasculature suggesting venous congestion. Heart size is normal. Thoracic aorta is nonaneurysmal. Mediastinum/Nodes: No enlarged mediastinal, hilar, or axillary lymph nodes. Thyroid gland, trachea, and esophagus demonstrate no significant findings. Lungs/Pleura: No focal airspace consolidation, pleural effusion, or pneumothorax. Upper Abdomen: No acute abnormality. Musculoskeletal: No chest wall abnormality. No acute or significant osseous findings. Review of the MIP images confirms the above findings. IMPRESSION: 1. Slightly limited exam. There is no evidence of central, main, or lobar branch filling defect to suggest pulmonary embolism. 2. Findings suggesting pulmonary vascular congestion. No airspace consolidation or overt pulmonary edema. Electronically Signed   By: Davina Poke D.O.   On: 08/07/2019 17:52   DG Chest Portable 1 View  Result Date: 08/07/2019 CLINICAL DATA:  Pt presents with c/o cough, shortness of breath, and wheezing since Wednesday. Medical hx of HTN. EXAM: PORTABLE CHEST 1  VIEW COMPARISON:  Chest radiograph 01/30/2019 FINDINGS: Stable cardiomediastinal contours with enlarged heart size. Central vascular congestion without overt edema. No new focal infiltrate. No pneumothorax or large pleural effusion. No acute finding in the visualized skeleton. IMPRESSION: Cardiomegaly with central vascular congestion, no overt edema or active infection. Electronically Signed   By: Audie Pinto M.D.   On: 08/07/2019 13:01   Labs Reviewed  CBC WITH DIFFERENTIAL/PLATELET - Abnormal; Notable for the  following components:      Result Value   Hemoglobin 11.6 (*)    Abs Immature Granulocytes 0.19 (*)    All other components within normal limits  SARS CORONAVIRUS 2 (TAT 6-24 HRS)  BASIC METABOLIC PANEL  D-DIMER, QUANTITATIVE (NOT AT [facility])  HEPATIC FUNCTION PANEL  BRAIN NATRIURETIC PEPTIDE  HIV ANTIBODY (ROUTINE TESTING W REFLEX)  TSH  BASIC METABOLIC PANEL  CBC  LITHIUM LEVEL  URINALYSIS, ROUTINE W REFLEX MICROSCOPIC  POC SARS CORONAVIRUS 2 AG -  ED  I-STAT BETA HCG BLOOD, ED ([facility], [facility], [facility] ONLY)  TROPONIN I (HIGH SENSITIVITY)  TROPONIN I (HIGH SENSITIVITY)     Plan is to follow-up on delta troponin, obtain BMP and hepatic function panel.  Chest x-ray shows concern for pulmonary edema.  Patient does not have a history of pulmonary edema.  Will reevaluate after labs and delta troponin.  Delta troponin is not significantly elevated.  CBC is significant for elevated absolute immature granulocytes.  BMP and hepatic function panel are normal.  D-dimer is not significantly elevated however do not have clear cause to explain patient's symptoms at this time.  CT a PE study was obtained with out evidence of PE.  BNP is not significantly elevated, however clinically suspect fluid overload especially in the setting of chest x-ray concerning for pulmonary edema.  She is given a dose of Lasix in the emergency room.  Patient had an echo within the past few months that showed a normal EF, however given that this appears to be a significant change concern for a possible event in the interim.  I spoke with Dr. Flossie Buffy who agreed to admit the patient.    Note: Portions of this report may have been transcribed using voice recognition software. Every effort was made to ensure accuracy; however, inadvertent computerized transcription errors may be present        Ollen Gross 08/07/19 [id number]    Quintella Reichert, MD 08/08/19 1511

## 2019-08-07 NOTE — ED Provider Notes (Signed)
[facility] DEPT Provider Note   CSN: [id number] Arrival date & time: 08/07/19  1204     History Chief Complaint  Patient presents with  . Cough  . Shortness of Breath    [name] Lose is a 44 y.o. female past medical history significant for bipolar 1 disorder, PCOS, fibromyalgia, hypertension presents to emergency department today with chief complaint of nonproductive cough x3 days.  She is also endorsing shortness of breath and chest pain.  She states the chest pain started x2 days ago while walking around.  Chest pain is intermittent.  She describes it as a pressure sensation in the middle of her chest.  It does not radiate.  Chest pressure is worse with exertion.  She rates the pain 5/10 in severity. She has noticed swelling in her bilateral ankles x 2 weeks. She states swelling is intermittent and gets better with elevation.    She reports father has CHF, but no family history of MI.  Patient saw cardiologist Dr. Ellyn Hack on 07/07/2019 for telemedicine visit as a follow-up for evaluation of exertional dyspnea on 05/14/2019.  She had an echo on 06/10/2019 with impression: LVEF 65 to 70% and normal LV size and function.  Normal RV size and function.  Normal biatrial size.  No significant tricuspid regurgitation, suggesting normal pulmonary pressures (but unable to assess due to poor TR signal).  Right atrial pressure estimated 8 mmHg.  She denies fever, chills, congestion, diaphoresis, radiation of pain to right/left arm, abdominal pain, urinary symptoms, diarrhea. History provided by patient with additional history obtained from chart review.     Past Medical History:  Diagnosis Date  . Anemia    Presumably from menorrhagia/menomenorrhagia; has had both blood and iron transfusions  . Anxiety   . Back pain   . Bipolar 1 disorder ([place])    With depression  . Depression   . Essential hypertension   . Fibromyalgia   . Headache   . History of blood  transfusion 08/2017   [facility]  . History of hiatal hernia   . Infertility, female   . Lactose intolerance   . Lower extremity edema   . Migraines   . Morbid obesity ([place])   . Osteoarthritis    Hands, ankles; HLA-B27 positive  . PCOS (polycystic ovarian syndrome)   . Seasonal allergies    With recurrent allergic rhinitis  . Uterine leiomyoma   . Vitamin D deficiency     Patient Active Problem List   Diagnosis Date Noted  . Sinus tachycardia 05/17/2019  . Atypical chest pain 02/11/2019  . Pulmonary hypertension, unspecified ([place]) 02/11/2019  . Functional systolic murmur 82/50/0370  . Inadequate sleep hygiene 12/08/2018  . Psychophysiological insomnia 12/08/2018  . Class 3 obesity with alveolar hypoventilation, serious comorbidity, and body mass index (BMI) of 50.0 to 59.9 in adult ([place]) 12/08/2018  . OSA (obstructive sleep apnea) 12/08/2018  . Chronic migraine without aura without status migrainosus, not intractable 09/21/2018  . Bipolar 1 disorder, depressed, severe ([place]) 09/08/2018  . Endometrium, polyp 01/15/2018    Class: Present on Admission  . Fibroids, submucosal 01/15/2018    Class: Present on Admission  . Iron deficiency anemia due to chronic blood loss 09/16/2017  . Menorrhagia with regular cycle   . Symptomatic anemia 08/28/2017  . Acute blood loss anemia 08/28/2017  . Bipolar disorder ([place]) 08/28/2017  . Anxiety 08/28/2017  . Depression with anxiety 08/28/2017  . Fibroids 08/28/2017  . PCOS (polycystic ovarian syndrome) 08/28/2017  .  Fever 08/28/2017  . HTN (hypertension) 08/28/2017  . Morbid obesity ([place]) 12/04/2015  . Pregnancy with history of uterine myomectomy 04/07/2015  . History of right oophorectomy 02/21/2015  . History of recurrent miscarriages 02/01/2015  . Vitamin D deficiency 02/01/2015    Past Surgical History:  Procedure Laterality Date  . CARDIAC CT ANGIOGRAM  03/2019    Ca Score 0. Normal Coronary origin R dominant. No evidence of CAD. ?  Liver nodules - poorly visualized (consider MRI w & w/o Gad contrast)  . CERVICAL POLYPECTOMY  01/15/2018   Procedure: CERVICAL POLYPECTOMY;  Surgeon: Arvella Nigh, MD;  Location: [facility];  Service: Gynecology;;  . COLONOSCOPY  2008   Normal  . DIAGNOSTIC LAPAROSCOPY  08/1999   dermoid cyst, [facility]  . DILATION AND CURETTAGE OF UTERUS  05/2004   [facility]  . DILATION AND CURETTAGE OF UTERUS  04/2015  . HYSTEROSCOPY WITH D & C  07/22/2011   Procedure: DILATATION AND CURETTAGE /HYSTEROSCOPY;  Surgeon: Cyril Mourning, MD;  Location: [facility];  Service: Gynecology;;  . HYSTEROSCOPY WITH D & C N/A 01/15/2018   Procedure: DILATATION AND CURETTAGE [place] WITH MYOSURE;  Surgeon: Arvella Nigh, MD;  Location: [facility];  Service: Gynecology;  Laterality: N/A;  . 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 TRANSTHORACIC ECHOCARDIOGRAM  08/2017    EF 65 to 70% with vigorous wall motion.  Suggestion of high cardiac output (possibly related to anemia thyrotoxicosis, Pregnancy, sepsis etc.) -- was in setting of symptomatic anemia - Hgb 5.7     OB History    Gravida  4   Para      Term      Preterm      AB  4   Living        SAB  4   TAB      Ectopic      Multiple      Live Births              Family History  Problem Relation Age of Onset  . Anxiety disorder Mother   . Depression Mother   . Cancer Mother        pancreatic   . High blood pressure Mother   . Hypertension Mother   . Hyperlipidemia Mother   . Cancer Father        colon  . High blood pressure Father   . Diabetes Father   . Hypertension Father   . Hyperlipidemia Father   . Heart disease Father   . Stroke Father   . Kidney disease Father   . Sleep apnea Father   . Obesity Father   . Cancer Brother 6       leukemia, childhood  . High blood pressure Brother   . Migraines Neg Hx     Social History   Tobacco Use  . Smoking status: Never Smoker  . Smokeless tobacco: Never Used  Substance Use Topics  .  Alcohol use: Yes    Comment: occasional, maybe once a month or so  . Drug use: No    Home Medications Prior to Admission medications   Medication Sig Start Date End Date Taking? Authorizing Provider  amLODipine (NORVASC) 10 MG tablet Take 1 tablet (10 mg total) by mouth daily. 08/29/17   Eugenie Filler, MD  ARIPiprazole (ABILIFY) 15 MG tablet Take 1 tablet (15 mg total) by mouth daily. 07/22/19 07/21/20  [name], [name], MD  clonazePAM (KLONOPIN) 0.5 MG  tablet Take 1 tablet (0.5 mg total) by mouth daily as needed for anxiety. 07/22/19   [name], [name], MD  Erenumab-aooe (AIMOVIG) 140 MG/ML SOAJ Inject 140 mg into the skin every 30 (thirty) days. 09/21/18   Melvenia Beam, MD  lamoTRIgine (LAMICTAL) 150 MG tablet Take 1 tablet (150 mg total) by mouth 2 (two) times daily. For mood 07/22/19   [name], [name], MD  lithium carbonate 300 MG capsule Take 2 capsule in AM and 3 capsule at bed time 07/22/19   [name], [name], MD  metFORMIN (GLUCOPHAGE) 500 MG tablet Take 1 tablet (500 mg total) by mouth 2 (two) times daily with a meal. 04/14/19   [name], MD  metoprolol succinate (TOPROL-XL) 100 MG 24 hr tablet Take 1 tablet (100 mg total) by mouth daily. Take with or immediately following a meal. 05/14/19 08/12/19  [name], MD  Vitamin D, Ergocalciferol, (DRISDOL) 1.25 MG (50000 UT) CAPS capsule Take 1 capsule (50,000 Units total) by mouth every 7 (seven) days. 04/14/19   [name], MD    Allergies    Penicillins  Review of Systems   Review of Systems All other systems are reviewed and are negative for acute change except as noted in the HPI.   Physical Exam Updated Vital Signs BP (!) 169/81 (BP Location: Right Arm)   Pulse (!) 102   Temp 99 F (37.2 C) (Oral)   Resp 18   LMP 07/29/2019 (Approximate)   SpO2 95%   Physical Exam Vitals and nursing note reviewed.  Constitutional:      General: She is not in acute distress.    Appearance: She is obese. She is not  ill-appearing.  HENT:     Head: Normocephalic and atraumatic.     Right Ear: Tympanic membrane and external ear normal.     Left Ear: Tympanic membrane and external ear normal.     Nose: Nose normal.     Mouth/Throat:     Mouth: Mucous membranes are moist.     Pharynx: Oropharynx is clear.  Eyes:     General: No scleral icterus.       Right eye: No discharge.        Left eye: No discharge.     Extraocular Movements: Extraocular movements intact.     Conjunctiva/sclera: Conjunctivae normal.     Pupils: Pupils are equal, round, and reactive to light.  Neck:     Vascular: No JVD.  Cardiovascular:     Rate and Rhythm: Normal rate and regular rhythm.     Pulses: Normal pulses.          Radial pulses are 2+ on the right side and 2+ on the left side.     Heart sounds: Normal heart sounds.  Pulmonary:     Comments: Lung sounds diminished throughout. She is tachypneic on exam. She is speaking in short sentences. Symmetric chest rise. No wheezing, rales, or rhonchi. Chest:     Chest wall: No tenderness.  Abdominal:     Comments: Abdomen is soft, non-distended, and non-tender in all quadrants. No rigidity, no guarding. No peritoneal signs.  Musculoskeletal:        General: Normal range of motion.     Cervical back: Normal range of motion.     Right lower leg: 1+ Edema present.     Left lower leg: 1+ Edema present.  Skin:    General: Skin is warm and dry.  Capillary Refill: Capillary refill takes less than 2 seconds.     Findings: No rash.  Neurological:     Mental Status: She is oriented to person, place, and time.     GCS: GCS eye subscore is 4. GCS verbal subscore is 5. GCS motor subscore is 6.     Comments: Fluent speech, no facial droop.  Psychiatric:        Behavior: Behavior normal.     ED Results / Procedures / Treatments   Labs (all labs ordered are listed, but only abnormal results are displayed) Labs Reviewed - No data to display  EKG EKG Interpretation   Date/Time:  Saturday August 07 2019 12:20:31 EST Ventricular Rate:  97 PR Interval:    QRS Duration: 86 QT Interval:  358 QTC Calculation: 455 R Axis:   70 Text Interpretation: Sinus rhythm Atrial premature complex Probable left atrial enlargement No STEMI Confirmed by Nanda Quinton [phone]) on 08/07/2019 12:23:09 PM   Radiology PORTABLE CHEST 1 VIEW    COMPARISON: Chest radiograph 01/30/2019    FINDINGS:  Stable cardiomediastinal contours with enlarged heart size. Central  vascular congestion without overt edema. No new focal infiltrate. No  pneumothorax or large pleural effusion. No acute finding in the  visualized skeleton.    IMPRESSION:  Cardiomegaly with central vascular congestion, no overt edema or  active infection.      Electronically Signed  By: Audie Pinto M.D.  On: 08/07/2019 13:01    Procedures Procedures (including critical care time)  Medications Ordered in ED Medications - No data to display  ED Course  I have reviewed the triage vital signs and the nursing notes.  Pertinent labs & imaging results that were available during my care of the patient were reviewed by me and considered in my medical decision making (see chart for details).    MDM Rules/Calculators/A&P                      Patient seen and examined.  On arrival she has low-grade temp of 99, slightly tachycardic at 102, is not hypoxic.  On my exam patient is tachypneic.  She is speaking in short sentences.  There is no wheezing heard but lung sounds are diminished throughout.  During my exam SPO2 ranges from 92-94 percent on room air.  She has no abdominal pain, no peritoneal signs.  She does have 1+ bilateral pitting edema.  EKG without ischemic changes.  CBC without leukocytosis, hemoglobin 11.6.  Patient has history of anemia, 1 month ago it was 12.4.  BMP without electrolyte derangement, no renal insufficiency.  hCG is negative.  D-dimer negative at 0.5.  First troponin is 2.   Chest x-ray viewed by me shows cardiomegaly with central vascular congestion, no overt edema or active infection.  Patient ambulated in the department with SPO2 in the upper 90s or she did have shortness of breath.  Patient updated on plan of care. She continues to be  Tachypneic, respiratory rate 28-33, SpO2 is 93-94%. We will add on BNP hepatic function panel.  Rapid Covid was negative, patient aware this could be false negative.  Will perform 6 - 24 hour test.  Patient care transferred to E. Hammond PA-C  at the end of my shift pending BNP. Patient presentation, ED course, and plan of care discussed with review of all pertinent labs and imaging. Please see her note for further details regarding further ED course and disposition.  Final Clinical Impression(s) / ED Diagnoses  Final diagnoses:  None    Rx / DC Orders ED Discharge Orders    None       Flint Melter 08/07/19 1901    Margette Fast, MD 08/07/19 2031

## 2019-08-07 NOTE — Progress Notes (Signed)
Lovenox per Pharmacy for DVT Prophylaxis    Pharmacy has been consulted from dosing enoxaparin (lovenox) in this patient for DVT prophylaxis.  The pharmacist has reviewed pertinent labs (Hgb _11.6__; PLT_285__), patient weight (_152__kg) and renal function (CrCl_>90__mL/min) and decided that enoxaparin _60_mg SQ Q24Hrs is appropriate for this patient.  The pharmacy department will sign off at this time.  Please reconsult pharmacy if status changes or for further issues.  Thank you  Cyndia Diver PharmD, BCPS  08/07/2019, 10:41 PM

## 2019-08-07 NOTE — ED Notes (Signed)
Pt placed on purewick due to oxygen saturations dropping while ambulating to bedside commode.

## 2019-08-07 NOTE — ED Notes (Signed)
Family at bedside. 

## 2019-08-07 NOTE — ED Notes (Signed)
Extra tubes were sent to lab. Contacted lab to add the hepatic fn and bnp. Lab agreed.

## 2019-08-07 NOTE — H&P (Signed)
History and Physical    Christine [name] MEB:[id number] DOB: 09-Jun-1976 DOA: 08/07/2019  PCP: Maude Leriche, PA-C  Patient coming from: Home  I have personally briefly reviewed patient's old medical records in [place]  Chief Complaint: worsening shortness of breath  HPI: Christine [name] is a 44 y.o. female with medical history significant of hypertension, OSA not yet on CPAP, bipolar disorder type I, PCOS, and anemia who presents with concerns of worsening shortness of breath.  Patient has seen cardiology outpatient for symptoms of exertional dyspnea and chest pain for the past few months but felt this was slightly different since it was more of related to her breathing rather than chest pain.  She notes shortness of breath that is worse with exertion but is still persistent at rest. She knows that she is "out of shape" but feels her breathing is now worse. Also notes orthopnea symptoms and difficulty sleeping due to her breathing issues.  She also has intermittent "pinching" chest pain that is mostly left-sided but sometimes also is midsternal to right-sided.  This chest pain is worse with her shortness of breath.  She initially thought this was all due to bronchitis as she noticed wheezing at rest several days ago which has now resolved.  Denies any fever and only an occasional dry cough. Notes swelling of her ankles for the past few months but has arthritic flareups occasionally. Blood pressure has been elevated for the past few months but it is usually controlled with anti-hypertensive. Denies any travel. No new rashes. Her mom passed away in Oct 11, 2017 so the holiday was "more emotional" but denies any significant stressors.  She has been mostly compliant with her bipolar medication other than Lithium which she only takes occasionally because she is concerned about side effects.  Denies any tobacco or illicit drug use.  Rare alcohol use.  Father has hx of CHF in his 24s and significant  family hx of hypertension.   Per documentation with outpatient cardiology, she had a coronary CTA in August 2020 that showed no evidence of CAD. Had echocardiogram in 06/2019 with no findings of pulmonary hypertension and EF of 65-70% and no valvular abnormalities. She was noted to have sinus tachycardia and was started on Toprol. Her dyspnea was thought by cardiology to be related to her weight and OSA. She was referred for sleep study evaluate but has not yet been fitted for CPAP per patient.    ED Course: She was afebrile, hypertensive with BP up to 160/80 on room air. Normal respirations at rest but appears more dyspneic when speaking. WBC of 9.3, hemoglobin of 11.6. Unremarkable CMP. BNP of 16.1. Troponin of 2 flat. D-dimer of 0.50. CXR showed cardiomegaly and central vascular congestion. CTA chest showed normal heart size with a limited exam showing no central, main or lobar pulmonary embolism.  POC COVID test negative.  She was given IV lasix 64m in the ED.   Review of Systems:  Constitutional: No Weight Change, No Fever ENT/Mouth: No sore throat, No Rhinorrhea Eyes: No Vision Changes Cardiovascular: + Chest Pain, +SOB, +PND, + Dyspnea on Exertion, + Orthopnea,+ Edema, No Palpitations Respiratory:+ Cough, No Sputum, +Wheezing, Gastrointestinal: No Nausea, No Vomiting, +occasional Diarrhea this week,  No Pain Genitourinary:+ Urinary Incontinence, +dysuria  Musculoskeletal: No Arthralgias, No Myalgias Skin: No Skin Lesions, No Pruritus Neuro: no Weakness, No Numbness Psych: No Anxiety/Panic, No Depression, no decrease appetite Heme/Lymph: No Bruising, No Bleeding  Past Medical History:  Diagnosis Date   Anemia  Presumably from menorrhagia/menomenorrhagia; has had both blood and iron transfusions   Anxiety    Back pain    Bipolar 1 disorder ([place])    With depression   Depression    Essential hypertension    Fibromyalgia    Headache    History of blood transfusion  08/2017   [facility]   History of hiatal hernia    Infertility, female    Lactose intolerance    Lower extremity edema    Migraines    Morbid obesity (HCC)    Osteoarthritis    Hands, ankles; HLA-B27 positive   PCOS (polycystic ovarian syndrome)    Seasonal allergies    With recurrent allergic rhinitis   Uterine leiomyoma    Vitamin D deficiency     Past Surgical History:  Procedure Laterality Date   CARDIAC CT ANGIOGRAM  03/2019    Ca Score 0. Normal Coronary origin R dominant. No evidence of CAD. ? Liver nodules - poorly visualized (consider MRI w & w/o Gad contrast)   CERVICAL POLYPECTOMY  01/15/2018   Procedure: CERVICAL POLYPECTOMY;  Surgeon: Arvella Nigh, MD;  Location: [facility];  Service: Gynecology;;   COLONOSCOPY  2008   Normal   DIAGNOSTIC LAPAROSCOPY  08/1999   dermoid cyst, [facility]   DILATION AND CURETTAGE OF UTERUS  05/2004   MAB   DILATION AND CURETTAGE OF UTERUS  04/2015   HYSTEROSCOPY WITH D & C  07/22/2011   Procedure: DILATATION AND CURETTAGE /HYSTEROSCOPY;  Surgeon: Cyril Mourning, MD;  Location: [facility];  Service: Gynecology;;   HYSTEROSCOPY WITH D & C N/A 01/15/2018   Procedure: DILATATION AND CURETTAGE [place] WITH MYOSURE;  Surgeon: Arvella Nigh, MD;  Location: [facility];  Service: Gynecology;  Laterality: N/A;   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  Suggestion of high cardiac output (possibly related to anemia thyrotoxicosis, Pregnancy, sepsis etc.) -- was in setting of symptomatic anemia - Hgb 5.7     reports that she has never smoked. She has never used smokeless tobacco. She reports current alcohol use. She reports that she does not use drugs.  Allergies  Allergen Reactions   Penicillins Rash and Other (See Comments)    Has patient had a PCN reaction causing immediate rash, facial/tongue/throat swelling, SOB or lightheadedness with hypotension:  Yes Has patient had a PCN reaction causing severe rash involving mucus membranes or skin necrosis: Unknown Has patient had a PCN reaction that required hospitalization: No Has patient had a PCN reaction occurring within the last 10 years: No If all of the above answers are "NO", then may proceed with Cephalosporin use.     Family History  Problem Relation Age of Onset   Anxiety disorder Mother    Depression Mother    Cancer Mother        pancreatic    High blood pressure Mother    Hypertension Mother    Hyperlipidemia Mother    Cancer Father        colon   High blood pressure Father    Diabetes Father    Hypertension Father    Hyperlipidemia Father    Heart disease Father    Stroke Father    Kidney disease Father    Sleep apnea Father    Obesity Father    Cancer Brother 6       leukemia, childhood   High blood pressure  Brother    Migraines Neg Hx      Prior to Admission medications   Medication Sig Start Date End Date Taking? Authorizing Provider  acetaminophen (TYLENOL) 325 MG tablet Take 650 mg by mouth every 6 (six) hours as needed for mild pain or headache.   Yes [provider]  amLODipine (NORVASC) 10 MG tablet Take 1 tablet (10 mg total) by mouth daily. 08/29/17  Yes Eugenie Filler, MD  ARIPiprazole (ABILIFY) 15 MG tablet Take 1 tablet (15 mg total) by mouth daily. 07/22/19 07/21/20 Yes [name], [name], MD  clonazePAM (KLONOPIN) 0.5 MG tablet Take 1 tablet (0.5 mg total) by mouth daily as needed for anxiety. 07/22/19  Yes [name], [name], MD  fluticasone (FLONASE) 50 MCG/ACT nasal spray Place into both nostrils daily as needed for allergies or rhinitis.   Yes [provider]  lamoTRIgine (LAMICTAL) 150 MG tablet Take 1 tablet (150 mg total) by mouth 2 (two) times daily. For mood 07/22/19  Yes [name], [name], MD  lithium carbonate 300 MG capsule Take 2 capsule in AM and 3 capsule at bed time Patient taking differently: Take  600-900 mg by mouth See admin instructions. Take 2 capsules in AM and 3 capsulse at bed time 07/22/19  Yes [name], [name], MD  metFORMIN (GLUCOPHAGE) 500 MG tablet Take 1 tablet (500 mg total) by mouth 2 (two) times daily with a meal. 04/14/19  Yes Leafy Ro, Caren D, MD  metoprolol succinate (TOPROL-XL) 100 MG 24 hr tablet Take 1 tablet (100 mg total) by mouth daily. Take with or immediately following a meal. 05/14/19 08/12/19 Yes [name], MD  Vitamin D, Ergocalciferol, (DRISDOL) 1.25 MG (50000 UT) CAPS capsule Take 1 capsule (50,000 Units total) by mouth every 7 (seven) days. 04/14/19  Yes Beasley, Caren D, MD  Erenumab-aooe (AIMOVIG) 140 MG/ML SOAJ Inject 140 mg into the skin every 30 (thirty) days. Patient not taking: Reported on 08/07/2019 09/21/18   Melvenia Beam, MD    Physical Exam: Vitals:   08/07/19 1622 08/07/19 1819 08/07/19 2130 08/07/19 2215  BP:  (!) 154/82 (!) 145/86 139/79  Pulse:  84 78 79  Resp:  18 (!) 21 15  Temp:  98.2 F (36.8 C)    TempSrc:  Oral    SpO2:  92% 92% 94%  Weight: (!) 152 kg     Height: _0  (1.626 m)       Constitutional: NAD, calm, comfortable, non toxic morbidly obese female sitting up at 45 degree incline Vitals:   08/07/19 1622 08/07/19 1819 08/07/19 2130 08/07/19 2215  BP:  (!) 154/82 (!) 145/86 139/79  Pulse:  84 78 79  Resp:  18 (!) 21 15  Temp:  98.2 F (36.8 C)    TempSrc:  Oral    SpO2:  92% 92% 94%  Weight: (!) 152 kg     Height: _1  (1.626 m)      Eyes: PERRL, lids and conjunctivae normal ENMT: Mucous membranes are moist.  Neck: normal, supple, difficult to access JVD due to body habitus Respiratory: clear to auscultation bilaterally, no wheezing, no crackles. Normal respiratory effort on ambient air at rest but dyspneic when speaking. No accessory muscle use.  Cardiovascular: Regular rate and rhythm, 3/6 systolic murmur heard best at left sternal border. +2 pitting edema of the pre-tibial region of bilateral LE. Abdomen: no  tenderness, no masses palpated. Bowel sounds positive.  Musculoskeletal: no clubbing / cyanosis. No joint deformity upper  and lower extremities. no contractures. Normal muscle tone.  Skin: Acanthosis nigricans skin changes on mid thoracic back Neurologic: CN 2-12 grossly intact. Sensation intact, Strength 5/5 in all 4.  Psychiatric: Normal judgment and insight. Alert and oriented x 3. Normal mood.     Labs on Admission: I have personally reviewed following labs and imaging studies  CBC: Recent Labs  Lab 08/07/19 1424  WBC 9.3  NEUTROABS 6.7  HGB 11.6*  HCT 37.7  MCV 97.4  PLT 194   Basic Metabolic Panel: Recent Labs  Lab 08/07/19 1424  NA 140  K 3.7  CL 105  CO2 25  GLUCOSE 96  BUN 10  CREATININE 0.78  CALCIUM 9.4   GFR: Estimated Creatinine Clearance: 135.4 mL/min (by C-G formula based on SCr of 0.78 mg/dL). Liver Function Tests: Recent Labs  Lab 08/07/19 1424  AST 16  ALT 26  ALKPHOS 53  BILITOT 0.6  PROT 7.5  ALBUMIN 4.2   No results for input(s): LIPASE, AMYLASE in the last 168 hours. No results for input(s): AMMONIA in the last 168 hours. Coagulation Profile: No results for input(s): INR, PROTIME in the last 168 hours. Cardiac Enzymes: No results for input(s): CKTOTAL, CKMB, CKMBINDEX, TROPONINI in the last 168 hours. BNP (last 3 results) No results for input(s): PROBNP in the last 8760 hours. HbA1C: No results for input(s): HGBA1C in the last 72 hours. CBG: No results for input(s): GLUCAP in the last 168 hours. Lipid Profile: No results for input(s): CHOL, HDL, LDLCALC, TRIG, CHOLHDL, LDLDIRECT in the last 72 hours. Thyroid Function Tests: No results for input(s): TSH, T4TOTAL, FREET4, T3FREE, THYROIDAB in the last 72 hours. Anemia Panel: No results for input(s): VITAMINB12, FOLATE, FERRITIN, TIBC, IRON, RETICCTPCT in the last 72 hours. Urine analysis:    Component Value Date/Time   COLORURINE YELLOW 08/08/2018 1312   APPEARANCEUR CLEAR  08/08/2018 1312   LABSPEC 1.011 08/08/2018 1312   PHURINE 7.0 08/08/2018 1312   GLUCOSEU NEGATIVE 08/08/2018 1312   HGBUR NEGATIVE 08/08/2018 1312   BILIRUBINUR NEGATIVE 08/08/2018 1312   KETONESUR NEGATIVE 08/08/2018 1312   PROTEINUR NEGATIVE 08/08/2018 1312   UROBILINOGEN 1.0 01/20/2010 1111   NITRITE NEGATIVE 08/08/2018 1312   LEUKOCYTESUR NEGATIVE 08/08/2018 1312    Radiological Exams on Admission: CT Angio Chest PE W/Cm &/Or Wo Cm  Result Date: 08/07/2019 CLINICAL DATA:  Shortness of breath EXAM: CT ANGIOGRAPHY CHEST WITH CONTRAST TECHNIQUE: Multidetector CT imaging of the chest was performed using the standard protocol during bolus administration of intravenous contrast. Multiplanar CT image reconstructions and MIPs were obtained to evaluate the vascular anatomy. CONTRAST:  11m OMNIPAQUE IOHEXOL 350 MG/ML SOLN COMPARISON:  01/30/2019 FINDINGS: Cardiovascular: Examination is limited secondary to suboptimal contrast bolus timing, respiratory motion, and beam hardening artifact related to patient body habitus. There is no evidence of central, main, or lobar branch filling defect to suggest pulmonary embolism. The segmental and subsegmental branch arteries are suboptimally evaluated. Prominence of the pulmonary vasculature suggesting venous congestion. Heart size is normal. Thoracic aorta is nonaneurysmal. Mediastinum/Nodes: No enlarged mediastinal, hilar, or axillary lymph nodes. Thyroid gland, trachea, and esophagus demonstrate no significant findings. Lungs/Pleura: No focal airspace consolidation, pleural effusion, or pneumothorax. Upper Abdomen: No acute abnormality. Musculoskeletal: No chest wall abnormality. No acute or significant osseous findings. Review of the MIP images confirms the above findings. IMPRESSION: 1. Slightly limited exam. There is no evidence of central, main, or lobar branch filling defect to suggest pulmonary embolism. 2. Findings suggesting pulmonary vascular congestion.  No airspace consolidation or overt pulmonary edema. Electronically Signed   By: Davina Poke D.O.   On: 08/07/2019 17:52   DG Chest Portable 1 View  Result Date: 08/07/2019 CLINICAL DATA:  Pt presents with c/o cough, shortness of breath, and wheezing since Wednesday. Medical hx of HTN. EXAM: PORTABLE CHEST 1 VIEW COMPARISON:  Chest radiograph 01/30/2019 FINDINGS: Stable cardiomediastinal contours with enlarged heart size. Central vascular congestion without overt edema. No new focal infiltrate. No pneumothorax or large pleural effusion. No acute finding in the visualized skeleton. IMPRESSION: Cardiomegaly with central vascular congestion, no overt edema or active infection. Electronically Signed   By: Audie Pinto M.D.   On: 08/07/2019 13:01    EKG: Independently reviewed.  Assessment/Plan  Pulmonary edema CTA limited but no PE, normal heart size. BNP was low although pt is morbidly obese so likely inaccurate.  Unsure if this is due to heart failure because she recent had negative coronary CTA. She was diagnosed with OSA not yet on CPAP but echo without pulmonary HTN in 06/2019.  Consult cardiology to see if it that is value in repeating another echo and for further recommendation Consider pulmonology consult Check TSH since she is on lithium althought inconsistently Trial Lasix IV 38m for now strict Is and Os  daily weights   Bipolar disorder Type 1 Mood has been stable Continue Lamictal, Abilify, Klonopin PRN Continue Lithium  Check Lithium level- no signs of toxicity and she has been taking this inconsistently  Sinus tachycardia continue Toprol   Hypertension continue amlodipine and Toprol   OSA/Suspect hypoventilation syndrome with morbid obesity  Has not yet been fitted for CPAP outpatient- will need follow up outpatient  DVT prophylaxis:.Lovenox Code Status: Full Family Communication: Plan discussed with patient at bedside  disposition Plan: Home with  observation Consults called:  Admission status: Observation  Christine Valenza T Hilmer Aliberti DO [facility]   If 7PM-7AM, please contact night-coverage [email or web address] Password [email or web address] 08/07/2019, 10:36 PM

## 2019-08-07 NOTE — ED Notes (Signed)
Pt's SpO2 w/ ambulating around room multiple times maintained upper 90's, with highest being 98% and lowest 93% pt stated she had a "Little bit" of shortness of breath and light headedness w/ ambulation w/ clear waveform.

## 2019-08-08 DIAGNOSIS — F319 Bipolar disorder, unspecified: Secondary | ICD-10-CM

## 2019-08-08 DIAGNOSIS — R0602 Shortness of breath: Secondary | ICD-10-CM | POA: Diagnosis not present

## 2019-08-08 DIAGNOSIS — E662 Morbid (severe) obesity with alveolar hypoventilation: Secondary | ICD-10-CM

## 2019-08-08 DIAGNOSIS — Z833 Family history of diabetes mellitus: Secondary | ICD-10-CM | POA: Diagnosis not present

## 2019-08-08 DIAGNOSIS — J811 Chronic pulmonary edema: Secondary | ICD-10-CM | POA: Diagnosis present

## 2019-08-08 DIAGNOSIS — I10 Essential (primary) hypertension: Secondary | ICD-10-CM | POA: Diagnosis not present

## 2019-08-08 DIAGNOSIS — M19042 Primary osteoarthritis, left hand: Secondary | ICD-10-CM | POA: Diagnosis present

## 2019-08-08 DIAGNOSIS — Z7984 Long term (current) use of oral hypoglycemic drugs: Secondary | ICD-10-CM | POA: Diagnosis not present

## 2019-08-08 DIAGNOSIS — Z20822 Contact with and (suspected) exposure to covid-19: Secondary | ICD-10-CM | POA: Diagnosis present

## 2019-08-08 DIAGNOSIS — L83 Acanthosis nigricans: Secondary | ICD-10-CM | POA: Diagnosis present

## 2019-08-08 DIAGNOSIS — Z841 Family history of disorders of kidney and ureter: Secondary | ICD-10-CM | POA: Diagnosis not present

## 2019-08-08 DIAGNOSIS — M19271 Secondary osteoarthritis, right ankle and foot: Secondary | ICD-10-CM | POA: Diagnosis present

## 2019-08-08 DIAGNOSIS — Z8349 Family history of other endocrine, nutritional and metabolic diseases: Secondary | ICD-10-CM | POA: Diagnosis not present

## 2019-08-08 DIAGNOSIS — G4733 Obstructive sleep apnea (adult) (pediatric): Secondary | ICD-10-CM | POA: Diagnosis not present

## 2019-08-08 DIAGNOSIS — M19041 Primary osteoarthritis, right hand: Secondary | ICD-10-CM | POA: Diagnosis present

## 2019-08-08 DIAGNOSIS — Z79899 Other long term (current) drug therapy: Secondary | ICD-10-CM | POA: Diagnosis not present

## 2019-08-08 DIAGNOSIS — Z823 Family history of stroke: Secondary | ICD-10-CM | POA: Diagnosis not present

## 2019-08-08 DIAGNOSIS — I110 Hypertensive heart disease with heart failure: Secondary | ICD-10-CM | POA: Diagnosis present

## 2019-08-08 DIAGNOSIS — Z818 Family history of other mental and behavioral disorders: Secondary | ICD-10-CM | POA: Diagnosis not present

## 2019-08-08 DIAGNOSIS — Z8249 Family history of ischemic heart disease and other diseases of the circulatory system: Secondary | ICD-10-CM | POA: Diagnosis not present

## 2019-08-08 DIAGNOSIS — J810 Acute pulmonary edema: Secondary | ICD-10-CM | POA: Diagnosis not present

## 2019-08-08 DIAGNOSIS — Z6841 Body Mass Index (BMI) 40.0 and over, adult: Secondary | ICD-10-CM | POA: Diagnosis not present

## 2019-08-08 DIAGNOSIS — Z880 Allergy status to penicillin: Secondary | ICD-10-CM | POA: Diagnosis not present

## 2019-08-08 DIAGNOSIS — Z806 Family history of leukemia: Secondary | ICD-10-CM | POA: Diagnosis not present

## 2019-08-08 DIAGNOSIS — E282 Polycystic ovarian syndrome: Secondary | ICD-10-CM | POA: Diagnosis present

## 2019-08-08 DIAGNOSIS — J9601 Acute respiratory failure with hypoxia: Secondary | ICD-10-CM | POA: Diagnosis present

## 2019-08-08 DIAGNOSIS — I5031 Acute diastolic (congestive) heart failure: Secondary | ICD-10-CM | POA: Diagnosis present

## 2019-08-08 DIAGNOSIS — M19272 Secondary osteoarthritis, left ankle and foot: Secondary | ICD-10-CM | POA: Diagnosis present

## 2019-08-08 DIAGNOSIS — Z90721 Acquired absence of ovaries, unilateral: Secondary | ICD-10-CM | POA: Diagnosis not present

## 2019-08-08 LAB — BASIC METABOLIC PANEL
Anion gap: 8 (ref 5–15)
BUN: 10 mg/dL (ref 6–20)
CO2: 26 mmol/L (ref 22–32)
Calcium: 9.4 mg/dL (ref 8.9–10.3)
Chloride: 105 mmol/L (ref 98–111)
Creatinine, Ser: 0.79 mg/dL (ref 0.44–1.00)
GFR calc Af Amer: >60 mL/min (ref >60)
GFR calc non Af Amer: >60 mL/min (ref >60)
Glucose, Bld: 104 mg/dL — ABNORMAL HIGH (ref 70–99)
Potassium: 3.8 mmol/L (ref 3.5–5.1)
Sodium: 139 mmol/L (ref 135–145)

## 2019-08-08 LAB — URINALYSIS, ROUTINE W REFLEX MICROSCOPIC
Bilirubin Urine: NEGATIVE
Glucose, UA: NEGATIVE mg/dL
Hgb urine dipstick: NEGATIVE
Ketones, ur: NEGATIVE mg/dL
Leukocytes,Ua: NEGATIVE
Nitrite: NEGATIVE
Protein, ur: NEGATIVE mg/dL
Specific Gravity, Urine: 1.011 (ref 1.005–1.030)
pH: 6 (ref 5.0–8.0)

## 2019-08-08 LAB — CBC
HCT: 37.5 % (ref 36.0–46.0)
Hemoglobin: 11.4 g/dL — ABNORMAL LOW (ref 12.0–15.0)
MCH: 30 pg (ref 26.0–34.0)
MCHC: 30.4 g/dL (ref 30.0–36.0)
MCV: 98.7 fL (ref 80.0–100.0)
Platelets: 296 10*3/uL (ref 150–400)
RBC: 3.8 MIL/uL — ABNORMAL LOW (ref 3.87–5.11)
RDW: 15.9 % — ABNORMAL HIGH (ref 11.5–15.5)
WBC: 8.3 10*3/uL (ref 4.0–10.5)
nRBC: 0 % (ref 0.0–0.2)

## 2019-08-08 LAB — HIV ANTIBODY (ROUTINE TESTING W REFLEX): HIV Screen 4th Generation wRfx: NONREACTIVE

## 2019-08-08 LAB — LITHIUM LEVEL: Lithium Lvl: 0.12 mmol/L — ABNORMAL LOW (ref 0.60–1.20)

## 2019-08-08 LAB — SARS CORONAVIRUS 2 (TAT 6-24 HRS): SARS Coronavirus 2: NEGATIVE

## 2019-08-08 LAB — TSH: TSH: 1.215 u[IU]/mL (ref 0.350–4.500)

## 2019-08-08 MED ORDER — LITHIUM CARBONATE 300 MG PO CAPS
600.0000 mg | ORAL_CAPSULE | Freq: Every day | ORAL | Status: DC
  Administered 2019-08-08 – 2019-08-10 (×3): 600 mg via ORAL
  Filled 2019-08-08 (×3): qty 2

## 2019-08-08 NOTE — ED Notes (Signed)
ED TO INPATIENT HANDOFF REPORT  ED Nurse Name and Phone #: jon wled   S Name/Age/Gender Christine [name] 44 y.o. female Room/Bed: WA24/WA24  Code Status   Code Status: Full Code  Home/SNF/Other Home Patient oriented to: self, place, time and situation Is this baseline? Yes   Triage Complete: Triage complete  Chief Complaint Pulmonary edema [J81.1]  Triage Note Pt presents with c/o cough, shortness of breath, and wheezing. Pt reports that she has been experiencing these symptoms since Wednesday, denies any Covid exposures.     Allergies Allergies  Allergen Reactions  . Penicillins Rash and Other (See Comments)    Has patient had a PCN reaction causing immediate rash, facial/tongue/throat swelling, SOB or lightheadedness with hypotension: Yes Has patient had a PCN reaction causing severe rash involving mucus membranes or skin necrosis: Unknown Has patient had a PCN reaction that required hospitalization: No Has patient had a PCN reaction occurring within the last 10 years: No If all of the above answers are "NO", then may proceed with Cephalosporin use.     Level of Care/Admitting Diagnosis ED Disposition    ED Disposition Condition Comment   Admit  Hospital Area: [facility] [[id number]]  Level of Care: Telemetry [5]  Admit to tele based on following criteria: Acute CHF  Covid Evaluation: Asymptomatic Screening Protocol (No Symptoms)  Diagnosis: Pulmonary edema [716967]  Admitting Physician: Orene Desanctis [[id number]]  Attending Physician: Orene Desanctis [[id number]]  PT Class (Do Not Modify): Observation [104]  PT Acc Code (Do Not Modify): Observation [10022]       B Medical/Surgery History Past Medical History:  Diagnosis Date  . Anemia    Presumably from menorrhagia/menomenorrhagia; has had both blood and iron transfusions  . Anxiety   . Back pain   . Bipolar 1 disorder ([place])    With depression  . Depression   . Essential hypertension   .  Fibromyalgia   . Headache   . History of blood transfusion 08/2017   [facility]  . History of hiatal hernia   . Infertility, female   . Lactose intolerance   . Lower extremity edema   . Migraines   . Morbid obesity ([place])   . Osteoarthritis    Hands, ankles; HLA-B27 positive  . PCOS (polycystic ovarian syndrome)   . Seasonal allergies    With recurrent allergic rhinitis  . Uterine leiomyoma   . Vitamin D deficiency    Past Surgical History:  Procedure Laterality Date  . CARDIAC CT ANGIOGRAM  03/2019    Ca Score 0. Normal Coronary origin R dominant. No evidence of CAD. ? Liver nodules - poorly visualized (consider MRI w & w/o Gad contrast)  . CERVICAL POLYPECTOMY  01/15/2018   Procedure: CERVICAL POLYPECTOMY;  Surgeon: Arvella Nigh, MD;  Location: [facility];  Service: Gynecology;;  . COLONOSCOPY  2008   Normal  . DIAGNOSTIC LAPAROSCOPY  08/1999   dermoid cyst, [facility]  . DILATION AND CURETTAGE OF UTERUS  05/2004   [facility]  . DILATION AND CURETTAGE OF UTERUS  04/2015  . HYSTEROSCOPY WITH D & C  07/22/2011   Procedure: DILATATION AND CURETTAGE /HYSTEROSCOPY;  Surgeon: Cyril Mourning, MD;  Location: [facility];  Service: Gynecology;;  . HYSTEROSCOPY WITH D & C N/A 01/15/2018   Procedure: DILATATION AND CURETTAGE [place] WITH MYOSURE;  Surgeon: Arvella Nigh, MD;  Location: [facility];  Service: Gynecology;  Laterality: N/A;  . 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 TRANSTHORACIC  ECHOCARDIOGRAM  08/2017    EF 65 to 70% with vigorous wall motion.  Suggestion of high cardiac output (possibly related to anemia thyrotoxicosis, Pregnancy, sepsis etc.) -- was in setting of symptomatic anemia - Hgb 5.7     A IV Location/Drains/Wounds Patient Lines/Drains/Airways Status   Active Line/Drains/Airways    Name:   Placement date:   Placement time:   Site:   Days:   Peripheral IV 08/07/19 Left Antecubital   08/07/19    1421    Antecubital   1   Incision 07/22/11 Perineum Other (Comment)   07/22/11    1441      2939   Incision (Closed) 01/15/18 Perineum   01/15/18    0706     570          Intake/Output Last 24 hours No intake or output data in the 24 hours ending 08/08/19 0405  Labs/Imaging Results for orders placed or performed during the hospital encounter of 08/07/19 (from the past 48 hour(s))  CBC with Differential     Status: Abnormal   Collection Time: 08/07/19  2:24 PM  Result Value Ref Range   WBC 9.3 4.0 - 10.5 K/uL   RBC 3.87 3.87 - 5.11 MIL/uL   Hemoglobin 11.6 (L) 12.0 - 15.0 g/dL   HCT 37.7 36.0 - 46.0 %   MCV 97.4 80.0 - 100.0 fL   MCH 30.0 26.0 - 34.0 pg   MCHC 30.8 30.0 - 36.0 g/dL   RDW 15.3 11.5 - 15.5 %   Platelets 285 150 - 400 K/uL   nRBC 0.2 0.0 - 0.2 %   Neutrophils Relative % 73 %   Neutro Abs 6.7 1.7 - 7.7 K/uL   Lymphocytes Relative 16 %   Lymphs Abs 1.5 0.7 - 4.0 K/uL   Monocytes Relative 8 %   Monocytes Absolute 0.7 0.1 - 1.0 K/uL   Eosinophils Relative 1 %   Eosinophils Absolute 0.1 0.0 - 0.5 K/uL   Basophils Relative 0 %   Basophils Absolute 0.0 0.0 - 0.1 K/uL   Immature Granulocytes 2 %   Abs Immature Granulocytes 0.19 (H) 0.00 - 0.07 K/uL    Comment: Performed at [facility], [place] [address], [place], [place] [address]  Basic metabolic panel     Status: None   Collection Time: 08/07/19  2:24 PM  Result Value Ref Range   Sodium 140 135 - 145 mmol/L   Potassium 3.7 3.5 - 5.1 mmol/L   Chloride 105 98 - 111 mmol/L   CO2 25 22 - 32 mmol/L   Glucose, Bld 96 70 - 99 mg/dL   BUN 10 6 - 20 mg/dL   Creatinine, Ser 0.78 0.44 - 1.00 mg/dL   Calcium 9.4 8.9 - 10.3 mg/dL   GFR calc non Af Amer >60 >60 mL/min   GFR calc Af Amer >60 >60 mL/min   Anion gap 10 5 - 15    Comment: Performed at [facility], [place] [address], [place], [place] [address]  Troponin I (High Sensitivity)     Status: None   Collection Time: 08/07/19  2:24 PM  Result Value Ref Range   Troponin I (High Sensitivity) 2 <18 ng/L    Comment:  (NOTE) Elevated high sensitivity troponin I (hsTnI) values and significant  changes across serial measurements may suggest ACS but many other  chronic and acute conditions are known to elevate hsTnI results.  Refer to the "Links" section for chest pain algorithms  and additional  guidance. Performed at [facility], [place] [address], [place], [place] [address]   D-dimer, quantitative (not at [facility])     Status: None   Collection Time: 08/07/19  2:24 PM  Result Value Ref Range   D-Dimer, Quant 0.50 0.00 - 0.50 ug/mL-FEU    Comment: (NOTE) At the manufacturer cut-off of 0.50 ug/mL FEU, this assay has been documented to exclude PE with a sensitivity and negative predictive value of 97 to 99%.  At this time, this assay has not been approved by the FDA to exclude DVT/VTE. Results should be correlated with clinical presentation. Performed at [facility], [place] [address] [place], [place] [address]   Hepatic function panel     Status: None   Collection Time: 08/07/19  2:24 PM  Result Value Ref Range   Total Protein 7.5 6.5 - 8.1 g/dL   Albumin 4.2 3.5 - 5.0 g/dL   AST 16 15 - 41 U/L   ALT 26 0 - 44 U/L   Alkaline Phosphatase 53 38 - 126 U/L   Total Bilirubin 0.6 0.3 - 1.2 mg/dL   Bilirubin, Direct <0.1 0.0 - 0.2 mg/dL   Indirect Bilirubin NOT CALCULATED 0.3 - 0.9 mg/dL    Comment: Performed at [facility], [place] [address], [place], [place] [address]  Brain natriuretic peptide     Status: None   Collection Time: 08/07/19  2:24 PM  Result Value Ref Range   B Natriuretic Peptide 16.1 0.0 - 100.0 pg/mL    Comment: Performed at [facility], [place] [address] [place],  [address]  I-Stat beta hCG blood, ED     Status: None   Collection Time: 08/07/19  2:33 PM  Result Value Ref Range   I-stat hCG, quantitative <5.0 <5 mIU/mL   Comment 3            Comment:   GEST. AGE      CONC.  (mIU/mL)   <=1 WEEK        5 -  50     2 WEEKS       50 - 500     3 WEEKS       100 - 10,000     4 WEEKS     1,000 - 30,000        FEMALE AND NON-PREGNANT FEMALE:     LESS THAN 5 mIU/mL   POC SARS Coronavirus 2 Ag-ED - Nasal Swab (BD Veritor Kit)     Status: None   Collection Time: 08/07/19  2:47 PM  Result Value Ref Range   SARS Coronavirus 2 Ag NEGATIVE NEGATIVE    Comment: (NOTE) SARS-CoV-2 antigen NOT DETECTED.  Negative results are presumptive.  Negative results do not preclude SARS-CoV-2 infection and should not be used as the sole basis for treatment or other patient management decisions, including infection  control decisions, particularly in the presence of clinical signs and  symptoms consistent with COVID-19, or in those who have been in contact with the virus.  Negative results must be combined with clinical observations, patient history, and epidemiological information. The expected result is Negative. Fact Sheet for Patients: PodPark.tn Fact Sheet for Healthcare Providers: GiftContent.is This test is not yet approved or cleared by the [place] FDA and  has been authorized for detection and/or diagnosis of SARS-CoV-2 by FDA under an Emergency Use Authorization (EUA).  This EUA will remain in effect (meaning this test can be  used) for the duration of  the COVID-19 de claration under Section 564(b)(1) of the Act, 21 U.S.C. section 360bbb-3(b)(1), unless the authorization is terminated or revoked sooner.   Troponin I (High Sensitivity)     Status: None   Collection Time: 08/07/19  4:18 PM  Result Value Ref Range   Troponin I (High Sensitivity) 2 <18 ng/L    Comment: (NOTE) Elevated high sensitivity troponin I (hsTnI) values and significant  changes across serial measurements may suggest ACS but many other  chronic and acute conditions are known to elevate hsTnI results.  Refer to the "Links" section for chest pain algorithms and  additional  guidance. Performed at [facility], [place] [address] [place], [place] [address]   SARS CORONAVIRUS 2 (TAT 6-24 HRS) Nasopharyngeal Nasopharyngeal Swab     Status: None   Collection Time: 08/07/19  4:18 PM   Specimen: Nasopharyngeal Swab  Result Value Ref Range   SARS Coronavirus 2 NEGATIVE NEGATIVE    Comment: (NOTE) SARS-CoV-2 target nucleic acids are NOT DETECTED. The SARS-CoV-2 RNA is generally detectable in upper and lower respiratory specimens during the acute phase of infection. Negative results do not preclude SARS-CoV-2 infection, do not rule out co-infections with other pathogens, and should not be used as the sole basis for treatment or other patient management decisions. Negative results must be combined with clinical observations, patient history, and epidemiological information. The expected result is Negative. Fact Sheet for Patients: SugarRoll.be Fact Sheet for Healthcare Providers: https://www.woods-mathews.com/ This test is not yet approved or cleared by the [place] FDA and  has been authorized for detection and/or diagnosis of SARS-CoV-2 by FDA under an Emergency Use Authorization (EUA). This EUA will remain  in effect (meaning this test can be used) for the duration of the COVID-19 declaration under Section 56 4(b)(1) of the Act, 21 U.S.C. section 360bbb-3(b)(1), unless the authorization is terminated or revoked sooner. Performed at [facility], [place] [address], [place], [place] [address]   Lithium level     Status: Abnormal   Collection Time: 08/07/19 10:35 PM  Result Value Ref Range   Lithium Lvl 0.12 (L) 0.60 - 1.20 mmol/L    Comment: Performed at [facility], [place] [address] [place], [place] [address]  Urinalysis, Routine w reflex microscopic     Status: Abnormal   Collection Time: 08/07/19 11:25 PM  Result Value Ref Range   Color, Urine STRAW (A)  YELLOW   APPearance CLEAR CLEAR   Specific Gravity, Urine 1.011 1.005 - 1.030   pH 6.0 5.0 - 8.0   Glucose, UA NEGATIVE NEGATIVE mg/dL   Hgb urine dipstick NEGATIVE NEGATIVE   Bilirubin Urine NEGATIVE NEGATIVE   Ketones, ur NEGATIVE NEGATIVE mg/dL   Protein, ur NEGATIVE NEGATIVE mg/dL   Nitrite NEGATIVE NEGATIVE   Leukocytes,Ua NEGATIVE NEGATIVE    Comment: Performed at [place] [address] [place], [place] [address]   CT Angio Chest PE W/Cm &/Or Wo Cm  Result Date: 08/07/2019 CLINICAL DATA:  Shortness of breath EXAM: CT ANGIOGRAPHY CHEST WITH CONTRAST TECHNIQUE: Multidetector CT imaging of the chest was performed using the standard protocol during bolus administration of intravenous contrast. Multiplanar CT image reconstructions and MIPs were obtained to evaluate the vascular anatomy. CONTRAST:  186m OMNIPAQUE IOHEXOL 350 MG/ML SOLN COMPARISON:  01/30/2019 FINDINGS: Cardiovascular: Examination is limited secondary to suboptimal contrast bolus timing, respiratory motion, and beam hardening artifact related to patient body habitus. There is no evidence  of central, main, or lobar branch filling defect to suggest pulmonary embolism. The segmental and subsegmental branch arteries are suboptimally evaluated. Prominence of the pulmonary vasculature suggesting venous congestion. Heart size is normal. Thoracic aorta is nonaneurysmal. Mediastinum/Nodes: No enlarged mediastinal, hilar, or axillary lymph nodes. Thyroid gland, trachea, and esophagus demonstrate no significant findings. Lungs/Pleura: No focal airspace consolidation, pleural effusion, or pneumothorax. Upper Abdomen: No acute abnormality. Musculoskeletal: No chest wall abnormality. No acute or significant osseous findings. Review of the MIP images confirms the above findings. IMPRESSION: 1. Slightly limited exam. There is no evidence of central, main, or lobar branch filling defect to suggest pulmonary embolism. 2.  Findings suggesting pulmonary vascular congestion. No airspace consolidation or overt pulmonary edema. Electronically Signed   By: Davina Poke D.O.   On: 08/07/2019 17:52   DG Chest Portable 1 View  Result Date: 08/07/2019 CLINICAL DATA:  Pt presents with c/o cough, shortness of breath, and wheezing since Wednesday. Medical hx of HTN. EXAM: PORTABLE CHEST 1 VIEW COMPARISON:  Chest radiograph 01/30/2019 FINDINGS: Stable cardiomediastinal contours with enlarged heart size. Central vascular congestion without overt edema. No new focal infiltrate. No pneumothorax or large pleural effusion. No acute finding in the visualized skeleton. IMPRESSION: Cardiomegaly with central vascular congestion, no overt edema or active infection. Electronically Signed   By: Audie Pinto M.D.   On: 08/07/2019 13:01    Pending Labs Unresulted Labs (From admission, onward)    Start     Ordered   08/08/19 0500  TSH  Tomorrow morning,   R     08/07/19 2234   08/08/19 6659  Basic metabolic panel  Tomorrow morning,   R     08/07/19 2234   08/08/19 0500  CBC  Tomorrow morning,   R     08/07/19 2234   08/07/19 2233  HIV Antibody (routine testing w rflx)  (HIV Antibody (Routine testing w reflex) panel)  Once,   STAT     08/07/19 2234          Vitals/Pain Today's Vitals   08/08/19 0130 08/08/19 0215 08/08/19 0300 08/08/19 0345  BP: 127/72 120/86 135/74 134/83  Pulse: 71 77 81 69  Resp: (!) 32 (!) 36 (!) 40 (!) 32  Temp:      TempSrc:      SpO2: 93% (!) 89% 94% (!) 89%  Weight:      Height:      PainSc:        Isolation Precautions No active isolations  Medications Medications  sodium chloride (PF) 0.9 % injection (has no administration in time range)  enoxaparin (LOVENOX) injection 60 mg (60 mg Subcutaneous Given 08/08/19 0403)  furosemide (LASIX) injection 20 mg (has no administration in time range)  acetaminophen (TYLENOL) tablet 650 mg (has no administration in time range)  amLODipine (NORVASC)  tablet 10 mg (has no administration in time range)  metoprolol succinate (TOPROL-XL) 24 hr tablet 100 mg (has no administration in time range)  ARIPiprazole (ABILIFY) tablet 15 mg (has no administration in time range)  lithium carbonate capsule 900 mg (900 mg Oral Given 08/08/19 0402)  clonazePAM (KLONOPIN) tablet 0.5 mg (has no administration in time range)  lamoTRIgine (LAMICTAL) tablet 150 mg (150 mg Oral Given 08/08/19 0402)  lithium carbonate capsule 600 mg (has no administration in time range)  iohexol (OMNIPAQUE) 350 MG/ML injection 100 mL (100 mLs Intravenous Contrast Given 08/07/19 1731)  acetaminophen (TYLENOL) tablet 650 mg (650 mg Oral Given 08/07/19 2235)  furosemide (LASIX)  injection 20 mg (20 mg Intravenous Given 08/07/19 2236)    Mobility walks Low fall risk   Focused Assessments Pulmonary Assessment Handoff:  Lung sounds:   O2 Device: Room Air        R Recommendations: See Admitting Provider Note  Report given to:   Additional Notes:

## 2019-08-08 NOTE — Progress Notes (Signed)
   08/08/19 0504  MEWS Score  Pulse Rate 82  BP (!) 154/77  Temp 97.9 F (36.6 C)  SpO2 98 %  O2 Device Nasal Cannula  MEWS Score  MEWS RR 2  MEWS Pulse 0  MEWS Systolic 0  MEWS LOC 0  MEWS Temp 0  MEWS Score 2  MEWS Score Color Yellow  MEWS Assessment  Is this an acute change? No (Ne admit, will follow the yellow MEWS guideline)  MEWS guidelines implemented *See Row Information* Yellow  Rapid Response Notification  Name of Rapid Response RN Notified NA  pt arrived to floor with an increased respiratory rate, O2 sats 90%. Pt VS are charted similar in the ED. O2 added to help ease work of breathing. Will continue to monitor following yellow MEWS guidelines.

## 2019-08-08 NOTE — Progress Notes (Signed)
PROGRESS NOTE    Christine [name]  [id number] DOB: 04-25-1976 DOA: 08/07/2019 PCP: Scifres, Dorothy, PA-C (Confirm with patient/family/NH records and if not entered, this HAS to be entered at [facility] point of entry. "No PCP" if truly none.)   Brief Narrative: (Start on day 1 of progress note - keep it brief and live) Patient admitted from home with increase in shortness of breath and lower extremity edema. Had extensive cardiac work up recently that was negative for CHF or pulmonary HTN. Has OSA, not wearing CPAP yet at home, awaiting to have fitting to this.  Troponin flat, CTA negative for PE or consolidation. COVID negative. Afebrile. BNP of 16.1. CXR showed cardiomegaly and central vascular congestion.  Reports possible recent increase in sodium intake.    Assessment & Plan:   Active Problems:   Bipolar disorder (HCC)   HTN (hypertension)   Class 3 obesity with alveolar hypoventilation, serious comorbidity, and body mass index (BMI) of 50.0 to 59.9 in adult (HCC)   OSA (obstructive sleep apnea)   Sinus tachycardia   Pulmonary edema   Pulmonary edema CTA limited but no PE, normal heart size. BNP was low although pt is morbidly obese so likely inaccurate. TSH wnl.  Unsure if this is due to heart failure because she recent had negative coronary CTA. She was diagnosed with OSA not yet on CPAP but echo without pulmonary HTN in 06/2019.  Consult to Cardiology apparently has been placed but patient not seen yet today. Will order repeat 2D echo.  Continue Lasix IV daily as she is reporting improvement while on this tx.  strict Is and Os  daily weights   Bipolar disorder Type 1 Mood has been stable Continue Lamictal, Abilify, Klonopin PRN Continue Lithium   Sinus tachycardia continue Toprol   Hypertension continue amlodipine and Toprol   OSA/Suspect hypoventilation syndrome with morbid obesity  Has not yet been fitted for CPAP outpatient- will need follow up  outpatient  DVT prophylaxis:.Lovenox Code Status: Full Family Communication: Plan discussed with patient at bedside  disposition Plan: Admit to IP  Consultants:   Cardiology  Procedures: None Antimicrobials: None  Subjective: Patient reports that her shortness of breath has improved some but she still has dyspnea on exertion. Denies fever, chills, or productive cough.   Objective: Vitals:   08/08/19 0510 08/08/19 0630 08/08/19 0729 08/08/19 1345  BP:   138/86 122/73  Pulse:   75 81  Resp: (!) 28 (!) 21 (!) 30 (!) 30  Temp:   98.1 F (36.7 C) 98.5 F (36.9 C)  TempSrc:      SpO2:   96% 95%  Weight:      Height:       No intake or output data in the 24 hours ending 08/08/19 1556 Filed Weights   08/07/19 1622 08/08/19 0504  Weight: (!) 152 kg (!) 160.4 kg    Examination:  General exam: Appears calm and comfortable  Respiratory system: No wheezing, no respiratory distress Cardiovascular system: S1 & S2, trace pitting edema bl LE Gastrointestinal system: Abdomen is nondistended, soft and nontender.  Central nervous system: Alert and oriented. No focal neurological deficits. Extremities: Symmetric 5 x 5 power. Skin: No rashes, lesions or ulcers Psychiatry:  Mood & affect appropriate.     Data Reviewed: I have personally reviewed following labs and imaging studies  CBC: Recent Labs  Lab 08/07/19 1424 08/08/19 0641  WBC 9.3 8.3  NEUTROABS 6.7  --   HGB 11.6* 11.4*  HCT 37.7  37.5  MCV 97.4 98.7  PLT 285 0000000   Basic Metabolic Panel: Recent Labs  Lab 08/07/19 1424 08/08/19 0641  NA 140 139  K 3.7 3.8  CL 105 105  CO2 25 26  GLUCOSE 96 104*  BUN 10 10  CREATININE 0.78 0.79  CALCIUM 9.4 9.4   GFR: Estimated Creatinine Clearance: 140.3 mL/min (by C-G formula based on SCr of 0.79 mg/dL). Liver Function Tests: Recent Labs  Lab 08/07/19 1424  AST 16  ALT 26  ALKPHOS 53  BILITOT 0.6  PROT 7.5  ALBUMIN 4.2   No results for input(s): LIPASE,  AMYLASE in the last 168 hours. No results for input(s): AMMONIA in the last 168 hours. Coagulation Profile: No results for input(s): INR, PROTIME in the last 168 hours. Cardiac Enzymes: No results for input(s): CKTOTAL, CKMB, CKMBINDEX, TROPONINI in the last 168 hours. BNP (last 3 results) No results for input(s): PROBNP in the last 8760 hours. HbA1C: No results for input(s): HGBA1C in the last 72 hours. CBG: No results for input(s): GLUCAP in the last 168 hours. Lipid Profile: No results for input(s): CHOL, HDL, LDLCALC, TRIG, CHOLHDL, LDLDIRECT in the last 72 hours. Thyroid Function Tests: Recent Labs    08/08/19 0641  TSH 1.215   Anemia Panel: No results for input(s): VITAMINB12, FOLATE, FERRITIN, TIBC, IRON, RETICCTPCT in the last 72 hours. Sepsis Labs: No results for input(s): PROCALCITON, LATICACIDVEN in the last 168 hours.  Recent Results (from the past 240 hour(s))  SARS CORONAVIRUS 2 (TAT 6-24 HRS) Nasopharyngeal Nasopharyngeal Swab     Status: None   Collection Time: 08/07/19  4:18 PM   Specimen: Nasopharyngeal Swab  Result Value Ref Range Status   SARS Coronavirus 2 NEGATIVE NEGATIVE Final    Comment: (NOTE) SARS-CoV-2 target nucleic acids are NOT DETECTED. The SARS-CoV-2 RNA is generally detectable in upper and lower respiratory specimens during the acute phase of infection. Negative results do not preclude SARS-CoV-2 infection, do not rule out co-infections with other pathogens, and should not be used as the sole basis for treatment or other patient management decisions. Negative results must be combined with clinical observations, patient history, and epidemiological information. The expected result is Negative. Fact Sheet for Patients: SugarRoll.be Fact Sheet for Healthcare Providers: https://www.woods-mathews.com/ This test is not yet approved or cleared by the [place] FDA and  has been authorized for  detection and/or diagnosis of SARS-CoV-2 by FDA under an Emergency Use Authorization (EUA). This EUA will remain  in effect (meaning this test can be used) for the duration of the COVID-19 declaration under Section 56 4(b)(1) of the Act, 21 U.S.C. section 360bbb-3(b)(1), unless the authorization is terminated or revoked sooner. Performed at [facility], [place] [address], [place], [place] [address]          Radiology Studies: CT Angio Chest PE W/Cm &/Or Wo Cm  Result Date: 08/07/2019 CLINICAL DATA:  Shortness of breath EXAM: CT ANGIOGRAPHY CHEST WITH CONTRAST TECHNIQUE: Multidetector CT imaging of the chest was performed using the standard protocol during bolus administration of intravenous contrast. Multiplanar CT image reconstructions and MIPs were obtained to evaluate the vascular anatomy. CONTRAST:  170mL OMNIPAQUE IOHEXOL 350 MG/ML SOLN COMPARISON:  01/30/2019 FINDINGS: Cardiovascular: Examination is limited secondary to suboptimal contrast bolus timing, respiratory motion, and beam hardening artifact related to patient body habitus. There is no evidence of central, main, or lobar branch filling defect to suggest pulmonary embolism. The segmental and subsegmental branch arteries are suboptimally evaluated. Prominence  of the pulmonary vasculature suggesting venous congestion. Heart size is normal. Thoracic aorta is nonaneurysmal. Mediastinum/Nodes: No enlarged mediastinal, hilar, or axillary lymph nodes. Thyroid gland, trachea, and esophagus demonstrate no significant findings. Lungs/Pleura: No focal airspace consolidation, pleural effusion, or pneumothorax. Upper Abdomen: No acute abnormality. Musculoskeletal: No chest wall abnormality. No acute or significant osseous findings. Review of the MIP images confirms the above findings. IMPRESSION: 1. Slightly limited exam. There is no evidence of central, main, or lobar branch filling defect to suggest pulmonary embolism. 2. Findings suggesting  pulmonary vascular congestion. No airspace consolidation or overt pulmonary edema. Electronically Signed   By: Davina Poke D.O.   On: 08/07/2019 17:52   DG Chest Portable 1 View  Result Date: 08/07/2019 CLINICAL DATA:  Pt presents with c/o cough, shortness of breath, and wheezing since Wednesday. Medical hx of HTN. EXAM: PORTABLE CHEST 1 VIEW COMPARISON:  Chest radiograph 01/30/2019 FINDINGS: Stable cardiomediastinal contours with enlarged heart size. Central vascular congestion without overt edema. No new focal infiltrate. No pneumothorax or large pleural effusion. No acute finding in the visualized skeleton. IMPRESSION: Cardiomegaly with central vascular congestion, no overt edema or active infection. Electronically Signed   By: Audie Pinto M.D.   On: 08/07/2019 13:01        Scheduled Meds:  amLODipine  10 mg Oral Daily   ARIPiprazole  15 mg Oral Daily   enoxaparin (LOVENOX) injection  60 mg Subcutaneous QHS   furosemide  20 mg Intravenous Daily   lamoTRIgine  150 mg Oral BID   lithium carbonate  600 mg Oral Daily   lithium carbonate  900 mg Oral QHS   metoprolol succinate  100 mg Oral Daily   Continuous Infusions:   LOS: 0 days    Time spent: 27 minutes    Blain Pais, MD [facility]   If 7PM-7AM, please contact night-coverage [email or web address] Password [facility] 08/08/2019, 3:56 PM

## 2019-08-09 ENCOUNTER — Inpatient Hospital Stay (HOSPITAL_COMMUNITY): Payer: Medicare Other

## 2019-08-09 ENCOUNTER — Telehealth (INDEPENDENT_AMBULATORY_CARE_PROVIDER_SITE_OTHER): Payer: BC Managed Care – PPO | Admitting: Family Medicine

## 2019-08-09 ENCOUNTER — Telehealth: Payer: Self-pay | Admitting: Cardiology

## 2019-08-09 DIAGNOSIS — I5031 Acute diastolic (congestive) heart failure: Secondary | ICD-10-CM

## 2019-08-09 DIAGNOSIS — R0602 Shortness of breath: Secondary | ICD-10-CM

## 2019-08-09 DIAGNOSIS — I10 Essential (primary) hypertension: Secondary | ICD-10-CM

## 2019-08-09 DIAGNOSIS — J9601 Acute respiratory failure with hypoxia: Secondary | ICD-10-CM

## 2019-08-09 DIAGNOSIS — J810 Acute pulmonary edema: Secondary | ICD-10-CM | POA: Diagnosis not present

## 2019-08-09 DIAGNOSIS — G4733 Obstructive sleep apnea (adult) (pediatric): Secondary | ICD-10-CM | POA: Diagnosis not present

## 2019-08-09 DIAGNOSIS — Z6841 Body Mass Index (BMI) 40.0 and over, adult: Secondary | ICD-10-CM | POA: Diagnosis not present

## 2019-08-09 DIAGNOSIS — E662 Morbid (severe) obesity with alveolar hypoventilation: Secondary | ICD-10-CM | POA: Diagnosis not present

## 2019-08-09 LAB — ECHOCARDIOGRAM COMPLETE
Height: 64 in
Weight: 5573.23 oz

## 2019-08-09 LAB — CBC
HCT: 37.8 % (ref 36.0–46.0)
Hemoglobin: 11.3 g/dL — ABNORMAL LOW (ref 12.0–15.0)
MCH: 29.7 pg (ref 26.0–34.0)
MCHC: 29.9 g/dL — ABNORMAL LOW (ref 30.0–36.0)
MCV: 99.5 fL (ref 80.0–100.0)
Platelets: 286 10*3/uL (ref 150–400)
RBC: 3.8 MIL/uL — ABNORMAL LOW (ref 3.87–5.11)
RDW: 15.9 % — ABNORMAL HIGH (ref 11.5–15.5)
WBC: 9.3 10*3/uL (ref 4.0–10.5)
nRBC: 0 % (ref 0.0–0.2)

## 2019-08-09 LAB — BASIC METABOLIC PANEL
Anion gap: 7 (ref 5–15)
BUN: 12 mg/dL (ref 6–20)
CO2: 29 mmol/L (ref 22–32)
Calcium: 9.5 mg/dL (ref 8.9–10.3)
Chloride: 102 mmol/L (ref 98–111)
Creatinine, Ser: 0.93 mg/dL (ref 0.44–1.00)
GFR calc Af Amer: >60 mL/min (ref >60)
GFR calc non Af Amer: >60 mL/min (ref >60)
Glucose, Bld: 109 mg/dL — ABNORMAL HIGH (ref 70–99)
Potassium: 3.9 mmol/L (ref 3.5–5.1)
Sodium: 138 mmol/L (ref 135–145)

## 2019-08-09 MED ORDER — ENOXAPARIN SODIUM 80 MG/0.8ML ~~LOC~~ SOLN
80.0000 mg | Freq: Every day | SUBCUTANEOUS | Status: DC
  Administered 2019-08-09: 80 mg via SUBCUTANEOUS
  Filled 2019-08-09: qty 0.8

## 2019-08-09 MED ORDER — AMLODIPINE BESYLATE 5 MG PO TABS
5.0000 mg | ORAL_TABLET | Freq: Every day | ORAL | Status: DC
  Administered 2019-08-10: 09:00:00 5 mg via ORAL
  Filled 2019-08-09: qty 1

## 2019-08-09 MED ORDER — ONDANSETRON 4 MG PO TBDP
4.0000 mg | ORAL_TABLET | Freq: Three times a day (TID) | ORAL | Status: DC | PRN
  Administered 2019-08-10: 4 mg via ORAL
  Filled 2019-08-09: qty 1

## 2019-08-09 MED ORDER — HYDROCHLOROTHIAZIDE 12.5 MG PO CAPS
12.5000 mg | ORAL_CAPSULE | Freq: Every day | ORAL | Status: DC
  Administered 2019-08-10: 09:00:00 12.5 mg via ORAL
  Filled 2019-08-09: qty 1

## 2019-08-09 MED ORDER — FUROSEMIDE 10 MG/ML IJ SOLN
40.0000 mg | Freq: Once | INTRAMUSCULAR | Status: AC
  Administered 2019-08-09: 40 mg via INTRAVENOUS
  Filled 2019-08-09: qty 4

## 2019-08-09 NOTE — Telephone Encounter (Signed)
This has been addressed by hospital staff .Adonis Housekeeper

## 2019-08-09 NOTE — Consult Note (Addendum)
Cardiology Consultation:   Patient ID: Christine [name] MRN: [id number]; DOB: 1976/06/27  Admit date: 08/07/2019 Date of Consult: 08/09/2019  Primary Care Provider: Maude Leriche, PA-C Primary Cardiologist: Glenetta Hew, MD  Primary Electrophysiologist:  None    Patient Profile:   Christine [name] is a 44 y.o. female with a hx of morbid obesity, hypertension, PCOS, bipolar disorder, OSA and suggested pulmonary hypertension CT who is being seen today for the evaluation of CHF at the request of Dr. Hayes Ludwig.  History of Present Illness:   Ms. Worst has a past medical history as above.  She is followed in our office by Dr. Ellyn Hack and was last seen on 07/07/2019 via virtual visit for follow-up of exertional dyspnea.  It was noted that recent echocardiogram on 06/10/2019 showed EF 65-70%, normal RV size and function, normal biatrial size, normal tricuspid regurgitation, suggesting normal pulmonary pressures (but unable to assess due to poor TR signal).  Right atrial pressure estimated at 8 mmHg.  A coronary CTA was done and showed a calcium score of 0 with no evidence of CAD.  PA was dilated at 33 mm, suggesting some pulmonary hypertension.   Heart rates were reportedly improved in the 70s with Toprol.  Also blood pressure was considered improved on Toprol.  She never did get a prescription for losartan as previously prescribed.  Dr. Ellyn Hack recommended to consider low-dose diuretic if additional blood pressure control was required.   It was noted that the patient has been diagnosed with obstructive sleep apnea and is due to undergo assessment for CPAP titration.  OSA is suspected to be contributing somewhat to her dyspnea and hypertension. Weight loss was considered her best option for long-term health.  The patient presented to the hospital on 08/07/2019 with complaints of increased shortness of breath and lower extremity edema.  Troponins were flat.  CTA of the chest was negative for PE or  consolidation.  Covid test was negative.  BNP was 16.1.  Chest x-ray showed cardiomegaly and central vascular congestion.  The patient tells me that she started coughing last Wednesday and developed some shortness of breath and some chest pain.  On Saturday her shortness of breath became worse.  She was suspecting that she might have Covid 19.  She did not have any fever.  She also noted that she developed some ankle edema over the last couple weeks.  Unclear whether she has orthopnea.  She states that she has been sleeping poorly, only a couple of hours at a time.  The patient notes that her urine output was not being measured for the first 2 days.  She says that she started measuring it last night.  She did note increased urine output at first from the Lasix.  Not so much last night and today.  She says that her breathing has been better and she is no longer having chest pain.  Her ankle edema is better.  She is still wearing oxygen.  The patient does admit to probably having increased salt intake in December related to the holidays.  She notes that she was supposed to have CPAP titration but she canceled her appointment and does not recall the reason.  She says that with her bipolar it has been hard for her, staying home due to Covid restrictions.   Heart Pathway Score:     Past Medical History:  Diagnosis Date  . Anemia    Presumably from menorrhagia/menomenorrhagia; has had both blood and iron transfusions  . Anxiety   .  Back pain   . Bipolar 1 disorder ([place])    With depression  . Depression   . Essential hypertension   . Fibromyalgia   . Headache   . History of blood transfusion 08/2017   [facility]  . History of hiatal hernia   . Infertility, female   . Lactose intolerance   . Lower extremity edema   . Migraines   . Morbid obesity ([place])   . Osteoarthritis    Hands, ankles; HLA-B27 positive  . PCOS (polycystic ovarian syndrome)   . Seasonal allergies    With recurrent allergic  rhinitis  . Uterine leiomyoma   . Vitamin D deficiency     Past Surgical History:  Procedure Laterality Date  . CARDIAC CT ANGIOGRAM  03/2019    Ca Score 0. Normal Coronary origin R dominant. No evidence of CAD. ? Liver nodules - poorly visualized (consider MRI w & w/o Gad contrast)  . CERVICAL POLYPECTOMY  01/15/2018   Procedure: CERVICAL POLYPECTOMY;  Surgeon: Arvella Nigh, MD;  Location: [facility];  Service: Gynecology;;  . COLONOSCOPY  2008   Normal  . DIAGNOSTIC LAPAROSCOPY  08/1999   dermoid cyst, [facility]  . DILATION AND CURETTAGE OF UTERUS  05/2004   [facility]  . DILATION AND CURETTAGE OF UTERUS  04/2015  . HYSTEROSCOPY WITH D & C  07/22/2011   Procedure: DILATATION AND CURETTAGE /HYSTEROSCOPY;  Surgeon: Cyril Mourning, MD;  Location: [facility];  Service: Gynecology;;  . HYSTEROSCOPY WITH D & C N/A 01/15/2018   Procedure: DILATATION AND CURETTAGE [place] WITH MYOSURE;  Surgeon: Arvella Nigh, MD;  Location: [facility];  Service: Gynecology;  Laterality: N/A;  . 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 TRANSTHORACIC ECHOCARDIOGRAM  08/2017    EF 65 to 70% with vigorous wall motion.  Suggestion of high cardiac output (possibly related to anemia thyrotoxicosis, Pregnancy, sepsis etc.) -- was in setting of symptomatic anemia - Hgb 5.7     Home Medications:  Prior to Admission medications   Medication Sig Start Date End Date Taking? Authorizing Provider  acetaminophen (TYLENOL) 325 MG tablet Take 650 mg by mouth every 6 (six) hours as needed for mild pain or headache.   Yes [provider]  amLODipine (NORVASC) 10 MG tablet Take 1 tablet (10 mg total) by mouth daily. 08/29/17  Yes Eugenie Filler, MD  ARIPiprazole (ABILIFY) 15 MG tablet Take 1 tablet (15 mg total) by mouth daily. 07/22/19 07/21/20 Yes [name], [name], MD  clonazePAM (KLONOPIN) 0.5 MG tablet Take 1 tablet (0.5 mg total) by mouth daily as needed for anxiety. 07/22/19  Yes [name], [name], MD  fluticasone (FLONASE) 50  MCG/ACT nasal spray Place into both nostrils daily as needed for allergies or rhinitis.   Yes [provider]  lamoTRIgine (LAMICTAL) 150 MG tablet Take 1 tablet (150 mg total) by mouth 2 (two) times daily. For mood 07/22/19  Yes [name], [name], MD  lithium carbonate 300 MG capsule Take 2 capsule in AM and 3 capsule at bed time Patient taking differently: Take 600-900 mg by mouth See admin instructions. Take 2 capsules in AM and 3 capsulse at bed time 07/22/19  Yes [name], [name], MD  metFORMIN (GLUCOPHAGE) 500 MG tablet Take 1 tablet (500 mg total) by mouth 2 (two) times daily with a meal. 04/14/19  Yes Leafy Ro, Caren D, MD  metoprolol succinate (TOPROL-XL) 100 MG 24 hr tablet Take 1 tablet (100 mg total) by mouth daily. Take  with or immediately following a meal. 05/14/19 08/12/19 Yes [name], MD  Vitamin D, Ergocalciferol, (DRISDOL) 1.25 MG (50000 UT) CAPS capsule Take 1 capsule (50,000 Units total) by mouth every 7 (seven) days. 04/14/19  Yes Beasley, Caren D, MD  Erenumab-aooe (AIMOVIG) 140 MG/ML SOAJ Inject 140 mg into the skin every 30 (thirty) days. Patient not taking: Reported on 08/07/2019 09/21/18   Melvenia Beam, MD    Inpatient Medications: Scheduled Meds: . amLODipine  10 mg Oral Daily  . ARIPiprazole  15 mg Oral Daily  . enoxaparin (LOVENOX) injection  80 mg Subcutaneous QHS  . furosemide  20 mg Intravenous Daily  . lamoTRIgine  150 mg Oral BID  . lithium carbonate  600 mg Oral Daily  . lithium carbonate  900 mg Oral QHS  . metoprolol succinate  100 mg Oral Daily   Continuous Infusions:  PRN Meds: acetaminophen, clonazePAM  Allergies:    Allergies  Allergen Reactions  . Penicillins Rash and Other (See Comments)    Has patient had a PCN reaction causing immediate rash, facial/tongue/throat swelling, SOB or lightheadedness with hypotension: Yes Has patient had a PCN reaction causing severe rash involving mucus membranes or skin necrosis: Unknown Has patient had  a PCN reaction that required hospitalization: No Has patient had a PCN reaction occurring within the last 10 years: No If all of the above answers are "NO", then may proceed with Cephalosporin use.     Social History:   Social History   Socioeconomic History  . Marital status: Married    Spouse name: Glynis Hunsucker  . Number of children: 2  . Years of education: 2 years of grad school + bach & HS  . Highest education level: Bachelor's degree (e.g., BA, AB, BS)  Occupational History  . Occupation: disabled  Tobacco Use  . Smoking status: Never Smoker  . Smokeless tobacco: Never Used  Substance and Sexual Activity  . Alcohol use: Yes    Comment: occasional, maybe once a month or so  . Drug use: No  . Sexual activity: Yes    Partners: Male    Birth control/protection: None  Other Topics Concern  . Not on file  Social History Narrative   Lives at home with her husband and 2 adopted daughters.   Right handed   Caffeine: decreased intake, drinks rarely    Is on long-term disability.   Social Determinants of Health   Financial Resource Strain:   . Difficulty of Paying Living Expenses: Not on file  Food Insecurity:   . Worried About Charity fundraiser in the Last Year: Not on file  . Ran Out of Food in the Last Year: Not on file  Transportation Needs:   . Lack of Transportation (Medical): Not on file  . Lack of Transportation (Non-Medical): Not on file  Physical Activity:   . Days of Exercise per Week: Not on file  . Minutes of Exercise per Session: Not on file  Stress:   . Feeling of Stress : Not on file  Social Connections:   . Frequency of Communication with Friends and Family: Not on file  . Frequency of Social Gatherings with Friends and Family: Not on file  . Attends Religious Services: Not on file  . Active Member of Clubs or Organizations: Not on file  . Attends Archivist Meetings: Not on file  . Marital Status: Not on file  Intimate Partner  Violence:   . Fear of Current or  Ex-Partner: Not on file  . Emotionally Abused: Not on file  . Physically Abused: Not on file  . Sexually Abused: Not on file    Family History:    Family History  Problem Relation Age of Onset  . Anxiety disorder Mother   . Depression Mother   . Cancer Mother        pancreatic   . High blood pressure Mother   . Hypertension Mother   . Hyperlipidemia Mother   . Cancer Father        colon  . High blood pressure Father   . Diabetes Father   . Hypertension Father   . Hyperlipidemia Father   . Heart disease Father   . Stroke Father   . Kidney disease Father   . Sleep apnea Father   . Obesity Father   . Cancer Brother 6       leukemia, childhood  . High blood pressure Brother   . Migraines Neg Hx      ROS:  Please see the history of present illness.   All other ROS reviewed and negative.     Physical Exam/Data:   Vitals:   08/09/19 0548 08/09/19 0742 08/09/19 1012 08/09/19 1408  BP: 123/78 124/67 127/71 128/67  Pulse: 77 80 90 81  Resp: (!) 28 20 (!) 22 18  Temp: (!) 97.5 F (36.4 C) 98 F (36.7 C) 98.4 F (36.9 C) 98.1 F (36.7 C)  TempSrc: Oral     SpO2: 97% 97% 92% 100%  Weight:      Height:        Intake/Output Summary (Last 24 hours) at 08/09/2019 1508 Last data filed at 08/09/2019 1452 Gross per 24 hour  Intake --  Output 750 ml  Net -750 ml   Last 3 Weights 08/09/2019 08/08/2019 08/07/2019  Weight (lbs) 348 lb 5.2 oz 353 lb 9.9 oz 335 lb  Weight (kg) 158 kg 160.4 kg 151.955 kg  Some encounter information is confidential and restricted. Go to Review Flowsheets activity to see all data.     Body mass index is 59.79 kg/m.  General: Morbidly obese female, in no acute distress HEENT: normal Lymph: no adenopathy Neck: no JVD Endocrine:  No thryomegaly Vascular: No carotid bruits; FA pulses 2+ bilaterally without bruits  Cardiac:  normal S1, S2; RRR; no murmur  Lungs:  clear to auscultation bilaterally, no wheezing,  rhonchi or rales  Abd: soft, nontender, no hepatomegaly  Ext: no edema Musculoskeletal:  No deformities, BUE and BLE strength normal and equal Skin: warm and dry  Neuro:  CNs 2-12 intact, no focal abnormalities noted Psych:  Normal affect   EKG:  The EKG was personally reviewed and demonstrates: Sinus rhythm with PAC, 97 bpm.  No ST/T changes. Telemetry:  Telemetry was personally reviewed and demonstrates: Sinus rhythm 70s-80s with occasional PVCs  Relevant CV Studies:  Echocardiogram 08/09/2019 IMPRESSIONS   1. Left ventricular ejection fraction, by visual estimation, is 60 to 65%. The left ventricle has normal function. There is no left ventricular hypertrophy.  2. The left ventricle has no regional wall motion abnormalities.  3. Global right ventricle has normal systolic function.The right ventricular size is normal. No increase in right ventricular wall thickness.  4. Left atrial size was normal.  5. Right atrial size was normal.  6. The mitral valve is normal in structure. No evidence of mitral valve regurgitation.  7. The tricuspid valve is normal in structure.  8. The aortic valve is normal  in structure. Aortic valve regurgitation is not visualized.  9. The pulmonic valve was normal in structure. Pulmonic valve regurgitation is not visualized. 10. The atrial septum is grossly normal.  Laboratory Data:  High Sensitivity Troponin:   Recent Labs  Lab 08/07/19 1424 08/07/19 1618  TROPONINIHS 2 2     Chemistry Recent Labs  Lab 08/07/19 1424 08/08/19 0641 08/09/19 0542  NA 140 139 138  K 3.7 3.8 3.9  CL 105 105 102  CO2 '25 26 29  '$ GLUCOSE 96 104* 109*  BUN '10 10 12  '$ CREATININE 0.78 0.79 0.93  CALCIUM 9.4 9.4 9.5  GFRNONAA >60 >60 >60  GFRAA >60 >60 >60  ANIONGAP '10 8 7    '$ Recent Labs  Lab 08/07/19 1424  PROT 7.5  ALBUMIN 4.2  AST 16  ALT 26  ALKPHOS 53  BILITOT 0.6   Hematology Recent Labs  Lab 08/07/19 1424 08/08/19 0641 08/09/19 0542  WBC 9.3  8.3 9.3  RBC 3.87 3.80* 3.80*  HGB 11.6* 11.4* 11.3*  HCT 37.7 37.5 37.8  MCV 97.4 98.7 99.5  MCH 30.0 30.0 29.7  MCHC 30.8 30.4 29.9*  RDW 15.3 15.9* 15.9*  PLT 285 296 286   BNP Recent Labs  Lab 08/07/19 1424  BNP 16.1    DDimer  Recent Labs  Lab 08/07/19 1424  DDIMER 0.50     Radiology/Studies:  CT Angio Chest PE W/Cm &/Or Wo Cm  Result Date: 08/07/2019 CLINICAL DATA:  Shortness of breath EXAM: CT ANGIOGRAPHY CHEST WITH CONTRAST TECHNIQUE: Multidetector CT imaging of the chest was performed using the standard protocol during bolus administration of intravenous contrast. Multiplanar CT image reconstructions and MIPs were obtained to evaluate the vascular anatomy. CONTRAST:  126m OMNIPAQUE IOHEXOL 350 MG/ML SOLN COMPARISON:  01/30/2019 FINDINGS: Cardiovascular: Examination is limited secondary to suboptimal contrast bolus timing, respiratory motion, and beam hardening artifact related to patient body habitus. There is no evidence of central, main, or lobar branch filling defect to suggest pulmonary embolism. The segmental and subsegmental branch arteries are suboptimally evaluated. Prominence of the pulmonary vasculature suggesting venous congestion. Heart size is normal. Thoracic aorta is nonaneurysmal. Mediastinum/Nodes: No enlarged mediastinal, hilar, or axillary lymph nodes. Thyroid gland, trachea, and esophagus demonstrate no significant findings. Lungs/Pleura: No focal airspace consolidation, pleural effusion, or pneumothorax. Upper Abdomen: No acute abnormality. Musculoskeletal: No chest wall abnormality. No acute or significant osseous findings. Review of the MIP images confirms the above findings. IMPRESSION: 1. Slightly limited exam. There is no evidence of central, main, or lobar branch filling defect to suggest pulmonary embolism. 2. Findings suggesting pulmonary vascular congestion. No airspace consolidation or overt pulmonary edema. Electronically Signed   By: NDavina PokeD.O.   On: 08/07/2019 17:52   DG Chest Portable 1 View  Result Date: 08/07/2019 CLINICAL DATA:  Pt presents with c/o cough, shortness of breath, and wheezing since Wednesday. Medical hx of HTN. EXAM: PORTABLE CHEST 1 VIEW COMPARISON:  Chest radiograph 01/30/2019 FINDINGS: Stable cardiomediastinal contours with enlarged heart size. Central vascular congestion without overt edema. No new focal infiltrate. No pneumothorax or large pleural effusion. No acute finding in the visualized skeleton. IMPRESSION: Cardiomegaly with central vascular congestion, no overt edema or active infection. Electronically Signed   By: NAudie PintoM.D.   On: 08/07/2019 13:01   ECHOCARDIOGRAM COMPLETE  Result Date: 08/09/2019   ECHOCARDIOGRAM REPORT   Patient Name:   Christine INGRAMDate of Exam: 08/09/2019 Medical Rec #:  [id number]  Height:       64.0 in Accession #:    [id number]      Weight:       348.3 lb Date of Birth:  09/21/1975      BSA:          2.48 m Patient Age:    43 years        BP:           127/71 mmHg Patient Gender: F               HR:           90 bpm. Exam Location:  Inpatient Procedure: 2D Echo Indications:    Dyspnea R.06.00  History:        Patient has prior history of Echocardiogram examinations, most                 recent 06/10/2019. Risk Factors:Hypertension.  Sonographer:    Mikki Santee RDCS (AE) Referring Phys: [place]  1. Left ventricular ejection fraction, by visual estimation, is 60 to 65%. The left ventricle has normal function. There is no left ventricular hypertrophy.  2. The left ventricle has no regional wall motion abnormalities.  3. Global right ventricle has normal systolic function.The right ventricular size is normal. No increase in right ventricular wall thickness.  4. Left atrial size was normal.  5. Right atrial size was normal.  6. The mitral valve is normal in structure. No evidence of mitral valve regurgitation.  7. The tricuspid valve is  normal in structure.  8. The aortic valve is normal in structure. Aortic valve regurgitation is not visualized.  9. The pulmonic valve was normal in structure. Pulmonic valve regurgitation is not visualized. 10. The atrial septum is grossly normal. FINDINGS  Left Ventricle: Left ventricular ejection fraction, by visual estimation, is 60 to 65%. The left ventricle has normal function. The left ventricle has no regional wall motion abnormalities. There is no left ventricular hypertrophy. Right Ventricle: The right ventricular size is normal. No increase in right ventricular wall thickness. Global RV systolic function is has normal systolic function. Left Atrium: Left atrial size was normal in size. Right Atrium: Right atrial size was normal in size Pericardium: There is no evidence of pericardial effusion. Mitral Valve: The mitral valve is normal in structure. No evidence of mitral valve regurgitation. Tricuspid Valve: The tricuspid valve is normal in structure. Tricuspid valve regurgitation is trivial. Aortic Valve: The aortic valve is normal in structure. Aortic valve regurgitation is not visualized. Pulmonic Valve: The pulmonic valve was normal in structure. Pulmonic valve regurgitation is not visualized. Pulmonic regurgitation is not visualized. Aorta: The aortic root and ascending aorta are structurally normal, with no evidence of dilitation. IAS/Shunts: The atrial septum is grossly normal.  LEFT VENTRICLE PLAX 2D LVIDd:         5.00 cm  Diastology LVIDs:         3.32 cm  LV e' lateral:   12.70 cm/s LV PW:         1.02 cm  LV E/e' lateral: 8.2 LV IVS:        0.99 cm  LV e' medial:    9.90 cm/s LVOT diam:     2.00 cm  LV E/e' medial:  10.5 LV SV:         73 ml LV SV Index:   26.43 LVOT Area:     3.14 cm  RIGHT VENTRICLE RV S prime:  12.70 cm/s TAPSE (M-mode): 1.8 cm LEFT ATRIUM             Index       RIGHT ATRIUM           Index LA diam:        4.20 cm 1.70 cm/m  RA Area:     14.30 cm LA Vol (A2C):   37.7  ml 15.22 ml/m RA Volume:   36.80 ml  14.86 ml/m LA Vol (A4C):   38.1 ml 15.38 ml/m LA Biplane Vol: 40.8 ml 16.47 ml/m  AORTIC VALVE LVOT Vmax:   120.00 cm/s LVOT Vmean:  82.500 cm/s LVOT VTI:    0.199 m  AORTA Ao Root diam: 3.00 cm MITRAL VALVE MV Area (PHT): 2.95 cm              SHUNTS MV PHT:        74.53 msec            Systemic VTI:  0.20 m MV Decel Time: 257 msec              Systemic Diam: 2.00 cm MV E velocity: 104.00 cm/s 103 cm/s MV A velocity: 90.20 cm/s  70.3 cm/s MV E/A ratio:  1.15        1.5  Mertie Moores MD Electronically signed by Mertie Moores MD Signature Date/Time: 08/09/2019/1:50:32 PM    Final          Assessment and Plan:   Dyspnea on exertion -Patient was recently evaluated by Dr. Ellyn Hack in the outpatient setting.  Echocardiogram showed normal LV systolic function and normal PA pressures on echocardiogram.  RV size was relatively normal.  Pulmonary artery was dilated on CT scans so there was felt to be possibly some component of pulmonary hypertension.  She had no clear cardiac etiology for her shortness of breath.  It was felt that obesity and obesity hypoventilation with sleep apnea were likely contributing most to her symptoms. -Coronary CTA showed calcium score of 0 and normal coronary arteries. -Echocardiogram done today shows normal LV systolic function with EF 60-65%, no LVH, no regional wall motion abnormalities, normal RV function -Troponins were low and flat.  CTA of the chest was negative for PE or consolidation but did suggest moderate vascular congestion, no overt pulmonary edema.  Covid test was negative.  BNP was 16.1, possibly under represented due to obesity.  Chest x-ray showed cardiomegaly and central vascular congestion, no overt edema.  TSH in normal limits -Patient is being diuresed with Lasix IV grams daily. Only minimal output documented, but patient states that she had good urine output initially. -Weight has been variable, question accuracy.   -Symptoms have improved although she is still on oxygen. Will give an extra dose of lasix today to see if can wean off of oxygen. As soon as she is able to be on room air she can be discharged.  -She would likely benefit from an outpatient diuretic. Will add HCTZ.  -Advised on Low sodium diet.   Hypertension -On home amlodipine and Toprol -Blood pressure well controlled. -Will decrease amlodipine from 10 mg to 5 mg daily and add hydrochlorothiazide 12.5 mg daily.   OSA -Patient found to have severe sleep apnea by sleep study in 03/2019.  She still needs CPAP titration.  Patient advised to follow-up on rescheduling her CPAP titration.  Sinus tachycardia -Patient was started on beta-blocker as an outpatient with improvement. -Heart rates currently in the 70s-80s.  Obesity -Body mass  index is 59.79 kg/m. -Patient had been going to healthy weight and wellness but with the pandemic she has not been back recently.  We discussed getting back into it and working on weight loss for her future health.     For questions or updates, please contact [facility] Please consult [email or web address] for contact info under   Signed, Daune Perch, NP  08/09/2019 3:08 PM

## 2019-08-09 NOTE — Progress Notes (Addendum)
PROGRESS NOTE    Christine [name]  [id number] DOB: 02-03-1976 DOA: 08/07/2019 PCP: Maude Leriche, PA-C   Brief Narrative: Patient admitted from home with increase in shortness of breath and lower extremity edema. Had extensive cardiac work up recently that was negative for CHF or pulmonary HTN. Has OSA, not wearing CPAP yet at home, awaiting to have fitting to this.  Troponin flat, CTA negative for PE or consolidation. COVID negative. Afebrile. BNP of 16.1. CXR showed cardiomegaly and central vascular congestion.  Reports possible recent increase in sodium intake.    Assessment & Plan:   Active Problems:   Bipolar disorder (HCC)   HTN (hypertension)   Obesity hypoventilation syndrome (HCC)   OSA (obstructive sleep apnea)   Sinus tachycardia   Pulmonary edema   Acute diastolic heart failure (HCC)   Pulmonary edema causing acute respiratory failure with hypoxia CTA limited but no PE, normal heart size. BNP was low although pt is morbidly obese so likely inaccurate. TSH wnl.  Unsure if this is due to heart failure because she recent had negative coronary CTA. She was diagnosed with OSA not yet on CPAP but echo without pulmonary HTN in 06/2019.  Consult to Cardiology placed. Patient started on HCTZ.   strict Is and Os  Repeat labs in am Wean off O2 supplementation as tolerated. Patient currently on 2L/min [place] O2 supplementation--D/w patient's nurse  Bipolar disorder Type 1 Mood has been stable Continue Lamictal, Abilify, Klonopin PRN Continue Lithium   Sinus tachycardia continue Toprol   Hypertension continue amlodipine at reduced dose per Cardiology, continue Toprol   OSA/Suspect hypoventilation syndrome with morbid obesity  Has not yet been fitted for CPAP outpatient- will need follow up outpatient  DVT prophylaxis:.Lovenox Code Status: Full Family Communication: Plan discussed with patient at bedside  disposition Plan: Dc home in 1-2 days, once weaned off O2  supplementation   Consultants:   Cardiology  Procedures: None Antimicrobials: None  Subjective: She denies cough, fever, or chills. Continues to have occasional dyspnea.   Objective: Vitals:   08/09/19 0548 08/09/19 0742 08/09/19 1012 08/09/19 1408  BP: 123/78 124/67 127/71 128/67  Pulse: 77 80 90 81  Resp: (!) 28 20 (!) 22 18  Temp: (!) 97.5 F (36.4 C) 98 F (36.7 C) 98.4 F (36.9 C) 98.1 F (36.7 C)  TempSrc: Oral     SpO2: 97% 97% 92% 100%  Weight:      Height:        Intake/Output Summary (Last 24 hours) at 08/09/2019 1806 Last data filed at 08/09/2019 1452 Gross per 24 hour  Intake --  Output 750 ml  Net -750 ml   Filed Weights   08/07/19 1622 08/08/19 0504 08/09/19 0500  Weight: (!) 152 kg (!) 160.4 kg (!) 158 kg    Examination:  General exam: Appears calm and comfortable  Respiratory system: No wheezing, no respiratory distress Cardiovascular system: S1 & S2, trace pitting edema bl LE Gastrointestinal system: Abdomen is nondistended, soft and nontender.  Central nervous system: Alert and oriented. No focal neurological deficits. Extremities: Symmetric 5 x 5 power. Skin: No rashes, lesions or ulcers Psychiatry:  Mood & affect appropriate.     Data Reviewed: I have personally reviewed following labs and imaging studies  CBC: Recent Labs  Lab 08/07/19 1424 08/08/19 0641 08/09/19 0542  WBC 9.3 8.3 9.3  NEUTROABS 6.7  --   --   HGB 11.6* 11.4* 11.3*  HCT 37.7 37.5 37.8  MCV 97.4 98.7 99.5  PLT 285 296 [id number]   Basic Metabolic Panel: Recent Labs  Lab 08/07/19 1424 08/08/19 0641 08/09/19 0542  NA 140 139 138  K 3.7 3.8 3.9  CL 105 105 102  CO2 25 26 29   GLUCOSE 96 104* 109*  BUN 10 10 12   CREATININE 0.78 0.79 0.93  CALCIUM 9.4 9.4 9.5   GFR: Estimated Creatinine Clearance: 119.4 mL/min (by C-G formula based on SCr of 0.93 mg/dL). Liver Function Tests: Recent Labs  Lab 08/07/19 1424  AST 16  ALT 26  ALKPHOS 53  BILITOT 0.6  PROT  7.5  ALBUMIN 4.2   No results for input(s): LIPASE, AMYLASE in the last 168 hours. No results for input(s): AMMONIA in the last 168 hours. Coagulation Profile: No results for input(s): INR, PROTIME in the last 168 hours. Cardiac Enzymes: No results for input(s): CKTOTAL, CKMB, CKMBINDEX, TROPONINI in the last 168 hours. BNP (last 3 results) No results for input(s): PROBNP in the last 8760 hours. HbA1C: No results for input(s): HGBA1C in the last 72 hours. CBG: No results for input(s): GLUCAP in the last 168 hours. Lipid Profile: No results for input(s): CHOL, HDL, LDLCALC, TRIG, CHOLHDL, LDLDIRECT in the last 72 hours. Thyroid Function Tests: Recent Labs    08/08/19 0641  TSH 1.215   Anemia Panel: No results for input(s): VITAMINB12, FOLATE, FERRITIN, TIBC, IRON, RETICCTPCT in the last 72 hours. Sepsis Labs: No results for input(s): PROCALCITON, LATICACIDVEN in the last 168 hours.  Recent Results (from the past 240 hour(s))  SARS CORONAVIRUS 2 (TAT 6-24 HRS) Nasopharyngeal Nasopharyngeal Swab     Status: None   Collection Time: 08/07/19  4:18 PM   Specimen: Nasopharyngeal Swab  Result Value Ref Range Status   SARS Coronavirus 2 NEGATIVE NEGATIVE Final    Comment: (NOTE) SARS-CoV-2 target nucleic acids are NOT DETECTED. The SARS-CoV-2 RNA is generally detectable in upper and lower respiratory specimens during the acute phase of infection. Negative results do not preclude SARS-CoV-2 infection, do not rule out co-infections with other pathogens, and should not be used as the sole basis for treatment or other patient management decisions. Negative results must be combined with clinical observations, patient history, and epidemiological information. The expected result is Negative. Fact Sheet for Patients: SugarRoll.be Fact Sheet for Healthcare Providers: https://www.woods-mathews.com/ This test is not yet approved or cleared by the  [place] FDA and  has been authorized for detection and/or diagnosis of SARS-CoV-2 by FDA under an Emergency Use Authorization (EUA). This EUA will remain  in effect (meaning this test can be used) for the duration of the COVID-19 declaration under Section 56 4(b)(1) of the Act, 21 U.S.C. section 360bbb-3(b)(1), unless the authorization is terminated or revoked sooner. Performed at [facility], [place] [address], [place], [place] [address]          Radiology Studies: ECHOCARDIOGRAM COMPLETE  Result Date: 08/09/2019   ECHOCARDIOGRAM REPORT   Patient Name:   Christine [name] Date of Exam: 08/09/2019 Medical Rec #:  [id number]       Height:       64.0 in Accession #:    [id number]      Weight:       348.3 lb Date of Birth:  03/17/1976      BSA:          2.48 m Patient Age:    43 years        BP:           127/71  mmHg Patient Gender: F               HR:           90 bpm. Exam Location:  Inpatient Procedure: 2D Echo Indications:    Dyspnea R.06.00  History:        Patient has prior history of Echocardiogram examinations, most                 recent 06/10/2019. Risk Factors:Hypertension.  Sonographer:    Mikki Santee RDCS (AE) Referring Phys: [place]  1. Left ventricular ejection fraction, by visual estimation, is 60 to 65%. The left ventricle has normal function. There is no left ventricular hypertrophy.  2. The left ventricle has no regional wall motion abnormalities.  3. Global right ventricle has normal systolic function.The right ventricular size is normal. No increase in right ventricular wall thickness.  4. Left atrial size was normal.  5. Right atrial size was normal.  6. The mitral valve is normal in structure. No evidence of mitral valve regurgitation.  7. The tricuspid valve is normal in structure.  8. The aortic valve is normal in structure. Aortic valve regurgitation is not visualized.  9. The pulmonic valve was normal in structure. Pulmonic valve  regurgitation is not visualized. 10. The atrial septum is grossly normal. FINDINGS  Left Ventricle: Left ventricular ejection fraction, by visual estimation, is 60 to 65%. The left ventricle has normal function. The left ventricle has no regional wall motion abnormalities. There is no left ventricular hypertrophy. Right Ventricle: The right ventricular size is normal. No increase in right ventricular wall thickness. Global RV systolic function is has normal systolic function. Left Atrium: Left atrial size was normal in size. Right Atrium: Right atrial size was normal in size Pericardium: There is no evidence of pericardial effusion. Mitral Valve: The mitral valve is normal in structure. No evidence of mitral valve regurgitation. Tricuspid Valve: The tricuspid valve is normal in structure. Tricuspid valve regurgitation is trivial. Aortic Valve: The aortic valve is normal in structure. Aortic valve regurgitation is not visualized. Pulmonic Valve: The pulmonic valve was normal in structure. Pulmonic valve regurgitation is not visualized. Pulmonic regurgitation is not visualized. Aorta: The aortic root and ascending aorta are structurally normal, with no evidence of dilitation. IAS/Shunts: The atrial septum is grossly normal.  LEFT VENTRICLE PLAX 2D LVIDd:         5.00 cm  Diastology LVIDs:         3.32 cm  LV e' lateral:   12.70 cm/s LV PW:         1.02 cm  LV E/e' lateral: 8.2 LV IVS:        0.99 cm  LV e' medial:    9.90 cm/s LVOT diam:     2.00 cm  LV E/e' medial:  10.5 LV SV:         73 ml LV SV Index:   26.43 LVOT Area:     3.14 cm  RIGHT VENTRICLE RV S prime:     12.70 cm/s TAPSE (M-mode): 1.8 cm LEFT ATRIUM             Index       RIGHT ATRIUM           Index LA diam:        4.20 cm 1.70 cm/m  RA Area:     14.30 cm LA Vol (A2C):   37.7 ml 15.22 ml/m RA Volume:  36.80 ml  14.86 ml/m LA Vol (A4C):   38.1 ml 15.38 ml/m LA Biplane Vol: 40.8 ml 16.47 ml/m  AORTIC VALVE LVOT Vmax:   120.00 cm/s LVOT Vmean:   82.500 cm/s LVOT VTI:    0.199 m  AORTA Ao Root diam: 3.00 cm MITRAL VALVE MV Area (PHT): 2.95 cm              SHUNTS MV PHT:        74.53 msec            Systemic VTI:  0.20 m MV Decel Time: 257 msec              Systemic Diam: 2.00 cm MV E velocity: 104.00 cm/s 103 cm/s MV A velocity: 90.20 cm/s  70.3 cm/s MV E/A ratio:  1.15        1.5  Mertie Moores MD Electronically signed by Mertie Moores MD Signature Date/Time: 08/09/2019/1:50:32 PM    Final         Scheduled Meds: . Derrill Memo ON 08/10/2019] amLODipine  5 mg Oral Daily  . ARIPiprazole  15 mg Oral Daily  . enoxaparin (LOVENOX) injection  80 mg Subcutaneous QHS  . furosemide  20 mg Intravenous Daily  . [START ON 08/10/2019] hydrochlorothiazide  12.5 mg Oral Daily  . lamoTRIgine  150 mg Oral BID  . lithium carbonate  600 mg Oral Daily  . lithium carbonate  900 mg Oral QHS  . metoprolol succinate  100 mg Oral Daily   Continuous Infusions:   LOS: 1 day    Time spent: 27 minutes    Blain Pais, MD [facility]   If 7PM-7AM, please contact night-coverage [email or web address] Password [facility] 08/09/2019, 6:06 PM

## 2019-08-09 NOTE — Progress Notes (Signed)
  Echocardiogram 2D Echocardiogram has been performed.  Jennette Dubin 08/09/2019, 12:13 PM

## 2019-08-09 NOTE — Progress Notes (Signed)
   Vital Signs MEWS/VS Documentation      08/08/2019 1345 08/08/2019 1917 08/08/2019 2123 08/09/2019 0548   MEWS Score:  2  2  1  2    MEWS Score Color:  Yellow  Yellow  Green  Yellow   Resp:  (!) 30  --  (!) 24  (!) 28   Pulse:  81  --  72  77   BP:  122/73  --  129/68  123/78   Temp:  98.5 F (36.9 C)  --  98 F (36.7 C)  (!) 97.5 F (36.4 C)   O2 Device:  Nasal Cannula  --  Nasal Cannula  Nasal Cannula   O2 Flow Rate (L/min):  2 L/min  --  --  --       Yellow MEWS guidelines being followed due to increased respiratory rate. 02  97% Oxygen increased to 2.5 L. Will continue to monitor    Nat Math 08/09/2019,6:05 AM

## 2019-08-09 NOTE — Telephone Encounter (Signed)
New Message    Dr Hayes Ludwig is calling from the hospital and says the pt is admitted and is needing a cardiology consult    Please advise

## 2019-08-10 ENCOUNTER — Encounter (HOSPITAL_COMMUNITY): Payer: Self-pay | Admitting: Family Medicine

## 2019-08-10 LAB — BASIC METABOLIC PANEL
Anion gap: 10 (ref 5–15)
BUN: 13 mg/dL (ref 6–20)
CO2: 28 mmol/L (ref 22–32)
Calcium: 9.5 mg/dL (ref 8.9–10.3)
Chloride: 100 mmol/L (ref 98–111)
Creatinine, Ser: 0.92 mg/dL (ref 0.44–1.00)
GFR calc Af Amer: >60 mL/min (ref >60)
GFR calc non Af Amer: >60 mL/min (ref >60)
Glucose, Bld: 102 mg/dL — ABNORMAL HIGH (ref 70–99)
Potassium: 4 mmol/L (ref 3.5–5.1)
Sodium: 138 mmol/L (ref 135–145)

## 2019-08-10 LAB — CBC
HCT: 39.7 % (ref 36.0–46.0)
Hemoglobin: 12 g/dL (ref 12.0–15.0)
MCH: 30.1 pg (ref 26.0–34.0)
MCHC: 30.2 g/dL (ref 30.0–36.0)
MCV: 99.5 fL (ref 80.0–100.0)
Platelets: 316 10*3/uL (ref 150–400)
RBC: 3.99 MIL/uL (ref 3.87–5.11)
RDW: 15.6 % — ABNORMAL HIGH (ref 11.5–15.5)
WBC: 10.1 10*3/uL (ref 4.0–10.5)
nRBC: 0 % (ref 0.0–0.2)

## 2019-08-10 MED ORDER — LITHIUM CARBONATE 300 MG PO CAPS: 600.0000 mg | ORAL_CAPSULE | ORAL | Status: DC

## 2019-08-10 MED ORDER — HYDROCHLOROTHIAZIDE 12.5 MG PO CAPS: 12.5000 mg | ORAL_CAPSULE | Freq: Every day | ORAL | 0 refills | Status: DC

## 2019-08-10 MED ORDER — AMLODIPINE BESYLATE 5 MG PO TABS: 5.0000 mg | ORAL_TABLET | Freq: Every day | ORAL | 0 refills | Status: AC

## 2019-08-10 NOTE — Discharge Summary (Signed)
Physician Discharge Summary  Sharyl Tuey [id number] DOB: Apr 12, 1976 DOA: 08/07/2019  PCP: Maude Leriche, PA-C  Admit date: 08/07/2019 Discharge date: 08/10/2019  Discharge disposition: Home   Recommendations for Outpatient Follow-Up:    Patient followed by cardiology/CHF clinic   Discharge Diagnosis:   Active Problems:   Bipolar disorder ([place])   HTN (hypertension)   Obesity hypoventilation syndrome (HCC)   OSA (obstructive sleep apnea)   Sinus tachycardia   Pulmonary edema   Acute diastolic heart failure ([place])    Discharge Condition: Stable.  Diet recommendation: Low-salt diet  Code status: Full code    Hospital Course:   [name] is a 44 year old man with medical history significant for morbid obesity (BMI 60), bipolar disorder, hypertension, OSA.  She presented to the hospital with shortness of breath and lower extremity edema.  She was admitted to the hospital for acute diastolic CHF and acute hypoxemic respiratory failure.  2D echo continued for 2020 showed EF estimated at 60 to 65%.  She was treated with IV Lasix.  She was seen in consultation by cardiologist who recommended that patient be discharged HCTZ and lower dose of amlodipine (reduced from 10 to 5 mg daily).  Her condition has improved and she has been weaned off of oxygen.  She is deemed stable for discharge to home.      Discharge Exam:   Vitals:   08/09/19 2203 08/10/19 0155  BP: 127/65 125/68  Pulse: 75 78  Resp: 20 20  Temp: 98.5 F (36.9 C) 98.2 F (36.8 C)  SpO2: 94% 94%   Vitals:   08/09/19 1408 08/09/19 1831 08/09/19 2203 08/10/19 0155  BP: 128/67 118/67 127/65 125/68  Pulse: 81 79 75 78  Resp: 18 16 20 20   Temp: 98.1 F (36.7 C) 98.1 F (36.7 C) 98.5 F (36.9 C) 98.2 F (36.8 C)  TempSrc:      SpO2: 100% 98% 94% 94%  Weight:      Height:         GEN: NAD SKIN: No rash EYES: EOMI ENT: MMM CV: RRR PULM: CTA B ABD: soft, obese, NT, +BS CNS: AAO x  3, non focal EXT: No edema or tenderness   The results of significant diagnostics from this hospitalization (including imaging, microbiology, ancillary and laboratory) are listed below for reference.     Procedures and Diagnostic Studies:   CT Angio Chest PE W/Cm &/Or Wo Cm  Result Date: 08/07/2019 CLINICAL DATA:  Shortness of breath EXAM: CT ANGIOGRAPHY CHEST WITH CONTRAST TECHNIQUE: Multidetector CT imaging of the chest was performed using the standard protocol during bolus administration of intravenous contrast. Multiplanar CT image reconstructions and MIPs were obtained to evaluate the vascular anatomy. CONTRAST:  180mL OMNIPAQUE IOHEXOL 350 MG/ML SOLN COMPARISON:  01/30/2019 FINDINGS: Cardiovascular: Examination is limited secondary to suboptimal contrast bolus timing, respiratory motion, and beam hardening artifact related to patient body habitus. There is no evidence of central, main, or lobar branch filling defect to suggest pulmonary embolism. The segmental and subsegmental branch arteries are suboptimally evaluated. Prominence of the pulmonary vasculature suggesting venous congestion. Heart size is normal. Thoracic aorta is nonaneurysmal. Mediastinum/Nodes: No enlarged mediastinal, hilar, or axillary lymph nodes. Thyroid gland, trachea, and esophagus demonstrate no significant findings. Lungs/Pleura: No focal airspace consolidation, pleural effusion, or pneumothorax. Upper Abdomen: No acute abnormality. Musculoskeletal: No chest wall abnormality. No acute or significant osseous findings. Review of the MIP images confirms the above findings. IMPRESSION: 1. Slightly limited exam. There is no evidence  of central, main, or lobar branch filling defect to suggest pulmonary embolism. 2. Findings suggesting pulmonary vascular congestion. No airspace consolidation or overt pulmonary edema. Electronically Signed   By: Davina Poke D.O.   On: 08/07/2019 17:52   DG Chest Portable 1 View  Result Date:  08/07/2019 CLINICAL DATA:  Pt presents with c/o cough, shortness of breath, and wheezing since Wednesday. Medical hx of HTN. EXAM: PORTABLE CHEST 1 VIEW COMPARISON:  Chest radiograph 01/30/2019 FINDINGS: Stable cardiomediastinal contours with enlarged heart size. Central vascular congestion without overt edema. No new focal infiltrate. No pneumothorax or large pleural effusion. No acute finding in the visualized skeleton. IMPRESSION: Cardiomegaly with central vascular congestion, no overt edema or active infection. Electronically Signed   By: Audie Pinto M.D.   On: 08/07/2019 13:01     Labs:   Basic Metabolic Panel: Recent Labs  Lab 08/07/19 1424 08/08/19 0641 08/09/19 0542 08/10/19 0514  NA 140 139 138 138  K 3.7 3.8 3.9 4.0  CL 105 105 102 100  CO2 25 26 29 28   GLUCOSE 96 104* 109* 102*  BUN 10 10 12 13   CREATININE 0.78 0.79 0.93 0.92  CALCIUM 9.4 9.4 9.5 9.5   GFR Estimated Creatinine Clearance: 120.7 mL/min (by C-G formula based on SCr of 0.92 mg/dL). Liver Function Tests: Recent Labs  Lab 08/07/19 1424  AST 16  ALT 26  ALKPHOS 53  BILITOT 0.6  PROT 7.5  ALBUMIN 4.2   No results for input(s): LIPASE, AMYLASE in the last 168 hours. No results for input(s): AMMONIA in the last 168 hours. Coagulation profile No results for input(s): INR, PROTIME in the last 168 hours.  CBC: Recent Labs  Lab 08/07/19 1424 08/08/19 0641 08/09/19 0542 08/10/19 0514  WBC 9.3 8.3 9.3 10.1  NEUTROABS 6.7  --   --   --   HGB 11.6* 11.4* 11.3* 12.0  HCT 37.7 37.5 37.8 39.7  MCV 97.4 98.7 99.5 99.5  PLT 285 296 286 316   Cardiac Enzymes: No results for input(s): CKTOTAL, CKMB, CKMBINDEX, TROPONINI in the last 168 hours. BNP: Invalid input(s): POCBNP CBG: No results for input(s): GLUCAP in the last 168 hours. D-Dimer Recent Labs    08/07/19 1424  DDIMER 0.50   Hgb A1c No results for input(s): HGBA1C in the last 72 hours. Lipid Profile No results for input(s): CHOL,  HDL, LDLCALC, TRIG, CHOLHDL, LDLDIRECT in the last 72 hours. Thyroid function studies Recent Labs    08/08/19 0641  TSH 1.215   Anemia work up No results for input(s): VITAMINB12, FOLATE, FERRITIN, TIBC, IRON, RETICCTPCT in the last 72 hours. Microbiology Recent Results (from the past 240 hour(s))  SARS CORONAVIRUS 2 (TAT 6-24 HRS) Nasopharyngeal Nasopharyngeal Swab     Status: None   Collection Time: 08/07/19  4:18 PM   Specimen: Nasopharyngeal Swab  Result Value Ref Range Status   SARS Coronavirus 2 NEGATIVE NEGATIVE Final    Comment: (NOTE) SARS-CoV-2 target nucleic acids are NOT DETECTED. The SARS-CoV-2 RNA is generally detectable in upper and lower respiratory specimens during the acute phase of infection. Negative results do not preclude SARS-CoV-2 infection, do not rule out co-infections with other pathogens, and should not be used as the sole basis for treatment or other patient management decisions. Negative results must be combined with clinical observations, patient history, and epidemiological information. The expected result is Negative. Fact Sheet for Patients: SugarRoll.be Fact Sheet for Healthcare Providers: https://www.woods-mathews.com/ This test is not yet approved or cleared  by the [place] and  has been authorized for detection and/or diagnosis of SARS-CoV-2 by FDA under an Emergency Use Authorization (EUA). This EUA will remain  in effect (meaning this test can be used) for the duration of the COVID-19 declaration under Section 56 4(b)(1) of the Act, 21 U.S.C. section 360bbb-3(b)(1), unless the authorization is terminated or revoked sooner. Performed at [facility], [place] [address] [place], [place] [address]      Discharge Instructions:   Discharge Instructions    (HEART FAILURE PATIENTS) Call MD:  Anytime you have any of the following symptoms: 1) 3 pound weight gain in 24 hours or 5 pounds in  1 week 2) shortness of breath, with or without a dry hacking cough 3) swelling in the hands, feet or stomach 4) if you have to sleep on extra pillows at night in order to breathe.   Complete by: As directed    AMB referral to CHF clinic   Complete by: As directed    Diet - low sodium heart healthy   Complete by: As directed    Diet Carb Modified   Complete by: As directed    Increase activity slowly   Complete by: As directed    Schedule appointment   Complete by: As directed    Follow up with PCP in 1 to 2 weeks and and cardiologist in 2 to 4 weeks     Allergies as of 08/10/2019      Reactions   Penicillins Rash, Other (See Comments)   Has patient had a PCN reaction causing immediate rash, facial/tongue/throat swelling, SOB or lightheadedness with hypotension: Yes Has patient had a PCN reaction causing severe rash involving mucus membranes or skin necrosis: Unknown Has patient had a PCN reaction that required hospitalization: No Has patient had a PCN reaction occurring within the last 10 years: No If all of the above answers are "NO", then may proceed with Cephalosporin use.      Medication List    STOP taking these medications   Erenumab-aooe 140 MG/ML Soaj Commonly known as: Aimovig     TAKE these medications   acetaminophen 325 MG tablet Commonly known as: TYLENOL Take 650 mg by mouth every 6 (six) hours as needed for mild pain or headache.   amLODipine 5 MG tablet Commonly known as: NORVASC Take 1 tablet (5 mg total) by mouth daily. What changed:   medication strength  how much to take   ARIPiprazole 15 MG tablet Commonly known as: ABILIFY Take 1 tablet (15 mg total) by mouth daily.   clonazePAM 0.5 MG tablet Commonly known as: KLONOPIN Take 1 tablet (0.5 mg total) by mouth daily as needed for anxiety.   fluticasone 50 MCG/ACT nasal spray Commonly known as: FLONASE Place into both nostrils daily as needed for allergies or rhinitis.   hydrochlorothiazide  12.5 MG capsule Commonly known as: MICROZIDE Take 1 capsule (12.5 mg total) by mouth daily. Start taking on: August 11, 2019   lamoTRIgine 150 MG tablet Commonly known as: LAMICTAL Take 1 tablet (150 mg total) by mouth 2 (two) times daily. For mood   lithium carbonate 300 MG capsule Take 2-3 capsules (600-900 mg total) by mouth See admin instructions. Take 2 capsules in AM and 3 capsulse at bed time   metFORMIN 500 MG tablet Commonly known as: GLUCOPHAGE Take 1 tablet (500 mg total) by mouth 2 (two) times daily with a meal.   metoprolol succinate 100 MG 24 hr tablet  Commonly known as: TOPROL-XL Take 1 tablet (100 mg total) by mouth daily. Take with or immediately following a meal.   Vitamin D (Ergocalciferol) 1.25 MG (50000 UT) Caps capsule Commonly known as: DRISDOL Take 1 capsule (50,000 Units total) by mouth every 7 (seven) days.         Time coordinating discharge: 25 minutes  Signed:  Verneice Caspers  [facility] 08/10/2019, 12:08 PM

## 2019-08-19 ENCOUNTER — Other Ambulatory Visit: Payer: Self-pay

## 2019-08-19 ENCOUNTER — Ambulatory Visit (INDEPENDENT_AMBULATORY_CARE_PROVIDER_SITE_OTHER): Payer: Medicare Other | Admitting: Psychiatry

## 2019-08-19 DIAGNOSIS — E559 Vitamin D deficiency, unspecified: Secondary | ICD-10-CM | POA: Diagnosis not present

## 2019-08-19 DIAGNOSIS — G4733 Obstructive sleep apnea (adult) (pediatric): Secondary | ICD-10-CM | POA: Diagnosis not present

## 2019-08-19 DIAGNOSIS — F411 Generalized anxiety disorder: Secondary | ICD-10-CM

## 2019-08-19 DIAGNOSIS — F3162 Bipolar disorder, current episode mixed, moderate: Secondary | ICD-10-CM | POA: Diagnosis not present

## 2019-08-19 DIAGNOSIS — I10 Essential (primary) hypertension: Secondary | ICD-10-CM | POA: Diagnosis not present

## 2019-08-19 DIAGNOSIS — Z9289 Personal history of other medical treatment: Secondary | ICD-10-CM | POA: Diagnosis not present

## 2019-08-19 DIAGNOSIS — Z79899 Other long term (current) drug therapy: Secondary | ICD-10-CM | POA: Diagnosis not present

## 2019-08-19 DIAGNOSIS — I5189 Other ill-defined heart diseases: Secondary | ICD-10-CM | POA: Diagnosis not present

## 2019-08-19 NOTE — Progress Notes (Signed)
Virtual Visit via Video Note  I connected with Christine [name] on 08/19/19 at  4:00 PM EST by a video enabled telemedicine application and verified that I am speaking with the correct person using two identifiers.  Location: Patient: Christine [name] Provider: Lise Auer, LCSW   I discussed the limitations of evaluation and management by telemedicine and the availability of in person appointments. The patient expressed understanding and agreed to proceed.  History of Present Illness: MDD and GAD   Observations/Objective: Counselor met with Christine [name] for individual therapy via Webex. Counselor assessed MH symptoms and progress on treatment plan goals. Christine [name] presents with moderate depression and high anxiety. Christine [name] denied suicidal ideation or self-harm behaviors. Christine [name] shared that since our last session she was hospitalized for raspatory and cardiac issues. While hospitalized Christine [name] reports reflecting on past year and identified that her depression and anxiety were worse than she previously thought due to the isolation and seclusion prompted by COVID restrictions. Counselor and [name] processed noted impact, discussed ways to address unhealthy habits and behaviors formed and identified tasks and skills to change behaviors. Christine [name] verbalized willingness and desire to function differently and need for application of skills. Christine [name] to implement practices and report back at next session. Counselor and [name] discussed discharge planning needs. Counselor to contact former therapist for transfer before start of maternity leave.   Assessment and Plan: Counselor will continue to meet with patient to address treatment plan goals. Patient will continue to follow recommendations of providers and implement skills learned in session.  Follow Up Instructions: Counselor will send information for next session via Webex.     I discussed the assessment and treatment plan with the patient. The patient was  provided an opportunity to ask questions and all were answered. The patient agreed with the plan and demonstrated an understanding of the instructions.   The patient was advised to call back or seek an in-person evaluation if the symptoms worsen or if the condition fails to improve as anticipated.  I provided 60 minutes of non-face-to-face time during this encounter.   Lise Auer, LCSW

## 2019-08-20 ENCOUNTER — Encounter (HOSPITAL_COMMUNITY): Payer: Self-pay | Admitting: Psychiatry

## 2019-08-23 ENCOUNTER — Other Ambulatory Visit: Payer: Self-pay

## 2019-08-23 ENCOUNTER — Ambulatory Visit (INDEPENDENT_AMBULATORY_CARE_PROVIDER_SITE_OTHER): Payer: BC Managed Care – PPO | Admitting: Family Medicine

## 2019-08-23 ENCOUNTER — Encounter (INDEPENDENT_AMBULATORY_CARE_PROVIDER_SITE_OTHER): Payer: Self-pay | Admitting: Family Medicine

## 2019-08-23 VITALS — BP 122/75 | HR 88 | Temp 98.3°F | Ht 64.0 in | Wt 337.0 lb

## 2019-08-23 DIAGNOSIS — E559 Vitamin D deficiency, unspecified: Secondary | ICD-10-CM | POA: Diagnosis not present

## 2019-08-23 DIAGNOSIS — Z9189 Other specified personal risk factors, not elsewhere classified: Secondary | ICD-10-CM | POA: Diagnosis not present

## 2019-08-23 DIAGNOSIS — R7303 Prediabetes: Secondary | ICD-10-CM | POA: Diagnosis not present

## 2019-08-23 DIAGNOSIS — Z6841 Body Mass Index (BMI) 40.0 and over, adult: Secondary | ICD-10-CM

## 2019-08-23 DIAGNOSIS — I5189 Other ill-defined heart diseases: Secondary | ICD-10-CM

## 2019-08-23 MED ORDER — METFORMIN HCL 500 MG PO TABS: 500.0000 mg | ORAL_TABLET | Freq: Two times a day (BID) | ORAL | 0 refills | Status: DC

## 2019-08-23 MED ORDER — VITAMIN D (ERGOCALCIFEROL) 1.25 MG (50000 UNIT) PO CAPS: 50000.0000 [IU] | ORAL_CAPSULE | ORAL | 0 refills | Status: DC

## 2019-08-25 NOTE — Progress Notes (Signed)
Chief Complaint:   OBESITY Christine [name] is here to discuss her progress with her obesity treatment plan along with follow-up of her obesity related diagnoses. Christine [name] is on keeping a food journal and adhering to recommended goals of 1600 calories and 90 grams of protein daily and states she is following her eating plan approximately 70% of the time. Christine [name] states she is walking for 10 minutes 2 times per week.  Today's visit was #: 45 Starting weight: 337 lbs Starting date: 10/07/2018 Today's weight: 337 lbs Today's date: 08/23/2019 Total lbs lost to date: 0 Total lbs lost since last in-office visit: 0  Interim History: Christine [name]'s last visit in the office was approximately 6 months ago. She was hospitalized recently with congestive heart failure due to uncontrolled sleep apnea. Her mood had worsened and she wasn't following her plan as well, but she is now ready to get back on track with her healthy and weight loss.  Subjective:   1. Diastolic dysfunction Christine [name] was diagnosed with congestive heart failure exacerbation although her echocardiogram was essentially within normal limits. Her diagnosis appears to be based on symptoms and presentation. She is working on getting her CPAP.  2. Pre-diabetes Christine [name] had been off her metformin, but she is ready to start again and she requests a refill today.  3. Vitamin D deficiency Christine [name] is stable on Vit D, and she requests a refill today.  4. At risk for diabetes mellitus Christine [name] is at higher than average risk for developing diabetes due to her obesity.   Assessment/Plan:   1. Diastolic dysfunction Christine [name] was encouraged ro start using her CPAP as soon as possible and will continue to monitor closely. We discussed nutrition and weight loss as a part of her treatment plan.  2. Pre-diabetes Christine [name] will continue to work on weight loss, exercise, and decreasing simple carbohydrates to help decrease the risk of diabetes. We will refill metformin for 1  month, and we will recheck labs in 1 month. We will continue to monitor.  - metFORMIN (GLUCOPHAGE) 500 MG tablet; Take 1 tablet (500 mg total) by mouth 2 (two) times daily with a meal.  Dispense: 60 tablet; Refill: 0  3. Vitamin D deficiency Low Vitamin D level contributes to fatigue and are associated with obesity, breast, and colon cancer. We will refill prescription Vit D for 1 month, and we will recheck labs in 1 month. Christine [name] will follow-up for routine testing of Vitamin D, at least 2-3 times per year to avoid over-replacement. We will continue to monitor.  - Vitamin D, Ergocalciferol, (DRISDOL) 1.25 MG (50000 UNIT) CAPS capsule; Take 1 capsule (50,000 Units total) by mouth every 7 (seven) days.  Dispense: 4 capsule; Refill: 0  4. At risk for diabetes mellitus Christine [name] was given approximately 15 minutes of diabetes education and counseling today. We discussed intensive lifestyle modifications today with an emphasis on weight loss as well as increasing exercise and decreasing simple carbohydrates in her diet. We also reviewed medication options with an emphasis on risk versus benefit of those discussed.   Repetitive spaced learning was employed today to elicit superior memory formation and behavioral change.  5. Class 3 severe obesity with serious comorbidity and body mass index (BMI) of 50.0 to 59.9 in adult, unspecified obesity type (HCC) Christine [name] is currently in the action stage of change. As such, her goal is to get back to weightloss efforts . She has agreed to keeping a food journal and adhering to recommended goals of 1600-1800 calories  and 90 grams of protein daily.   Behavioral modification strategies: no skipping meals and keeping a strict food journal.  Christine [name] has agreed to follow-up with our clinic in 2 to 3 weeks. She was informed of the importance of frequent follow-up visits to maximize her success with intensive lifestyle modifications for her multiple health conditions.    Objective:   Blood pressure 122/75, pulse 88, temperature 98.3 F (36.8 C), temperature source Oral, height 5\' 4"  (1.626 m), weight (!) 337 lb (152.9 kg), last menstrual period 07/29/2019, SpO2 98 %. Body mass index is 57.85 kg/m.  General: Cooperative, alert, well developed, in no acute distress. HEENT: Conjunctivae and lids unremarkable. Cardiovascular: Regular rhythm.  Lungs: Normal work of breathing. Neurologic: No focal deficits.   Lab Results  Component Value Date   CREATININE 0.92 08/10/2019   BUN 13 08/10/2019   NA 138 08/10/2019   K 4.0 08/10/2019   CL 100 08/10/2019   CO2 28 08/10/2019   Lab Results  Component Value Date   ALT 26 08/07/2019   AST 16 08/07/2019   ALKPHOS 53 08/07/2019   BILITOT 0.6 08/07/2019   Lab Results  Component Value Date   HGBA1C 5.7 (H) 10/07/2018   Lab Results  Component Value Date   INSULIN 19.5 10/07/2018   Lab Results  Component Value Date   TSH 1.215 08/08/2019   Lab Results  Component Value Date   CHOL 183 10/07/2018   HDL 38 (L) 10/07/2018   LDLCALC 131 (H) 10/07/2018   TRIG 70 10/07/2018   Lab Results  Component Value Date   WBC 10.1 08/10/2019   HGB 12.0 08/10/2019   HCT 39.7 08/10/2019   MCV 99.5 08/10/2019   PLT 316 08/10/2019   Lab Results  Component Value Date   IRON 64 07/06/2019   TIBC 300 07/06/2019   FERRITIN 62 07/06/2019   Attestation Statements:   Reviewed by clinician on day of visit: allergies, medications, problem list, medical history, surgical history, family history, social history, and previous encounter notes.   I, Trixie Dredge, am acting as transcriptionist for Dennard Nip, MD.  I have reviewed the above documentation for accuracy and completeness, and I agree with the above. -  Dennard Nip, MD

## 2019-09-02 ENCOUNTER — Telehealth: Payer: Self-pay | Admitting: Hematology

## 2019-09-02 ENCOUNTER — Other Ambulatory Visit: Payer: Medicare Other

## 2019-09-02 NOTE — Telephone Encounter (Signed)
Returned patient's phone call regarding rescheduling an appointment, patient's voicemail is full.

## 2019-09-06 ENCOUNTER — Other Ambulatory Visit: Payer: Self-pay

## 2019-09-06 ENCOUNTER — Encounter: Payer: Self-pay | Admitting: Neurology

## 2019-09-06 ENCOUNTER — Ambulatory Visit (INDEPENDENT_AMBULATORY_CARE_PROVIDER_SITE_OTHER): Payer: Medicare Other | Admitting: Neurology

## 2019-09-06 ENCOUNTER — Ambulatory Visit (HOSPITAL_COMMUNITY): Payer: Medicare Other | Admitting: Psychiatry

## 2019-09-06 VITALS — BP 138/77 | HR 100 | Temp 97.6°F | Ht 65.0 in | Wt 337.0 lb

## 2019-09-06 DIAGNOSIS — F418 Other specified anxiety disorders: Secondary | ICD-10-CM

## 2019-09-06 DIAGNOSIS — I5033 Acute on chronic diastolic (congestive) heart failure: Secondary | ICD-10-CM | POA: Insufficient documentation

## 2019-09-06 DIAGNOSIS — R000 Tachycardia, unspecified: Secondary | ICD-10-CM

## 2019-09-06 DIAGNOSIS — F411 Generalized anxiety disorder: Secondary | ICD-10-CM

## 2019-09-06 DIAGNOSIS — D649 Anemia, unspecified: Secondary | ICD-10-CM

## 2019-09-06 DIAGNOSIS — F3162 Bipolar disorder, current episode mixed, moderate: Secondary | ICD-10-CM

## 2019-09-06 DIAGNOSIS — E669 Obesity, unspecified: Secondary | ICD-10-CM

## 2019-09-06 DIAGNOSIS — G4731 Primary central sleep apnea: Secondary | ICD-10-CM | POA: Diagnosis not present

## 2019-09-06 NOTE — Progress Notes (Addendum)
S[facility]EP MED[facility]INE CLINIC   Provider:  Larey Seat, MD   Primary Care Physician:  Perlie Mayo Durel Salts   Referring Provider: [name], MD      HPI:  Christine [name] is a 44 y.o. female , seen here as in a revisit after originally being referred from Dr [email or web address] [email or web address]   Chief complaint according to patient :  Christine. [name] returns today for a revisit on 06 September 2019.  We have no contact since she finished her home sleep test on March 08, 2019.  She had been referred with a diagnosis of acute blood loss anemia, polycystic ovarian syndrome, essential hypertension, superobesity, chronic migraine, psychophysiological insomnia and inadequate sleep habits.  She was undergoing a home sleep test on [facility]. She was diagnosed with severe sleep apnea and an AHI of 61.9/h minimum of third of all apneas were central in origin the rest dominantly obstructive.  The heart rate was notably of high variability between tacky and bradycardia slow and fast heart rhythm.   In the meantime she was hospitalized in 08-09-2019  with congestive heart failure.  Her hospital physicians told her that she had CHF r;lated to sleep apnea.and that she needs to be treated urgently.   The patient was recommended to return urgently for a in lab titration but due to family obligations and the Covid restrictions she was not able to return in time.  She continues to ask variance nonrestorative sleep, the Epworth sleepiness score was today again endorsed at 13 points fatigue severity at 44 points, both elevated and I would like for her to undergo this time a split-night sleep study we will use 2 hours of an attended polysomnography study to document again her high AHI and then convert to a CPAP titration the same night.  This kind of study protocol will require her to have Covid testing 3 days in advance.  She has not had covid 19, not to her knowledge , and has not been vaccinated.     HPI: Sleep and medical history:  Christine. [name] is a 53-year young female African-American with a history of morbid obesity, high blood pressure, polycystic ovarian syndrome, osteoarthritis, bipolar disorder with many hospitalizations in the past, the last in February 2020.  She also reports headaches and difficulty sleeping.  She reports that her migraines are worsening, she reports that these are associated with nausea, photosensitivity, blurred vision.  Headaches start with the right eye or behind the right eye with a pulsating pressure sensation.  She has about 16 headaches a month.  She had part previously maniac hypomanic and mixed episodes she has decreased her caffeine intake which she had increased before to help with headaches but was now able to reduce.  She reviewed her list of medications with me.  She was last seen in person on 09 September 2018 with a BMI of 58.24 constituting super obesity and a high risk for obesity hypoventilation.   Family medical and sleep history: empty   Social history: The patient is married she has young children that still lives at home,   Sleep habits: She reports her dinnertime is usually 7 PM and her nighttime medication are taking at 9:30 PM sometimes she will fall asleep right after she takes the medication but interestingly she does not go to bed she sleeps on her sofa in her living room.  There she will wake up at 11 PM and still postpone going to bed often until midnight or later.  Between  1 and 2 AM she will finally fall asleep in her bed, in her bedroom.  She describes the bedroom is quiet and dark but not cool.  She states her husband likes it warmer and 80 F is not rarely found in her bedroom.  She sleeps on her side he uses 2 pillows and she sleeps on a flat nonadjustable bed.  She has 1 to possible breaks each night she snores loudly according to her husband and she wakes after each bathroom break for about 30 minutes before she is able to initiate sleep again.  She considers herself  a light sleeper and she wakes up because the Make sounds, will request any kind of minor stimulus is reaching her.  She states that she has no palpitations, no pain and she is not dreaming vividly.  She rises at 630 after spontaneous arousal but she is not refreshed she often has morning headaches and headaches do not wake her she wakes with them.  She has a dry mouth in the morning and her insomnia component has gotten worse over the last months.  She is avoiding naps because she will not get nighttime sleep if she sleeps in daytime.  She does realize that her sleep disorder is closely related to her mood.  However her husband has noted her to gasp and snort and there may be very well a sleep apnea-hypopnea-hypoventilation component present.  Review of Systems: Out of a complete 14 system review, the patient complains of only the following symptoms, and all other reviewed systems are negative. How likely are you to doze in the following situations: 0 = not likely, 1 = slight chance, 2 = moderate chance, 3 = high chance  Sitting and Reading? Watching Television? Sitting inactive in a public place (theater or meeting)? Lying down in the afternoon when circumstances permit? Sitting and talking to someone? Sitting quietly after lunch without alcohol? In a car, while stopped for a few minutes in traffic? As a passenger in a car for an hour without a break?  Total = 13/ 24 - higher than in June 2021.     Social History   Socioeconomic History  . Marital status: Married    Spouse name: Annaliese [name]  . Number of children: 2  . Years of education: 2 years of grad school + bach & HS  . Highest education level: Bachelor's degree (e.g., BA, AB, BS)  Occupational History  . Occupation: disabled  Tobacco Use  . Smoking status: Never Smoker  . Smokeless tobacco: Never Used  Substance and Sexual Activity  . Alcohol use: Yes    Comment: occasional, maybe once a month or so  . Drug use: No  .  Sexual activity: Yes    Partners: Male    Birth control/protection: None  Other Topics Concern  . Not on file  Social History Narrative   Lives at home with her husband and 2 adopted daughters.   Right handed   Caffeine: decreased intake, drinks rarely    Is on long-term disability.   Social Determinants of Health   Financial Resource Strain:   . Difficulty of Paying Living Expenses: Not on file  Food Insecurity:   . Worried About Charity fundraiser in the Last Year: Not on file  . Ran Out of Food in the Last Year: Not on file  Transportation Needs:   . Lack of Transportation (Medical): Not on file  . Lack of Transportation (Non-Medical): Not on file  Physical  Activity:   . Days of Exercise per Week: Not on file  . Minutes of Exercise per Session: Not on file  Stress:   . Feeling of Stress : Not on file  Social Connections:   . Frequency of Communication with Friends and Family: Not on file  . Frequency of Social Gatherings with Friends and Family: Not on file  . Attends Religious Services: Not on file  . Active Member of Clubs or Organizations: Not on file  . Attends Archivist Meetings: Not on file  . Marital Status: Not on file  Intimate Partner Violence:   . Fear of Current or Ex-Partner: Not on file  . Emotionally Abused: Not on file  . Physically Abused: Not on file  . Sexually Abused: Not on file    Family History  Problem Relation Age of Onset  . Anxiety disorder Mother   . Depression Mother   . Cancer Mother        pancreatic   . High blood pressure Mother   . Hypertension Mother   . Hyperlipidemia Mother   . Cancer Father        colon  . High blood pressure Father   . Diabetes Father   . Hypertension Father   . Hyperlipidemia Father   . Heart disease Father   . Stroke Father   . Kidney disease Father   . Sleep apnea Father   . Obesity Father   . Cancer Brother 6       leukemia, childhood  . High blood pressure Brother   . Migraines  Neg Hx     Past Medical History:  Diagnosis Date  . Anemia    Presumably from menorrhagia/menomenorrhagia; has had both blood and iron transfusions  . Anxiety   . Back pain   . Bipolar 1 disorder ([place])    With depression  . Depression   . Essential hypertension   . Fibromyalgia   . Headache   . History of blood transfusion 08/2017   [facility]  . History of hiatal hernia   . Infertility, female   . Lactose intolerance   . Lower extremity edema   . Migraines   . Morbid obesity ([place])   . Osteoarthritis    Hands, ankles; HLA-B27 positive  . PCOS (polycystic ovarian syndrome)   . Seasonal allergies    With recurrent allergic rhinitis  . Uterine leiomyoma   . Vitamin D deficiency     Past Surgical History:  Procedure Laterality Date  . CARDIAC CT ANGIOGRAM  03/2019    Ca Score 0. Normal Coronary origin R dominant. No evidence of CAD. ? Liver nodules - poorly visualized (consider MRI w & w/o Gad contrast)  . CERVICAL POLYPECTOMY  01/15/2018   Procedure: CERVICAL POLYPECTOMY;  Surgeon: Arvella Nigh, MD;  Location: [facility];  Service: Gynecology;;  . COLONOSCOPY  2008   Normal  . DIAGNOSTIC LAPAROSCOPY  08/1999   dermoid cyst, [facility]  . DILATION AND CURETTAGE OF UTERUS  05/2004   [facility]  . DILATION AND CURETTAGE OF UTERUS  04/2015  . HYSTEROSCOPY WITH D & C  07/22/2011   Procedure: DILATATION AND CURETTAGE /HYSTEROSCOPY;  Surgeon: Cyril Mourning, MD;  Location: [facility];  Service: Gynecology;;  . HYSTEROSCOPY WITH D & C N/A 01/15/2018   Procedure: DILATATION AND CURETTAGE [place] WITH MYOSURE;  Surgeon: Arvella Nigh, MD;  Location: [facility];  Service: Gynecology;  Laterality: N/A;  . 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  TRANSTHORACIC ECHOCARDIOGRAM  08/2017    EF 65 to 70% with vigorous wall motion.  Suggestion of high cardiac output (possibly related to anemia thyrotoxicosis, Pregnancy, sepsis etc.) -- was in setting of symptomatic anemia - Hgb 5.7    Current Outpatient  Medications  Medication Sig Dispense Refill  . acetaminophen (TYLENOL) 325 MG tablet Take 650 mg by mouth every 6 (six) hours as needed for mild pain or headache.    [name] amLODipine (NORVASC) 5 MG tablet Take 1 tablet (5 mg total) by mouth daily. 30 tablet 0  . ARIPiprazole (ABILIFY) 15 MG tablet Take 1 tablet (15 mg total) by mouth daily. 30 tablet 2  . clonazePAM (KLONOPIN) 0.5 MG tablet Take 1 tablet (0.5 mg total) by mouth daily as needed for anxiety. 30 tablet 0  . fluticasone (FLONASE) 50 MCG/ACT nasal spray Place into both nostrils daily as needed for allergies or rhinitis.    . hydrochlorothiazide (MICROZIDE) 12.5 MG capsule Take 1 capsule (12.5 mg total) by mouth daily. 30 capsule 0  . lamoTRIgine (LAMICTAL) 150 MG tablet Take 1 tablet (150 mg total) by mouth 2 (two) times daily. For mood 60 tablet 2  . lithium carbonate 300 MG capsule Take 2-3 capsules (600-900 mg total) by mouth See admin instructions. Take 2 capsules in AM and 3 capsulse at bed time    . metFORMIN (GLUCOPHAGE) 500 MG tablet Take 1 tablet (500 mg total) by mouth 2 (two) times daily with a meal. 60 tablet 0  . Vitamin D, Ergocalciferol, (DRISDOL) 1.25 MG (50000 UNIT) CAPS capsule Take 1 capsule (50,000 Units total) by mouth every 7 (seven) days. 4 capsule 0  . metoprolol succinate (TOPROL-XL) 100 MG 24 hr tablet Take 1 tablet (100 mg total) by mouth daily. Take with or immediately following a meal. 90 tablet 3   No current facility-administered medications for this visit.    Allergies as of 09/06/2019 - Review Complete 09/06/2019  Allergen Reaction Noted  . Penicillins Rash and Other (See Comments) 07/12/2011    Vitals: BP 138/77   Pulse 100   Temp 97.6 F (36.4 C)   Ht '5\' 5"'$  (1.651 m)   Wt (!) 337 lb (152.9 kg)   BMI 56.08 kg/m  Last Weight:  Wt Readings from Last 1 Encounters:  09/06/19 (!) 337 lb (152.9 kg)   HWT:UUEK mass index is 56.08 kg/m.     Last Height:   Ht Readings from Last 1 Encounters:    09/06/19 '5\' 5"'$  (1.651 m)   Examination:   General: The patient is awake, alert and appears not in acute distress.   The patient is well groomed. Head: Normocephalic, atraumatic.  Neck is supple. Mallampati 4,  neck circumference:15. 5   Nasal airflow patent , . Retrognathia is seen. Patient used to wear a dental liner.  without distended neck veins. Respiratory: can hold breath 19 seconds, normal nasal airflow.  Skin:  Without evidence of facial or hand/ finger edema, or rash Trunk: BMI is 55 plus .  The patient's posture is erect.  Neurologic exam : The patient is awake and alert, oriented to place and time.   Attention span & concentration ability appears normal.  Speech is fluent,  without  dysarthria, dysphonia or aphasia.  Mood and affect are appropriate.  Cranial nerves: no loss of taste or smell.  Pupils are equal - Extraocular movements  in vertical and horizontal planes intact  Facial motor strength is symmetric and tongue and uvula move midline. Shoulder  shrug was symmetrical.   Motor exam:  Symmetric muscle bulk and ROM  in all extremities. Coordination:l without evidence of ataxia, dysmetria or tremor. Gait and station: Patient walks without assistive device.  Assessment and Plan: Morning headaches endorsed and now also  EDS, Epworth score was 13 , up from 6 at last visit -  night time sleep is very fragmented, not refreshing, restoring, and she snores.  HST confirmed  Severe sleep apnea.   Follow Up Instructions:  Patient was hospitalized in 08-09-2019  with congestive heart failure.  Her hospital physicians told her that she had CHF r;lated to sleep apnea.and that she needs to be treated urgently. attended SPLIT night protocol, with COVID 19 testing.   I discussed the assessment and treatment plan with the patient. The patient was provided an opportunity to ask questions and all were answered. The patient agreed with the plan and demonstrated an understanding of  the instructions.   The patient was advised to call back or seek an in-person evaluation if the symptoms worsen or if the condition fails to improve as anticipated.  I provided over  25 minutes of non-face-to-face time during this encounter.  I reviewed the chart, her recent hopsitalization and long time complaint of obesity. I also noted that Tramadol, an opioid was prescribed to this patient, which may push her into hypoventilation.   Patient will follow up after SPLIT  with NP.   Larey Seat, MD 08/05/9145, 82:95 AM  Certified in Neurology by ABPN Certified in [place] by [facility] [address] [place], [place] [address]

## 2019-09-09 ENCOUNTER — Encounter (HOSPITAL_COMMUNITY): Payer: Self-pay | Admitting: Psychiatry

## 2019-09-09 NOTE — Progress Notes (Signed)
Virtual Visit via Video Note  I connected with Louis Meckel on 09/06/19 at  4:00 PM EST by a video enabled telemedicine application and verified that I am speaking with the correct person using two identifiers.  Location: Patient: Patient Home Provider: Home Office   I discussed the limitations of evaluation and management by telemedicine and the availability of in person appointments. The patient expressed understanding and agreed to proceed.  History of Present Illness: Bipolar 1 DO and GAD   Treatment Plan Goals: [name] would like to feel less worthless by exploring thoughts/feelings/interests in therapy to find more meaning an purpose in life to alleviate depressive and anxiety symptoms.  Observations/Objective: Counselor met with [name] for individual therapy via Webex. Counselor assessed MH symptoms and progress on treatment plan goals, with patient reporting that she is experiencing stability in mood over the past two weeks and has implemented self-care strategies since last session. [name] presents with moderate depression and moderate anxiety. [name] denied suicidal ideation or self-harm behaviors.   [name] shared that she has actively been following up with doctors appointments that she neglected to attend during the pandemic. They have adjusted medications and created new treatment plans to address physical health needs. [name] requested Counselor communicate with psychiatrist about changes for continuity of care. Counselor and [name] explored functioning due to recent halt in taking Lithium, with [name] reporting that she has not had any negative side effects and moods have been stable. [name] gave additional reasons for wanting to remain off of Lithium related to compliance and life goals. Counselor and [name] dicussed strategies for becoming more consistent with medication management. Counselor and [name] processed comment made about a series of past traumas that have impacted  [name]'s since of worth. Counselor used CBT interventions to processed thoughts and feelings to inform her behaviors/reactions in the matter in the future. [name] identified that failed achievements are a trigger for her. She plans to journal and reflect on this concept and report back at next session.   We discussed discharge planning needs in light of upcoming leave for Counselor, with [name] stating that she would like to be transferred to another therapist and get involved in group treatment.  Assessment and Plan: Counselor will continue to meet with patient to address treatment plan goals. Patient will continue to follow recommendations of providers and implement skills learned in session.  Follow Up Instructions: Counselor will send information for next session via Webex.    The patient was advised to call back or seek an in-person evaluation if the symptoms worsen or if the condition fails to improve as anticipated.  I provided 55 minutes of non-face-to-face time during this encounter.   Lise Auer, LCSW

## 2019-09-11 ENCOUNTER — Other Ambulatory Visit (HOSPITAL_COMMUNITY): Payer: Self-pay

## 2019-09-13 ENCOUNTER — Encounter (INDEPENDENT_AMBULATORY_CARE_PROVIDER_SITE_OTHER): Payer: Self-pay | Admitting: Family Medicine

## 2019-09-13 ENCOUNTER — Ambulatory Visit (INDEPENDENT_AMBULATORY_CARE_PROVIDER_SITE_OTHER): Payer: BC Managed Care – PPO | Admitting: Family Medicine

## 2019-09-13 ENCOUNTER — Other Ambulatory Visit: Payer: Self-pay

## 2019-09-13 VITALS — BP 123/74 | HR 86 | Temp 98.3°F | Ht 64.0 in | Wt 333.0 lb

## 2019-09-13 DIAGNOSIS — R7303 Prediabetes: Secondary | ICD-10-CM

## 2019-09-13 DIAGNOSIS — Z9189 Other specified personal risk factors, not elsewhere classified: Secondary | ICD-10-CM | POA: Diagnosis not present

## 2019-09-13 DIAGNOSIS — Z6841 Body Mass Index (BMI) 40.0 and over, adult: Secondary | ICD-10-CM | POA: Diagnosis not present

## 2019-09-13 DIAGNOSIS — E559 Vitamin D deficiency, unspecified: Secondary | ICD-10-CM | POA: Diagnosis not present

## 2019-09-13 MED ORDER — VITAMIN D (ERGOCALCIFEROL) 1.25 MG (50000 UNIT) PO CAPS: 50000.0000 [IU] | ORAL_CAPSULE | ORAL | 0 refills | Status: DC

## 2019-09-13 MED ORDER — METFORMIN HCL 500 MG PO TABS: 500.0000 mg | ORAL_TABLET | Freq: Two times a day (BID) | ORAL | 0 refills | Status: DC

## 2019-09-14 NOTE — Progress Notes (Signed)
Chief Complaint:   OBESITY Christine [name] is here to discuss her progress with her obesity treatment plan along with follow-up of her obesity related diagnoses. Christine [name] is on keeping a food journal and adhering to recommended goals of 1600-1800 calories and 90 grams of protein daily and states she is following her eating plan approximately 50% of the time. Christine [name] states she is doing aerobics for 10 minutes 3 times per week.  Today's visit was #: 14 Starting weight: 337 lbs Starting date: 10/07/2018 Today's weight: 333 lbs Today's date: 09/13/2019 Total lbs lost to date: 4 Total lbs lost since last in-office visit: 4  Interim History: Christine [name] continues to do well with weight loss. She is journaling regularly the first half of the day, but not as much in the evenings when her husband cooks. Her protein level may be dropping.  Subjective:   1. Pre-diabetes Christine [name] is stable on metformin, and she is doing well with diet and weight loss. She denies nausea or vomiting.  2. Vitamin D deficiency Christine [name] is stable on Vit D.  3. At risk for nausea Christine [name] is at higher than average risk for nausea due to metformin and weight loss.  Assessment/Plan:   1. Pre-diabetes Christine [name] will continue to work on weight loss, diet, exercise, and decreasing simple carbohydrates to help decrease the risk of diabetes. We will refill metformin for 1 month.  - metFORMIN (GLUCOPHAGE) 500 MG tablet; Take 1 tablet (500 mg total) by mouth 2 (two) times daily with a meal.  Dispense: 60 tablet; Refill: 0  2. Vitamin D deficiency Low Vitamin D level contributes to fatigue and are associated with obesity, breast, and colon cancer. We will refill prescription Vitamin D for 1 month. Christine [name] will follow-up for routine testing of Vitamin D, at least 2-3 times per year to avoid over-replacement.  - Vitamin D, Ergocalciferol, (DRISDOL) 1.25 MG (50000 UNIT) CAPS capsule; Take 1 capsule (50,000 Units total) by mouth every 7 (seven) days.   Dispense: 4 capsule; Refill: 0  3. At risk for nausea Christine [name] was given approximately 15 minutes of nausea prevention counseling today. Christine [name] is at risk for nausea due to metformin and weight loss. She was encouraged to titrate her medication slowly, make sure to stay hydrated, eat smaller portions throughout the day, and avoid high fat meals.   4. Class 3 severe obesity with serious comorbidity and body mass index (BMI) of 50.0 to 59.9 in adult, unspecified obesity type (HCC) Christine [name] is currently in the action stage of change. As such, her goal is to continue with weight loss efforts. She has agreed to the Category 3 Plan or keeping a food journal and adhering to recommended goals of 1600-1800 calories and 90+ grams of protein daily.    Behavioral modification strategies: increasing lean protein intake and meal planning and cooking strategies.  Christine [name] has agreed to follow-up with our clinic in 3 weeks. She was informed of the importance of frequent follow-up visits to maximize her success with intensive lifestyle modifications for her multiple health conditions.   Objective:   Blood pressure 123/74, pulse 86, temperature 98.3 F (36.8 C), temperature source Oral, height 5\' 4"  (1.626 m), weight (!) 333 lb (151 kg), last menstrual period 08/25/2019, SpO2 98 %. Body mass index is 57.16 kg/m.  General: Cooperative, alert, well developed, in no acute distress. HEENT: Conjunctivae and lids unremarkable. Cardiovascular: Regular rhythm.  Lungs: Normal work of breathing. Neurologic: No focal deficits.   Lab Results  Component Value  Date   CREATININE 0.92 08/10/2019   BUN 13 08/10/2019   NA 138 08/10/2019   K 4.0 08/10/2019   CL 100 08/10/2019   CO2 28 08/10/2019   Lab Results  Component Value Date   ALT 26 08/07/2019   AST 16 08/07/2019   ALKPHOS 53 08/07/2019   BILITOT 0.6 08/07/2019   Lab Results  Component Value Date   HGBA1C 5.7 (H) 10/07/2018   Lab Results    Component Value Date   INSULIN 19.5 10/07/2018   Lab Results  Component Value Date   TSH 1.215 08/08/2019   Lab Results  Component Value Date   CHOL 183 10/07/2018   HDL 38 (L) 10/07/2018   LDLCALC 131 (H) 10/07/2018   TRIG 70 10/07/2018   Lab Results  Component Value Date   WBC 10.1 08/10/2019   HGB 12.0 08/10/2019   HCT 39.7 08/10/2019   MCV 99.5 08/10/2019   PLT 316 08/10/2019   Lab Results  Component Value Date   IRON 64 07/06/2019   TIBC 300 07/06/2019   FERRITIN 62 07/06/2019   Attestation Statements:   Reviewed by clinician on day of visit: allergies, medications, problem list, medical history, surgical history, family history, social history, and previous encounter notes.   I, Trixie Dredge, am acting as transcriptionist for Dennard Nip, MD.  I have reviewed the above documentation for accuracy and completeness, and I agree with the above. -  Dennard Nip, MD

## 2019-09-22 ENCOUNTER — Encounter (HOSPITAL_COMMUNITY): Payer: Self-pay | Admitting: Psychiatry

## 2019-09-22 ENCOUNTER — Other Ambulatory Visit: Payer: Self-pay

## 2019-09-22 ENCOUNTER — Ambulatory Visit (INDEPENDENT_AMBULATORY_CARE_PROVIDER_SITE_OTHER): Payer: Medicare Other | Admitting: Psychiatry

## 2019-09-22 DIAGNOSIS — F411 Generalized anxiety disorder: Secondary | ICD-10-CM

## 2019-09-22 DIAGNOSIS — F3162 Bipolar disorder, current episode mixed, moderate: Secondary | ICD-10-CM

## 2019-09-22 NOTE — Progress Notes (Signed)
Virtual Visit via Video Note  I connected with Christine [name] on 09/22/19 at  4:00 PM EST by a video enabled telemedicine application and verified that I am speaking with the correct person using two identifiers.  Location: Patient: Patient Home Provider: Home Office   I discussed the limitations of evaluation and management by telemedicine and the availability of in person appointments. The patient expressed understanding and agreed to proceed.  History of Present Illness: Bipolar 1 DO and GAD   Treatment Plan Goals: Christine [name] would like to feel less worthless by exploring thoughts/feelings/interests in therapy to find more meaning an purpose in life to alleviate depressive and anxiety symptoms.  Observations/Objective: Counselor met with Christine [name] for individual therapy via Webex. Counselor assessed MH symptoms and progress on treatment plan goals, with patient reporting increase in depressive symptoms, with frustration in inability to identify trigger. Christine [name] presents with moderate depression and moderate anxiety. Christine [name] denied suicidal ideation or self-harm behaviors.   Christine [name] shared that she has had a "low mood" over the past two weeks. In assessing potential triggers/factors Christine [name] was able to bring awareness to her change in medications and need to follow up with providers. She desires to no longer be on Lithium for management of Bipolar 1 DO and would like to address alternatives with provider. Counselor assessed other factors, with Christine [name] noting that she is in the process of starting a new venture and is experiencing insecurities and fears at taking the first steps and allowing herself to put action towards her ideas and dreams. Counselor utilized CBT and MI interventions to process thoughts and feelings, address negative, irrational cognitions, etc, with Christine [name] allowing herself to form new and more healthy self-talk.   Counselor and [name] discussed discharge planning needs. She would like  to remain internal with [facility] for therapy services. Counselor to connect [place] with one of our therapist for scheduling. Christine [name] expressed concerns and fears with start with a new provider and noted the need for continued therapy. Counselor validated her feelings and discussed strategies for a smooth transition.   Assessment and Plan: New Counselor will continue to meet with patient to address and reestablish treatment plan goals. Patient will continue to follow recommendations of providers and implement skills learned in session.  Follow Up Instructions: New Counselor will send information for next session via Webex.    The patient was advised to call back or seek an in-person evaluation if the symptoms worsen or if the condition fails to improve as anticipated.  I provided 55 minutes of non-face-to-face time during this encounter.   Lise Auer, LCSW

## 2019-09-25 ENCOUNTER — Other Ambulatory Visit (HOSPITAL_COMMUNITY)
Admission: RE | Admit: 2019-09-25 | Discharge: 2019-09-25 | Disposition: A | Discharge status: Home / Self Care | Payer: Medicare Other | Source: Ambulatory Visit | Attending: Neurology | Admitting: Neurology

## 2019-09-25 DIAGNOSIS — Z20822 Contact with and (suspected) exposure to covid-19: Secondary | ICD-10-CM | POA: Diagnosis not present

## 2019-09-25 DIAGNOSIS — Z01812 Encounter for preprocedural laboratory examination: Secondary | ICD-10-CM | POA: Diagnosis not present

## 2019-09-25 LAB — SARS CORONAVIRUS 2 (TAT 6-24 HRS): SARS Coronavirus 2: NEGATIVE

## 2019-09-27 NOTE — Progress Notes (Signed)
Negative covid 19 test.

## 2019-09-29 ENCOUNTER — Ambulatory Visit (INDEPENDENT_AMBULATORY_CARE_PROVIDER_SITE_OTHER): Payer: Medicare Other | Admitting: Neurology

## 2019-09-29 ENCOUNTER — Other Ambulatory Visit: Payer: Self-pay

## 2019-09-29 DIAGNOSIS — G4731 Primary central sleep apnea: Secondary | ICD-10-CM | POA: Diagnosis not present

## 2019-09-29 DIAGNOSIS — F418 Other specified anxiety disorders: Secondary | ICD-10-CM

## 2019-09-29 DIAGNOSIS — R000 Tachycardia, unspecified: Secondary | ICD-10-CM

## 2019-09-29 DIAGNOSIS — D649 Anemia, unspecified: Secondary | ICD-10-CM

## 2019-09-29 DIAGNOSIS — E669 Obesity, unspecified: Secondary | ICD-10-CM

## 2019-10-01 ENCOUNTER — Ambulatory Visit: Payer: Medicare Other | Admitting: Hematology

## 2019-10-01 ENCOUNTER — Other Ambulatory Visit: Payer: Medicare Other

## 2019-10-04 ENCOUNTER — Encounter (INDEPENDENT_AMBULATORY_CARE_PROVIDER_SITE_OTHER): Payer: Self-pay | Admitting: Family Medicine

## 2019-10-04 ENCOUNTER — Ambulatory Visit (INDEPENDENT_AMBULATORY_CARE_PROVIDER_SITE_OTHER): Payer: BC Managed Care – PPO | Admitting: Family Medicine

## 2019-10-04 ENCOUNTER — Other Ambulatory Visit: Payer: Self-pay

## 2019-10-04 VITALS — BP 131/81 | HR 95 | Temp 98.6°F | Ht 64.0 in | Wt 335.0 lb

## 2019-10-04 DIAGNOSIS — M25561 Pain in right knee: Secondary | ICD-10-CM

## 2019-10-04 DIAGNOSIS — Z6841 Body Mass Index (BMI) 40.0 and over, adult: Secondary | ICD-10-CM | POA: Diagnosis not present

## 2019-10-04 DIAGNOSIS — F319 Bipolar disorder, unspecified: Secondary | ICD-10-CM

## 2019-10-04 NOTE — Progress Notes (Signed)
Chief Complaint:   OBESITY Christine [name] is here to discuss her progress with her obesity treatment plan along with follow-up of her obesity related diagnoses. Christine [name] is on the Category 3 Plan or keeping a food journal and adhering to recommended goals of 1600-1800 calories and 90+ grams of protein daily and states she is following her eating plan approximately 20% of the time. Christine [name] states she is doing 0 minutes 0 times per week.  Today's visit was #: 15 Starting weight: 337 lbs Starting date: 10/07/2018 Today's weight: 335 lbs Today's date: 10/04/2019 Total lbs lost to date: 2 Total lbs lost since last in-office visit: 0  Interim History: Christine [name] has had a very rough last few weeks with medication changes and increased comfort eating, and decreased meal planning. She has been unable to exercise due to recent knee pain.  Subjective:   1. Bipolar 1 disorder ([place]) Christine [name] started having nausea when hydrochlorothiazide was added, and she found out the combination of this and her lithum could cause nausea so she stopped her litum. Her nausea has resolved but her mood has worsened, and she is tearful in the office. She denies suicidal ideas.  2. Right knee pain, unspecified chronicity Christine [name] is unable to exercise due to increased knee pain.  Assessment/Plan:   1. Bipolar 1 disorder ([place]) Christine [name] is to contact her Psychiatrist to let them know she stopped her lithum and to get advice.  2. Right knee pain, unspecified chronicity Christine [name] is ok to hold off on exercise until her pain improves or an evaluation is done to rule out patellofemoral.  3. Class 3 severe obesity with serious comorbidity and body mass index (BMI) of 50.0 to 59.9 in adult, unspecified obesity type (HCC) Christine [name] is currently in the action stage of change. As such, her goal is to continue with weight loss efforts. She has agreed to keeping a food journal and adhering to recommended goals of 1500-1800 calories and 100 grams of  protein daily.   Christine [name] is ok to use prepackaged foods for convenience as she works on other health issues for now.  Behavioral modification strategies: increasing lean protein intake and meal planning and cooking strategies.  Christine [name] has agreed to follow-up with our clinic in 2 to 3 weeks. She was informed of the importance of frequent follow-up visits to maximize her success with intensive lifestyle modifications for her multiple health conditions.   Objective:   Blood pressure 131/81, pulse 95, temperature 98.6 F (37 C), temperature source Oral, height 5\' 4"  (1.626 m), weight (!) 335 lb (152 kg), last menstrual period 09/19/2019, SpO2 98 %. Body mass index is 57.5 kg/m.  General: Cooperative, alert, well developed, in no acute distress. HEENT: Conjunctivae and lids unremarkable. Cardiovascular: Regular rhythm.  Lungs: Normal work of breathing. Neurologic: No focal deficits.   Lab Results  Component Value Date   CREATININE 0.92 08/10/2019   BUN 13 08/10/2019   NA 138 08/10/2019   K 4.0 08/10/2019   CL 100 08/10/2019   CO2 28 08/10/2019   Lab Results  Component Value Date   ALT 26 08/07/2019   AST 16 08/07/2019   ALKPHOS 53 08/07/2019   BILITOT 0.6 08/07/2019   Lab Results  Component Value Date   HGBA1C 5.7 (H) 10/07/2018   Lab Results  Component Value Date   INSULIN 19.5 10/07/2018   Lab Results  Component Value Date   TSH 1.215 08/08/2019   Lab Results  Component Value Date   CHOL 183  10/07/2018   HDL 38 (L) 10/07/2018   LDLCALC 131 (H) 10/07/2018   TRIG 70 10/07/2018   Lab Results  Component Value Date   WBC 10.1 08/10/2019   HGB 12.0 08/10/2019   HCT 39.7 08/10/2019   MCV 99.5 08/10/2019   PLT 316 08/10/2019   Lab Results  Component Value Date   IRON 64 07/06/2019   TIBC 300 07/06/2019   FERRITIN 62 07/06/2019   Attestation Statements:   Reviewed by clinician on day of visit: allergies, medications, problem list, medical history, surgical  history, family history, social history, and previous encounter notes.  Time spent on visit including pre-visit chart review and post-visit care and charting was 30 minutes.    I, Trixie Dredge, am acting as transcriptionist for Dennard Nip, MD.  I have reviewed the above documentation for accuracy and completeness, and I agree with the above. -  Dennard Nip, MD

## 2019-10-07 ENCOUNTER — Ambulatory Visit: Payer: Medicare Other | Admitting: Neurology

## 2019-10-12 ENCOUNTER — Telehealth (HOSPITAL_COMMUNITY): Payer: Self-pay | Admitting: *Deleted

## 2019-10-12 NOTE — Procedures (Signed)
PATIENT'S NAME:  Christine [name], Christine [name] DOB:      1976-01-17      MR#:    [id number]     DATE OF RECORDING: 09/29/2019 REFERRING M.D.:  Maude Leriche PA-C Study Performed:   SPLIT-Titration HISTORY:  Christine [name] returned for a revisit on 06 September 2019.  We had no contact since she finished her home sleep test on March 08, 2019.  She had been referred with a diagnosis of acute blood loss anemia, polycystic ovarian syndrome, essential hypertension, super obesity, chronic migraine, psychophysiological insomnia and inadequate sleep habits.  She was undergoing a home sleep test on watch Pat. She was diagnosed with severe sleep apnea and an AHI of 61.9/h minimum of third of all apneas were central in origin the rest dominantly obstructive. The heart rate was notably of high variability between tachy- and bradycardia slow and fast heart rhythm.   In the meantime, she was hospitalized in 08-09-2019 with congestive heart failure.  Her hospital physicians told her that she had CHF related to sleep apnea and that she needs to be treated urgently.    The patient was recommended to return urgently for a in lab titration but due to family obligations she was not able to return in time.   Epworth sleepiness score was today again endorsed at 13 points, fatigue severity at 44 points, both elevated and I would like for her to undergo this time a split-night sleep study we will use 2 hours of an attended polysomnography study to document again her high AHI and then convert to a CPAP titration the same night.  This kind of study protocol will require her to have Covid testing 3 days in advance.  The patient endorsed the Epworth Sleepiness Scale at 13 points and the Fatigue Score at 44- points.   The patient's weight 337 pounds with a height of 65 (inches), resulting in a BMI of 56.2 kg/m2. The patient's neck circumference measured 15.5 inches.  CURRENT MEDICATIONS: Toprol-XL, Norvasc, Abilify, Klonopin, Flonase, Microzide,  Lamictal, Lithium, Glucophage, Vitamin D,  PROCEDURE:  This is a multichannel digital polysomnogram utilizing the SomnoStar 11.2 system.  Electrodes and sensors were applied and monitored per AASM Specifications.   EEG, EOG, Chin and Limb EMG, were sampled at 200 Hz.  ECG, Snore and Nasal Pressure, Thermal Airflow, Respiratory Effort, CPAP Flow and Pressure, Oximetry was sampled at 50 Hz. Digital video and audio were recorded.      The patient slept for 120 minutes without CPAP initiation and reached an AHI of 79.2/h.  The patient was fitted with a ResMed P 10 nasal pillow in medium as a mask. CPAP was initiated at 5 cmH20 with heated humidity per AASM split night standards and pressure was advanced to 13 cmH20 because of hypopneas, apneas and desaturations.  At any PAP pressure was there a residual AHI of 10/h or higher. A change to BiPAP was attempted and the patient was unable to initiate and sustain sleep under BiPAP.  The lowest AHI was reached under 14/10 cm BiPAP but only 5 minutes of sleep were recorded. No more snoring and only one bathroom break were noted.   Lights Out was at 22:31 and Lights On at 04:59. Total recording time (TRT) was 171.5 minutes, with a total sleep time (TST) of 70.5 minutes. The patient's sleep latency was 62.5 minutes. REM latency was 0 minutes.  The sleep efficiency was poor at only 41.1 %.    SLEEP ARCHITECTURE: WASO (Wake after sleep onset) was  at 149.5 minutes(!).  There were 82 minutes in Stage N1, 107 minutes Stage N2, 2 minutes Stage N3 and 0 minutes in Stage REM.  The percentage of Stage N1 was 42.9%, Stage N2 was 56.%, Stage N3 was 1.% and Stage R (REM sleep) was 0%.   RESPIRATORY ANALYSIS:  There was a total of 19 respiratory events: 0 obstructive apneas, 0 central apneas and 0 mixed apneas with a total of 0 apneas and an apnea index (AI) of 0 /hour. There were 19 hypopneas with a hypopnea index of 16.2/hour.  The total APNEA/HYPOPNEA INDEX  (AHI) was 16.2  /hour .0 events occurred in REM sleep and 19 events in NREM. The REM AHI was 0 /hour versus a non-REM AHI of 16.2 /hour.  The patient spent 31 minutes of total sleep time in the supine position and 160 minutes in non-supine. The supine AHI was 10.9, versus a non-supine AHI of 17.8/h.  OXYGEN SATURATION & C02:  The baseline 02 saturation was 95%, with the lowest being 85%. Time spent below 89% saturation equaled 29 minutes. The arousals were noted as: 23 were spontaneous, 0 were associated with PLMs, 46 were associated with respiratory events The patient had a total of 0 Periodic Limb Movements.  Audio and video analysis did not show any abnormal or unusual movements, behaviors, phonations or vocalizations.  The patient took one bathroom break. Snoring was eliminated under CPAP  EKG was in keeping with normal sinus rhythm (NSR).  DIAGNOSIS 1. Severe Obstructive Sleep Apnea was significantly reduced under CPAP and BiPAP pressure, but the patient was unable to sleep.  2. No Central Sleep Apnea emerged under treatment.  3. PAP therapy completely alleviated Sleep Related Hypoxemia.  PLANS/RECOMMENDATIONS: The patient was fitted with a ResMed P 10 nasal pillow in medium as a mask. I will write for an autotitration BiPAP with a pressure spread of 4 cm water from 10/6 through 15/11 cm water pressure.  1. Any apnea patient should avoid sedatives, hypnotics, and alcohol consumption. 2. PAP therapy compliance is defined as 4 hours or more of nightly use. A follow up appointment will be scheduled in the Sleep Clinic at [facility].   Please call [phone] with any questions.    I certify that I have reviewed the entire raw data recording prior to the issuance of this report in accordance with the Standards of Accreditation of the American Academy of Sleep Medicine (AASM)   Larey Seat, M.D. Diplomat, Tax adviser of Psychiatry and Neurology  Diplomat, Tax adviser of Sleep  Medicine Market researcher, [employer] [facility] at [employer]

## 2019-10-12 NOTE — Progress Notes (Signed)
DIAGNOSIS  1. Severe Obstructive Sleep Apnea was significantly reduced under  CPAP and BiPAP pressure, but the patient was unable to sleep.  2. No Central Sleep Apnea emerged under treatment.  3. PAP therapy completely alleviated Sleep Related Hypoxemia.   PLANS/RECOMMENDATIONS: The patient was fitted with a ResMed P 10  nasal pillow in medium as a mask.  I will write for an autotitration BiPAP with a pressure spread of  4 cm water from 10/6 through 15/11 cm water pressure.  1. Any apnea patient should avoid sedatives, hypnotics, and  alcohol consumption.  2. PAP therapy compliance is defined as 4 hours or more of  nightly use.

## 2019-10-12 NOTE — Telephone Encounter (Signed)
Nurse received VM from pt stating that she has recently started on [place] and feels this was "messing with" her Lithium. Pt describes decompensating since off the Lithium and would like advice on med management. Pt also mentioned [facility] results. Please review and advise. Pt next appointment is on 10/20/19.

## 2019-10-12 NOTE — Addendum Note (Signed)
Addended by: Larey Seat on: 10/12/2019 05:46 PM   Modules accepted: Orders

## 2019-10-13 ENCOUNTER — Encounter: Payer: Self-pay | Admitting: Neurology

## 2019-10-13 ENCOUNTER — Telehealth: Payer: Self-pay | Admitting: Neurology

## 2019-10-13 NOTE — Telephone Encounter (Signed)
I called pt. I advised pt that Dr. Brett Fairy reviewed their sleep study results and found that pt was best treated under BiPAP. Dr. Brett Fairy recommends that pt starts auto BiPAP. I reviewed PAP compliance expectations with the pt. Pt is agreeable to starting a CPAP. I advised pt that an order will be sent to a DME, [facility], and [facility] will call the pt within about one week after they file with the pt's insurance. [facility] will show the pt how to use the machine, fit for masks, and troubleshoot the CPAP if needed. A follow up appt was made for insurance purposes with Ward Givens, NP on June 7, 8:30 am. Pt verbalized understanding to arrive 15 minutes early and bring their CPAP. A letter with all of this information in it will be mailed to the pt as a reminder. I verified with the pt that the address we have on file is correct. Pt verbalized understanding of results. Pt had no questions at this time but was encouraged to call back if questions arise. I have sent the order to [facility] and have received confirmation that they have received the order.

## 2019-10-13 NOTE — Telephone Encounter (Signed)
I do not know who recommended her to stop the lithium.  Need to clarify what is her mean by messing up with lithium and hydrochlorothiazide.  I believe she should go back to lithium until her next appointment on March 17.  I check her lithium level is very low.

## 2019-10-13 NOTE — Telephone Encounter (Signed)
-----   Message from Larey Seat, MD sent at 10/12/2019  5:46 PM EST ----- DIAGNOSIS  1. Severe Obstructive Sleep Apnea was significantly reduced under  CPAP and BiPAP pressure, but the patient was unable to sleep.  2. No Central Sleep Apnea emerged under treatment.  3. PAP therapy completely alleviated Sleep Related Hypoxemia.   PLANS/RECOMMENDATIONS: The patient was fitted with a ResMed P 10  nasal pillow in medium as a mask.  I will write for an autotitration BiPAP with a pressure spread of  4 cm water from 10/6 through 15/11 cm water pressure.  1. Any apnea patient should avoid sedatives, hypnotics, and  alcohol consumption.  2. PAP therapy compliance is defined as 4 hours or more of  nightly use.

## 2019-10-13 NOTE — Telephone Encounter (Signed)
Attempted to call the patient to review SSR. There was no answer and her VM is full. I will attempt a [facility] message as well.

## 2019-10-20 ENCOUNTER — Encounter (HOSPITAL_COMMUNITY): Payer: Self-pay | Admitting: Psychiatry

## 2019-10-20 ENCOUNTER — Encounter (INDEPENDENT_AMBULATORY_CARE_PROVIDER_SITE_OTHER): Payer: Self-pay | Admitting: Family Medicine

## 2019-10-20 ENCOUNTER — Ambulatory Visit (INDEPENDENT_AMBULATORY_CARE_PROVIDER_SITE_OTHER): Payer: Medicare Other | Admitting: Psychiatry

## 2019-10-20 ENCOUNTER — Other Ambulatory Visit: Payer: Self-pay

## 2019-10-20 ENCOUNTER — Ambulatory Visit (INDEPENDENT_AMBULATORY_CARE_PROVIDER_SITE_OTHER): Payer: BC Managed Care – PPO | Admitting: Family Medicine

## 2019-10-20 VITALS — BP 122/77 | HR 86 | Temp 98.4°F | Ht 64.0 in | Wt 334.0 lb

## 2019-10-20 DIAGNOSIS — F3162 Bipolar disorder, current episode mixed, moderate: Secondary | ICD-10-CM | POA: Diagnosis not present

## 2019-10-20 DIAGNOSIS — F411 Generalized anxiety disorder: Secondary | ICD-10-CM | POA: Diagnosis not present

## 2019-10-20 DIAGNOSIS — Z6841 Body Mass Index (BMI) 40.0 and over, adult: Secondary | ICD-10-CM

## 2019-10-20 DIAGNOSIS — E559 Vitamin D deficiency, unspecified: Secondary | ICD-10-CM

## 2019-10-20 DIAGNOSIS — Z9189 Other specified personal risk factors, not elsewhere classified: Secondary | ICD-10-CM

## 2019-10-20 DIAGNOSIS — R7303 Prediabetes: Secondary | ICD-10-CM | POA: Diagnosis not present

## 2019-10-20 MED ORDER — CLONAZEPAM 0.5 MG PO TABS: 0.5000 mg | ORAL_TABLET | Freq: Two times a day (BID) | ORAL | 1 refills | Status: DC

## 2019-10-20 MED ORDER — LAMOTRIGINE 150 MG PO TABS: 150.0000 mg | ORAL_TABLET | Freq: Two times a day (BID) | ORAL | 1 refills | Status: DC

## 2019-10-20 MED ORDER — LITHIUM CARBONATE ER 450 MG PO TBCR: 450.0000 mg | EXTENDED_RELEASE_TABLET | Freq: Every day | ORAL | 1 refills | Status: DC

## 2019-10-20 MED ORDER — METFORMIN HCL 500 MG PO TABS: 500.0000 mg | ORAL_TABLET | Freq: Two times a day (BID) | ORAL | 0 refills | Status: AC

## 2019-10-20 MED ORDER — ARIPIPRAZOLE 15 MG PO TABS: 15.0000 mg | ORAL_TABLET | Freq: Every day | ORAL | 1 refills | Status: DC

## 2019-10-20 MED ORDER — VITAMIN D (ERGOCALCIFEROL) 1.25 MG (50000 UNIT) PO CAPS: 50000.0000 [IU] | ORAL_CAPSULE | ORAL | 0 refills | Status: DC

## 2019-10-20 NOTE — Progress Notes (Signed)
Virtual Visit via Telephone Note  I connected with Christine [name] on 10/20/19 at  8:20 AM EDT by telephone and verified that I am speaking with the correct person using two identifiers.   I discussed the limitations, risks, security and privacy concerns of performing an evaluation and management service by telephone and the availability of in person appointments. I also discussed with the patient that there may be a patient responsible charge related to this service. The patient expressed understanding and agreed to proceed.   History of Present Illness: Patient was evaluated by phone session.  She endorsed lately feeling more sad and depressed.  She was admitted on the medical floor because of shortness of breath.  Not she is taking hydrochlorothiazide and she noticed since then she was having nausea and she discussed with the pharmacist who told it may be causing interaction with lithium.  She had decided to stop the lithium and since then she noticed her mood is down.  She occasionally having crying spells and sadness.  She is not sleeping as good.  She supposed to have CPAP but she is still waiting for the machine.  She is not in therapy with Romelle Starcher because of her maternity leave.  She is not sure what other factors can cause her depression since things are going okay with the family.  She lives with her 2 children and her husband who is supportive.  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  Patient admitted sometimes irritability and mood swings but they are not as bad.  She is compliant with Lamictal, Abilify, Klonopin.  She admitted feeling tired and try to lose weight but sometimes she has no desire to do exercise.  She denies drinking or using any illegal substances.  Past Psychiatric History:Reviewed. H/Odepression since age 30. Antidepressant mademanic. SawDr. [name] and tried Risperdal, Seroquel, Abilify, lithium, Prozac, Paxil, Zoloft, Lexapro, Celexa, Effexor and  Klonopin.H/Omania and impulsive behavior. We tried [name]. H/O multiple inpatient, PHP andIOP. No h/osuicidal attempt. Last inpatientin November 2017.  Recent Results (from the past 2160 hour(s))  CBC with Differential     Status: Abnormal   Collection Time: 08/07/19  2:24 PM  Result Value Ref Range   WBC 9.3 4.0 - 10.5 K/uL   RBC 3.87 3.87 - 5.11 MIL/uL   Hemoglobin 11.6 (L) 12.0 - 15.0 g/dL   HCT 37.7 36.0 - 46.0 %   MCV 97.4 80.0 - 100.0 fL   MCH 30.0 26.0 - 34.0 pg   MCHC 30.8 30.0 - 36.0 g/dL   RDW 15.3 11.5 - 15.5 %   Platelets 285 150 - 400 K/uL   nRBC 0.2 0.0 - 0.2 %   Neutrophils Relative % 73 %   Neutro Abs 6.7 1.7 - 7.7 K/uL   Lymphocytes Relative 16 %   Lymphs Abs 1.5 0.7 - 4.0 K/uL   Monocytes Relative 8 %   Monocytes Absolute 0.7 0.1 - 1.0 K/uL   Eosinophils Relative 1 %   Eosinophils Absolute 0.1 0.0 - 0.5 K/uL   Basophils Relative 0 %   Basophils Absolute 0.0 0.0 - 0.1 K/uL   Immature Granulocytes 2 %   Abs Immature Granulocytes 0.19 (H) 0.00 - 0.07 K/uL    Comment: Performed at [facility], [place] [address] [place], [place] [address]  Basic metabolic panel     Status: None   Collection Time: 08/07/19  2:24 PM  Result Value Ref Range   Sodium 140 135 - 145 mmol/L  Potassium 3.7 3.5 - 5.1 mmol/L   Chloride 105 98 - 111 mmol/L   CO2 25 22 - 32 mmol/L   Glucose, Bld 96 70 - 99 mg/dL   BUN 10 6 - 20 mg/dL   Creatinine, Ser 0.78 0.44 - 1.00 mg/dL   Calcium 9.4 8.9 - 10.3 mg/dL   GFR calc non Af Amer >60 >60 mL/min   GFR calc Af Amer >60 >60 mL/min   Anion gap 10 5 - 15    Comment: Performed at [facility], [place] [address], [place], [place] [address]  Troponin I (High Sensitivity)     Status: None   Collection Time: 08/07/19  2:24 PM  Result Value Ref Range   Troponin I (High Sensitivity) 2 <18 ng/L    Comment: (NOTE) Elevated high sensitivity troponin I (hsTnI) values and significant  changes across serial  measurements may suggest ACS but many other  chronic and acute conditions are known to elevate hsTnI results.  Refer to the "Links" section for chest pain algorithms and additional  guidance. Performed at [facility], [place] [address] [place], [place] [address]   D-dimer, quantitative (not at [facility])     Status: None   Collection Time: 08/07/19  2:24 PM  Result Value Ref Range   D-Dimer, Quant 0.50 0.00 - 0.50 ug/mL-FEU    Comment: (NOTE) At the manufacturer cut-off of 0.50 ug/mL FEU, this assay has been documented to exclude PE with a sensitivity and negative predictive value of 97 to 99%.  At this time, this assay has not been approved by the FDA to exclude DVT/VTE. Results should be correlated with clinical presentation. Performed at [facility], [place] [address], [place], [place] [address]   Hepatic function panel     Status: None   Collection Time: 08/07/19  2:24 PM  Result Value Ref Range   Total Protein 7.5 6.5 - 8.1 g/dL   Albumin 4.2 3.5 - 5.0 g/dL   AST 16 15 - 41 U/L   ALT 26 0 - 44 U/L   Alkaline Phosphatase 53 38 - 126 U/L   Total Bilirubin 0.6 0.3 - 1.2 mg/dL   Bilirubin, Direct <0.1 0.0 - 0.2 mg/dL   Indirect Bilirubin NOT CALCULATED 0.3 - 0.9 mg/dL    Comment: Performed at [facility], [place] [address], [place], [place] [address]  Brain natriuretic peptide     Status: None   Collection Time: 08/07/19  2:24 PM  Result Value Ref Range   B Natriuretic Peptide 16.1 0.0 - 100.0 pg/mL    Comment: Performed at [facility], [place] [address], [place], [place] [address]  I-Stat beta hCG blood, ED     Status: None   Collection Time: 08/07/19  2:33 PM  Result Value Ref Range   I-stat hCG, quantitative <5.0 <5 mIU/mL   Comment 3            Comment:   GEST. AGE      CONC.  (mIU/mL)   <=1 WEEK        5 - 50     2 WEEKS       50 - 500     3 WEEKS       100 - 10,000     4 WEEKS     1,000 - 30,000         FEMALE AND NON-PREGNANT FEMALE:     LESS THAN 5 mIU/mL  POC SARS Coronavirus 2 Ag-ED - Nasal Swab (BD Veritor Kit)     Status: None   Collection Time: 08/07/19  2:47 PM  Result Value Ref Range   SARS Coronavirus 2 Ag NEGATIVE NEGATIVE    Comment: (NOTE) SARS-CoV-2 antigen NOT DETECTED.  Negative results are presumptive.  Negative results do not preclude SARS-CoV-2 infection and should not be used as the sole basis for treatment or other patient management decisions, including infection  control decisions, particularly in the presence of clinical signs and  symptoms consistent with COVID-19, or in those who have been in contact with the virus.  Negative results must be combined with clinical observations, patient history, and epidemiological information. The expected result is Negative. Fact Sheet for Patients: PodPark.tn Fact Sheet for Healthcare Providers: GiftContent.is This test is not yet approved or cleared by the [place] FDA and  has been authorized for detection and/or diagnosis of SARS-CoV-2 by FDA under an Emergency Use Authorization (EUA).  This EUA will remain in effect (meaning this test can be used) for the duration of  the COVID-19 de claration under Section 564(b)(1) of the Act, 21 U.S.C. section 360bbb-3(b)(1), unless the authorization is terminated or revoked sooner.   Troponin I (High Sensitivity)     Status: None   Collection Time: 08/07/19  4:18 PM  Result Value Ref Range   Troponin I (High Sensitivity) 2 <18 ng/L    Comment: (NOTE) Elevated high sensitivity troponin I (hsTnI) values and significant  changes across serial measurements may suggest ACS but many other  chronic and acute conditions are known to elevate hsTnI results.  Refer to the "Links" section for chest pain algorithms and additional  guidance. Performed at [facility], [place] [address] [place], [place]  [address]   SARS CORONAVIRUS 2 (TAT 6-24 HRS) Nasopharyngeal Nasopharyngeal Swab     Status: None   Collection Time: 08/07/19  4:18 PM   Specimen: Nasopharyngeal Swab  Result Value Ref Range   SARS Coronavirus 2 NEGATIVE NEGATIVE    Comment: (NOTE) SARS-CoV-2 target nucleic acids are NOT DETECTED. The SARS-CoV-2 RNA is generally detectable in upper and lower respiratory specimens during the acute phase of infection. Negative results do not preclude SARS-CoV-2 infection, do not rule out co-infections with other pathogens, and should not be used as the sole basis for treatment or other patient management decisions. Negative results must be combined with clinical observations, patient history, and epidemiological information. The expected result is Negative. Fact Sheet for Patients: SugarRoll.be Fact Sheet for Healthcare Providers: https://www.woods-mathews.com/ This test is not yet approved or cleared by the [place] FDA and  has been authorized for detection and/or diagnosis of SARS-CoV-2 by FDA under an Emergency Use Authorization (EUA). This EUA will remain  in effect (meaning this test can be used) for the duration of the COVID-19 declaration under Section 56 4(b)(1) of the Act, 21 U.S.C. section 360bbb-3(b)(1), unless the authorization is terminated or revoked sooner. Performed at [facility], [place] [address] [place], [place] [address]   HIV Antibody (routine testing w rflx)     Status: None   Collection Time: 08/07/19 10:33 PM  Result Value Ref Range   HIV Screen 4th Generation wRfx NON REACTIVE NON REACTIVE    Comment: Performed at [facility], [place] [address], [place], [place] [address]  Lithium level     Status: Abnormal   Collection Time: 08/07/19 10:35 PM  Result Value Ref Range   Lithium Lvl  0.12 (L) 0.60 - 1.20 mmol/L    Comment: Performed at [facility], [place] [address] [place], [place]  [address]  Urinalysis, Routine w reflex microscopic     Status: Abnormal   Collection Time: 08/07/19 11:25 PM  Result Value Ref Range   Color, Urine STRAW (A) YELLOW   APPearance CLEAR CLEAR   Specific Gravity, Urine 1.011 1.005 - 1.030   pH 6.0 5.0 - 8.0   Glucose, UA NEGATIVE NEGATIVE mg/dL   Hgb urine dipstick NEGATIVE NEGATIVE   Bilirubin Urine NEGATIVE NEGATIVE   Ketones, ur NEGATIVE NEGATIVE mg/dL   Protein, ur NEGATIVE NEGATIVE mg/dL   Nitrite NEGATIVE NEGATIVE   Leukocytes,Ua NEGATIVE NEGATIVE    Comment: Performed at [place] [address], [place], [place] [address]  TSH     Status: None   Collection Time: 08/08/19  6:41 AM  Result Value Ref Range   TSH 1.215 0.350 - 4.500 uIU/mL    Comment: Performed by a 3rd Generation assay with a functional sensitivity of <=0.01 uIU/mL. Performed at [facility], [place] [address] [place], [place] [address]   Basic metabolic panel     Status: Abnormal   Collection Time: 08/08/19  6:41 AM  Result Value Ref Range   Sodium 139 135 - 145 mmol/L   Potassium 3.8 3.5 - 5.1 mmol/L   Chloride 105 98 - 111 mmol/L   CO2 26 22 - 32 mmol/L   Glucose, Bld 104 (H) 70 - 99 mg/dL   BUN 10 6 - 20 mg/dL   Creatinine, Ser 0.79 0.44 - 1.00 mg/dL   Calcium 9.4 8.9 - 10.3 mg/dL   GFR calc non Af Amer >60 >60 mL/min   GFR calc Af Amer >60 >60 mL/min   Anion gap 8 5 - 15    Comment: Performed at [facility], [place] [address], [place], [place] [address]  CBC     Status: Abnormal   Collection Time: 08/08/19  6:41 AM  Result Value Ref Range   WBC 8.3 4.0 - 10.5 K/uL   RBC 3.80 (L) 3.87 - 5.11 MIL/uL   Hemoglobin 11.4 (L) 12.0 - 15.0 g/dL   HCT 37.5 36.0 - 46.0 %   MCV 98.7 80.0 - 100.0 fL   MCH 30.0 26.0 - 34.0 pg   MCHC 30.4 30.0 - 36.0 g/dL   RDW 15.9 (H) 11.5 - 15.5 %   Platelets 296 150 - 400 K/uL   nRBC 0.0 0.0 - 0.2 %    Comment: Performed at [facility], [place]  [address] [place], [place] [address]  Basic metabolic panel     Status: Abnormal   Collection Time: 08/09/19  5:42 AM  Result Value Ref Range   Sodium 138 135 - 145 mmol/L   Potassium 3.9 3.5 - 5.1 mmol/L   Chloride 102 98 - 111 mmol/L   CO2 29 22 - 32 mmol/L   Glucose, Bld 109 (H) 70 - 99 mg/dL   BUN 12 6 - 20 mg/dL   Creatinine, Ser 0.93 0.44 - 1.00 mg/dL   Calcium 9.5 8.9 - 10.3 mg/dL   GFR calc non Af Amer >60 >60 mL/min   GFR calc Af Amer >60 >60 mL/min   Anion gap 7 5 - 15    Comment: Performed at [facility], [place] [address], [place],  [address]  CBC     Status: Abnormal   Collection Time: 08/09/19  5:42 AM  Result Value Ref Range   WBC 9.3 4.0 - 10.5 K/uL   RBC 3.80 (L) 3.87 - 5.11 MIL/uL   Hemoglobin 11.3 (L) 12.0 - 15.0 g/dL   HCT 37.8 36.0 - 46.0 %   MCV 99.5 80.0 - 100.0 fL   MCH 29.7 26.0 - 34.0 pg   MCHC 29.9 (L) 30.0 - 36.0 g/dL   RDW 15.9 (H) 11.5 - 15.5 %   Platelets 286 150 - 400 K/uL   nRBC 0.0 0.0 - 0.2 %    Comment: Performed at [facility], [place] [address], [place], [place] [address]  ECHOCARDIOGRAM COMPLETE     Status: None   Collection Time: 08/09/19 12:13 PM  Result Value Ref Range   Weight 5,573.23 oz   Height 64 in   BP 127/71 mmHg  Basic metabolic panel     Status: Abnormal   Collection Time: 08/10/19  5:14 AM  Result Value Ref Range   Sodium 138 135 - 145 mmol/L   Potassium 4.0 3.5 - 5.1 mmol/L   Chloride 100 98 - 111 mmol/L   CO2 28 22 - 32 mmol/L   Glucose, Bld 102 (H) 70 - 99 mg/dL   BUN 13 6 - 20 mg/dL   Creatinine, Ser 0.92 0.44 - 1.00 mg/dL   Calcium 9.5 8.9 - 10.3 mg/dL   GFR calc non Af Amer >60 >60 mL/min   GFR calc Af Amer >60 >60 mL/min   Anion gap 10 5 - 15    Comment: Performed at [facility], [place] [address] [place], [place] [address]  CBC     Status: Abnormal   Collection Time: 08/10/19  5:14 AM  Result Value Ref Range   WBC 10.1 4.0 - 10.5 K/uL   RBC 3.99  3.87 - 5.11 MIL/uL   Hemoglobin 12.0 12.0 - 15.0 g/dL   HCT 39.7 36.0 - 46.0 %   MCV 99.5 80.0 - 100.0 fL   MCH 30.1 26.0 - 34.0 pg   MCHC 30.2 30.0 - 36.0 g/dL   RDW 15.6 (H) 11.5 - 15.5 %   Platelets 316 150 - 400 K/uL   nRBC 0.0 0.0 - 0.2 %    Comment: Performed at [facility], [place] [address], [place], [place] [address]  SARS CORONAVIRUS 2 (TAT 6-24 HRS) Nasopharyngeal Nasopharyngeal Swab     Status: None   Collection Time: 09/25/19  5:39 PM   Specimen: Nasopharyngeal Swab  Result Value Ref Range   SARS Coronavirus 2 NEGATIVE NEGATIVE    Comment: (NOTE) SARS-CoV-2 target nucleic acids are NOT DETECTED. The SARS-CoV-2 RNA is generally detectable in upper and lower respiratory specimens during the acute phase of infection. Negative results do not preclude SARS-CoV-2 infection, do not rule out co-infections with other pathogens, and should not be used as the sole basis for treatment or other patient management decisions. Negative results must be combined with clinical observations, patient history, and epidemiological information. The expected result is Negative. Fact Sheet for Patients: SugarRoll.be Fact Sheet for Healthcare Providers: https://www.woods-mathews.com/ This test is not yet approved or cleared by the [place] FDA and  has been authorized for detection and/or diagnosis of SARS-CoV-2 by FDA under an Emergency Use Authorization (EUA). This EUA will remain  in effect (meaning this test can be used) for the duration of the COVID-19 declaration under Section 56 4(b)(1) of the Act, 21 U.S.C. section 360bbb-3(b)(1), unless the authorization is terminated or revoked sooner.  Performed at [facility], [place] [address] [place], [place] [address]       Psychiatric Specialty Exam: Physical Exam  Review of Systems  There were no vitals taken for this visit.There is no height or weight on file to  calculate BMI.  General Appearance: NA  Eye Contact:  NA  Speech:  Slow  Volume:  Decreased  Mood:  Depressed  Affect:  NA  Thought Process:  Descriptions of Associations: Intact  Orientation:  Full (Time, Place, and Person)  Thought Content:  Rumination  Suicidal Thoughts:  No  Homicidal Thoughts:  No  Memory:  Immediate;   Good Recent;   Good Remote;   Good  Judgement:  Intact  Insight:  Present  Psychomotor Activity:  NA  Concentration:  Concentration: Fair and Attention Span: Fair  Recall:  Good  Fund of Knowledge:  Good  Language:  Good  Akathisia:  No  Handed:  Right  AIMS (if indicated):     Assets:  Communication Skills Desire for Improvement Resilience Social Support  ADL's:  Intact  Cognition:  WNL  Sleep:         Assessment and Plan: Bipolar disorder type I.  Generalized anxiety disorder.  I reviewed her blood work results and current medication.  Patient had stopped the lithium and noticed since then more depressed and sad.  Recently hydrochlorothiazide was added to relief her fluid.  She started to have nausea with the lithium and hydrochlorothiazide.  Her nausea is now subsided.  We have tried in the past multiple medication and usually antidepressant make her manic.  I recommend that she can try low-dose lithium to take at bedtime if nausea persists.  Usually low-dose lithium does not cause nausea.  She really feels that lithium was helping her a lot and she is trying to see if she can go back to low-dose lithium.  I agree with the plan.  The other option is increasing Abilify but given the history of diabetes I will defer that.  I will also schedule appointment to see a therapist since Romelle Starcher is on maternity leave in the meantime she can see a different therapist.  She agreed.  Continue Lamictal 300 mg daily, Abilify 15 mg at bedtime, Klonopin 0.5 mg twice a day as needed and we will start lithium 450 mg only at bedtime.  I also discussed if she has a nausea  then she call us back but recommend to take at bedtime.  She has no rash or any itching.  Discussed medication side effect and benefits.  Discussed safety concerns and anytime having active suicidal thoughts or homicidal thought then she need to call [phone] one of the local emergency room.  Follow Up Instructions:    I discussed the assessment and treatment plan with the patient. The patient was provided an opportunity to ask questions and all were answered. The patient agreed with the plan and demonstrated an understanding of the instructions.   The patient was advised to call back or seek an in-person evaluation if the symptoms worsen or if the condition fails to improve as anticipated.  I provided 25 minutes of non-face-to-face time during this encounter.   Kathlee Nations, MD

## 2019-10-21 NOTE — Progress Notes (Signed)
Chief Complaint:   OBESITY Christine [name] is here to discuss her progress with her obesity treatment plan along with follow-up of her obesity related diagnoses. Christine [name] is on keeping a food journal and adhering to recommended goals of 1500-1800 calories and 100 grams of protein daily and states she is following her eating plan approximately 10% of the time. Christine [name] states she is doing 0 minutes 0 times per week.  Today's visit was #: 29 Starting weight: 337 lbs Starting date: 10/07/2018 Today's weight: 334 lbs Today's date: 10/20/2019 Total lbs lost to date: 3 Total lbs lost since last in-office visit: 1  Interim History: Christine [name] recently saw her Psychiatrist and was re-started on a lower dose of lithium in the evening to help with modd stabilization (ie, mania). She continues to experience daily depression that makes meal prepping a challenge. Her children will be home from school at the end of the month for Spring break which she is looking forward to.  Subjective:   1. Vitamin D deficiency Christine [name] is on prescription Vit D 50,000 IU once weekly.  2. Pre-diabetes Christine [name] reports she is tolerating metformin. She denies GI upset, and she denies episodes of hypoglycemia.  3. At risk for impaired metabolic function Christine [name] is at increased risk for impaired metabolic function due to low protein intake.  Assessment/Plan:   1. Vitamin D deficiency Low Vitamin D level contributes to fatigue and are associated with obesity, breast, and colon cancer. We will refill prescription Vitamin D for 1 month. Christine [name] will follow-up for routine testing of Vitamin D, at least 2-3 times per year to avoid over-replacement.  - Vitamin D, Ergocalciferol, (DRISDOL) 1.25 MG (50000 UNIT) CAPS capsule; Take 1 capsule (50,000 Units total) by mouth every 7 (seven) days.  Dispense: 4 capsule; Refill: 0  2. Pre-diabetes Christine [name] will continue to work on weight loss, exercise, and decreasing simple carbohydrates to help  decrease the risk of diabetes. We will refill metformin for 1 month.  - metFORMIN (GLUCOPHAGE) 500 MG tablet; Take 1 tablet (500 mg total) by mouth 2 (two) times daily with a meal.  Dispense: 60 tablet; Refill: 0  3. At risk for impaired metabolic function Christine [name] was given approximately 15 minutes of impaired  metabolic function prevention counseling today. We discussed intensive lifestyle modifications today with an emphasis on specific nutrition and exercise instructions and strategies.   Repetitive spaced learning was employed today to elicit superior memory formation and behavioral change.  4. Class 3 severe obesity with serious comorbidity and body mass index (BMI) of 50.0 to 59.9 in adult, unspecified obesity type (HCC) Christine [name] is currently in the action stage of change. As such, her goal is to continue with weight loss efforts. She has agreed to keeping a food journal and adhering to recommended goals of 1500-1800 calories and 100 grams of protein daily.   Behavioral modification strategies: meal planning and cooking strategies, better snacking choices and holiday eating strategies (Spring break).  Christine [name] has agreed to follow-up with our clinic in 3 to 4 weeks. She was informed of the importance of frequent follow-up visits to maximize her success with intensive lifestyle modifications for her multiple health conditions.   Objective:   Blood pressure 122/77, pulse 86, temperature 98.4 F (36.9 C), temperature source Oral, height 5\' 4"  (1.626 m), weight (!) 334 lb (151.5 kg), last menstrual period 10/15/2019, SpO2 98 %. Body mass index is 57.33 kg/m.  General: Cooperative, alert, well developed, in no acute distress. HEENT: Conjunctivae and lids  unremarkable. Cardiovascular: Regular rhythm.  Lungs: Normal work of breathing. Neurologic: No focal deficits.   Lab Results  Component Value Date   CREATININE 0.92 08/10/2019   BUN 13 08/10/2019   NA 138 08/10/2019   K 4.0 08/10/2019     CL 100 08/10/2019   CO2 28 08/10/2019   Lab Results  Component Value Date   ALT 26 08/07/2019   AST 16 08/07/2019   ALKPHOS 53 08/07/2019   BILITOT 0.6 08/07/2019   Lab Results  Component Value Date   HGBA1C 5.7 (H) 10/07/2018   Lab Results  Component Value Date   INSULIN 19.5 10/07/2018   Lab Results  Component Value Date   TSH 1.215 08/08/2019   Lab Results  Component Value Date   CHOL 183 10/07/2018   HDL 38 (L) 10/07/2018   LDLCALC 131 (H) 10/07/2018   TRIG 70 10/07/2018   Lab Results  Component Value Date   WBC 10.1 08/10/2019   HGB 12.0 08/10/2019   HCT 39.7 08/10/2019   MCV 99.5 08/10/2019   PLT 316 08/10/2019   Lab Results  Component Value Date   IRON 64 07/06/2019   TIBC 300 07/06/2019   FERRITIN 62 07/06/2019   Attestation Statements:   Reviewed by clinician on day of visit: allergies, medications, problem list, medical history, surgical history, family history, social history, and previous encounter notes.   I, Trixie Dredge, am acting as transcriptionist for Dennard Nip, MD.  I have reviewed the above documentation for accuracy and completeness, and I agree with the above. -  Dennard Nip, MD

## 2019-11-08 ENCOUNTER — Other Ambulatory Visit: Payer: Self-pay

## 2019-11-08 ENCOUNTER — Telehealth (HOSPITAL_COMMUNITY): Payer: Self-pay | Admitting: Licensed Clinical Social Worker

## 2019-11-08 ENCOUNTER — Ambulatory Visit (HOSPITAL_COMMUNITY): Payer: Medicare Other | Admitting: Licensed Clinical Social Worker

## 2019-11-08 NOTE — Telephone Encounter (Signed)
I called to connect with patient for phone session. She did not answer and her VM was full.

## 2019-11-10 ENCOUNTER — Ambulatory Visit (INDEPENDENT_AMBULATORY_CARE_PROVIDER_SITE_OTHER): Payer: BC Managed Care – PPO | Admitting: Family Medicine

## 2019-11-29 ENCOUNTER — Encounter (INDEPENDENT_AMBULATORY_CARE_PROVIDER_SITE_OTHER): Payer: Self-pay

## 2019-11-29 ENCOUNTER — Ambulatory Visit (INDEPENDENT_AMBULATORY_CARE_PROVIDER_SITE_OTHER): Payer: BC Managed Care – PPO | Admitting: Family Medicine

## 2019-12-02 ENCOUNTER — Encounter (HOSPITAL_COMMUNITY): Payer: Self-pay | Admitting: Psychiatry

## 2019-12-02 ENCOUNTER — Telehealth (INDEPENDENT_AMBULATORY_CARE_PROVIDER_SITE_OTHER): Payer: Medicare Other | Admitting: Psychiatry

## 2019-12-02 ENCOUNTER — Other Ambulatory Visit: Payer: Self-pay

## 2019-12-02 DIAGNOSIS — F411 Generalized anxiety disorder: Secondary | ICD-10-CM

## 2019-12-02 DIAGNOSIS — F319 Bipolar disorder, unspecified: Secondary | ICD-10-CM

## 2019-12-02 DIAGNOSIS — F3162 Bipolar disorder, current episode mixed, moderate: Secondary | ICD-10-CM

## 2019-12-02 MED ORDER — LAMOTRIGINE 150 MG PO TABS: 150.0000 mg | ORAL_TABLET | Freq: Two times a day (BID) | ORAL | 2 refills | Status: DC

## 2019-12-02 MED ORDER — LITHIUM CARBONATE ER 450 MG PO TBCR: 450.0000 mg | EXTENDED_RELEASE_TABLET | Freq: Every day | ORAL | 2 refills | Status: DC

## 2019-12-02 MED ORDER — CLONAZEPAM 0.5 MG PO TABS: 0.5000 mg | ORAL_TABLET | Freq: Two times a day (BID) | ORAL | 1 refills | Status: DC | PRN

## 2019-12-02 MED ORDER — ARIPIPRAZOLE 15 MG PO TABS: 15.0000 mg | ORAL_TABLET | Freq: Every day | ORAL | 2 refills | Status: DC

## 2019-12-02 NOTE — Progress Notes (Signed)
Virtual Visit via Telephone Note  I connected with Christine [name] on 12/02/19 at  8:40 AM EDT by telephone and verified that I am speaking with the correct person using two identifiers.   I discussed the limitations, risks, security and privacy concerns of performing an evaluation and management service by telephone and the availability of in person appointments. I also discussed with the patient that there may be a patient responsible charge related to this service. The patient expressed understanding and agreed to proceed.   History of Present Illness: Patient is evaluated by phone section.  She is taking lithium along with hydrochlorothiazide and she is actually feeling better.  Sometimes she has sadness but denies any recent crying spells or any highs and lows of any mania.  She missed the appointment from therapist would like to reschedule again.  She is using CPAP it is helping her sleep.  She is taking Klonopin daily and sometimes a second if needed.  She lives with her 2 children and husband who is supportive.  She is not working.  She is taking Abilify, Klonopin, Lamictal and lithium.  She has no rash, itching tremors or shakes.  Her weight is stable.  She had appointment for weight loss management but no new medicine added.    Past Psychiatric History:Reviewed. H/Odepression since age 3. Antidepressant mademanic. SawDr. [name] and tried Risperdal, Seroquel, Abilify, lithium, Prozac, Paxil, Zoloft, Lexapro, Celexa, Effexor and Klonopin.H/Omania and impulsive behavior. We tried [name]. H/O multiple inpatient, PHP andIOP. No h/osuicidal attempt. Last inpatientin November 2017.   Psychiatric Specialty Exam: Physical Exam  Review of Systems  There were no vitals taken for this visit.There is no height or weight on file to calculate BMI.  General Appearance: NA  Eye Contact:  NA  Speech:  Slow  Volume:  Normal  Mood:  Dysphoric  Affect:  NA  Thought Process:   Descriptions of Associations: Intact  Orientation:  Full (Time, Place, and Person)  Thought Content:  Rumination  Suicidal Thoughts:  No  Homicidal Thoughts:  No  Memory:  Immediate;   Good Recent;   Good Remote;   Good  Judgement:  Intact  Insight:  Present  Psychomotor Activity:  NA  Concentration:  Concentration: Fair and Attention Span: Fair  Recall:  Good  Fund of Knowledge:  Good  Language:  Good  Akathisia:  No  Handed:  Right  AIMS (if indicated):     Assets:  Communication Skills Desire for Improvement Housing Resilience Social Support  ADL's:  Intact  Cognition:  WNL  Sleep:   better with CPAP      Assessment and Plan: Bipolar disorder type I.  Generalized anxiety disorder.  Patient is a stable and actually doing better on her current medication.  We discussed polypharmacy however patient is resistant to cut down medication since she feels it is keeping her stable.  She has no rash or any itching.  Her mania and depression and anxiety is stable.  I will continue lithium 450 mg daily, Abilify 15 mg daily, Lamictal 300 mg daily and Klonopin 0.5 mg twice a day as needed.  She like to have another appointment with therapist since she missed the last appointment.  I discussed medication side effects and benefits.  Her sleep is better with CPAP.  Recommended to call us back if she is any question of any concern.  Discussed medication side effects and benefits.  Follow-up in 3 months.  Follow Up Instructions:    I  discussed the assessment and treatment plan with the patient. The patient was provided an opportunity to ask questions and all were answered. The patient agreed with the plan and demonstrated an understanding of the instructions.   The patient was advised to call back or seek an in-person evaluation if the symptoms worsen or if the condition fails to improve as anticipated.  I provided 20 minutes of non-face-to-face time during this encounter.   Christine Nations, MD

## 2019-12-15 DIAGNOSIS — I5189 Other ill-defined heart diseases: Secondary | ICD-10-CM | POA: Diagnosis not present

## 2019-12-15 DIAGNOSIS — Z136 Encounter for screening for cardiovascular disorders: Secondary | ICD-10-CM | POA: Diagnosis not present

## 2019-12-15 DIAGNOSIS — G4733 Obstructive sleep apnea (adult) (pediatric): Secondary | ICD-10-CM | POA: Diagnosis not present

## 2019-12-15 DIAGNOSIS — R079 Chest pain, unspecified: Secondary | ICD-10-CM | POA: Diagnosis not present

## 2019-12-15 DIAGNOSIS — D509 Iron deficiency anemia, unspecified: Secondary | ICD-10-CM | POA: Diagnosis not present

## 2019-12-15 DIAGNOSIS — R1032 Left lower quadrant pain: Secondary | ICD-10-CM | POA: Diagnosis not present

## 2019-12-15 DIAGNOSIS — F419 Anxiety disorder, unspecified: Secondary | ICD-10-CM | POA: Diagnosis not present

## 2019-12-15 DIAGNOSIS — I10 Essential (primary) hypertension: Secondary | ICD-10-CM | POA: Diagnosis not present

## 2019-12-20 ENCOUNTER — Telehealth: Payer: Self-pay

## 2019-12-20 NOTE — Telephone Encounter (Signed)
NOTES ON FILE FROM [facility] [phone], SENT REFERRAL TO SCHEDULING

## 2019-12-28 DIAGNOSIS — F3132 Bipolar disorder, current episode depressed, moderate: Secondary | ICD-10-CM | POA: Diagnosis not present

## 2020-01-05 ENCOUNTER — Encounter: Payer: Self-pay | Admitting: Adult Health

## 2020-01-07 DIAGNOSIS — F3132 Bipolar disorder, current episode depressed, moderate: Secondary | ICD-10-CM | POA: Diagnosis not present

## 2020-01-10 ENCOUNTER — Encounter: Payer: Self-pay | Admitting: Adult Health

## 2020-01-10 ENCOUNTER — Ambulatory Visit (INDEPENDENT_AMBULATORY_CARE_PROVIDER_SITE_OTHER): Payer: Medicare Other | Admitting: Adult Health

## 2020-01-10 ENCOUNTER — Other Ambulatory Visit: Payer: Self-pay

## 2020-01-10 VITALS — BP 143/80 | HR 82 | Ht 64.0 in | Wt 336.0 lb

## 2020-01-10 DIAGNOSIS — G4733 Obstructive sleep apnea (adult) (pediatric): Secondary | ICD-10-CM | POA: Diagnosis not present

## 2020-01-10 NOTE — Patient Instructions (Addendum)
Continue using BiPAP nightly and greater than 4 hours each night If your symptoms worsen or you develop new symptoms please let us know.   Please call [facility] at [phone], and press option 1 when prompted. Their customer service representatives will be glad to assist you. If they are unable to answer, please leave a message and they will call you back. Make sure to leave your name and return phone number.

## 2020-01-10 NOTE — Progress Notes (Signed)
PATIENT: Christine [name] DOB: Nov 18, 1975  REASON FOR VISIT: follow up HISTORY FROM: patient  HISTORY OF PRESENT ILLNESS: Today 01/10/20:  Christine [name] is a 44 year old female with a history of obstructive sleep apnea on BiPAP.  Her download indicates that she use her machine nightly for compliance of 100%.  She used her machine greater than 4 hours 28 days for compliance of 93%.  On average she uses her machine 5 hours and 18 minutes.  Her residual AHI is 0.3 on 15/6 centimeters of water with pressure support of 4.  She reports that she is not as sleepy during the day.  Reports that she is tolerating the BiPAP well.  She returns today for an evaluation.  HISTORY 09/06/19 Christine [name] is a 44 y.o. female , seen here as in a revisit after originally being referred from Dr [email or web address] [email or web address]   Chief complaint according to patient :  Christine [name] returns today for a revisit on 06 September 2019.  We have no contact since she finished her home sleep test on March 08, 2019.  She had been referred with a diagnosis of acute blood loss anemia, polycystic ovarian syndrome, essential hypertension, superobesity, chronic migraine, psychophysiological insomnia and inadequate sleep habits.  She was undergoing a home sleep test on [facility]. She was diagnosed with severe sleep apnea and an AHI of 61.9/h minimum of third of all apneas were central in origin the rest dominantly obstructive.  The heart rate was notably of high variability between tacky and bradycardia slow and fast heart rhythm.   In the meantime she was hospitalized in 08-09-2019  with congestive heart failure.  Her hospital physicians told her that she had CHF r;lated to sleep apnea.and that she needs to be treated urgently.   The patient was recommended to return urgently for a in lab titration but due to family obligations and the Covid restrictions she was not able to return in time.  She continues to ask variance nonrestorative sleep, the Epworth  sleepiness score was today again endorsed at 13 points fatigue severity at 44 points, both elevated and I would like for her to undergo this time a split-night sleep study we will use 2 hours of an attended polysomnography study to document again her high AHI and then convert to a CPAP titration the same night.  This kind of study protocol will require her to have Covid testing 3 days in advance.  She has not had covid 19, not to her knowledge , and has not been vaccinated.  REVIEW OF SYSTEMS: Out of a complete 14 system review of symptoms, the patient complains only of the following symptoms, and all other reviewed systems are negative.  ESS 7  ALLERGIES: Allergies  Allergen Reactions  . Penicillins Rash and Other (See Comments)    Has patient had a PCN reaction causing immediate rash, facial/tongue/throat swelling, SOB or lightheadedness with hypotension: Yes Has patient had a PCN reaction causing severe rash involving mucus membranes or skin necrosis: Unknown Has patient had a PCN reaction that required hospitalization: No Has patient had a PCN reaction occurring within the last 10 years: No If all of the above answers are "NO", then may proceed with Cephalosporin use.     HOME MEDICATIONS: Outpatient Medications Prior to Visit  Medication Sig Dispense Refill  . acetaminophen (TYLENOL) 325 MG tablet Take 650 mg by mouth every 6 (six) hours as needed for mild pain or headache.    [name] amLODipine (NORVASC) 5 MG tablet Take  1 tablet (5 mg total) by mouth daily. 30 tablet 0  . ARIPiprazole (ABILIFY) 15 MG tablet Take 1 tablet (15 mg total) by mouth daily. 30 tablet 2  . clonazePAM (KLONOPIN) 0.5 MG tablet Take 1 tablet (0.5 mg total) by mouth 2 (two) times daily as needed for anxiety. 60 tablet 1  . fluticasone (FLONASE) 50 MCG/ACT nasal spray Place into both nostrils daily as needed for allergies or rhinitis.    . hydrochlorothiazide (MICROZIDE) 12.5 MG capsule Take 1 capsule (12.5 mg  total) by mouth daily. 30 capsule 0  . lamoTRIgine (LAMICTAL) 150 MG tablet Take 1 tablet (150 mg total) by mouth 2 (two) times daily. For mood 60 tablet 2  . lithium carbonate (ESKALITH) 450 MG CR tablet Take 1 tablet (450 mg total) by mouth at bedtime. 30 tablet 2  . metFORMIN (GLUCOPHAGE) 500 MG tablet Take 1 tablet (500 mg total) by mouth 2 (two) times daily with a meal. 60 tablet 0  . Vitamin D, Ergocalciferol, (DRISDOL) 1.25 MG (50000 UNIT) CAPS capsule Take 1 capsule (50,000 Units total) by mouth every 7 (seven) days. 4 capsule 0  . metoprolol succinate (TOPROL-XL) 100 MG 24 hr tablet Take 1 tablet (100 mg total) by mouth daily. Take with or immediately following a meal. 90 tablet 3   No facility-administered medications prior to visit.    PAST MEDICAL HISTORY: Past Medical History:  Diagnosis Date  . Anemia    Presumably from menorrhagia/menomenorrhagia; has had both blood and iron transfusions  . Anxiety   . Back pain   . Bipolar 1 disorder ([place])    With depression  . Depression   . Essential hypertension   . Fibromyalgia   . Headache   . History of blood transfusion 08/2017   [facility]  . History of hiatal hernia   . Infertility, female   . Lactose intolerance   . Lower extremity edema   . Migraines   . Morbid obesity ([place])   . Osteoarthritis    Hands, ankles; HLA-B27 positive  . PCOS (polycystic ovarian syndrome)   . Seasonal allergies    With recurrent allergic rhinitis  . Uterine leiomyoma   . Vitamin D deficiency     PAST SURGICAL HISTORY: Past Surgical History:  Procedure Laterality Date  . CARDIAC CT ANGIOGRAM  03/2019    Ca Score 0. Normal Coronary origin R dominant. No evidence of CAD. ? Liver nodules - poorly visualized (consider MRI w & w/o Gad contrast)  . CERVICAL POLYPECTOMY  01/15/2018   Procedure: CERVICAL POLYPECTOMY;  Surgeon: Arvella Nigh, MD;  Location: [facility];  Service: Gynecology;;  . COLONOSCOPY  2008   Normal  . DIAGNOSTIC LAPAROSCOPY  08/1999    dermoid cyst, [facility]  . DILATION AND CURETTAGE OF UTERUS  05/2004   [facility]  . DILATION AND CURETTAGE OF UTERUS  04/2015  . HYSTEROSCOPY WITH D & C  07/22/2011   Procedure: DILATATION AND CURETTAGE /HYSTEROSCOPY;  Surgeon: Cyril Mourning, MD;  Location: [facility];  Service: Gynecology;;  . HYSTEROSCOPY WITH D & C N/A 01/15/2018   Procedure: DILATATION AND CURETTAGE [place] WITH MYOSURE;  Surgeon: Arvella Nigh, MD;  Location: [facility];  Service: Gynecology;  Laterality: N/A;  . 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 TRANSTHORACIC ECHOCARDIOGRAM  08/2017    EF 65 to 70% with vigorous wall motion.  Suggestion of high cardiac output (possibly related to anemia thyrotoxicosis, Pregnancy, sepsis etc.) -- was in setting of symptomatic  anemia - Hgb 5.7    FAMILY HISTORY: Family History  Problem Relation Age of Onset  . Anxiety disorder Mother   . Depression Mother   . Cancer Mother        pancreatic   . High blood pressure Mother   . Hypertension Mother   . Hyperlipidemia Mother   . Cancer Father        colon  . High blood pressure Father   . Diabetes Father   . Hypertension Father   . Hyperlipidemia Father   . Heart disease Father   . Stroke Father   . Kidney disease Father   . Sleep apnea Father   . Obesity Father   . Cancer Brother 6       leukemia, childhood  . High blood pressure Brother   . Migraines Neg Hx     SOCIAL HISTORY: Social History   Socioeconomic History  . Marital status: Married    Spouse name: Danny Zimny  . Number of children: 2  . Years of education: 2 years of grad school + bach & HS  . Highest education level: Bachelor's degree (e.g., BA, AB, BS)  Occupational History  . Occupation: disabled  Tobacco Use  . Smoking status: Never Smoker  . Smokeless tobacco: Never Used  Substance and Sexual Activity  . Alcohol use: Yes    Comment: occasional, maybe once a month or so  . Drug use: No  . Sexual activity: Yes    Partners: Male    Birth  control/protection: None  Other Topics Concern  . Not on file  Social History Narrative   Lives at home with her husband and 2 adopted daughters.   Right handed   Caffeine: decreased intake, drinks rarely    Is on long-term disability.   Social Determinants of Health   Financial Resource Strain:   . Difficulty of Paying Living Expenses:   Food Insecurity:   . Worried About Charity fundraiser in the Last Year:   . Arboriculturist in the Last Year:   Transportation Needs:   . Film/video editor (Medical):   [name] Lack of Transportation (Non-Medical):   Physical Activity:   . Days of Exercise per Week:   . Minutes of Exercise per Session:   Stress:   . Feeling of Stress :   Social Connections:   . Frequency of Communication with Friends and Family:   . Frequency of Social Gatherings with Friends and Family:   . Attends Religious Services:   . Active Member of Clubs or Organizations:   . Attends Archivist Meetings:   [name] Marital Status:   Intimate Partner Violence:   . Fear of Current or Ex-Partner:   . Emotionally Abused:   [name] Physically Abused:   . Sexually Abused:       PHYSICAL EXAM  Vitals:   01/10/20 0828  BP: (!) 143/80  Pulse: 82  Weight: (!) 336 lb (152.4 kg)  Height: '5\' 4"'$  (1.626 m)   Body mass index is 57.67 kg/m.  Generalized: Well developed, in no acute distress  Chest: Lungs clear to auscultation bilaterally  Neurological examination  Mentation: Alert oriented to time, place, history taking. Follows all commands speech and language fluent Cranial nerve II-XII: Extraocular movements were full, visual field were full on confrontational test Head turning and shoulder shrug  were normal and symmetric. Motor: The motor testing reveals 5 over 5 strength of all 4 extremities. Good symmetric motor  tone is noted throughout.  Sensory: Sensory testing is intact to soft touch on all 4 extremities. No evidence of extinction is noted.  Gait and  station: Gait is normal.    DIAGNOSTIC DATA (LABS, IMAGING, TESTING) - I reviewed patient records, labs, notes, testing and imaging myself where available.  Lab Results  Component Value Date   WBC 10.1 08/10/2019   HGB 12.0 08/10/2019   HCT 39.7 08/10/2019   MCV 99.5 08/10/2019   PLT 316 08/10/2019      Component Value Date/Time   NA 138 08/10/2019 0514   NA 138 10/07/2018 1219   K 4.0 08/10/2019 0514   CL 100 08/10/2019 0514   CO2 28 08/10/2019 0514   GLUCOSE 102 (H) 08/10/2019 0514   BUN 13 08/10/2019 0514   BUN 10 10/07/2018 1219   CREATININE 0.92 08/10/2019 0514   CREATININE 0.87 02/16/2019 0835   CALCIUM 9.5 08/10/2019 0514   PROT 7.5 08/07/2019 1424   PROT 7.0 10/07/2018 1219   ALBUMIN 4.2 08/07/2019 1424   ALBUMIN 4.2 10/07/2018 1219   AST 16 08/07/2019 1424   AST 9 (L) 02/16/2019 0835   ALT 26 08/07/2019 1424   ALT 14 02/16/2019 0835   ALKPHOS 53 08/07/2019 1424   BILITOT 0.6 08/07/2019 1424   BILITOT 0.4 02/16/2019 0835   GFRNONAA >60 08/10/2019 0514   GFRNONAA >60 02/16/2019 0835   GFRAA >60 08/10/2019 0514   GFRAA >60 02/16/2019 0835   Lab Results  Component Value Date   CHOL 183 10/07/2018   HDL 38 (L) 10/07/2018   LDLCALC 131 (H) 10/07/2018   TRIG 70 10/07/2018   Lab Results  Component Value Date   HGBA1C 5.7 (H) 10/07/2018   Lab Results  Component Value Date   VITAMINB12 420 10/07/2018   Lab Results  Component Value Date   TSH 1.215 08/08/2019      ASSESSMENT AND PLAN 44 y.o. year old female  has a past medical history of Anemia, Anxiety, Back pain, Bipolar 1 disorder ([place]), Depression, Essential hypertension, Fibromyalgia, Headache, History of blood transfusion (08/2017), History of hiatal hernia, Infertility, female, Lactose intolerance, Lower extremity edema, Migraines, Morbid obesity ([place]), Osteoarthritis, PCOS (polycystic ovarian syndrome), Seasonal allergies, Uterine leiomyoma, and Vitamin D deficiency. here with:  1. OSA on  BiPAP  - BiPAP compliance excellent - Good treatment of AHI  - Encourage patient to use CPAP nightly and > 4 hours each night - F/U in 1 year or sooner if needed   I spent 20 minutes of face-to-face and non-face-to-face time with patient.  This included previsit chart review, lab review, study review, order entry, electronic health record documentation, patient education.  Ward Givens, MSN, NP-C 01/10/2020, 8:42 AM [facility] [address] [place], [place] [address] [phone]

## 2020-01-14 DIAGNOSIS — F3132 Bipolar disorder, current episode depressed, moderate: Secondary | ICD-10-CM | POA: Diagnosis not present

## 2020-01-16 NOTE — Progress Notes (Signed)
Cardiology Office Note   Date:  01/17/2020   ID:  Christine [name], DOB 03/22/1976, MRN [id number]  PCP:  Christine Leriche, PA-C  Cardiologist:  Dr. Ellyn [name] CC: Follow Up   History of Present Illness: Christine [name] is a 44 y.o. female who presents for posthospitalization follow-up with known history of hypertension, PCOS, suggested pulmonary hypertension with sleep apnea.  She had not been scheduled for CPAP titration at time of admission.  Other history includes bipolar disorder.  She was admitted to the hospital with shortness of breath and chest pain in January 2021.  This was due to dietary indiscretion with salt.  She was diuresed, discharged on lower dose of amlodipine to 5 mg daily, started on HCTZ.  She was to follow-up with PCP and psychiatry.  She did follow-up with neurology for BiPAP titration.  With improvement of symptoms on that office visit  She comes today quite nervous as she is uncomfortable in medical offices.  She is tachycardic and slightly hypertensive.  She states that she normally takes a clonazepam prior to medical office visits but forgot to today.  She denies any significant symptoms.  She continues to have some dyspnea on exertion which she believes is related to her weight.  She would like to begin an exercise program but has concerns about how this will affect her heart.  She is medically compliant.  She is followed routinely by her primary care for management of diabetes.  Past Medical History:  Diagnosis Date  . Anemia    Presumably from menorrhagia/menomenorrhagia; has had both blood and iron transfusions  . Anxiety   . Back pain   . Bipolar 1 disorder ([place])    With depression  . Depression   . Essential hypertension   . Fibromyalgia   . Headache   . History of blood transfusion 08/2017   [facility]  . History of hiatal hernia   . Infertility, female   . Lactose intolerance   . Lower extremity edema   . Migraines   . Morbid obesity ([place])   .  Osteoarthritis    Hands, ankles; HLA-B27 positive  . PCOS (polycystic ovarian syndrome)   . Seasonal allergies    With recurrent allergic rhinitis  . Uterine leiomyoma   . Vitamin D deficiency     Past Surgical History:  Procedure Laterality Date  . CARDIAC CT ANGIOGRAM  03/2019    Ca Score 0. Normal Coronary origin R dominant. No evidence of CAD. ? Liver nodules - poorly visualized (consider MRI w & w/o Gad contrast)  . CERVICAL POLYPECTOMY  01/15/2018   Procedure: CERVICAL POLYPECTOMY;  Surgeon: Arvella Nigh, MD;  Location: [facility];  Service: Gynecology;;  . COLONOSCOPY  2008   Normal  . DIAGNOSTIC LAPAROSCOPY  08/1999   dermoid cyst, RSO  . DILATION AND CURETTAGE OF UTERUS  05/2004   [facility]  . DILATION AND CURETTAGE OF UTERUS  04/2015  . HYSTEROSCOPY WITH D & C  07/22/2011   Procedure: DILATATION AND CURETTAGE /HYSTEROSCOPY;  Surgeon: Cyril Mourning, MD;  Location: [facility];  Service: Gynecology;;  . HYSTEROSCOPY WITH D & C N/A 01/15/2018   Procedure: DILATATION AND CURETTAGE [place] WITH MYOSURE;  Surgeon: Arvella Nigh, MD;  Location: [facility];  Service: Gynecology;  Laterality: N/A;  . 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 TRANSTHORACIC ECHOCARDIOGRAM  08/2017    EF 65 to 70% with vigorous wall motion.  Suggestion of high cardiac output (possibly related to anemia thyrotoxicosis,  Pregnancy, sepsis etc.) -- was in setting of symptomatic anemia - Hgb 5.7     Current Outpatient Medications  Medication Sig Dispense Refill  . acetaminophen (TYLENOL) 325 MG tablet Take 650 mg by mouth every 6 (six) hours as needed for mild pain or headache.    [name] amLODipine (NORVASC) 5 MG tablet Take 1 tablet (5 mg total) by mouth daily. 30 tablet 0  . ARIPiprazole (ABILIFY) 15 MG tablet Take 1 tablet (15 mg total) by mouth daily. 30 tablet 2  . clonazePAM (KLONOPIN) 0.5 MG tablet Take 1 tablet (0.5 mg total) by mouth 2 (two) times daily as needed for anxiety. 60 tablet 1  . fluticasone  (FLONASE) 50 MCG/ACT nasal spray Place into both nostrils daily as needed for allergies or rhinitis.    . hydrochlorothiazide (MICROZIDE) 12.5 MG capsule Take 1 capsule (12.5 mg total) by mouth daily. 30 capsule 0  . lamoTRIgine (LAMICTAL) 150 MG tablet Take 1 tablet (150 mg total) by mouth 2 (two) times daily. For mood 60 tablet 2  . lithium carbonate (ESKALITH) 450 MG CR tablet Take 1 tablet (450 mg total) by mouth at bedtime. 30 tablet 2  . metFORMIN (GLUCOPHAGE) 500 MG tablet Take 1 tablet (500 mg total) by mouth 2 (two) times daily with a meal. 60 tablet 0  . Vitamin D, Ergocalciferol, (DRISDOL) 1.25 MG (50000 UNIT) CAPS capsule Take 1 capsule (50,000 Units total) by mouth every 7 (seven) days. 4 capsule 0  . metoprolol succinate (TOPROL-XL) 100 MG 24 hr tablet Take 1 tablet (100 mg total) by mouth daily. Take with or immediately following a meal. 90 tablet 3   No current facility-administered medications for this visit.    Allergies:   Penicillins    Social History:  The patient  reports that she has never smoked. She has never used smokeless tobacco. She reports current alcohol use. She reports that she does not use drugs.   Family History:  The patient's family history includes Anxiety disorder in her mother; Cancer in her father and mother; Cancer (age of onset: 10) in her brother; Depression in her mother; Diabetes in her father; Heart disease in her father; High blood pressure in her brother, father, and mother; Hyperlipidemia in her father and mother; Hypertension in her father and mother; Kidney disease in her father; Obesity in her father; Sleep apnea in her father; Stroke in her father.    ROS: All other systems are reviewed and negative. Unless otherwise mentioned in H&P    PHYSICAL EXAM: VS:  BP (!) 160/98   Pulse (!) 108   Ht '5\' 4"'$  (1.626 m)   Wt (!) 344 lb 3.2 oz (156.1 kg)   LMP 01/03/2020   SpO2 99%   BMI 59.08 kg/m  , BMI Body mass index is 59.08 kg/m. GEN: Well  nourished, well developed, in no acute distress, morbidly obese HEENT: normal Neck: no JVD, carotid bruits, or masses Cardiac: RRR; tachycardic, no murmurs, rubs, or gallops,no edema  Respiratory:  Clear to auscultation bilaterally, normal work of breathing GI: soft, nontender, nondistended, + BS MS: no deformity or atrophy Skin: warm and dry, no rash Neuro:  Strength and sensation are intact Psych: euthymic mood, full affect   EKG: Sinus tachycardia heart rate of 108 bpm Recent Labs: 08/07/2019: ALT 26; B Natriuretic Peptide 16.1 08/08/2019: TSH 1.215 08/10/2019: BUN 13; Creatinine, Ser 0.92; Hemoglobin 12.0; Platelets 316; Potassium 4.0; Sodium 138    Lipid Panel    Component Value Date/Time  CHOL 183 10/07/2018 1220   TRIG 70 10/07/2018 1220   HDL 38 (L) 10/07/2018 1220   LDLCALC 131 (H) 10/07/2018 1220      Wt Readings from Last 3 Encounters:  01/17/20 (!) 344 lb 3.2 oz (156.1 kg)  01/10/20 (!) 336 lb (152.4 kg)  10/20/19 (!) 334 lb (151.5 kg)      Other studies Reviewed: Echocardiogram 08-24-2019 1. Left ventricular ejection fraction, by visual estimation, is 60 to  65%. The left ventricle has normal function. There is no left ventricular  hypertrophy.  2. The left ventricle has no regional wall motion abnormalities.  3. Global right ventricle has normal systolic function.The right  ventricular size is normal. No increase in right ventricular wall  thickness.  4. Left atrial size was normal.  5. Right atrial size was normal.  6. The mitral valve is normal in structure. No evidence of mitral valve  regurgitation.  7. The tricuspid valve is normal in structure.  8. The aortic valve is normal in structure. Aortic valve regurgitation is  not visualized.  9. The pulmonic valve was normal in structure. Pulmonic valve  regurgitation is not visualized.  10. The atrial septum is grossly normal.    ASSESSMENT AND PLAN:  1.  Hypertension: Elevated today on  initial vital signs.  I have rechecked her vital signs using a large cuff with blood pressure 138/68 heart rate is down to 96 bpm.  Will not make any changes in her medication regimen at this time.  If she remains tachycardic may need to consider adjusting beta-blocker.  2.  Dyspne with history of OSA: Much better at rest but continues to have some dyspnea on exertion due to her obesity.  She is compliant with BiPAP at home.  No changes at this time in her medication regimen.  3.  Morbid obesity: She asked about an exercise regimen.  She does not have a stationary bike.  I have advised her to write this for 5 to 10 minutes a day at a slow pace in order to increase her stamina and slowly increase her time and speed as she tolerates.  Also walking every day has been advised in the cool part of the day and not in the heat,  to allow her to tolerate this as well.  Walking 5 to 10 minutes a day would be helpful.  She and her family are going to [employer] and July and have advised her to slowly increase her activity so that she will be able to tolerate the long amount of walking that will occur during that vacation.  Current medicines are reviewed at length with the patient today.  I have spent 35 minutes dedicated to the care of this patient on the date of this encounter to include pre-visit review of records, assessment, management and diagnostic testing,with shared decision making.  Labs/ tests ordered today include: None Phill Myron. West Pugh, ANP, AACC   01/17/2020 4:28 PM    [facility] [place] [address] Office [phone] Fax [phone]  Notice: This dictation was prepared with Dragon dictation along with smaller phrase technology. Any transcriptional errors that result from this process are unintentional and may not be corrected upon review.

## 2020-01-17 ENCOUNTER — Ambulatory Visit (INDEPENDENT_AMBULATORY_CARE_PROVIDER_SITE_OTHER): Payer: Medicare Other | Admitting: Adult Health

## 2020-01-17 ENCOUNTER — Other Ambulatory Visit: Payer: Self-pay

## 2020-01-17 ENCOUNTER — Encounter: Payer: Self-pay | Admitting: Adult Health

## 2020-01-17 VITALS — BP 160/98 | HR 108 | Ht 64.0 in | Wt 344.2 lb

## 2020-01-17 DIAGNOSIS — G4733 Obstructive sleep apnea (adult) (pediatric): Secondary | ICD-10-CM | POA: Diagnosis not present

## 2020-01-17 DIAGNOSIS — I10 Essential (primary) hypertension: Secondary | ICD-10-CM

## 2020-01-17 DIAGNOSIS — I5033 Acute on chronic diastolic (congestive) heart failure: Secondary | ICD-10-CM

## 2020-01-17 NOTE — Patient Instructions (Signed)
Medication Instructions:  Your physician recommends that you continue on your current medications as directed. Please refer to the Current Medication list given to you today.  *If you need a refill on your cardiac medications before your next appointment, please call your pharmacy*   Follow-Up: At [facility], you and your health needs are our priority.  As part of our continuing mission to provide you with exceptional heart care, we have created designated Provider Care Teams.  These Care Teams include your primary Cardiologist (physician) and Advanced Practice Providers (APPs -  Physician Assistants and Nurse Practitioners) who all work together to provide you with the care you need, when you need it.  We recommend signing up for the patient portal called "MyChart".  Sign up information is provided on this After Visit Summary.  MyChart is used to connect with patients for Virtual Visits (Telemedicine).  Patients are able to view lab/test results, encounter notes, upcoming appointments, etc.  Non-urgent messages can be sent to your provider as well.   To learn more about what you can do with MyChart, go to [email or web address].    Your next appointment:   6 month(s)  The format for your next appointment:   In Person  Provider:   You may see Glenetta Hew, MD or one of the following Advanced Practice Providers on your designated Care Team:    Rosaria Ferries, PA-C  Jory Sims, DNP, ANP  Cadence Kathlen Mody, NP    Other Instructions Please call our office 2 months in advance to schedule your follow-up appointment with Dr. Ellyn Hack.

## 2020-01-21 DIAGNOSIS — F3132 Bipolar disorder, current episode depressed, moderate: Secondary | ICD-10-CM | POA: Diagnosis not present

## 2020-02-04 DIAGNOSIS — F3132 Bipolar disorder, current episode depressed, moderate: Secondary | ICD-10-CM | POA: Diagnosis not present

## 2020-02-25 DIAGNOSIS — F3132 Bipolar disorder, current episode depressed, moderate: Secondary | ICD-10-CM | POA: Diagnosis not present

## 2020-03-02 ENCOUNTER — Encounter (HOSPITAL_COMMUNITY): Payer: Self-pay | Admitting: Psychiatry

## 2020-03-02 ENCOUNTER — Other Ambulatory Visit: Payer: Self-pay

## 2020-03-02 ENCOUNTER — Telehealth (INDEPENDENT_AMBULATORY_CARE_PROVIDER_SITE_OTHER): Payer: Medicare Other | Admitting: Psychiatry

## 2020-03-02 VITALS — Wt 337.0 lb

## 2020-03-02 DIAGNOSIS — F3162 Bipolar disorder, current episode mixed, moderate: Secondary | ICD-10-CM | POA: Diagnosis not present

## 2020-03-02 DIAGNOSIS — F419 Anxiety disorder, unspecified: Secondary | ICD-10-CM

## 2020-03-02 MED ORDER — ARIPIPRAZOLE 15 MG PO TABS: 15.0000 mg | ORAL_TABLET | Freq: Every day | ORAL | 2 refills | Status: DC

## 2020-03-02 MED ORDER — LITHIUM CARBONATE ER 450 MG PO TBCR: 450.0000 mg | EXTENDED_RELEASE_TABLET | Freq: Every day | ORAL | 2 refills | Status: DC

## 2020-03-02 MED ORDER — LAMOTRIGINE 150 MG PO TABS: 150.0000 mg | ORAL_TABLET | Freq: Two times a day (BID) | ORAL | 2 refills | Status: DC

## 2020-03-02 MED ORDER — CLONAZEPAM 0.5 MG PO TABS: 0.5000 mg | ORAL_TABLET | Freq: Every day | ORAL | 0 refills | Status: DC | PRN

## 2020-03-02 NOTE — Progress Notes (Signed)
Virtual Visit via Telephone Note  I connected with Christine [name] on 03/02/20 at  8:40 AM EDT by telephone and verified that I am speaking with the correct person using two identifiers.  Location: Patient: home Provider: home office   I discussed the limitations, risks, security and privacy concerns of performing an evaluation and management service by telephone and the availability of in person appointments. I also discussed with the patient that there may be a patient responsible charge related to this service. The patient expressed understanding and agreed to proceed.   History of Present Illness: Patient is evaluated by phone session.  She is doing better on her medication.  Since starting CPAP her sleep is improved and she feels more relaxed next day.  She has more energy.  Recently she went with her family to [employer] and she had a good time.  She denies any mania, psychosis, anger, hallucination or any paranoia.  She feels her symptoms are much controlled with the current medication and she is reluctant to change anything.  She takes Klonopin 3-4 times a week that helps the anxiety.  She lives with her 2 children and a husband.  She is not working.  She has no tremors, shakes or any EPS.  She started therapy with [name] in the past going well.  Her weight is stable.  Patient like to keep her current medication.    Past Psychiatric History:Reviewed. H/Odepression since age 20. Antidepressant mademanic. SawDr. [name] and tried Risperdal, Seroquel, Abilify, lithium, Prozac, Paxil, Zoloft, Lexapro, Celexa, Effexor and Klonopin.H/Omania and impulsive behavior. We tried [name].H/O multiple inpatient,PHP andIOP. No h/osuicidal attempt. Last inpatientin November 2017.   Psychiatric Specialty Exam: Physical Exam  Review of Systems  Weight (!) 337 lb (152.9 kg).There is no height or weight on file to calculate BMI.  General Appearance: NA  Eye Contact:   NA  Speech:  Normal Rate  Volume:  Normal  Mood:  Euthymic  Affect:  NA  Thought Process:  Goal Directed  Orientation:  Full (Time, Place, and Person)  Thought Content:  WDL  Suicidal Thoughts:  No  Homicidal Thoughts:  No  Memory:  Immediate;   Good Recent;   Good Remote;   Fair  Judgement:  Intact  Insight:  Present  Psychomotor Activity:  NA  Concentration:  Concentration: Fair and Attention Span: Fair  Recall:  Good  Fund of Knowledge:  Good  Language:  Good  Akathisia:  No  Handed:  Right  AIMS (if indicated):     Assets:  Communication Skills Desire for Improvement Housing Resilience Social Support  ADL's:  Intact  Cognition:  WNL  Sleep:   ok. Using CPAP      Assessment and Plan: Bipolar disorder type I.  Anxiety.  Patient doing well on her current medication.  She is reluctant to cut down to change her medication.  We will continue lithium 450 mg daily, Lamictal 300 mg daily, Abilify 50 mg daily and Klonopin 0.5 mg as needed.  Encouraged to continue therapy with [name].  Discussed medication side effects and benefits.  We will try to cut down her medicine and next appointment.  Recommended to call us back if she has any question or any concern.  Follow-up in 3 months.  Follow Up Instructions:    I discussed the assessment and treatment plan with the patient. The patient was provided an opportunity to ask questions and all were answered. The patient agreed with the plan and  demonstrated an understanding of the instructions.   The patient was advised to call back or seek an in-person evaluation if the symptoms worsen or if the condition fails to improve as anticipated.  I provided 12 minutes of non-face-to-face time during this encounter.   Kathlee Nations, MD

## 2020-04-21 DIAGNOSIS — Z20822 Contact with and (suspected) exposure to covid-19: Secondary | ICD-10-CM | POA: Diagnosis not present

## 2020-04-21 DIAGNOSIS — Z03818 Encounter for observation for suspected exposure to other biological agents ruled out: Secondary | ICD-10-CM | POA: Diagnosis not present

## 2020-04-24 DIAGNOSIS — F3132 Bipolar disorder, current episode depressed, moderate: Secondary | ICD-10-CM | POA: Diagnosis not present

## 2020-05-03 DIAGNOSIS — D259 Leiomyoma of uterus, unspecified: Secondary | ICD-10-CM | POA: Diagnosis not present

## 2020-05-03 DIAGNOSIS — D649 Anemia, unspecified: Secondary | ICD-10-CM | POA: Diagnosis not present

## 2020-05-03 DIAGNOSIS — N920 Excessive and frequent menstruation with regular cycle: Secondary | ICD-10-CM | POA: Diagnosis not present

## 2020-05-09 DIAGNOSIS — F3132 Bipolar disorder, current episode depressed, moderate: Secondary | ICD-10-CM | POA: Diagnosis not present

## 2020-05-10 DIAGNOSIS — N924 Excessive bleeding in the premenopausal period: Secondary | ICD-10-CM | POA: Diagnosis not present

## 2020-05-10 DIAGNOSIS — D251 Intramural leiomyoma of uterus: Secondary | ICD-10-CM | POA: Diagnosis not present

## 2020-05-11 DIAGNOSIS — I10 Essential (primary) hypertension: Secondary | ICD-10-CM | POA: Diagnosis not present

## 2020-05-11 DIAGNOSIS — R600 Localized edema: Secondary | ICD-10-CM | POA: Diagnosis not present

## 2020-05-11 DIAGNOSIS — I5032 Chronic diastolic (congestive) heart failure: Secondary | ICD-10-CM | POA: Diagnosis not present

## 2020-05-18 DIAGNOSIS — F3132 Bipolar disorder, current episode depressed, moderate: Secondary | ICD-10-CM | POA: Diagnosis not present

## 2020-05-29 DIAGNOSIS — D649 Anemia, unspecified: Secondary | ICD-10-CM | POA: Diagnosis not present

## 2020-05-31 ENCOUNTER — Telehealth (HOSPITAL_COMMUNITY): Payer: Medicare Other | Admitting: Psychiatry

## 2020-06-01 ENCOUNTER — Encounter (HOSPITAL_COMMUNITY): Payer: Self-pay | Admitting: Psychiatry

## 2020-06-01 ENCOUNTER — Other Ambulatory Visit: Payer: Self-pay

## 2020-06-01 ENCOUNTER — Telehealth (INDEPENDENT_AMBULATORY_CARE_PROVIDER_SITE_OTHER): Payer: Medicare Other | Admitting: Psychiatry

## 2020-06-01 DIAGNOSIS — F3162 Bipolar disorder, current episode mixed, moderate: Secondary | ICD-10-CM | POA: Diagnosis not present

## 2020-06-01 DIAGNOSIS — F419 Anxiety disorder, unspecified: Secondary | ICD-10-CM

## 2020-06-01 MED ORDER — CLONAZEPAM 0.5 MG PO TABS: 0.5000 mg | ORAL_TABLET | Freq: Every day | ORAL | 0 refills | Status: DC | PRN

## 2020-06-01 MED ORDER — LITHIUM CARBONATE ER 450 MG PO TBCR: 450.0000 mg | EXTENDED_RELEASE_TABLET | Freq: Every day | ORAL | 2 refills | Status: DC

## 2020-06-01 MED ORDER — LAMOTRIGINE 150 MG PO TABS: 150.0000 mg | ORAL_TABLET | Freq: Two times a day (BID) | ORAL | 2 refills | Status: DC

## 2020-06-01 MED ORDER — ARIPIPRAZOLE 15 MG PO TABS: 15.0000 mg | ORAL_TABLET | Freq: Every day | ORAL | 2 refills | Status: DC

## 2020-06-01 NOTE — Progress Notes (Signed)
Virtual Visit via Telephone Note  I connected with Christine [name] on 06/01/20 at  9:40 AM EDT by telephone and verified that I am speaking with the correct person using two identifiers.  Location: Patient: Home Provider: Home office   I discussed the limitations, risks, security and privacy concerns of performing an evaluation and management service by telephone and the availability of in person appointments. I also discussed with the patient that there may be a patient responsible charge related to this service. The patient expressed understanding and agreed to proceed.   History of Present Illness: Patient is evaluated by phone session.  She is taking the medication as prescribed.  She is using CPAP is helping her sleep.  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  Patient told the child was very violent.  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  Patient told she had work this before and she is more comfortable doing this job.  Patient is on disability but also she is on trial work period for 1 year to see if she can go back to workforce.  She feels the current medicine is working and hoping that she can do full-time job.  She had a good support from her husband.  She feels her mania, psychosis is a stable she denies any recent mood swings, anger, anxiety or any agitation.  She denies any crying spells or any feeling of hopelessness or worthlessness.  She does take Klonopin 2-3 times a week and she has left her work at this time.  She lives with her children and her husband.  She is taking Abilify, Lamictal and lithium.  Recently she had a blood work with her PCP Dr. [name] at [place] and she was told everything is normal.  Patient has no concern from the medication.  She has no tremors, shakes, EPS.  She is in therapy with [name] and that is going well.  Past Psychiatric History: H/Odepression since age 66. Antidepressant mademanic. SawDr. [name] and tried Risperdal, Seroquel, Abilify, lithium, Prozac, Paxil, Zoloft, Lexapro, Celexa, Effexor and Klonopin.H/Omania and impulsive behavior. We tried [name].H/O multiple inpatient,PHP andIOP. No h/osuicidal attempt. Last inpatientin November 2017.   Psychiatric Specialty Exam: Physical Exam  Review of Systems  Weight (!) 335 lb (152 kg).There is no height or weight on file to calculate BMI.  General Appearance: NA  Eye Contact:  NA  Speech:  Clear and Coherent  Volume:  Normal  Mood:  Anxious  Affect:  NA  Thought Process:  Goal Directed  Orientation:  Full (Time, Place, and Person)  Thought Content:  Rumination  Suicidal Thoughts:  No  Homicidal Thoughts:  No  Memory:  Immediate;   Good Recent;   Good Remote;   Good  Judgement:  Intact  Insight:  Present  Psychomotor Activity:  NA  Concentration:  Concentration: Fair and Attention Span: Fair  Recall:  Good  Fund of Knowledge:  Good  Language:  Good  Akathisia:  No  Handed:  Right  AIMS (if indicated):     Assets:  Communication Skills Desire for Improvement Housing Resilience Social Support  ADL's:  Intact  Cognition:  WNL  Sleep:   ok with cpap      Assessment and Plan: Bipolar disorder type I.  Anxiety.  Patient is a stable on her current medication.  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  She like to go back to workforce but currently she is on disability.  I encouraged to keep the therapy appointment with Para March.  Continue Abilify 15 mg daily, Lamictal 300 mg daily, lithium 450 mg at bedtime and Klonopin 0.5 mg to take as  needed.  She had a blood work at her PCP office 2 weeks ago and I recommended to have her blood work results faxed to [place].  Discussed medication side effects and benefits.  Recommended to call us back if she has any question or any concern.  Follow-up in 3 months.  Follow Up Instructions:    I discussed the assessment and treatment plan with the patient. The patient was provided an opportunity to ask questions and all were answered. The patient agreed with the plan and demonstrated an understanding of the instructions.   The patient was advised to call back or seek an in-person evaluation if the symptoms worsen or if the condition fails to improve as anticipated.  I provided 20 minutes of non-face-to-face time during this encounter.   Kathlee Nations, MD

## 2020-07-26 DIAGNOSIS — Z20822 Contact with and (suspected) exposure to covid-19: Secondary | ICD-10-CM | POA: Diagnosis not present

## 2020-08-29 ENCOUNTER — Telehealth (HOSPITAL_COMMUNITY): Payer: Medicare Other | Admitting: Psychiatry

## 2020-09-01 ENCOUNTER — Other Ambulatory Visit: Payer: Self-pay

## 2020-09-01 ENCOUNTER — Encounter (HOSPITAL_COMMUNITY): Payer: Self-pay | Admitting: Psychiatry

## 2020-09-01 ENCOUNTER — Other Ambulatory Visit (HOSPITAL_COMMUNITY): Payer: Self-pay | Admitting: *Deleted

## 2020-09-01 ENCOUNTER — Telehealth (INDEPENDENT_AMBULATORY_CARE_PROVIDER_SITE_OTHER): Payer: Medicare Other | Admitting: Psychiatry

## 2020-09-01 ENCOUNTER — Telehealth (HOSPITAL_COMMUNITY): Payer: Self-pay | Admitting: *Deleted

## 2020-09-01 DIAGNOSIS — F3162 Bipolar disorder, current episode mixed, moderate: Secondary | ICD-10-CM | POA: Diagnosis not present

## 2020-09-01 DIAGNOSIS — F419 Anxiety disorder, unspecified: Secondary | ICD-10-CM | POA: Diagnosis not present

## 2020-09-01 DIAGNOSIS — Z79899 Other long term (current) drug therapy: Secondary | ICD-10-CM

## 2020-09-01 MED ORDER — LITHIUM CARBONATE ER 450 MG PO TBCR: 450.0000 mg | EXTENDED_RELEASE_TABLET | Freq: Two times a day (BID) | ORAL | 0 refills | Status: DC

## 2020-09-01 MED ORDER — LAMOTRIGINE 150 MG PO TABS: 150.0000 mg | ORAL_TABLET | Freq: Two times a day (BID) | ORAL | 0 refills | Status: DC

## 2020-09-01 MED ORDER — CLONAZEPAM 0.5 MG PO TABS: 0.5000 mg | ORAL_TABLET | Freq: Every day | ORAL | 0 refills | Status: DC | PRN

## 2020-09-01 MED ORDER — ARIPIPRAZOLE 15 MG PO TABS: 15.0000 mg | ORAL_TABLET | Freq: Every day | ORAL | 0 refills | Status: DC

## 2020-09-01 NOTE — Progress Notes (Signed)
Virtual Visit via Telephone Note  I connected with Christine [name] on 09/01/20 at  8:40 AM EST by telephone and verified that I am speaking with the correct person using two identifiers.  Location: Patient: Home Provider: Home Office   I discussed the limitations, risks, security and privacy concerns of performing an evaluation and management service by telephone and the availability of in person appointments. I also discussed with the patient that there may be a patient responsible charge related to this service. The patient expressed understanding and agreed to proceed.   History of Present Illness: Patient is evaluated by phone session.  She is on the phone by herself.  She reported increased anxiety, mood swings, irritability and having crying spells.  She is now working full-time and admitted getting easily frustrated.  She reported her attention, concentration and depression is getting worse.  Though she denies any suicidal thoughts or any plan but having negative thoughts.  She is also having panic attacks.  Her sleep is clear even though she is using CPAP.  She admitted not able to had many sessions with Christine [name] because she has been busy at work.  She is doing psychological evaluation for developmental challenging status.  She has done in the past but now she feels that she is struggling.  Patient is on disability but she is on a trial to go back to work.  Now she has double minded that if she can continue the job.  She feels very anxious about it.  She denies any paranoia but having ruminative thoughts.  She feels her temper and anger is getting worse.  She had a support from her husband.  Patient told her husband also noticed that she is struggling with her mood.  Her appetite is okay.  Her weight is unchanged from the past.  She admitted not having an appointment with her PCP Christine [name] at [facility] in a while.  She is taking lithium, Lamictal, Abilify and sometimes  Klonopin.  She has no tremors, shakes or any EPS.  Her last lithium level was in January 2021.  Which was normal.  Past Psychiatric History: H/Odepression since age 66. Antidepressant mademanic. SawDr. Letta [name] and tried Risperdal, Seroquel, Abilify, lithium, Prozac, Paxil, Zoloft, Lexapro, Celexa, Effexor and Klonopin.H/Omania and impulsive behavior. We tried [name].H/O multiple inpatient,PHP andIOP. No h/osuicidal attempt. Last inpatientin November 2017.   Psychiatric Specialty Exam: Physical Exam  Review of Systems  Weight (!) 340 lb (154.2 kg).There is no height or weight on file to calculate BMI.  General Appearance: NA  Eye Contact:  NA  Speech:  Normal Rate  Volume:  Normal  Mood:  Dysphoric and Irritable  Affect:  NA  Thought Process:  Descriptions of Associations: Intact  Orientation:  Full (Time, Place, and Person)  Thought Content:  Rumination  Suicidal Thoughts:  No  Homicidal Thoughts:  No  Memory:  Immediate;   Good Recent;   Good Remote;   Good  Judgement:  Fair  Insight:  Shallow  Psychomotor Activity:  NA  Concentration:  Concentration: Fair and Attention Span: Fair  Recall:  Good  Fund of Knowledge:  Good  Language:  Good  Akathisia:  No  Handed:  Right  AIMS (if indicated):     Assets:  Communication Skills Desire for Improvement Housing Resilience  ADL's:  Intact  Cognition:  WNL  Sleep:   fair with CPAP      Assessment and Plan: Bipolar disorder type I.  Anxiety.  Patient notices worsening of the symptoms with having more mood swings and irritability anger and having crying spells.  She is not sure if she can work full-time.  She is on disability but she is in a program for a trial.  I discussed to have a blood work done including lithium level, CBC, CMP, hemoglobin A1c.  We will increase lithium from 450mg  to 9 mg a day.  I recommend to have the blood work in 1 week after increasing the dose.  I encourage to restart  therapy with Christine [name].  We discussed about making a decision to continue to work full-time for either part-time after given a trial of higher dose of lithium.  She agreed with the plan.  I discussed medication side effects and benefits.  Follow-up in 4 weeks.  Refills given for 30 days only.  Follow Up Instructions:    I discussed the assessment and treatment plan with the patient. The patient was provided an opportunity to ask questions and all were answered. The patient agreed with the plan and demonstrated an understanding of the instructions.   The patient was advised to call back or seek an in-person evaluation if the symptoms worsen or if the condition fails to improve as anticipated.  I provided 28 minutes of non-face-to-face time during this encounter.   Christine Nations, MD

## 2020-09-01 NOTE — Telephone Encounter (Signed)
Writer left VM to advise pt that labs have been ordered and sent to [place] [place]. Pt advised to hold lithium prior to blood draw if taking in the morning. encouraged to call with any questions or concerns.

## 2020-09-05 ENCOUNTER — Telehealth (HOSPITAL_COMMUNITY): Payer: Self-pay | Admitting: *Deleted

## 2020-09-05 NOTE — Telephone Encounter (Signed)
Pt not currently on [name]. ? Has been in the past.

## 2020-09-05 NOTE — Telephone Encounter (Signed)
PA for [name] submitted via CoverMyMeds . Awaiting determination.

## 2020-09-05 NOTE — Telephone Encounter (Signed)
I have no idea.

## 2020-09-05 NOTE — Telephone Encounter (Signed)
Why PA for [name]? She is on Abilify.

## 2020-09-22 ENCOUNTER — Telehealth: Payer: Self-pay | Admitting: Cardiology

## 2020-09-22 NOTE — Telephone Encounter (Signed)
Pt c/o of Chest Pain: STAT if CP now or developed within 24 hours  1. Are you having CP right now? no  2. Are you experiencing any other symptoms (ex. SOB, nausea, vomiting, sweating)? 2-3 weeks  3. How long have you been experiencing CP? A few weeks  4. Is your CP continuous or coming and going? Coming and going, lasts for about 20 minutes.  5. Have you taken Nitroglycerin? No   Pt c/o Shortness Of Breath: STAT if SOB developed within the last 24 hours or pt is noticeably SOB on the phone  1. Are you currently SOB (can you hear that pt is SOB on the phone)? no  2. How long have you been experiencing SOB? 2-3 weeks  3. Are you SOB when sitting or when up moving around? Moving around  4. Are you currently experiencing any other symptoms? Chest pain    Pt sent message to pt scheduling to schedule an appt for increased SOB and chest pain. Pt scheduled with Coletta Memos on 09/28/20 at 10:15am. See message below:  "I've been having increased shortness of breath and occasional chest pains over the past few weeks when I engage in any activity. I was told I needed to contact you if I had worsening symptoms.  I don't have any significant swelling, but my breathing is a lot worse. I would prefer an appointment as soon as possible to address this. If it is possible to get in next Wednesday or Thursday that would be great, but I can be flexible depending on availability. Thank you." ?

## 2020-09-22 NOTE — Telephone Encounter (Signed)
Patient was returning a call from one of our Nurses. Patient was not sure what was the reason for the call was. She is aware of her appointment next week. If the office needs to call her please do so.

## 2020-09-22 NOTE — Telephone Encounter (Signed)
Left message to call back  

## 2020-09-26 NOTE — Progress Notes (Signed)
Cardiology Clinic Note   Patient Name: Christine [name] Date of Encounter: 09/28/2020  Primary Care Provider:  Maude Leriche, PA-C Primary Cardiologist:  Christine Hew, MD  Patient Profile    Christine [name] 45 year old female presents the clinic today for an evaluation of her shortness of breath and chest pain.  Past Medical History    Past Medical History:  Diagnosis Date  . Anemia    Presumably from menorrhagia/menomenorrhagia; has had both blood and iron transfusions  . Anxiety   . Back pain   . Bipolar 1 disorder ([place])    With depression  . Depression   . Essential hypertension   . Fibromyalgia   . Headache   . History of blood transfusion 08/2017   [facility]  . History of hiatal hernia   . Infertility, female   . Lactose intolerance   . Lower extremity edema   . Migraines   . Morbid obesity ([place])   . Osteoarthritis    Hands, ankles; HLA-B27 positive  . PCOS (polycystic ovarian syndrome)   . Seasonal allergies    With recurrent allergic rhinitis  . Uterine leiomyoma   . Vitamin D deficiency    Past Surgical History:  Procedure Laterality Date  . CARDIAC CT ANGIOGRAM  03/2019    Ca Score 0. Normal Coronary origin R dominant. No evidence of CAD. ? Liver nodules - poorly visualized (consider MRI w & w/o Gad contrast)  . CERVICAL POLYPECTOMY  01/15/2018   Procedure: CERVICAL POLYPECTOMY;  Surgeon: Arvella Nigh, MD;  Location: [facility];  Service: Gynecology;;  . COLONOSCOPY  2008   Normal  . DIAGNOSTIC LAPAROSCOPY  08/1999   dermoid cyst, [facility]  . DILATION AND CURETTAGE OF UTERUS  05/2004   [facility]  . DILATION AND CURETTAGE OF UTERUS  04/2015  . HYSTEROSCOPY WITH D & C  07/22/2011   Procedure: DILATATION AND CURETTAGE /HYSTEROSCOPY;  Surgeon: Cyril Mourning, MD;  Location: [facility];  Service: Gynecology;;  . HYSTEROSCOPY WITH D & C N/A 01/15/2018   Procedure: DILATATION AND CURETTAGE [place] WITH MYOSURE;  Surgeon: Arvella Nigh, MD;  Location: [facility];  Service:  Gynecology;  Laterality: N/A;  . 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 TRANSTHORACIC ECHOCARDIOGRAM  08/2017    EF 65 to 70% with vigorous wall motion.  Suggestion of high cardiac output (possibly related to anemia thyrotoxicosis, Pregnancy, sepsis etc.) -- was in setting of symptomatic anemia - Hgb 5.7    Allergies  Allergies  Allergen Reactions  . Penicillins Rash and Other (See Comments)    Has patient had a PCN reaction causing immediate rash, facial/tongue/throat swelling, SOB or lightheadedness with hypotension: Yes Has patient had a PCN reaction causing severe rash involving mucus membranes or skin necrosis: Unknown Has patient had a PCN reaction that required hospitalization: No Has patient had a PCN reaction occurring within the last 10 years: No If all of the above answers are "NO", then may proceed with Cephalosporin use.     History of Present Illness    Christine [name] has a PMH of hypertension, PCOS, pulmonary hypertension with sleep apnea, and bipolar disorder.  She was admitted to the hospital with shortness of breath and chest pain January 2021.  It was felt this was due to dietary indiscretion pertaining to salt.  She received IV diuresis and was discharged on low-dose amlodipine, and started on HCTZ.  She followed up with neurology.  It was also recommended that she follow-up with her PCP  and psychiatry.  She was seen in the office 01/17/2020 by Jory Sims, DNP.  She appeared nervous at the time of the visit and reported she was uncomfortable in medical offices.  She was tachycardic and slightly hypertensive.  She reported that she normally would take clonazepam prior to medical visits however she had forgotten to take it today.  She continued to have some dyspnea on exertion which she felt was related to her weight.  She asked about beginning an exercise program but was concerned about how it affected her heart.  She reported medical compliance.  She reported  that she was seeing her PCP for diabetes management.  She presents the clinic today for follow-up evaluation states over the last week she has noticed occasional episodes of sharp chest pain.  The pain lasts for 20 minutes - 1 hour.  She also reports some heaviness with the episodes.  She notices the symptoms both with and without physical activity.  She reports that in the past she has had similar symptoms as her hemoglobin decreases.  She reports she is taking p.o. iron however, she struggles with this because it upsets her stomach.  She reports that she tolerated iron infusions much better.  She also notes heavy periods.  I will order a CBC today, BMP, have her continue her iron, I have asked her to follow-up with PCP about resuming iron infusions based on her hemoglobin, and will have her follow-up in 1 month.  We reviewed her echocardiogram and she expressed understanding.  EKG today shows sinus tachycardia 111 bpm  Today she denies chest pain, shortness of breath, lower extremity edema, fatigue, palpitations, melena, hematuria, hemoptysis, diaphoresis, weakness, presyncope, syncope, orthopnea, and PND.   Home Medications    Prior to Admission medications   Medication Sig Start Date End Date Taking? Authorizing Provider  acetaminophen (TYLENOL) 325 MG tablet Take 650 mg by mouth every 6 (six) hours as needed for mild pain or headache.    [provider]  amLODipine (NORVASC) 5 MG tablet Take 1 tablet (5 mg total) by mouth daily. 08/10/19 08/09/20  Jennye Boroughs, MD  ARIPiprazole (ABILIFY) 15 MG tablet Take 1 tablet (15 mg total) by mouth daily. 09/01/20 09/01/21  [name], [name], MD  clonazePAM (KLONOPIN) 0.5 MG tablet Take 1 tablet (0.5 mg total) by mouth daily as needed for anxiety. 09/01/20   [name], [name], MD  hydrochlorothiazide (MICROZIDE) 12.5 MG capsule Take 1 capsule (12.5 mg total) by mouth daily. 08/11/19 08/10/20  Jennye Boroughs, MD  lamoTRIgine (LAMICTAL) 150 MG tablet Take 1 tablet  (150 mg total) by mouth 2 (two) times daily. For mood 09/01/20   [name], [name], MD  lithium carbonate (ESKALITH) 450 MG CR tablet Take 1 tablet (450 mg total) by mouth 2 (two) times daily. 09/01/20 09/01/21  [name], [name], MD  metFORMIN (GLUCOPHAGE) 500 MG tablet Take 1 tablet (500 mg total) by mouth 2 (two) times daily with a meal. 10/20/19   [name], MD  metoprolol tartrate (LOPRESSOR) 100 MG tablet metoprolol tartrate 100 mg tablet  Take 1 tablet twice a day by oral route.    Scifres, Dorothy, PA-C    Family History    Family History  Problem Relation Age of Onset  . Anxiety disorder Mother   . Depression Mother   . Cancer Mother        pancreatic   . High blood pressure Mother   . Hypertension Mother   . Hyperlipidemia Mother   .  Cancer Father        colon  . High blood pressure Father   . Diabetes Father   . Hypertension Father   . Hyperlipidemia Father   . Heart disease Father   . Stroke Father   . Kidney disease Father   . Sleep apnea Father   . Obesity Father   . Cancer Brother 6       leukemia, childhood  . High blood pressure Brother   . Migraines Neg Hx    She indicated that her mother is deceased. She indicated that her father is alive. She indicated that her brother is alive. She indicated that the status of her neg hx is unknown.  Social History    Social History   Socioeconomic History  . Marital status: Married    Spouse name: Allyah Heather  . Number of children: 2  . Years of education: 2 years of grad school + bach & HS  . Highest education level: Bachelor's degree (e.g., BA, AB, BS)  Occupational History  . Occupation: disabled  Tobacco Use  . Smoking status: Never Smoker  . Smokeless tobacco: Never Used  Vaping Use  . Vaping Use: Never used  Substance and Sexual Activity  . Alcohol use: Yes    Comment: occasional, maybe once a month or so  . Drug use: No  . Sexual activity: Yes    Partners: Male    Birth control/protection: None   Other Topics Concern  . Not on file  Social History Narrative   Lives at home with her husband and 2 adopted daughters.   Right handed   Caffeine: decreased intake, drinks rarely    Is on long-term disability.   Social Determinants of Health   Financial Resource Strain: Not on file  Food Insecurity: Not on file  Transportation Needs: Not on file  Physical Activity: Not on file  Stress: Not on file  Social Connections: Not on file  Intimate Partner Violence: Not on file     Review of Systems    General:  No chills, fever, night sweats or weight changes.  Cardiovascular:  No chest pain, dyspnea on exertion, edema, orthopnea, palpitations, paroxysmal nocturnal dyspnea. Dermatological: No rash, lesions/masses Respiratory: No cough, dyspnea Urologic: No hematuria, dysuria Abdominal:   No nausea, vomiting, diarrhea, bright red blood per rectum, melena, or hematemesis Neurologic:  No visual changes, wkns, changes in mental status. All other systems reviewed and are otherwise negative except as noted above.  Physical Exam    VS:  BP 132/84 (BP Location: Left Arm, Patient Position: Sitting, Cuff Size: Large)   Pulse (!) 115   Wt (!) 349 lb 9.6 oz (158.6 kg)   SpO2 96%   BMI 60.01 kg/m  , BMI Body mass index is 60.01 kg/m. GEN: Well nourished, well developed, in no acute distress. HEENT: normal. Neck: Supple, no JVD, carotid bruits, or masses. Cardiac: RRR, no murmurs, rubs, or gallops. No clubbing, cyanosis, edema.  Radials/DP/PT 2+ and equal bilaterally.  Respiratory:  Respirations regular and unlabored, clear to auscultation bilaterally. GI: Soft, nontender, nondistended, BS + x 4. MS: no deformity or atrophy. Skin: warm and dry, no rash. Neuro:  Strength and sensation are intact. Psych: Normal affect.  Accessory Clinical Findings    Recent Labs: No results found for requested labs within last 8760 hours.   Recent Lipid Panel    Component Value Date/Time   CHOL  183 10/07/2018 1220   TRIG 70 10/07/2018 1220  HDL 38 (L) 10/07/2018 1220   LDLCALC 131 (H) 10/07/2018 1220    ECG personally reviewed by me today-sinus tachycardia septal infarct undetermined age 34 bpm  Echocardiogram 08/09/2019 1. Left ventricular ejection fraction, by visual estimation, is 60 to  65%. The left ventricle has normal function. There is no left ventricular  hypertrophy.  2. The left ventricle has no regional wall motion abnormalities.  3. Global right ventricle has normal systolic function.The right  ventricular size is normal. No increase in right ventricular wall  thickness.  4. Left atrial size was normal.  5. Right atrial size was normal.  6. The mitral valve is normal in structure. No evidence of mitral valve  regurgitation.  7. The tricuspid valve is normal in structure.  8. The aortic valve is normal in structure. Aortic valve regurgitation is  not visualized.  9. The pulmonic valve was normal in structure. Pulmonic valve  regurgitation is not visualized.  10. The atrial septum is grossly normal.   Assessment & Plan   1.  Essential hypertension-BP today 132/84.  Well-controlled at home. Continue amlodipine, hydrochlorothiazide, metoprolol Heart healthy low-sodium diet-salty 6 given Increase physical activity as tolerated  Dyspnea/OSA-reports compliance with BiPAP.  Slowly progressing her physical activity and tolerating it well.  Morbid obesity-weight today 349.6.  Continues to watch her diet. Continue weight loss Heart healthy low-sodium diet-salty 6 given Increase physical activity as tolerated  Type 2 diabetes-glucose 102 on 08/10/2019. Continue Metformin Follows with PCP  Disposition: Follow-up with Dr. Ellyn Hack or Jory Sims in 1 months.  Jossie Ng. Keny Donald NP-C    09/28/2020, 10:44 AM [place] [address] Office [phone] Fax [phone]  Notice: This dictation was prepared  with Dragon dictation along with smaller phrase technology. Any transcriptional errors that result from this process are unintentional and may not be corrected upon review.  I spent 15 minutes examining this patient, reviewing medications, and using patient centered shared decision making involving her cardiac care.  Prior to her visit I spent greater than 20 minutes reviewing her past medical history,  medications, and prior cardiac tests.

## 2020-09-27 NOTE — Telephone Encounter (Signed)
Patient has appointment on 02/24 with Coletta Memos, NP- aware of appointment.  Will address concerns during this visit.   Will remove from triage.

## 2020-09-28 ENCOUNTER — Other Ambulatory Visit: Payer: Self-pay

## 2020-09-28 ENCOUNTER — Encounter: Payer: Self-pay | Admitting: General Practice

## 2020-09-28 ENCOUNTER — Ambulatory Visit (INDEPENDENT_AMBULATORY_CARE_PROVIDER_SITE_OTHER): Payer: Medicare Other | Admitting: General Practice

## 2020-09-28 VITALS — BP 132/84 | HR 115 | Wt 349.6 lb

## 2020-09-28 DIAGNOSIS — Z79899 Other long term (current) drug therapy: Secondary | ICD-10-CM

## 2020-09-28 DIAGNOSIS — E0800 Diabetes mellitus due to underlying condition with hyperosmolarity without nonketotic hyperglycemic-hyperosmolar coma (NKHHC): Secondary | ICD-10-CM

## 2020-09-28 DIAGNOSIS — I10 Essential (primary) hypertension: Secondary | ICD-10-CM

## 2020-09-28 DIAGNOSIS — G4733 Obstructive sleep apnea (adult) (pediatric): Secondary | ICD-10-CM | POA: Diagnosis not present

## 2020-09-28 LAB — CBC
Hematocrit: 30.9 % — ABNORMAL LOW (ref 34.0–46.6)
Hemoglobin: 9.2 g/dL — ABNORMAL LOW (ref 11.1–15.9)
MCH: 22.7 pg — ABNORMAL LOW (ref 26.6–33.0)
MCHC: 29.8 g/dL — ABNORMAL LOW (ref 31.5–35.7)
MCV: 76 fL — ABNORMAL LOW (ref 79–97)
Platelets: 350 10*3/uL (ref 150–450)
RBC: 4.05 x10E6/uL (ref 3.77–5.28)
RDW: 17 % — ABNORMAL HIGH (ref 11.7–15.4)
WBC: 10.4 10*3/uL (ref 3.4–10.8)

## 2020-09-28 LAB — BASIC METABOLIC PANEL
BUN/Creatinine Ratio: 15 (ref 9–23)
BUN: 12 mg/dL (ref 6–24)
CO2: 21 mmol/L (ref 20–29)
Calcium: 9.9 mg/dL (ref 8.7–10.2)
Chloride: 102 mmol/L (ref 96–106)
Creatinine, Ser: 0.81 mg/dL (ref 0.57–1.00)
GFR calc Af Amer: 103 mL/min/{1.73_m2} (ref >59)
GFR calc non Af Amer: 89 mL/min/{1.73_m2} (ref >59)
Glucose: 108 mg/dL — ABNORMAL HIGH (ref 65–99)
Potassium: 4 mmol/L (ref 3.5–5.2)
Sodium: 138 mmol/L (ref 134–144)

## 2020-09-28 NOTE — Patient Instructions (Signed)
Medication Instructions:  The current medical regimen is effective;  continue present plan and medications as directed. Please refer to the Current Medication list given to you today.  *If you need a refill on your cardiac medications before your next appointment, please call your pharmacy*  Lab Work: [place] If you have labs (blood work) drawn today and your tests are completely normal, you will receive your results only by:  [place] (if you have MyChart) OR A paper copy in the mail.  If you have any lab test that is abnormal or we need to change your treatment, we will call you to review the results. You may go to any [facility] that is convenient for you however, we do have a lab in our office that is able to assist you. You DO NOT need an appointment for our lab. The lab is open 8:00am and closes at 4:00pm. Lunch 12:45 - 1:45pm.  Testing/Procedures: NONE  Special Instructions PLEASE READ AND FOLLOW SALTY 6-ATTACHED-1,800 mg daily  PLEASE INCREASE PHYSICAL ACTIVITY AS TOLERATED  Follow-Up: Your next appointment:  1 month(s) In Person with Glenetta Hew, MD OR IF UNAVAILABLE [place], FNP-C   Please call our office 2 months in advance to schedule this appointment   At [facility], you and your health needs are our priority.  As part of our continuing mission to provide you with exceptional heart care, we have created designated Provider Care Teams.  These Care Teams include your primary Cardiologist (physician) and Advanced Practice Providers (APPs -  Physician Assistants and Nurse Practitioners) who all work together to provide you with the care you need, when you need it.

## 2020-10-04 ENCOUNTER — Telehealth (HOSPITAL_COMMUNITY): Payer: Medicare Other | Admitting: Psychiatry

## 2020-10-06 ENCOUNTER — Encounter: Payer: Self-pay | Admitting: General Practice

## 2020-10-06 NOTE — Addendum Note (Signed)
Addended by: Waylan Rocher on: 10/06/2020 02:21 PM   Modules accepted: Orders

## 2020-10-20 ENCOUNTER — Telehealth: Payer: Self-pay | Admitting: Nurse Practitioner

## 2020-10-20 NOTE — Telephone Encounter (Signed)
Patient called in to reschedule 3/21 appt due to schedule conflicts. Confirmed new date and time

## 2020-10-23 ENCOUNTER — Inpatient Hospital Stay: Payer: Medicare Other

## 2020-10-23 ENCOUNTER — Inpatient Hospital Stay: Payer: Medicare Other | Admitting: Nurse Practitioner

## 2020-10-30 NOTE — Progress Notes (Deleted)
Cardiology Clinic Note   Patient Name: Christine [name] Date of Encounter: 10/30/2020  Primary Care Provider:  Maude Leriche, PA-C Primary Cardiologist:  Christine Hew, MD  Patient Profile    ***  Past Medical History    Past Medical History:  Diagnosis Date  . Anemia    Presumably from menorrhagia/menomenorrhagia; has had both blood and iron transfusions  . Anxiety   . Back pain   . Bipolar 1 disorder ([place])    With depression  . Depression   . Essential hypertension   . Fibromyalgia   . Headache   . History of blood transfusion 08/2017   [facility]  . History of hiatal hernia   . Infertility, female   . Lactose intolerance   . Lower extremity edema   . Migraines   . Morbid obesity ([place])   . Osteoarthritis    Hands, ankles; HLA-B27 positive  . PCOS (polycystic ovarian syndrome)   . Seasonal allergies    With recurrent allergic rhinitis  . Uterine leiomyoma   . Vitamin D deficiency    Past Surgical History:  Procedure Laterality Date  . CARDIAC CT ANGIOGRAM  03/2019    Ca Score 0. Normal Coronary origin R dominant. No evidence of CAD. ? Liver nodules - poorly visualized (consider MRI w & w/o Gad contrast)  . CERVICAL POLYPECTOMY  01/15/2018   Procedure: CERVICAL POLYPECTOMY;  Surgeon: Arvella Nigh, MD;  Location: [facility];  Service: Gynecology;;  . COLONOSCOPY  2008   Normal  . DIAGNOSTIC LAPAROSCOPY  08/1999   dermoid cyst, RSO  . DILATION AND CURETTAGE OF UTERUS  05/2004   MAB  . DILATION AND CURETTAGE OF UTERUS  04/2015  . HYSTEROSCOPY WITH D & C  07/22/2011   Procedure: DILATATION AND CURETTAGE /HYSTEROSCOPY;  Surgeon: Cyril Mourning, MD;  Location: [facility];  Service: Gynecology;;  . HYSTEROSCOPY WITH D & C N/A 01/15/2018   Procedure: DILATATION AND CURETTAGE [place] WITH MYOSURE;  Surgeon: Arvella Nigh, MD;  Location: [facility];  Service: Gynecology;  Laterality: N/A;  . 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 TRANSTHORACIC ECHOCARDIOGRAM  08/2017     EF 65 to 70% with vigorous wall motion.  Suggestion of high cardiac output (possibly related to anemia thyrotoxicosis, Pregnancy, sepsis etc.) -- was in setting of symptomatic anemia - Hgb 5.7    Allergies  Allergies  Allergen Reactions  . Penicillins Rash and Other (See Comments)    Has patient had a PCN reaction causing immediate rash, facial/tongue/throat swelling, SOB or lightheadedness with hypotension: Yes Has patient had a PCN reaction causing severe rash involving mucus membranes or skin necrosis: Unknown Has patient had a PCN reaction that required hospitalization: No Has patient had a PCN reaction occurring within the last 10 years: No If all of the above answers are "NO", then may proceed with Cephalosporin use.     History of Present Illness    ***  Home Medications    Prior to Admission medications   Medication Sig Start Date End Date Taking? Authorizing Provider  acetaminophen (TYLENOL) 325 MG tablet Take 650 mg by mouth every 6 (six) hours as needed for mild pain or headache.    [provider]  amLODipine (NORVASC) 5 MG tablet Take 1 tablet (5 mg total) by mouth daily. 08/10/19 08/09/20  Jennye Boroughs, MD  ARIPiprazole (ABILIFY) 15 MG tablet Take 1 tablet (15 mg total) by mouth daily. 09/01/20 09/01/21  [name], [name], MD  clonazePAM (  KLONOPIN) 0.5 MG tablet Take 1 tablet (0.5 mg total) by mouth daily as needed for anxiety. 09/01/20   [name], [name], MD  hydrochlorothiazide (MICROZIDE) 12.5 MG capsule Take 1 capsule (12.5 mg total) by mouth daily. 08/11/19 08/10/20  Jennye Boroughs, MD  lamoTRIgine (LAMICTAL) 150 MG tablet Take 1 tablet (150 mg total) by mouth 2 (two) times daily. For mood 09/01/20   [name], [name], MD  lithium carbonate (ESKALITH) 450 MG CR tablet Take 1 tablet (450 mg total) by mouth 2 (two) times daily. 09/01/20 09/01/21  [name], [name], MD  metFORMIN (GLUCOPHAGE) 500 MG tablet Take 1 tablet (500 mg total) by mouth 2 (two) times daily with a meal. 10/20/19    [name], MD  metoprolol tartrate (LOPRESSOR) 100 MG tablet metoprolol tartrate 100 mg tablet  Take 1 tablet twice a day by oral route.    Scifres, Dorothy, PA-C    Family History    Family History  Problem Relation Age of Onset  . Anxiety disorder Mother   . Depression Mother   . Cancer Mother        pancreatic   . High blood pressure Mother   . Hypertension Mother   . Hyperlipidemia Mother   . Cancer Father        colon  . High blood pressure Father   . Diabetes Father   . Hypertension Father   . Hyperlipidemia Father   . Heart disease Father   . Stroke Father   . Kidney disease Father   . Sleep apnea Father   . Obesity Father   . Cancer Brother 6       leukemia, childhood  . High blood pressure Brother   . Migraines Neg Hx    She indicated that her mother is deceased. She indicated that her father is alive. She indicated that her brother is alive. She indicated that the status of her neg hx is unknown.  Social History    Social History   Socioeconomic History  . Marital status: Married    Spouse name: Christine [name]  . Number of children: 2  . Years of education: 2 years of grad school + bach & HS  . Highest education level: Bachelor's degree (e.g., BA, AB, BS)  Occupational History  . Occupation: disabled  Tobacco Use  . Smoking status: Never Smoker  . Smokeless tobacco: Never Used  Vaping Use  . Vaping Use: Never used  Substance and Sexual Activity  . Alcohol use: Yes    Comment: occasional, maybe once a month or so  . Drug use: No  . Sexual activity: Yes    Partners: Male    Birth control/protection: None  Other Topics Concern  . Not on file  Social History Narrative   ** Merged History Encounter **       Lives at home with her husband and 2 adopted daughters. Right handed Caffeine: decreased intake, drinks rarely  Is on long-term disability.   Social Determinants of Health   Financial Resource Strain: Not on file  Food  Insecurity: Not on file  Transportation Needs: Not on file  Physical Activity: Not on file  Stress: Not on file  Social Connections: Not on file  Intimate Partner Violence: Not on file     Review of Systems    General:  No chills, fever, night sweats or weight changes.  Cardiovascular:  No chest pain, dyspnea on exertion, edema, orthopnea, palpitations, paroxysmal nocturnal dyspnea. Dermatological: No rash,  lesions/masses Respiratory: No cough, dyspnea Urologic: No hematuria, dysuria Abdominal:   No nausea, vomiting, diarrhea, bright red blood per rectum, melena, or hematemesis Neurologic:  No visual changes, wkns, changes in mental status. All other systems reviewed and are otherwise negative except as noted above.  Physical Exam    VS:  There were no vitals taken for this visit. , BMI There is no height or weight on file to calculate BMI. GEN: Well nourished, well developed, in no acute distress. HEENT: normal. Neck: Supple, no JVD, carotid bruits, or masses. Cardiac: RRR, no murmurs, rubs, or gallops. No clubbing, cyanosis, edema.  Radials/DP/PT 2+ and equal bilaterally.  Respiratory:  Respirations regular and unlabored, clear to auscultation bilaterally. GI: Soft, nontender, nondistended, BS + x 4. MS: no deformity or atrophy. Skin: warm and dry, no rash. Neuro:  Strength and sensation are intact. Psych: Normal affect.  Accessory Clinical Findings    Recent Labs: 09/28/2020: BUN 12; Creatinine, Ser 0.81; Hemoglobin 9.2; Platelets 350; Potassium 4.0; Sodium 138   Recent Lipid Panel    Component Value Date/Time   CHOL 183 10/07/2018 1220   TRIG 70 10/07/2018 1220   HDL 38 (L) 10/07/2018 1220   LDLCALC 131 (H) 10/07/2018 1220    ECG personally reviewed by me today- *** - No acute changes  Assessment & Plan   1.  ***   Jossie Ng. Hanifah Royse NP-C    10/30/2020, 8:44 AM [place] [address] Office [phone] Fax [phone]  Notice: This dictation was prepared with Dragon dictation along with smaller phrase technology. Any transcriptional errors that result from this process are unintentional and may not be corrected upon review.  I spent***minutes examining this patient, reviewing medications, and using patient centered shared decision making involving her cardiac care.  Prior to her visit I spent greater than 20 minutes reviewing her past medical history,  medications, and prior cardiac tests.

## 2020-10-31 ENCOUNTER — Ambulatory Visit: Payer: Medicare Other | Admitting: General Practice

## 2020-11-01 ENCOUNTER — Other Ambulatory Visit: Payer: Self-pay | Admitting: Cardiology

## 2020-11-01 ENCOUNTER — Encounter: Payer: Self-pay | Admitting: Nurse Practitioner

## 2020-11-01 ENCOUNTER — Inpatient Hospital Stay (HOSPITAL_BASED_OUTPATIENT_CLINIC_OR_DEPARTMENT_OTHER): Payer: Medicare Other | Admitting: Nurse Practitioner

## 2020-11-01 ENCOUNTER — Inpatient Hospital Stay: Payer: Medicare Other | Attending: Nurse Practitioner

## 2020-11-01 ENCOUNTER — Other Ambulatory Visit: Payer: Self-pay

## 2020-11-01 ENCOUNTER — Inpatient Hospital Stay: Payer: Medicare Other

## 2020-11-01 VITALS — BP 129/68 | HR 97 | Resp 18

## 2020-11-01 VITALS — BP 183/70 | HR 125 | Temp 99.2°F | Resp 18 | Ht 64.0 in | Wt 347.2 lb

## 2020-11-01 DIAGNOSIS — D500 Iron deficiency anemia secondary to blood loss (chronic): Secondary | ICD-10-CM | POA: Diagnosis not present

## 2020-11-01 DIAGNOSIS — N920 Excessive and frequent menstruation with regular cycle: Secondary | ICD-10-CM | POA: Insufficient documentation

## 2020-11-01 LAB — CBC WITH DIFFERENTIAL (CANCER CENTER ONLY)
Abs Immature Granulocytes: 0.09 10*3/uL — ABNORMAL HIGH (ref 0.00–0.07)
Basophils Absolute: 0 10*3/uL (ref 0.0–0.1)
Basophils Relative: 0 %
Eosinophils Absolute: 0.1 10*3/uL (ref 0.0–0.5)
Eosinophils Relative: 1 %
HCT: 29.1 % — ABNORMAL LOW (ref 36.0–46.0)
Hemoglobin: 8.5 g/dL — ABNORMAL LOW (ref 12.0–15.0)
Immature Granulocytes: 1 %
Lymphocytes Relative: 21 %
Lymphs Abs: 1.8 10*3/uL (ref 0.7–4.0)
MCH: 21.6 pg — ABNORMAL LOW (ref 26.0–34.0)
MCHC: 29.2 g/dL — ABNORMAL LOW (ref 30.0–36.0)
MCV: 73.9 fL — ABNORMAL LOW (ref 80.0–100.0)
Monocytes Absolute: 0.7 10*3/uL (ref 0.1–1.0)
Monocytes Relative: 8 %
Neutro Abs: 6 10*3/uL (ref 1.7–7.7)
Neutrophils Relative %: 69 %
Platelet Count: 329 10*3/uL (ref 150–400)
RBC: 3.94 MIL/uL (ref 3.87–5.11)
RDW: 18.7 % — ABNORMAL HIGH (ref 11.5–15.5)
WBC Count: 8.8 10*3/uL (ref 4.0–10.5)
nRBC: 0.5 % — ABNORMAL HIGH (ref 0.0–0.2)

## 2020-11-01 LAB — CMP (CANCER CENTER ONLY)
ALT: 14 U/L (ref 0–44)
AST: 8 U/L — ABNORMAL LOW (ref 15–41)
Albumin: 3.8 g/dL (ref 3.5–5.0)
Alkaline Phosphatase: 69 U/L (ref 38–126)
Anion gap: 8 (ref 5–15)
BUN: 13 mg/dL (ref 6–20)
CO2: 22 mmol/L (ref 22–32)
Calcium: 8.9 mg/dL (ref 8.9–10.3)
Chloride: 108 mmol/L (ref 98–111)
Creatinine: 0.89 mg/dL (ref 0.44–1.00)
GFR, Estimated: >60 mL/min (ref >60)
Glucose, Bld: 135 mg/dL — ABNORMAL HIGH (ref 70–99)
Potassium: 3.7 mmol/L (ref 3.5–5.1)
Sodium: 138 mmol/L (ref 135–145)
Total Bilirubin: 0.5 mg/dL (ref 0.3–1.2)
Total Protein: 7.4 g/dL (ref 6.5–8.1)

## 2020-11-01 LAB — IRON AND TIBC
Iron: 28 ug/dL — ABNORMAL LOW (ref 41–142)
Saturation Ratios: 7 % — ABNORMAL LOW (ref 21–57)
TIBC: 416 ug/dL (ref 236–444)
UIBC: 388 ug/dL — ABNORMAL HIGH (ref 120–384)

## 2020-11-01 LAB — FERRITIN: Ferritin: 5 ng/mL — ABNORMAL LOW (ref 11–307)

## 2020-11-01 MED ORDER — DIPHENHYDRAMINE HCL 50 MG/ML IJ SOLN
INTRAMUSCULAR | Status: AC
  Filled 2020-11-01: qty 1

## 2020-11-01 MED ORDER — FAMOTIDINE IN NACL 20-0.9 MG/50ML-% IV SOLN
INTRAVENOUS | Status: AC
  Filled 2020-11-01: qty 50

## 2020-11-01 MED ORDER — DIPHENHYDRAMINE HCL 50 MG/ML IJ SOLN
12.5000 mg | Freq: Once | INTRAMUSCULAR | Status: AC
  Administered 2020-11-01: 12.5 mg via INTRAVENOUS

## 2020-11-01 MED ORDER — SODIUM CHLORIDE 0.9 % IV SOLN
Freq: Once | INTRAVENOUS | Status: AC
  Filled 2020-11-01: qty 250

## 2020-11-01 MED ORDER — SODIUM CHLORIDE 0.9 % IV SOLN
510.0000 mg | Freq: Once | INTRAVENOUS | Status: AC
  Administered 2020-11-01: 510 mg via INTRAVENOUS
  Filled 2020-11-01: qty 510

## 2020-11-01 MED ORDER — FAMOTIDINE IN NACL 20-0.9 MG/50ML-% IV SOLN
20.0000 mg | Freq: Two times a day (BID) | INTRAVENOUS | Status: DC
  Administered 2020-11-01: 20 mg via INTRAVENOUS

## 2020-11-01 NOTE — Progress Notes (Signed)
[place]   [phone] [phone]   Clinic Follow up Note   Patient Care Team: Scifres, Durel Salts as PCP - General (Physician Assistant) [name], MD as PCP - Cardiology (Cardiology) 11/01/2020  CHIEF COMPLAINT: Follow up IDA  CURRENT THERAPY: IV Feraheme PRN, last given 01/29/2018, 02/2019 x2, 03/2019, 04/2019  INTERVAL HISTORY: Christine [name] returns for follow up as scheduled. She was last seen 05/31/19, last Feraheme 06/08/19. Her anemia resolved to Hg 12.0 in 08/2019. No labs in the interval until 09/28/20 Hg dropped to 9.2 with low MCV.  She has been trying to tolerate oral iron in the interim, but she is becoming intolerant.  For the past 3 months she has had significant dyspnea on exertion, she was seen by cardiology.  She denies cough, but has occasional sharp chest pains although none today.  These occur randomly, at rest, and not usually with exertion.  She has been occasionally lightheaded, no fall.  Been able to eat and drink.  Denies recent infection.  She continues to have heavy menses, LMP 10/14/2020.  Bleeding is heavy for 5 days, changing hygiene products every 1-2 hours.  She needs to follow-up with GYN, still debating about hysterectomy. Not on OCP.  she stopped NSAIDs in the last 6-8 months.  Denies other bleeding such as epistaxis or hematochezia. She is stopping work due to her mental health, could not handle stress of self care with little flexibility from her job. She is taking time off for self care now.    MEDICAL HISTORY:  Past Medical History:  Diagnosis Date  . Anemia    Presumably from menorrhagia/menomenorrhagia; has had both blood and iron transfusions  . Anxiety   . Back pain   . Bipolar 1 disorder ([place])    With depression  . Depression   . Essential hypertension   . Fibromyalgia   . Headache   . History of blood transfusion 08/2017   [facility]  . History of hiatal hernia   . Infertility, female   . Lactose  intolerance   . Lower extremity edema   . Migraines   . Morbid obesity ([place])   . Osteoarthritis    Hands, ankles; HLA-B27 positive  . PCOS (polycystic ovarian syndrome)   . Seasonal allergies    With recurrent allergic rhinitis  . Uterine leiomyoma   . Vitamin D deficiency     SURGICAL HISTORY: Past Surgical History:  Procedure Laterality Date  . CARDIAC CT ANGIOGRAM  03/2019    Ca Score 0. Normal Coronary origin R dominant. No evidence of CAD. ? Liver nodules - poorly visualized (consider MRI w & w/o Gad contrast)  . CERVICAL POLYPECTOMY  01/15/2018   Procedure: CERVICAL POLYPECTOMY;  Surgeon: Arvella Nigh, MD;  Location: [facility];  Service: Gynecology;;  . COLONOSCOPY  2008   Normal  . DIAGNOSTIC LAPAROSCOPY  08/1999   dermoid cyst, RSO  . DILATION AND CURETTAGE OF UTERUS  05/2004   MAB  . DILATION AND CURETTAGE OF UTERUS  04/2015  . HYSTEROSCOPY WITH D & C  07/22/2011   Procedure: DILATATION AND CURETTAGE /HYSTEROSCOPY;  Surgeon: Cyril Mourning, MD;  Location: [facility];  Service: Gynecology;;  . HYSTEROSCOPY WITH D & C N/A 01/15/2018   Procedure: DILATATION AND CURETTAGE [place] WITH MYOSURE;  Surgeon: Arvella Nigh, MD;  Location: [facility];  Service: Gynecology;  Laterality: N/A;  . 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 TRANSTHORACIC ECHOCARDIOGRAM  08/2017  EF 65 to 70% with vigorous wall motion.  Suggestion of high cardiac output (possibly related to anemia thyrotoxicosis, Pregnancy, sepsis etc.) -- was in setting of symptomatic anemia - Hgb 5.7    I have reviewed the social history and family history with the patient and they are unchanged from previous note.  ALLERGIES:  is allergic to penicillins.  MEDICATIONS:  Current Outpatient Medications  Medication Sig Dispense Refill  . acetaminophen (TYLENOL) 325 MG tablet Take 650 mg by mouth every 6 (six) hours as needed for mild pain or headache.    [name] amLODipine (NORVASC) 5 MG tablet Take 1 tablet (5 mg total)  by mouth daily. 30 tablet 0  . ARIPiprazole (ABILIFY) 15 MG tablet Take 1 tablet (15 mg total) by mouth daily. 30 tablet 0  . clonazePAM (KLONOPIN) 0.5 MG tablet Take 1 tablet (0.5 mg total) by mouth daily as needed for anxiety. 30 tablet 0  . hydrochlorothiazide (MICROZIDE) 12.5 MG capsule Take 1 capsule (12.5 mg total) by mouth daily. 30 capsule 0  . lamoTRIgine (LAMICTAL) 150 MG tablet Take 1 tablet (150 mg total) by mouth 2 (two) times daily. For mood 60 tablet 0  . lithium carbonate (ESKALITH) 450 MG CR tablet Take 1 tablet (450 mg total) by mouth 2 (two) times daily. 60 tablet 0  . metFORMIN (GLUCOPHAGE) 500 MG tablet Take 1 tablet (500 mg total) by mouth 2 (two) times daily with a meal. 60 tablet 0  . metoprolol tartrate (LOPRESSOR) 100 MG tablet metoprolol tartrate 100 mg tablet  Take 1 tablet twice a day by oral route.     No current facility-administered medications for this visit.   Facility-Administered Medications Ordered in Other Visits  Medication Dose Route Frequency Provider Last Rate Last Admin  . famotidine (PEPCID) IVPB 20 mg premix  20 mg Intravenous Q12H Truitt Merle, MD 200 mL/hr at 11/01/20 1047 20 mg at 11/01/20 1047    PHYSICAL EXAMINATION:  Vitals:   11/01/20 0933  BP: (!) 183/70  Pulse: (!) 125  Resp: 18  Temp: 99.2 F (37.3 C)  SpO2: 99%   Filed Weights   11/01/20 0933  Weight: (!) 347 lb 3.2 oz (157.5 kg)    GENERAL:alert, no distress and comfortable SKIN: no rash. Dystrophic nails  EYES:  sclera clear LUNGS: clear with normal breathing effort HEART: tachycardic, regular rhythm, no lower extremity edema NEURO: alert & oriented x 3 with fluent speech, no focal motor/sensory deficits  LABORATORY DATA:  I have reviewed the data as listed CBC Latest Ref Rng & Units 11/01/2020 09/28/2020 08/10/2019  WBC 4.0 - 10.5 K/uL 8.8 10.4 10.1  Hemoglobin 12.0 - 15.0 g/dL 8.5(L) 9.2(L) 12.0  Hematocrit 36.0 - 46.0 % 29.1(L) 30.9(L) 39.7  Platelets 150 - 400 K/uL  329 350 316     CMP Latest Ref Rng & Units 11/01/2020 09/28/2020 08/10/2019  Glucose 70 - 99 mg/dL 135(H) 108(H) 102(H)  BUN 6 - 20 mg/dL $Remove'13 12 13  'XwfnrOF$ Creatinine 0.44 - 1.00 mg/dL 0.89 0.81 0.92  Sodium 135 - 145 mmol/L 138 138 138  Potassium 3.5 - 5.1 mmol/L 3.7 4.0 4.0  Chloride 98 - 111 mmol/L 108 102 100  CO2 22 - 32 mmol/L $RemoveB'22 21 28  '[id number]$ Calcium 8.9 - 10.3 mg/dL 8.9 9.9 9.5  Total Protein 6.5 - 8.1 g/dL 7.4 - -  Total Bilirubin 0.3 - 1.2 mg/dL 0.5 - -  Alkaline Phos 38 - 126 U/L 69 - -  AST 15 - 41 U/L  8(L) - -  ALT 0 - 44 U/L 14 - -      RADIOGRAPHIC STUDIES: I have personally reviewed the radiological images as listed and agreed with the findings in the report. No results found.   ASSESSMENT & PLAN: Christine [name] is a pleasant 45yo AAF with history of uterine fibroids, polyps, PCOS, obesity, HTN, and arthritis found to have severe iron deficiency anemia secondary to chronic blood loss   1. 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 menorrhagia -She required frequent IV Feraheme support in the past, from January- June 2019, then she lost to f/u -when she re-established in 02/2019, she presented with symptomatic anemia with Hg 7.8, labs revealed severe iron deficiency with ferritin <4, serum iron 18, 4% transferrin sat -She received monthly IV Feraheme from 02/2019 - 04/2019, she responded well and anemia resolved until she became symptomatic again with exertional dyspnea, fatigue, lightheadedness -11/01/2020 labs show Hg 8.5, serum iron 28, TIBC 416, 7% saturation, ferritin 5 consistent with severe IDA likely secondary to menorrhagia blood loss (monthly menses heavy x5 days) -Given intolerance to oral iron, plan to resume IV Feraheme with premeds weekly x2 for 2 months then reassess  2.HTN -on amlodipine, metoprolol, HCTZ  -Followed by Dr. Ellyn Hack   3.Overall health and wellness -Previously under care of weight loss wellness center, PCP, cardiology, and  Gyn -She has known menorrhagia and uterine fibroids, I recommend to follow-up with GYN soon to discuss ways to control menstrual bleeding -I also encouraged her to complete age-appropriate cancer screenings this year  4. Bipolar -On aripiprazole, clonazepam, lamotrigine, lithium -she is under mental health care and is getting more consistent with therapy lately -Due to limited flexibility with work and stress, she is on disability and not working   Disposition:  Christine [name] appears stable.  She has clinical evidence of recurrent IDA from menorrhagia, she is symptomatic. She plans to f/up with Gyn soon. We will check stool cards for occult blood to rule out other source of bleeding such as GI, if positive will refer for colonoscopy.   She is intolerant to oral iron, I recommend to resume IV Feraheme weekly x2 starting today.  She previously required several months of iron replacement but responded well in the past. Will plan accordingly.   Lab and feraheme weekly x2 3/30 and next week, then again in 1 and 2 months, f/up in 2 months   All questions were answered. The patient knows to call the clinic with any problems, questions or concerns. No barriers to learning were detected.      Alla Feeling, NP 11/01/20

## 2020-11-01 NOTE — Patient Instructions (Signed)
Ferumoxytol injection What is this medicine? FERUMOXYTOL is an iron complex. Iron is used to make healthy red blood cells, which carry oxygen and nutrients throughout the body. This medicine is used to treat iron deficiency anemia. This medicine may be used for other purposes; ask your health care provider or pharmacist if you have questions. COMMON BRAND NAME(S): Feraheme What should I tell my health care provider before I take this medicine? They need to know if you have any of these conditions:  anemia not caused by low iron levels  high levels of iron in the blood  magnetic resonance imaging (MRI) test scheduled  an unusual or allergic reaction to iron, other medicines, foods, dyes, or preservatives  pregnant or trying to get pregnant  breast-feeding How should I use this medicine? This medicine is for injection into a vein. It is given by a health care professional in a hospital or clinic setting. Talk to your pediatrician regarding the use of this medicine in children. Special care may be needed. Overdosage: If you think you have taken too much of this medicine contact a poison control center or emergency room at once. NOTE: This medicine is only for you. Do not share this medicine with others. What if I miss a dose? It is important not to miss your dose. Call your doctor or health care professional if you are unable to keep an appointment. What may interact with this medicine? This medicine may interact with the following medications:  other iron products This list may not describe all possible interactions. Give your health care provider a list of all the medicines, herbs, non-prescription drugs, or dietary supplements you use. Also tell them if you smoke, drink alcohol, or use illegal drugs. Some items may interact with your medicine. What should I watch for while using this medicine? Visit your doctor or healthcare professional regularly. Tell your doctor or healthcare  professional if your symptoms do not start to get better or if they get worse. You may need blood work done while you are taking this medicine. You may need to follow a special diet. Talk to your doctor. Foods that contain iron include: whole grains/cereals, dried fruits, beans, or peas, leafy green vegetables, and organ meats (liver, kidney). What side effects may I notice from receiving this medicine? Side effects that you should report to your doctor or health care professional as soon as possible:  allergic reactions like skin rash, itching or hives, swelling of the face, lips, or tongue  breathing problems  changes in blood pressure  feeling faint or lightheaded, falls  fever or chills  flushing, sweating, or hot feelings  swelling of the ankles or feet Side effects that usually do not require medical attention (report to your doctor or health care professional if they continue or are bothersome):  diarrhea  headache  nausea, vomiting  stomach pain This list may not describe all possible side effects. Call your doctor for medical advice about side effects. You may report side effects to FDA at 1-800-FDA-1088. Where should I keep my medicine? This drug is given in a hospital or clinic and will not be stored at home. NOTE: This sheet is a summary. It may not cover all possible information. If you have questions about this medicine, talk to your doctor, pharmacist, or health care provider.  2021 Elsevier/Gold Standard (2016-09-09 20:21:10)  

## 2020-11-03 ENCOUNTER — Telehealth: Payer: Self-pay | Admitting: Cardiology

## 2020-11-03 ENCOUNTER — Telehealth: Payer: Self-pay | Admitting: Hematology

## 2020-11-03 MED ORDER — METOPROLOL TARTRATE 100 MG PO TABS: ORAL_TABLET | ORAL | 3 refills | Status: DC

## 2020-11-03 NOTE — Telephone Encounter (Signed)
Scheduled follow-up appointments per 3/30 los. Patient is aware. 

## 2020-11-03 NOTE — Addendum Note (Signed)
Addended by: Georgina Pillion on: 11/03/2020 05:24 PM   Modules accepted: Orders

## 2020-11-03 NOTE — Telephone Encounter (Signed)
*  STAT* If patient is at the pharmacy, call can be transferred to refill team.   1. Which medications need to be refilled? (please list name of each medication and dose if known) metoprolol tartrate (LOPRESSOR) 100 MG tablet  2. Which pharmacy/location (including street and city if local pharmacy) is medication to be sent to? [facility] [place], [place]  3. Do they need a 30 day or 90 day supply? 90 day   Patient is out of medication

## 2020-11-04 ENCOUNTER — Other Ambulatory Visit: Payer: Self-pay | Admitting: Cardiology

## 2020-11-07 ENCOUNTER — Inpatient Hospital Stay: Payer: Medicare Other | Attending: Nurse Practitioner

## 2020-11-07 ENCOUNTER — Other Ambulatory Visit: Payer: Self-pay

## 2020-11-07 VITALS — BP 133/47 | HR 89 | Temp 99.3°F | Resp 18

## 2020-11-07 DIAGNOSIS — N920 Excessive and frequent menstruation with regular cycle: Secondary | ICD-10-CM | POA: Diagnosis not present

## 2020-11-07 DIAGNOSIS — D500 Iron deficiency anemia secondary to blood loss (chronic): Secondary | ICD-10-CM | POA: Diagnosis not present

## 2020-11-07 MED ORDER — DIPHENHYDRAMINE HCL 50 MG/ML IJ SOLN
INTRAMUSCULAR | Status: AC
  Filled 2020-11-07: qty 1

## 2020-11-07 MED ORDER — SODIUM CHLORIDE 0.9 % IV SOLN
Freq: Once | INTRAVENOUS | Status: AC
  Filled 2020-11-07: qty 250

## 2020-11-07 MED ORDER — DIPHENHYDRAMINE HCL 50 MG/ML IJ SOLN
12.5000 mg | Freq: Once | INTRAMUSCULAR | Status: AC
Start: 2020-11-07 — End: 2020-11-07
  Administered 2020-11-07: 12.5 mg via INTRAVENOUS

## 2020-11-07 MED ORDER — FAMOTIDINE IN NACL 20-0.9 MG/50ML-% IV SOLN
INTRAVENOUS | Status: AC
  Filled 2020-11-07: qty 50

## 2020-11-07 MED ORDER — FAMOTIDINE IN NACL 20-0.9 MG/50ML-% IV SOLN
Freq: Once | INTRAVENOUS | Status: AC
Start: 2020-11-07 — End: 2020-11-07
  Administered 2020-11-07: 20 mg via INTRAVENOUS

## 2020-11-07 MED ORDER — SODIUM CHLORIDE 0.9 % IV SOLN
510.0000 mg | Freq: Once | INTRAVENOUS | Status: AC
  Administered 2020-11-07: 510 mg via INTRAVENOUS
  Filled 2020-11-07: qty 510

## 2020-11-09 ENCOUNTER — Other Ambulatory Visit: Payer: Self-pay

## 2020-11-09 ENCOUNTER — Telehealth (INDEPENDENT_AMBULATORY_CARE_PROVIDER_SITE_OTHER): Payer: Medicare Other | Admitting: Psychiatry

## 2020-11-09 ENCOUNTER — Encounter (HOSPITAL_COMMUNITY): Payer: Self-pay | Admitting: Psychiatry

## 2020-11-09 DIAGNOSIS — F3162 Bipolar disorder, current episode mixed, moderate: Secondary | ICD-10-CM

## 2020-11-09 DIAGNOSIS — F419 Anxiety disorder, unspecified: Secondary | ICD-10-CM | POA: Diagnosis not present

## 2020-11-09 MED ORDER — LAMOTRIGINE 150 MG PO TABS
150.0000 mg | ORAL_TABLET | Freq: Two times a day (BID) | ORAL | 1 refills | Status: DC
Start: 2020-11-09 — End: 2021-01-15

## 2020-11-09 MED ORDER — CLONAZEPAM 0.5 MG PO TABS: 0.5000 mg | ORAL_TABLET | Freq: Every day | ORAL | 0 refills | Status: DC | PRN

## 2020-11-09 MED ORDER — ARIPIPRAZOLE 15 MG PO TABS: 15.0000 mg | ORAL_TABLET | Freq: Every day | ORAL | 1 refills | Status: DC

## 2020-11-09 MED ORDER — LITHIUM CARBONATE ER 450 MG PO TBCR: 450.0000 mg | EXTENDED_RELEASE_TABLET | Freq: Every day | ORAL | 1 refills | Status: DC

## 2020-11-09 NOTE — Progress Notes (Signed)
Virtual Visit via Telephone Note  I connected with Christine [name] on 11/09/20 at  2:20 PM EDT by telephone and verified that I am speaking with the correct person using two identifiers.  Location: Patient: Home Provider: Home office   I discussed the limitations, risks, security and privacy concerns of performing an evaluation and management service by telephone and the availability of in person appointments. I also discussed with the patient that there may be a patient responsible charge related to this service. The patient expressed understanding and agreed to proceed.   History of Present Illness: Patient is evaluated by phone session.  She had to stop the job because she could not handle it.  She also back to lithium once a day because she could not tolerate and having nausea, vomiting and shakes.  Since she stopped working she is less anxious and less depressed.  Her energy level is also improved.  Recently she had a blood work and her anemia got worse and now she restart iron and getting infusion.  She realized that she cannot work while taking so many medication.  She had a support from her husband.  Now she is back on 1 lithium her tremors are resolved and her attention concentration is improved.  She has no shakes.  She apologized not having lithium level and hemoglobin A1c but promised to do it very soon.  Her irritability, anger, temper is much better since she quit the job.  She is hoping to restart iron help her energy level.  She also had appointment to see [facility] as she had difficulty getting appointment with her previous therapist.  Her sleep is better with CPAP.  She denies any panic attack.   Past Psychiatric History: H/Odepression since age 82. Antidepressant mademanic. SawDr. [name] and tried Risperdal, Seroquel, Abilify, lithium, Prozac, Paxil, Zoloft, Lexapro, Celexa, Effexor and Klonopin.H/Omania and impulsive behavior. We tried [name].H/O multiple  inpatient,PHP andIOP. No h/osuicidal attempt. Last inpatientin November 2017.  Recent Results (from the past 2160 hour(s))  CBC     Status: Abnormal   Collection Time: 09/28/20 12:13 PM  Result Value Ref Range   WBC 10.4 3.4 - 10.8 x10E3/uL   RBC 4.05 3.77 - 5.28 x10E6/uL   Hemoglobin 9.2 (L) 11.1 - 15.9 g/dL   Hematocrit 30.9 (L) 34.0 - 46.6 %   MCV 76 (L) 79 - 97 fL   MCH 22.7 (L) 26.6 - 33.0 pg   MCHC 29.8 (L) 31.5 - 35.7 g/dL   RDW 17.0 (H) 11.7 - 15.4 %   Platelets 350 150 - 450 [id number]  Basic metabolic panel     Status: Abnormal   Collection Time: 09/28/20 12:13 PM  Result Value Ref Range   Glucose 108 (H) 65 - 99 mg/dL   BUN 12 6 - 24 mg/dL   Creatinine, Ser 0.81 0.57 - 1.00 mg/dL    Comment:                **Effective October 02, 2020 [facility] will begin**                  reporting the 2021 CKD-EPI creatinine equation that                  estimates kidney function without a race variable.    GFR calc non Af Amer 89 >59 mL/min/1.73   GFR calc Af Amer 103 >59 mL/min/1.73    Comment: **In accordance with recommendations from the NKF-ASN Task force,**  [facility] is in the process of updating its eGFR calculation to the   2021 CKD-EPI creatinine equation that estimates kidney function   without a race variable.    BUN/Creatinine Ratio 15 9 - 23   Sodium 138 134 - 144 mmol/L   Potassium 4.0 3.5 - 5.2 mmol/L   Chloride 102 96 - 106 mmol/L   CO2 21 20 - 29 mmol/L   Calcium 9.9 8.7 - 10.2 mg/dL  CBC with Differential ([facility] Only)     Status: Abnormal   Collection Time: 11/01/20  9:12 AM  Result Value Ref Range   WBC Count 8.8 4.0 - 10.5 K/uL   RBC 3.94 3.87 - 5.11 MIL/uL   Hemoglobin 8.5 (L) 12.0 - 15.0 g/dL    Comment: Reticulocyte Hemoglobin testing may be clinically indicated, consider ordering this additional test [id number]    HCT 29.1 (L) 36.0 - 46.0 %   MCV 73.9 (L) 80.0 - 100.0 fL   MCH 21.6 (L) 26.0 - 34.0 pg   MCHC 29.2 (L) 30.0 - 36.0 g/dL    RDW 18.7 (H) 11.5 - 15.5 %   Platelet Count 329 150 - 400 K/uL   nRBC 0.5 (H) 0.0 - 0.2 %   Neutrophils Relative % 69 %   Neutro Abs 6.0 1.7 - 7.7 K/uL   Lymphocytes Relative 21 %   Lymphs Abs 1.8 0.7 - 4.0 K/uL   Monocytes Relative 8 %   Monocytes Absolute 0.7 0.1 - 1.0 K/uL   Eosinophils Relative 1 %   Eosinophils Absolute 0.1 0.0 - 0.5 K/uL   Basophils Relative 0 %   Basophils Absolute 0.0 0.0 - 0.1 K/uL   Immature Granulocytes 1 %   Abs Immature Granulocytes 0.09 (H) 0.00 - 0.07 K/uL    Comment: Performed at [facility], [address] [place], [place] [address]  CMP ([place] only)     Status: Abnormal   Collection Time: 11/01/20  9:12 AM  Result Value Ref Range   Sodium 138 135 - 145 mmol/L   Potassium 3.7 3.5 - 5.1 mmol/L   Chloride 108 98 - 111 mmol/L   CO2 22 22 - 32 mmol/L   Glucose, Bld 135 (H) 70 - 99 mg/dL    Comment: Glucose reference range applies only to samples taken after fasting for at least 8 hours.   BUN 13 6 - 20 mg/dL   Creatinine 0.89 0.44 - 1.00 mg/dL   Calcium 8.9 8.9 - 10.3 mg/dL   Total Protein 7.4 6.5 - 8.1 g/dL   Albumin 3.8 3.5 - 5.0 g/dL   AST 8 (L) 15 - 41 U/L   ALT 14 0 - 44 U/L   Alkaline Phosphatase 69 38 - 126 U/L   Total Bilirubin 0.5 0.3 - 1.2 mg/dL   GFR, Estimated >60 >60 mL/min    Comment: (NOTE) Calculated using the CKD-EPI Creatinine Equation (2021)    Anion gap 8 5 - 15    Comment: Performed at [facility], [place] [address], [place], [place] [address]  Iron and TIBC     Status: Abnormal   Collection Time: 11/01/20  9:13 AM  Result Value Ref Range   Iron 28 (L) 41 - 142 ug/dL   TIBC 416 236 - 444 ug/dL   Saturation Ratios 7 (L) 21 - 57 %   UIBC 388 (H) 120 - 384 ug/dL    Comment: Performed at [facility], [address] [place], [place] [address]  Ferritin     Status: Abnormal   Collection Time: 11/01/20  9:13 AM  Result Value Ref Range    Ferritin 5 (L) 11 - 307 ng/mL    Comment: Performed at [facility], [place] [address], [place], [place] [address]    Psychiatric Specialty Exam: Physical Exam  Review of Systems  Weight (!) 347 lb (157.4 kg).There is no height or weight on file to calculate BMI.  General Appearance: NA  Eye Contact:  NA  Speech:  Clear and Coherent and Slow  Volume:  Normal  Mood:  Euthymic  Affect:  NA  Thought Process:  Descriptions of Associations: Intact  Orientation:  Full (Time, Place, and Person)  Thought Content:  Rumination  Suicidal Thoughts:  No  Homicidal Thoughts:  No  Memory:  Immediate;   Good Recent;   Fair Remote;   Fair  Judgement:  Fair  Insight:  Shallow  Psychomotor Activity:  NA  Concentration:  Concentration: Fair and Attention Span: Fair  Recall:  [employer] of Knowledge:  Fair  Language:  Good  Akathisia:  No  Handed:  Right  AIMS (if indicated):     Assets:  Communication Skills Desire for Improvement Housing Social Support  ADL's:  Intact  Cognition:  WNL  Sleep:   Good with CPAP      Assessment and Plan: Bipolar disorder type I.  Anxiety.  Patient is not working and that helps her anxiety and mood swings.  She also had appointment to see [facility].  Reinforced to have blood work especially lithium level and hemoglobin A1c.  Patient like to keep lithium 450 mg 1 a day.  I will also continue Abilify 50 mg daily, Lamictal 300 mg daily and Klonopin 0.5 mg as needed.  She usually takes 3 to 4 tablets a week.  She has no rash, itching tremors or shakes.  Recommended to call us back if she has any question or any concern.  Follow-up in 2 months.  Follow Up Instructions:    I discussed the assessment and treatment plan with the patient. The patient was provided an opportunity to ask questions and all were answered. The patient agreed with the plan and demonstrated an understanding of the instructions.   The patient was advised to call back  or seek an in-person evaluation if the symptoms worsen or if the condition fails to improve as anticipated.  I provided 19 minutes of non-face-to-face time during this encounter.   Kathlee Nations, MD

## 2020-11-29 ENCOUNTER — Inpatient Hospital Stay: Payer: Medicare Other

## 2020-11-29 ENCOUNTER — Other Ambulatory Visit: Payer: Self-pay

## 2020-11-29 VITALS — BP 155/97 | HR 77 | Temp 99.3°F | Resp 20 | Ht 64.0 in | Wt 354.0 lb

## 2020-11-29 DIAGNOSIS — D500 Iron deficiency anemia secondary to blood loss (chronic): Secondary | ICD-10-CM

## 2020-11-29 DIAGNOSIS — N920 Excessive and frequent menstruation with regular cycle: Secondary | ICD-10-CM | POA: Diagnosis not present

## 2020-11-29 LAB — CBC WITH DIFFERENTIAL (CANCER CENTER ONLY)
Abs Immature Granulocytes: 0.08 10*3/uL — ABNORMAL HIGH (ref 0.00–0.07)
Basophils Absolute: 0 10*3/uL (ref 0.0–0.1)
Basophils Relative: 0 %
Eosinophils Absolute: 0.1 10*3/uL (ref 0.0–0.5)
Eosinophils Relative: 1 %
HCT: 36.7 % (ref 36.0–46.0)
Hemoglobin: 11.1 g/dL — ABNORMAL LOW (ref 12.0–15.0)
Immature Granulocytes: 1 %
Lymphocytes Relative: 19 %
Lymphs Abs: 1.8 10*3/uL (ref 0.7–4.0)
MCH: 25.5 pg — ABNORMAL LOW (ref 26.0–34.0)
MCHC: 30.2 g/dL (ref 30.0–36.0)
MCV: 84.2 fL (ref 80.0–100.0)
Monocytes Absolute: 0.9 10*3/uL (ref 0.1–1.0)
Monocytes Relative: 10 %
Neutro Abs: 6.2 10*3/uL (ref 1.7–7.7)
Neutrophils Relative %: 69 %
Platelet Count: 300 10*3/uL (ref 150–400)
RBC: 4.36 MIL/uL (ref 3.87–5.11)
RDW: 26.8 % — ABNORMAL HIGH (ref 11.5–15.5)
WBC Count: 9.1 10*3/uL (ref 4.0–10.5)
nRBC: 0 % (ref 0.0–0.2)

## 2020-11-29 LAB — IRON AND TIBC
Iron: 54 ug/dL (ref 41–142)
Saturation Ratios: 16 % — ABNORMAL LOW (ref 21–57)
TIBC: 345 ug/dL (ref 236–444)
UIBC: 291 ug/dL (ref 120–384)

## 2020-11-29 LAB — FERRITIN: Ferritin: 48 ng/mL (ref 11–307)

## 2020-11-29 MED ORDER — DIPHENHYDRAMINE HCL 50 MG/ML IJ SOLN
12.5000 mg | Freq: Once | INTRAMUSCULAR | Status: AC
  Administered 2020-11-29: 12.5 mg via INTRAVENOUS

## 2020-11-29 MED ORDER — SODIUM CHLORIDE 0.9 % IV SOLN
510.0000 mg | Freq: Once | INTRAVENOUS | Status: AC
  Administered 2020-11-29: 510 mg via INTRAVENOUS
  Filled 2020-11-29: qty 510

## 2020-11-29 MED ORDER — FAMOTIDINE IN NACL 20-0.9 MG/50ML-% IV SOLN
INTRAVENOUS | Status: AC
  Filled 2020-11-29: qty 50

## 2020-11-29 MED ORDER — DIPHENHYDRAMINE HCL 50 MG/ML IJ SOLN
INTRAMUSCULAR | Status: AC
  Filled 2020-11-29: qty 1

## 2020-11-29 MED ORDER — SODIUM CHLORIDE 0.9 % IV SOLN
Freq: Once | INTRAVENOUS | Status: AC
  Filled 2020-11-29: qty 250

## 2020-11-29 MED ORDER — FAMOTIDINE IN NACL 20-0.9 MG/50ML-% IV SOLN
20.0000 mg | Freq: Once | INTRAVENOUS | Status: AC
  Administered 2020-11-29: 20 mg via INTRAVENOUS

## 2020-11-29 NOTE — Progress Notes (Signed)
Patient refused 30 post observation iron infusion. Discharged in stable condition.

## 2020-11-29 NOTE — Patient Instructions (Signed)
Ferumoxytol injection What is this medicine? FERUMOXYTOL is an iron complex. Iron is used to make healthy red blood cells, which carry oxygen and nutrients throughout the body. This medicine is used to treat iron deficiency anemia. This medicine may be used for other purposes; ask your health care provider or pharmacist if you have questions. COMMON BRAND NAME(S): Feraheme What should I tell my health care provider before I take this medicine? They need to know if you have any of these conditions:  anemia not caused by low iron levels  high levels of iron in the blood  magnetic resonance imaging (MRI) test scheduled  an unusual or allergic reaction to iron, other medicines, foods, dyes, or preservatives  pregnant or trying to get pregnant  breast-feeding How should I use this medicine? This medicine is for injection into a vein. It is given by a health care professional in a hospital or clinic setting. Talk to your pediatrician regarding the use of this medicine in children. Special care may be needed. Overdosage: If you think you have taken too much of this medicine contact a poison control center or emergency room at once. NOTE: This medicine is only for you. Do not share this medicine with others. What if I miss a dose? It is important not to miss your dose. Call your doctor or health care professional if you are unable to keep an appointment. What may interact with this medicine? This medicine may interact with the following medications:  other iron products This list may not describe all possible interactions. Give your health care provider a list of all the medicines, herbs, non-prescription drugs, or dietary supplements you use. Also tell them if you smoke, drink alcohol, or use illegal drugs. Some items may interact with your medicine. What should I watch for while using this medicine? Visit your doctor or healthcare professional regularly. Tell your doctor or healthcare  professional if your symptoms do not start to get better or if they get worse. You may need blood work done while you are taking this medicine. You may need to follow a special diet. Talk to your doctor. Foods that contain iron include: whole grains/cereals, dried fruits, beans, or peas, leafy green vegetables, and organ meats (liver, kidney). What side effects may I notice from receiving this medicine? Side effects that you should report to your doctor or health care professional as soon as possible:  allergic reactions like skin rash, itching or hives, swelling of the face, lips, or tongue  breathing problems  changes in blood pressure  feeling faint or lightheaded, falls  fever or chills  flushing, sweating, or hot feelings  swelling of the ankles or feet Side effects that usually do not require medical attention (report to your doctor or health care professional if they continue or are bothersome):  diarrhea  headache  nausea, vomiting  stomach pain This list may not describe all possible side effects. Call your doctor for medical advice about side effects. You may report side effects to FDA at 1-800-FDA-1088. Where should I keep my medicine? This drug is given in a hospital or clinic and will not be stored at home. NOTE: This sheet is a summary. It may not cover all possible information. If you have questions about this medicine, talk to your doctor, pharmacist, or health care provider.  2021 Elsevier/Gold Standard (2016-09-09 20:21:10)  

## 2020-11-30 ENCOUNTER — Ambulatory Visit (INDEPENDENT_AMBULATORY_CARE_PROVIDER_SITE_OTHER): Payer: Medicare Other | Admitting: Psychiatry

## 2020-11-30 DIAGNOSIS — F411 Generalized anxiety disorder: Secondary | ICD-10-CM

## 2020-11-30 DIAGNOSIS — F3162 Bipolar disorder, current episode mixed, moderate: Secondary | ICD-10-CM | POA: Diagnosis not present

## 2020-11-30 NOTE — Progress Notes (Signed)
Virtual Visit via Video Note  I connected with Christine [name] on 12/08/20 at  1:30 PM EDT by a video enabled telemedicine application and verified that I am speaking with the correct person using two identifiers.  Location: Patient: Patient Home Provider: Home Office  I discussed the limitations of evaluation and management by telemedicine and the availability of in person appointments. The patient expressed understanding and agreed to proceed.  History of Present Illness: Bipolar 1 DO and GAD  Treatment Plan Goals: 1) Client would like to become more organized by following a daily structure 5 out of 7 days.  2) Client will leave her home 5 out of 7 days to exercise, run errands or do family activities. 3) Client will engage in daily self-care practices and one significant self-care actity once monthly, such as a massage, hair care, nail care, etc.  Observations/Objective: Counselor met with Client for individual therapy via Webex. Counselor reengaged with Client as there has been a gap in care since Feb 2021. Counselor and Client engaged in the joining stage sharing updates and current need for treatment. Counselor completed CCA with Client to reflect current symptoms, concerns and needs. Counselor determined individual therapy and medication management sufficient level of treatment at this time. Counselor and Client created goals for treatment plan (see above). Counselor and Client discussed frequency of visits and set future appointments. Client engaged well in process and reports motivation to rejoin therapeutic process.    Assessment and Plan: New Counselor will continue to meet with patient to address and reestablish treatment plan goals. Patient will continue to follow recommendations of providers and implement skills learned in session.  Follow Up Instructions: New Counselor will send information for next session via Webex.   The patient was advised to call back or seek an  in-person evaluation if the symptoms worsen or if the condition fails to improve as anticipated.  I provided 55 minutes of non-face-to-face time during this encounter.   Lise Auer, LCSW

## 2020-12-01 ENCOUNTER — Ambulatory Visit: Payer: Medicare Other | Admitting: Adult Health

## 2020-12-06 ENCOUNTER — Inpatient Hospital Stay: Payer: Medicare Other

## 2020-12-06 ENCOUNTER — Telehealth: Payer: Self-pay | Admitting: Nurse Practitioner

## 2020-12-06 NOTE — Telephone Encounter (Signed)
Appts cancelled per 5/4 sch msg. Called pt, no answer. Left msg for pt to call back to r/s.

## 2020-12-08 ENCOUNTER — Telehealth: Payer: Self-pay | Admitting: Hematology

## 2020-12-08 ENCOUNTER — Encounter (HOSPITAL_COMMUNITY): Payer: Self-pay | Admitting: Psychiatry

## 2020-12-08 NOTE — Telephone Encounter (Signed)
Scheduled appt per 5/5 sch msg. Called pt, no answer. Left msg with appt date and time.

## 2020-12-11 ENCOUNTER — Ambulatory Visit (INDEPENDENT_AMBULATORY_CARE_PROVIDER_SITE_OTHER): Payer: Medicare Other | Admitting: Psychiatry

## 2020-12-11 ENCOUNTER — Other Ambulatory Visit: Payer: Self-pay

## 2020-12-11 DIAGNOSIS — F3162 Bipolar disorder, current episode mixed, moderate: Secondary | ICD-10-CM

## 2020-12-11 NOTE — Progress Notes (Signed)
Virtual Visit via Video Note  I connected with Christine [name] on 12/11/20 at  3:30 PM EDT by a video enabled telemedicine application and verified that I am speaking with the correct person using two identifiers.  Location: Patient: Patient Home Provider: Home Office  I discussed the limitations of evaluation and management by telemedicine and the availability of in person appointments. The patient expressed understanding and agreed to proceed.  History of Present Illness: Bipolar 1 DO and GAD  Treatment Plan Goals: 1) Client would like to become more organized by following a daily structure 5 out of 7 days.  2) Client will leave her home 5 out of 7 days to exercise, run errands or do family activities. 3) Client will engage in daily self-care practices and one significant self-care actity once monthly, such as a massage, hair care, nail care, etc.  Observations/Objective: Counselor met withClientfor individual therapy via Webex. Counselor assessed symptoms and functioning, with Client reporting mild depressive symptoms and moderate anxiety. Client denied safety issues, with no SI/HI/AVH/Self-harm experienced since last session. Client reports taking medications as prescribed 7 out of 7 days.   Regarding Goals 1-5, Counselor assessed progress and skills used to accomplish/address goals using MI and CBT interventions. Client reports challenges and difficulties with creating a structured plan and schedule to guide her daily practices and habits. Client shared that she and her husband have discussed a general plan, but will work to put it on paper in the coming days for accountability and predictability. Counselor and Client discussed a variety of schedule models to identify which style would work best for her needs. Client reports leaving the home an average of 4 days per week. Counselor and Client identified barriers and a plan for managing anxiety of being in social settings. Client noted  lack of self-care routine and the need to make appointments to establish routine maintenance. Counselor gave Client homework to make one self-care appointment before next session and to incorporate 5 minute practice daily. Client willing to attempt and report back.    Assessment and Plan: NewCounselor will continue to meet with patient to addressand reestablishtreatment plan goals. Patient will continue to follow recommendations of providers and implement skills learned in session.  Follow Up Instructions: NewCounselor will send information for next session via Webex.   The patient was advised to call back or seek an in-person evaluation if the symptoms worsen or if the condition fails to improve as anticipated.  I provided 55 minutes of non-face-to-face time during this encounter.   Christine Auer, LCSW

## 2020-12-15 ENCOUNTER — Inpatient Hospital Stay: Payer: Medicare Other | Attending: Nurse Practitioner

## 2020-12-15 DIAGNOSIS — Z79899 Other long term (current) drug therapy: Secondary | ICD-10-CM | POA: Insufficient documentation

## 2020-12-15 DIAGNOSIS — I10 Essential (primary) hypertension: Secondary | ICD-10-CM | POA: Insufficient documentation

## 2020-12-15 DIAGNOSIS — M1990 Unspecified osteoarthritis, unspecified site: Secondary | ICD-10-CM | POA: Insufficient documentation

## 2020-12-15 DIAGNOSIS — N920 Excessive and frequent menstruation with regular cycle: Secondary | ICD-10-CM | POA: Insufficient documentation

## 2020-12-15 DIAGNOSIS — D500 Iron deficiency anemia secondary to blood loss (chronic): Secondary | ICD-10-CM | POA: Insufficient documentation

## 2020-12-18 ENCOUNTER — Encounter (HOSPITAL_COMMUNITY): Payer: Self-pay | Admitting: Psychiatry

## 2020-12-25 ENCOUNTER — Other Ambulatory Visit: Payer: Self-pay

## 2020-12-25 ENCOUNTER — Ambulatory Visit (HOSPITAL_COMMUNITY): Payer: Medicare Other | Admitting: Psychiatry

## 2020-12-26 NOTE — Progress Notes (Signed)
[place]   Telephone:([phone]) [phone] [phone]) [phone]   Clinic Follow up Note   Patient Care Team: Scifres, Durel Salts as PCP - General (Physician Assistant) Christine Man, MD as PCP - Cardiology (Cardiology) 12/27/2020  CHIEF COMPLAINT: Follow-up IDA  CURRENT THERAPY: IV Feraheme as needed, last given March and April 2022  INTERVAL HISTORY: Christine [name] returns for follow-up and iron infusion as scheduled.  She was last seen 11/01/2020, received Feraheme last on 11/07/2020 and 11/29/2020 for ferritin of 5.  Her energy, dyspnea, and lightheadedness/dizziness improved with IV iron. She was sick a few weeks ago with a non-covid febrile illness but has recovered.  She continues to have heavy menses q. 25-26 days due to begin her next cycle on 5/2. Has not seen ob/gyn to discuss interventions.  Her mental health remains an "ongoing struggle" worse lately due to difficulty sleeping which affects her anxiety.  She sees her psychiatrist next week, she denies any urgent issues.  Denies SI.  All other systems were reviewed with the patient and are negative.  MEDICAL HISTORY:  Past Medical History:  Diagnosis Date  . Anemia    Presumably from menorrhagia/menomenorrhagia; has had both blood and iron transfusions  . Anxiety   . Back pain   . Bipolar 1 disorder ([place])    With depression  . Depression   . Essential hypertension   . Fibromyalgia   . Headache   . History of blood transfusion 08/2017   [facility]  . History of hiatal hernia   . Infertility, female   . Lactose intolerance   . Lower extremity edema   . Migraines   . Morbid obesity ([place])   . Osteoarthritis    Hands, ankles; HLA-B27 positive  . PCOS (polycystic ovarian syndrome)   . Seasonal allergies    With recurrent allergic rhinitis  . Uterine leiomyoma   . Vitamin D deficiency     SURGICAL HISTORY: Past Surgical History:  Procedure Laterality Date  . CARDIAC CT ANGIOGRAM  03/2019    Ca Score 0. Normal  Coronary origin R dominant. No evidence of CAD. ? Liver nodules - poorly visualized (consider MRI w & w/o Gad contrast)  . CERVICAL POLYPECTOMY  01/15/2018   Procedure: CERVICAL POLYPECTOMY;  Surgeon: Arvella Nigh, MD;  Location: [facility];  Service: Gynecology;;  . COLONOSCOPY  2008   Normal  . DIAGNOSTIC LAPAROSCOPY  08/1999   dermoid cyst, [facility]  . DILATION AND CURETTAGE OF UTERUS  05/2004   [facility]  . DILATION AND CURETTAGE OF UTERUS  04/2015  . HYSTEROSCOPY WITH D & C  07/22/2011   Procedure: DILATATION AND CURETTAGE /HYSTEROSCOPY;  Surgeon: Cyril Mourning, MD;  Location: [facility];  Service: Gynecology;;  . HYSTEROSCOPY WITH D & C N/A 01/15/2018   Procedure: DILATATION AND CURETTAGE [place] WITH MYOSURE;  Surgeon: Arvella Nigh, MD;  Location: [facility];  Service: Gynecology;  Laterality: N/A;  . 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 TRANSTHORACIC ECHOCARDIOGRAM  08/2017    EF 65 to 70% with vigorous wall motion.  Suggestion of high cardiac output (possibly related to anemia thyrotoxicosis, Pregnancy, sepsis etc.) -- was in setting of symptomatic anemia - Hgb 5.7    I have reviewed the social history and family history with the patient and they are unchanged from previous note.  ALLERGIES:  is allergic to penicillins.  MEDICATIONS:  Current Outpatient Medications  Medication Sig Dispense Refill  . acetaminophen (TYLENOL) 325 MG tablet Take  650 mg by mouth every 6 (six) hours as needed for mild pain or headache.    . ARIPiprazole (ABILIFY) 15 MG tablet Take 1 tablet (15 mg total) by mouth daily. 30 tablet 1  . clonazePAM (KLONOPIN) 0.5 MG tablet Take 1 tablet (0.5 mg total) by mouth daily as needed for anxiety. 30 tablet 0  . fluticasone (FLONASE) 50 MCG/ACT nasal spray 1 spray in each nostril    . lamoTRIgine (LAMICTAL) 150 MG tablet Take 1 tablet (150 mg total) by mouth 2 (two) times daily. For mood 60 tablet 1  . lithium carbonate (ESKALITH) 450 MG CR tablet Take 1 tablet (450 mg  total) by mouth daily. 30 tablet 1  . metFORMIN (GLUCOPHAGE) 500 MG tablet Take 1 tablet (500 mg total) by mouth 2 (two) times daily with a meal. 60 tablet 0  . metoprolol succinate (TOPROL-XL) 100 MG 24 hr tablet TAKE 1 TABLET BY MOUTH DAILY WITH OR IMMEDIATELY FOLLOWING A MEAL 90 tablet 3  . amLODipine (NORVASC) 5 MG tablet Take 1 tablet (5 mg total) by mouth daily. 30 tablet 0  . hydrochlorothiazide (MICROZIDE) 12.5 MG capsule Take 1 capsule (12.5 mg total) by mouth daily. 30 capsule 0   No current facility-administered medications for this visit.    PHYSICAL EXAMINATION:  Vitals:   12/27/20 0829  BP: (!) 154/79  Pulse: 96  Resp: 20  Temp: 99.7 F (37.6 C)  SpO2: 99%   Filed Weights   12/27/20 0829  Weight: (!) 349 lb 11.2 oz (158.6 kg)    GENERAL:alert, no distress and comfortable SKIN: no rash  EYES: sclera clear LUNGS: clear with normal breathing effort HEART: regular rate & rhythm  NEURO: alert & oriented x 3 with fluent speech, no focal motor/sensory deficits  LABORATORY DATA:  I have reviewed the data as listed CBC Latest Ref Rng & Units 12/27/2020 11/29/2020 11/01/2020  WBC 4.0 - 10.5 K/uL 8.1 9.1 8.8  Hemoglobin 12.0 - 15.0 g/dL 11.7(L) 11.1(L) 8.5(L)  Hematocrit 36.0 - 46.0 % 36.9 36.7 29.1(L)  Platelets 150 - 400 K/uL 283 300 329    RADIOGRAPHIC STUDIES: I have personally reviewed the radiological images as listed and agreed with the findings in the report. No results found.   ASSESSMENT & PLAN: Christine [name] is a pleasant 45yo AAF with history of uterine fibroids, polyps, PCOS, obesity, HTN, and arthritis found to have severe iron deficiency anemia secondary to chronic blood loss   1. 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 menorrhagia -She required frequent IV Feraheme support in the past, from January- June 2019, then she lost to f/u -when she re-established in 02/2019, shepresented with symptomatic anemia with Hg 7.8, labs  revealedsevere iron deficiency with ferritin <4, serum iron 18, 4% transferrin sat -She received monthly IV Feraheme from 02/2019 - 04/2019, she responded welland anemia resolved until she became symptomatic again with exertional dyspnea, fatigue, lightheadedness -11/01/2020 labs show Hg 8.5, serum iron 28, TIBC 416, 7% saturation, ferritin 5 consistent with severe IDA likely secondary to menorrhagia blood loss (monthly menses heavy x5 days) -She became intolerant to oral iron and we resumed IV Feraheme weekly x2 as needed starting 11/01/2020, last dose 11/29/2020 -5/25 labs show Hg 11.7, serum iron 63, TIBC 330, ferritin 48.  She tolerates and response to IV iron very well  2.HTN -on amlodipine, metoprolol, HCTZ  -Followed by Dr. Ellyn Hack   3.Overall health and wellness -Previously under care of weight loss wellness center, PCP, cardiology, and Gyn -  She has known menorrhagia and uterine fibroids, I continue to recommend OB/GYN follow-up to discuss ways to control menstrual bleeding -Previously encouraged her to complete age-appropriate cancer screenings this year  4. Bipolar -On aripiprazole, clonazepam, lamotrigine, lithium -she is under mental health care and is getting more consistent with therapy lately -Due to limited flexibility with work and stress, she is on disability and not working   Disposition: Christine [name] appears stable.  She has received 3 doses IV Feraheme in the last 2 months.  Her symptomatic IDA has improved.  Hg 11.7, serum iron 63, TIBC 330, ferritin 48; she responds well to IV Feraheme.  Given that her ferritin is still < 50 and she is about to have another menstrual period, I recommend IV Feraheme today and next week as previously scheduled.  She will return for lab in 2 in 4 months, with IV Feraheme if needed (Ferritin <50 and/or symptomatic of anemia) follow-up in 4 months.  She knows to call sooner if she has recurrent/worsening symptoms of anemia.  Continue  f/up with [facility] provider.   All questions were answered. The patient knows to call the clinic with any problems, questions or concerns. No barriers to learning were detected.     Alla Feeling, NP 12/27/20

## 2020-12-27 ENCOUNTER — Encounter: Payer: Self-pay | Admitting: Nurse Practitioner

## 2020-12-27 ENCOUNTER — Inpatient Hospital Stay: Payer: Medicare Other

## 2020-12-27 ENCOUNTER — Inpatient Hospital Stay (HOSPITAL_BASED_OUTPATIENT_CLINIC_OR_DEPARTMENT_OTHER): Payer: Medicare Other | Admitting: Nurse Practitioner

## 2020-12-27 ENCOUNTER — Other Ambulatory Visit: Payer: Self-pay

## 2020-12-27 ENCOUNTER — Telehealth: Payer: Self-pay | Admitting: Nurse Practitioner

## 2020-12-27 VITALS — BP 114/71 | HR 75 | Temp 98.6°F | Resp 18

## 2020-12-27 VITALS — BP 154/79 | HR 96 | Temp 99.7°F | Resp 20 | Ht 64.0 in | Wt 349.7 lb

## 2020-12-27 DIAGNOSIS — D500 Iron deficiency anemia secondary to blood loss (chronic): Secondary | ICD-10-CM

## 2020-12-27 DIAGNOSIS — I10 Essential (primary) hypertension: Secondary | ICD-10-CM | POA: Diagnosis not present

## 2020-12-27 DIAGNOSIS — Z79899 Other long term (current) drug therapy: Secondary | ICD-10-CM | POA: Diagnosis not present

## 2020-12-27 DIAGNOSIS — N920 Excessive and frequent menstruation with regular cycle: Secondary | ICD-10-CM | POA: Diagnosis not present

## 2020-12-27 DIAGNOSIS — M1990 Unspecified osteoarthritis, unspecified site: Secondary | ICD-10-CM | POA: Diagnosis not present

## 2020-12-27 LAB — IRON AND TIBC
Iron: 63 ug/dL (ref 41–142)
Saturation Ratios: 19 % — ABNORMAL LOW (ref 21–57)
TIBC: 330 ug/dL (ref 236–444)
UIBC: 267 ug/dL (ref 120–384)

## 2020-12-27 LAB — CBC WITH DIFFERENTIAL (CANCER CENTER ONLY)
Abs Immature Granulocytes: 0.05 10*3/uL (ref 0.00–0.07)
Basophils Absolute: 0 10*3/uL (ref 0.0–0.1)
Basophils Relative: 0 %
Eosinophils Absolute: 0.1 10*3/uL (ref 0.0–0.5)
Eosinophils Relative: 1 %
HCT: 36.9 % (ref 36.0–46.0)
Hemoglobin: 11.7 g/dL — ABNORMAL LOW (ref 12.0–15.0)
Immature Granulocytes: 1 %
Lymphocytes Relative: 18 %
Lymphs Abs: 1.5 10*3/uL (ref 0.7–4.0)
MCH: 27.8 pg (ref 26.0–34.0)
MCHC: 31.7 g/dL (ref 30.0–36.0)
MCV: 87.6 fL (ref 80.0–100.0)
Monocytes Absolute: 0.6 10*3/uL (ref 0.1–1.0)
Monocytes Relative: 7 %
Neutro Abs: 6 10*3/uL (ref 1.7–7.7)
Neutrophils Relative %: 73 %
Platelet Count: 283 10*3/uL (ref 150–400)
RBC: 4.21 MIL/uL (ref 3.87–5.11)
RDW: 22.6 % — ABNORMAL HIGH (ref 11.5–15.5)
WBC Count: 8.1 10*3/uL (ref 4.0–10.5)
nRBC: 0 % (ref 0.0–0.2)

## 2020-12-27 LAB — FERRITIN: Ferritin: 48 ng/mL (ref 11–307)

## 2020-12-27 MED ORDER — FAMOTIDINE IN NACL 20-0.9 MG/50ML-% IV SOLN: 20.0000 mg | Freq: Two times a day (BID) | INTRAVENOUS | Status: DC

## 2020-12-27 MED ORDER — DIPHENHYDRAMINE HCL 50 MG/ML IJ SOLN
INTRAMUSCULAR | Status: AC
  Filled 2020-12-27: qty 1

## 2020-12-27 MED ORDER — FAMOTIDINE 20 MG IN NS 100 ML IVPB
20.0000 mg | Freq: Once | INTRAVENOUS | Status: AC
  Administered 2020-12-27: 20 mg via INTRAVENOUS

## 2020-12-27 MED ORDER — FAMOTIDINE 20 MG IN NS 100 ML IVPB
INTRAVENOUS | Status: AC
  Filled 2020-12-27: qty 100

## 2020-12-27 MED ORDER — SODIUM CHLORIDE 0.9 % IV SOLN
510.0000 mg | Freq: Once | INTRAVENOUS | Status: AC
  Administered 2020-12-27: 510 mg via INTRAVENOUS
  Filled 2020-12-27: qty 510

## 2020-12-27 MED ORDER — SODIUM CHLORIDE 0.9 % IV SOLN
Freq: Once | INTRAVENOUS | Status: AC
  Filled 2020-12-27: qty 250

## 2020-12-27 MED ORDER — DIPHENHYDRAMINE HCL 50 MG/ML IJ SOLN
12.5000 mg | Freq: Once | INTRAMUSCULAR | Status: AC
Start: 2020-12-27 — End: 2020-12-27
  Administered 2020-12-27: 12.5 mg via INTRAVENOUS

## 2020-12-27 NOTE — Telephone Encounter (Signed)
Scheduled follow-up appointments per 5/25 los. Patient is aware.

## 2020-12-27 NOTE — Patient Instructions (Signed)
Ferumoxytol injection What is this medicine? FERUMOXYTOL is an iron complex. Iron is used to make healthy red blood cells, which carry oxygen and nutrients throughout the body. This medicine is used to treat iron deficiency anemia. This medicine may be used for other purposes; ask your health care provider or pharmacist if you have questions. COMMON BRAND NAME(S): Feraheme What should I tell my health care provider before I take this medicine? They need to know if you have any of these conditions:  anemia not caused by low iron levels  high levels of iron in the blood  magnetic resonance imaging (MRI) test scheduled  an unusual or allergic reaction to iron, other medicines, foods, dyes, or preservatives  pregnant or trying to get pregnant  breast-feeding How should I use this medicine? This medicine is for injection into a vein. It is given by a health care professional in a hospital or clinic setting. Talk to your pediatrician regarding the use of this medicine in children. Special care may be needed. Overdosage: If you think you have taken too much of this medicine contact a poison control center or emergency room at once. NOTE: This medicine is only for you. Do not share this medicine with others. What if I miss a dose? It is important not to miss your dose. Call your doctor or health care professional if you are unable to keep an appointment. What may interact with this medicine? This medicine may interact with the following medications:  other iron products This list may not describe all possible interactions. Give your health care provider a list of all the medicines, herbs, non-prescription drugs, or dietary supplements you use. Also tell them if you smoke, drink alcohol, or use illegal drugs. Some items may interact with your medicine. What should I watch for while using this medicine? Visit your doctor or healthcare professional regularly. Tell your doctor or healthcare  professional if your symptoms do not start to get better or if they get worse. You may need blood work done while you are taking this medicine. You may need to follow a special diet. Talk to your doctor. Foods that contain iron include: whole grains/cereals, dried fruits, beans, or peas, leafy green vegetables, and organ meats (liver, kidney). What side effects may I notice from receiving this medicine? Side effects that you should report to your doctor or health care professional as soon as possible:  allergic reactions like skin rash, itching or hives, swelling of the face, lips, or tongue  breathing problems  changes in blood pressure  feeling faint or lightheaded, falls  fever or chills  flushing, sweating, or hot feelings  swelling of the ankles or feet Side effects that usually do not require medical attention (report to your doctor or health care professional if they continue or are bothersome):  diarrhea  headache  nausea, vomiting  stomach pain This list may not describe all possible side effects. Call your doctor for medical advice about side effects. You may report side effects to FDA at 1-800-FDA-1088. Where should I keep my medicine? This drug is given in a hospital or clinic and will not be stored at home. NOTE: This sheet is a summary. It may not cover all possible information. If you have questions about this medicine, talk to your doctor, pharmacist, or health care provider.  2021 Elsevier/Gold Standard (2016-09-09 20:21:10)  

## 2021-01-02 ENCOUNTER — Telehealth: Payer: Self-pay | Admitting: Nurse Practitioner

## 2021-01-02 NOTE — Telephone Encounter (Signed)
R/s appts per 5/31 sch msg. Pt wanted to stay at [facility] so r/s to next available day. Pt is aware.

## 2021-01-03 ENCOUNTER — Inpatient Hospital Stay: Payer: Medicare Other

## 2021-01-03 DIAGNOSIS — R600 Localized edema: Secondary | ICD-10-CM | POA: Diagnosis not present

## 2021-01-04 NOTE — Progress Notes (Signed)
Cardiology Office Note   Date:  01/05/2021   ID:  Laurel Harnden, DOB Aug 27, 1975, MRN [id number]  PCP:  Maude Leriche, PA-C  Cardiologist:  Dr. Ellyn Hack  CC: Follow Up    History of Present Illness: Christine [name] is a 45 y.o. female who presents for ongoing assessment and management of hypertension, pulmonary hypertension with other history to include sleep apnea on CPAP, bipolar disorder, Type 2 diabetes, and anemia for which she gets iron infusions..  She also has chronic dyspnea on exertion for which she was seen last in the office on 09/28/2020 by Coletta Memos, NP.  She also endorsed sharp chest discomfort lasting about 20 minutes to an hour along with heaviness in her chest.  There was no adjustment in medications or planned testing at that time.  She comes today with no new complaints.  She is trying to lose weight, she has an exercise bike which she rides for a few minutes every other day.  She has some discomfort in her chest but not severe or sustained.  She is medically compliant.  She is requesting refill on her diuretic.  Past Medical History:  Diagnosis Date  . Anemia    Presumably from menorrhagia/menomenorrhagia; has had both blood and iron transfusions  . Anxiety   . Back pain   . Bipolar 1 disorder ([place])    With depression  . Depression   . Essential hypertension   . Fibromyalgia   . Headache   . History of blood transfusion 08/2017   [facility]  . History of hiatal hernia   . Infertility, female   . Lactose intolerance   . Lower extremity edema   . Migraines   . Morbid obesity ([place])   . Osteoarthritis    Hands, ankles; HLA-B27 positive  . PCOS (polycystic ovarian syndrome)   . Seasonal allergies    With recurrent allergic rhinitis  . Uterine leiomyoma   . Vitamin D deficiency     Past Surgical History:  Procedure Laterality Date  . CARDIAC CT ANGIOGRAM  03/2019    Ca Score 0. Normal Coronary origin R dominant. No evidence of CAD. ? Liver nodules - poorly  visualized (consider MRI w & w/o Gad contrast)  . CERVICAL POLYPECTOMY  01/15/2018   Procedure: CERVICAL POLYPECTOMY;  Surgeon: Arvella Nigh, MD;  Location: [facility];  Service: Gynecology;;  . COLONOSCOPY  2008   Normal  . DIAGNOSTIC LAPAROSCOPY  08/1999   dermoid cyst, [facility]  . DILATION AND CURETTAGE OF UTERUS  05/2004   [facility]  . DILATION AND CURETTAGE OF UTERUS  04/2015  . HYSTEROSCOPY WITH D & C  07/22/2011   Procedure: DILATATION AND CURETTAGE /HYSTEROSCOPY;  Surgeon: Cyril Mourning, MD;  Location: [facility];  Service: Gynecology;;  . HYSTEROSCOPY WITH D & C N/A 01/15/2018   Procedure: DILATATION AND CURETTAGE [place] WITH MYOSURE;  Surgeon: Arvella Nigh, MD;  Location: [facility];  Service: Gynecology;  Laterality: N/A;  . 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 TRANSTHORACIC ECHOCARDIOGRAM  08/2017    EF 65 to 70% with vigorous wall motion.  Suggestion of high cardiac output (possibly related to anemia thyrotoxicosis, Pregnancy, sepsis etc.) -- was in setting of symptomatic anemia - Hgb 5.7     Current Outpatient Medications  Medication Sig Dispense Refill  . acetaminophen (TYLENOL) 325 MG tablet Take 650 mg by mouth every 6 (six) hours as needed for mild pain or headache.    . ARIPiprazole (ABILIFY) 15 MG tablet  Take 1 tablet (15 mg total) by mouth daily. 30 tablet 1  . chlorthalidone (HYGROTON) 25 MG tablet Take 0.5 tablets (12.5 mg total) by mouth daily. 60 tablet 2  . clonazePAM (KLONOPIN) 0.5 MG tablet Take 1 tablet (0.5 mg total) by mouth daily as needed for anxiety. 30 tablet 0  . fluticasone (FLONASE) 50 MCG/ACT nasal spray 1 spray in each nostril    . lamoTRIgine (LAMICTAL) 150 MG tablet Take 1 tablet (150 mg total) by mouth 2 (two) times daily. For mood 60 tablet 1  . lithium carbonate (ESKALITH) 450 MG CR tablet Take 1 tablet (450 mg total) by mouth daily. 30 tablet 1  . metFORMIN (GLUCOPHAGE) 500 MG tablet Take 1 tablet (500 mg total) by mouth 2 (two) times daily with a  meal. 60 tablet 0  . metoprolol succinate (TOPROL-XL) 100 MG 24 hr tablet TAKE 1 TABLET BY MOUTH DAILY WITH OR IMMEDIATELY FOLLOWING A MEAL 90 tablet 3  . amLODipine (NORVASC) 5 MG tablet Take 1 tablet (5 mg total) by mouth daily. 30 tablet 0   No current facility-administered medications for this visit.    Allergies:   Penicillins    Social History:  The patient  reports that she has never smoked. She has never used smokeless tobacco. She reports current alcohol use. She reports that she does not use drugs.   Family History:  The patient's family history includes Anxiety disorder in her mother; Cancer in her father and mother; Cancer (age of onset: 58) in her brother; Depression in her mother; Diabetes in her father; Heart disease in her father; High blood pressure in her brother, father, and mother; Hyperlipidemia in her father and mother; Hypertension in her father and mother; Kidney disease in her father; Obesity in her father; Sleep apnea in her father; Stroke in her father.    ROS: All other systems are reviewed and negative. Unless otherwise mentioned in H&P    PHYSICAL EXAM: VS:  BP (!) 150/84   Pulse (!) 105   Ht $R'5\' 5"'un$  (1.651 m)   Wt (!) 356 lb (161.5 kg)   SpO2 96%   BMI 59.24 kg/m  , BMI Body mass index is 59.24 kg/m. GEN: Well nourished, well developed, in no acute distress HEENT: normal Neck: no JVD, carotid bruits, or masses Cardiac: RRR; 1/6 systolic murmurs, rubs, or gallops,no edema  Respiratory:  Clear to auscultation bilaterally, normal work of breathing GI: soft, nontender, nondistended, + BS MS: no deformity or atrophy Skin: warm and dry, no rash Neuro:  Strength and sensation are intact Psych: euthymic mood, full affect   EKG: Not completed this office visit  Recent Labs: 11/01/2020: ALT 14; BUN 13; Creatinine 0.89; Potassium 3.7; Sodium 138 12/27/2020: Hemoglobin 11.7; Platelet Count 283    Lipid Panel    Component Value Date/Time   CHOL 183  10/07/2018 1220   TRIG 70 10/07/2018 1220   HDL 38 (L) 10/07/2018 1220   LDLCALC 131 (H) 10/07/2018 1220      Wt Readings from Last 3 Encounters:  01/05/21 (!) 356 lb (161.5 kg)  12/27/20 (!) 349 lb 11.2 oz (158.6 kg)  11/29/20 (!) 354 lb (160.6 kg)      Other studies Reviewed: Echocardiogram Aug 22, 2019 1. Left ventricular ejection fraction, by visual estimation, is 60 to  65%. The left ventricle has normal function. There is no left ventricular  hypertrophy.  2. The left ventricle has no regional wall motion abnormalities.  3. Global right ventricle has normal  systolic function.The right  ventricular size is normal. No increase in right ventricular wall  thickness.  4. Left atrial size was normal.  5. Right atrial size was normal.  6. The mitral valve is normal in structure. No evidence of mitral valve  regurgitation.  7. The tricuspid valve is normal in structure.  8. The aortic valve is normal in structure. Aortic valve regurgitation is  not visualized.  9. The pulmonic valve was normal in structure. Pulmonic valve  regurgitation is not visualized.  10. The atrial septum is grossly normal.    ASSESSMENT AND PLAN:  1.  Noncardiac chest pain: The patient experiences some discomfort in her chest after exercising which is fleeting.  This may be related to her deep breathing during exercise and chest soreness as a result.  She is to continue her exercise regimen as this is healthy for her to do to improve weight loss progression.  No plan cardiac testing at this time  2.  Hypertension: Blood pressure elevated on this office visit.  She states that is because she did not take her Klonopin prior to coming today.  Normally she states it is little lower.  She request with fills on HCTZ.  I will change her to chlorthalidone 12.5 mg daily as this is more effective for blood pressure control and fluid retention.  Labs are completed by her hematologist which is treating her for  iron deficiency anemia.  3.  Morbid obesity: I have encouraged her to continue her exercise and weight reduction program.  I think some of her chest soreness may related to deep breathing concerning exercise.  I think she is on the right track with exercise and would like to to see her progress in that amount of time she is exercising to increase stamina and weight loss and for overall health. Current me. dicines are reviewed at length with the patient today.  I have spent 25 min's  dedicated to the care of this patient on the date of this encounter to include pre-visit review of records, assessment, management and diagnostic testing,with shared decision making.  4. Type II diabetes: ManageI faxed that to her daughter by 20 to have been in case she calls and sent a number 01/05/2021 12:58 PM    d by PCP  Labs/ tests ordered today include: None  Phill Myron. [place], ANP, AACC   Notice: This dictation was prepared with Dragon dictation along with smaller phrase technology. Any transcriptional errors that result from this process are unintentional and may not be corrected upon review.

## 2021-01-05 ENCOUNTER — Ambulatory Visit (INDEPENDENT_AMBULATORY_CARE_PROVIDER_SITE_OTHER): Payer: Medicare Other | Admitting: Adult Health

## 2021-01-05 ENCOUNTER — Other Ambulatory Visit: Payer: Self-pay

## 2021-01-05 ENCOUNTER — Encounter: Payer: Self-pay | Admitting: Adult Health

## 2021-01-05 VITALS — BP 150/84 | HR 105 | Ht 65.0 in | Wt 356.0 lb

## 2021-01-05 DIAGNOSIS — E662 Morbid (severe) obesity with alveolar hypoventilation: Secondary | ICD-10-CM | POA: Diagnosis not present

## 2021-01-05 DIAGNOSIS — I10 Essential (primary) hypertension: Secondary | ICD-10-CM

## 2021-01-05 DIAGNOSIS — E0800 Diabetes mellitus due to underlying condition with hyperosmolarity without nonketotic hyperglycemic-hyperosmolar coma (NKHHC): Secondary | ICD-10-CM | POA: Diagnosis not present

## 2021-01-05 DIAGNOSIS — R0789 Other chest pain: Secondary | ICD-10-CM

## 2021-01-05 DIAGNOSIS — R000 Tachycardia, unspecified: Secondary | ICD-10-CM

## 2021-01-05 DIAGNOSIS — Z6841 Body Mass Index (BMI) 40.0 and over, adult: Secondary | ICD-10-CM | POA: Diagnosis not present

## 2021-01-05 DIAGNOSIS — E66813 Obesity, class 3: Secondary | ICD-10-CM

## 2021-01-05 MED ORDER — CHLORTHALIDONE 25 MG PO TABS: 12.5000 mg | ORAL_TABLET | Freq: Every day | ORAL | 2 refills | Status: DC

## 2021-01-05 NOTE — Patient Instructions (Signed)
Medication Instructions:  Start Chlorthalidone 12.5 mg ( Take 1/2 of 25 mg Tablet Daily) *If you need a refill on your cardiac medications before your next appointment, please call your pharmacy*   Lab Work: No Labs If you have labs (blood work) drawn today and your tests are completely normal, you will receive your results only by: [name] MyChart Message (if you have MyChart) OR . A paper copy in the mail If you have any lab test that is abnormal or we need to change your treatment, we will call you to review the results.   Testing/Procedures: No Testing   Follow-Up: At [facility], you and your health needs are our priority.  As part of our continuing mission to provide you with exceptional heart care, we have created designated Provider Care Teams.  These Care Teams include your primary Cardiologist (physician) and Advanced Practice Providers (APPs -  Physician Assistants and Nurse Practitioners) who all work together to provide you with the care you need, when you need it.    Your next appointment:   6 month(s)  The format for your next appointment:   In Person  Provider:   Glenetta Hew, MD

## 2021-01-09 ENCOUNTER — Telehealth (HOSPITAL_COMMUNITY): Payer: Medicare Other | Admitting: Psychiatry

## 2021-01-10 ENCOUNTER — Telehealth (HOSPITAL_COMMUNITY): Payer: Medicare Other | Admitting: Psychiatry

## 2021-01-15 ENCOUNTER — Other Ambulatory Visit: Payer: Self-pay

## 2021-01-15 ENCOUNTER — Other Ambulatory Visit (HOSPITAL_COMMUNITY): Payer: Self-pay | Admitting: *Deleted

## 2021-01-15 ENCOUNTER — Telehealth (INDEPENDENT_AMBULATORY_CARE_PROVIDER_SITE_OTHER): Payer: Medicare Other | Admitting: Psychiatry

## 2021-01-15 ENCOUNTER — Encounter (HOSPITAL_COMMUNITY): Payer: Self-pay | Admitting: Psychiatry

## 2021-01-15 ENCOUNTER — Telehealth (HOSPITAL_COMMUNITY): Payer: Self-pay | Admitting: *Deleted

## 2021-01-15 DIAGNOSIS — F419 Anxiety disorder, unspecified: Secondary | ICD-10-CM

## 2021-01-15 DIAGNOSIS — F3162 Bipolar disorder, current episode mixed, moderate: Secondary | ICD-10-CM

## 2021-01-15 DIAGNOSIS — Z79899 Other long term (current) drug therapy: Secondary | ICD-10-CM

## 2021-01-15 MED ORDER — LAMOTRIGINE 150 MG PO TABS: 150.0000 mg | ORAL_TABLET | Freq: Two times a day (BID) | ORAL | 1 refills | Status: DC

## 2021-01-15 MED ORDER — ARIPIPRAZOLE 15 MG PO TABS: 15.0000 mg | ORAL_TABLET | Freq: Every day | ORAL | 1 refills | Status: DC

## 2021-01-15 MED ORDER — CLONAZEPAM 0.5 MG PO TABS: 0.5000 mg | ORAL_TABLET | Freq: Every day | ORAL | 0 refills | Status: DC | PRN

## 2021-01-15 MED ORDER — LITHIUM CARBONATE ER 300 MG PO TBCR: 300.0000 mg | EXTENDED_RELEASE_TABLET | Freq: Two times a day (BID) | ORAL | 1 refills | Status: DC

## 2021-01-15 NOTE — Telephone Encounter (Signed)
Writer advised pt that lab orsers have been sent to [facility]. [place] understanding.

## 2021-01-15 NOTE — Progress Notes (Signed)
Virtual Visit via Telephone Note  I connected with Christine [name] on 01/15/21 at  2:20 PM EDT by telephone and verified that I am speaking with the correct person using two identifiers.  Location: Patient: Home Provider: Home Office   I discussed the limitations, risks, security and privacy concerns of performing an evaluation and management service by telephone and the availability of in person appointments. I also discussed with the patient that there may be a patient responsible charge related to this service. The patient expressed understanding and agreed to proceed.   History of Present Illness: Patient is evaluated by phone session.  She again forgot to do the blood work and apologized but like to do it in few days.  She feels 3 weeks ago she was very sad and depressed and tried to cut superficially but decided to stop and she is feeling better now.  She admitted not consistent with therapy with [facility].  She is not working and trying to help her husband.  She endorsed in past few days increased anxiety but denies any panic attack, crying spells or any feeling of hopelessness or worthlessness.  She is using CPAP which helps her sleep most of the nights.  She is taking Klonopin as needed.  She has no tremors, shakes or any EPS.  We had tried higher dose of lithium but she started to have tremors and now she is back on 1 lithium 450 mg in the morning.  She has no more temper and anger but endorsed chronic frustration.   Past Psychiatric History: H/O depression since age 24.  Antidepressant made manic.  Saw Dr. [name] and tried Risperdal, Seroquel, Abilify, lithium, Prozac, Paxil, Zoloft, Lexapro, Celexa, Effexor and Klonopin.  H/O mania and impulsive behavior.  We tried [name]. H/O multiple inpatient, PHP and IOP. No h/o suicidal attempt.  Last inpatient in November 2017.     Psychiatric Specialty Exam: Physical Exam  Review of Systems  Weight (!) 356 lb (161.5 kg).There is no  height or weight on file to calculate BMI.  General Appearance: NA  Eye Contact:  NA  Speech:  Slow  Volume:  Normal  Mood:  Anxious  Affect:  NA  Thought Process:  Descriptions of Associations: Intact  Orientation:  Full (Time, Place, and Person)  Thought Content:  Rumination  Suicidal Thoughts:  No  Homicidal Thoughts:  No  Memory:  Immediate;   Fair Recent;   Fair Remote;   Fair  Judgement:  Fair  Insight:  Shallow  Psychomotor Activity:  NA  Concentration:  Concentration: Fair and Attention Span: Fair  Recall:  [employer] of Knowledge:  Fair  Language:  Good  Akathisia:  No  Handed:  Right  AIMS (if indicated):     Assets:  Communication Skills Desire for Improvement Housing Social Support  ADL's:  Intact  Cognition:  WNL  Sleep:   fair      Assessment and Plan: Bipolar disorder type I.  Anxiety.  Reinforced blood work as ordered hemoglobin A1c and lithium level.  Patient promised to have it done soon.  I also encouraged to keep therapy with Charolotte Eke.  Patient currently taking lithium 450 mg and I recommend to try 300 mg twice a day to help her mood lability.,  Continue Abilify 15 mg daily, Lamictal 300 mg daily and Klonopin 0.5 mg as needed.  She usually takes 3 to 4 tablets a week.  Recommended to call us back if she has any question  or any concern.  Follow-up in 6 weeks.  Follow Up Instructions:    I discussed the assessment and treatment plan with the patient. The patient was provided an opportunity to ask questions and all were answered. The patient agreed with the plan and demonstrated an understanding of the instructions.   The patient was advised to call back or seek an in-person evaluation if the symptoms worsen or if the condition fails to improve as anticipated.  I provided 18 minutes of non-face-to-face time during this encounter.   Kathlee Nations, MD

## 2021-01-19 ENCOUNTER — Inpatient Hospital Stay: Payer: Medicare Other | Attending: Nurse Practitioner

## 2021-01-19 ENCOUNTER — Inpatient Hospital Stay: Payer: Medicare Other

## 2021-01-19 ENCOUNTER — Other Ambulatory Visit: Payer: Self-pay

## 2021-01-19 VITALS — BP 131/73 | HR 75 | Temp 98.6°F | Resp 24

## 2021-01-19 DIAGNOSIS — D500 Iron deficiency anemia secondary to blood loss (chronic): Secondary | ICD-10-CM

## 2021-01-19 DIAGNOSIS — Z79899 Other long term (current) drug therapy: Secondary | ICD-10-CM | POA: Diagnosis not present

## 2021-01-19 DIAGNOSIS — N920 Excessive and frequent menstruation with regular cycle: Secondary | ICD-10-CM | POA: Diagnosis not present

## 2021-01-19 LAB — CMP (CANCER CENTER ONLY)
ALT: 18 U/L (ref 0–44)
AST: 13 U/L — ABNORMAL LOW (ref 15–41)
Albumin: 4.1 g/dL (ref 3.5–5.0)
Alkaline Phosphatase: 55 U/L (ref 38–126)
Anion gap: 8 (ref 5–15)
BUN: 12 mg/dL (ref 6–20)
CO2: 21 mmol/L — ABNORMAL LOW (ref 22–32)
Calcium: 9.6 mg/dL (ref 8.9–10.3)
Chloride: 108 mmol/L (ref 98–111)
Creatinine: 0.99 mg/dL (ref 0.44–1.00)
GFR, Estimated: >60 mL/min (ref >60)
Glucose, Bld: 107 mg/dL — ABNORMAL HIGH (ref 70–99)
Potassium: 3.5 mmol/L (ref 3.5–5.1)
Sodium: 137 mmol/L (ref 135–145)
Total Bilirubin: 0.7 mg/dL (ref 0.3–1.2)
Total Protein: 7.7 g/dL (ref 6.5–8.1)

## 2021-01-19 LAB — CBC WITH DIFFERENTIAL (CANCER CENTER ONLY)
Abs Immature Granulocytes: 0.06 10*3/uL (ref 0.00–0.07)
Basophils Absolute: 0 10*3/uL (ref 0.0–0.1)
Basophils Relative: 0 %
Eosinophils Absolute: 0.1 10*3/uL (ref 0.0–0.5)
Eosinophils Relative: 1 %
HCT: 38.1 % (ref 36.0–46.0)
Hemoglobin: 12.4 g/dL (ref 12.0–15.0)
Immature Granulocytes: 1 %
Lymphocytes Relative: 22 %
Lymphs Abs: 1.9 10*3/uL (ref 0.7–4.0)
MCH: 29.2 pg (ref 26.0–34.0)
MCHC: 32.5 g/dL (ref 30.0–36.0)
MCV: 89.9 fL (ref 80.0–100.0)
Monocytes Absolute: 0.8 10*3/uL (ref 0.1–1.0)
Monocytes Relative: 9 %
Neutro Abs: 5.7 10*3/uL (ref 1.7–7.7)
Neutrophils Relative %: 67 %
Platelet Count: 306 10*3/uL (ref 150–400)
RBC: 4.24 MIL/uL (ref 3.87–5.11)
RDW: 18.6 % — ABNORMAL HIGH (ref 11.5–15.5)
WBC Count: 8.5 10*3/uL (ref 4.0–10.5)
nRBC: 0 % (ref 0.0–0.2)

## 2021-01-19 LAB — FERRITIN: Ferritin: 41 ng/mL (ref 11–307)

## 2021-01-19 LAB — IRON AND TIBC
Iron: 70 ug/dL (ref 28–170)
Saturation Ratios: 20 % (ref 10.4–31.8)
TIBC: 358 ug/dL (ref 250–450)
UIBC: 288 ug/dL

## 2021-01-19 MED ORDER — SODIUM CHLORIDE 0.9 % IV SOLN
Freq: Once | INTRAVENOUS | Status: AC
  Filled 2021-01-19: qty 250

## 2021-01-19 MED ORDER — FAMOTIDINE 20 MG IN NS 100 ML IVPB
20.0000 mg | Freq: Once | INTRAVENOUS | Status: AC
  Administered 2021-01-19: 20 mg via INTRAVENOUS

## 2021-01-19 MED ORDER — SODIUM CHLORIDE 0.9 % IV SOLN
510.0000 mg | Freq: Once | INTRAVENOUS | Status: AC
  Administered 2021-01-19: 510 mg via INTRAVENOUS
  Filled 2021-01-19: qty 510

## 2021-01-19 MED ORDER — FAMOTIDINE 20 MG IN NS 100 ML IVPB
INTRAVENOUS | Status: AC
  Filled 2021-01-19: qty 100

## 2021-01-19 MED ORDER — DIPHENHYDRAMINE HCL 50 MG/ML IJ SOLN
INTRAMUSCULAR | Status: AC
  Filled 2021-01-19: qty 1

## 2021-01-19 MED ORDER — DIPHENHYDRAMINE HCL 50 MG/ML IJ SOLN
12.5000 mg | Freq: Once | INTRAMUSCULAR | Status: AC
  Administered 2021-01-19: 12.5 mg via INTRAVENOUS

## 2021-01-19 NOTE — Patient Instructions (Signed)
Ferumoxytol injection What is this medication? FERUMOXYTOL is an iron complex. Iron is used to make healthy red blood cells, which carry oxygen and nutrients throughout the body. This medicine is used totreat iron deficiency anemia. This medicine may be used for other purposes; ask your health care provider orpharmacist if you have questions. COMMON BRAND NAME(S): Feraheme What should I tell my care team before I take this medication? They need to know if you have any of these conditions: anemia not caused by low iron levels high levels of iron in the blood magnetic resonance imaging (MRI) test scheduled an unusual or allergic reaction to iron, other medicines, foods, dyes, or preservatives pregnant or trying to get pregnant breast-feeding How should I use this medication? This medicine is for injection into a vein. It is given by a health careprofessional in a hospital or clinic setting. Talk to your pediatrician regarding the use of this medicine in children.Special care may be needed. Overdosage: If you think you have taken too much of this medicine contact apoison control center or emergency room at once. NOTE: This medicine is only for you. Do not share this medicine with others. What if I miss a dose? It is important not to miss your dose. Call your doctor or health careprofessional if you are unable to keep an appointment. What may interact with this medication? This medicine may interact with the following medications: other iron products This list may not describe all possible interactions. Give your health care provider a list of all the medicines, herbs, non-prescription drugs, or dietary supplements you use. Also tell them if you smoke, drink alcohol, or use illegaldrugs. Some items may interact with your medicine. What should I watch for while using this medication? Visit your doctor or healthcare professional regularly. Tell your doctor or healthcare professional if your  symptoms do not start to get better or if theyget worse. You may need blood work done while you are taking this medicine. You may need to follow a special diet. Talk to your doctor. Foods that contain iron include: whole grains/cereals, dried fruits, beans, or peas, leafy greenvegetables, and organ meats (liver, kidney). What side effects may I notice from receiving this medication? Side effects that you should report to your doctor or health care professionalas soon as possible: allergic reactions like skin rash, itching or hives, swelling of the face, lips, or tongue breathing problems changes in blood pressure feeling faint or lightheaded, falls fever or chills flushing, sweating, or hot feelings swelling of the ankles or feet Side effects that usually do not require medical attention (report to yourdoctor or health care professional if they continue or are bothersome): diarrhea headache nausea, vomiting stomach pain This list may not describe all possible side effects. Call your doctor for medical advice about side effects. You may report side effects to FDA at1-800-FDA-1088. Where should I keep my medication? This drug is given in a hospital or clinic and will not be stored at home. NOTE: This sheet is a summary. It may not cover all possible information. If you have questions about this medicine, talk to your doctor, pharmacist, orhealth care provider.  2022 Elsevier/Gold Standard (2016-09-09 20:21:10)  

## 2021-01-19 NOTE — Progress Notes (Signed)
Pt. tolerated treatment well, no issues noted. Declines to stay for 30 minute post observation, states she does not have any issues. Left via ambulation, no respiratory distress noted.

## 2021-01-23 ENCOUNTER — Telehealth: Payer: BC Managed Care – PPO | Admitting: Adult Health

## 2021-01-23 NOTE — Progress Notes (Deleted)
PATIENT: Christine [name] DOB: May 24, 1976  REASON FOR VISIT: follow up HISTORY FROM: patient PRIMARY NEUROLOGIST:   Virtual Visit via Video Note  I connected with Christine [name] on 01/23/21 at  1:00 PM EDT by a video enabled telemedicine application located remotely at [facility] and verified that I am speaking with the correct person using two identifiers who was located at their own home.   I discussed the limitations of evaluation and management by telemedicine and the availability of in person appointments. The patient expressed understanding and agreed to proceed.   PATIENT: Christine [name] DOB: Nov 26, 1975  REASON FOR VISIT: follow up HISTORY FROM: patient  HISTORY OF PRESENT ILLNESS: Today 01/23/21:  Christine [name] is a 45 year old female with a history of obstructive sleep apnea on BIPAP.  She returns today for virtual visit.  She reports that the BiPAP is working well for her.  She denies any new issues.  She returns today for evaluation.    HISTORY 01/10/20:   Christine [name] is a 45 year old female with a history of obstructive sleep apnea on BiPAP.  Her download indicates that she use her machine nightly for compliance of 100%.  She used her machine greater than 4 hours 28 days for compliance of 93%.  On average she uses her machine 5 hours and 18 minutes.  Her residual AHI is 0.3 on 15/6 centimeters of water with pressure support of 4.  She reports that she is not as sleepy during the day.  Reports that she is tolerating the BiPAP well.  She returns today for an evaluation.  REVIEW OF SYSTEMS: Out of a complete 14 system review of symptoms, the patient complains only of the following symptoms, and all other reviewed systems are negative.  ALLERGIES: Allergies  Allergen Reactions   Penicillins Rash and Other (See Comments)    Has patient had a PCN reaction causing immediate rash, facial/tongue/throat swelling, SOB or lightheadedness with  hypotension: Yes Has patient had a PCN reaction causing severe rash involving mucus membranes or skin necrosis: Unknown Has patient had a PCN reaction that required hospitalization: No Has patient had a PCN reaction occurring within the last 10 years: No If all of the above answers are "NO", then may proceed with Cephalosporin use.     HOME MEDICATIONS: Outpatient Medications Prior to Visit  Medication Sig Dispense Refill   acetaminophen (TYLENOL) 325 MG tablet Take 650 mg by mouth every 6 (six) hours as needed for mild pain or headache.     amLODipine (NORVASC) 5 MG tablet Take 1 tablet (5 mg total) by mouth daily. 30 tablet 0   ARIPiprazole (ABILIFY) 15 MG tablet Take 1 tablet (15 mg total) by mouth daily. 30 tablet 1   chlorthalidone (HYGROTON) 25 MG tablet Take 0.5 tablets (12.5 mg total) by mouth daily. 60 tablet 2   clonazePAM (KLONOPIN) 0.5 MG tablet Take 1 tablet (0.5 mg total) by mouth daily as needed for anxiety. 30 tablet 0   fluticasone (FLONASE) 50 MCG/ACT nasal spray 1 spray in each nostril     lamoTRIgine (LAMICTAL) 150 MG tablet Take 1 tablet (150 mg total) by mouth 2 (two) times daily. For mood 60 tablet 1   lithium carbonate (LITHOBID) 300 MG CR tablet Take 1 tablet (300 mg total) by mouth 2 (two) times daily. 60 tablet 1   metFORMIN (GLUCOPHAGE) 500 MG tablet Take 1 tablet (500 mg total) by mouth 2 (two) times daily with a meal. 60 tablet 0  metoprolol succinate (TOPROL-XL) 100 MG 24 hr tablet TAKE 1 TABLET BY MOUTH DAILY WITH OR IMMEDIATELY FOLLOWING A MEAL 90 tablet 3   No facility-administered medications prior to visit.    PAST MEDICAL HISTORY: Past Medical History:  Diagnosis Date   Anemia    Presumably from menorrhagia/menomenorrhagia; has had both blood and iron transfusions   Anxiety    Back pain    Bipolar 1 disorder ([place])    With depression   Depression    Essential hypertension    Fibromyalgia    Headache    History of blood transfusion 08/2017    [facility]   History of hiatal hernia    Infertility, female    Lactose intolerance    Lower extremity edema    Migraines    Morbid obesity (H[facility])    Osteoarthritis    Hands, ankles; HLA-B27 positive   PCOS (polycystic ovarian syndrome)    Seasonal allergies    With recurrent allergic rhinitis   Uterine leiomyoma    Vitamin D deficiency     PAST SURGICAL HISTORY: Past Surgical History:  Procedure Laterality Date   CARDIAC CT ANGIOGRAM  03/2019    Ca Score 0. Normal Coronary origin R dominant. No evidence of CAD. ? Liver nodules - poorly visualized (consider MRI w & w/o Gad contrast)   CERVICAL POLYPECTOMY  01/15/2018   Procedure: CERVICAL POLYPECTOMY;  Surgeon: Arvella Nigh, MD;  Location: [facility];  Service: Gynecology;;   COLONOSCOPY  2008   Normal   DIAGNOSTIC LAPAROSCOPY  08/1999   dermoid cyst, RSO   DILATION AND CURETTAGE OF UTERUS  05/2004   MAB   DILATION AND CURETTAGE OF UTERUS  04/2015   HYSTEROSCOPY WITH D & C  07/22/2011   Procedure: DILATATION AND CURETTAGE /HYSTEROSCOPY;  Surgeon: Cyril Mourning, MD;  Location: [facility];  Service: Gynecology;;   HYSTEROSCOPY WITH D & C N/A 01/15/2018   Procedure: DILATATION AND CURETTAGE [place] WITH MYOSURE;  Surgeon: Arvella Nigh, MD;  Location: [facility];  Service: Gynecology;  Laterality: N/A;   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  Suggestion of high cardiac output (possibly related to anemia thyrotoxicosis, Pregnancy, sepsis etc.) -- was in setting of symptomatic anemia - Hgb 5.7    FAMILY HISTORY: Family History  Problem Relation Age of Onset   Anxiety disorder Mother    Depression Mother    Cancer Mother        pancreatic    High blood pressure Mother    Hypertension Mother    Hyperlipidemia Mother    Cancer Father        colon   High blood pressure Father    Diabetes Father    Hypertension Father    Hyperlipidemia Father     Heart disease Father    Stroke Father    Kidney disease Father    Sleep apnea Father    Obesity Father    Cancer Brother 6       leukemia, childhood   High blood pressure Brother    Migraines Neg Hx     SOCIAL HISTORY: Social History   Socioeconomic History   Marital status: Married    Spouse name: Amyri Frenz   Number of children: 2   Years of education: 2 years of grad school + bach & HS   Highest education level: Bachelor's degree (e.g., BA, AB, BS)  Occupational History   Occupation: disabled  Tobacco Use   Smoking status: Never   Smokeless tobacco: Never  Vaping Use   Vaping Use: Never used  Substance and Sexual Activity   Alcohol use: Yes    Comment: occasional, maybe once a month or so   Drug use: No   Sexual activity: Yes    Partners: Male    Birth control/protection: None  Other Topics Concern   Not on file  Social History Narrative   ** Merged History Encounter **       Lives at home with her husband and 2 adopted daughters. Right handed Caffeine: decreased intake, drinks rarely  Is on long-term disability.   Social Determinants of Health   Financial Resource Strain: Not on file  Food Insecurity: Not on file  Transportation Needs: Not on file  Physical Activity: Not on file  Stress: Not on file  Social Connections: Not on file  Intimate Partner Violence: Not At Risk   Fear of Current or Ex-Partner: No   Emotionally Abused: No   Physically Abused: No   Sexually Abused: No      PHYSICAL EXAM Generalized: Well developed, in no acute distress   Neurological examination  Mentation: Alert oriented to time, place, history taking. Follows all commands speech and language fluent Cranial nerve II-XII:Extraocular movements were full. Facial symmetry noted. uvula tongue midline. Head turning and shoulder shrug  were normal and symmetric. Motor: Good strength throughout subjectively per patient Sensory: Sensory testing is intact to soft touch on all  4 extremities subjectively per patient Coordination: Cerebellar testing reveals good finger-nose-finger  Gait and station: Patient is able to stand from a seated position. gait is normal.  Reflexes: UTA  DIAGNOSTIC DATA (LABS, IMAGING, TESTING) - I reviewed patient records, labs, notes, testing and imaging myself where available.  Lab Results  Component Value Date   WBC 8.5 01/19/2021   HGB 12.4 01/19/2021   HCT 38.1 01/19/2021   MCV 89.9 01/19/2021   PLT 306 01/19/2021      Component Value Date/Time   NA 137 01/19/2021 0805   NA 138 09/28/2020 1213   K 3.5 01/19/2021 0805   CL 108 01/19/2021 0805   CO2 21 (L) 01/19/2021 0805   GLUCOSE 107 (H) 01/19/2021 0805   BUN 12 01/19/2021 0805   BUN 12 09/28/2020 1213   CREATININE 0.99 01/19/2021 0805   CALCIUM 9.6 01/19/2021 0805   PROT 7.7 01/19/2021 0805   PROT 7.0 10/07/2018 1219   ALBUMIN 4.1 01/19/2021 0805   ALBUMIN 4.2 10/07/2018 1219   AST 13 (L) 01/19/2021 0805   ALT 18 01/19/2021 0805   ALKPHOS 55 01/19/2021 0805   BILITOT 0.7 01/19/2021 0805   GFRNONAA >60 01/19/2021 0805   GFRAA 103 09/28/2020 1213   GFRAA >60 02/16/2019 0835   Lab Results  Component Value Date   CHOL 183 10/07/2018   HDL 38 (L) 10/07/2018   LDLCALC 131 (H) 10/07/2018   TRIG 70 10/07/2018   Lab Results  Component Value Date   HGBA1C 5.7 (H) 10/07/2018   Lab Results  Component Value Date   VITAMINB12 420 10/07/2018   Lab Results  Component Value Date   TSH 1.215 08/08/2019      ASSESSMENT AND PLAN 45 y.o. year old female  has a past medical history of Anemia, Anxiety, Back pain, Bipolar 1 disorder ([place]), Depression, Essential hypertension, Fibromyalgia, Headache, History of blood transfusion (08/2017), History of hiatal hernia, Infertility, female, Lactose intolerance, Lower  extremity edema, Migraines, Morbid obesity (HCC), Osteoarthritis, PCOS (polycystic ovarian syndrome), Seasonal allergies, Uterine leiomyoma, and Vitamin D  deficiency. here with:  OSA on BIPAP  BIPAP compliance excellent Residual AHI is good Encouraged patient to continue using CPAP nightly and > 4 hours each night F/U in 1 year or sooner if needed  I spent 20 minutes of face-to-face and non-face-to-face time with patient.  This included previsit chart review, lab review, study review, order entry, electronic health record documentation, patient education.  Ward Givens, MSN, NP-C 01/23/2021, 10:53 AM [facility] [address] [place], [place] [address] [phone]

## 2021-01-24 DIAGNOSIS — I10 Essential (primary) hypertension: Secondary | ICD-10-CM | POA: Diagnosis not present

## 2021-01-24 DIAGNOSIS — B351 Tinea unguium: Secondary | ICD-10-CM | POA: Diagnosis not present

## 2021-01-24 DIAGNOSIS — F319 Bipolar disorder, unspecified: Secondary | ICD-10-CM | POA: Diagnosis not present

## 2021-01-24 DIAGNOSIS — D509 Iron deficiency anemia, unspecified: Secondary | ICD-10-CM | POA: Diagnosis not present

## 2021-01-29 DIAGNOSIS — L309 Dermatitis, unspecified: Secondary | ICD-10-CM | POA: Diagnosis not present

## 2021-01-29 DIAGNOSIS — B351 Tinea unguium: Secondary | ICD-10-CM | POA: Diagnosis not present

## 2021-01-29 DIAGNOSIS — B353 Tinea pedis: Secondary | ICD-10-CM | POA: Diagnosis not present

## 2021-02-26 ENCOUNTER — Other Ambulatory Visit: Payer: Self-pay

## 2021-02-26 ENCOUNTER — Inpatient Hospital Stay: Payer: Medicare Other | Attending: Nurse Practitioner

## 2021-02-26 DIAGNOSIS — N920 Excessive and frequent menstruation with regular cycle: Secondary | ICD-10-CM | POA: Insufficient documentation

## 2021-02-26 DIAGNOSIS — D500 Iron deficiency anemia secondary to blood loss (chronic): Secondary | ICD-10-CM | POA: Diagnosis not present

## 2021-02-26 LAB — CBC WITH DIFFERENTIAL (CANCER CENTER ONLY)
Abs Immature Granulocytes: 0.08 K/uL — ABNORMAL HIGH (ref 0.00–0.07)
Basophils Absolute: 0 K/uL (ref 0.0–0.1)
Basophils Relative: 0 %
Eosinophils Absolute: 0.1 K/uL (ref 0.0–0.5)
Eosinophils Relative: 2 %
HCT: 36 % (ref 36.0–46.0)
Hemoglobin: 11.7 g/dL — ABNORMAL LOW (ref 12.0–15.0)
Immature Granulocytes: 1 %
Lymphocytes Relative: 21 %
Lymphs Abs: 1.7 K/uL (ref 0.7–4.0)
MCH: 31.1 pg (ref 26.0–34.0)
MCHC: 32.5 g/dL (ref 30.0–36.0)
MCV: 95.7 fL (ref 80.0–100.0)
Monocytes Absolute: 0.6 K/uL (ref 0.1–1.0)
Monocytes Relative: 8 %
Neutro Abs: 5.8 K/uL (ref 1.7–7.7)
Neutrophils Relative %: 68 %
Platelet Count: 288 K/uL (ref 150–400)
RBC: 3.76 MIL/uL — ABNORMAL LOW (ref 3.87–5.11)
RDW: 14.9 % (ref 11.5–15.5)
WBC Count: 8.4 K/uL (ref 4.0–10.5)
nRBC: 0 % (ref 0.0–0.2)

## 2021-02-26 LAB — IRON AND TIBC
Iron: 63 ug/dL (ref 41–142)
Saturation Ratios: 22 % (ref 21–57)
TIBC: 288 ug/dL (ref 236–444)
UIBC: 225 ug/dL (ref 120–384)

## 2021-02-26 LAB — FERRITIN: Ferritin: 42 ng/mL (ref 11–307)

## 2021-02-28 ENCOUNTER — Inpatient Hospital Stay: Payer: Medicare Other

## 2021-02-28 ENCOUNTER — Other Ambulatory Visit: Payer: Self-pay

## 2021-02-28 VITALS — BP 147/94 | HR 100 | Temp 98.7°F | Resp 18

## 2021-02-28 DIAGNOSIS — D500 Iron deficiency anemia secondary to blood loss (chronic): Secondary | ICD-10-CM

## 2021-02-28 DIAGNOSIS — N920 Excessive and frequent menstruation with regular cycle: Secondary | ICD-10-CM | POA: Diagnosis not present

## 2021-02-28 MED ORDER — DIPHENHYDRAMINE HCL 50 MG/ML IJ SOLN
INTRAMUSCULAR | Status: AC
  Filled 2021-02-28: qty 1

## 2021-02-28 MED ORDER — DIPHENHYDRAMINE HCL 50 MG/ML IJ SOLN
12.5000 mg | Freq: Once | INTRAMUSCULAR | Status: AC
  Administered 2021-02-28: 12.5 mg via INTRAVENOUS

## 2021-02-28 MED ORDER — SODIUM CHLORIDE 0.9 % IV SOLN
Freq: Once | INTRAVENOUS | Status: AC
  Filled 2021-02-28: qty 250

## 2021-02-28 MED ORDER — FAMOTIDINE 20 MG IN NS 100 ML IVPB
20.0000 mg | Freq: Once | INTRAVENOUS | Status: AC
Start: 2021-02-28 — End: 2021-02-28
  Administered 2021-02-28: 20 mg via INTRAVENOUS

## 2021-02-28 MED ORDER — FAMOTIDINE 20 MG IN NS 100 ML IVPB
INTRAVENOUS | Status: AC
  Filled 2021-02-28: qty 100

## 2021-02-28 MED ORDER — SODIUM CHLORIDE 0.9 % IV SOLN
510.0000 mg | Freq: Once | INTRAVENOUS | Status: AC
  Administered 2021-02-28: 510 mg via INTRAVENOUS
  Filled 2021-02-28: qty 510

## 2021-02-28 NOTE — Patient Instructions (Signed)

## 2021-03-12 DIAGNOSIS — I10 Essential (primary) hypertension: Secondary | ICD-10-CM | POA: Diagnosis not present

## 2021-03-12 DIAGNOSIS — F319 Bipolar disorder, unspecified: Secondary | ICD-10-CM | POA: Diagnosis not present

## 2021-03-12 DIAGNOSIS — U071 COVID-19: Secondary | ICD-10-CM | POA: Diagnosis not present

## 2021-04-02 ENCOUNTER — Other Ambulatory Visit: Payer: Self-pay

## 2021-04-02 ENCOUNTER — Ambulatory Visit (INDEPENDENT_AMBULATORY_CARE_PROVIDER_SITE_OTHER): Payer: Medicare Other | Admitting: Psychiatry

## 2021-04-02 ENCOUNTER — Encounter (HOSPITAL_COMMUNITY): Payer: Self-pay | Admitting: Psychiatry

## 2021-04-02 DIAGNOSIS — F3162 Bipolar disorder, current episode mixed, moderate: Secondary | ICD-10-CM

## 2021-04-02 NOTE — Progress Notes (Signed)
Virtual Visit via Video Note  I connected with [name] on 04/02/21 at  1:30 PM EDT by a video enabled telemedicine application and verified that I am speaking with the correct person using two identifiers.  Location: Patient: Patient Home Provider: Home Office   I discussed the limitations of evaluation and management by telemedicine and the availability of in person appointments. The patient expressed understanding and agreed to proceed.   History of Present Illness: Bipolar 1 DO and GAD   Treatment Plan Goals: 1) Client would like to become more organized by following a daily structure 5 out of 7 days.  2) Client will leave her home 5 out of 7 days to exercise, run errands or do family activities. 3) Client will engage in daily self-care practices and one significant self-care actity once monthly, such as a massage, hair care, nail care, etc.   Observations/Objective: Counselor met with Client for individual therapy via Webex. Counselor assessed symptoms and functioning, with Client reporting that since our last session her father had unexpectedly passed away, her and her family contracted COVID, and due to financial issues, she will be returning to work next week. Counselor and Client processed impact of losses and changes on daily functioning and treatment plan goals. Counselor provided psychoeducation on grief and loss work/cycle. Counselor provided information on grief and loss support groups and services in the area to engage. Client appreciative and is working to regain schedule and routine, post significant life events. Client presents with moderate depressive symptoms and moderate anxiety. Client denied safety issues, with no SI/HI/AVH/Self-harm experienced since last session. Client reports taking medications as prescribed 7 out of 7 days. Client to follow up with psychiatrist next month.    Goals 1-3, Counselor assessed progress and skills used to accomplish/address goals  using MI and CBT interventions. Client notes that he children have returned to school and she will begin full time work next week. Client is hopeful this will assist in structuring day in a healthy and predictable manner. Counselor assessed utilization and communication with husband and children to meet household goals. Client noted improved communication and more intentional work on long-term goals. Client is looking forward to working again and hopes to better manage mental health symptoms, learning from past working experiences.   Client reports not meeting goal of leaving home 5 of 7 days per week for exercise and errands. Client shared recent history of meeting goal, until father passed and family contracted COVID. Client is hopeful to return to these healthy habits and behaviors once new schedule begins. Counselor reviewed anxiety management strategies when in public settings.  Client denies ongoing and consistent self-care practices. Client expressed gratitude for being reminded in session and was able to identify several ways she can incorporate practices in daily routine. Client expressed need to reengage in practices and in meeting today, she will use as a reset button on applying skills and modeling healthy behaviors for others in her home.  Counselor identified strengths, extended encouragement and reviewed grief and loss process. Client to reengage in services twice monthly.      Assessment and Plan: New Counselor will continue to meet with patient to address and reestablish treatment plan goals. Patient will continue to follow recommendations of providers and implement skills learned in session.   Follow Up Instructions: New Counselor will send information for next session via Webex.    The patient was advised to call back or seek an in-person evaluation if the symptoms worsen or if  the condition fails to improve as anticipated.   I provided 55 minutes of non-face-to-face time during  this encounter.     Lise Auer, LCSW

## 2021-04-03 ENCOUNTER — Encounter: Payer: Self-pay | Admitting: Hematology

## 2021-04-16 ENCOUNTER — Ambulatory Visit (INDEPENDENT_AMBULATORY_CARE_PROVIDER_SITE_OTHER): Payer: Medicare Other | Admitting: Psychiatry

## 2021-04-16 ENCOUNTER — Ambulatory Visit (HOSPITAL_COMMUNITY): Payer: Medicare Other | Admitting: Psychiatry

## 2021-04-16 ENCOUNTER — Encounter (HOSPITAL_COMMUNITY): Payer: Self-pay | Admitting: Psychiatry

## 2021-04-16 ENCOUNTER — Other Ambulatory Visit: Payer: Self-pay

## 2021-04-16 DIAGNOSIS — F3162 Bipolar disorder, current episode mixed, moderate: Secondary | ICD-10-CM

## 2021-04-16 NOTE — Progress Notes (Signed)
Virtual Visit via Video Note  I connected with [name] on 04/16/21 at  1:00 PM EDT by a video enabled telemedicine application and verified that I am speaking with the correct person using two identifiers.  Location: Patient: Patient Home Provider: Home Office   I discussed the limitations of evaluation and management by telemedicine and the availability of in person appointments. The patient expressed understanding and agreed to proceed.   History of Present Illness: Bipolar 1 DO and GAD   Treatment Plan Goals: 1) Client would like to become more organized by following a daily structure 5 out of 7 days.  2) Client will leave her home 5 out of 7 days to exercise, run errands or do family activities. 3) Client will engage in daily self-care practices and one significant self-care actity once monthly, such as a massage, hair care, nail care, etc.   Observations/Objective: Counselor met with Client for individual therapy via Webex. 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 Client presents with mild depressive symptoms and mild anxiety. Client denied safety issues, with no SI/HI/AVH/Self-harm experienced since last session. Client reports taking medications as prescribed 7 out of 7 days. Client to follow up with psychiatrist later this month.   Goals 1-3, Counselor assessed progress and skills used to accomplish/address goals using MI and CBT interventions. Client reports feeling overwhelmed over the weekend, stopping, checking in with feelings then deciding on an action step. Client decided to utilize a planner to look at responsibilities over next 3 months. Client noted that she penciled in self-care time and was specific on tasks of each event. Client noted decreased anxiety and more positive thoughts and feelings of self. Counselor praised Client for  utilization of skills and discussed how to incorporated in future.    Client reports not meeting goal of leaving home 5 of 7 days per week for exercise and errands. Client shared that she is feeling more balanced and outgoing over the past few weeks. Client left home to prepare for return to work and school. Client incorporating exercise in upcoming schedule.   Client denies ongoing and consistent self-care practices. Client plans to incorporate 30 minutes at the end of her day for self-reflection, planning and/or night time routine. Client working to go to bed earlier for longer sleep times.   Counselor shared that today would be our last session, as Counselor with be ending time with [facility] effective October 1. Client was understanding and we discussed an appropriate transfer of care. Client to remain established with practice for counseling and psychiatry. Client aware of how to follow up and follow safety plan if needed.    Assessment and Plan: New Counselor will continue to meet with patient to address and reestablish treatment plan goals. Patient will continue to follow recommendations of providers and implement skills learned in session.   Follow Up Instructions: New Counselor will send information for next session via Webex.    The patient was advised to call back or seek an in-person evaluation if the symptoms worsen or if the condition fails to improve as anticipated.   I provided 30 minutes of non-face-to-face time during this encounter.     Lise Auer, LCSW

## 2021-04-30 ENCOUNTER — Inpatient Hospital Stay: Payer: Medicare Other | Attending: Nurse Practitioner

## 2021-05-01 NOTE — Progress Notes (Deleted)
[place]   [phone] [phone]   Clinic Follow up Note   Patient Care Team: Scifres, Durel Salts as PCP - General (Physician Assistant) [name], MD as PCP - Cardiology (Cardiology) 05/01/2021  CHIEF COMPLAINT:  Follow up IDA  CURRENT THERAPY:  IV Feraheme as needed  INTERVAL HISTORY: Christine [name] returns for follow up and treatment as scheduled. Last seen by me 12/27/20. Received IV Feraheme in 01/19/21 and 02/28/21.   REVIEW OF SYSTEMS:   Constitutional: Denies fevers, chills or abnormal weight loss Eyes: Denies blurriness of vision Ears, nose, mouth, throat, and face: Denies mucositis or sore throat Respiratory: Denies cough, dyspnea or wheezes Cardiovascular: Denies palpitation, chest discomfort or lower extremity swelling Gastrointestinal:  Denies nausea, heartburn or change in bowel habits Skin: Denies abnormal skin rashes Lymphatics: Denies new lymphadenopathy or easy bruising Neurological:Denies numbness, tingling or new weaknesses Behavioral/Psych: Mood is stable, no new changes  All other systems were reviewed with the patient and are negative.  MEDICAL HISTORY:  Past Medical History:  Diagnosis Date   Anemia    Presumably from menorrhagia/menomenorrhagia; has had both blood and iron transfusions   Anxiety    Back pain    Bipolar 1 disorder ([place])    With depression   Depression    Essential hypertension    Fibromyalgia    Headache    History of blood transfusion 08/2017   [facility]   History of hiatal hernia    Infertility, female    Lactose intolerance    Lower extremity edema    Migraines    Morbid obesity (H[facility])    Osteoarthritis    Hands, ankles; HLA-B27 positive   PCOS (polycystic ovarian syndrome)    Seasonal allergies    With recurrent allergic rhinitis   Uterine leiomyoma    Vitamin D deficiency     SURGICAL HISTORY: Past Surgical History:  Procedure Laterality Date   CARDIAC CT ANGIOGRAM  03/2019     Ca Score 0. Normal Coronary origin R dominant. No evidence of CAD. ? Liver nodules - poorly visualized (consider MRI w & w/o Gad contrast)   CERVICAL POLYPECTOMY  01/15/2018   Procedure: CERVICAL POLYPECTOMY;  Surgeon: Arvella Nigh, MD;  Location: [facility];  Service: Gynecology;;   COLONOSCOPY  2008   Normal   DIAGNOSTIC LAPAROSCOPY  08/1999   dermoid cyst, RSO   DILATION AND CURETTAGE OF UTERUS  05/2004   MAB   DILATION AND CURETTAGE OF UTERUS  04/2015   HYSTEROSCOPY WITH D & C  07/22/2011   Procedure: DILATATION AND CURETTAGE /HYSTEROSCOPY;  Surgeon: Cyril Mourning, MD;  Location: [facility];  Service: Gynecology;;   HYSTEROSCOPY WITH D & C N/A 01/15/2018   Procedure: DILATATION AND CURETTAGE [place] WITH MYOSURE;  Surgeon: Arvella Nigh, MD;  Location: [facility];  Service: Gynecology;  Laterality: N/A;   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  Suggestion of high cardiac output (possibly related to anemia thyrotoxicosis, Pregnancy, sepsis etc.) -- was in setting of symptomatic anemia - Hgb 5.7    I have reviewed the social history and family history with the patient and they are unchanged from previous note.  ALLERGIES:  is allergic to penicillins.  MEDICATIONS:  Current Outpatient Medications  Medication Sig Dispense Refill   acetaminophen (TYLENOL) 325 MG tablet Take 650 mg by mouth every 6 (six) hours as needed  for mild pain or headache.     amLODipine (NORVASC) 5 MG tablet Take 1 tablet (5 mg total) by mouth daily. 30 tablet 0   ARIPiprazole (ABILIFY) 15 MG tablet Take 1 tablet (15 mg total) by mouth daily. 30 tablet 1   chlorthalidone (HYGROTON) 25 MG tablet Take 0.5 tablets (12.5 mg total) by mouth daily. 60 tablet 2   clonazePAM (KLONOPIN) 0.5 MG tablet Take 1 tablet (0.5 mg total) by mouth daily as needed for anxiety. 30 tablet 0   fluticasone (FLONASE) 50 MCG/ACT nasal spray 1 spray in  each nostril     lamoTRIgine (LAMICTAL) 150 MG tablet Take 1 tablet (150 mg total) by mouth 2 (two) times daily. For mood 60 tablet 1   lithium carbonate (LITHOBID) 300 MG CR tablet Take 1 tablet (300 mg total) by mouth 2 (two) times daily. 60 tablet 1   metFORMIN (GLUCOPHAGE) 500 MG tablet Take 1 tablet (500 mg total) by mouth 2 (two) times daily with a meal. 60 tablet 0   metoprolol succinate (TOPROL-XL) 100 MG 24 hr tablet TAKE 1 TABLET BY MOUTH DAILY WITH OR IMMEDIATELY FOLLOWING A MEAL 90 tablet 3   No current facility-administered medications for this visit.    PHYSICAL EXAMINATION: ECOG PERFORMANCE STATUS: {CHL ONC ECOG PS:[phone]}  There were no vitals filed for this visit. There were no vitals filed for this visit.  GENERAL:alert, no distress and comfortable SKIN: skin color, texture, turgor are normal, no rashes or significant lesions EYES: normal, Conjunctiva are pink and non-injected, sclera clear OROPHARYNX:no exudate, no erythema and lips, buccal mucosa, and tongue normal  NECK: supple, thyroid normal size, non-tender, without nodularity LYMPH:  no palpable lymphadenopathy in the cervical, axillary or inguinal LUNGS: clear to auscultation and percussion with normal breathing effort HEART: regular rate & rhythm and no murmurs and no lower extremity edema ABDOMEN:abdomen soft, non-tender and normal bowel sounds Musculoskeletal:no cyanosis of digits and no clubbing  NEURO: alert & oriented x 3 with fluent speech, no focal motor/sensory deficits  LABORATORY DATA:  I have reviewed the data as listed CBC Latest Ref Rng & Units 02/26/2021 01/19/2021 12/27/2020  WBC 4.0 - 10.5 K/uL 8.4 8.5 8.1  Hemoglobin 12.0 - 15.0 g/dL 11.7(L) 12.4 11.7(L)  Hematocrit 36.0 - 46.0 % 36.0 38.1 36.9  Platelets 150 - 400 K/uL 288 306 283     CMP Latest Ref Rng & Units 01/19/2021 11/01/2020 09/28/2020  Glucose 70 - 99 mg/dL 107(H) 135(H) 108(H)  BUN 6 - 20 mg/dL $Remove'12 13 12  'nysyvuQ$ Creatinine 0.44 -  1.00 mg/dL 0.99 0.89 0.81  Sodium 135 - 145 mmol/L 137 138 138  Potassium 3.5 - 5.1 mmol/L 3.5 3.7 4.0  Chloride 98 - 111 mmol/L 108 108 102  CO2 22 - 32 mmol/L 21(L) 22 21  Calcium 8.9 - 10.3 mg/dL 9.6 8.9 9.9  Total Protein 6.5 - 8.1 g/dL 7.7 7.4 -  Total Bilirubin 0.3 - 1.2 mg/dL 0.7 0.5 -  Alkaline Phos 38 - 126 U/L 55 69 -  AST 15 - 41 U/L 13(L) 8(L) -  ALT 0 - 44 U/L 18 14 -      RADIOGRAPHIC STUDIES: I have personally reviewed the radiological images as listed and agreed with the findings in the report. No results found.   ASSESSMENT & PLAN:  No problem-specific Assessment & Plan notes found for this encounter.   No orders of the defined types were placed in this encounter.  All questions were answered. The  patient knows to call the clinic with any problems, questions or concerns. No barriers to learning was detected. I spent {CHL ONC TIME VISIT - HRCBU:[id number]} counseling the patient face to face. The total time spent in the appointment was {CHL ONC TIME VISIT - HOZYY:[id number]} and more than 50% was on counseling and review of test results     Alla Feeling, NP 05/01/21

## 2021-05-02 ENCOUNTER — Inpatient Hospital Stay: Payer: Medicare Other

## 2021-05-02 ENCOUNTER — Inpatient Hospital Stay: Payer: Medicare Other | Admitting: Nurse Practitioner

## 2021-05-16 ENCOUNTER — Other Ambulatory Visit (HOSPITAL_COMMUNITY): Payer: Self-pay | Admitting: Psychiatry

## 2021-05-16 DIAGNOSIS — F3162 Bipolar disorder, current episode mixed, moderate: Secondary | ICD-10-CM

## 2021-05-21 ENCOUNTER — Encounter: Payer: Self-pay | Admitting: Hematology

## 2021-05-21 ENCOUNTER — Inpatient Hospital Stay: Payer: Medicare Other | Attending: Nurse Practitioner

## 2021-05-21 ENCOUNTER — Other Ambulatory Visit: Payer: Self-pay

## 2021-05-21 DIAGNOSIS — N920 Excessive and frequent menstruation with regular cycle: Secondary | ICD-10-CM | POA: Diagnosis not present

## 2021-05-21 DIAGNOSIS — D500 Iron deficiency anemia secondary to blood loss (chronic): Secondary | ICD-10-CM | POA: Diagnosis not present

## 2021-05-21 DIAGNOSIS — Z79899 Other long term (current) drug therapy: Secondary | ICD-10-CM | POA: Diagnosis not present

## 2021-05-21 LAB — CBC WITH DIFFERENTIAL (CANCER CENTER ONLY)
Abs Immature Granulocytes: 0.09 10*3/uL — ABNORMAL HIGH (ref 0.00–0.07)
Basophils Absolute: 0 10*3/uL (ref 0.0–0.1)
Basophils Relative: 0 %
Eosinophils Absolute: 0.2 10*3/uL (ref 0.0–0.5)
Eosinophils Relative: 2 %
HCT: 32.3 % — ABNORMAL LOW (ref 36.0–46.0)
Hemoglobin: 10.5 g/dL — ABNORMAL LOW (ref 12.0–15.0)
Immature Granulocytes: 1 %
Lymphocytes Relative: 14 %
Lymphs Abs: 1.6 10*3/uL (ref 0.7–4.0)
MCH: 30.7 pg (ref 26.0–34.0)
MCHC: 32.5 g/dL (ref 30.0–36.0)
MCV: 94.4 fL (ref 80.0–100.0)
Monocytes Absolute: 0.9 10*3/uL (ref 0.1–1.0)
Monocytes Relative: 8 %
Neutro Abs: 8.3 10*3/uL — ABNORMAL HIGH (ref 1.7–7.7)
Neutrophils Relative %: 75 %
Platelet Count: 253 10*3/uL (ref 150–400)
RBC: 3.42 MIL/uL — ABNORMAL LOW (ref 3.87–5.11)
RDW: 14.4 % (ref 11.5–15.5)
WBC Count: 11 10*3/uL — ABNORMAL HIGH (ref 4.0–10.5)
nRBC: 0.2 % (ref 0.0–0.2)

## 2021-05-21 LAB — IRON AND TIBC
Iron: 40 ug/dL — ABNORMAL LOW (ref 41–142)
Saturation Ratios: 13 % — ABNORMAL LOW (ref 21–57)
TIBC: 299 ug/dL (ref 236–444)
UIBC: 259 ug/dL (ref 120–384)

## 2021-05-21 LAB — CMP (CANCER CENTER ONLY)
ALT: 15 U/L (ref 0–44)
AST: 9 U/L — ABNORMAL LOW (ref 15–41)
Albumin: 4 g/dL (ref 3.5–5.0)
Alkaline Phosphatase: 60 U/L (ref 38–126)
Anion gap: 11 (ref 5–15)
BUN: 10 mg/dL (ref 6–20)
CO2: 24 mmol/L (ref 22–32)
Calcium: 9.6 mg/dL (ref 8.9–10.3)
Chloride: 107 mmol/L (ref 98–111)
Creatinine: 0.85 mg/dL (ref 0.44–1.00)
GFR, Estimated: >60 mL/min (ref >60)
Glucose, Bld: 95 mg/dL (ref 70–99)
Potassium: 3.6 mmol/L (ref 3.5–5.1)
Sodium: 142 mmol/L (ref 135–145)
Total Bilirubin: 0.5 mg/dL (ref 0.3–1.2)
Total Protein: 7.3 g/dL (ref 6.5–8.1)

## 2021-05-21 LAB — FERRITIN: Ferritin: 27 ng/mL (ref 11–307)

## 2021-05-22 NOTE — Telephone Encounter (Signed)
Received a fax from [facility] on [address] [facility] requesting a refill on patient's Lithium Carbonate 300mg  CR tablet. Patient has followup appointment scheduled for 10/24. Please review and advise. Thank you

## 2021-05-23 ENCOUNTER — Inpatient Hospital Stay: Payer: Medicare Other

## 2021-05-23 ENCOUNTER — Inpatient Hospital Stay: Payer: Medicare Other | Admitting: Nurse Practitioner

## 2021-05-23 DIAGNOSIS — J019 Acute sinusitis, unspecified: Secondary | ICD-10-CM | POA: Diagnosis not present

## 2021-05-28 ENCOUNTER — Telehealth (HOSPITAL_COMMUNITY): Payer: Medicare Other | Admitting: Psychiatry

## 2021-05-28 DIAGNOSIS — N920 Excessive and frequent menstruation with regular cycle: Secondary | ICD-10-CM | POA: Diagnosis not present

## 2021-05-28 DIAGNOSIS — D649 Anemia, unspecified: Secondary | ICD-10-CM | POA: Diagnosis not present

## 2021-05-28 DIAGNOSIS — D259 Leiomyoma of uterus, unspecified: Secondary | ICD-10-CM | POA: Diagnosis not present

## 2021-05-29 ENCOUNTER — Encounter: Payer: Self-pay | Admitting: Nurse Practitioner

## 2021-05-29 ENCOUNTER — Other Ambulatory Visit: Payer: Self-pay | Admitting: Nurse Practitioner

## 2021-05-29 ENCOUNTER — Ambulatory Visit: Payer: BC Managed Care – PPO

## 2021-05-31 ENCOUNTER — Telehealth: Payer: Self-pay

## 2021-05-31 ENCOUNTER — Ambulatory Visit: Payer: BC Managed Care – PPO

## 2021-05-31 NOTE — Telephone Encounter (Signed)
This nurse reached out to patient related to her infusion appointments.  Unable to reach patient by phone and voicemail did not pick up .  This nurse will also reach out via My Chart.  No other concerns at this time.

## 2021-06-01 ENCOUNTER — Other Ambulatory Visit: Payer: Self-pay

## 2021-06-01 ENCOUNTER — Inpatient Hospital Stay: Payer: Medicare Other

## 2021-06-01 ENCOUNTER — Telehealth (HOSPITAL_COMMUNITY): Payer: Medicare Other | Admitting: Psychiatry

## 2021-06-01 VITALS — BP 138/86 | HR 84 | Temp 99.3°F | Resp 18

## 2021-06-01 DIAGNOSIS — D500 Iron deficiency anemia secondary to blood loss (chronic): Secondary | ICD-10-CM | POA: Diagnosis not present

## 2021-06-01 DIAGNOSIS — Z79899 Other long term (current) drug therapy: Secondary | ICD-10-CM | POA: Diagnosis not present

## 2021-06-01 DIAGNOSIS — N920 Excessive and frequent menstruation with regular cycle: Secondary | ICD-10-CM | POA: Diagnosis not present

## 2021-06-01 MED ORDER — SODIUM CHLORIDE 0.9 % IV SOLN: Freq: Once | INTRAVENOUS | Status: AC

## 2021-06-01 MED ORDER — SODIUM CHLORIDE 0.9 % IV SOLN
300.0000 mg | Freq: Once | INTRAVENOUS | Status: AC
  Administered 2021-06-01: 300 mg via INTRAVENOUS
  Filled 2021-06-01: qty 300

## 2021-06-01 NOTE — Patient Instructions (Signed)

## 2021-06-01 NOTE — Progress Notes (Signed)
Pt observed for 20 minutes post iron infusion. Pt tolerated treatment well without incident.  VSS at discharge.  Ambulatory to lobby.

## 2021-06-05 ENCOUNTER — Ambulatory Visit: Payer: BC Managed Care – PPO

## 2021-06-07 ENCOUNTER — Ambulatory Visit: Payer: BC Managed Care – PPO

## 2021-06-08 ENCOUNTER — Inpatient Hospital Stay: Payer: Medicare Other

## 2021-06-09 ENCOUNTER — Other Ambulatory Visit: Payer: Self-pay

## 2021-06-09 ENCOUNTER — Ambulatory Visit: Payer: Medicare Other

## 2021-06-09 ENCOUNTER — Inpatient Hospital Stay: Payer: Medicare Other | Attending: Nurse Practitioner

## 2021-06-09 VITALS — BP 137/79 | HR 80 | Temp 99.4°F | Resp 16

## 2021-06-09 DIAGNOSIS — Z79899 Other long term (current) drug therapy: Secondary | ICD-10-CM | POA: Insufficient documentation

## 2021-06-09 DIAGNOSIS — N920 Excessive and frequent menstruation with regular cycle: Secondary | ICD-10-CM | POA: Diagnosis not present

## 2021-06-09 DIAGNOSIS — D500 Iron deficiency anemia secondary to blood loss (chronic): Secondary | ICD-10-CM | POA: Insufficient documentation

## 2021-06-09 MED ORDER — SODIUM CHLORIDE 0.9 % IV SOLN
300.0000 mg | Freq: Once | INTRAVENOUS | Status: AC
  Administered 2021-06-09: 300 mg via INTRAVENOUS
  Filled 2021-06-09: qty 300

## 2021-06-09 MED ORDER — SODIUM CHLORIDE 0.9 % IV SOLN: Freq: Once | INTRAVENOUS | Status: AC

## 2021-06-09 NOTE — Patient Instructions (Signed)

## 2021-06-12 ENCOUNTER — Other Ambulatory Visit: Payer: Self-pay | Admitting: Nurse Practitioner

## 2021-06-12 ENCOUNTER — Encounter (HOSPITAL_COMMUNITY): Payer: Self-pay | Admitting: Psychiatry

## 2021-06-12 ENCOUNTER — Other Ambulatory Visit: Payer: Self-pay

## 2021-06-12 ENCOUNTER — Telehealth (HOSPITAL_BASED_OUTPATIENT_CLINIC_OR_DEPARTMENT_OTHER): Payer: Medicare Other | Admitting: Psychiatry

## 2021-06-12 DIAGNOSIS — Z131 Encounter for screening for diabetes mellitus: Secondary | ICD-10-CM

## 2021-06-12 DIAGNOSIS — Z7984 Long term (current) use of oral hypoglycemic drugs: Secondary | ICD-10-CM | POA: Diagnosis not present

## 2021-06-12 DIAGNOSIS — Z0000 Encounter for general adult medical examination without abnormal findings: Secondary | ICD-10-CM

## 2021-06-12 DIAGNOSIS — F419 Anxiety disorder, unspecified: Secondary | ICD-10-CM

## 2021-06-12 DIAGNOSIS — F3162 Bipolar disorder, current episode mixed, moderate: Secondary | ICD-10-CM | POA: Diagnosis not present

## 2021-06-12 DIAGNOSIS — N939 Abnormal uterine and vaginal bleeding, unspecified: Secondary | ICD-10-CM | POA: Diagnosis not present

## 2021-06-12 DIAGNOSIS — Z90721 Acquired absence of ovaries, unilateral: Secondary | ICD-10-CM | POA: Diagnosis not present

## 2021-06-12 DIAGNOSIS — D219 Benign neoplasm of connective and other soft tissue, unspecified: Secondary | ICD-10-CM | POA: Diagnosis not present

## 2021-06-12 DIAGNOSIS — D649 Anemia, unspecified: Secondary | ICD-10-CM | POA: Diagnosis not present

## 2021-06-12 DIAGNOSIS — Z8742 Personal history of other diseases of the female genital tract: Secondary | ICD-10-CM | POA: Diagnosis not present

## 2021-06-12 DIAGNOSIS — D259 Leiomyoma of uterus, unspecified: Secondary | ICD-10-CM | POA: Diagnosis not present

## 2021-06-12 DIAGNOSIS — E282 Polycystic ovarian syndrome: Secondary | ICD-10-CM | POA: Diagnosis not present

## 2021-06-12 MED ORDER — LITHIUM CARBONATE ER 300 MG PO TBCR: 300.0000 mg | EXTENDED_RELEASE_TABLET | Freq: Two times a day (BID) | ORAL | 1 refills | Status: DC

## 2021-06-12 MED ORDER — ARIPIPRAZOLE 15 MG PO TABS: 15.0000 mg | ORAL_TABLET | Freq: Every day | ORAL | 1 refills | Status: DC

## 2021-06-12 MED ORDER — CLONAZEPAM 0.5 MG PO TABS: 0.5000 mg | ORAL_TABLET | Freq: Every day | ORAL | 0 refills | Status: DC | PRN

## 2021-06-12 MED ORDER — LAMOTRIGINE 150 MG PO TABS: 150.0000 mg | ORAL_TABLET | Freq: Two times a day (BID) | ORAL | 1 refills | Status: DC

## 2021-06-12 NOTE — Progress Notes (Signed)
Virtual Visit via Telephone Note  I connected with Christine [name] on 06/12/21 at  9:20 AM EST by telephone and verified that I am speaking with the correct person using two identifiers.  Location: Patient: Home Provider: Home Office   I discussed the limitations, risks, security and privacy concerns of performing an evaluation and management service by telephone and the availability of in person appointments. I also discussed with the patient that there may be a patient responsible charge related to this service. The patient expressed understanding and agreed to proceed.   History of Present Illness: Patient is evaluated by phone session.  She apologized missing last appointment.  She has not done blood work which was recommended on the last visit.  She also not consistent with therapy with Romelle Starcher has actually left the office.  She has not able to schedule with a new therapist.  She has been out of lithium for past few weeks and admitted feeling more emotional, anxious, depressed and highs and lows.  Patient told too many things happen in past few months.  Her father died in April 20, 2023 who was living at [facility] and patient attended the funeral but got COVID and she needed to extend her stay.  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  She admitted job is stressful and she is worried about her job if she able to keep the job.  Patient has multiple health issues and doctors appointments and she gets iron infusion and not sure if she can keep the job.  She admitted not sleeping well despite using CPAP.  She lives with her husband.  She denies any suicidal thoughts or paranoia but admitted highs and lows, ruminative thoughts, decreased energy and motivation to do things.  She denies any hallucination, anger.  Her PHQ is 7.  She had blood work recently at  [facility] for iron infusion.  Her BUN 10 and creatinine 0.85.  Patient recall when we increased the lithium she felt better and noticed her mood was much better and is stable.  Her appetite is okay.  Her weight is unchanged from the past.  She has no tremor or shakes or any EPS.  Lately she is taking Klonopin more frequently because of anxiety and poor sleep.  She is compliant with Abilify and Lamictal.  She has no rash or any itching.    Past Psychiatric History: H/O depression since age 72.  Antidepressant made manic.  Saw Dr. [name] and tried Risperdal, Seroquel, Abilify, lithium, Prozac, Paxil, Zoloft, Lexapro, Celexa, Effexor and Klonopin.  H/O mania and impulsive behavior.  We tried [name]. H/O multiple inpatient, PHP and IOP. No h/o suicidal attempt.  Last inpatient in November 2017.     Recent Results (from the past 2160 hour(s))  Ferritin     Status: None   Collection Time: 05/21/21  9:16 AM  Result Value Ref Range   Ferritin 27 11 - 307 ng/mL    Comment: Performed at [facility], [address], [place], [place] [address]  Iron and TIBC     Status: Abnormal   Collection Time: 05/21/21  9:16 AM  Result Value Ref Range   Iron 40 (L) 41 - 142 ug/dL   TIBC 299 236 - 444 ug/dL   Saturation Ratios 13 (L) 21 - 57 %  UIBC 259 120 - 384 ug/dL    Comment: Performed at [facility], [place] [address] [place], [place] [address]  CMP ([place] only)     Status: Abnormal   Collection Time: 05/21/21  9:16 AM  Result Value Ref Range   Sodium 142 135 - 145 mmol/L   Potassium 3.6 3.5 - 5.1 mmol/L   Chloride 107 98 - 111 mmol/L   CO2 24 22 - 32 mmol/L   Glucose, Bld 95 70 - 99 mg/dL    Comment: Glucose reference range applies only to samples taken after fasting for at least 8 hours.   BUN 10 6 - 20 mg/dL   Creatinine 0.85 0.44 - 1.00 mg/dL   Calcium 9.6 8.9 - 10.3 mg/dL   Total Protein 7.3 6.5 - 8.1 g/dL   Albumin 4.0 3.5 -  5.0 g/dL   AST 9 (L) 15 - 41 U/L   ALT 15 0 - 44 U/L   Alkaline Phosphatase 60 38 - 126 U/L   Total Bilirubin 0.5 0.3 - 1.2 mg/dL   GFR, Estimated >60 >60 mL/min    Comment: (NOTE) Calculated using the CKD-EPI Creatinine Equation (2021)    Anion gap 11 5 - 15    Comment: Performed at [facility], [place] [address], [place], [place] [address]  CBC with Differential ([place] Only)     Status: Abnormal   Collection Time: 05/21/21  9:16 AM  Result Value Ref Range   WBC Count 11.0 (H) 4.0 - 10.5 K/uL   RBC 3.42 (L) 3.87 - 5.11 MIL/uL   Hemoglobin 10.5 (L) 12.0 - 15.0 g/dL   HCT 32.3 (L) 36.0 - 46.0 %   MCV 94.4 80.0 - 100.0 fL   MCH 30.7 26.0 - 34.0 pg   MCHC 32.5 30.0 - 36.0 g/dL   RDW 14.4 11.5 - 15.5 %   Platelet Count 253 150 - 400 K/uL   nRBC 0.2 0.0 - 0.2 %   Neutrophils Relative % 75 %   Neutro Abs 8.3 (H) 1.7 - 7.7 K/uL   Lymphocytes Relative 14 %   Lymphs Abs 1.6 0.7 - 4.0 K/uL   Monocytes Relative 8 %   Monocytes Absolute 0.9 0.1 - 1.0 K/uL   Eosinophils Relative 2 %   Eosinophils Absolute 0.2 0.0 - 0.5 K/uL   Basophils Relative 0 %   Basophils Absolute 0.0 0.0 - 0.1 K/uL   Immature Granulocytes 1 %   Abs Immature Granulocytes 0.09 (H) 0.00 - 0.07 K/uL    Comment: Performed at [facility], [place] [address], [place],  [address]     Psychiatric Specialty Exam: Physical Exam  Review of Systems  Weight (!) 356 lb (161.5 kg).There is no height or weight on file to calculate BMI.  General Appearance: NA  Eye Contact:  NA  Speech:  Slow  Volume:  Decreased  Mood:  Anxious and Depressed  Affect:  NA  Thought Process:  Descriptions of Associations: Intact  Orientation:  Full (Time, Place, and Person)  Thought Content:  Rumination  Suicidal Thoughts:  No  Homicidal Thoughts:  No  Memory:  Immediate;   Good Recent;   Fair Remote;   Fair  Judgement:  Fair  Insight:  Shallow  Psychomotor Activity:  NA   Concentration:  Concentration: Fair and Attention Span: Fair  Recall:  [employer] of Knowledge:  Fair  Language:  Good  Akathisia:  No  Handed:  Right  AIMS (if indicated):     Assets:  Communication Skills Desire for Improvement Housing Transportation  ADL's:  Intact  Cognition:  WNL  Sleep:   4 hrs with CPAP      Assessment and Plan: Bipolar disorder type I.  Anxiety.  Discussed slowly decompensating her symptoms as patient not consistent with medication and not taking lithium.  Patient promised that she will restart the lithium and get the blood work.  I also encouraged she should restart therapy and keep the appointment since it is virtual.  We talk about her new job and she like to give more time as overall she feels good about the job.  We will restart lithium 300 mg twice a day, continue Abilify 15 mg daily, Lamictal 300 mg daily and continue Klonopin 0.5 mg to take as needed for severe anxiety.  I will also forward my note to her provider at [facility] for blood work for lithium and hemoglobin A1c.  Discussed safety concerns and anytime having active suicidal thoughts or homicidal [place] need to call 911 or go to local emergency room.  Follow-up in 6 to 8 weeks.  Follow Up Instructions:    I discussed the assessment and treatment plan with the patient. The patient was provided an opportunity to ask questions and all were answered. The patient agreed with the plan and demonstrated an understanding of the instructions.   The patient was advised to call back or seek an in-person evaluation if the symptoms worsen or if the condition fails to improve as anticipated.  I provided 36 minutes of non-face-to-face time during this encounter.   Kathlee Nations, MD

## 2021-06-13 ENCOUNTER — Other Ambulatory Visit: Payer: Self-pay

## 2021-06-15 ENCOUNTER — Other Ambulatory Visit: Payer: Self-pay

## 2021-06-15 ENCOUNTER — Ambulatory Visit (INDEPENDENT_AMBULATORY_CARE_PROVIDER_SITE_OTHER): Payer: Medicare Other | Admitting: Clinical

## 2021-06-15 DIAGNOSIS — F3162 Bipolar disorder, current episode mixed, moderate: Secondary | ICD-10-CM | POA: Diagnosis not present

## 2021-06-15 DIAGNOSIS — F419 Anxiety disorder, unspecified: Secondary | ICD-10-CM | POA: Diagnosis not present

## 2021-06-15 DIAGNOSIS — N939 Abnormal uterine and vaginal bleeding, unspecified: Secondary | ICD-10-CM | POA: Insufficient documentation

## 2021-06-15 NOTE — Plan of Care (Signed)
Pt will learn appropriate ways to manage impulsive behavior. Pt will develop healthy coping methods to manage mood changes as evidenced by developing time management skills(scheduling "me time" 3 out of 7 days per week for at least 15 minutes); Journaling(3 out of 7 days per week). Pt participated in completion of treatment plan.
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
## 2021-06-22 ENCOUNTER — Ambulatory Visit (INDEPENDENT_AMBULATORY_CARE_PROVIDER_SITE_OTHER): Payer: Medicare Other | Admitting: Clinical

## 2021-06-22 ENCOUNTER — Other Ambulatory Visit: Payer: Self-pay

## 2021-06-22 DIAGNOSIS — F3162 Bipolar disorder, current episode mixed, moderate: Secondary | ICD-10-CM | POA: Diagnosis not present

## 2021-06-22 DIAGNOSIS — F419 Anxiety disorder, unspecified: Secondary | ICD-10-CM | POA: Diagnosis not present

## 2021-06-22 NOTE — Progress Notes (Signed)
   THERAPIST PROGRESS NOTE  Session Time: 8am  Participation Level: Active  Behavioral Response: Casual and NeatAlertAnxious and Depressed  Type of Therapy: Individual Therapy  Treatment Goals addressed: Anxiety and Coping  Interventions: Supportive Virtual Visit via Video Note  I connected with Christine [name] on 06/22/21 at  8:00 AM EST by a video enabled telemedicine application and verified that I am speaking with the correct person using two identifiers.  Location: Patient: home Provider: office   I discussed the limitations of evaluation and management by telemedicine and the availability of in person appointments. The patient expressed understanding and agreed to proceed.   I discussed the assessment and treatment plan with the patient. The patient was provided an opportunity to ask questions and all were answered. The patient agreed with the plan and demonstrated an understanding of the instructions.   The patient was advised to call back or seek an in-person evaluation if the symptoms worsen or if the condition fails to improve as anticipated.  I provided 60 minutes of non-face-to-face time during this encounter.  Summary: Pt describes her mood as "really down". Pt tearful during session as she discussed challenges she has been experiencing. 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 Pt states she is afraid she will not be able to maintain her job due to her mental health challenges. Pt reports she has been experiencing mood swings, difficulty concentrating, crying spells, irritability and self doubt. Pt discussed her tendency to compare herself to others and reports her self worth is attached to her ability to be productive. Pt reports it is taking her longer to get comfortable with new assessment tool at work and questions why others are able to catch on. Pt  reports her goal is to no longer receive disability and work full time with no limitations.  Suicidal/Homicidal: Pt denies SI/HI no AH/VH reported. No plan, intent or attempt to harm self or others reported.  Therapist Response: CSW discussed with pt cognitive distortions and putting thoughts on trial to challenge irrational beliefs. CSW further discussed cognitive triangle model and how thoughts, feelings and behaviors affect one another. CSW processed with pt becoming intentional with implementing self care routine and ways to do so such as setting reminders and alarms on phone, written reminders on calendars.  Plan: Return again in 2 weeks.  Diagnosis: Axis I: Bipolar I disorder, mixed, moderate    Anxiety    Axis II: No diagnosis    Yvette Rack, LCSW 06/22/2021

## 2021-06-24 DIAGNOSIS — Z23 Encounter for immunization: Secondary | ICD-10-CM | POA: Diagnosis not present

## 2021-07-02 ENCOUNTER — Other Ambulatory Visit: Payer: Self-pay

## 2021-07-02 ENCOUNTER — Ambulatory Visit (INDEPENDENT_AMBULATORY_CARE_PROVIDER_SITE_OTHER): Payer: Medicare Other | Admitting: Clinical

## 2021-07-02 ENCOUNTER — Telehealth (HOSPITAL_COMMUNITY): Payer: Medicare Other | Admitting: Psychiatry

## 2021-07-02 DIAGNOSIS — F419 Anxiety disorder, unspecified: Secondary | ICD-10-CM

## 2021-07-02 DIAGNOSIS — F3162 Bipolar disorder, current episode mixed, moderate: Secondary | ICD-10-CM | POA: Diagnosis not present

## 2021-07-02 NOTE — Progress Notes (Signed)
   THERAPIST PROGRESS NOTE  Session Time: 8am  Participation Level: Active  Behavioral Response: Casual and NeatAlerttearful  Type of Therapy: Individual Therapy  Treatment Goals addressed: Coping  Interventions: CBT Virtual Visit via Video Note  I connected with Maia Breslow on 07/02/21 at  8:00 AM EST by a video enabled telemedicine application and verified that I am speaking with the correct person using two identifiers.  Location: Patient: home Provider: office   I discussed the limitations of evaluation and management by telemedicine and the availability of in person appointments. The patient expressed understanding and agreed to proceed.   I discussed the assessment and treatment plan with the patient. The patient was provided an opportunity to ask questions and all were answered. The patient agreed with the plan and demonstrated an understanding of the instructions.   The patient was advised to call back or seek an in-person evaluation if the symptoms worsen or if the condition fails to improve as anticipated.  I provided 54 minutes of non-face-to-face time during this encounter.  Summary: Pt reports spending time with family during the holiday. Pt says she experiences periods of sadness as she grieves the loss of her parents. Pt reports her mother passed away three years ago. 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 Pt discussed challenges with negative thinking as it relates to self doubt about balancing full time employment and school. According to pt, in the past she has had challenges with work life balance and is fearful she will continue to have the same outcome.  Suicidal/Homicidal:Pt denies SI/HI no AH/VH reported. No plan, intent or attempt to harm self or others reported.  Therapist Response: CSW processed with pt harmful consequences of cognitive distortions. CSW  actively listened as pt discussed having black and white thinking and challenges this has caused her. CSW discussed with pt challenging irrational beliefs about herself. CSW discussed with pt about what protective factors are and assisted her in identifying some of her protective factors.  Plan: Return again in 2 weeks.  Diagnosis: Axis I: Bipolar I disorder, mixed, moderate                                     Anxiety    Axis II: No diagnosis    Yvette Rack, LCSW 07/02/2021

## 2021-07-13 ENCOUNTER — Other Ambulatory Visit: Payer: Self-pay

## 2021-07-13 ENCOUNTER — Ambulatory Visit (INDEPENDENT_AMBULATORY_CARE_PROVIDER_SITE_OTHER): Payer: Medicare Other | Admitting: Clinical

## 2021-07-13 DIAGNOSIS — F3162 Bipolar disorder, current episode mixed, moderate: Secondary | ICD-10-CM

## 2021-07-13 DIAGNOSIS — F419 Anxiety disorder, unspecified: Secondary | ICD-10-CM

## 2021-07-13 NOTE — Progress Notes (Signed)
   THERAPIST PROGRESS NOTE  Session Time: 8am  Participation Level: Active  Behavioral Response: CasualAlerttearful  Type of Therapy: Individual Therapy  Treatment Goals addressed: Coping  Interventions: CBT Virtual Visit via Video Note  I connected with Christine [name] on 07/13/21 at  8:00 AM EST by a video enabled telemedicine application and verified that I am speaking with the correct person using two identifiers.  Location: Patient: home Provider: office   I discussed the limitations of evaluation and management by telemedicine and the availability of in person appointments. The patient expressed understanding and agreed to proceed.   I discussed the assessment and treatment plan with the patient. The patient was provided an opportunity to ask questions and all were answered. The patient agreed with the plan and demonstrated an understanding of the instructions.   The patient was advised to call back or seek an in-person evaluation if the symptoms worsen or if the condition fails to improve as anticipated.  I provided 50 minutes of non-face-to-face time during this encounter.  Summary: Per pt report she has been speaking with her husband about them creating a self care routine. Pt says the routine will consists of them preparing meals at home, eating out less, exercising. Pt states this has been a challenging week for her. She reports missing two days of work due to low mood. Pt endorses sadness, negative thinking, tearfulness, SI(thoughts of running car into a tree)  Pt discussed concerns and challenges working full time and toll it has held on there mental and emotional health. Pt reports her daily functioning is impacted. Pt states she did not act on SI because she does not want to leave her children. Pt says she contacted her husband and friends and they comforted her during her time of need. Pt identifies hormonal changes during menstrual cycle as the trigger. According to pt  these mood changes occur often during her menstrual cycle. Pt reports she will contact physician to discuss concerns.  Suicidal/Homicidal:Pt denies SI/HI no AH/VH reported. No plan, intent or attempt to harm self or others reported.  Therapist Response: Todays session consisted of discussing importance of self compassion during distressing times. Processed with pt challenging irrational beliefs about self. Assisted pt in identifying things in and out of her control and setting realistic expectations of herself. Discussed pros and cons of full time employment. Actively listened as pt discussed having other source of income and support of her husband should she choose to work part time.  Plan: Return again in 1 weeks.  Diagnosis: Axis I: Bipolar I disorder, mixed, moderate                                     Anxiety    Axis II: No diagnosis    Yvette Rack, LCSW 07/13/2021

## 2021-07-16 ENCOUNTER — Telehealth (HOSPITAL_COMMUNITY): Payer: Medicare Other | Admitting: Psychiatry

## 2021-07-20 ENCOUNTER — Other Ambulatory Visit: Payer: Self-pay

## 2021-07-20 ENCOUNTER — Ambulatory Visit (INDEPENDENT_AMBULATORY_CARE_PROVIDER_SITE_OTHER): Payer: Medicare Other | Admitting: Clinical

## 2021-07-20 DIAGNOSIS — F419 Anxiety disorder, unspecified: Secondary | ICD-10-CM

## 2021-07-20 DIAGNOSIS — F3162 Bipolar disorder, current episode mixed, moderate: Secondary | ICD-10-CM

## 2021-07-20 NOTE — Progress Notes (Signed)
° °  THERAPIST PROGRESS NOTE  Session Time: 8am  Participation Level: Active  Behavioral Response: CasualAlertpleasant  Type of Therapy: Individual Therapy  Treatment Goals addressed: Coping  Interventions: CBT Virtual Visit via Video Note  I connected with Christine [name] on 07/20/21 at  8:00 AM EST by a video enabled telemedicine application and verified that I am speaking with the correct person using two identifiers.  Location: Patient: home Provider: office   I discussed the limitations of evaluation and management by telemedicine and the availability of in person appointments. The patient expressed understanding and agreed to proceed.   I discussed the assessment and treatment plan with the patient. The patient was provided an opportunity to ask questions and all were answered. The patient agreed with the plan and demonstrated an understanding of the instructions.   The patient was advised to call back or seek an in-person evaluation if the symptoms worsen or if the condition fails to improve as anticipated.  I provided 60 minutes of non-face-to-face time during this encounter.  Summary: Pt says she has had days where she has experienced low mood but is working to create a self care routine to improve her overall health and wellness. Pt states she found an Theatre manager and plans to purchase materials to begin knitting again. Pt says knitting and baking are hobbies she enjoys but has not done consistently due to low mood. Additionally pt reports finding a free meditation app and a cbt group on facebook that provides structured reminders to perform therapeutic activities.  Suicidal/Homicidal: Pt denies SI/HI no AH/VH reported. No plan, intent or attempt to harm self or others reported.  Therapist Response: Processed with pt putting thoughts on trial to challenge cognitive distortion, all or nothing thinking.  Actively listened as pt discussed  provided examples of all or  noting thinking; feeling like a "failure and self doubt" when comparing her career and successes to peers she went to school with. Modeled for pt reframing negative thinking and assessed understanding of skill. Discussed with pt focusing attention on moment of gratitude and identifying each day things she is thankful for.  Plan: Return again in 2 weeks.  Diagnosis: Axis I:  Bipolar I disorder, mixed, moderate                                     Anxiety   Axis II: No diagnosis    Yvette Rack, LCSW 07/20/2021

## 2021-08-03 ENCOUNTER — Other Ambulatory Visit: Payer: Self-pay | Admitting: *Deleted

## 2021-08-03 DIAGNOSIS — D500 Iron deficiency anemia secondary to blood loss (chronic): Secondary | ICD-10-CM

## 2021-08-05 ENCOUNTER — Encounter: Payer: Self-pay | Admitting: Hematology

## 2021-08-05 NOTE — Progress Notes (Deleted)
[facility]   [phone] [phone]   Clinic Follow up Note   Patient Care Team: Scifres, Durel Salts as PCP - General (Physician Assistant) [name], MD as PCP - Cardiology (Cardiology) 08/05/2021  CHIEF COMPLAINT: Follow up IDA  CURRENT THERAPY: IV Feraheme PRN (02/2021), changed to IV Venofer, last given 06/2021   INTERVAL HISTORY: [name] returns for lab and follow up as scheduled. Last seen 12/2020. She received IV iron last in 06/2021.    REVIEW OF SYSTEMS:   Constitutional: Denies fevers, chills or abnormal weight loss Eyes: Denies blurriness of vision Ears, nose, mouth, throat, and face: Denies mucositis or sore throat Respiratory: Denies cough, dyspnea or wheezes Cardiovascular: Denies palpitation, chest discomfort or lower extremity swelling Gastrointestinal:  Denies nausea, heartburn or change in bowel habits Skin: Denies abnormal skin rashes Lymphatics: Denies new lymphadenopathy or easy bruising Neurological:Denies numbness, tingling or new weaknesses Behavioral/Psych: Mood is stable, no new changes  All other systems were reviewed with the patient and are negative.  MEDICAL HISTORY:  Past Medical History:  Diagnosis Date   Anemia    Presumably from menorrhagia/menomenorrhagia; has had both blood and iron transfusions   Anxiety    Back pain    Bipolar 1 disorder ([place])    With depression   Depression    Essential hypertension    Fibromyalgia    Headache    History of blood transfusion 08/2017   [facility]   History of hiatal hernia    Infertility, female    Lactose intolerance    Lower extremity edema    Migraines    Morbid obesity (HCC)    Osteoarthritis    Hands, ankles; HLA-B27 positive   PCOS (polycystic ovarian syndrome)    Seasonal allergies    With recurrent allergic rhinitis   Uterine leiomyoma    Vitamin D deficiency     SURGICAL HISTORY: Past Surgical History:  Procedure Laterality Date    CARDIAC CT ANGIOGRAM  03/2019    Ca Score 0. Normal Coronary origin R dominant. No evidence of CAD. ? Liver nodules - poorly visualized (consider MRI w & w/o Gad contrast)   CERVICAL POLYPECTOMY  01/15/2018   Procedure: CERVICAL POLYPECTOMY;  Surgeon: Arvella Nigh, MD;  Location: [facility];  Service: Gynecology;;   COLONOSCOPY  2008   Normal   DIAGNOSTIC LAPAROSCOPY  08/1999   dermoid cyst, RSO   DILATION AND CURETTAGE OF UTERUS  05/2004   MAB   DILATION AND CURETTAGE OF UTERUS  04/2015   HYSTEROSCOPY WITH D & C  07/22/2011   Procedure: DILATATION AND CURETTAGE /HYSTEROSCOPY;  Surgeon: Cyril Mourning, MD;  Location: [facility];  Service: Gynecology;;   HYSTEROSCOPY WITH D & C N/A 01/15/2018   Procedure: DILATATION AND CURETTAGE [place] WITH MYOSURE;  Surgeon: Arvella Nigh, MD;  Location: [facility];  Service: Gynecology;  Laterality: N/A;   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  Suggestion of high cardiac output (possibly related to anemia thyrotoxicosis, Pregnancy, sepsis etc.) -- was in setting of symptomatic anemia - Hgb 5.7    I have reviewed the social history and family history with the patient and they are unchanged from previous note.  ALLERGIES:  is allergic to penicillins.  MEDICATIONS:  Current Outpatient Medications  Medication Sig Dispense Refill   acetaminophen (TYLENOL) 325 MG tablet Take 650 mg by mouth every  6 (six) hours as needed for mild pain or headache.     amLODipine (NORVASC) 5 MG tablet Take 1 tablet (5 mg total) by mouth daily. 30 tablet 0   ARIPiprazole (ABILIFY) 15 MG tablet Take 1 tablet (15 mg total) by mouth daily. 30 tablet 1   chlorthalidone (HYGROTON) 25 MG tablet Take 0.5 tablets (12.5 mg total) by mouth daily. 60 tablet 2   clonazePAM (KLONOPIN) 0.5 MG tablet Take 1 tablet (0.5 mg total) by mouth daily as needed for anxiety. 30 tablet 0   fluticasone (FLONASE) 50  MCG/ACT nasal spray 1 spray in each nostril     lamoTRIgine (LAMICTAL) 150 MG tablet Take 1 tablet (150 mg total) by mouth 2 (two) times daily. For mood 60 tablet 1   lithium carbonate (LITHOBID) 300 MG CR tablet Take 1 tablet (300 mg total) by mouth 2 (two) times daily. 60 tablet 1   metFORMIN (GLUCOPHAGE) 500 MG tablet Take 1 tablet (500 mg total) by mouth 2 (two) times daily with a meal. 60 tablet 0   metoprolol succinate (TOPROL-XL) 100 MG 24 hr tablet TAKE 1 TABLET BY MOUTH DAILY WITH OR IMMEDIATELY FOLLOWING A MEAL 90 tablet 3   No current facility-administered medications for this visit.    PHYSICAL EXAMINATION: ECOG PERFORMANCE STATUS: {CHL ONC ECOG [phone]}  There were no vitals filed for this visit. There were no vitals filed for this visit.  GENERAL:alert, no distress and comfortable SKIN: skin color, texture, turgor are normal, no rashes or significant lesions EYES: normal, Conjunctiva are pink and non-injected, sclera clear OROPHARYNX:no exudate, no erythema and lips, buccal mucosa, and tongue normal  NECK: supple, thyroid normal size, non-tender, without nodularity LYMPH:  no palpable lymphadenopathy in the cervical, axillary or inguinal LUNGS: clear to auscultation and percussion with normal breathing effort HEART: regular rate & rhythm and no murmurs and no lower extremity edema ABDOMEN:abdomen soft, non-tender and normal bowel sounds Musculoskeletal:no cyanosis of digits and no clubbing  NEURO: alert & oriented x 3 with fluent speech, no focal motor/sensory deficits  LABORATORY DATA:  I have reviewed the data as listed CBC Latest Ref Rng & Units 05/21/2021 02/26/2021 01/19/2021  WBC 4.0 - 10.5 K/uL 11.0(H) 8.4 8.5  Hemoglobin 12.0 - 15.0 g/dL 10.5(L) 11.7(L) 12.4  Hematocrit 36.0 - 46.0 % 32.3(L) 36.0 38.1  Platelets 150 - 400 K/uL 253 288 306     CMP Latest Ref Rng & Units 05/21/2021 01/19/2021 11/01/2020  Glucose 70 - 99 mg/dL 95 107(H) 135(H)  BUN 6 - 20  mg/dL _0 Creatinine 0.44 - 1.00 mg/dL 0.85 0.99 0.89  Sodium 135 - 145 mmol/L 142 137 138  Potassium 3.5 - 5.1 mmol/L 3.6 3.5 3.7  Chloride 98 - 111 mmol/L 107 108 108  CO2 22 - 32 mmol/L 24 21(L) 22  Calcium 8.9 - 10.3 mg/dL 9.6 9.6 8.9  Total Protein 6.5 - 8.1 g/dL 7.3 7.7 7.4  Total Bilirubin 0.3 - 1.2 mg/dL 0.5 0.7 0.5  Alkaline Phos 38 - 126 U/L 60 55 69  AST 15 - 41 U/L 9(L) 13(L) 8(L)  ALT 0 - 44 U/L _1 RADIOGRAPHIC STUDIES: I have personally reviewed the radiological images as listed and agreed with the findings in the report. No results found.   ASSESSMENT & PLAN:  No problem-specific Assessment & Plan notes found for this encounter.   No orders of the defined types were placed in this encounter.  All questions were answered. The patient knows to call the clinic with any problems, questions or concerns. No barriers to learning was detected. I spent {CHL ONC TIME VISIT - GBTDV:[id number]} counseling the patient face to face. The total time spent in the appointment was {CHL ONC TIME VISIT - GYIRS:[id number]} and more than 50% was on counseling and review of test results     Alla Feeling, NP 08/05/21

## 2021-08-07 ENCOUNTER — Other Ambulatory Visit: Payer: Self-pay

## 2021-08-07 ENCOUNTER — Ambulatory Visit (HOSPITAL_COMMUNITY): Payer: BC Managed Care – PPO | Admitting: Clinical

## 2021-08-07 ENCOUNTER — Inpatient Hospital Stay: Payer: Medicare PPO | Admitting: Nurse Practitioner

## 2021-08-07 ENCOUNTER — Inpatient Hospital Stay: Payer: Medicare PPO | Attending: Nurse Practitioner

## 2021-08-07 DIAGNOSIS — D500 Iron deficiency anemia secondary to blood loss (chronic): Secondary | ICD-10-CM

## 2021-08-09 ENCOUNTER — Ambulatory Visit: Payer: BC Managed Care – PPO | Admitting: Nurse Practitioner

## 2021-08-09 ENCOUNTER — Other Ambulatory Visit: Payer: BC Managed Care – PPO

## 2021-08-13 ENCOUNTER — Encounter (HOSPITAL_COMMUNITY): Payer: Self-pay | Admitting: Psychiatry

## 2021-08-13 ENCOUNTER — Other Ambulatory Visit: Payer: Self-pay

## 2021-08-13 ENCOUNTER — Telehealth (HOSPITAL_BASED_OUTPATIENT_CLINIC_OR_DEPARTMENT_OTHER): Payer: Medicare PPO | Admitting: Psychiatry

## 2021-08-13 VITALS — Wt 350.0 lb

## 2021-08-13 DIAGNOSIS — F3162 Bipolar disorder, current episode mixed, moderate: Secondary | ICD-10-CM

## 2021-08-13 DIAGNOSIS — F419 Anxiety disorder, unspecified: Secondary | ICD-10-CM | POA: Diagnosis not present

## 2021-08-13 MED ORDER — CLONAZEPAM 0.5 MG PO TABS: 0.5000 mg | ORAL_TABLET | Freq: Every day | ORAL | 0 refills | Status: AC | PRN

## 2021-08-13 MED ORDER — LAMOTRIGINE 150 MG PO TABS: 150.0000 mg | ORAL_TABLET | Freq: Two times a day (BID) | ORAL | 2 refills | Status: DC

## 2021-08-13 MED ORDER — ARIPIPRAZOLE 15 MG PO TABS: 15.0000 mg | ORAL_TABLET | Freq: Every day | ORAL | 2 refills | Status: DC

## 2021-08-13 MED ORDER — LITHIUM CARBONATE ER 300 MG PO TBCR: 300.0000 mg | EXTENDED_RELEASE_TABLET | Freq: Two times a day (BID) | ORAL | 2 refills | Status: DC

## 2021-08-13 NOTE — Progress Notes (Signed)
Virtual Visit via Telephone Note  I connected with [name] on 08/13/21 at  2:00 PM EST by telephone and verified that I am speaking with the correct person using two identifiers.  Location: Patient: Home Provider: Home Office   I discussed the limitations, risks, security and privacy concerns of performing an evaluation and management service by telephone and the availability of in person appointments. I also discussed with the patient that there may be a patient responsible charge related to this service. The patient expressed understanding and agreed to proceed.   History of Present Illness: Patient is evaluated by phone session.  She is now taking regularly her lithium, Lamictal, Abilify.  She also takes Klonopin when she was very nervous and anxious.  She admitted last month she was very stressed out and overwhelmed.  She is not sure what triggered but believes increased work and having symptoms before her monthly cycle may have contributed to her anxiety and depression.  She is not sure how long she will work as she may stopped working in February.  She feels working is making her overwhelmed.  This is her third job and she does not want to get sick.  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  She is using CPAP and is helping her sleep.  She started therapy with Sanjuana Kava.  Patient told that was a first Christmas after her mother died in March 30, 2023.  She was sad but also she was overwhelmed because there were no water in the house.  They were not able to go anywhere but now she is feeling much better.  She apologized not having Blood work but promised to have it done very soon.  She denies any feeling of hopelessness, anhedonia, crying spells or any suicidal thoughts.  She is getting infusion for chronic anemia.  She wants to keep the current medication.   Past Psychiatric  History: H/O depression since age 38.  Antidepressant made manic.  Saw Dr. [name] and tried Risperdal, Seroquel, Abilify, lithium, Prozac, Paxil, Zoloft, Lexapro, Celexa, Effexor and Klonopin.  H/O mania and impulsive behavior.  We tried [name]. H/O multiple inpatient, PHP and IOP. No h/o suicidal attempt.  Last inpatient in November 2017.      Psychiatric Specialty Exam: Physical Exam  Review of Systems  Weight (!) 350 lb (158.8 kg).There is no height or weight on file to calculate BMI.  General Appearance: NA  Eye Contact:  NA  Speech:  Slow  Volume:  Normal  Mood:  Dysphoric  Affect:  NA  Thought Process:  Goal Directed  Orientation:  Full (Time, Place, and Person)  Thought Content:  Rumination  Suicidal Thoughts:  No  Homicidal Thoughts:  No  Memory:  Immediate;   Fair Recent;   Fair Remote;   Fair  Judgement:  Intact  Insight:  Fair  Psychomotor Activity:  NA  Concentration:  Concentration: Fair and Attention Span: Fair  Recall:  [employer] of Knowledge:  Fair  Language:  Good  Akathisia:  No  Handed:  Right  AIMS (if indicated):     Assets:  Communication Skills Desire for Improvement Housing Social Support  ADL's:  Intact  Cognition:  WNL  Sleep:   ok with CPAP      Assessment and Plan: Bipolar disorder type I.  Anxiety.  Patient doing better since taking the Lamictal  and lithium regularly.  She denies any highs and lows mood swing, mania.  I encouraged to continue therapy with Sanjuana Kava.  Continue lithium 300 mg twice a day, Abilify 15 mg daily, Lamictal 300 mg daily.  She has no rash, itching.  She takes Klonopin only as needed.  Reminded blood work for lithium and hemoglobin A1c.  Recommended to call us back if she is any question of any concern.  Follow-up in 3 months.  Follow Up Instructions:    I discussed the assessment and treatment plan with the patient. The patient was provided an opportunity to ask questions and all were answered.  The patient agreed with the plan and demonstrated an understanding of the instructions.   The patient was advised to call back or seek an in-person evaluation if the symptoms worsen or if the condition fails to improve as anticipated.  I provided 17 minutes of non-face-to-face time during this encounter.   Kathlee Nations, MD

## 2021-10-05 ENCOUNTER — Other Ambulatory Visit: Payer: Self-pay

## 2021-10-05 ENCOUNTER — Ambulatory Visit (INDEPENDENT_AMBULATORY_CARE_PROVIDER_SITE_OTHER): Payer: Medicare PPO | Admitting: Clinical

## 2021-10-05 DIAGNOSIS — F3162 Bipolar disorder, current episode mixed, moderate: Secondary | ICD-10-CM | POA: Diagnosis not present

## 2021-10-05 DIAGNOSIS — F419 Anxiety disorder, unspecified: Secondary | ICD-10-CM

## 2021-10-05 NOTE — Progress Notes (Signed)
? ?  THERAPIST PROGRESS NOTE ? ?Session Time: 8am ? ?Participation Level: Active ? ?Behavioral Response: CasualAlertpleasant ? ?Type of Therapy: Individual Therapy ? ?Treatment Goals addressed: Pt will develop healthy coping methods to manage mood changes as evidenced by developing time management skills(scheduling "me time" 3 out of 7 days per week for at least 15 minutes); Journaling(3 out of 7 days per week). ? ?ProgressTowards Goals: Minimal ? ?Interventions: Supportive ?Virtual Visit via Video Note ? ?I connected with [name] on 10/05/21 at  8:00 AM EST by a video enabled telemedicine application and verified that I am speaking with the correct person using two identifiers. ? ?Location: ?Patient: home ?Provider: office ?  ?I discussed the limitations of evaluation and management by telemedicine and the availability of in person appointments. The patient expressed understanding and agreed to proceed. ?  ?I discussed the assessment and treatment plan with the patient. The patient was provided an opportunity to ask questions and all were answered. The patient agreed with the plan and demonstrated an understanding of the instructions. ?  ?The patient was advised to call back or seek an in-person evaluation if the symptoms worsen or if the condition fails to improve as anticipated. ? ?I provided 55 minutes of non-face-to-face time during this encounter.  ?Summary: Last session 07/20/2021. Pt says she has reduced her work hours and will be working 10 hrs per week. Pt states it was challenging for her working full time and states it was taking a toll on her mental and emotional health. 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 Pt discussed difficulty with time management and poor follow through on task. Pt reports she is planning to work on creating a daily schedule to help with completing task. ? ?Suicidal/Homicidal: Pt denies SI/HI no AH/VH reported. No  plan, intent or attempt to harm self or others reported. ? ?Therapist Response: Session consisted of discussing time management tips. Processed with pt ways to avoid distractions;breaking task into smaller pieces; limiting distractions; prioritizing task(urgent, emergent, routine). Further processed with pt changing environment(going to coffee shop without at TV, book store, [employer]) to address social isolation. ? ?Plan: Return again in 2 weeks. ? ?Diagnosis: Bipolar I disorder, mixed, moderate ?                                    Anxiety ? ?Collaboration of Care: Other none requested ? ?Patient/Guardian was advised Release of Information must be obtained prior to any record release in order to collaborate their care with an outside provider. Patient/Guardian was advised if they have not already done so to contact the registration department to sign all necessary forms in order for [place] to release information regarding their care.  ? ?Consent: Patient/Guardian gives verbal consent for treatment and assignment of benefits for services provided during this visit. Patient/Guardian expressed understanding and agreed to proceed.  ? ?Isaic Syler Lynelle Smoke, LCSW ?10/05/2021 ? ?

## 2021-11-01 ENCOUNTER — Ambulatory Visit (HOSPITAL_COMMUNITY): Payer: Medicare PPO | Admitting: Clinical

## 2021-11-07 ENCOUNTER — Other Ambulatory Visit: Payer: Self-pay | Admitting: Cardiology

## 2021-11-12 ENCOUNTER — Telehealth (HOSPITAL_COMMUNITY): Payer: Medicare PPO | Admitting: Psychiatry

## 2021-11-15 ENCOUNTER — Ambulatory Visit (INDEPENDENT_AMBULATORY_CARE_PROVIDER_SITE_OTHER): Payer: Medicare PPO | Admitting: Clinical

## 2021-11-15 DIAGNOSIS — F419 Anxiety disorder, unspecified: Secondary | ICD-10-CM | POA: Diagnosis not present

## 2021-11-15 DIAGNOSIS — F3162 Bipolar disorder, current episode mixed, moderate: Secondary | ICD-10-CM

## 2021-11-15 NOTE — Progress Notes (Signed)
? ?  THERAPIST PROGRESS NOTE ? ?Session Time: 11am ? ?Participation Level: Active ? ?Behavioral Response: CasualAlertpleasant ? ?Type of Therapy: Individual Therapy ? ?ProgressTowards Goals: Progressing ? ?Interventions: Supportive ?Virtual Visit via Video Note ? ?I connected with Christine [name] on 11/15/21 at 11:00 AM EDT by a video enabled telemedicine application and verified that I am speaking with the correct person using two identifiers. ? ?Location: ?Patient: home ?Provider: office ?  ?I discussed the limitations of evaluation and management by telemedicine and the availability of in person appointments. The patient expressed understanding and agreed to proceed. ?  ?I discussed the assessment and treatment plan with the patient. The patient was provided an opportunity to ask questions and all were answered. The patient agreed with the plan and demonstrated an understanding of the instructions. ?  ?The patient was advised to call back or seek an in-person evaluation if the symptoms worsen or if the condition fails to improve as anticipated. ? ?I provided 45 minutes of non-face-to-face time during this encounter.  ?Summary: Probation officer informed pt last day of employment on 4/21; discussed referral process. Producer, television/film/video pt list of community mental health resources, encouraged her to check out psychology today website for providers and contact insurance provider for additional resources. Pt says working a reduced work schedule is helping with stress management and allows her more flexibility throughout her day. No SI/HI reported. No psychotic sxs reported. ? ?Diagnosis: Bipolar I disorder, mixed, moderate ?                                    Anxiety ? ?Collaboration of Care: Other referrals for community mental health providers provided by Probation officer. ? ?Patient/Guardian was advised Release of Information must be obtained prior to any record release in order to collaborate their care with an outside provider.  Patient/Guardian was advised if they have not already done so to contact the registration department to sign all necessary forms in order for [place] to release information regarding their care.  ? ?Consent: Patient/Guardian gives verbal consent for treatment and assignment of benefits for services provided during this visit. Patient/Guardian expressed understanding and agreed to proceed.  ? ?Kailee Essman Lynelle Smoke, LCSW ?11/15/2021 ? ?

## 2021-11-27 ENCOUNTER — Telehealth (HOSPITAL_COMMUNITY): Payer: Medicare PPO | Admitting: Psychiatry

## 2021-11-29 ENCOUNTER — Ambulatory Visit (HOSPITAL_COMMUNITY): Payer: Medicare PPO | Admitting: Clinical

## 2022-02-13 ENCOUNTER — Other Ambulatory Visit: Payer: Self-pay | Admitting: Adult Health

## 2022-02-13 ENCOUNTER — Other Ambulatory Visit: Payer: Self-pay | Admitting: Cardiology

## 2022-03-13 ENCOUNTER — Encounter (INDEPENDENT_AMBULATORY_CARE_PROVIDER_SITE_OTHER): Payer: Self-pay

## 2022-05-02 ENCOUNTER — Encounter: Payer: Self-pay | Admitting: Hematology

## 2022-05-02 ENCOUNTER — Emergency Department (HOSPITAL_COMMUNITY)
Admission: EM | Admit: 2022-05-02 | Discharge: 2022-05-02 | Disposition: A | Discharge status: Home / Self Care | Payer: Medicare Other | Attending: Emergency Medicine | Admitting: Emergency Medicine

## 2022-05-02 ENCOUNTER — Emergency Department (HOSPITAL_COMMUNITY): Payer: Medicare Other

## 2022-05-02 ENCOUNTER — Encounter (HOSPITAL_COMMUNITY): Payer: Self-pay

## 2022-05-02 DIAGNOSIS — R7303 Prediabetes: Secondary | ICD-10-CM | POA: Insufficient documentation

## 2022-05-02 DIAGNOSIS — R0602 Shortness of breath: Secondary | ICD-10-CM | POA: Diagnosis not present

## 2022-05-02 DIAGNOSIS — D509 Iron deficiency anemia, unspecified: Secondary | ICD-10-CM | POA: Diagnosis not present

## 2022-05-02 DIAGNOSIS — Z86018 Personal history of other benign neoplasm: Secondary | ICD-10-CM | POA: Diagnosis not present

## 2022-05-02 DIAGNOSIS — R109 Unspecified abdominal pain: Secondary | ICD-10-CM | POA: Diagnosis not present

## 2022-05-02 DIAGNOSIS — Z79899 Other long term (current) drug therapy: Secondary | ICD-10-CM | POA: Diagnosis not present

## 2022-05-02 DIAGNOSIS — D649 Anemia, unspecified: Secondary | ICD-10-CM | POA: Diagnosis not present

## 2022-05-02 DIAGNOSIS — R399 Unspecified symptoms and signs involving the genitourinary system: Secondary | ICD-10-CM | POA: Diagnosis not present

## 2022-05-02 DIAGNOSIS — R809 Proteinuria, unspecified: Secondary | ICD-10-CM | POA: Diagnosis not present

## 2022-05-02 DIAGNOSIS — I10 Essential (primary) hypertension: Secondary | ICD-10-CM | POA: Diagnosis not present

## 2022-05-02 DIAGNOSIS — I5032 Chronic diastolic (congestive) heart failure: Secondary | ICD-10-CM | POA: Diagnosis not present

## 2022-05-02 DIAGNOSIS — R5383 Other fatigue: Secondary | ICD-10-CM | POA: Diagnosis not present

## 2022-05-02 DIAGNOSIS — J811 Chronic pulmonary edema: Secondary | ICD-10-CM | POA: Diagnosis not present

## 2022-05-02 LAB — HEPATIC FUNCTION PANEL
ALT: 12 U/L (ref 7–35)
AST: 9 — AB (ref 13–35)
Alkaline Phosphatase: 55 (ref 25–125)
Bilirubin, Total: 0.5

## 2022-05-02 LAB — BASIC METABOLIC PANEL
BUN: 12 (ref 4–21)
CO2: 24 — AB (ref 13–22)
Chloride: 103 (ref 99–108)
Creatinine: 0.9 (ref 0.5–1.1)
Glucose: 92
Potassium: 3.7 mEq/L (ref 3.5–5.1)

## 2022-05-02 LAB — CBC WITH DIFFERENTIAL/PLATELET
Abs Immature Granulocytes: 0.1 10*3/uL — ABNORMAL HIGH (ref 0.00–0.07)
Basophils Absolute: 0 10*3/uL (ref 0.0–0.1)
Basophils Relative: 0 %
Eosinophils Absolute: 0.2 10*3/uL (ref 0.0–0.5)
Eosinophils Relative: 2 %
HCT: 28.2 % — ABNORMAL LOW (ref 36.0–46.0)
Hemoglobin: 7.6 g/dL — ABNORMAL LOW (ref 12.0–15.0)
Immature Granulocytes: 1 %
Lymphocytes Relative: 22 %
Lymphs Abs: 2.6 10*3/uL (ref 0.7–4.0)
MCH: 19.5 pg — ABNORMAL LOW (ref 26.0–34.0)
MCHC: 27 g/dL — ABNORMAL LOW (ref 30.0–36.0)
MCV: 72.5 fL — ABNORMAL LOW (ref 80.0–100.0)
Monocytes Absolute: 1.3 10*3/uL — ABNORMAL HIGH (ref 0.1–1.0)
Monocytes Relative: 10 %
Neutro Abs: 7.8 10*3/uL — ABNORMAL HIGH (ref 1.7–7.7)
Neutrophils Relative %: 65 %
Platelets: 375 10*3/uL (ref 150–400)
RBC: 3.89 MIL/uL (ref 3.87–5.11)
RDW: 21.2 % — ABNORMAL HIGH (ref 11.5–15.5)
WBC: 12 10*3/uL — ABNORMAL HIGH (ref 4.0–10.5)
nRBC: 0.4 % — ABNORMAL HIGH (ref 0.0–0.2)

## 2022-05-02 LAB — IRON AND TIBC
Iron: 24 ug/dL — ABNORMAL LOW (ref 28–170)
Saturation Ratios: 5 % — ABNORMAL LOW (ref 10.4–31.8)
TIBC: 447 ug/dL (ref 250–450)
UIBC: 423 ug/dL

## 2022-05-02 LAB — COMPREHENSIVE METABOLIC PANEL
ALT: 15 U/L (ref 0–44)
AST: 13 U/L — ABNORMAL LOW (ref 15–41)
Albumin: 4.1 g/dL (ref 3.5–5.0)
Albumin: 4.2 (ref 3.5–5.0)
Alkaline Phosphatase: 51 U/L (ref 38–126)
Anion gap: 6 (ref 5–15)
BUN: 13 mg/dL (ref 6–20)
CO2: 23 mmol/L (ref 22–32)
Calcium: 9.5 mg/dL (ref 8.9–10.3)
Chloride: 111 mmol/L (ref 98–111)
Creatinine, Ser: 0.95 mg/dL (ref 0.44–1.00)
GFR, Estimated: >60 mL/min (ref >60)
Glucose, Bld: 97 mg/dL (ref 70–99)
Potassium: 3.5 mmol/L (ref 3.5–5.1)
Sodium: 140 mmol/L (ref 135–145)
Total Bilirubin: 0.7 mg/dL (ref 0.3–1.2)
Total Protein: 7.8 g/dL (ref 6.5–8.1)

## 2022-05-02 LAB — TROPONIN I (HIGH SENSITIVITY): Troponin I (High Sensitivity): 3 ng/L (ref <18)

## 2022-05-02 LAB — RETICULOCYTES
Immature Retic Fract: 42.4 % — ABNORMAL HIGH (ref 2.3–15.9)
RBC.: 3.86 MIL/uL — ABNORMAL LOW (ref 3.87–5.11)
Retic Count, Absolute: 127.8 10*3/uL (ref 19.0–186.0)
Retic Ct Pct: 3.3 % — ABNORMAL HIGH (ref 0.4–3.1)

## 2022-05-02 LAB — IRON,TIBC AND FERRITIN PANEL
Ferritin: 3
Iron: 22
TIBC: 472

## 2022-05-02 LAB — PREPARE RBC (CROSSMATCH)

## 2022-05-02 LAB — FOLATE: Folate: 8.5 ng/mL (ref >5.9)

## 2022-05-02 LAB — CBC AND DIFFERENTIAL
HCT: 24 — AB (ref 36–46)
Hemoglobin: 7.2 — AB (ref 12.0–16.0)
Platelets: 350 10*3/uL (ref 150–400)
WBC: 9.5

## 2022-05-02 LAB — CBC: RBC: 3.69 — AB (ref 3.87–5.11)

## 2022-05-02 LAB — VITAMIN B12
Vitamin B-12: 191 pg/mL (ref 180–914)
Vitamin B-12: 204

## 2022-05-02 LAB — VITAMIN D 25 HYDROXY (VIT D DEFICIENCY, FRACTURES): Vit D, 25-Hydroxy: 9.6

## 2022-05-02 LAB — TSH: TSH: 1.55 (ref 0.41–5.90)

## 2022-05-02 LAB — HEMOGLOBIN A1C: Hemoglobin A1C: 5.7

## 2022-05-02 LAB — FERRITIN: Ferritin: 3 ng/mL — ABNORMAL LOW (ref 11–307)

## 2022-05-02 MED ORDER — SODIUM CHLORIDE 0.9 % IV BOLUS
500.0000 mL | Freq: Once | INTRAVENOUS | Status: AC
  Administered 2022-05-02: 500 mL via INTRAVENOUS

## 2022-05-02 MED ORDER — SODIUM CHLORIDE 0.9 % IV SOLN
10.0000 mL/h | Freq: Once | INTRAVENOUS | Status: AC
  Administered 2022-05-02: 10 mL/h via INTRAVENOUS

## 2022-05-02 NOTE — Discharge Instructions (Signed)
Continue taking your iron supplement   See your GYN doctor to discuss options regarding your bleeding  Return to ER if you have uncontrolled bleeding, shortness of breath

## 2022-05-02 NOTE — ED Provider Triage Note (Signed)
Emergency Medicine Provider Triage Evaluation Note  Christine [name] , a 46 y.o. female  was evaluated in triage.  Pt complains of fatigue, SOB, and low hemoglobin. Hx of similar due to heavy menses. Had blood work at the doctor with hemoglobin 7.2. Having intermittent episodes of chest pressure. Hx of bipolar, diastolic HF  Review of Systems  Positive: As above Negative: Syncope, blood in stool  Physical Exam  BP (!) 185/85 (BP Location: Left Arm)   Pulse (!) 111   Temp 98.9 F (37.2 C) (Oral)   Resp 20   SpO2 100%  Gen:   Awake, no distress   Resp:  Normal effort  MSK:   Moves extremities without difficulty  Other:    Medical Decision Making  Medically screening exam initiated at 5:30 PM.  Appropriate orders placed.  Christine [name] was informed that the remainder of the evaluation will be completed by another provider, this initial triage assessment does not replace that evaluation, and the importance of remaining in the ED until their evaluation is complete.  Workup initiated including anemia panel and type and screen   Christine Leyba T, PA-C 05/02/22 1730

## 2022-05-02 NOTE — ED Provider Notes (Signed)
[facility] DEPT Provider Note   CSN: [id number] Arrival date & time: 05/02/22  1717     History  Chief Complaint  Patient presents with   Fatigue   Shortness of Breath    Christine [name] is a 46 y.o. female hx of DM, here with fatigue, SOB. Patient has history of anemia from vaginal bleeding.  Patient states that she had heavy menses about 2 weeks ago.  Patient states that she has been feeling weak and dizzy and short of breath.  Patient went to PCP today and hemoglobin was 7.2 was sent here for transfusion.  Patient states that she usually gets transfused when hemoglobin is less than 8.  Her baseline hemoglobin is 11.  Denies any active vaginal bleeding right now.  The history is provided by the patient.       Home Medications Prior to Admission medications   Medication Sig Start Date End Date Taking? Authorizing Provider  acetaminophen (TYLENOL) 325 MG tablet Take 650 mg by mouth every 6 (six) hours as needed for mild pain or headache.    [provider]  amLODipine (NORVASC) 5 MG tablet Take 1 tablet (5 mg total) by mouth daily. 08/10/19 08/09/20  Jennye Boroughs, MD  ARIPiprazole (ABILIFY) 15 MG tablet Take 1 tablet (15 mg total) by mouth daily. 08/13/21 08/13/22  [name], [name], MD  chlorthalidone (HYGROTON) 25 MG tablet TAKE 1/2 TABLET(12.5 MG) BY MOUTH DAILY 02/13/22   [name], MD  clonazePAM (KLONOPIN) 0.5 MG tablet Take 1 tablet (0.5 mg total) by mouth daily as needed for anxiety. 08/13/21   [name], [name], MD  fluticasone (FLONASE) 50 MCG/ACT nasal spray 1 spray in each nostril    [provider]  lamoTRIgine (LAMICTAL) 150 MG tablet Take 1 tablet (150 mg total) by mouth 2 (two) times daily. For mood 08/13/21   [name], [name], MD  lithium carbonate (LITHOBID) 300 MG CR tablet Take 1 tablet (300 mg total) by mouth 2 (two) times daily. 08/13/21 08/13/22  [name], [name], MD  metFORMIN (GLUCOPHAGE) 500 MG tablet Take 1 tablet (500 mg  total) by mouth 2 (two) times daily with a meal. 10/20/19   [name], MD  metoprolol succinate (TOPROL-XL) 100 MG 24 hr tablet TAKE 1 TABLET BY MOUTH DAILY WITH OR IMMEDIATELY FOLLOWING A MEAL 11/07/21   [name], MD      Allergies    Penicillins    Review of Systems   Review of Systems  Respiratory:  Positive for shortness of breath.   All other systems reviewed and are negative.   Physical Exam Updated Vital Signs BP 128/64   Pulse 77   Temp 98.7 F (37.1 C) (Oral)   Resp (!) 22   SpO2 98%  Physical Exam Vitals and nursing note reviewed.  Constitutional:      Comments: Slightly pale  HENT:     Head: Normocephalic.     Mouth/Throat:     Mouth: Mucous membranes are moist.  Eyes:     Comments: Conjunctivae is pale  Cardiovascular:     Rate and Rhythm: Normal rate and regular rhythm.  Pulmonary:     Effort: Pulmonary effort is normal.     Breath sounds: Normal breath sounds.  Abdominal:     General: Bowel sounds are normal.     Palpations: Abdomen is soft.  Musculoskeletal:        General: Normal range of motion.     Cervical  back: Normal range of motion and neck supple.  Skin:    General: Skin is warm.     Capillary Refill: Capillary refill takes less than 2 seconds.  Neurological:     General: No focal deficit present.     Mental Status: She is alert and oriented to person, place, and time.  Psychiatric:        Mood and Affect: Mood normal.        Behavior: Behavior normal.     ED Results / Procedures / Treatments   Labs (all labs ordered are listed, but only abnormal results are displayed) Labs Reviewed  IRON AND TIBC - Abnormal; Notable for the following components:      Result Value   Iron 24 (*)    Saturation Ratios 5 (*)    All other components within normal limits  FERRITIN - Abnormal; Notable for the following components:   Ferritin 3 (*)    All other components within normal limits  RETICULOCYTES - Abnormal; Notable for the  following components:   Retic Ct Pct 3.3 (*)    RBC. 3.86 (*)    Immature Retic Fract 42.4 (*)    All other components within normal limits  CBC WITH DIFFERENTIAL/PLATELET - Abnormal; Notable for the following components:   WBC 12.0 (*)    Hemoglobin 7.6 (*)    HCT 28.2 (*)    MCV 72.5 (*)    MCH 19.5 (*)    MCHC 27.0 (*)    RDW 21.2 (*)    nRBC 0.4 (*)    Neutro Abs 7.8 (*)    Monocytes Absolute 1.3 (*)    Abs Immature Granulocytes 0.10 (*)    All other components within normal limits  COMPREHENSIVE METABOLIC PANEL - Abnormal; Notable for the following components:   AST 13 (*)    All other components within normal limits  VITAMIN B12  FOLATE  TYPE AND SCREEN  PREPARE RBC (CROSSMATCH)  TROPONIN I (HIGH SENSITIVITY)  TROPONIN I (HIGH SENSITIVITY)    EKG EKG Interpretation  Date/Time:  Thursday May 02 2022 17:25:48 EDT Ventricular Rate:  111 PR Interval:  174 QRS Duration: 103 QT Interval:  344 QTC Calculation: 468 R Axis:   73 Text Interpretation: Sinus tachycardia Low voltage, precordial leads No significant change since last tracing Confirmed by Wandra Arthurs [phone]) on 05/02/2022 6:47:19 PM  Radiology DG Chest 2 View  Result Date: 05/02/2022 CLINICAL DATA:  Worsening shortness of breath and fatigue for 2 weeks. Anemia. EXAM: CHEST - 2 VIEW COMPARISON:  Chest radiograph and CTA 08/07/2019 FINDINGS: The cardiac silhouette is upper limits of normal in size. Mild central pulmonary vascular congestion is less prominent than on the prior radiograph. No airspace consolidation, overt pulmonary edema, pleural effusion, or pneumothorax is identified. No acute osseous abnormality is seen. IMPRESSION: Mild pulmonary vascular congestion. Electronically Signed   By: Logan Bores M.D.   On: 05/02/2022 18:24    Procedures Procedures    Medications Ordered in ED Medications  sodium chloride 0.9 % bolus 500 mL (0 mLs Intravenous Stopped 05/02/22 2003)  0.9 %  sodium chloride  infusion (10 mL/hr Intravenous New Bag/Given 05/02/22 2003)    ED Course/ Medical Decision Making/ A&P                           Medical Decision Making Christine [name] is a 46 y.o. female here presenting with possible symptomatic anemia.  Patient had  vaginal bleeding several weeks ago that stopped.  Hemoglobin was 7.2 outpatient.  We will repeat hemoglobin.  Patient has no active bleeding so we will hold off on vaginal ultrasound or vaginal exam right now.  10:24 PM Hemoglobin is 7.6.  Since she is symptomatic I ordered 1 unit blood transfusion.  Patient was observed about an hour afterwards and has no transfusion reaction.  At this point, patient is stable for discharge.  I told her to follow-up with GYN outpatient   Problems Addressed: Anemia, unspecified type: acute illness or injury  Amount and/or Complexity of Data Reviewed Labs: ordered. Decision-making details documented in ED Course. Radiology: ordered and independent interpretation performed. Decision-making details documented in ED Course.  Risk Prescription drug management.    Final Clinical Impression(s) / ED Diagnoses Final diagnoses:  None    Rx / DC Orders ED Discharge Orders     None         Drenda Freeze, MD 05/02/22 2225

## 2022-05-02 NOTE — ED Notes (Signed)
Pt was given diet ginger ale for fluid challenge

## 2022-05-02 NOTE — ED Triage Notes (Signed)
Pt states that for approximately two weeks she has been having worsening SOB and fatigue. Pt states that she was seen at PCP earlier today and told to come to ED for Hgb of 7.2 Pt denies any known bleeding. LMP 9/10. Pt has hx of anemia with needing multiple blood transfusions.

## 2022-05-03 LAB — TYPE AND SCREEN
ABO/RH(D): O POS
Antibody Screen: NEGATIVE
Unit division: 0

## 2022-05-03 LAB — BPAM RBC
Blood Product Expiration Date: 202310312359
ISSUE DATE / TIME: 202309281945
Unit Type and Rh: 5100

## 2022-05-08 ENCOUNTER — Encounter (INDEPENDENT_AMBULATORY_CARE_PROVIDER_SITE_OTHER): Payer: Medicare Other | Admitting: Family Medicine

## 2022-05-09 ENCOUNTER — Ambulatory Visit (INDEPENDENT_AMBULATORY_CARE_PROVIDER_SITE_OTHER): Payer: Medicare Other | Admitting: Family Medicine

## 2022-05-09 ENCOUNTER — Encounter (INDEPENDENT_AMBULATORY_CARE_PROVIDER_SITE_OTHER): Payer: Self-pay | Admitting: Family Medicine

## 2022-05-09 VITALS — BP 118/75 | HR 86 | Temp 99.2°F | Ht 64.0 in | Wt 332.0 lb

## 2022-05-09 DIAGNOSIS — G4733 Obstructive sleep apnea (adult) (pediatric): Secondary | ICD-10-CM

## 2022-05-09 DIAGNOSIS — Z1331 Encounter for screening for depression: Secondary | ICD-10-CM | POA: Diagnosis not present

## 2022-05-09 DIAGNOSIS — E559 Vitamin D deficiency, unspecified: Secondary | ICD-10-CM | POA: Diagnosis not present

## 2022-05-09 DIAGNOSIS — R0602 Shortness of breath: Secondary | ICD-10-CM

## 2022-05-09 DIAGNOSIS — F319 Bipolar disorder, unspecified: Secondary | ICD-10-CM

## 2022-05-09 DIAGNOSIS — R739 Hyperglycemia, unspecified: Secondary | ICD-10-CM | POA: Diagnosis not present

## 2022-05-09 DIAGNOSIS — E7849 Other hyperlipidemia: Secondary | ICD-10-CM | POA: Diagnosis not present

## 2022-05-09 DIAGNOSIS — D508 Other iron deficiency anemias: Secondary | ICD-10-CM

## 2022-05-09 DIAGNOSIS — R5383 Other fatigue: Secondary | ICD-10-CM

## 2022-05-09 DIAGNOSIS — I10 Essential (primary) hypertension: Secondary | ICD-10-CM | POA: Diagnosis not present

## 2022-05-09 DIAGNOSIS — Z6841 Body Mass Index (BMI) 40.0 and over, adult: Secondary | ICD-10-CM

## 2022-05-09 DIAGNOSIS — Z0289 Encounter for other administrative examinations: Secondary | ICD-10-CM

## 2022-05-10 LAB — LIPID PANEL WITH LDL/HDL RATIO
Cholesterol, Total: 178 mg/dL (ref 100–199)
HDL: 38 mg/dL — ABNORMAL LOW (ref >39)
LDL Chol Calc (NIH): 125 mg/dL — ABNORMAL HIGH (ref 0–99)
LDL/HDL Ratio: 3.3 ratio — ABNORMAL HIGH (ref 0.0–3.2)
Triglycerides: 81 mg/dL (ref 0–149)
VLDL Cholesterol Cal: 15 mg/dL (ref 5–40)

## 2022-05-10 LAB — INSULIN, RANDOM: INSULIN: 26.8 u[IU]/mL — ABNORMAL HIGH (ref 2.6–24.9)

## 2022-05-14 NOTE — Progress Notes (Signed)
Chief Complaint:   OBESITY Christine [name] (MR# [id number]) is a 46 y.o. female who presents for evaluation and treatment of obesity and related comorbidities. Current BMI is Body mass index is 56.99 kg/m. Christine [name] has been struggling with her weight for many years and has been unsuccessful in either losing weight, maintaining weight loss, or reaching her healthy weight goal.  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 She is a returning patient. Smoothie (1/2 spinach, kale, almond butter or oats,flax 1/2 almond, 1/2 fat free milk) in am or [place] [employer] delight breakfast sandwich. Lunch--sandwich and chips or applesauce [place] or fast food [place] or Japanese (sweet and sour chicken). Dinner--pasta or rice or [place] with curry sauce or air fried chicken with fries. Previously on Cat 3.  Christine [name]  is currently in the action stage of change and ready to dedicate time achieving and maintaining a healthier weight. Christine [name] is interested in becoming our patient and working on intensive lifestyle modifications including (but not limited to) diet and exercise for weight loss.  Christine [name]'s habits were reviewed today and are as follows: she thinks her family will eat healthier with her, her desired weight loss is 170 pounds, she has been heavy most of her life, she started gaining weight during her chilhood, her heaviest weight ever was 355 pounds, she has significant food cravings issues, she wakes up frequently in the middle of the night to eat, she frequently makes poor food choices, she has problems with excessive hunger, she frequently eats larger portions than normal, and she struggles with emotional eating.  Depression Screen [place]'s Food and Mood (modified PHQ-9) score was 23.     05/09/2022    4:33 PM  Depression screen PHQ 2/9  Decreased Interest 3  Down, Depressed, Hopeless 3  PHQ - 2 Score 6  Altered sleeping 3  Tired, decreased energy 3  Change in appetite 3  Feeling bad or failure  about yourself  3  Trouble concentrating 3  Moving slowly or fidgety/restless 2  Suicidal thoughts 3  PHQ-9 Score 26  Difficult doing work/chores Extremely dIfficult   Subjective:   1. Other fatigue Christine [name]  admits to daytime somnolence and admits to waking up still tired. Patient has a history of symptoms of daytime fatigue and morning headache. Christine [name] generally gets 3 or 4 hours of sleep per night, and states that she has nightime awakenings. Snoring is present. Apneic episodes are present. Epworth Sleepiness Score is 12.    2. SOBOE (shortness of breath on exertion) Christine [name] notes increasing shortness of breath with exercising and seems to be worsening over time with weight gain. She notes getting out of breath sooner with activity than she used to. This has not gotten worse recently. Christine [name] denies shortness of breath at rest or orthopnea.   3. Essential hypertension Christine [name] is on Amlodipine, Metoprolol, Chlorthalidone. Blood pressure well controlled today. Denies chest pain, chest pressure and headache.  4. Hyperglycemia A1c 5.7, diagnosed previously at [facility]. She is on Metformin 500 mg twice a day.  5. Other iron deficiency anemia Fairly significant anemia noted on labs. Received blood transfusion 1 week ago. MCV 66, H/H 7.6/28.2. Regular periods every 26 days.  6. Bipolar affective disorder, remission status unspecified ([place]) Christine [name] is on Lithium, Vraylar and Lamictal, managed by Dr. Denna Haggard. She was hospitalized for 5 days 3 years ago.  7. Vitamin D deficiency Christine [name]'s Vit D level of 40. She notes fatigue.  8. OSA (obstructive sleep apnea)  Christine [name]  has a CPAP but still feeling very fatigued.  9. Other hyperlipidemia Christine [name]'s  last LDL 131, HDL 38, and Trigly 70.  Assessment/Plan:   1. Other fatigue We will obtain labs today. Christine [name]  feel that her weight is causing her energy to be lower than it should be. Fatigue may be related to obesity, depression or many other causes. Labs  will be ordered, and in the meanwhile, Christine [name] will focus on self care including making healthy food choices, increasing physical activity and focusing on stress reduction.   2. SOBOE (shortness of breath on exertion) We will obtain labs today.  Christine [name]  feel that she gets out of breath more easily that she used to when she exercises. Charlane's shortness of breath appears to be obesity related and exercise induced. She has agreed to work on weight loss and gradually increase exercise to treat her exercise induced shortness of breath. Will continue to monitor closely.   3. Essential hypertension We will follow up on blood pressure at next visit.  4. Hyperglycemia We will obtain labs today.  - Insulin, random  5. Other iron deficiency anemia  Christine [name] is encouraged to follow up with hematology.  6. Bipolar affective disorder, remission status unspecified ([place]) Christine [name] instructed to follow up with [facility] provider and get lithium level. Follow up at next appointment concerning new therapist.  7. Vitamin D deficiency START Vit D 50k IU/ weekly for 1 month with 0 refills.   8. OSA (obstructive sleep apnea) Follow up with sleep doctor.  9. Other hyperlipidemia We will obtain labs today.  - Lipid Panel With LDL/HDL Ratio  10. Depression screening  Christine [name] had a positive depression screening. Depression is commonly associated with obesity and often results in emotional eating behaviors. We will monitor this closely and work on CBT to help improve the non-hunger eating patterns. Referral to Psychology may be required if no improvement is seen as she continues in our clinic.   11. Class 3 severe obesity with serious comorbidity and body mass index (BMI) of 50.0 to 59.9 in adult, unspecified obesity type ([place]) Christine [name]  currently in the action stage of change and her goal is to continue with weight loss efforts. I recommend Christine [name] begin the structured treatment plan as follows:  She has agreed to the  Category 4 Plan +200.  Exercise goals: No exercise has been prescribed at this time.   Behavioral modification strategies: increasing lean protein intake, meal planning and cooking strategies, keeping healthy foods in the home, and planning for success.  She was informed of the importance of frequent follow-up visits to maximize her success with intensive lifestyle modifications for her multiple health conditions. She was informed we would discuss her lab results at her next visit unless there is a critical issue that needs to be addressed sooner. Christine [name] agreed to keep her next visit at the agreed upon time to discuss these results.  Objective:   Blood pressure 118/75, pulse 86, temperature 99.2 F (37.3 C), height '5\' 4"'$  (1.626 m), weight (!) 332 lb (150.6 kg), last menstrual period 04/14/2022, SpO2 98 %. Body mass index is 56.99 kg/m.  EKG: Normal sinus rhythm, rate.  Indirect Calorimeter completed today shows a VO2 of 450 ml and a REE of 3110.  Her calculated basal metabolic rate is 7829 thus her basal metabolic rate is better than expected.  General: Cooperative, alert, well developed, in no acute distress. HEENT: Conjunctivae and lids unremarkable. Cardiovascular: Regular rhythm.  Lungs: Normal work of breathing. Neurologic:  No focal deficits.   Lab Results  Component Value Date   CREATININE 0.95 05/02/2022   BUN 13 05/02/2022   NA 140 05/02/2022   K 3.5 05/02/2022   CL 111 05/02/2022   CO2 23 05/02/2022   Lab Results  Component Value Date   ALT 15 05/02/2022   AST 13 (L) 05/02/2022   ALKPHOS 51 05/02/2022   BILITOT 0.7 05/02/2022   Lab Results  Component Value Date   HGBA1C 5.7 (H) 10/07/2018   Lab Results  Component Value Date   INSULIN 26.8 (H) 05/09/2022   INSULIN 19.5 10/07/2018   Lab Results  Component Value Date   TSH 1.215 08/08/2019   Lab Results  Component Value Date   CHOL 178 05/09/2022   HDL 38 (L) 05/09/2022   LDLCALC 125 (H) 05/09/2022    TRIG 81 05/09/2022   Lab Results  Component Value Date   WBC 12.0 (H) 05/02/2022   HGB 7.6 (L) 05/02/2022   HCT 28.2 (L) 05/02/2022   MCV 72.5 (L) 05/02/2022   PLT 375 05/02/2022   Lab Results  Component Value Date   IRON 24 (L) 05/02/2022   TIBC 447 05/02/2022   FERRITIN 3 (L) 05/02/2022   Attestation Statements:   Reviewed by clinician on day of visit: allergies, medications, problem list, medical history, surgical history, family history, social history, and previous encounter notes.  Time spent on visit including pre-visit chart review and post-visit charting and care was 45 minutes.   I, Elnora Morrison, RMA am acting as transcriptionist for Coralie Common, MD.  This is the patient's first visit at [facility]. The patient's NEW PATIENT PACKET was reviewed at length. Included in the packet: current and past health history, medications, allergies, ROS, gynecologic history (women only), surgical history, family history, social history, weight history, weight loss surgery history (for those that have had weight loss surgery), nutritional evaluation, mood and food questionnaire, PHQ9, Epworth questionnaire, sleep habits questionnaire, patient life and health improvement goals questionnaire. These will all be scanned into the patient's chart under media.   During the visit, I independently reviewed the patient's EKG, bioimpedance scale results, and indirect calorimeter results. I used this information to tailor a meal plan for the patient that will help her to lose weight and will improve her obesity-related conditions going forward. I performed a medically necessary appropriate examination and/or evaluation. I discussed the assessment and treatment plan with the patient. The patient was provided an opportunity to ask questions and all were answered. The patient agreed with the plan and demonstrated an understanding of the instructions. Labs were ordered at this visit and  will be reviewed at the next visit unless more critical results need to be addressed immediately. Clinical information was updated and documented in the EMR.   Time spent on visit including pre-visit chart review and post-visit care was 45 minutes.    I have reviewed the above documentation for accuracy and completeness, and I agree with the above. - Coralie Common, MD

## 2022-05-23 ENCOUNTER — Encounter: Payer: Self-pay | Admitting: Nurse Practitioner

## 2022-05-23 ENCOUNTER — Inpatient Hospital Stay: Payer: Medicare Other

## 2022-05-23 ENCOUNTER — Inpatient Hospital Stay (HOSPITAL_BASED_OUTPATIENT_CLINIC_OR_DEPARTMENT_OTHER): Payer: Medicare Other | Admitting: Nurse Practitioner

## 2022-05-23 ENCOUNTER — Encounter (INDEPENDENT_AMBULATORY_CARE_PROVIDER_SITE_OTHER): Payer: Self-pay | Admitting: Family Medicine

## 2022-05-23 ENCOUNTER — Inpatient Hospital Stay: Payer: Medicare Other | Attending: Nurse Practitioner

## 2022-05-23 ENCOUNTER — Ambulatory Visit (INDEPENDENT_AMBULATORY_CARE_PROVIDER_SITE_OTHER): Payer: Medicare Other | Admitting: Family Medicine

## 2022-05-23 ENCOUNTER — Other Ambulatory Visit: Payer: Self-pay

## 2022-05-23 VITALS — BP 161/75 | HR 97 | Temp 97.9°F | Resp 19 | Ht 64.0 in | Wt 337.7 lb

## 2022-05-23 VITALS — BP 145/81 | HR 105 | Temp 98.6°F | Ht 64.0 in | Wt 333.0 lb

## 2022-05-23 VITALS — BP 123/87 | HR 76 | Resp 20

## 2022-05-23 DIAGNOSIS — D509 Iron deficiency anemia, unspecified: Secondary | ICD-10-CM | POA: Diagnosis not present

## 2022-05-23 DIAGNOSIS — R7303 Prediabetes: Secondary | ICD-10-CM | POA: Diagnosis not present

## 2022-05-23 DIAGNOSIS — E7849 Other hyperlipidemia: Secondary | ICD-10-CM

## 2022-05-23 DIAGNOSIS — E559 Vitamin D deficiency, unspecified: Secondary | ICD-10-CM

## 2022-05-23 DIAGNOSIS — Z79899 Other long term (current) drug therapy: Secondary | ICD-10-CM | POA: Insufficient documentation

## 2022-05-23 DIAGNOSIS — D500 Iron deficiency anemia secondary to blood loss (chronic): Secondary | ICD-10-CM | POA: Diagnosis not present

## 2022-05-23 DIAGNOSIS — D508 Other iron deficiency anemias: Secondary | ICD-10-CM

## 2022-05-23 DIAGNOSIS — E669 Obesity, unspecified: Secondary | ICD-10-CM | POA: Diagnosis not present

## 2022-05-23 DIAGNOSIS — I10 Essential (primary) hypertension: Secondary | ICD-10-CM | POA: Diagnosis not present

## 2022-05-23 DIAGNOSIS — Z6841 Body Mass Index (BMI) 40.0 and over, adult: Secondary | ICD-10-CM

## 2022-05-23 DIAGNOSIS — F319 Bipolar disorder, unspecified: Secondary | ICD-10-CM | POA: Insufficient documentation

## 2022-05-23 LAB — CBC WITH DIFFERENTIAL (CANCER CENTER ONLY)
Abs Immature Granulocytes: 0.15 10*3/uL — ABNORMAL HIGH (ref 0.00–0.07)
Basophils Absolute: 0 10*3/uL (ref 0.0–0.1)
Basophils Relative: 0 %
Eosinophils Absolute: 0.2 10*3/uL (ref 0.0–0.5)
Eosinophils Relative: 1 %
HCT: 28.7 % — ABNORMAL LOW (ref 36.0–46.0)
Hemoglobin: 8.2 g/dL — ABNORMAL LOW (ref 12.0–15.0)
Immature Granulocytes: 1 %
Lymphocytes Relative: 13 %
Lymphs Abs: 1.4 10*3/uL (ref 0.7–4.0)
MCH: 20.1 pg — ABNORMAL LOW (ref 26.0–34.0)
MCHC: 28.6 g/dL — ABNORMAL LOW (ref 30.0–36.0)
MCV: 70.3 fL — ABNORMAL LOW (ref 80.0–100.0)
Monocytes Absolute: 0.9 10*3/uL (ref 0.1–1.0)
Monocytes Relative: 8 %
Neutro Abs: 8.4 10*3/uL — ABNORMAL HIGH (ref 1.7–7.7)
Neutrophils Relative %: 77 %
Platelet Count: 435 10*3/uL — ABNORMAL HIGH (ref 150–400)
RBC: 4.08 MIL/uL (ref 3.87–5.11)
RDW: 21.6 % — ABNORMAL HIGH (ref 11.5–15.5)
WBC Count: 11.2 10*3/uL — ABNORMAL HIGH (ref 4.0–10.5)
nRBC: 0.4 % — ABNORMAL HIGH (ref 0.0–0.2)

## 2022-05-23 LAB — IRON AND IRON BINDING CAPACITY (CC-WL,HP ONLY)
Iron: 24 ug/dL — ABNORMAL LOW (ref 28–170)
Saturation Ratios: 5 % — ABNORMAL LOW (ref 10.4–31.8)
TIBC: 466 ug/dL — ABNORMAL HIGH (ref 250–450)
UIBC: 442 ug/dL (ref 148–442)

## 2022-05-23 LAB — FERRITIN: Ferritin: 4 ng/mL — ABNORMAL LOW (ref 11–307)

## 2022-05-23 MED ORDER — VITAMIN D (ERGOCALCIFEROL) 1.25 MG (50000 UNIT) PO CAPS: 50000.0000 [IU] | ORAL_CAPSULE | ORAL | Status: DC

## 2022-05-23 MED ORDER — SODIUM CHLORIDE 0.9 % IV SOLN
300.0000 mg | Freq: Once | INTRAVENOUS | Status: AC
  Administered 2022-05-23: 300 mg via INTRAVENOUS
  Filled 2022-05-23: qty 300

## 2022-05-23 MED ORDER — SODIUM CHLORIDE 0.9 % IV SOLN: Freq: Once | INTRAVENOUS | Status: AC

## 2022-05-23 NOTE — Progress Notes (Signed)
[name]   [phone] [phone]) [phone]   Clinic Follow up Note   Patient Care Team: Carilyn Goodpasture, NP as PCP - General (Family Medicine) Marykay Lex, MD as PCP - Cardiology (Cardiology) 05/23/2022  CHIEF COMPLAINT: Follow-up iron deficiency anemia  CURRENT THERAPY: IV iron as needed, previously Feraheme now Venofer, last given 06/01/2021 and 06/09/2021; she does not tolerate oral iron  INTERVAL HISTORY: Christine [name] returns for follow-up, last seen by me 12/27/2020.  She canceled her visit in December due to her pipes bursting, and has had a lot going on since then.  Her father-in-law passed away yesterday.  She developed increased shortness of breath from baseline and more fatigue, presented to PCP and was found to have hemoglobin 7.2.  She was then sent to ED where her hemoglobin was 7.8 (on 9/28).  She received 1 unit.  She felt better the next day but not much, still tired.  She completed antibiotics for a UTI.  She continues to have heavy bleeding for 4-5 days out of a 7-8-day cycle q. 26 days.  She is establishing with a new OB/GYN to discuss hysterectomy.  Denies other bleeding such as epistaxis or hematochezia.  Continues to follow-up with healthy weight management, PCP, and mental health.  All other systems were reviewed with the patient and are negative.  MEDICAL HISTORY:  Past Medical History:  Diagnosis Date   Anemia    Presumably from menorrhagia/menomenorrhagia; has had both blood and iron transfusions   Anxiety    Back pain    Bipolar 1 disorder (HCC)    With depression   Chest pain    Depression    Diastolic heart failure (HCC)    Essential hypertension    Fibromyalgia    Headache    History of blood transfusion 08/2017   [facility]   History of hiatal hernia    IBS (irritable bowel syndrome)    Infertility, female    Infertility, female    Lactose intolerance    Lower extremity edema    Migraines    Morbid obesity (HCC)     Osteoarthritis    Hands, ankles; HLA-B27 positive   PCOS (polycystic ovarian syndrome)    Prediabetes    Seasonal allergies    With recurrent allergic rhinitis   Sleep apnea    SOB (shortness of breath)    Uterine leiomyoma    Vitamin D deficiency     SURGICAL HISTORY: Past Surgical History:  Procedure Laterality Date   CARDIAC CT ANGIOGRAM  03/2019    Ca Score 0. Normal Coronary origin R dominant. No evidence of CAD. ? Liver nodules - poorly visualized (consider MRI w & w/o Gad contrast)   CERVICAL POLYPECTOMY  01/15/2018   Procedure: CERVICAL POLYPECTOMY;  Surgeon: Richardean Chimera, MD;  Location: [facility];  Service: Gynecology;;   COLONOSCOPY  2008   Normal   DIAGNOSTIC LAPAROSCOPY  08/1999   dermoid cyst, RSO   DILATION AND CURETTAGE OF UTERUS  05/2004   MAB   DILATION AND CURETTAGE OF UTERUS  04/2015   HYSTEROSCOPY WITH D & C  07/22/2011   Procedure: DILATATION AND CURETTAGE /HYSTEROSCOPY;  Surgeon: Jeani Hawking, MD;  Location: [facility];  Service: Gynecology;;   HYSTEROSCOPY WITH D & C N/A 01/15/2018   Procedure: DILATATION AND CURETTAGE Melton Krebs WITH MYOSURE;  Surgeon: Richardean Chimera, MD;  Location: [facility];  Service: Gynecology;  Laterality: N/A;   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  Suggestion of high cardiac output (possibly related to anemia thyrotoxicosis, Pregnancy, sepsis etc.) -- was in setting of symptomatic anemia - Hgb 5.7    I have reviewed the social history and family history with the patient and they are unchanged from previous note.  ALLERGIES:  is allergic to penicillins.  MEDICATIONS:  Current Outpatient Medications  Medication Sig Dispense Refill   acetaminophen (TYLENOL) 325 MG tablet Take 650 mg by mouth every 6 (six) hours as needed for mild pain or headache.     amLODipine (NORVASC) 5 MG tablet Take 1 tablet (5 mg total) by mouth daily. 30 tablet 0    cariprazine (VRAYLAR) 3 MG capsule Take 3 mg by mouth.     chlorthalidone (HYGROTON) 25 MG tablet TAKE 1/2 TABLET(12.5 MG) BY MOUTH DAILY 45 tablet 0   clonazePAM (KLONOPIN) 0.5 MG tablet Take 1 tablet (0.5 mg total) by mouth daily as needed for anxiety. 30 tablet 0   fluticasone (FLONASE) 50 MCG/ACT nasal spray 1 spray in each nostril     lamoTRIgine (LAMICTAL) 150 MG tablet Take 1 tablet (150 mg total) by mouth 2 (two) times daily. For mood 60 tablet 2   lithium carbonate (LITHOBID) 300 MG CR tablet Take 1 tablet (300 mg total) by mouth 2 (two) times daily. 60 tablet 2   metFORMIN (GLUCOPHAGE) 500 MG tablet Take 1 tablet (500 mg total) by mouth 2 (two) times daily with a meal. 60 tablet 0   metoprolol succinate (TOPROL-XL) 100 MG 24 hr tablet TAKE 1 TABLET BY MOUTH DAILY WITH OR IMMEDIATELY FOLLOWING A MEAL 90 tablet 1   Vitamin D, Ergocalciferol, (DRISDOL) 1.25 MG (50000 UNIT) CAPS capsule Take 1 capsule (50,000 Units total) by mouth every 7 (seven) days. 5 capsule    No current facility-administered medications for this visit.    PHYSICAL EXAMINATION: ECOG PERFORMANCE STATUS: 1 - Symptomatic but completely ambulatory  Vitals:   05/23/22 1114  BP: (!) 161/75  Pulse: 97  Resp: 19  Temp: 97.9 F (36.6 C)  SpO2: 99%   Filed Weights   05/23/22 1114  Weight: (!) 337 lb 11.2 oz (153.2 kg)    GENERAL:alert, no distress and comfortable LUNGS:  normal breathing effort Musculoskeletal:no cyanosis of digits and no clubbing  NEURO: alert & oriented x 3 with fluent speech, no focal motor/sensory deficits  LABORATORY DATA:  I have reviewed the data as listed    Latest Ref Rng & Units 05/23/2022   10:52 AM 05/02/2022    5:40 PM 05/21/2021    9:16 AM  CBC  WBC 4.0 - 10.5 K/uL 11.2  12.0  11.0   Hemoglobin 12.0 - 15.0 g/dL 8.2  7.6  10.5   Hematocrit 36.0 - 46.0 % 28.7  28.2  32.3   Platelets 150 - 400 K/uL 435  375  253         Latest Ref Rng & Units 05/02/2022    5:40 PM  05/21/2021    9:16 AM 01/19/2021    8:05 AM  CMP  Glucose 70 - 99 mg/dL 97  95  107   BUN 6 - 20 mg/dL $Remove'13  10  12   'wsoqGkP$ Creatinine 0.44 - 1.00 mg/dL 0.95  0.85  0.99   Sodium 135 - 145 mmol/L 140  142  137   Potassium 3.5 - 5.1 mmol/L 3.5  3.6  3.5   Chloride 98 -  111 mmol/L 111  107  108   CO2 22 - 32 mmol/L $RemoveB'23  24  21   'BGOkvxMB$ Calcium 8.9 - 10.3 mg/dL 9.5  9.6  9.6   Total Protein 6.5 - 8.1 g/dL 7.8  7.3  7.7   Total Bilirubin 0.3 - 1.2 mg/dL 0.7  0.5  0.7   Alkaline Phos 38 - 126 U/L 51  60  55   AST 15 - 41 U/L $Remo'13  9  13   'hZWPH$ ALT 0 - 44 U/L $Remo'15  15  18       'nlNUP$ RADIOGRAPHIC STUDIES: I have personally reviewed the radiological images as listed and agreed with the findings in the report. No results found.   ASSESSMENT & PLAN: Christine [name] is a pleasant 46 yo AAF with history of uterine fibroids, polyps, PCOS, obesity, HTN, and arthritis found to have severe iron deficiency anemia secondary to chronic blood loss    1. 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 -She required frequent IV Feraheme support in the past, from January- June 2019, then she lost to f/u -when she re-established in 02/2019, she presented with symptomatic anemia with Hg 7.8, labs revealed severe iron deficiency with ferritin <4, serum iron 18, 4% transferrin sat  -She received monthly IV Feraheme from 02/2019 - 04/2019, she responded well and anemia resolved until she became symptomatic again with exertional dyspnea, fatigue, lightheadedness -She continues to have acute on chronic anemia with IDA secondary to menorrhagia blood loss, menses are heavy 4-5 days of her cycle q26 days.  She plans to discuss hysterectomy with her new OB/GYN this year  -Christine. Lamarque appears stable.  She has acute anemia with severe iron deficiency from ED labs 9/28. Today's CBC shows hgb 8.2 and thrombocytosis which is likely reactive, she did not benefit much from blood transfusion. Will replace iron with venofer 300 mg x3,  first dose today -I recommend lab with IV iron q3 months until she has hysterectomy, she agrees -Follow-up in 6 months, or sooner if needed   2.  Overall health and wellness -Previously under care of weight loss wellness center, PCP, cardiology, and Gyn.  She is getting a new GYN and has reestablished with healthy weight management -Again reviewed age-appropriate cancer screenings this year.  Has never had a mammogram.  Last colonoscopy 10 years ago and she is nearing age 19, I recommend to repeat colonoscopy this year for colon cancer screening and also to rule out other GI source of IDA -She agrees to discuss with PCP   3. Bipolar -On aripiprazole, clonazepam, lamotrigine, lithium -She is under mental health care and on disability for bipolar but does work some  -Father-in-law passed away yesterday, she is tearful  -Also has stress from 2 teenage daughters  -Provided emotional support and active listening today  Plan: -Recent ED course and today's labs reviewed, follow-up the pending iron/ferritin levels -IV iron with Venofer 300 mg weekly x3, first dose today -Labs and IV iron every 3 months  -Follow-up in 6 months, or sooner if needed    All questions were answered. The patient knows to call the clinic with any problems, questions or concerns. No barriers to learning were detected.      Alla Feeling, NP 05/23/22

## 2022-05-23 NOTE — Patient Instructions (Signed)

## 2022-05-29 ENCOUNTER — Other Ambulatory Visit: Payer: Self-pay | Admitting: Cardiology

## 2022-05-29 ENCOUNTER — Other Ambulatory Visit (INDEPENDENT_AMBULATORY_CARE_PROVIDER_SITE_OTHER): Payer: Self-pay

## 2022-05-29 NOTE — Progress Notes (Signed)
Chief Complaint:   OBESITY Christine [name] is here to discuss her progress with her obesity treatment plan along with follow-up of her obesity related diagnoses. Christine [name] is on the Category 4 Plan and states she is following her eating plan approximately 25% of the time. Christine [name] states she is exercising 0 minutes 0 times per week.  Today's visit was #: 2 Starting weight: 332 lbs Starting date: 05/09/2022 Today's weight: 333 lbs Today's date: 05/23/2022 Total lbs lost to date: 0 lbs Total lbs lost since last in-office visit: 0  Interim History: Christine [name] got groceries a week after 1st appointment and celebrated daughters birthday since getting groceries. Felt she perhaps over indulged once getting groceries. Breakfast is ok, lunch is decent and dinner is still a working in progress. Wondering about marinade. Her father in law passed away yesterday:will be traveling to [facility].  Subjective:   1. Other iron deficiency anemia Christine [name] is seeing hematology today for labs appointment and infusion.  2. Other hyperlipidemia Christine [name]'s last LDL 125, HDL 38, Trigly 81. Not on medications.  3. Essential hypertension Christine [name]'s blood pressure is slightly elevated today. Denies chest pain, chest pressure and headache. She is on Amlodipine, Chlorthalidone and Toprol.  4. Prediabetes Christine [name]'s Insulin # 26.8. A1c done at PCP but she is significantly anemic. Decreased H/H, decreased MCV.  5. Vitamin D deficiency Christine [name] is currently taking prescription Vit D 50,000 IU once a week.  Her last level with PCP.  Assessment/Plan:   1. Other iron deficiency anemia Follow up labs from hematology.  2. Other hyperlipidemia Will repeat labs in 3 months without changes in treatment. Continue on Cat 4 plan.  3. Essential hypertension  Follow up blood pressure at next appointment without changes in medications or dose.  4. Prediabetes Follow up labs in 3 months (once she is normocytic and iron is better replaced).  5. Vitamin D  deficiency We will refill Vit D 50k IU once weekly for 1 month with 0 refills. Repeat labs in 3-4 months.  -Refill Vitamin D, Ergocalciferol, (DRISDOL) 1.25 MG (50000 UNIT) CAPS capsule; Take 1 capsule (50,000 Units total) by mouth every 7 (seven) days.  Dispense: 5 capsule  6. Obesity with BMI of 57.2 Christine [name] is currently in the action stage of change. As such, her goal is to continue with weight loss efforts. She has agreed to the Category 4 Plan +200.  Exercise goals: No exercise has been prescribed at this time.  Behavioral modification strategies: increasing lean protein intake, meal planning and cooking strategies, keeping healthy foods in the home, and planning for success.  Christine [name] has agreed to follow-up with our clinic in 3 weeks. She was informed of the importance of frequent follow-up visits to maximize her success with intensive lifestyle modifications for her multiple health conditions.   Objective:   Blood pressure (!) 145/81, pulse (!) 105, temperature 98.6 F (37 C), height '5\' 4"'$  (1.626 m), weight (!) 333 lb (151 kg), last menstrual period 04/14/2022, SpO2 99 %. Body mass index is 57.16 kg/m.  General: Cooperative, alert, well developed, in no acute distress. HEENT: Conjunctivae and lids unremarkable. Cardiovascular: Regular rhythm.  Lungs: Normal work of breathing. Neurologic: No focal deficits.   Lab Results  Component Value Date   CREATININE 0.95 05/02/2022   BUN 13 05/02/2022   NA 140 05/02/2022   K 3.5 05/02/2022   CL 111 05/02/2022   CO2 23 05/02/2022   Lab Results  Component Value Date   ALT 15 05/02/2022  AST 13 (L) 05/02/2022   ALKPHOS 51 05/02/2022   BILITOT 0.7 05/02/2022   Lab Results  Component Value Date   HGBA1C 5.7 (H) 10/07/2018   Lab Results  Component Value Date   INSULIN 26.8 (H) 05/09/2022   INSULIN 19.5 10/07/2018   Lab Results  Component Value Date   TSH 1.215 08/08/2019   Lab Results  Component Value Date   CHOL 178  05/09/2022   HDL 38 (L) 05/09/2022   LDLCALC 125 (H) 05/09/2022   TRIG 81 05/09/2022   Lab Results  Component Value Date   VD25OH 7.0 (L) 10/07/2018   Lab Results  Component Value Date   WBC 11.2 (H) 05/23/2022   HGB 8.2 (L) 05/23/2022   HCT 28.7 (L) 05/23/2022   MCV 70.3 (L) 05/23/2022   PLT 435 (H) 05/23/2022   Lab Results  Component Value Date   IRON 24 (L) 05/23/2022   TIBC 466 (H) 05/23/2022   FERRITIN 4 (L) 05/23/2022   Attestation Statements:   Reviewed by clinician on day of visit: allergies, medications, problem list, medical history, surgical history, family history, social history, and previous encounter notes.  I, Elnora Morrison, RMA am acting as transcriptionist for Coralie Common, MD. I have reviewed the above documentation for accuracy and completeness, and I agree with the above. - Coralie Common, MD

## 2022-05-30 ENCOUNTER — Telehealth: Payer: Self-pay | Admitting: Nurse Practitioner

## 2022-05-30 NOTE — Telephone Encounter (Signed)
Per 10/26 phone line pt called to r/s appointment   Appointment moved per pt request

## 2022-05-31 ENCOUNTER — Ambulatory Visit: Payer: Medicare Other

## 2022-06-07 ENCOUNTER — Inpatient Hospital Stay: Payer: Medicare Other | Attending: Nurse Practitioner

## 2022-06-07 ENCOUNTER — Other Ambulatory Visit: Payer: Self-pay

## 2022-06-07 VITALS — BP 116/53 | HR 81 | Temp 99.1°F | Resp 20

## 2022-06-07 DIAGNOSIS — D500 Iron deficiency anemia secondary to blood loss (chronic): Secondary | ICD-10-CM

## 2022-06-07 DIAGNOSIS — D509 Iron deficiency anemia, unspecified: Secondary | ICD-10-CM | POA: Diagnosis not present

## 2022-06-07 DIAGNOSIS — Z79899 Other long term (current) drug therapy: Secondary | ICD-10-CM | POA: Insufficient documentation

## 2022-06-07 MED ORDER — SODIUM CHLORIDE 0.9% FLUSH: 3.0000 mL | Freq: Once | INTRAVENOUS | Status: DC | PRN

## 2022-06-07 MED ORDER — HEPARIN SOD (PORK) LOCK FLUSH 100 UNIT/ML IV SOLN: 500.0000 [IU] | Freq: Once | INTRAVENOUS | Status: DC | PRN

## 2022-06-07 MED ORDER — SODIUM CHLORIDE 0.9 % IV SOLN
300.0000 mg | Freq: Once | INTRAVENOUS | Status: AC
  Administered 2022-06-07: 300 mg via INTRAVENOUS
  Filled 2022-06-07: qty 300

## 2022-06-07 MED ORDER — HEPARIN SOD (PORK) LOCK FLUSH 100 UNIT/ML IV SOLN: 250.0000 [IU] | Freq: Once | INTRAVENOUS | Status: DC | PRN

## 2022-06-07 MED ORDER — SODIUM CHLORIDE 0.9% FLUSH: 10.0000 mL | INTRAVENOUS | Status: DC | PRN

## 2022-06-07 MED ORDER — ALTEPLASE 2 MG IJ SOLR: 2.0000 mg | Freq: Once | INTRAMUSCULAR | Status: DC | PRN

## 2022-06-07 MED ORDER — SODIUM CHLORIDE 0.9 % IV SOLN: Freq: Once | INTRAVENOUS | Status: AC

## 2022-06-07 NOTE — Progress Notes (Signed)
Patient declined to remain for 30 minute post iron observation period.

## 2022-06-07 NOTE — Patient Instructions (Signed)

## 2022-06-14 ENCOUNTER — Inpatient Hospital Stay: Payer: Medicare Other

## 2022-06-14 ENCOUNTER — Ambulatory Visit: Payer: Medicare Other

## 2022-06-14 DIAGNOSIS — F3131 Bipolar disorder, current episode depressed, mild: Secondary | ICD-10-CM | POA: Diagnosis not present

## 2022-06-14 DIAGNOSIS — Z79899 Other long term (current) drug therapy: Secondary | ICD-10-CM | POA: Diagnosis not present

## 2022-06-14 DIAGNOSIS — F411 Generalized anxiety disorder: Secondary | ICD-10-CM | POA: Diagnosis not present

## 2022-06-18 ENCOUNTER — Telehealth (HOSPITAL_COMMUNITY): Payer: Self-pay | Admitting: Professional

## 2022-06-19 ENCOUNTER — Ambulatory Visit (HOSPITAL_COMMUNITY): Payer: Medicare Other | Admitting: Professional

## 2022-06-19 ENCOUNTER — Ambulatory Visit (INDEPENDENT_AMBULATORY_CARE_PROVIDER_SITE_OTHER): Payer: Medicare Other | Admitting: Family Medicine
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
## 2022-06-19 NOTE — Plan of Care (Signed)
  Problem: Depression CCP Problem  1  Goal: STG: Christine "[name]" WILL ATTEND AT LEAST 80% OF SCHEDULED PHP SESSIONS Outcome: Not Applicable Goal: STG: Christine "[name]" WILL COMPLETE AT LEAST 80% OF ASSIGNED HOMEWORK Outcome: Not Applicable Goal: STG: Reduce overall depression score by a minimum of 25% on the Patient Health Questionnaire (PHQ-9) or the Montgomery-Asberg Depression Rating Scale (MADRS) Outcome: Not Applicable Goal: STG: Christine "[name]" WILL IDENTIFY AT LEAST 3 COGNITIVE PATTERNS AND BELIEFS THAT SUPPORT DEPRESSION Outcome: Not Applicable   Pt verbally agrees to treatment plan.

## 2022-06-24 ENCOUNTER — Other Ambulatory Visit (HOSPITAL_COMMUNITY): Payer: Medicare Other | Attending: Psychiatry

## 2022-06-24 ENCOUNTER — Other Ambulatory Visit (HOSPITAL_COMMUNITY): Payer: Medicare Other | Attending: Psychiatry | Admitting: Licensed Clinical Social Worker

## 2022-06-24 DIAGNOSIS — F3162 Bipolar disorder, current episode mixed, moderate: Secondary | ICD-10-CM | POA: Diagnosis not present

## 2022-06-24 DIAGNOSIS — F32A Depression, unspecified: Secondary | ICD-10-CM | POA: Diagnosis present

## 2022-06-24 DIAGNOSIS — G4700 Insomnia, unspecified: Secondary | ICD-10-CM | POA: Insufficient documentation

## 2022-06-24 DIAGNOSIS — F314 Bipolar disorder, current episode depressed, severe, without psychotic features: Secondary | ICD-10-CM | POA: Insufficient documentation

## 2022-06-24 DIAGNOSIS — F411 Generalized anxiety disorder: Secondary | ICD-10-CM | POA: Diagnosis not present

## 2022-06-24 DIAGNOSIS — F319 Bipolar disorder, unspecified: Secondary | ICD-10-CM | POA: Insufficient documentation

## 2022-06-24 DIAGNOSIS — G4733 Obstructive sleep apnea (adult) (pediatric): Secondary | ICD-10-CM | POA: Diagnosis not present

## 2022-06-24 DIAGNOSIS — R4589 Other symptoms and signs involving emotional state: Secondary | ICD-10-CM

## 2022-06-24 NOTE — Progress Notes (Signed)
Psychiatric Initial Adult Assessment   Virtual Visit via Video Note  I connected with Christine [name] on 06/24/22 at  9:00 AM EST by a video enabled telemedicine application and verified that I am speaking with the correct person using two identifiers.  Location: Patient: Home Provider: [facility]   I discussed the limitations of evaluation and management by telemedicine and the availability of in person appointments. The patient expressed understanding and agreed to proceed.    Patient Identification: Christine [name] MRN:  [id number] Date of Evaluation:  06/24/2022 Referral Source:  Chief Complaint:   Chief Complaint  Patient presents with   Establish Care   Depression   Visit Diagnosis:    ICD-10-CM   1. Bipolar 1 disorder, depressed, severe ([place])  F31.4     2. Generalized anxiety disorder  F41.1       History of Present Illness:   Christine [name] is a 46 yr old female who presents via Virtual Video to establish care and for medication management, she enrolled in the [facility] Program 06/24/2022.  PPHx is significant for Bipolar Disorder and Anxiety, a history of Self Injurious Behavior (cutting- last 2020), and 4 Psychiatric Hospitalizations (last [facility] 09/2018), and no history of Suicide Attempts.  She reports that her mood has been down the last few months but specifically worsening about 2 to 3 weeks ago.  She reports thoughts of SI have started and been worsening.  She reports also having urges to self-harm but that she has not.  She reports that whenever this happens she thinks of her kids to dispel these thoughts.  She reports she met with her outpatient provider last week and did make med changes.  She reports that she has done the partial hospitalization program in the past and it was helpful and so since she was having these issues now she wanted to enroll in the program to prevent worsening.  She reports mostly having depressive symptoms- depression, feelings of  hopelessness/worthlessness, decreased sleep, fatigue, and high anxiety.  She reports some manic symptoms of racing thoughts and irritable mood but that these are minor and vastly overshadowed by the depressive symptoms.  She reports past psychiatric history significant for bipolar disorder and anxiety.  She reports history of self-injurious behavior-cutting (last 2020).  She reports no history of suicide attempts.  She reports 4 prior psychiatric hospitalizations the latest being [facility] 09/2018.  She reports she currently is seeing at [facility] and is working on getting a therapist.  She reports she is currently on Caplyta, lithium, Lamictal, and Klonopin.  She reports past medical history significant for hypertension, anemia, vitamin D deficiency, migraines, OSA, and insulin resistance.  She reports no history of head trauma or seizures.  She reports allergy to penicillin-rash.  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  She reports she has been on disability since November 2019.  She reports currently lives in a house with her husband and 2 daughters (14, 110).  She reports no alcohol use.  She reports no tobacco use.  She reports no substance use.  She reports no current legal issues.  She reports no access to firearms.  She reports that she has noticed some improvement since being started on the [place].  She reports that her lithium level has always been low so she would be willing to have this increased.  She reports she had a lithium level lab done last week.  Asked her to bring a copy  of this result as an adjustment would not be able to be made until this could be reviewed.  She was agreeable to this and had no other concerns at present.  Encouraged her to fully engage and participate in the [facility] program.  She reports no SI today, HI, or AVH.  She reports her sleep is fair.  She reports her appetite is fair.  She reports no other concerns at present.    Associated  Signs/Symptoms: Depression Symptoms:  depressed mood, anhedonia, fatigue, feelings of worthlessness/guilt, hopelessness, suicidal thoughts without plan, anxiety, disturbed sleep, Appetite goes up for a few days then down for a few days in cycles (Hypo) Manic Symptoms:  Distractibility, Elevated Mood, Flight of Ideas, Irritable Mood, Anxiety Symptoms:  Excessive Worry, Psychotic Symptoms:   Reports None PTSD Symptoms: Re-experiencing:  None Hypervigilance:  No Hyperarousal:  None Avoidance:  None  Past Psychiatric History: Bipolar Disorder and Anxiety, a history of Self Injurious Behavior (cutting- last 2020), and 4 Psychiatric Hospitalizations (last [facility] 09/2018), and no history of Suicide Attempts.  Previous Psychotropic Medications: Yes Caplyta, lithium, Lamictal, Klonopin, Vraylar, Abilify, Risperdal, Seroquel, Depakote, Wellbutrin.  Substance Abuse History in the last 12 months:  No.  Consequences of Substance Abuse: NA  Past Medical History:  Past Medical History:  Diagnosis Date   Anemia    Presumably from menorrhagia/menomenorrhagia; has had both blood and iron transfusions   Anxiety    Back pain    Bipolar 1 disorder ([place])    With depression   Chest pain    Depression    Diastolic heart failure ([place])    Essential hypertension    Fibromyalgia    Headache    History of blood transfusion 08/2017   [facility]   History of hiatal hernia    IBS (irritable bowel syndrome)    Infertility, female    Infertility, female    Lactose intolerance    Lower extremity edema    Migraines    Morbid obesity (HCC)    Osteoarthritis    Hands, ankles; HLA-B27 positive   PCOS (polycystic ovarian syndrome)    Prediabetes    Seasonal allergies    With recurrent allergic rhinitis   Sleep apnea    SOB (shortness of breath)    Uterine leiomyoma    Vitamin D deficiency     Past Surgical History:  Procedure Laterality Date   CARDIAC CT ANGIOGRAM  03/2019    Ca Score 0. Normal  Coronary origin R dominant. No evidence of CAD. ? Liver nodules - poorly visualized (consider MRI w & w/o Gad contrast)   CERVICAL POLYPECTOMY  01/15/2018   Procedure: CERVICAL POLYPECTOMY;  Surgeon: Arvella Nigh, MD;  Location: [facility];  Service: Gynecology;;   COLONOSCOPY  2008   Normal   DIAGNOSTIC LAPAROSCOPY  08/1999   dermoid cyst, [facility]   DILATION AND CURETTAGE OF UTERUS  05/2004   MAB   DILATION AND CURETTAGE OF UTERUS  04/2015   HYSTEROSCOPY WITH D & C  07/22/2011   Procedure: DILATATION AND CURETTAGE /HYSTEROSCOPY;  Surgeon: Cyril Mourning, MD;  Location: [facility];  Service: Gynecology;;   HYSTEROSCOPY WITH D & C N/A 01/15/2018   Procedure: DILATATION AND CURETTAGE [place] WITH MYOSURE;  Surgeon: Arvella Nigh, MD;  Location: [facility];  Service: Gynecology;  Laterality: N/A;   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  Suggestion of high  cardiac output (possibly related to anemia thyrotoxicosis, Pregnancy, sepsis etc.) -- was in setting of symptomatic anemia - Hgb 5.7    Family Psychiatric History: Mother- Depression, Anxiety No Known history of Substance Abuse or Suicides.  Family History:  Family History  Problem Relation Age of Onset   Anxiety disorder Mother    Depression Mother    Cancer Mother        pancreatic    High blood pressure Mother    Hypertension Mother    Hyperlipidemia Mother    Cancer Father        colon   High blood pressure Father    Diabetes Father    Hypertension Father    Hyperlipidemia Father    Heart disease Father    Stroke Father    Kidney disease Father    Sleep apnea Father    Obesity Father    Cancer Brother 6       leukemia, childhood   High blood pressure Brother    Migraines Neg Hx     Social History:   Social History   Socioeconomic History   Marital status: Married    Spouse name: Catalina Salasar   Number of children: 2   Years of  education: 2 years of grad school + bach & HS   Highest education level: Bachelor's degree (e.g., BA, AB, BS)  Occupational History   Occupation: disabled   Occupation: Part time Naval architect  Tobacco Use   Smoking status: Never   Smokeless tobacco: Never  Vaping Use   Vaping Use: Never used  Substance and Sexual Activity   Alcohol use: Yes    Comment: occasional, maybe once a month or so   Drug use: No   Sexual activity: Yes    Partners: Male    Birth control/protection: None  Other Topics Concern   Not on file  Social History Narrative   ** Merged History Encounter **       Lives at home with her husband and 2 adopted daughters. Right handed Caffeine: decreased intake, drinks rarely  Is on long-term disability.   Social Determinants of Health   Financial Resource Strain: Medium Risk (09/10/2017)   Overall Financial Resource Strain (CARDIA)    Difficulty of Paying Living Expenses: Somewhat hard  Food Insecurity: Food Insecurity Present (09/10/2017)   Hunger Vital Sign    Worried About Running Out of Food in the Last Year: Sometimes true    Ran Out of Food in the Last Year: Sometimes true  Transportation Needs: No Transportation Needs (09/10/2017)   PRAPARE - Hydrologist (Medical): No    Lack of Transportation (Non-Medical): No  Physical Activity: Unknown (09/10/2017)   Exercise Vital Sign    Days of Exercise per Week: 0 days    Minutes of Exercise per Session: Not on file  Stress: Stress Concern Present (09/10/2017)   [place]    Feeling of Stress : Rather much  Social Connections: Unknown (09/10/2017)   Social Connection and Isolation Panel [NHANES]    Frequency of Communication with Friends and Family: Never    Frequency of Social Gatherings with Friends and Family: Never    Attends Religious Services: Never    Marine scientist or Organizations: No    Attends English as a second language teacher Meetings: Never    Marital Status: Not on file    Additional Social History: None  Allergies:   Allergies  Allergen Reactions   Penicillins Rash and Other (See Comments)    Has patient had a PCN reaction causing immediate rash, facial/tongue/throat swelling, SOB or lightheadedness with hypotension: Yes Has patient had a PCN reaction causing severe rash involving mucus membranes or skin necrosis: Unknown Has patient had a PCN reaction that required hospitalization: No Has patient had a PCN reaction occurring within the last 10 years: No If all of the above answers are "NO", then may proceed with Cephalosporin use.     Metabolic Disorder Labs: Lab Results  Component Value Date   HGBA1C 5.7 05/02/2022   No results found for: "PROLACTIN" Lab Results  Component Value Date   CHOL 178 05/09/2022   TRIG 81 05/09/2022   HDL 38 (L) 05/09/2022   LDLCALC 125 (H) 05/09/2022   LDLCALC 131 (H) 10/07/2018   Lab Results  Component Value Date   TSH 1.55 05/02/2022    Therapeutic Level Labs: Lab Results  Component Value Date   LITHIUM 0.12 (L) 08/07/2019   No results found for: "CBMZ" No results found for: "VALPROATE"  Current Medications: Current Outpatient Medications  Medication Sig Dispense Refill   CAPLYTA 42 MG capsule Take 42 mg by mouth daily.     gabapentin (NEURONTIN) 100 MG capsule Take 100 mg by mouth 3 (three) times daily.     acetaminophen (TYLENOL) 325 MG tablet Take 650 mg by mouth every 6 (six) hours as needed for mild pain or headache.     amLODipine (NORVASC) 5 MG tablet Take 1 tablet (5 mg total) by mouth daily. 30 tablet 0   chlorthalidone (HYGROTON) 25 MG tablet TAKE 1/2 TABLET(12.5 MG) BY MOUTH DAILY 45 tablet 0   clonazePAM (KLONOPIN) 0.5 MG tablet Take 1 tablet (0.5 mg total) by mouth daily as needed for anxiety. 30 tablet 0   fluticasone (FLONASE) 50 MCG/ACT nasal spray 1 spray in each nostril     lamoTRIgine (LAMICTAL) 150 MG tablet Take  1 tablet (150 mg total) by mouth 2 (two) times daily. For mood 60 tablet 2   lithium carbonate (LITHOBID) 300 MG CR tablet Take 1 tablet (300 mg total) by mouth 2 (two) times daily. 60 tablet 2   metFORMIN (GLUCOPHAGE) 500 MG tablet Take 1 tablet (500 mg total) by mouth 2 (two) times daily with a meal. 60 tablet 0   metoprolol succinate (TOPROL-XL) 100 MG 24 hr tablet TAKE 1 TABLET BY MOUTH DAILY WITH OR IMMEDIATELY FOLLOWING A MEAL 90 tablet 1   Vitamin D, Ergocalciferol, (DRISDOL) 1.25 MG (50000 UNIT) CAPS capsule Take 1 capsule (50,000 Units total) by mouth every 7 (seven) days. 5 capsule    No current facility-administered medications for this visit.    Musculoskeletal: Strength & Muscle Tone: within normal limits Gait & Station:  sitting during interview Patient leans: N/A  Psychiatric Specialty Exam: Review of Systems  Respiratory:  Negative for shortness of breath.   Cardiovascular:  Negative for chest pain.  Gastrointestinal:  Negative for abdominal pain, constipation, diarrhea, nausea and vomiting.  Neurological:  Negative for dizziness, weakness and headaches.  Psychiatric/Behavioral:  Positive for dysphoric mood. Negative for agitation, decreased concentration, hallucinations, sleep disturbance and suicidal ideas. The patient is nervous/anxious.     There were no vitals taken for this visit.There is no height or weight on file to calculate BMI.  General Appearance: Casual and Fairly Groomed  Eye Contact:  Good  Speech:  Clear and Coherent and Normal Rate  Volume:  Normal  Mood:  Anxious  and Depressed  Affect:  Congruent and Depressed  Thought Process:  Coherent and Goal Directed  Orientation:  Full (Time, Place, and Person)  Thought Content:  WDL and Logical  Suicidal Thoughts:  No  Homicidal Thoughts:  No  Memory:  Immediate;   Good Recent;   Good  Judgement:  Good  Insight:  Good  Psychomotor Activity:  Normal  Concentration:  Concentration: Good and Attention  Span: Good  Recall:  Good  Fund of Knowledge:Good  Language: Good  Akathisia:  Negative  Handed:  Right  AIMS (if indicated):  not done  Assets:  Communication Skills Desire for Improvement Financial Resources/Insurance Housing Resilience Social Support  ADL's:  Intact  Cognition: WNL  Sleep:  Fair   Screenings: AIMS    [place] Admission (Discharged) from 09/08/2018 in [facility] [facility]  AIMS Total Score 0      AUDIT    Flowsheet Row Admission (Discharged) from 09/08/2018 in [facility] [facility]  Alcohol Use Disorder Identification Test Final Score (AUDIT) 1      GAD-7    Flowsheet Row Counselor from 10/05/2018 in [place] Counselor from 10/02/2018 in [place] Counselor from 09/09/2017 in [place] Counselor from 04/04/2017 in [place] Counselor from 03/18/2017 in [place]  Total GAD-7 Score _0 PHQ2-9    Flowsheet Row Counselor from 06/19/2022 in [place] Visit from 05/09/2022 in [place] Video Visit from 06/12/2021 in [facility] Counselor from 11/30/2020 in [place] Office Visit from 10/07/2018 in [place]  PHQ-2 Total Score _1 PHQ-9 Total Score _2 -- 18      Flowsheet Row Counselor from 06/19/2022 in [place] [facility] from 05/02/2022 in [place] [facility] Counselor from 06/15/2021 in [place] No Risk Error: Question 6 not populated       Assessment and Plan:  Christine [name] is a 46 yr  old female who presents via Virtual Video to establish care and for medication management, she enrolled in the [facility] Program 06/24/2022.  PPHx is significant for Bipolar Disorder and Anxiety, a history of Self Injurious Behavior (cutting- last 2020), and 4 Psychiatric Hospitalizations (last [facility] 09/2018), and no history of Suicide Attempts.   "[name]" has some minor manic symptoms, however, this is not a mixed episode, this is currently a depressed episode.  She will benefit from the [facility] program.  As she was started on Caplyta last week we will not make any other medication changes at this time as she does report improvement in her symptoms.  She reports she just had a Lithium level done so requested she provide these results.  Depending on results and her progression could consider further increase in Lithium.  We will continue to monitor.   Bipolar Disorder, current episode Depressed: -Continue Caplyta 42 mg daily for mood stability. No refills sent at this time. -Continue Lamictal 150 mg BID for mood stability.  No refills sent at this time. -Continue Lithium 300 mg BID for depression and mood stability.  No refills sent at this time. -Continue Klonopin 0.5 mg daily PRN for severe anxiety.  No refills sent at this  time. -Engage in [place] of Care: Other PHP Program  Patient/Guardian was advised Release of Information must be obtained prior to any record release in order to collaborate their care with an outside provider. Patient/Guardian was advised if they have not already done so to contact the registration department to sign all necessary forms in order for [place] to release information regarding their care.   Consent: Patient/Guardian gives verbal consent for treatment and assignment of benefits for services provided during this visit. Patient/Guardian expressed understanding and agreed to proceed.   [name], MD 11/20/202312:55 PM      Follow Up  Instructions:    I discussed the assessment and treatment plan with the patient. The patient was provided an opportunity to ask questions and all were answered. The patient agreed with the plan and demonstrated an understanding of the instructions.   The patient was advised to call back or seek an in-person evaluation if the symptoms worsen or if the condition fails to improve as anticipated.  I provided 28 minutes of non-face-to-face time during this encounter.   [name], MD

## 2022-06-25 ENCOUNTER — Encounter (HOSPITAL_COMMUNITY): Payer: Self-pay

## 2022-06-25 ENCOUNTER — Other Ambulatory Visit (HOSPITAL_COMMUNITY): Payer: Medicare Other

## 2022-06-25 ENCOUNTER — Other Ambulatory Visit (HOSPITAL_COMMUNITY): Payer: Medicare Other | Admitting: Licensed Clinical Social Worker

## 2022-06-25 ENCOUNTER — Encounter (HOSPITAL_COMMUNITY): Payer: Self-pay | Admitting: Psychiatry

## 2022-06-25 DIAGNOSIS — F3162 Bipolar disorder, current episode mixed, moderate: Secondary | ICD-10-CM | POA: Diagnosis not present

## 2022-06-25 DIAGNOSIS — R4589 Other symptoms and signs involving emotional state: Secondary | ICD-10-CM

## 2022-06-25 DIAGNOSIS — F411 Generalized anxiety disorder: Secondary | ICD-10-CM | POA: Diagnosis not present

## 2022-06-25 DIAGNOSIS — F314 Bipolar disorder, current episode depressed, severe, without psychotic features: Secondary | ICD-10-CM | POA: Diagnosis not present

## 2022-06-25 DIAGNOSIS — F319 Bipolar disorder, unspecified: Secondary | ICD-10-CM | POA: Diagnosis not present

## 2022-06-25 NOTE — Therapy (Signed)
[facility] [place] [place], [place], [address] Phone: [phone]   Fax:  [phone]  Occupational Therapy Evaluation Virtual Visit via Video Note  I connected with Christine [name] on 06/24/22 at  8:00 AM EST by a video enabled telemedicine application and verified that I am speaking with the correct person using two identifiers.  Location: Patient: home Provider: office   I discussed the limitations of evaluation and management by telemedicine and the availability of in person appointments. The patient expressed understanding and agreed to proceed.    The patient was advised to call back or seek an in-person evaluation if the symptoms worsen or if the condition fails to improve as anticipated.  I provided 55 minutes of non-face-to-face time during this encounter.   Patient Details  Name: Christine [name] MRN: [id number] Date of Birth: 01-01-1976 No data recorded  Encounter Date: 06/24/2022   OT End of Session - 06/24/22 1021     Visit Number 1    Number of Visits 20    Date for OT Re-Evaluation 07/25/22    OT Start Time 0930    OT Stop Time 6301   Eval: 71; Tx: 55; Total Minutes: 85   OT Time Calculation (min) 205 min    Activity Tolerance Patient tolerated treatment well    Behavior During Therapy WFL for tasks assessed/performed             Past Medical History:  Diagnosis Date   Anemia    Presumably from menorrhagia/menomenorrhagia; has had both blood and iron transfusions   Anxiety    Back pain    Bipolar 1 disorder ([place])    With depression   Chest pain    Depression    Diastolic heart failure ([place])    Essential hypertension    Fibromyalgia    Headache    History of blood transfusion 08/2017   [place]   History of hiatal hernia    IBS (irritable bowel syndrome)    Infertility, female    Infertility, female    Lactose intolerance    Lower extremity edema    Migraines    Morbid obesity  (HCC)    Osteoarthritis    Hands, ankles; HLA-B27 positive   PCOS (polycystic ovarian syndrome)    Prediabetes    Seasonal allergies    With recurrent allergic rhinitis   Sleep apnea    SOB (shortness of breath)    Uterine leiomyoma    Vitamin D deficiency     Past Surgical History:  Procedure Laterality Date   CARDIAC CT ANGIOGRAM  03/2019    Ca Score 0. Normal Coronary origin R dominant. No evidence of CAD. ? Liver nodules - poorly visualized (consider MRI w & w/o Gad contrast)   CERVICAL POLYPECTOMY  01/15/2018   Procedure: CERVICAL POLYPECTOMY;  Surgeon: Arvella Nigh, MD;  Location: [facility];  Service: Gynecology;;   COLONOSCOPY  2008   Normal   DIAGNOSTIC LAPAROSCOPY  08/1999   dermoid cyst, RSO   DILATION AND CURETTAGE OF UTERUS  05/2004   MAB   DILATION AND CURETTAGE OF UTERUS  04/2015   HYSTEROSCOPY WITH D & C  07/22/2011   Procedure: DILATATION AND CURETTAGE /HYSTEROSCOPY;  Surgeon: Cyril Mourning, MD;  Location: [facility];  Service: Gynecology;;   HYSTEROSCOPY WITH D & C N/A 01/15/2018   Procedure: DILATATION AND CURETTAGE [place] WITH MYOSURE;  Surgeon: Arvella Nigh, MD;  Location: [facility];  Service: Gynecology;  Laterality: N/A;   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  Suggestion of high cardiac output (possibly related to anemia thyrotoxicosis, Pregnancy, sepsis etc.) -- was in setting of symptomatic anemia - Hgb 5.7    There were no vitals filed for this visit.   Subjective Assessment - 06/25/22 1019     Pertinent History Hx of PHP participation in recent past    Patient Stated Goals Improve Sx mgmt in order to improve daily psychosocial functional performance    Currently in Pain? No/denies    Pain Score 0-No pain               Group Session:  S: Looking forward to learning new skills to help me in my routines, goals and overall coping skills.   O: During  today's OT group session, the patient participated in an educational segment about the importance of goal-setting and the application of the SMART framework to enhance daily life, particularly focusing on ADLs and iADLs. The session began with five open-ended pre-session questions that facilitated group discussion and introspection about their current relationship with goals. Following the introduction and educational segment, participants engaged in brainstorming and group discussions to devise hypothetical SMART goals. The session concluded with five post-session questions to reinforce understanding and facilitate reflection. Throughout the session, there was a range of engagement levels noted among the participants.   A:  Patient demonstrated a high level of engagement throughout the session. They actively participated in discussions, sharing personal experiences related to goal setting and challenges faced. Patient was able to clearly articulate an understanding of the SMART framework and proposed personal SMART goals related to their own ADLs with minimal assistance. They expressed enthusiasm about applying what they learned to their daily routine and appeared motivated to make changes.   P: Continue to attend PHP OT group sessions 5x week for 4 weeks to promote daily structure, social engagement, and opportunities to develop and utilize adaptive strategies to maximize functional performance in preparation for safe transition and integration back into school, work, and the community. Plan to address topic of tbd in next OT group session.   OT Assessment  Diagnosis: BPD, MDD Past medical history/referral information: Hx of PHP participation Living situation: spouse / children x2 ADLs: Independent Work: part time / 10 hrs/wk; disability benefits Leisure: inhibited Social support:  inhibited Struggles: coping, routines, goals and interests OT goal:  Improve inhibited psychosocial domains to  allow for improved occupational performance in these respective areas  [place] Summary of Client Scores:  Facilitates participation in occupation Allows participation in occupation Inhibits participation in occupation Restricts participation in occupation Comments:  Roles   X    Habits   X    Personal Causation    X   Values  X     Interests    X   Skills   X    [place]   X    Long-term Goals   X    Interpretation of Past Experiences   X    Physical Environment   X    [place]  X     Readiness for Change   X      Need for Occupational Therapy:  4 Shows positive occupational participation, no need for OT.   3 Need for minimal intervention/consultative participation   2 Need for OT intervention indicated to  restore/improve participation   1 Need for extensive OT intervention indicated to improve participation.  Referral for follow up services also recommended.   Assessment:  Patient demonstrates behavior that INHIBIT participation in occupation.  Patient will benefit from occupational therapy intervention in order to improve time management, financial management, stress management, job readiness skills, social skills, and health management skills in preparation to return to full time community living and to be a productive community member.    Plan:  Patient will participate in skilled occupational therapy sessions individually or in a group setting to improve coping skills, psychosocial skills, and emotional skills required to return to prior level of function. Treatment will be 4-5 times per week for 4 weeks.                      OT Education - 06/24/22 1020     Education Details OT Eval / Renelda Mom / SMART Goals group    Person(s) Educated Patient    Comprehension Verbalized understanding              OT Short Term Goals - 06/24/22 1025       OT SHORT TERM GOAL #1   Title Patient will be educated on strategies to  improve psychosocial skills needed to participate fully in all daily, work, and leisure activities.    Time 4    Period Weeks    Status On-going    Target Date 07/25/22      OT SHORT TERM GOAL #2   Title Pt will apply psychosocial skills and coping mechanisms to daily activities in order to function independently and reintegrate into community dwelling      OT [place] #3   Title Pt will recall and/or apply 1-3 sleep hygiene strategies to improve BADL routine upon reintegrating into community      OT [place] #4   Title Pt will choose and/or engage in 1-3 socially engaging leisure activities to improve social participation skills upon reintegrating into community      OT [place] #5   Title Pt will engage in goal setting to improve functional BADL/IADL routine upon reintegrating into community                      Plan - 06/24/22 1022     Clinical Impression Statement Pt presents w/ deficits across multiple psychosocial domains that inhibits her ability to engage in her daily tasks w/o difficulty or inbitition per OCAIRS results (I).    OT Occupational Profile and History Problem Focused Assessment - Including review of records relating to presenting problem    Occupational performance deficits (Please refer to evaluation for details): IADL's;Social Participation;Rest and Sleep;Work;Leisure    Psychosocial Skills Coping Strategies;Habits;Interpersonal Interaction;Routines and Behaviors    Rehab Potential Good    Clinical Decision Making Limited treatment options, no task modification necessary    Comorbidities Affecting Occupational Performance: None    Modification or Assistance to Complete Evaluation  No modification of tasks or assist necessary to complete eval    OT Frequency 4x / week    OT Duration 4 weeks    OT Treatment/Interventions Coping strategies training;Psychosocial skills training    Consulted and Agree with Plan of Care Patient              Patient will benefit from skilled therapeutic intervention in order to improve the following deficits and impairments:       Psychosocial  Skills: Coping Strategies, Habits, Interpersonal Interaction, Routines and Behaviors   Visit Diagnosis: Difficulty coping    Problem List Patient Active Problem List   Diagnosis Date Noted   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   Acute on chronic diastolic congestive heart failure ([place]) 62/22/9798   Acute diastolic heart failure ([place])    Pulmonary edema 08/07/2019   Sinus tachycardia 05/17/2019   Atypical chest pain 02/11/2019   Pulmonary hypertension, unspecified ([place]) 02/11/2019   Functional systolic murmur 92/06/9416   Inadequate sleep hygiene 12/08/2018   Psychophysiological insomnia 12/08/2018   Obesity hypoventilation syndrome ([place]) 12/08/2018   OSA (obstructive sleep apnea) 12/08/2018   Chronic migraine without aura without status migrainosus, not intractable 09/21/2018   Bipolar 1 disorder, depressed, severe ([place]) 09/08/2018   Endometrium, polyp 01/15/2018    Class: Present on Admission   Fibroids, submucosal 01/15/2018    Class: Present on Admission   Iron deficiency anemia due to chronic blood loss 09/16/2017   Menorrhagia with regular cycle    Symptomatic anemia 08/28/2017   Acute blood loss anemia 08/28/2017   Bipolar disorder ([place]) 08/28/2017   Anxiety 08/28/2017   Depression with anxiety 08/28/2017   Fibroids 08/28/2017   PCOS (polycystic ovarian syndrome) 08/28/2017   Fever 08/28/2017   HTN (hypertension) 08/28/2017   Morbid obesity ([place]) 12/04/2015   Pregnancy with history of uterine myomectomy 04/07/2015   History of right oophorectomy 02/21/2015   History of recurrent miscarriages 02/01/2015   Vitamin D deficiency 02/01/2015    Christine [name], OT 06/25/2022, 10:30 AM  Christine [name], [place], [place], [address] Phone: [phone]   Fax:  [phone]  Name: Christine [name] MRN: [id number] Date of Birth: Feb 22, 1976

## 2022-06-26 ENCOUNTER — Other Ambulatory Visit (HOSPITAL_COMMUNITY): Payer: Medicare Other

## 2022-06-26 ENCOUNTER — Encounter (HOSPITAL_COMMUNITY): Payer: Self-pay

## 2022-06-26 ENCOUNTER — Other Ambulatory Visit (HOSPITAL_COMMUNITY): Payer: Medicare Other | Admitting: Licensed Clinical Social Worker

## 2022-06-26 DIAGNOSIS — F3162 Bipolar disorder, current episode mixed, moderate: Secondary | ICD-10-CM | POA: Diagnosis not present

## 2022-06-26 DIAGNOSIS — F319 Bipolar disorder, unspecified: Secondary | ICD-10-CM | POA: Diagnosis not present

## 2022-06-26 DIAGNOSIS — F411 Generalized anxiety disorder: Secondary | ICD-10-CM | POA: Diagnosis not present

## 2022-06-26 DIAGNOSIS — F314 Bipolar disorder, current episode depressed, severe, without psychotic features: Secondary | ICD-10-CM | POA: Diagnosis not present

## 2022-06-26 DIAGNOSIS — R4589 Other symptoms and signs involving emotional state: Secondary | ICD-10-CM

## 2022-06-26 NOTE — Therapy (Signed)
[facility] [place], [place], [address] Phone: [phone]   Fax:  [phone]  Occupational Therapy Treatment Virtual Visit via Video Note  I connected with [name] on 06/26/22 at  8:00 AM EST by a video enabled telemedicine application and verified that I am speaking with the correct person using two identifiers.  Location: Patient: home Provider: office   I discussed the limitations of evaluation and management by telemedicine and the availability of in person appointments. The patient expressed understanding and agreed to proceed.    The patient was advised to call back or seek an in-person evaluation if the symptoms worsen or if the condition fails to improve as anticipated.  I provided 55 minutes of non-face-to-face time during this encounter.  Patient Details  Name: Christine [name] MRN: [id number] Date of Birth: [birth year] No data recorded  Encounter Date: 06/25/2022   OT End of Session - 06/26/22 0920     Visit Number 2    Number of Visits 20    Date for OT Re-Evaluation 07/25/22    OT Start Time 1200    OT Stop Time 1255    OT Time Calculation (min) 55 min             Past Medical History:  Diagnosis Date   Anemia    Presumably from menorrhagia/menomenorrhagia; has had both blood and iron transfusions   Anxiety    Back pain    Bipolar 1 disorder ([place])    With depression   Chest pain    Depression    Diastolic heart failure ([place])    Essential hypertension    Fibromyalgia    Headache    History of blood transfusion 08/2017   WL   History of hiatal hernia    IBS (irritable bowel syndrome)    Infertility, female    Infertility, female    Lactose intolerance    Lower extremity edema    Migraines    Morbid obesity ([place])    Osteoarthritis    Hands, ankles; HLA-B27 positive   PCOS (polycystic ovarian syndrome)    Prediabetes    Seasonal allergies    With recurrent  allergic rhinitis   Sleep apnea    SOB (shortness of breath)    Uterine leiomyoma    Vitamin D deficiency     Past Surgical History:  Procedure Laterality Date   CARDIAC CT ANGIOGRAM  03/2019    Ca Score 0. Normal Coronary origin R dominant. No evidence of CAD. ? Liver nodules - poorly visualized (consider MRI w & w/o Gad contrast)   CERVICAL POLYPECTOMY  01/15/2018   Procedure: CERVICAL POLYPECTOMY;  Surgeon: Arvella Nigh, MD;  Location: [facility];  Service: Gynecology;;   COLONOSCOPY  2008   Normal   DIAGNOSTIC LAPAROSCOPY  08/1999   dermoid cyst, RSO   DILATION AND CURETTAGE OF UTERUS  05/2004   MAB   DILATION AND CURETTAGE OF UTERUS  04/2015   HYSTEROSCOPY WITH D & C  07/22/2011   Procedure: DILATATION AND CURETTAGE /HYSTEROSCOPY;  Surgeon: Cyril Mourning, MD;  Location: [facility];  Service: Gynecology;;   HYSTEROSCOPY WITH D & C N/A 01/15/2018   Procedure: DILATATION AND CURETTAGE [place] WITH MYOSURE;  Surgeon: Arvella Nigh, MD;  Location: [facility];  Service: Gynecology;  Laterality: N/A;   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  Suggestion of high cardiac output (possibly related to anemia thyrotoxicosis, Pregnancy, sepsis etc.) -- was in setting of symptomatic anemia - Hgb 5.7    There were no vitals filed for this visit.   Subjective Assessment - 06/26/22 0919     Currently in Pain? No/denies    Pain Score 0-No pain                Group Session:  S: doing better today  O: The primary objective of this group therapy session is to equip participants with practical strategies and coping mechanisms to effectively manage accumulated tasks that seem insurmountable due to mental health challenges such as depression and anxiety. The group aims to build a supportive environment wherein individuals can openly discuss their struggles, share experiences, and learn from one another. The  session's strategies will encompass goal setting, time management, task breakdown, creating conducive environments, and mindfulness practices. We aim to help participants perceive tasks as manageable units rather than overwhelming piles and encourage an approach of progress over perfection.   A: Patient demonstrated active engagement throughout the session. They contributed valuable insights during discussions, asked relevant questions, and showed enthusiasm towards learning new strategies. Their interaction with other group members was respectful and empathetic, contributing to a supportive group dynamic. Patient appeared to resonate with the strategies of "breaking it down" and "creating a conducive environment," and they shared plans to incorporate these strategies into their daily routine. Their active participation, willingness to share personal experiences, and acceptance of new strategies demonstrate a strong benefit from this therapy session.    P: Continue to attend PHP OT group sessions 5x week for 4 weeks to promote daily structure, social engagement, and opportunities to develop and utilize adaptive strategies to maximize functional performance in preparation for safe transition and integration back into school, work, and the community. Plan to address topic of pt2 in next OT group session.                   OT Education - 06/26/22 0919     Education Details Task Mgmt              OT Short Term Goals - 06/25/22 1025       OT SHORT TERM GOAL #1   Title Patient will be educated on strategies to improve psychosocial skills needed to participate fully in all daily, work, and leisure activities.    Time 4    Period Weeks    Status On-going    Target Date 07/25/22      OT SHORT TERM GOAL #2   Title Pt will apply psychosocial skills and coping mechanisms to daily activities in order to function independently and reintegrate into community dwelling      OT [place] #3   Title Pt will recall and/or apply 1-3 sleep hygiene strategies to improve BADL routine upon reintegrating into community      OT [place] #4   Title Pt will choose and/or engage in 1-3 socially engaging leisure activities to improve social participation skills upon reintegrating into community      OT [place] #5   Title Pt will engage in goal setting to improve functional BADL/IADL routine upon reintegrating into community                      Plan - 06/26/22 0920     Psychosocial Skills Coping Strategies;Habits;Interpersonal Interaction;Routines and Behaviors  Patient will benefit from skilled therapeutic intervention in order to improve the following deficits and impairments:       Psychosocial Skills: Coping Strategies, Habits, Interpersonal Interaction, Routines and Behaviors   Visit Diagnosis: Difficulty coping    Problem List Patient Active Problem List   Diagnosis Date Noted   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   Acute on chronic diastolic congestive heart failure ([place]) 19/75/8832   Acute diastolic heart failure ([place])    Pulmonary edema 08/07/2019   Sinus tachycardia 05/17/2019   Atypical chest pain 02/11/2019   Pulmonary hypertension, unspecified ([place]) 02/11/2019   Functional systolic murmur 54/98/2641   Inadequate sleep hygiene 12/08/2018   Psychophysiological insomnia 12/08/2018   Obesity hypoventilation syndrome ([place]) 12/08/2018   OSA (obstructive sleep apnea) 12/08/2018   Chronic migraine without aura without status migrainosus, not intractable 09/21/2018   Bipolar 1 disorder, depressed, severe ([place]) 09/08/2018   Endometrium, polyp 01/15/2018    Class: Present on Admission   Fibroids, submucosal 01/15/2018    Class: Present on Admission   Iron deficiency anemia due to chronic blood loss 09/16/2017   Menorrhagia with regular cycle    Symptomatic anemia 08/28/2017    Acute blood loss anemia 08/28/2017   Bipolar disorder ([place]) 08/28/2017   Anxiety 08/28/2017   Depression with anxiety 08/28/2017   Fibroids 08/28/2017   PCOS (polycystic ovarian syndrome) 08/28/2017   Fever 08/28/2017   HTN (hypertension) 08/28/2017   Morbid obesity ([place]) 12/04/2015   Pregnancy with history of uterine myomectomy 04/07/2015   History of right oophorectomy 02/21/2015   History of recurrent miscarriages 02/01/2015   Vitamin D deficiency 02/01/2015    Brantley Stage, OT 06/26/2022, 9:20 AM Cornell Barman, [place], [place], [address] Phone: [phone]   Fax:  [phone]  Name: Nadie Fiumara MRN: [id number] Date of Birth: 08-04-1976

## 2022-06-29 ENCOUNTER — Inpatient Hospital Stay: Payer: Medicare Other

## 2022-06-29 ENCOUNTER — Other Ambulatory Visit: Payer: Self-pay

## 2022-06-29 VITALS — BP 131/79 | HR 85 | Temp 99.9°F | Resp 18

## 2022-06-29 DIAGNOSIS — D509 Iron deficiency anemia, unspecified: Secondary | ICD-10-CM | POA: Diagnosis not present

## 2022-06-29 DIAGNOSIS — Z79899 Other long term (current) drug therapy: Secondary | ICD-10-CM | POA: Diagnosis not present

## 2022-06-29 DIAGNOSIS — D500 Iron deficiency anemia secondary to blood loss (chronic): Secondary | ICD-10-CM

## 2022-06-29 MED ORDER — SODIUM CHLORIDE 0.9 % IV SOLN: Freq: Once | INTRAVENOUS | Status: AC

## 2022-06-29 MED ORDER — SODIUM CHLORIDE 0.9 % IV SOLN
300.0000 mg | Freq: Once | INTRAVENOUS | Status: AC
  Administered 2022-06-29: 300 mg via INTRAVENOUS
  Filled 2022-06-29: qty 15

## 2022-06-29 NOTE — Patient Instructions (Signed)

## 2022-06-29 NOTE — Progress Notes (Signed)
Patient declined to stay for post venofer monitoring. Patient ambulatory and VSS at discharge.

## 2022-07-01 ENCOUNTER — Other Ambulatory Visit (HOSPITAL_COMMUNITY): Payer: Medicare Other | Admitting: Licensed Clinical Social Worker

## 2022-07-01 ENCOUNTER — Encounter (HOSPITAL_COMMUNITY): Payer: Self-pay

## 2022-07-01 ENCOUNTER — Other Ambulatory Visit (HOSPITAL_COMMUNITY): Payer: Medicare Other

## 2022-07-01 DIAGNOSIS — F3162 Bipolar disorder, current episode mixed, moderate: Secondary | ICD-10-CM

## 2022-07-01 DIAGNOSIS — F314 Bipolar disorder, current episode depressed, severe, without psychotic features: Secondary | ICD-10-CM | POA: Diagnosis not present

## 2022-07-01 DIAGNOSIS — F319 Bipolar disorder, unspecified: Secondary | ICD-10-CM | POA: Diagnosis not present

## 2022-07-01 DIAGNOSIS — F411 Generalized anxiety disorder: Secondary | ICD-10-CM

## 2022-07-01 DIAGNOSIS — R4589 Other symptoms and signs involving emotional state: Secondary | ICD-10-CM

## 2022-07-01 MED ORDER — LITHIUM CARBONATE ER 300 MG PO TBCR: 300.0000 mg | EXTENDED_RELEASE_TABLET | Freq: Two times a day (BID) | ORAL | 0 refills | Status: DC

## 2022-07-01 NOTE — Therapy (Signed)
[place], [place], [address] Phone: [phone]   Fax:  [phone]  Occupational Therapy Treatment Virtual Visit via Video Note  I connected with Christine [name] on 07/01/22 at  8:00 AM EST by a video enabled telemedicine application and verified that I am speaking with the correct person using two identifiers.  Location: Patient: home Provider: office   I discussed the limitations of evaluation and management by telemedicine and the availability of in person appointments. The patient expressed understanding and agreed to proceed.    The patient was advised to call back or seek an in-person evaluation if the symptoms worsen or if the condition fails to improve as anticipated.  I provided 55 minutes of non-face-to-face time during this encounter.  Patient Details  Name: Christine [name] MRN: [id number] Date of Birth: 1975/11/02 No data recorded  Encounter Date: 06/26/2022   OT End of Session - 07/01/22 1805     Visit Number 3    Number of Visits 20    Date for OT Re-Evaluation 07/25/22    OT Start Time 1200    OT Stop Time 1255    OT Time Calculation (min) 55 min             Past Medical History:  Diagnosis Date   Anemia    Presumably from menorrhagia/menomenorrhagia; has had both blood and iron transfusions   Anxiety    Back pain    Bipolar 1 disorder ([place])    With depression   Chest pain    Depression    Diastolic heart failure ([place])    Essential hypertension    Fibromyalgia    Headache    History of blood transfusion 08/2017   [facility]   History of hiatal hernia    IBS (irritable bowel syndrome)    Infertility, female    Infertility, female    Lactose intolerance    Lower extremity edema    Migraines    Morbid obesity ([place])    Osteoarthritis    Hands, ankles; HLA-B27 positive   PCOS (polycystic ovarian syndrome)    Prediabetes    Seasonal allergies    With recurrent  allergic rhinitis   Sleep apnea    SOB (shortness of breath)    Uterine leiomyoma    Vitamin D deficiency     Past Surgical History:  Procedure Laterality Date   CARDIAC CT ANGIOGRAM  03/2019    Ca Score 0. Normal Coronary origin R dominant. No evidence of CAD. ? Liver nodules - poorly visualized (consider MRI w & w/o Gad contrast)   CERVICAL POLYPECTOMY  01/15/2018   Procedure: CERVICAL POLYPECTOMY;  Surgeon: Arvella Nigh, MD;  Location: [facility];  Service: Gynecology;;   COLONOSCOPY  2008   Normal   DIAGNOSTIC LAPAROSCOPY  08/1999   dermoid cyst, RSO   DILATION AND CURETTAGE OF UTERUS  05/2004   MAB   DILATION AND CURETTAGE OF UTERUS  04/2015   HYSTEROSCOPY WITH D & C  07/22/2011   Procedure: DILATATION AND CURETTAGE /HYSTEROSCOPY;  Surgeon: Cyril Mourning, MD;  Location: [facility];  Service: Gynecology;;   HYSTEROSCOPY WITH D & C N/A 01/15/2018   Procedure: DILATATION AND CURETTAGE [place] WITH MYOSURE;  Surgeon: Arvella Nigh, MD;  Location: [facility];  Service: Gynecology;  Laterality: N/A;   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  Suggestion of high cardiac output (possibly related to anemia thyrotoxicosis, Pregnancy, sepsis etc.) -- was in setting of symptomatic anemia - Hgb 5.7    There were no vitals filed for this visit.   Subjective Assessment - 07/01/22 1805     Currently in Pain? No/denies    Pain Score 0-No pain                   Group Session:  S: Better today  O: The primary objective of this group therapy session is to equip participants with practical strategies and coping mechanisms to effectively manage accumulated tasks that seem insurmountable due to mental health challenges such as depression and anxiety. The group aims to build a supportive environment wherein individuals can openly discuss their struggles, share experiences, and learn from one another. The  session's strategies will encompass goal setting, time management, task breakdown, creating conducive environments, and mindfulness practices. We aim to help participants perceive tasks as manageable units rather than overwhelming piles and encourage an approach of progress over perfection.   A: Patient demonstrated active engagement throughout the session. They contributed valuable insights during discussions, asked relevant questions, and showed enthusiasm towards learning new strategies. Their interaction with other group members was respectful and empathetic, contributing to a supportive group dynamic. Patient appeared to resonate with the strategies of "breaking it down" and "creating a conducive environment," and they shared plans to incorporate these strategies into their daily routine. Their active participation, willingness to share personal experiences, and acceptance of new strategies demonstrate a strong benefit from this therapy session.   P: Continue to attend PHP OT group sessions 5x week for 4 weeks to promote daily structure, social engagement, and opportunities to develop and utilize adaptive strategies to maximize functional performance in preparation for safe transition and integration back into school, work, and the community. Plan to address topic of sleep in next OT group session.                OT Education - 07/01/22 1805     Education Details Task Mgmt              OT Short Term Goals - 06/25/22 1025       OT SHORT TERM GOAL #1   Title Patient will be educated on strategies to improve psychosocial skills needed to participate fully in all daily, work, and leisure activities.    Time 4    Period Weeks    Status On-going    Target Date 07/25/22      OT SHORT TERM GOAL #2   Title Pt will apply psychosocial skills and coping mechanisms to daily activities in order to function independently and reintegrate into community dwelling      OT [place] #3   Title Pt will recall and/or apply 1-3 sleep hygiene strategies to improve BADL routine upon reintegrating into community      OT [place] #4   Title Pt will choose and/or engage in 1-3 socially engaging leisure activities to improve social participation skills upon reintegrating into community      OT [place] #5   Title Pt will engage in goal setting to improve functional BADL/IADL routine upon reintegrating into community                      Plan - 07/01/22 1805     Psychosocial Skills Coping Strategies;Habits;Interpersonal Interaction;Routines and Behaviors  Patient will benefit from skilled therapeutic intervention in order to improve the following deficits and impairments:       Psychosocial Skills: Coping Strategies, Habits, Interpersonal Interaction, Routines and Behaviors   Visit Diagnosis: Difficulty coping    Problem List Patient Active Problem List   Diagnosis Date Noted   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   Acute on chronic diastolic congestive heart failure ([place]) 35/45/6256   Acute diastolic heart failure ([place])    Pulmonary edema 08/07/2019   Sinus tachycardia 05/17/2019   Atypical chest pain 02/11/2019   Pulmonary hypertension, unspecified ([place]) 02/11/2019   Functional systolic murmur 38/93/7342   Inadequate sleep hygiene 12/08/2018   Psychophysiological insomnia 12/08/2018   Obesity hypoventilation syndrome ([place]) 12/08/2018   OSA (obstructive sleep apnea) 12/08/2018   Chronic migraine without aura without status migrainosus, not intractable 09/21/2018   Bipolar 1 disorder, depressed, severe ([place]) 09/08/2018   Endometrium, polyp 01/15/2018    Class: Present on Admission   Fibroids, submucosal 01/15/2018    Class: Present on Admission   Iron deficiency anemia due to chronic blood loss 09/16/2017   Menorrhagia with regular cycle    Symptomatic anemia 08/28/2017   Acute  blood loss anemia 08/28/2017   Bipolar disorder ([place]) 08/28/2017   Anxiety 08/28/2017   Depression with anxiety 08/28/2017   Fibroids 08/28/2017   PCOS (polycystic ovarian syndrome) 08/28/2017   Fever 08/28/2017   HTN (hypertension) 08/28/2017   Morbid obesity ([place]) 12/04/2015   Pregnancy with history of uterine myomectomy 04/07/2015   History of right oophorectomy 02/21/2015   History of recurrent miscarriages 02/01/2015   Vitamin D deficiency 02/01/2015    Brantley Stage, OT 07/01/2022, 6:05 PM  [facility], [address] [place], [place], [address] Phone: [phone]   Fax:  [phone]  Name: Christine [name] MRN: [id number] Date of Birth: 29-Nov-1975

## 2022-07-01 NOTE — Progress Notes (Signed)
BH MD/PA/NP PHP Progress Note   Virtual Visit via Video Note  I connected with Christine [name] on 07/01/22 at  9:00 AM EST by a video enabled telemedicine application and verified that I am speaking with the correct person using two identifiers.  Location: Patient: Home Provider: [facility]   I discussed the limitations of evaluation and management by telemedicine and the availability of in person appointments. The patient expressed understanding and agreed to proceed.   07/01/2022 12:23 PM Christine [name]  MRN:  [id number]  Chief Complaint:  Chief Complaint  Patient presents with   Follow-up   Manic Behavior   HPI:  Christine [name] is a 46 yr old female who presents via Virtual Video to establish care and for medication management, she enrolled in the [facility] Program 06/24/2022.  PPHx is significant for Bipolar Disorder and Anxiety, a history of Self Injurious Behavior (cutting- last 2020), and 4 Psychiatric Hospitalizations (last [facility] 09/2018), and no history of Suicide Attempts.   She reports that things have been up and down the last week.  She reports Wednesday she got upset with her husband and kids and stayed up all night.  She reports that she did not take her medications at night because they "make her depressed even though they do make her sleep.  She reports that Thursday was okay but then Friday she once again stayed up all night.  She reports that she has been more irritable and talking faster the last few days.  Discussed with her that it does sound like she is currently in a mixed episode.  At this point she became tearful and said she does not want to go to the hospital.  Asked her if she had gotten the results of her lithium level and she reports that she did and it was 0.3.  Discussed that with this we could further increase her lithium.  Discussed that we would increase her morning dose to 600 mg at this time and she was agreeable to this.  She reports that she has had some  thoughts of self-harm but she quickly dismisses this and does not want to act on them.  She reports that she has had some SI but has no plan/intent and she does contract for safety.  She reports no HI or AVH.  She reports her sleep is okay.  She reports her appetite is fair.  She reports no other concerns at present.   Visit Diagnosis:    ICD-10-CM   1. Bipolar 1 disorder, mixed, moderate (HCC)  F31.62 lithium carbonate (LITHOBID) 300 MG CR tablet    2. Generalized anxiety disorder  F41.1       Past Psychiatric History: Bipolar Disorder and Anxiety, a history of Self Injurious Behavior (cutting- last 2020), and 4 Psychiatric Hospitalizations (last [facility] 09/2018), and no history of Suicide Attempts.   Past Medical History:  Past Medical History:  Diagnosis Date   Anemia    Presumably from menorrhagia/menomenorrhagia; has had both blood and iron transfusions   Anxiety    Back pain    Bipolar 1 disorder ([place])    With depression   Chest pain    Depression    Diastolic heart failure ([place])    Essential hypertension    Fibromyalgia    Headache    History of blood transfusion 08/2017   [facility]   History of hiatal hernia    IBS (irritable bowel syndrome)    Infertility, female    Infertility, female  Lactose intolerance    Lower extremity edema    Migraines    Morbid obesity (HCC)    Osteoarthritis    Hands, ankles; HLA-B27 positive   PCOS (polycystic ovarian syndrome)    Prediabetes    Seasonal allergies    With recurrent allergic rhinitis   Sleep apnea    SOB (shortness of breath)    Uterine leiomyoma    Vitamin D deficiency     Past Surgical History:  Procedure Laterality Date   CARDIAC CT ANGIOGRAM  03/2019    Ca Score 0. Normal Coronary origin R dominant. No evidence of CAD. ? Liver nodules - poorly visualized (consider MRI w & w/o Gad contrast)   CERVICAL POLYPECTOMY  01/15/2018   Procedure: CERVICAL POLYPECTOMY;  Surgeon: Arvella Nigh, MD;  Location: [facility];  Service:  Gynecology;;   COLONOSCOPY  2008   Normal   DIAGNOSTIC LAPAROSCOPY  08/1999   dermoid cyst, RSO   DILATION AND CURETTAGE OF UTERUS  05/2004   MAB   DILATION AND CURETTAGE OF UTERUS  04/2015   HYSTEROSCOPY WITH D & C  07/22/2011   Procedure: DILATATION AND CURETTAGE /HYSTEROSCOPY;  Surgeon: Cyril Mourning, MD;  Location: [facility];  Service: Gynecology;;   HYSTEROSCOPY WITH D & C N/A 01/15/2018   Procedure: DILATATION AND CURETTAGE [place] WITH MYOSURE;  Surgeon: Arvella Nigh, MD;  Location: [facility];  Service: Gynecology;  Laterality: N/A;   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  Suggestion of high cardiac output (possibly related to anemia thyrotoxicosis, Pregnancy, sepsis etc.) -- was in setting of symptomatic anemia - Hgb 5.7    Family Psychiatric History: Mother- Depression, Anxiety No Known history of Substance Abuse or Suicides.  Family History:  Family History  Problem Relation Age of Onset   Anxiety disorder Mother    Depression Mother    Cancer Mother        pancreatic    High blood pressure Mother    Hypertension Mother    Hyperlipidemia Mother    Cancer Father        colon   High blood pressure Father    Diabetes Father    Hypertension Father    Hyperlipidemia Father    Heart disease Father    Stroke Father    Kidney disease Father    Sleep apnea Father    Obesity Father    Cancer Brother 6       leukemia, childhood   High blood pressure Brother    Migraines Neg Hx     Social History:  Social History   Socioeconomic History   Marital status: Married    Spouse name: Vaness Jelinski   Number of children: 2   Years of education: 2 years of grad school + bach & HS   Highest education level: Bachelor's degree (e.g., BA, AB, BS)  Occupational History   Occupation: disabled   Occupation: Part time Naval architect  Tobacco Use   Smoking status: Never   Smokeless  tobacco: Never  Vaping Use   Vaping Use: Never used  Substance and Sexual Activity   Alcohol use: Yes    Comment: occasional, maybe once a month or so   Drug use: No   Sexual activity: Yes    Partners: Male    Birth control/protection: None  Other Topics Concern   Not on file  Social History  Narrative   ** Merged History Encounter **       Lives at home with her husband and 2 adopted daughters. Right handed Caffeine: decreased intake, drinks rarely  Is on long-term disability.   Social Determinants of Health   Financial Resource Strain: Medium Risk (09/10/2017)   Overall Financial Resource Strain (CARDIA)    Difficulty of Paying Living Expenses: Somewhat hard  Food Insecurity: Food Insecurity Present (09/10/2017)   Hunger Vital Sign    Worried About Running Out of Food in the Last Year: Sometimes true    Ran Out of Food in the Last Year: Sometimes true  Transportation Needs: No Transportation Needs (09/10/2017)   PRAPARE - Hydrologist (Medical): No    Lack of Transportation (Non-Medical): No  Physical Activity: Unknown (09/10/2017)   Exercise Vital Sign    Days of Exercise per Week: 0 days    Minutes of Exercise per Session: Not on file  Stress: Stress Concern Present (09/10/2017)   [place]    Feeling of Stress : Rather much  Social Connections: Unknown (09/10/2017)   Social Connection and Isolation Panel [NHANES]    Frequency of Communication with Friends and Family: Never    Frequency of Social Gatherings with Friends and Family: Never    Attends Religious Services: Never    Marine scientist or Organizations: No    Attends Archivist Meetings: Never    Marital Status: Not on file    Allergies:  Allergies  Allergen Reactions   Penicillins Rash and Other (See Comments)    Has patient had a PCN reaction causing immediate rash, facial/tongue/throat swelling, SOB or  lightheadedness with hypotension: Yes Has patient had a PCN reaction causing severe rash involving mucus membranes or skin necrosis: Unknown Has patient had a PCN reaction that required hospitalization: No Has patient had a PCN reaction occurring within the last 10 years: No If all of the above answers are "NO", then may proceed with Cephalosporin use.     Metabolic Disorder Labs: Lab Results  Component Value Date   HGBA1C 5.7 05/02/2022   No results found for: "PROLACTIN" Lab Results  Component Value Date   CHOL 178 05/09/2022   TRIG 81 05/09/2022   HDL 38 (L) 05/09/2022   LDLCALC 125 (H) 05/09/2022   LDLCALC 131 (H) 10/07/2018   Lab Results  Component Value Date   TSH 1.55 05/02/2022   TSH 1.215 08/08/2019    Therapeutic Level Labs: Lab Results  Component Value Date   LITHIUM 0.12 (L) 08/07/2019   LITHIUM 0.4 (L) 10/02/2018   No results found for: "VALPROATE" No results found for: "CBMZ"  Current Medications: Current Outpatient Medications  Medication Sig Dispense Refill   acetaminophen (TYLENOL) 325 MG tablet Take 650 mg by mouth every 6 (six) hours as needed for mild pain or headache.     CAPLYTA 42 MG capsule Take 42 mg by mouth daily.     chlorthalidone (HYGROTON) 25 MG tablet TAKE 1/2 TABLET(12.5 MG) BY MOUTH DAILY 45 tablet 0   clonazePAM (KLONOPIN) 0.5 MG tablet Take 1 tablet (0.5 mg total) by mouth daily as needed for anxiety. 30 tablet 0   fluticasone (FLONASE) 50 MCG/ACT nasal spray 1 spray in each nostril     lamoTRIgine (LAMICTAL) 150 MG tablet Take 1 tablet (150 mg total) by mouth 2 (two) times daily. For mood 60 tablet 2   lithium carbonate (LITHOBID)  300 MG CR tablet Take 1-2 tablets (300-600 mg total) by mouth 2 (two) times daily. Take 2 tablets (600 mg) every morning and 1 tablet (300 mg) at night 90 tablet 0   metFORMIN (GLUCOPHAGE) 500 MG tablet Take 1 tablet (500 mg total) by mouth 2 (two) times daily with a meal. 60 tablet 0   metoprolol  succinate (TOPROL-XL) 100 MG 24 hr tablet TAKE 1 TABLET BY MOUTH DAILY WITH OR IMMEDIATELY FOLLOWING A MEAL 90 tablet 1   Vitamin D, Ergocalciferol, (DRISDOL) 1.25 MG (50000 UNIT) CAPS capsule Take 1 capsule (50,000 Units total) by mouth every 7 (seven) days. 5 capsule    amLODipine (NORVASC) 5 MG tablet Take 1 tablet (5 mg total) by mouth daily. 30 tablet 0   gabapentin (NEURONTIN) 100 MG capsule Take 100 mg by mouth 3 (three) times daily.     No current facility-administered medications for this visit.     Musculoskeletal: Strength & Muscle Tone: within normal limits Gait & Station:  sitting during interview Patient leans: N/A  Psychiatric Specialty Exam: Review of Systems  Respiratory:  Negative for shortness of breath.   Cardiovascular:  Negative for chest pain.  Gastrointestinal:  Negative for abdominal pain, constipation, diarrhea, nausea and vomiting.  Neurological:  Negative for dizziness, weakness and headaches.  Psychiatric/Behavioral:  Positive for dysphoric mood and suicidal ideas (No intent, Contracts for safety). Negative for hallucinations and sleep disturbance. The patient is not nervous/anxious.     There were no vitals taken for this visit.There is no height or weight on file to calculate BMI.  General Appearance: Casual and Fairly Groomed  Eye Contact:  Good  Speech:  Clear and Coherent and Normal Rate  Volume:  Normal  Mood:  Anxious and Depressed  Affect:  Congruent and Tearful  Thought Process:  Coherent and Goal Directed  Orientation:  Full (Time, Place, and Person)  Thought Content: WDL and Logical   Suicidal Thoughts:  Yes.  without intent/plan Contracts for safety  Homicidal Thoughts:  No  Memory:  Immediate;   Good Recent;   Good  Judgement:  Fair  Insight:  Fair  Psychomotor Activity:  Normal  Concentration:  Concentration: Good and Attention Span: Good  Recall:  Good  Fund of Knowledge: Good  Language: Good  Akathisia:  Negative  Handed:  Right   AIMS (if indicated): not done  Assets:  Communication Skills Desire for Improvement Financial Resources/Insurance Housing Resilience Social Support  ADL's:  Intact  Cognition: WNL  Sleep:  Fair   Screenings: [facility] Admission (Discharged) from 09/08/2018 in [facility]  AIMS Total Score 0      AUDIT    Flowsheet Row Admission (Discharged) from 09/08/2018 in [facility]  Alcohol Use Disorder Identification Test Final Score (AUDIT) 1      GAD-7    Flowsheet Row Counselor from 10/05/2018 in [facility] Counselor from 10/02/2018 in [facility] Counselor from 09/09/2017 in [facility] [facility] Counselor from 04/04/2017 in [facility] Counselor from 03/18/2017 in [facility]  Total GAD-7 Score _0 PHQ2-9    Flowsheet Row Counselor from 07/01/2022 in [facility] Counselor from 06/19/2022 in [facility] Office Visit from 05/09/2022 in [facility] Video Visit from 06/12/2021 in [facility] Counselor from 11/30/2020 in [facility]  PHQ-2 Total Score _0 PHQ-9 Total Score _1 --      Flowsheet Row Counselor from 07/01/2022 in [place] Counselor from 06/19/2022 in [facility] ED from 05/02/2022 in [facility] DEPT  C-SSRS RISK CATEGORY Error: Question 6 not populated Low Risk No Risk        Assessment and Plan:  Larena "[name]" [name] is a 46 yr old female who presents via Virtual Video to establish care and for medication  management, she enrolled in the [facility] Program 06/24/2022.  PPHx is significant for Bipolar Disorder and Anxiety, a history of Self Injurious Behavior (cutting- last 2020), and 4 Psychiatric Hospitalizations (last [facility] 09/2018), and no history of Suicide Attempts.    "Christine [name]" has had a change in her symptoms and now appears to be in a mixed state.  Given her lithium level was 0.3 we will increase her lithium at this time.  We will start by increasing her morning dose.  We will not make any other medication changes at this time.  We will continue to monitor.   Bipolar Disorder, current episode Depressed: -Continue Caplyta 42 mg daily for mood stability. No refills sent at this time. -Continue Lamictal 150 mg BID for mood stability.  No refills sent at this time. -Increase Lithium to 600 mg AM and 300 mg QHS for depression and mood stability.  90 (300 mg) tablets with 0 refills. -Continue Klonopin 0.5 mg daily PRN for severe anxiety.  No refills sent at this time. -Engage in [place] of Care: Collaboration of Care: Other PHP Program  Patient/Guardian was advised Release of Information must be obtained prior to any record release in order to collaborate their care with an outside provider. Patient/Guardian was advised if they have not already done so to contact the registration department to sign all necessary forms in order for [place] to release information regarding their care.   Consent: Patient/Guardian gives verbal consent for treatment and assignment of benefits for services provided during this visit. Patient/Guardian expressed understanding and agreed to proceed.    [name], MD 07/01/2022, 12:23 PM    Follow Up Instructions:    I discussed the assessment and treatment plan with the patient. The patient was provided an opportunity to ask questions and all were answered. The patient agreed with the plan and demonstrated an understanding of the  instructions.   The patient was advised to call back or seek an in-person evaluation if the symptoms worsen or if the condition fails to improve as anticipated.  I provided 15 minutes of non-face-to-face time during this encounter.   [name], MD

## 2022-07-01 NOTE — Progress Notes (Unsigned)
Spoke with patient via WebEx video call, used 2 identifiers to correctly identify patient. States that groups are going OK, finds them hard and draining but helpful. She referred herself after having passive SI wishing she was dead but no plan or intent. She has done PHP twice in the past with the last time being 2016-11-06. She has had a lot of loss and grief. Her mother passed away in 2017-11-06, Father passed 11-06-20, and Father in law passed 1 month ago. She works part time and hates going to work. Does not like leaving the house. She is on disability and can work part time but she didn't want to. She needed the money. She prefers to stay home and not leave. On scale 1-10 as 10 being worst she rates depression at 8 and anxiety at 8. Denies SI/HI or AV hallucinations. PHQ9=20.

## 2022-07-02 ENCOUNTER — Other Ambulatory Visit (HOSPITAL_COMMUNITY): Payer: Medicare Other | Admitting: Licensed Clinical Social Worker

## 2022-07-02 ENCOUNTER — Encounter (HOSPITAL_COMMUNITY): Payer: Self-pay

## 2022-07-02 ENCOUNTER — Other Ambulatory Visit (HOSPITAL_COMMUNITY): Payer: Medicare Other

## 2022-07-02 DIAGNOSIS — F3162 Bipolar disorder, current episode mixed, moderate: Secondary | ICD-10-CM | POA: Diagnosis not present

## 2022-07-02 DIAGNOSIS — F411 Generalized anxiety disorder: Secondary | ICD-10-CM | POA: Diagnosis not present

## 2022-07-02 DIAGNOSIS — R4589 Other symptoms and signs involving emotional state: Secondary | ICD-10-CM

## 2022-07-02 DIAGNOSIS — F314 Bipolar disorder, current episode depressed, severe, without psychotic features: Secondary | ICD-10-CM | POA: Diagnosis not present

## 2022-07-02 DIAGNOSIS — F319 Bipolar disorder, unspecified: Secondary | ICD-10-CM | POA: Diagnosis not present

## 2022-07-02 NOTE — Therapy (Signed)
[place], [place], [address] Phone: [phone]   Fax:  [phone]  Occupational Therapy Treatment Virtual Visit via Video Note  I connected with [name] on 07/02/22 at  8:00 AM EST by a video enabled telemedicine application and verified that I am speaking with the correct person using two identifiers.  Location: Patient: home Provider: office   I discussed the limitations of evaluation and management by telemedicine and the availability of in person appointments. The patient expressed understanding and agreed to proceed.    The patient was advised to call back or seek an in-person evaluation if the symptoms worsen or if the condition fails to improve as anticipated.  I provided 55 minutes of non-face-to-face time during this encounter.   Patient Details  Name: Christine [name] MRN: [id number] Date of Birth: 10/19/1975 No data recorded  Encounter Date: 07/01/2022   OT End of Session - 07/02/22 0917     Visit Number 4    Number of Visits 20    Date for OT Re-Evaluation 07/25/22    OT Start Time 1200    OT Stop Time 1255    OT Time Calculation (min) 55 min             Past Medical History:  Diagnosis Date   Anemia    Presumably from menorrhagia/menomenorrhagia; has had both blood and iron transfusions   Anxiety    Back pain    Bipolar 1 disorder ([place])    With depression   Chest pain    Depression    Diastolic heart failure ([place])    Essential hypertension    Fibromyalgia    Headache    History of blood transfusion 08/2017   WL   History of hiatal hernia    IBS (irritable bowel syndrome)    Infertility, female    Infertility, female    Lactose intolerance    Lower extremity edema    Migraines    Morbid obesity ([place])    Osteoarthritis    Hands, ankles; HLA-B27 positive   PCOS (polycystic ovarian syndrome)    Prediabetes    Seasonal allergies    With recurrent  allergic rhinitis   Sleep apnea    SOB (shortness of breath)    Uterine leiomyoma    Vitamin D deficiency     Past Surgical History:  Procedure Laterality Date   CARDIAC CT ANGIOGRAM  03/2019    Ca Score 0. Normal Coronary origin R dominant. No evidence of CAD. ? Liver nodules - poorly visualized (consider MRI w & w/o Gad contrast)   CERVICAL POLYPECTOMY  01/15/2018   Procedure: CERVICAL POLYPECTOMY;  Surgeon: Arvella Nigh, MD;  Location: [facility];  Service: Gynecology;;   COLONOSCOPY  2008   Normal   DIAGNOSTIC LAPAROSCOPY  08/1999   dermoid cyst, RSO   DILATION AND CURETTAGE OF UTERUS  05/2004   MAB   DILATION AND CURETTAGE OF UTERUS  04/2015   HYSTEROSCOPY WITH D & C  07/22/2011   Procedure: DILATATION AND CURETTAGE /HYSTEROSCOPY;  Surgeon: Cyril Mourning, MD;  Location: [facility];  Service: Gynecology;;   HYSTEROSCOPY WITH D & C N/A 01/15/2018   Procedure: DILATATION AND CURETTAGE [place] WITH MYOSURE;  Surgeon: Arvella Nigh, MD;  Location: [facility];  Service: Gynecology;  Laterality: N/A;   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  Suggestion of high cardiac output (possibly related to anemia thyrotoxicosis, Pregnancy, sepsis etc.) -- was in setting of symptomatic anemia - Hgb 5.7    There were no vitals filed for this visit.   Subjective Assessment - 07/02/22 0917     Currently in Pain? No/denies    Pain Score 0-No pain                Group Session:  S: Better today  O: During the group therapy session, the occupational therapist discussed the impact of sleep disturbances on daily activities and overall health and wellbeing.   The OT also reviewed various types of sleep disorders, including insomnia, sleep apnea, restless leg syndrome, and narcolepsy, and their associated symptoms. Strategies for managing and treating sleep disturbances were also discussed, such as establishing  a consistent sleep routine, avoiding stimulants before bedtime, and engaging in relaxation techniques.   Today's group also included information on how sleep disturbances can cause fatigue, mood changes, cognitive impairment, and physical health problems, and emphasizes the importance of seeking prompt treatment to maintain overall health and wellbeing.   A: In today's session, the patient demonstrated active engagement with the topic of The Importance of Sleep. They eagerly asked questions, contributed personal experiences, and showcased a noticeable eagerness to apply the discussed principles. Their participation indicated not only a strong understanding of the subject matter but also an intrinsic motivation to implement better sleep practices in their daily routine. Based on their proactive involvement, it is assessed that the patient greatly benefited from today's treatment and will likely make efforts to incorporate the insights gained.   P: Continue to attend PHP OT group sessions 5x week for 4 weeks to promote daily structure, social engagement, and opportunities to develop and utilize adaptive strategies to maximize functional performance in preparation for safe transition and integration back into school, work, and the community. Plan to address topic of pt 2 in next OT group session.                   OT Education - 07/02/22 0917     Education Details Sleep Mgmt              OT Short Term Goals - 06/25/22 1025       OT SHORT TERM GOAL #1   Title Patient will be educated on strategies to improve psychosocial skills needed to participate fully in all daily, work, and leisure activities.    Time 4    Period Weeks    Status On-going    Target Date 07/25/22      OT SHORT TERM GOAL #2   Title Pt will apply psychosocial skills and coping mechanisms to daily activities in order to function independently and reintegrate into community dwelling      OT [place]  #3   Title Pt will recall and/or apply 1-3 sleep hygiene strategies to improve BADL routine upon reintegrating into community      OT [place] #4   Title Pt will choose and/or engage in 1-3 socially engaging leisure activities to improve social participation skills upon reintegrating into community      OT [place] #5   Title Pt will engage in goal setting to improve functional BADL/IADL routine upon reintegrating into community                      Plan - 07/02/22 3419  Psychosocial Skills Coping Strategies;Habits;Interpersonal Interaction;Routines and Behaviors             Patient will benefit from skilled therapeutic intervention in order to improve the following deficits and impairments:       Psychosocial Skills: Coping Strategies, Habits, Interpersonal Interaction, Routines and Behaviors   Visit Diagnosis: Difficulty coping    Problem List Patient Active Problem List   Diagnosis Date Noted   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   Acute on chronic diastolic congestive heart failure ([place]) 78/67/6720   Acute diastolic heart failure ([place])    Pulmonary edema 08/07/2019   Sinus tachycardia 05/17/2019   Atypical chest pain 02/11/2019   Pulmonary hypertension, unspecified ([place]) 02/11/2019   Functional systolic murmur 94/70/9628   Inadequate sleep hygiene 12/08/2018   Psychophysiological insomnia 12/08/2018   Obesity hypoventilation syndrome ([place]) 12/08/2018   OSA (obstructive sleep apnea) 12/08/2018   Chronic migraine without aura without status migrainosus, not intractable 09/21/2018   Bipolar 1 disorder, depressed, severe ([place]) 09/08/2018   Endometrium, polyp 01/15/2018    Class: Present on Admission   Fibroids, submucosal 01/15/2018    Class: Present on Admission   Iron deficiency anemia due to chronic blood loss 09/16/2017   Menorrhagia with regular cycle    Symptomatic anemia 08/28/2017   Acute  blood loss anemia 08/28/2017   Bipolar disorder ([place]) 08/28/2017   Anxiety 08/28/2017   Depression with anxiety 08/28/2017   Fibroids 08/28/2017   PCOS (polycystic ovarian syndrome) 08/28/2017   Fever 08/28/2017   HTN (hypertension) 08/28/2017   Morbid obesity ([place]) 12/04/2015   Pregnancy with history of uterine myomectomy 04/07/2015   History of right oophorectomy 02/21/2015   History of recurrent miscarriages 02/01/2015   Vitamin D deficiency 02/01/2015    Brantley Stage, OT 07/02/2022, 9:18 AM [facility], [place], [place], [address] Phone: [phone]   Fax:  [phone]  Name: Dorlene Footman MRN: [id number] Date of Birth: 01-Jun-1976

## 2022-07-03 ENCOUNTER — Other Ambulatory Visit (HOSPITAL_COMMUNITY): Payer: Medicare Other | Attending: Psychiatry | Admitting: Licensed Clinical Social Worker

## 2022-07-03 ENCOUNTER — Ambulatory Visit (INDEPENDENT_AMBULATORY_CARE_PROVIDER_SITE_OTHER): Payer: Medicare Other | Admitting: Family Medicine

## 2022-07-03 ENCOUNTER — Encounter (INDEPENDENT_AMBULATORY_CARE_PROVIDER_SITE_OTHER): Payer: Self-pay | Admitting: Family Medicine

## 2022-07-03 ENCOUNTER — Other Ambulatory Visit (HOSPITAL_COMMUNITY): Payer: Medicare Other

## 2022-07-03 ENCOUNTER — Encounter (HOSPITAL_COMMUNITY): Payer: Self-pay

## 2022-07-03 VITALS — BP 111/75 | HR 88 | Temp 98.5°F | Ht 64.0 in | Wt 327.0 lb

## 2022-07-03 DIAGNOSIS — Z6841 Body Mass Index (BMI) 40.0 and over, adult: Secondary | ICD-10-CM

## 2022-07-03 DIAGNOSIS — F319 Bipolar disorder, unspecified: Secondary | ICD-10-CM | POA: Diagnosis not present

## 2022-07-03 DIAGNOSIS — F314 Bipolar disorder, current episode depressed, severe, without psychotic features: Secondary | ICD-10-CM | POA: Insufficient documentation

## 2022-07-03 DIAGNOSIS — E669 Obesity, unspecified: Secondary | ICD-10-CM | POA: Diagnosis not present

## 2022-07-03 DIAGNOSIS — F411 Generalized anxiety disorder: Secondary | ICD-10-CM | POA: Diagnosis not present

## 2022-07-03 DIAGNOSIS — R4589 Other symptoms and signs involving emotional state: Secondary | ICD-10-CM

## 2022-07-03 DIAGNOSIS — E7849 Other hyperlipidemia: Secondary | ICD-10-CM | POA: Diagnosis not present

## 2022-07-03 NOTE — Therapy (Signed)
[place], [place], [address] Phone: [phone]   Fax:  [phone]  Occupational Therapy Treatment Virtual Visit via Video Note  I connected with [name] on 07/03/22 at  8:00 AM EST by a video enabled telemedicine application and verified that I am speaking with the correct person using two identifiers.  Location: Patient: home Provider: office   I discussed the limitations of evaluation and management by telemedicine and the availability of in person appointments. The patient expressed understanding and agreed to proceed.    The patient was advised to call back or seek an in-person evaluation if the symptoms worsen or if the condition fails to improve as anticipated.  I provided 55 minutes of non-face-to-face time during this encounter.   Patient Details  Name: Christine [name] MRN: [id number] Date of Birth: 14-Feb-1976 No data recorded  Encounter Date: 07/02/2022   OT End of Session - 07/03/22 2046     Visit Number 5    Number of Visits 20    Date for OT Re-Evaluation 07/25/22    OT Start Time 1200    OT Stop Time 1255    OT Time Calculation (min) 55 min             Past Medical History:  Diagnosis Date   Anemia    Presumably from menorrhagia/menomenorrhagia; has had both blood and iron transfusions   Anxiety    Back pain    Bipolar 1 disorder ([place])    With depression   Chest pain    Depression    Diastolic heart failure ([place])    Essential hypertension    Fibromyalgia    Headache    History of blood transfusion 08/2017   WL   History of hiatal hernia    IBS (irritable bowel syndrome)    Infertility, female    Infertility, female    Lactose intolerance    Lower extremity edema    Migraines    Morbid obesity ([facility])    Osteoarthritis    Hands, ankles; HLA-B27 positive   PCOS (polycystic ovarian syndrome)    Prediabetes    Seasonal allergies    With recurrent  allergic rhinitis   Sleep apnea    SOB (shortness of breath)    Uterine leiomyoma    Vitamin D deficiency     Past Surgical History:  Procedure Laterality Date   CARDIAC CT ANGIOGRAM  03/2019    Ca Score 0. Normal Coronary origin R dominant. No evidence of CAD. ? Liver nodules - poorly visualized (consider MRI w & w/o Gad contrast)   CERVICAL POLYPECTOMY  01/15/2018   Procedure: CERVICAL POLYPECTOMY;  Surgeon: Arvella Nigh, MD;  Location: [facility];  Service: Gynecology;;   COLONOSCOPY  2008   Normal   DIAGNOSTIC LAPAROSCOPY  08/1999   dermoid cyst, RSO   DILATION AND CURETTAGE OF UTERUS  05/2004   MAB   DILATION AND CURETTAGE OF UTERUS  04/2015   HYSTEROSCOPY WITH D & C  07/22/2011   Procedure: DILATATION AND CURETTAGE /HYSTEROSCOPY;  Surgeon: Cyril Mourning, MD;  Location: [facility];  Service: Gynecology;;   HYSTEROSCOPY WITH D & C N/A 01/15/2018   Procedure: DILATATION AND CURETTAGE [place] WITH MYOSURE;  Surgeon: Arvella Nigh, MD;  Location: [facility];  Service: Gynecology;  Laterality: N/A;   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  Suggestion of high cardiac output (possibly related to anemia thyrotoxicosis, Pregnancy, sepsis etc.) -- was in setting of symptomatic anemia - Hgb 5.7    There were no vitals filed for this visit.   Subjective Assessment - 07/03/22 2045     Currently in Pain? No/denies    Pain Score 0-No pain                 Group Session:  S: I think today is a little better. Still working on the small changes.   O: During the group therapy session, the occupational therapist discussed the impact of sleep disturbances on daily activities and overall health and wellbeing.   The OT also reviewed various types of sleep disorders, including insomnia, sleep apnea, restless leg syndrome, and narcolepsy, and their associated symptoms. Strategies for managing and treating  sleep disturbances were also discussed, such as establishing a consistent sleep routine, avoiding stimulants before bedtime, and engaging in relaxation techniques.   Today's group also included information on how sleep disturbances can cause fatigue, mood changes, cognitive impairment, and physical health problems, and emphasizes the importance of seeking prompt treatment to maintain overall health and wellbeing.   A: In today's session, the patient demonstrated active engagement with the topic of The Importance of Sleep. They eagerly asked questions, contributed personal experiences, and showcased a noticeable eagerness to apply the discussed principles. Their participation indicated not only a strong understanding of the subject matter but also an intrinsic motivation to implement better sleep practices in their daily routine. Based on their proactive involvement, it is assessed that the patient greatly benefited from today's treatment and will likely make efforts to incorporate the insights gained.   P: Continue to attend PHP OT group sessions 5x week for 4 weeks to promote daily structure, social engagement, and opportunities to develop and utilize adaptive strategies to maximize functional performance in preparation for safe transition and integration back into school, work, and the community. Plan to address topic of pt 2 in next OT group session.                  OT Education - 07/03/22 2045     Education Details Sleep Mgmt 2              OT Short Term Goals - 06/25/22 1025       OT SHORT TERM GOAL #1   Title Patient will be educated on strategies to improve psychosocial skills needed to participate fully in all daily, work, and leisure activities.    Time 4    Period Weeks    Status On-going    Target Date 07/25/22      OT SHORT TERM GOAL #2   Title Pt will apply psychosocial skills and coping mechanisms to daily activities in order to function independently and  reintegrate into community dwelling      OT [place] #3   Title Pt will recall and/or apply 1-3 sleep hygiene strategies to improve BADL routine upon reintegrating into community      OT [place] #4   Title Pt will choose and/or engage in 1-3 socially engaging leisure activities to improve social participation skills upon reintegrating into community      OT [place] #5   Title Pt will engage in goal setting to improve functional BADL/IADL routine upon reintegrating into community  Plan - 07/03/22 2046     Psychosocial Skills Coping Strategies;Habits;Interpersonal Interaction;Routines and Behaviors             Patient will benefit from skilled therapeutic intervention in order to improve the following deficits and impairments:       Psychosocial Skills: Coping Strategies, Habits, Interpersonal Interaction, Routines and Behaviors   Visit Diagnosis: Difficulty coping    Problem List Patient Active Problem List   Diagnosis Date Noted   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   Acute on chronic diastolic congestive heart failure ([place]) 46/80/3212   Acute diastolic heart failure ([place])    Pulmonary edema 08/07/2019   Sinus tachycardia 05/17/2019   Atypical chest pain 02/11/2019   Pulmonary hypertension, unspecified ([place]) 02/11/2019   Functional systolic murmur 24/82/5003   Inadequate sleep hygiene 12/08/2018   Psychophysiological insomnia 12/08/2018   Obesity hypoventilation syndrome ([place]) 12/08/2018   OSA (obstructive sleep apnea) 12/08/2018   Chronic migraine without aura without status migrainosus, not intractable 09/21/2018   Bipolar 1 disorder, depressed, severe ([place]) 09/08/2018   Endometrium, polyp 01/15/2018    Class: Present on Admission   Fibroids, submucosal 01/15/2018    Class: Present on Admission   Iron deficiency anemia due to chronic blood loss 09/16/2017   Menorrhagia with  regular cycle    Symptomatic anemia 08/28/2017   Acute blood loss anemia 08/28/2017   Bipolar disorder ([place]) 08/28/2017   Anxiety 08/28/2017   Depression with anxiety 08/28/2017   Fibroids 08/28/2017   PCOS (polycystic ovarian syndrome) 08/28/2017   Fever 08/28/2017   HTN (hypertension) 08/28/2017   Morbid obesity ([place]) 12/04/2015   Pregnancy with history of uterine myomectomy 04/07/2015   History of right oophorectomy 02/21/2015   History of recurrent miscarriages 02/01/2015   Vitamin D deficiency 02/01/2015    Brantley Stage, OT 07/03/2022, 8:46 PM  [facility], [place], [place], [address] Phone: [phone]   Fax:  [phone]  Name: Kariana Wiles MRN: [id number] Date of Birth: 1975-09-01

## 2022-07-04 ENCOUNTER — Other Ambulatory Visit (HOSPITAL_COMMUNITY): Payer: Medicare Other

## 2022-07-04 ENCOUNTER — Other Ambulatory Visit (HOSPITAL_COMMUNITY): Payer: Medicare Other | Admitting: Licensed Clinical Social Worker

## 2022-07-04 ENCOUNTER — Encounter (HOSPITAL_COMMUNITY): Payer: Self-pay

## 2022-07-04 DIAGNOSIS — F411 Generalized anxiety disorder: Secondary | ICD-10-CM | POA: Diagnosis not present

## 2022-07-04 DIAGNOSIS — F3162 Bipolar disorder, current episode mixed, moderate: Secondary | ICD-10-CM

## 2022-07-04 DIAGNOSIS — R4589 Other symptoms and signs involving emotional state: Secondary | ICD-10-CM

## 2022-07-04 DIAGNOSIS — F314 Bipolar disorder, current episode depressed, severe, without psychotic features: Secondary | ICD-10-CM | POA: Diagnosis not present

## 2022-07-04 DIAGNOSIS — F319 Bipolar disorder, unspecified: Secondary | ICD-10-CM | POA: Diagnosis not present

## 2022-07-04 MED ORDER — LITHIUM CARBONATE ER 300 MG PO TBCR: 600.0000 mg | EXTENDED_RELEASE_TABLET | Freq: Two times a day (BID) | ORAL | 0 refills | Status: DC

## 2022-07-04 NOTE — Progress Notes (Signed)
BH MD/PA/NP OP Progress Note   Virtual Visit via Video Note  I connected with Christine [name] on 07/04/22 at  9:00 AM EST by a video enabled telemedicine application and verified that I am speaking with the correct person using two identifiers.  Location: Patient: Home Provider: [facility]   I discussed the limitations of evaluation and management by telemedicine and the availability of in person appointments. The patient expressed understanding and agreed to proceed.   07/04/2022 10:29 AM Christine [name]  MRN:  [id number]  Chief Complaint:  Chief Complaint  Patient presents with   Follow-up   Manic Behavior   Depression   Anxiety   HPI:  Christine [name] is a 46 yr old female who presents via Virtual Video for follow up and for medication management, she enrolled in the [facility] 06/24/2022.  PPHx is significant for Bipolar Disorder and Anxiety, a history of Self Injurious Behavior (cutting- last 2020), and 4 Psychiatric Hospitalizations (last [facility] 09/2018), and no history of Suicide Attempts.    She reports that she has been doing better since the increase in her lithium on Monday.  She reports that the racing thoughts have had some improvement.  She reports her depression has also had some improvement.  She reports no side effects from the increase in lithium which she has had in the past.  She reports that she started gabapentin yesterday and so does have a little bit of tiredness and dizziness today.  Discussed further increasing her lithium today.  Discussed increasing her evening dose to 600 mg to match her morning dose and she was agreeable to this.  She reports she has not had any SI since yesterday for the first time in a long time.  She reports no HI or AVH.  She reports no other concerns at present.  Visit Diagnosis:    ICD-10-CM   1. Bipolar 1 disorder, mixed, moderate (HCC)  F31.62 lithium carbonate (LITHOBID) 300 MG CR tablet    2. Generalized anxiety disorder  F41.1        Past Psychiatric History: Bipolar Disorder and Anxiety, a history of Self Injurious Behavior (cutting- last 2020), and 4 Psychiatric Hospitalizations (last [facility] 09/2018), and no history of Suicide Attempts.   Past Medical History:  Past Medical History:  Diagnosis Date   Anemia    Presumably from menorrhagia/menomenorrhagia; has had both blood and iron transfusions   Anxiety    Back pain    Bipolar 1 disorder (HCC)    With depression   Chest pain    Depression    Diastolic heart failure ([place])    Essential hypertension    Fibromyalgia    Headache    History of blood transfusion 08/2017   [facility]   History of hiatal hernia    IBS (irritable bowel syndrome)    Infertility, female    Infertility, female    Lactose intolerance    Lower extremity edema    Migraines    Morbid obesity (HCC)    Osteoarthritis    Hands, ankles; HLA-B27 positive   PCOS (polycystic ovarian syndrome)    Prediabetes    Seasonal allergies    With recurrent allergic rhinitis   Sleep apnea    SOB (shortness of breath)    Uterine leiomyoma    Vitamin D deficiency     Past Surgical History:  Procedure Laterality Date   CARDIAC CT ANGIOGRAM  03/2019    Ca Score 0. Normal Coronary origin R dominant. No evidence  of CAD. ? Liver nodules - poorly visualized (consider MRI w & w/o Gad contrast)   CERVICAL POLYPECTOMY  01/15/2018   Procedure: CERVICAL POLYPECTOMY;  Surgeon: Arvella Nigh, MD;  Location: [facility];  Service: Gynecology;;   COLONOSCOPY  2008   Normal   DIAGNOSTIC LAPAROSCOPY  08/1999   dermoid cyst, RSO   DILATION AND CURETTAGE OF UTERUS  05/2004   MAB   DILATION AND CURETTAGE OF UTERUS  04/2015   HYSTEROSCOPY WITH D & C  07/22/2011   Procedure: DILATATION AND CURETTAGE /HYSTEROSCOPY;  Surgeon: Cyril Mourning, MD;  Location: [facility];  Service: Gynecology;;   HYSTEROSCOPY WITH D & C N/A 01/15/2018   Procedure: DILATATION AND CURETTAGE [place] WITH MYOSURE;  Surgeon: Arvella Nigh, MD;   Location: [facility];  Service: Gynecology;  Laterality: N/A;   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  Suggestion of high cardiac output (possibly related to anemia thyrotoxicosis, Pregnancy, sepsis etc.) -- was in setting of symptomatic anemia - Hgb 5.7    Family Psychiatric History: Mother- Depression, Anxiety No Known history of Substance Abuse or Suicides.  Family History:  Family History  Problem Relation Age of Onset   Anxiety disorder Mother    Depression Mother    Cancer Mother        pancreatic    High blood pressure Mother    Hypertension Mother    Hyperlipidemia Mother    Cancer Father        colon   High blood pressure Father    Diabetes Father    Hypertension Father    Hyperlipidemia Father    Heart disease Father    Stroke Father    Kidney disease Father    Sleep apnea Father    Obesity Father    Cancer Brother 6       leukemia, childhood   High blood pressure Brother    Migraines Neg Hx     Social History:  Social History   Socioeconomic History   Marital status: Married    Spouse name: Christine [name]   Number of children: 2   Years of education: 2 years of grad school + bach & HS   Highest education level: Bachelor's degree (e.g., BA, AB, BS)  Occupational History   Occupation: disabled   Occupation: Part time Naval architect  Tobacco Use   Smoking status: Never   Smokeless tobacco: Never  Vaping Use   Vaping Use: Never used  Substance and Sexual Activity   Alcohol use: Yes    Comment: occasional, maybe once a month or so   Drug use: No   Sexual activity: Yes    Partners: Male    Birth control/protection: None  Other Topics Concern   Not on file  Social History Narrative   ** Merged History Encounter **       Lives at home with her husband and 2 adopted daughters. Right handed Caffeine: decreased intake, drinks rarely  Is on long-term disability.    Social Determinants of Health   Financial Resource Strain: Medium Risk (09/10/2017)   Overall Financial Resource Strain (CARDIA)    Difficulty of Paying Living Expenses: Somewhat hard  Food Insecurity: Food Insecurity Present (09/10/2017)   Hunger Vital Sign    Worried About Running Out of Food in the Last Year: Sometimes true    Ran Out of Food in the Last  Year: Sometimes true  Transportation Needs: No Transportation Needs (09/10/2017)   PRAPARE - Hydrologist (Medical): No    Lack of Transportation (Non-Medical): No  Physical Activity: Unknown (09/10/2017)   Exercise Vital Sign    Days of Exercise per Week: 0 days    Minutes of Exercise per Session: Not on file  Stress: Stress Concern Present (09/10/2017)   [place]    Feeling of Stress : Rather much  Social Connections: Unknown (09/10/2017)   Social Connection and Isolation Panel [NHANES]    Frequency of Communication with Friends and Family: Never    Frequency of Social Gatherings with Friends and Family: Never    Attends Religious Services: Never    Marine scientist or Organizations: No    Attends Archivist Meetings: Never    Marital Status: Not on file    Allergies:  Allergies  Allergen Reactions   Penicillins Rash and Other (See Comments)    Has patient had a PCN reaction causing immediate rash, facial/tongue/throat swelling, SOB or lightheadedness with hypotension: Yes Has patient had a PCN reaction causing severe rash involving mucus membranes or skin necrosis: Unknown Has patient had a PCN reaction that required hospitalization: No Has patient had a PCN reaction occurring within the last 10 years: No If all of the above answers are "NO", then may proceed with Cephalosporin use.     Metabolic Disorder Labs: Lab Results  Component Value Date   HGBA1C 5.7 05/02/2022   No results found for: "PROLACTIN" Lab  Results  Component Value Date   CHOL 178 05/09/2022   TRIG 81 05/09/2022   HDL 38 (L) 05/09/2022   LDLCALC 125 (H) 05/09/2022   LDLCALC 131 (H) 10/07/2018   Lab Results  Component Value Date   TSH 1.55 05/02/2022   TSH 1.215 08/08/2019    Therapeutic Level Labs: Lab Results  Component Value Date   LITHIUM 0.12 (L) 08/07/2019   LITHIUM 0.4 (L) 10/02/2018   No results found for: "VALPROATE" No results found for: "CBMZ"  Current Medications: Current Outpatient Medications  Medication Sig Dispense Refill   acetaminophen (TYLENOL) 325 MG tablet Take 650 mg by mouth every 6 (six) hours as needed for mild pain or headache.     amLODipine (NORVASC) 5 MG tablet Take 1 tablet (5 mg total) by mouth daily. 30 tablet 0   CAPLYTA 42 MG capsule Take 42 mg by mouth daily.     chlorthalidone (HYGROTON) 25 MG tablet TAKE 1/2 TABLET(12.5 MG) BY MOUTH DAILY 45 tablet 0   clonazePAM (KLONOPIN) 0.5 MG tablet Take 1 tablet (0.5 mg total) by mouth daily as needed for anxiety. 30 tablet 0   fluticasone (FLONASE) 50 MCG/ACT nasal spray 1 spray in each nostril     gabapentin (NEURONTIN) 100 MG capsule Take 100 mg by mouth 3 (three) times daily. (Patient not taking: Reported on 07/03/2022)     lamoTRIgine (LAMICTAL) 150 MG tablet Take 1 tablet (150 mg total) by mouth 2 (two) times daily. For mood 60 tablet 2   lithium carbonate (LITHOBID) 300 MG CR tablet Take 2 tablets (600 mg total) by mouth 2 (two) times daily. Take 2 tablets (600 mg) every morning and 1 tablet (300 mg) at night 90 tablet 0   metFORMIN (GLUCOPHAGE) 500 MG tablet Take 1 tablet (500 mg total) by mouth 2 (two) times daily with a meal. 60 tablet 0   metoprolol  succinate (TOPROL-XL) 100 MG 24 hr tablet TAKE 1 TABLET BY MOUTH DAILY WITH OR IMMEDIATELY FOLLOWING A MEAL 90 tablet 1   Vitamin D, Ergocalciferol, (DRISDOL) 1.25 MG (50000 UNIT) CAPS capsule Take 1 capsule (50,000 Units total) by mouth every 7 (seven) days. 5 capsule    No current  facility-administered medications for this visit.     Musculoskeletal: Strength & Muscle Tone: within normal limits Gait & Station:  sitting during interview Patient leans: N/A  Psychiatric Specialty Exam: Review of Systems  Respiratory:  Negative for shortness of breath.   Cardiovascular:  Negative for chest pain.  Gastrointestinal:  Negative for abdominal pain, constipation, diarrhea, nausea and vomiting.  Neurological:  Positive for dizziness (mild). Negative for weakness and headaches.  Psychiatric/Behavioral:  Positive for dysphoric mood (improved). Negative for hallucinations, sleep disturbance and suicidal ideas. The patient is not nervous/anxious.     There were no vitals taken for this visit.There is no height or weight on file to calculate BMI.  General Appearance: Casual and Fairly Groomed  Eye Contact:  Good  Speech:  Clear and Coherent and Normal Rate  Volume:  Normal  Mood:  Anxious  Affect:  Congruent  Thought Process:  Coherent and Goal Directed  Orientation:  Full (Time, Place, and Person)  Thought Content: WDL and Logical   Suicidal Thoughts:  No  Homicidal Thoughts:  No  Memory:  Immediate;   Good Recent;   Good  Judgement:  Good  Insight:  Good  Psychomotor Activity:  Normal  Concentration:  Concentration: Good and Attention Span: Good  Recall:  Good  Fund of Knowledge: Good  Language: Good  Akathisia:  Negative  Handed:  Right  AIMS (if indicated): not done  Assets:  Communication Skills Desire for Improvement Financial Resources/Insurance Housing Resilience Social Support  ADL's:  Intact  Cognition: WNL  Sleep:  Fair   Screenings: AIMS    [place] Admission (Discharged) from 09/08/2018 in [facility]  AIMS Total Score 0      AUDIT    Flowsheet Row Admission (Discharged) from 09/08/2018 in [facility]  Alcohol Use Disorder Identification Test Final Score (AUDIT) 1       GAD-7    Flowsheet Row Counselor from 10/05/2018 in [place] Counselor from 10/02/2018 in [place] Counselor from 09/09/2017 in [facility] Counselor from 04/04/2017 in [place] Counselor from 03/18/2017 in [place]  Total GAD-7 Score _0 PHQ2-9    Flowsheet Row Counselor from 07/01/2022 in [place] Counselor from 06/19/2022 in [place] Office Visit from 05/09/2022 in [place] Video Visit from 06/12/2021 in [facility] Counselor from 11/30/2020 in [facility]  PHQ-2 Total Score _1 PHQ-9 Total Score _2 --      Flowsheet Row Counselor from 07/01/2022 in [place] Counselor from 06/19/2022 in [place] ED from 05/02/2022 in [place] DEPT  C-SSRS RISK CATEGORY Error: Question 6 not populated Low Risk No Risk        Assessment and Plan:   Christine [name] is a 46 yr old female who presents via Virtual Video for follow up and for medication management, she  enrolled in the [facility] 06/24/2022.  PPHx is significant for Bipolar Disorder and Anxiety, a history of Self Injurious Behavior (cutting- last 2020), and 4 Psychiatric Hospitalizations (last [facility] 09/2018), and no history of Suicide Attempts.    "[name]" has had improvement with the increase in her lithium since Monday.  Her speech is not rapid and she is easily able to stay on track during the conversations which was difficult for her on Monday.  We will increase her evening dose of lithium to better control her symptoms.   We will not make any other changes to her medications at this time.  We will continue to monitor.   Bipolar Disorder, current episode Depressed: -Continue Caplyta 42 mg daily for mood stability. No refills sent at this time. -Continue Lamictal 150 mg BID for mood stability.  No refills sent at this time. -Increase Lithium 600 mg BID for depression and mood stability.  No refills sent at this time. -Continue Klonopin 0.5 mg daily PRN for severe anxiety.  No refills sent at this time. -Engage in [place] of Care: Collaboration of Care: Other PHP Program  Patient/Guardian was advised Release of Information must be obtained prior to any record release in order to collaborate their care with an outside provider. Patient/Guardian was advised if they have not already done so to contact the registration department to sign all necessary forms in order for [place] to release information regarding their care.   Consent: Patient/Guardian gives verbal consent for treatment and assignment of benefits for services provided during this visit. Patient/Guardian expressed understanding and agreed to proceed.    [name], MD 07/04/2022, 10:29 AM     Follow Up Instructions:    I discussed the assessment and treatment plan with the patient. The patient was provided an opportunity to ask questions and all were answered. The patient agreed with the plan and demonstrated an understanding of the instructions.   The patient was advised to call back or seek an in-person evaluation if the symptoms worsen or if the condition fails to improve as anticipated.  I provided 15 minutes of non-face-to-face time during this encounter.   [name], MD

## 2022-07-04 NOTE — Therapy (Signed)
[place], [place], [address] Phone: [phone]   Fax:  [phone]  Occupational Therapy Treatment Virtual Visit via Video Note  I connected with [name] on 07/04/22 at  8:00 AM EST by a video enabled telemedicine application and verified that I am speaking with the correct person using two identifiers.  Location: Patient: home Provider: office   I discussed the limitations of evaluation and management by telemedicine and the availability of in person appointments. The patient expressed understanding and agreed to proceed.    The patient was advised to call back or seek an in-person evaluation if the symptoms worsen or if the condition fails to improve as anticipated.  I provided 55 minutes of non-face-to-face time during this encounter.  Patient Details  Name: Christine [name] MRN: [id number] Date of Birth: June 11, 1976 No data recorded  Encounter Date: 07/03/2022   OT End of Session - 07/04/22 0927     Visit Number 6    Number of Visits 20    Date for OT Re-Evaluation 07/25/22    OT Start Time 1200    OT Stop Time 1255    OT Time Calculation (min) 55 min             Past Medical History:  Diagnosis Date   Anemia    Presumably from menorrhagia/menomenorrhagia; has had both blood and iron transfusions   Anxiety    Back pain    Bipolar 1 disorder ([place])    With depression   Chest pain    Depression    Diastolic heart failure ([place])    Essential hypertension    Fibromyalgia    Headache    History of blood transfusion 08/2017   [facility]   History of hiatal hernia    IBS (irritable bowel syndrome)    Infertility, female    Infertility, female    Lactose intolerance    Lower extremity edema    Migraines    Morbid obesity ([place])    Osteoarthritis    Hands, ankles; HLA-B27 positive   PCOS (polycystic ovarian syndrome)    Prediabetes    Seasonal allergies    With recurrent  allergic rhinitis   Sleep apnea    SOB (shortness of breath)    Uterine leiomyoma    Vitamin D deficiency     Past Surgical History:  Procedure Laterality Date   CARDIAC CT ANGIOGRAM  03/2019    Ca Score 0. Normal Coronary origin R dominant. No evidence of CAD. ? Liver nodules - poorly visualized (consider MRI w & w/o Gad contrast)   CERVICAL POLYPECTOMY  01/15/2018   Procedure: CERVICAL POLYPECTOMY;  Surgeon: Arvella Nigh, MD;  Location: [facility];  Service: Gynecology;;   COLONOSCOPY  2008   Normal   DIAGNOSTIC LAPAROSCOPY  08/1999   dermoid cyst, RSO   DILATION AND CURETTAGE OF UTERUS  05/2004   MAB   DILATION AND CURETTAGE OF UTERUS  04/2015   HYSTEROSCOPY WITH D & C  07/22/2011   Procedure: DILATATION AND CURETTAGE /HYSTEROSCOPY;  Surgeon: Cyril Mourning, MD;  Location: [facility];  Service: Gynecology;;   HYSTEROSCOPY WITH D & C N/A 01/15/2018   Procedure: DILATATION AND CURETTAGE [place] WITH MYOSURE;  Surgeon: Arvella Nigh, MD;  Location: [facility];  Service: Gynecology;  Laterality: N/A;   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  Suggestion of high cardiac output (possibly related to anemia thyrotoxicosis, Pregnancy, sepsis etc.) -- was in setting of symptomatic anemia - Hgb 5.7    There were no vitals filed for this visit.   Subjective Assessment - 07/04/22 0927     Currently in Pain? No/denies    Pain Score 0-No pain                Group Session:  S: Feeling better today. Looking forward to this topic.   O: The primary objective of this topic is to explore and understand the concept of occupational balance in the context of daily living. The term "occupational balance" is defined broadly, encompassing all activities that occupy an individual's time and energy, including self-care, leisure, and work-related tasks. The goal is to guide participants towards achieving a harmonious  blend of these activities, tailored to their personal values and life circumstances. This balance is aimed at enhancing overall well-being, not by equally distributing time across activities, but by ensuring that daily engagements are fulfilling and not draining. The content delves into identifying various barriers that individuals face in achieving occupational balance, such as overcommitment, misaligned priorities, external pressures, and lack of effective time management. The impact of these barriers on occupational performance, roles, and lifestyles is examined, highlighting issues like reduced efficiency, strained relationships, and potential health problems. Strategies for cultivating occupational balance are a key focus. These strategies include practical methods like time blocking, prioritizing tasks, establishing self-care rituals, decluttering, connecting with nature, and engaging in reflective practices. These approaches are designed to be adaptable and applicable to a wide range of life scenarios, promoting a proactive and mindful approach to daily living. The overall aim is to equip participants with the knowledge and tools to create a balanced lifestyle that supports their mental, emotional, and physical health, thereby improving their functional performance in daily life.   A:  The patient demonstrated a high level of engagement and active participation throughout the session on occupational balance. The patient frequently contributed to discussions, offering insightful reflections on personal experiences related to the barriers and strategies for achieving occupational balance. There was a clear understanding of the concept and an ability to relate it to their own life. The patient showed enthusiasm in learning and applying the strategies discussed, such as time blocking and self-care rituals, indicating a strong motivation to improve their occupational balance. The patient's proactive  approach and responsiveness to the topic suggest a high potential for implementing these strategies effectively in their daily routine.   P: Continue to attend PHP OT group sessions 5x week for 4 weeks to promote daily structure, social engagement, and opportunities to develop and utilize adaptive strategies to maximize functional performance in preparation for safe transition and integration back into school, work, and the community. Plan to address topic of pt 2 in next OT group session.                   OT Education - 07/04/22 0927     Education Details Occupational Balance              OT Short Term Goals - 06/25/22 1025       OT SHORT TERM GOAL #1   Title Patient will be educated on strategies to improve psychosocial skills needed to participate fully in all daily, work, and leisure activities.    Time 4    Period Weeks    Status On-going    Target Date 07/25/22  OT SHORT TERM GOAL #2   Title Pt will apply psychosocial skills and coping mechanisms to daily activities in order to function independently and reintegrate into community dwelling      OT [place] #3   Title Pt will recall and/or apply 1-3 sleep hygiene strategies to improve BADL routine upon reintegrating into community      OT [address] #4   Title Pt will choose and/or engage in 1-3 socially engaging leisure activities to improve social participation skills upon reintegrating into community      OT [place] #5   Title Pt will engage in goal setting to improve functional BADL/IADL routine upon reintegrating into community                      Plan - 07/04/22 0927     Psychosocial Skills Coping Strategies;Habits;Interpersonal Interaction;Routines and Behaviors             Patient will benefit from skilled therapeutic intervention in order to improve the following deficits and impairments:       Psychosocial Skills: Coping Strategies, Habits,  Interpersonal Interaction, Routines and Behaviors   Visit Diagnosis: Difficulty coping    Problem List Patient Active Problem List   Diagnosis Date Noted   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   Acute on chronic diastolic congestive heart failure ([place]) 03/88/8280   Acute diastolic heart failure ([place])    Pulmonary edema 08/07/2019   Sinus tachycardia 05/17/2019   Atypical chest pain 02/11/2019   Pulmonary hypertension, unspecified ([place]) 02/11/2019   Functional systolic murmur 03/49/1791   Inadequate sleep hygiene 12/08/2018   Psychophysiological insomnia 12/08/2018   Obesity hypoventilation syndrome ([place]) 12/08/2018   OSA (obstructive sleep apnea) 12/08/2018   Chronic migraine without aura without status migrainosus, not intractable 09/21/2018   Bipolar 1 disorder, depressed, severe ([place]) 09/08/2018   Endometrium, polyp 01/15/2018    Class: Present on Admission   Fibroids, submucosal 01/15/2018    Class: Present on Admission   Iron deficiency anemia due to chronic blood loss 09/16/2017   Menorrhagia with regular cycle    Symptomatic anemia 08/28/2017   Acute blood loss anemia 08/28/2017   Bipolar disorder ([place]) 08/28/2017   Anxiety 08/28/2017   Depression with anxiety 08/28/2017   Fibroids 08/28/2017   PCOS (polycystic ovarian syndrome) 08/28/2017   Fever 08/28/2017   HTN (hypertension) 08/28/2017   Morbid obesity ([place]) 12/04/2015   Pregnancy with history of uterine myomectomy 04/07/2015   History of right oophorectomy 02/21/2015   History of recurrent miscarriages 02/01/2015   Vitamin D deficiency 02/01/2015    Brantley Stage, OT 07/04/2022, 9:29 AM [address], [place], [place], [address] Phone: [phone]   Fax:  [phone]  Name: Debara Kamphuis MRN: [id number] Date of Birth: 1976-07-22

## 2022-07-05 ENCOUNTER — Other Ambulatory Visit (HOSPITAL_COMMUNITY): Payer: Medicare Other | Attending: Psychiatry

## 2022-07-05 ENCOUNTER — Encounter (HOSPITAL_COMMUNITY): Payer: Self-pay

## 2022-07-05 ENCOUNTER — Other Ambulatory Visit (HOSPITAL_COMMUNITY): Payer: Medicare Other | Admitting: Licensed Clinical Social Worker

## 2022-07-05 DIAGNOSIS — Z9152 Personal history of nonsuicidal self-harm: Secondary | ICD-10-CM | POA: Insufficient documentation

## 2022-07-05 DIAGNOSIS — F3162 Bipolar disorder, current episode mixed, moderate: Secondary | ICD-10-CM | POA: Insufficient documentation

## 2022-07-05 DIAGNOSIS — F411 Generalized anxiety disorder: Secondary | ICD-10-CM | POA: Insufficient documentation

## 2022-07-05 DIAGNOSIS — R4589 Other symptoms and signs involving emotional state: Secondary | ICD-10-CM

## 2022-07-05 DIAGNOSIS — F32A Depression, unspecified: Secondary | ICD-10-CM | POA: Diagnosis present

## 2022-07-05 DIAGNOSIS — F314 Bipolar disorder, current episode depressed, severe, without psychotic features: Secondary | ICD-10-CM | POA: Diagnosis not present

## 2022-07-05 NOTE — Therapy (Signed)
[place], [place], [address] Phone: [phone]   Fax:  [phone]  Occupational Therapy Treatment Virtual Visit via Video Note  I connected with Christine [name] on 07/05/22 at  8:00 AM EST by a video enabled telemedicine application and verified that I am speaking with the correct person using two identifiers.  Location: Patient: home Provider: office   I discussed the limitations of evaluation and management by telemedicine and the availability of in person appointments. The patient expressed understanding and agreed to proceed.    The patient was advised to call back or seek an in-person evaluation if the symptoms worsen or if the condition fails to improve as anticipated.  I provided 55 minutes of non-face-to-face time during this encounter.   Patient Details  Name: Christine [name] MRN: [id number] Date of Birth: 1975-11-08 No data recorded  Encounter Date: 07/05/2022   OT End of Session - 07/05/22 2255     Visit Number 8    Number of Visits 20    Date for OT Re-Evaluation 07/25/22    OT Start Time 1200    OT Stop Time 1255    OT Time Calculation (min) 55 min             Past Medical History:  Diagnosis Date   Anemia    Presumably from menorrhagia/menomenorrhagia; has had both blood and iron transfusions   Anxiety    Back pain    Bipolar 1 disorder ([place])    With depression   Chest pain    Depression    Diastolic heart failure ([place])    Essential hypertension    Fibromyalgia    Headache    History of blood transfusion 08/2017   WL   History of hiatal hernia    IBS (irritable bowel syndrome)    Infertility, female    Infertility, female    Lactose intolerance    Lower extremity edema    Migraines    Morbid obesity (H[facility])    Osteoarthritis    Hands, ankles; HLA-B27 positive   PCOS (polycystic ovarian syndrome)    Prediabetes    Seasonal allergies    With recurrent  allergic rhinitis   Sleep apnea    SOB (shortness of breath)    Uterine leiomyoma    Vitamin D deficiency     Past Surgical History:  Procedure Laterality Date   CARDIAC CT ANGIOGRAM  03/2019    Ca Score 0. Normal Coronary origin R dominant. No evidence of CAD. ? Liver nodules - poorly visualized (consider MRI w & w/o Gad contrast)   CERVICAL POLYPECTOMY  01/15/2018   Procedure: CERVICAL POLYPECTOMY;  Surgeon: Arvella Nigh, MD;  Location: [facility];  Service: Gynecology;;   COLONOSCOPY  2008   Normal   DIAGNOSTIC LAPAROSCOPY  08/1999   dermoid cyst, RSO   DILATION AND CURETTAGE OF UTERUS  05/2004   MAB   DILATION AND CURETTAGE OF UTERUS  04/2015   HYSTEROSCOPY WITH D & C  07/22/2011   Procedure: DILATATION AND CURETTAGE /HYSTEROSCOPY;  Surgeon: Cyril Mourning, MD;  Location: [facility];  Service: Gynecology;;   HYSTEROSCOPY WITH D & C N/A 01/15/2018   Procedure: DILATATION AND CURETTAGE [place] WITH MYOSURE;  Surgeon: Arvella Nigh, MD;  Location: [facility];  Service: Gynecology;  Laterality: N/A;   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  Suggestion of high cardiac output (possibly related to anemia thyrotoxicosis, Pregnancy, sepsis etc.) -- was in setting of symptomatic anemia - Hgb 5.7    There were no vitals filed for this visit.   Subjective Assessment - 07/05/22 2255     Currently in Pain? No/denies    Pain Score 0-No pain               Group Session:  S: Doing a bit better today  O: The primary objective of this group therapy session is to equip participants with practical strategies and coping mechanisms to effectively manage accumulated tasks that seem insurmountable due to mental health challenges such as depression and anxiety. The group aims to build a supportive environment wherein individuals can openly discuss their struggles, share experiences, and learn from one another.  The session's strategies will encompass goal setting, time management, task breakdown, creating conducive environments, and mindfulness practices. We aim to help participants perceive tasks as manageable units rather than overwhelming piles and encourage an approach of progress over perfection.   A: Patient demonstrated active engagement throughout the session. They contributed valuable insights during discussions, asked relevant questions, and showed enthusiasm towards learning new strategies. Their interaction with other group members was respectful and empathetic, contributing to a supportive group dynamic. Patient appeared to resonate with the strategies of "breaking it down" and "creating a conducive environment," and they shared plans to incorporate these strategies into their daily routine. Their active participation, willingness to share personal experiences, and acceptance of new strategies demonstrate a strong benefit from this therapy session.   P: Continue to attend PHP OT group sessions 5x week for 4 weeks to promote daily structure, social engagement, and opportunities to develop and utilize adaptive strategies to maximize functional performance in preparation for safe transition and integration back into school, work, and the community. Plan to address topic of pt 2 in next OT group session.                    OT Education - 07/05/22 2255     Education Details Task Mgmt              OT Short Term Goals - 06/25/22 1025       OT SHORT TERM GOAL #1   Title Patient will be educated on strategies to improve psychosocial skills needed to participate fully in all daily, work, and leisure activities.    Time 4    Period Weeks    Status On-going    Target Date 07/25/22      OT SHORT TERM GOAL #2   Title Pt will apply psychosocial skills and coping mechanisms to daily activities in order to function independently and reintegrate into community dwelling      OT  [place] #3   Title Pt will recall and/or apply 1-3 sleep hygiene strategies to improve BADL routine upon reintegrating into community      OT [place] #4   Title Pt will choose and/or engage in 1-3 socially engaging leisure activities to improve social participation skills upon reintegrating into community      OT [place] #5   Title Pt will engage in goal setting to improve functional BADL/IADL routine upon reintegrating into community                      Plan - 07/05/22 2256     Psychosocial Skills Coping Strategies;Habits;Interpersonal Interaction;Routines  and Behaviors             Patient will benefit from skilled therapeutic intervention in order to improve the following deficits and impairments:       Psychosocial Skills: Coping Strategies, Habits, Interpersonal Interaction, Routines and Behaviors   Visit Diagnosis: Difficulty coping    Problem List Patient Active Problem List   Diagnosis Date Noted   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   Acute on chronic diastolic congestive heart failure ([place]) 37/54/3606   Acute diastolic heart failure ([place])    Pulmonary edema 08/07/2019   Sinus tachycardia 05/17/2019   Atypical chest pain 02/11/2019   Pulmonary hypertension, unspecified ([place]) 02/11/2019   Functional systolic murmur 77/10/4033   Inadequate sleep hygiene 12/08/2018   Psychophysiological insomnia 12/08/2018   Obesity hypoventilation syndrome ([place]) 12/08/2018   OSA (obstructive sleep apnea) 12/08/2018   Chronic migraine without aura without status migrainosus, not intractable 09/21/2018   Bipolar 1 disorder, depressed, severe ([place]) 09/08/2018   Endometrium, polyp 01/15/2018    Class: Present on Admission   Fibroids, submucosal 01/15/2018    Class: Present on Admission   Iron deficiency anemia due to chronic blood loss 09/16/2017   Menorrhagia with regular cycle    Symptomatic anemia  08/28/2017   Acute blood loss anemia 08/28/2017   Bipolar disorder ([place]) 08/28/2017   Anxiety 08/28/2017   Depression with anxiety 08/28/2017   Fibroids 08/28/2017   PCOS (polycystic ovarian syndrome) 08/28/2017   Fever 08/28/2017   HTN (hypertension) 08/28/2017   Morbid obesity ([place]) 12/04/2015   Pregnancy with history of uterine myomectomy 04/07/2015   History of right oophorectomy 02/21/2015   History of recurrent miscarriages 02/01/2015   Vitamin D deficiency 02/01/2015    Brantley Stage, OT 07/05/2022, 10:56 PM  [facility], [place], [place], [address] Phone: [phone]   Fax:  [phone]  Name: Christine [name] MRN: [id number] Date of Birth: 1976-05-17

## 2022-07-05 NOTE — Therapy (Signed)
[place], [place], [address] Phone: [phone]   Fax:  [phone]  Occupational Therapy Treatment Virtual Visit via Video Note  I connected with Christine [name] on 07/05/22 at  8:00 AM EST by a video enabled telemedicine application and verified that I am speaking with the correct person using two identifiers.  Location: Patient: home Provider: office   I discussed the limitations of evaluation and management by telemedicine and the availability of in person appointments. The patient expressed understanding and agreed to proceed.    The patient was advised to call back or seek an in-person evaluation if the symptoms worsen or if the condition fails to improve as anticipated.  I provided 55 minutes of non-face-to-face time during this encounter.   Patient Details  Name: Christine [name] MRN: [id number] Date of Birth: June 05, 1976 No data recorded  Encounter Date: 07/04/2022   OT End of Session - 07/05/22 2136     Visit Number 7    Number of Visits 20    Date for OT Re-Evaluation 07/25/22    OT Start Time 1200    OT Stop Time 1255    OT Time Calculation (min) 55 min             Past Medical History:  Diagnosis Date   Anemia    Presumably from menorrhagia/menomenorrhagia; has had both blood and iron transfusions   Anxiety    Back pain    Bipolar 1 disorder ([place])    With depression   Chest pain    Depression    Diastolic heart failure ([place])    Essential hypertension    Fibromyalgia    Headache    History of blood transfusion 08/2017   WL   History of hiatal hernia    IBS (irritable bowel syndrome)    Infertility, female    Infertility, female    Lactose intolerance    Lower extremity edema    Migraines    Morbid obesity (H[facility])    Osteoarthritis    Hands, ankles; HLA-B27 positive   PCOS (polycystic ovarian syndrome)    Prediabetes    Seasonal allergies    With recurrent  allergic rhinitis   Sleep apnea    SOB (shortness of breath)    Uterine leiomyoma    Vitamin D deficiency     Past Surgical History:  Procedure Laterality Date   CARDIAC CT ANGIOGRAM  03/2019    Ca Score 0. Normal Coronary origin R dominant. No evidence of CAD. ? Liver nodules - poorly visualized (consider MRI w & w/o Gad contrast)   CERVICAL POLYPECTOMY  01/15/2018   Procedure: CERVICAL POLYPECTOMY;  Surgeon: Arvella Nigh, MD;  Location: [facility];  Service: Gynecology;;   COLONOSCOPY  2008   Normal   DIAGNOSTIC LAPAROSCOPY  08/1999   dermoid cyst, RSO   DILATION AND CURETTAGE OF UTERUS  05/2004   MAB   DILATION AND CURETTAGE OF UTERUS  04/2015   HYSTEROSCOPY WITH D & C  07/22/2011   Procedure: DILATATION AND CURETTAGE /HYSTEROSCOPY;  Surgeon: Cyril Mourning, MD;  Location: [facility];  Service: Gynecology;;   HYSTEROSCOPY WITH D & C N/A 01/15/2018   Procedure: DILATATION AND CURETTAGE [place] WITH MYOSURE;  Surgeon: Arvella Nigh, MD;  Location: [facility];  Service: Gynecology;  Laterality: N/A;   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  Suggestion of high cardiac output (possibly related to anemia thyrotoxicosis, Pregnancy, sepsis etc.) -- was in setting of symptomatic anemia - Hgb 5.7    There were no vitals filed for this visit.   Subjective Assessment - 07/05/22 2135     Currently in Pain? No/denies    Pain Score 0-No pain                Group Session:  S: Doing a little bit better today  O: The primary objective of this topic is to explore and understand the concept of occupational balance in the context of daily living. The term "occupational balance" is defined broadly, encompassing all activities that occupy an individual's time and energy, including self-care, leisure, and work-related tasks. The goal is to guide participants towards achieving a harmonious blend of these  activities, tailored to their personal values and life circumstances. This balance is aimed at enhancing overall well-being, not by equally distributing time across activities, but by ensuring that daily engagements are fulfilling and not draining. The content delves into identifying various barriers that individuals face in achieving occupational balance, such as overcommitment, misaligned priorities, external pressures, and lack of effective time management. The impact of these barriers on occupational performance, roles, and lifestyles is examined, highlighting issues like reduced efficiency, strained relationships, and potential health problems. Strategies for cultivating occupational balance are a key focus. These strategies include practical methods like time blocking, prioritizing tasks, establishing self-care rituals, decluttering, connecting with nature, and engaging in reflective practices. These approaches are designed to be adaptable and applicable to a wide range of life scenarios, promoting a proactive and mindful approach to daily living. The overall aim is to equip participants with the knowledge and tools to create a balanced lifestyle that supports their mental, emotional, and physical health, thereby improving their functional performance in daily life.   A:  The patient demonstrated a high level of engagement and active participation throughout the session on occupational balance. The patient frequently contributed to discussions, offering insightful reflections on personal experiences related to the barriers and strategies for achieving occupational balance. There was a clear understanding of the concept and an ability to relate it to their own life. The patient showed enthusiasm in learning and applying the strategies discussed, such as time blocking and self-care rituals, indicating a strong motivation to improve their occupational balance. The patient's proactive approach and  responsiveness to the topic suggest a high potential for implementing these strategies effectively in their daily routine.   P: Continue to attend PHP OT group sessions 5x week for 4 weeks to promote daily structure, social engagement, and opportunities to develop and utilize adaptive strategies to maximize functional performance in preparation for safe transition and integration back into school, work, and the community. Plan to address topic of Task Mgmt in next OT group session.                   OT Education - 07/05/22 2136     Education Details Occupational Balance              OT Short Term Goals - 06/25/22 1025       OT SHORT TERM GOAL #1   Title Patient will be educated on strategies to improve psychosocial skills needed to participate fully in all daily, work, and leisure activities.    Time 4    Period Weeks    Status On-going    Target Date 07/25/22  OT SHORT TERM GOAL #2   Title Pt will apply psychosocial skills and coping mechanisms to daily activities in order to function independently and reintegrate into community dwelling      OT [place] #3   Title Pt will recall and/or apply 1-3 sleep hygiene strategies to improve BADL routine upon reintegrating into community      OT [place] #4   Title Pt will choose and/or engage in 1-3 socially engaging leisure activities to improve social participation skills upon reintegrating into community      OT [place] #5   Title Pt will engage in goal setting to improve functional BADL/IADL routine upon reintegrating into community                      Plan - 07/05/22 2136     Psychosocial Skills Coping Strategies;Habits;Interpersonal Interaction;Routines and Behaviors             Patient will benefit from skilled therapeutic intervention in order to improve the following deficits and impairments:       Psychosocial Skills: Coping Strategies, Habits, Interpersonal  Interaction, Routines and Behaviors   Visit Diagnosis: Difficulty coping    Problem List Patient Active Problem List   Diagnosis Date Noted   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   Acute on chronic diastolic congestive heart failure ([place]) 16/38/4536   Acute diastolic heart failure ([place])    Pulmonary edema 08/07/2019   Sinus tachycardia 05/17/2019   Atypical chest pain 02/11/2019   Pulmonary hypertension, unspecified ([place]) 02/11/2019   Functional systolic murmur 46/80/3212   Inadequate sleep hygiene 12/08/2018   Psychophysiological insomnia 12/08/2018   Obesity hypoventilation syndrome ([place]) 12/08/2018   OSA (obstructive sleep apnea) 12/08/2018   Chronic migraine without aura without status migrainosus, not intractable 09/21/2018   Bipolar 1 disorder, depressed, severe ([place]) 09/08/2018   Endometrium, polyp 01/15/2018    Class: Present on Admission   Fibroids, submucosal 01/15/2018    Class: Present on Admission   Iron deficiency anemia due to chronic blood loss 09/16/2017   Menorrhagia with regular cycle    Symptomatic anemia 08/28/2017   Acute blood loss anemia 08/28/2017   Bipolar disorder ([place]) 08/28/2017   Anxiety 08/28/2017   Depression with anxiety 08/28/2017   Fibroids 08/28/2017   PCOS (polycystic ovarian syndrome) 08/28/2017   Fever 08/28/2017   HTN (hypertension) 08/28/2017   Morbid obesity ([place]) 12/04/2015   Pregnancy with history of uterine myomectomy 04/07/2015   History of right oophorectomy 02/21/2015   History of recurrent miscarriages 02/01/2015   Vitamin D deficiency 02/01/2015    Brantley Stage, OT 07/05/2022, 9:37 PM  [facility], [place], [place], [address] Phone: [phone]   Fax:  [phone]  Name: Christine [name] MRN: [id number] Date of Birth: [birth year]

## 2022-07-08 ENCOUNTER — Other Ambulatory Visit (HOSPITAL_COMMUNITY): Payer: Medicare Other | Admitting: Licensed Clinical Social Worker

## 2022-07-08 ENCOUNTER — Other Ambulatory Visit (HOSPITAL_COMMUNITY): Payer: Medicare Other

## 2022-07-08 ENCOUNTER — Encounter (HOSPITAL_COMMUNITY): Payer: Self-pay

## 2022-07-08 DIAGNOSIS — F314 Bipolar disorder, current episode depressed, severe, without psychotic features: Secondary | ICD-10-CM

## 2022-07-08 DIAGNOSIS — R4589 Other symptoms and signs involving emotional state: Secondary | ICD-10-CM

## 2022-07-08 DIAGNOSIS — F3162 Bipolar disorder, current episode mixed, moderate: Secondary | ICD-10-CM

## 2022-07-08 DIAGNOSIS — F411 Generalized anxiety disorder: Secondary | ICD-10-CM

## 2022-07-08 NOTE — Progress Notes (Unsigned)
Spoke with patient via WebEx video call, used 2 identifiers to correctly identify patient. States that groups are good. Had manic symptoms over the weekend after some medication changes. Was having trouble concentrating, racing thoughts, and was hyperverbal.  She will speak with the psychiatrist again Thursday to see if she has had any improvement or if changes need to be made with medications. On scale 1-10 as 10 being worst she rates depression at 4 and anxiety at 7. Denies SI/HI or AV hallucinations. No issues or complaints.

## 2022-07-08 NOTE — Progress Notes (Signed)
BH MD/PA/NP OP Progress Note   Virtual Visit via Video Note  I connected with Christine [name] on 07/08/22 at  9:00 AM EST by a video enabled telemedicine application and verified that I am speaking with the correct person using two identifiers.  Location: Patient: Home Provider: [facility]   I discussed the limitations of evaluation and management by telemedicine and the availability of in person appointments. The patient expressed understanding and agreed to proceed.   07/08/2022 9:05 AM Christine [name]  MRN:  [id number]  Chief Complaint:  Chief Complaint  Patient presents with   Follow-up   Manic Behavior   Anxiety   HPI:  Christine [name] is a 46 yr old female who presents via Virtual Video for follow up and for medication management, she enrolled in the [facility] Program 06/24/2022.  PPHx is significant for Bipolar Disorder and Anxiety, a history of Self Injurious Behavior (cutting- last 2020), and 4 Psychiatric Hospitalizations (last [facility] 09/2018), and no history of Suicide Attempts.    She reports that she is still having some issues.  She reports that she was having racing thoughts over the weekend and that Sunday her family was telling her to calm down because she was talking fast and more irritable.  She reports she is having some memory issues.  She reports she is also had some intrusive thoughts of self-harm but reports immediately dismissing these thoughts and that she has no intent of acting on them.  Discussed with her that we did just increase her lithium and so would like to give this a little more time to stabilize her symptoms.  Discussed that it seems like she did have some worsening during the week and she is already improving so would not want to make any medication changes at this time.  Discussed that if we do need to make further changes we would have to get a lithium level but for now we would not make any changes and she was agreeable to this.  She reports no SI, HI, or  AVH.  She reports her sleep is good.  She reports her appetite is doing good.  She reports no other concerns at present.   Visit Diagnosis:    ICD-10-CM   1. Bipolar 1 disorder, mixed, moderate (HCC)  F31.62     2. Generalized anxiety disorder  F41.1       Past Psychiatric History: Bipolar Disorder and Anxiety, a history of Self Injurious Behavior (cutting- last 2020), and 4 Psychiatric Hospitalizations (last [facility] 09/2018), and no history of Suicide Attempts.    Past Medical History:  Past Medical History:  Diagnosis Date   Anemia    Presumably from menorrhagia/menomenorrhagia; has had both blood and iron transfusions   Anxiety    Back pain    Bipolar 1 disorder (HCC)    With depression   Chest pain    Depression    Diastolic heart failure ([place])    Essential hypertension    Fibromyalgia    Headache    History of blood transfusion 08/2017   [facility]   History of hiatal hernia    IBS (irritable bowel syndrome)    Infertility, female    Infertility, female    Lactose intolerance    Lower extremity edema    Migraines    Morbid obesity (HCC)    Osteoarthritis    Hands, ankles; HLA-B27 positive   PCOS (polycystic ovarian syndrome)    Prediabetes    Seasonal allergies    With  recurrent allergic rhinitis   Sleep apnea    SOB (shortness of breath)    Uterine leiomyoma    Vitamin D deficiency     Past Surgical History:  Procedure Laterality Date   CARDIAC CT ANGIOGRAM  03/2019    Ca Score 0. Normal Coronary origin R dominant. No evidence of CAD. ? Liver nodules - poorly visualized (consider MRI w & w/o Gad contrast)   CERVICAL POLYPECTOMY  01/15/2018   Procedure: CERVICAL POLYPECTOMY;  Surgeon: Arvella Nigh, MD;  Location: [facility];  Service: Gynecology;;   COLONOSCOPY  2008   Normal   DIAGNOSTIC LAPAROSCOPY  08/1999   dermoid cyst, RSO   DILATION AND CURETTAGE OF UTERUS  05/2004   MAB   DILATION AND CURETTAGE OF UTERUS  04/2015   HYSTEROSCOPY WITH D & C  07/22/2011    Procedure: DILATATION AND CURETTAGE /HYSTEROSCOPY;  Surgeon: Cyril Mourning, MD;  Location: [facility];  Service: Gynecology;;   HYSTEROSCOPY WITH D & C N/A 01/15/2018   Procedure: DILATATION AND CURETTAGE [place] WITH MYOSURE;  Surgeon: Arvella Nigh, MD;  Location: [facility];  Service: Gynecology;  Laterality: N/A;   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  Suggestion of high cardiac output (possibly related to anemia thyrotoxicosis, Pregnancy, sepsis etc.) -- was in setting of symptomatic anemia - Hgb 5.7    Family Psychiatric History: Mother- Depression, Anxiety No Known history of Substance Abuse or Suicides.  Family History:  Family History  Problem Relation Age of Onset   Anxiety disorder Mother    Depression Mother    Cancer Mother        pancreatic    High blood pressure Mother    Hypertension Mother    Hyperlipidemia Mother    Cancer Father        colon   High blood pressure Father    Diabetes Father    Hypertension Father    Hyperlipidemia Father    Heart disease Father    Stroke Father    Kidney disease Father    Sleep apnea Father    Obesity Father    Cancer Brother 6       leukemia, childhood   High blood pressure Brother    Migraines Neg Hx     Social History:  Social History   Socioeconomic History   Marital status: Married    Spouse name: Zelda Reames   Number of children: 2   Years of education: 2 years of grad school + bach & HS   Highest education level: Bachelor's degree (e.g., BA, AB, BS)  Occupational History   Occupation: disabled   Occupation: Part time Naval architect  Tobacco Use   Smoking status: Never   Smokeless tobacco: Never  Vaping Use   Vaping Use: Never used  Substance and Sexual Activity   Alcohol use: Yes    Comment: occasional, maybe once a month or so   Drug use: No   Sexual activity: Yes    Partners: Male    Birth  control/protection: None  Other Topics Concern   Not on file  Social History Narrative   ** Merged History Encounter **       Lives at home with her husband and 2 adopted daughters. Right handed Caffeine: decreased intake, drinks rarely  Is on long-term disability.   Social Determinants of Health   Financial Resource Strain: Medium  Risk (09/10/2017)   Overall Financial Resource Strain (CARDIA)    Difficulty of Paying Living Expenses: Somewhat hard  Food Insecurity: Food Insecurity Present (09/10/2017)   Hunger Vital Sign    Worried About Running Out of Food in the Last Year: Sometimes true    Ran Out of Food in the Last Year: Sometimes true  Transportation Needs: No Transportation Needs (09/10/2017)   PRAPARE - Hydrologist (Medical): No    Lack of Transportation (Non-Medical): No  Physical Activity: Unknown (09/10/2017)   Exercise Vital Sign    Days of Exercise per Week: 0 days    Minutes of Exercise per Session: Not on file  Stress: Stress Concern Present (09/10/2017)   [place]    Feeling of Stress : Rather much  Social Connections: Unknown (09/10/2017)   Social Connection and Isolation Panel [NHANES]    Frequency of Communication with Friends and Family: Never    Frequency of Social Gatherings with Friends and Family: Never    Attends Religious Services: Never    Marine scientist or Organizations: No    Attends Archivist Meetings: Never    Marital Status: Not on file    Allergies:  Allergies  Allergen Reactions   Penicillins Rash and Other (See Comments)    Has patient had a PCN reaction causing immediate rash, facial/tongue/throat swelling, SOB or lightheadedness with hypotension: Yes Has patient had a PCN reaction causing severe rash involving mucus membranes or skin necrosis: Unknown Has patient had a PCN reaction that required hospitalization: No Has patient had  a PCN reaction occurring within the last 10 years: No If all of the above answers are "NO", then may proceed with Cephalosporin use.     Metabolic Disorder Labs: Lab Results  Component Value Date   HGBA1C 5.7 05/02/2022   No results found for: "PROLACTIN" Lab Results  Component Value Date   CHOL 178 05/09/2022   TRIG 81 05/09/2022   HDL 38 (L) 05/09/2022   LDLCALC 125 (H) 05/09/2022   LDLCALC 131 (H) 10/07/2018   Lab Results  Component Value Date   TSH 1.55 05/02/2022   TSH 1.215 08/08/2019    Therapeutic Level Labs: Lab Results  Component Value Date   LITHIUM 0.12 (L) 08/07/2019   LITHIUM 0.4 (L) 10/02/2018   No results found for: "VALPROATE" No results found for: "CBMZ"  Current Medications: Current Outpatient Medications  Medication Sig Dispense Refill   acetaminophen (TYLENOL) 325 MG tablet Take 650 mg by mouth every 6 (six) hours as needed for mild pain or headache.     amLODipine (NORVASC) 5 MG tablet Take 1 tablet (5 mg total) by mouth daily. 30 tablet 0   CAPLYTA 42 MG capsule Take 42 mg by mouth daily.     chlorthalidone (HYGROTON) 25 MG tablet TAKE 1/2 TABLET(12.5 MG) BY MOUTH DAILY 45 tablet 0   clonazePAM (KLONOPIN) 0.5 MG tablet Take 1 tablet (0.5 mg total) by mouth daily as needed for anxiety. 30 tablet 0   fluticasone (FLONASE) 50 MCG/ACT nasal spray 1 spray in each nostril     gabapentin (NEURONTIN) 100 MG capsule Take 100 mg by mouth 3 (three) times daily. (Patient not taking: Reported on 07/03/2022)     lamoTRIgine (LAMICTAL) 150 MG tablet Take 1 tablet (150 mg total) by mouth 2 (two) times daily. For mood 60 tablet 2   lithium carbonate (LITHOBID) 300 MG CR tablet Take  2 tablets (600 mg total) by mouth 2 (two) times daily. Take 2 tablets (600 mg) every morning and 1 tablet (300 mg) at night 90 tablet 0   metFORMIN (GLUCOPHAGE) 500 MG tablet Take 1 tablet (500 mg total) by mouth 2 (two) times daily with a meal. 60 tablet 0   metoprolol succinate  (TOPROL-XL) 100 MG 24 hr tablet TAKE 1 TABLET BY MOUTH DAILY WITH OR IMMEDIATELY FOLLOWING A MEAL 90 tablet 1   Vitamin D, Ergocalciferol, (DRISDOL) 1.25 MG (50000 UNIT) CAPS capsule Take 1 capsule (50,000 Units total) by mouth every 7 (seven) days. 5 capsule    No current facility-administered medications for this visit.     Musculoskeletal: Strength & Muscle Tone: within normal limits Gait & Station:  sitting during interview Patient leans: N/A  Psychiatric Specialty Exam: Review of Systems  Respiratory:  Negative for shortness of breath.   Cardiovascular:  Negative for chest pain.  Gastrointestinal:  Negative for abdominal pain, constipation, diarrhea, nausea and vomiting.  Neurological:  Negative for dizziness, weakness and headaches.  Psychiatric/Behavioral:  Positive for dysphoric mood and sleep disturbance. Negative for hallucinations and suicidal ideas. The patient is nervous/anxious.     There were no vitals taken for this visit.There is no height or weight on file to calculate BMI.  General Appearance: Casual and Fairly Groomed  Eye Contact:  Good  Speech:  Clear and Coherent and slightly rapid rate  Volume:  Normal  Mood:  Anxious  Affect:  Appropriate and Congruent  Thought Process:  Coherent and Goal Directed  Orientation:  Full (Time, Place, and Person)  Thought Content: WDL and Logical   Suicidal Thoughts:  No  Homicidal Thoughts:  No  Memory:  Immediate;   Good Recent;   Good  Judgement:  Good  Insight:  Good  Psychomotor Activity:  Normal  Concentration:  Concentration: Good and Attention Span: Good  Recall:  Good  Fund of Knowledge: Good  Language: Good  Akathisia:  Negative  Handed:  Right  AIMS (if indicated): not done  Assets:  Communication Skills Desire for Improvement Financial Resources/Insurance Housing Resilience Social Support  ADL's:  Intact  Cognition: WNL  Sleep:  Good   Screenings: AIMS    Flowsheet Row Admission (Discharged)  from 09/08/2018 in [facility]  AIMS Total Score 0      AUDIT    Flowsheet Row Admission (Discharged) from 09/08/2018 in [facility]  Alcohol Use Disorder Identification Test Final Score (AUDIT) 1      GAD-7    Flowsheet Row Counselor from 10/05/2018 in [facility] Counselor from 10/02/2018 in [facility] Counselor from 09/09/2017 in [place] Counselor from 04/04/2017 in [facility] Counselor from 03/18/2017 in [facility]  Total GAD-7 Score _0 PHQ2-9    Flowsheet Row Counselor from 07/01/2022 in [place] Counselor from 06/19/2022 in [place] Office Visit from 05/09/2022 in [place] Video Visit from 06/12/2021 in [facility] Counselor from 11/30/2020 in [place]  PHQ-2 Total Score _1 PHQ-9 Total Score _2 --      Flowsheet Row Counselor from 07/01/2022 in [place] Counselor from 06/19/2022 in [facility] ED  from 05/02/2022 in [facility] DEPT  C-SSRS RISK CATEGORY Error: Question 6 not populated Low Risk No Risk        Assessment and Plan:  Christine [name] is a 46 yr old female who presents via Virtual Video for follow up and for medication management, she enrolled in the [facility] 06/24/2022.  PPHx is significant for Bipolar Disorder and Anxiety, a history of Self Injurious Behavior (cutting- last 2020), and 4 Psychiatric Hospitalizations (last [facility] 09/2018), and no history of Suicide Attempts.     Jadan did have a  worsening of her symptoms on Sunday but does seem to be improving at this time.  Given the recent increase in her lithium we will await any further medication changes at this time and will follow up on Thursday.  If further medication changes do need to be made we will need to obtain a lithium level.  We will continue to monitor.   Bipolar Disorder, current episode Depressed: -Continue Caplyta 42 mg daily for mood stability. No refills sent at this time. -Continue Lamictal 150 mg BID for mood stability.  No refills sent at this time. -Continue Lithium 600 mg BID for depression and mood stability.  No refills sent at this time. -Continue Klonopin 0.5 mg daily PRN for severe anxiety.  No refills sent at this time. -Engage in [place] of Care: Collaboration of Care: Other PHP Program  Patient/Guardian was advised Release of Information must be obtained prior to any record release in order to collaborate their care with an outside provider. Patient/Guardian was advised if they have not already done so to contact the registration department to sign all necessary forms in order for [place] to release information regarding their care.   Consent: Patient/Guardian gives verbal consent for treatment and assignment of benefits for services provided during this visit. Patient/Guardian expressed understanding and agreed to proceed.    [name], MD 07/08/2022, 9:05 AM    Follow Up Instructions:    I discussed the assessment and treatment plan with the patient. The patient was provided an opportunity to ask questions and all were answered. The patient agreed with the plan and demonstrated an understanding of the instructions.   The patient was advised to call back or seek an in-person evaluation if the symptoms worsen or if the condition fails to improve as anticipated.  I provided 15 minutes of non-face-to-face time during this encounter.   [name], MD

## 2022-07-09 ENCOUNTER — Other Ambulatory Visit (HOSPITAL_COMMUNITY): Payer: Medicare Other

## 2022-07-09 ENCOUNTER — Encounter (HOSPITAL_COMMUNITY): Payer: Self-pay

## 2022-07-09 DIAGNOSIS — F3131 Bipolar disorder, current episode depressed, mild: Secondary | ICD-10-CM | POA: Diagnosis not present

## 2022-07-09 DIAGNOSIS — F411 Generalized anxiety disorder: Secondary | ICD-10-CM | POA: Diagnosis not present

## 2022-07-09 NOTE — Therapy (Signed)
[place], [place], [address] Phone: [phone]   Fax:  [phone]  Occupational Therapy Treatment Virtual Visit via Video Note  I connected with [name] on 07/09/22 at  8:00 AM EST by a video enabled telemedicine application and verified that I am speaking with the correct person using two identifiers.  Location: Patient: home Provider: office   I discussed the limitations of evaluation and management by telemedicine and the availability of in person appointments. The patient expressed understanding and agreed to proceed.    The patient was advised to call back or seek an in-person evaluation if the symptoms worsen or if the condition fails to improve as anticipated.  I provided 55 minutes of non-face-to-face time during this encounter.   Patient Details  Name: Christine [name] MRN: [id number] Date of Birth: [birth year] No data recorded  Encounter Date: 07/08/2022   OT End of Session - 07/09/22 0843     Visit Number 9    Number of Visits 20    Date for OT Re-Evaluation 07/25/22    OT Start Time 1200    OT Stop Time 1255    OT Time Calculation (min) 55 min             Past Medical History:  Diagnosis Date   Anemia    Presumably from menorrhagia/menomenorrhagia; has had both blood and iron transfusions   Anxiety    Back pain    Bipolar 1 disorder ([place])    With depression   Chest pain    Depression    Diastolic heart failure ([place])    Essential hypertension    Fibromyalgia    Headache    History of blood transfusion 08/2017   [facility]   History of hiatal hernia    IBS (irritable bowel syndrome)    Infertility, female    Infertility, female    Lactose intolerance    Lower extremity edema    Migraines    Morbid obesity (HCC)    Osteoarthritis    Hands, ankles; HLA-B27 positive   PCOS (polycystic ovarian syndrome)    Prediabetes    Seasonal allergies    With recurrent  allergic rhinitis   Sleep apnea    SOB (shortness of breath)    Uterine leiomyoma    Vitamin D deficiency     Past Surgical History:  Procedure Laterality Date   CARDIAC CT ANGIOGRAM  03/2019    Ca Score 0. Normal Coronary origin R dominant. No evidence of CAD. ? Liver nodules - poorly visualized (consider MRI w & w/o Gad contrast)   CERVICAL POLYPECTOMY  01/15/2018   Procedure: CERVICAL POLYPECTOMY;  Surgeon: Arvella Nigh, MD;  Location: [facility];  Service: Gynecology;;   COLONOSCOPY  2008   Normal   DIAGNOSTIC LAPAROSCOPY  08/1999   dermoid cyst, RSO   DILATION AND CURETTAGE OF UTERUS  05/2004   MAB   DILATION AND CURETTAGE OF UTERUS  04/2015   HYSTEROSCOPY WITH D & C  07/22/2011   Procedure: DILATATION AND CURETTAGE /HYSTEROSCOPY;  Surgeon: Cyril Mourning, MD;  Location: [facility];  Service: Gynecology;;   HYSTEROSCOPY WITH D & C N/A 01/15/2018   Procedure: DILATATION AND CURETTAGE [place] WITH MYOSURE;  Surgeon: Arvella Nigh, MD;  Location: [facility];  Service: Gynecology;  Laterality: N/A;   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  Suggestion of high cardiac output (possibly related to anemia thyrotoxicosis, Pregnancy, sepsis etc.) -- was in setting of symptomatic anemia - Hgb 5.7    There were no vitals filed for this visit.   Subjective Assessment - 07/09/22 0843     Currently in Pain? No/denies    Pain Score 0-No pain                  Group Session:  S: Doing better today  O: The primary objective of this group therapy session is to equip participants with practical strategies and coping mechanisms to effectively manage accumulated tasks that seem insurmountable due to mental health challenges such as depression and anxiety. The group aims to build a supportive environment wherein individuals can openly discuss their struggles, share experiences, and learn from one another.  The session's strategies will encompass goal setting, time management, task breakdown, creating conducive environments, and mindfulness practices. We aim to help participants perceive tasks as manageable units rather than overwhelming piles and encourage an approach of progress over perfection.   A: Patient demonstrated active engagement throughout the session. They contributed valuable insights during discussions, asked relevant questions, and showed enthusiasm towards learning new strategies. Their interaction with other group members was respectful and empathetic, contributing to a supportive group dynamic. Patient appeared to resonate with the strategies of "breaking it down" and "creating a conducive environment," and they shared plans to incorporate these strategies into their daily routine. Their active participation, willingness to share personal experiences, and acceptance of new strategies demonstrate a strong benefit from this therapy session.    P: Continue to attend PHP OT group sessions 5x week for 4 weeks to promote daily structure, social engagement, and opportunities to develop and utilize adaptive strategies to maximize functional performance in preparation for safe transition and integration back into school, work, and the community. Plan to address topic of tbd in next OT group session.                 OT Education - 07/09/22 0843     Education Details Task Mgmt 2              OT Short Term Goals - 06/25/22 1025       OT SHORT TERM GOAL #1   Title Patient will be educated on strategies to improve psychosocial skills needed to participate fully in all daily, work, and leisure activities.    Time 4    Period Weeks    Status On-going    Target Date 07/25/22      OT SHORT TERM GOAL #2   Title Pt will apply psychosocial skills and coping mechanisms to daily activities in order to function independently and reintegrate into community dwelling      OT [place] #3   Title Pt will recall and/or apply 1-3 sleep hygiene strategies to improve BADL routine upon reintegrating into community      OT [place] #4   Title Pt will choose and/or engage in 1-3 socially engaging leisure activities to improve social participation skills upon reintegrating into community      OT [place] #5   Title Pt will engage in goal setting to improve functional BADL/IADL routine upon reintegrating into community                      Plan - 07/09/22 0843     Psychosocial Skills Coping Strategies;Habits;Interpersonal Interaction;Routines and  Behaviors             Patient will benefit from skilled therapeutic intervention in order to improve the following deficits and impairments:       Psychosocial Skills: Coping Strategies, Habits, Interpersonal Interaction, Routines and Behaviors   Visit Diagnosis: Difficulty coping    Problem List Patient Active Problem List   Diagnosis Date Noted   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   Acute on chronic diastolic congestive heart failure ([place]) 21/97/5883   Acute diastolic heart failure ([place])    Pulmonary edema 08/07/2019   Sinus tachycardia 05/17/2019   Atypical chest pain 02/11/2019   Pulmonary hypertension, unspecified ([place]) 02/11/2019   Functional systolic murmur 25/49/8264   Inadequate sleep hygiene 12/08/2018   Psychophysiological insomnia 12/08/2018   Obesity hypoventilation syndrome ([place]) 12/08/2018   OSA (obstructive sleep apnea) 12/08/2018   Chronic migraine without aura without status migrainosus, not intractable 09/21/2018   Bipolar 1 disorder, depressed, severe ([place]) 09/08/2018   Endometrium, polyp 01/15/2018    Class: Present on Admission   Fibroids, submucosal 01/15/2018    Class: Present on Admission   Iron deficiency anemia due to chronic blood loss 09/16/2017   Menorrhagia with regular cycle    Symptomatic anemia 08/28/2017    Acute blood loss anemia 08/28/2017   Bipolar disorder ([place]) 08/28/2017   Anxiety 08/28/2017   Depression with anxiety 08/28/2017   Fibroids 08/28/2017   PCOS (polycystic ovarian syndrome) 08/28/2017   Fever 08/28/2017   HTN (hypertension) 08/28/2017   Morbid obesity ([place]) 12/04/2015   Pregnancy with history of uterine myomectomy 04/07/2015   History of right oophorectomy 02/21/2015   History of recurrent miscarriages 02/01/2015   Vitamin D deficiency 02/01/2015    Brantley Stage, OT 07/09/2022, 8:43 AM [place], [place], [place], [address] Phone: [phone]   Fax:  [phone]  Name: Christine [name] MRN: [id number] Date of Birth: July 30, 1976

## 2022-07-10 ENCOUNTER — Other Ambulatory Visit (HOSPITAL_COMMUNITY): Payer: Medicare Other | Admitting: Licensed Clinical Social Worker

## 2022-07-10 ENCOUNTER — Other Ambulatory Visit (HOSPITAL_COMMUNITY): Payer: Medicare Other

## 2022-07-10 ENCOUNTER — Encounter (HOSPITAL_COMMUNITY): Payer: Self-pay

## 2022-07-10 DIAGNOSIS — F3162 Bipolar disorder, current episode mixed, moderate: Secondary | ICD-10-CM | POA: Diagnosis not present

## 2022-07-10 DIAGNOSIS — R4589 Other symptoms and signs involving emotional state: Secondary | ICD-10-CM

## 2022-07-10 DIAGNOSIS — F314 Bipolar disorder, current episode depressed, severe, without psychotic features: Secondary | ICD-10-CM | POA: Diagnosis not present

## 2022-07-10 NOTE — Therapy (Signed)
[facility] [place], [place], [address] Phone: [phone]   Fax:  [phone]  Occupational Therapy Treatment Virtual Visit via Video Note  I connected with Christine [name] on 07/10/22 at  8:00 AM EST by a video enabled telemedicine application and verified that I am speaking with the correct person using two identifiers.  Location: Patient: home Provider: office   I discussed the limitations of evaluation and management by telemedicine and the availability of in person appointments. The patient expressed understanding and agreed to proceed.    The patient was advised to call back or seek an in-person evaluation if the symptoms worsen or if the condition fails to improve as anticipated.  I provided 55 minutes of non-face-to-face time during this encounter.   Patient Details  Name: Christine [name] MRN: [id number] Date of Birth: 04/17/1976 No data recorded  Encounter Date: 07/10/2022   OT End of Session - 07/10/22 1722     Visit Number 9    Number of Visits 20    Date for OT Re-Evaluation 07/25/22    OT Start Time 1200    OT Stop Time 1255    OT Time Calculation (min) 55 min             Past Medical History:  Diagnosis Date   Anemia    Presumably from menorrhagia/menomenorrhagia; has had both blood and iron transfusions   Anxiety    Back pain    Bipolar 1 disorder ([place])    With depression   Chest pain    Depression    Diastolic heart failure ([place])    Essential hypertension    Fibromyalgia    Headache    History of blood transfusion 08/2017   [facility]   History of hiatal hernia    IBS (irritable bowel syndrome)    Infertility, female    Infertility, female    Lactose intolerance    Lower extremity edema    Migraines    Morbid obesity ([facility])    Osteoarthritis    Hands, ankles; HLA-B27 positive   PCOS (polycystic ovarian syndrome)    Prediabetes    Seasonal allergies    With recurrent  allergic rhinitis   Sleep apnea    SOB (shortness of breath)    Uterine leiomyoma    Vitamin D deficiency     Past Surgical History:  Procedure Laterality Date   CARDIAC CT ANGIOGRAM  03/2019    Ca Score 0. Normal Coronary origin R dominant. No evidence of CAD. ? Liver nodules - poorly visualized (consider MRI w & w/o Gad contrast)   CERVICAL POLYPECTOMY  01/15/2018   Procedure: CERVICAL POLYPECTOMY;  Surgeon: Arvella Nigh, MD;  Location: [facility];  Service: Gynecology;;   COLONOSCOPY  2008   Normal   DIAGNOSTIC LAPAROSCOPY  08/1999   dermoid cyst, RSO   DILATION AND CURETTAGE OF UTERUS  05/2004   MAB   DILATION AND CURETTAGE OF UTERUS  04/2015   HYSTEROSCOPY WITH D & C  07/22/2011   Procedure: DILATATION AND CURETTAGE /HYSTEROSCOPY;  Surgeon: Cyril Mourning, MD;  Location: [facility];  Service: Gynecology;;   HYSTEROSCOPY WITH D & C N/A 01/15/2018   Procedure: DILATATION AND CURETTAGE [place] WITH MYOSURE;  Surgeon: Arvella Nigh, MD;  Location: [facility];  Service: Gynecology;  Laterality: N/A;   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  Suggestion of high cardiac output (possibly related to anemia thyrotoxicosis, Pregnancy, sepsis etc.) -- was in setting of symptomatic anemia - Hgb 5.7    There were no vitals filed for this visit.   Subjective Assessment - 07/10/22 1721     Currently in Pain? No/denies    Pain Score 0-No pain                 Group Session:  S: Doing a little better today  O: The objective of this presentation is to provide a comprehensive understanding of the concept of "motivation" and its role in human behavior and well-being. The content covers various theories of motivation, including intrinsic and extrinsic motivators, and explores the psychological mechanisms that drive individuals to achieve goals, overcome obstacles, and make decisions. By diving into  real-world applications, the presentation aims to offer actionable strategies for enhancing motivation in different life domains, such as work, relationships, and personal growth. Utilizing a multi-disciplinary approach, this presentation integrates insights from psychology, neuroscience, and behavioral economics to present a holistic view of motivation. The objective is not only to educate the audience about the complexities and driving forces behind motivation but also to equip them with practical tools and techniques to improve their own motivation levels. By the end of the presentation, attendees should have a well-rounded understanding of what motivates human actions and how to harness this knowledge for personal and professional betterment.   A: The patient demonstrates a high level of engagement during the session, actively participating in discussions about motivation theories and their applicability to their own life. They show keen interest in learning new strategies to improve their motivation and even offer examples from their own experiences that align with the theories presented. Their level of self-awareness and willingness to invest in self-improvement suggest that they are well-positioned to benefit from the practical tools and techniques discussed. The patient's ability to articulate their goals and challenges further supports the likelihood of successfully implementing the strategies presented.     P: Continue to attend PHP OT group sessions 5x week for 4 weeks to promote daily structure, social engagement, and opportunities to develop and utilize adaptive strategies to maximize functional performance in preparation for safe transition and integration back into school, work, and the community. Plan to address topic of tbd in next OT group session.                  OT Education - 07/10/22 1721     Education Details Motivation 2              OT Short Term Goals -  06/25/22 1025       OT SHORT TERM GOAL #1   Title Patient will be educated on strategies to improve psychosocial skills needed to participate fully in all daily, work, and leisure activities.    Time 4    Period Weeks    Status On-going    Target Date 07/25/22      OT SHORT TERM GOAL #2   Title Pt will apply psychosocial skills and coping mechanisms to daily activities in order to function independently and reintegrate into community dwelling      OT [place] #3   Title Pt will recall and/or apply 1-3 sleep hygiene strategies to improve BADL routine upon reintegrating into community      OT SHORT TERM GOAL #4   Title Pt will choose and/or engage in 1-3 socially engaging leisure activities to  improve social participation skills upon reintegrating into community      OT SHORT TERM GOAL #5   Title Pt will engage in goal setting to improve functional BADL/IADL routine upon reintegrating into community                      Plan - 07/10/22 1722     Psychosocial Skills Coping Strategies;Habits;Interpersonal Interaction;Routines and Behaviors             Patient will benefit from skilled therapeutic intervention in order to improve the following deficits and impairments:       Psychosocial Skills: Coping Strategies, Habits, Interpersonal Interaction, Routines and Behaviors   Visit Diagnosis: Difficulty coping    Problem List Patient Active Problem List   Diagnosis Date Noted   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   Acute on chronic diastolic congestive heart failure ([place]) 15/40/0867   Acute diastolic heart failure ([place])    Pulmonary edema 08/07/2019   Sinus tachycardia 05/17/2019   Atypical chest pain 02/11/2019   Pulmonary hypertension, unspecified ([place]) 02/11/2019   Functional systolic murmur 61/95/0932   Inadequate sleep hygiene 12/08/2018   Psychophysiological insomnia 12/08/2018   Obesity hypoventilation syndrome  ([place]) 12/08/2018   OSA (obstructive sleep apnea) 12/08/2018   Chronic migraine without aura without status migrainosus, not intractable 09/21/2018   Bipolar 1 disorder, depressed, severe ([place]) 09/08/2018   Endometrium, polyp 01/15/2018    Class: Present on Admission   Fibroids, submucosal 01/15/2018    Class: Present on Admission   Iron deficiency anemia due to chronic blood loss 09/16/2017   Menorrhagia with regular cycle    Symptomatic anemia 08/28/2017   Acute blood loss anemia 08/28/2017   Bipolar disorder ([place]) 08/28/2017   Anxiety 08/28/2017   Depression with anxiety 08/28/2017   Fibroids 08/28/2017   PCOS (polycystic ovarian syndrome) 08/28/2017   Fever 08/28/2017   HTN (hypertension) 08/28/2017   Morbid obesity ([place]) 12/04/2015   Pregnancy with history of uterine myomectomy 04/07/2015   History of right oophorectomy 02/21/2015   History of recurrent miscarriages 02/01/2015   Vitamin D deficiency 02/01/2015    Brantley Stage, OT 07/10/2022, 5:22 PM  [facility], [place], [place], [address] Phone: [phone]   Fax:  [phone]  Name: Christine [name] MRN: [id number] Date of Birth: 1975-09-07

## 2022-07-10 NOTE — Psych (Signed)
Virtual Visit via Video Note  I connected with [name] on 07/10/22 at  9:00 AM EST by a video enabled telemedicine application and verified that I am speaking with the correct person using two identifiers.  Location: Patient: pt's home Provider: clinical home office   I discussed the limitations of evaluation and management by telemedicine and the availability of in person appointments. The patient expressed understanding and agreed to proceed.   I discussed the assessment and treatment plan with the patient. The patient was provided an opportunity to ask questions and all were answered. The patient agreed with the plan and demonstrated an understanding of the instructions.   The patient was advised to call back or seek an in-person evaluation if the symptoms worsen or if the condition fails to improve as anticipated.  I provided 240 minutes of non-face-to-face time during this encounter.   Jarome Matin   [facility] PHP THERAPIST PROGRESS NOTE  Richele Strand [phone]  Session Time: 9:00 am - 10:00 am  Participation Level: Active  Behavioral Response: CasualAlertAnxious and Depressed  Type of Therapy: Group Therapy  Treatment Goals addressed: Coping  Progress Towards Goals: Progressing  Interventions: CBT, DBT, Solution Focused, Strength-based, Supportive, and Reframing  Therapist Response: Clinician led check-in regarding current stressors and situation, and review of patient completed daily inventory. Clinician utilized active listening and empathetic response and validated patient emotions. Clinician facilitated processing group on pertinent issues.?   Summary: Patient arrived within time allowed. Patient rates her mood at a 4 on a scale of 1-10 with 10 being best. Pt reports "I've been dealing with a lot of mood swings." She reports she slept okay, about five hours. She states her appetite was fine. She denies SI/SH thoughts. She reports she went to a new  therapist yesterday and it went well, although it was difficult to complete the intake and go through her whole history. Pt states she does not have a particular topic she would like to discuss during processing. Pt able to process.?Pt engaged in discussion.?      Session Time: 10:00 am - 11:00 am  Participation Level: Active  Behavioral Response: CasualAlertAnxious and Depressed  Type of Therapy: Group Therapy  Treatment Goals addressed: Coping  Progress Towards Goals: Progressing  Interventions: CBT, DBT, Solution Focused, Strength-based, Supportive, and Reframing  Therapist Response: Clinician led group on hopelessness and invited group members to share their frustrations with feeling hopeless. Clinician then asked group members to identify what currently gives them hope or what they believe could give them hope. Clinician utilized positive psychology principles to inform discussion. Clinician also confirmed details of specifics regarding [employer] testing with nurse manager and shared with patients, per their request.   Summary: Pt engaged in discussion. Pt reports that having the right medication regimen would give her hope, as she is constantly battling mood swings. She is encouraging and supportive toward her peers.    Session Time: 11:00 am - 12:00 pm  Participation Level: Active  Behavioral Response: CasualAlertAnxious and Depressed  Type of Therapy: Group Therapy  Treatment Goals addressed: Coping  Progress Towards Goals: Progressing  Interventions: CBT, DBT, Solution Focused, Strength-based, Supportive, and Reframing  Therapist Response: Clinician led discussion on anxiety. Clinician began with psychoeducation video explaining the brain's fear response and then discussion was held around the benefits of exposure therapy techniques to reduce anxiety responses. Clinician utilized CBT principles to inform discussion.  Summary: Pt was present and engaged for the  discussion.   Session  Time: 12:00 pm - 1:00 pm  Participation Level: Active  Behavioral Response: CasualAlertAnxious and Depressed  Type of Therapy: Group Therapy  Treatment Goals addressed: Coping  Progress Towards Goals: Progressing  Interventions: CBT, DBT, Solution Focused, Strength-based, Supportive, and Reframing  Therapist Response: 12:00 - 12:50 pm: See OT note. 12:50 - 1:00 pm: Clinician led check-out. Clinician assessed for immediate needs, medication compliance and efficacy, and safety concerns?  Summary: 12:00 - 12:50 pm: See OT note 12:50 - 1:00 pm: At check-out, patient contracts for safety.?Patient demonstrates progress as evidenced by her continued engagement and by being receptive to treatment. Patient denies SI/HI/self-harm thoughts at the end of group and agrees to seek help should those thoughts/feelings occur.?   Suicidal/Homicidal: Nowithout intent/plan  Plan: ?Pt will continue in PHP and medication management while continuing to work on decreasing depression symptoms,?SI, and anxiety symptoms,?and increasing the ability to self manage symptoms.  Collaboration of Care: Medication Management AEB Dr. Kai Levins  Patient/Guardian was advised Release of Information must be obtained prior to any record release in order to collaborate their care with an outside provider. Patient/Guardian was advised if they have not already done so to contact the registration department to sign all necessary forms in order for [place] to release information regarding their care.   Consent: Patient/Guardian gives verbal consent for treatment and assignment of benefits for services provided during this visit. Patient/Guardian expressed understanding and agreed to proceed.   Diagnosis: Bipolar 1 disorder, depressed, severe ([place]) [F31.4]    1. Bipolar 1 disorder, depressed, severe ([place])       Heron Nay, Latanya Presser 07/10/2022

## 2022-07-10 NOTE — Progress Notes (Signed)
PHP program requires occupational therapy

## 2022-07-11 ENCOUNTER — Other Ambulatory Visit (HOSPITAL_COMMUNITY): Payer: Medicare Other

## 2022-07-11 ENCOUNTER — Encounter (HOSPITAL_COMMUNITY): Payer: Self-pay | Admitting: Licensed Clinical Social Worker

## 2022-07-11 ENCOUNTER — Other Ambulatory Visit (HOSPITAL_COMMUNITY): Payer: Medicare Other | Admitting: Licensed Clinical Social Worker

## 2022-07-11 DIAGNOSIS — F411 Generalized anxiety disorder: Secondary | ICD-10-CM

## 2022-07-11 DIAGNOSIS — F3162 Bipolar disorder, current episode mixed, moderate: Secondary | ICD-10-CM

## 2022-07-11 DIAGNOSIS — F314 Bipolar disorder, current episode depressed, severe, without psychotic features: Secondary | ICD-10-CM | POA: Diagnosis not present

## 2022-07-11 DIAGNOSIS — R4589 Other symptoms and signs involving emotional state: Secondary | ICD-10-CM

## 2022-07-11 NOTE — Progress Notes (Signed)
BH MD/PA/NP PHP Progress Note  Virtual Visit via Video Note  I connected with Christine [name] on 07/11/22 at  9:00 AM EST by a video enabled telemedicine application and verified that I am speaking with the correct person using two identifiers.  Location: Patient: Home Provider: [facility]   I discussed the limitations of evaluation and management by telemedicine and the availability of in person appointments. The patient expressed understanding and agreed to proceed.   07/11/2022 9:16 AM Christine [name]  MRN:  [id number]  Chief Complaint:  Chief Complaint  Patient presents with   Follow-up   HPI:  Christine [name] is a 46 yr old female who presents via Virtual Video for follow up and for medication management, she enrolled in the [facility] Program 06/24/2022.  PPHx is significant for Bipolar Disorder and Anxiety, a history of Self Injurious Behavior (cutting- last 2020), and 4 Psychiatric Hospitalizations (last [facility] 09/2018), and no history of Suicide Attempts.   She reports that she is doing better today.  She reports that things have been getting better.  She reports that her racing thoughts and fast talking resolved by Monday night.  She reports that on Tuesday she began to be in a depressed mood but that it was still better that when she started the program.  She reports that this too is starting to improve some but has remained stable.  She reports no thoughts of SI for several days.  Discussed with her that given her improvement we would not make any medication changes at this time and she was in agreement with this.  She reports no HI or AVH.  She reports her sleep is good.  She reports her appetite is good.  She reports no other concerns at present.    Visit Diagnosis:    ICD-10-CM   1. Bipolar 1 disorder, mixed, moderate (HCC)  F31.62     2. Generalized anxiety disorder  F41.1       Past Psychiatric History: Bipolar Disorder and Anxiety, a history of Self Injurious Behavior  (cutting- last 2020), and 4 Psychiatric Hospitalizations (last [facility] 09/2018), and no history of Suicide Attempts.     Past Medical History:  Past Medical History:  Diagnosis Date   Anemia    Presumably from menorrhagia/menomenorrhagia; has had both blood and iron transfusions   Anxiety    Back pain    Bipolar 1 disorder (HCC)    With depression   Chest pain    Depression    Diastolic heart failure ([place])    Essential hypertension    Fibromyalgia    Headache    History of blood transfusion 08/2017   [place]   History of hiatal hernia    IBS (irritable bowel syndrome)    Infertility, female    Infertility, female    Lactose intolerance    Lower extremity edema    Migraines    Morbid obesity (HCC)    Osteoarthritis    Hands, ankles; HLA-B27 positive   PCOS (polycystic ovarian syndrome)    Prediabetes    Seasonal allergies    With recurrent allergic rhinitis   Sleep apnea    SOB (shortness of breath)    Uterine leiomyoma    Vitamin D deficiency     Past Surgical History:  Procedure Laterality Date   CARDIAC CT ANGIOGRAM  03/2019    Ca Score 0. Normal Coronary origin R dominant. No evidence of CAD. ? Liver nodules - poorly visualized (consider MRI w & w/o Gad  contrast)   CERVICAL POLYPECTOMY  01/15/2018   Procedure: CERVICAL POLYPECTOMY;  Surgeon: Arvella Nigh, MD;  Location: [facility];  Service: Gynecology;;   COLONOSCOPY  2008   Normal   DIAGNOSTIC LAPAROSCOPY  08/1999   dermoid cyst, [facility]   DILATION AND CURETTAGE OF UTERUS  05/2004   MAB   DILATION AND CURETTAGE OF UTERUS  04/2015   HYSTEROSCOPY WITH D & C  07/22/2011   Procedure: DILATATION AND CURETTAGE /HYSTEROSCOPY;  Surgeon: Cyril Mourning, MD;  Location: [facility];  Service: Gynecology;;   HYSTEROSCOPY WITH D & C N/A 01/15/2018   Procedure: DILATATION AND CURETTAGE [place] WITH MYOSURE;  Surgeon: Arvella Nigh, MD;  Location: [facility];  Service: Gynecology;  Laterality: N/A;   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  Suggestion of high cardiac output (possibly related to anemia thyrotoxicosis, Pregnancy, sepsis etc.) -- was in setting of symptomatic anemia - Hgb 5.7    Family Psychiatric History: Mother- Depression, Anxiety No Known history of Substance Abuse or Suicides.  Family History:  Family History  Problem Relation Age of Onset   Anxiety disorder Mother    Depression Mother    Cancer Mother        pancreatic    High blood pressure Mother    Hypertension Mother    Hyperlipidemia Mother    Cancer Father        colon   High blood pressure Father    Diabetes Father    Hypertension Father    Hyperlipidemia Father    Heart disease Father    Stroke Father    Kidney disease Father    Sleep apnea Father    Obesity Father    Cancer Brother 6       leukemia, childhood   High blood pressure Brother    Migraines Neg Hx     Social History:  Social History   Socioeconomic History   Marital status: Married    Spouse name: [name]   Number of children: 2   Years of education: 2 years of grad school + bach & HS   Highest education level: Bachelor's degree (e.g., BA, AB, BS)  Occupational History   Occupation: disabled   Occupation: Part time Naval architect  Tobacco Use   Smoking status: Never   Smokeless tobacco: Never  Vaping Use   Vaping Use: Never used  Substance and Sexual Activity   Alcohol use: Yes    Comment: occasional, maybe once a month or so   Drug use: No   Sexual activity: Yes    Partners: Male    Birth control/protection: None  Other Topics Concern   Not on file  Social History Narrative   ** Merged History Encounter **       Lives at home with her husband and 2 adopted daughters. Right handed Caffeine: decreased intake, drinks rarely  Is on long-term disability.   Social Determinants of Health   Financial Resource Strain: Medium Risk (09/10/2017)   Overall  Financial Resource Strain (CARDIA)    Difficulty of Paying Living Expenses: Somewhat hard  Food Insecurity: Food Insecurity Present (09/10/2017)   Hunger Vital Sign    Worried About Running Out of Food in the Last Year: Sometimes true    Ran Out of Food in the Last Year: Sometimes true  Transportation Needs: No Transportation Needs (09/10/2017)   PRAPARE -  Hydrologist (Medical): No    Lack of Transportation (Non-Medical): No  Physical Activity: Unknown (09/10/2017)   Exercise Vital Sign    Days of Exercise per Week: 0 days    Minutes of Exercise per Session: Not on file  Stress: Stress Concern Present (09/10/2017)   [place]    Feeling of Stress : Rather much  Social Connections: Unknown (09/10/2017)   Social Connection and Isolation Panel [NHANES]    Frequency of Communication with Friends and Family: Never    Frequency of Social Gatherings with Friends and Family: Never    Attends Religious Services: Never    Marine scientist or Organizations: No    Attends Archivist Meetings: Never    Marital Status: Not on file    Allergies:  Allergies  Allergen Reactions   Penicillins Rash and Other (See Comments)    Has patient had a PCN reaction causing immediate rash, facial/tongue/throat swelling, SOB or lightheadedness with hypotension: Yes Has patient had a PCN reaction causing severe rash involving mucus membranes or skin necrosis: Unknown Has patient had a PCN reaction that required hospitalization: No Has patient had a PCN reaction occurring within the last 10 years: No If all of the above answers are "NO", then may proceed with Cephalosporin use.     Metabolic Disorder Labs: Lab Results  Component Value Date   HGBA1C 5.7 05/02/2022   No results found for: "PROLACTIN" Lab Results  Component Value Date   CHOL 178 05/09/2022   TRIG 81 05/09/2022   HDL 38 (L) 05/09/2022    LDLCALC 125 (H) 05/09/2022   LDLCALC 131 (H) 10/07/2018   Lab Results  Component Value Date   TSH 1.55 05/02/2022   TSH 1.215 08/08/2019    Therapeutic Level Labs: Lab Results  Component Value Date   LITHIUM 0.12 (L) 08/07/2019   LITHIUM 0.4 (L) 10/02/2018   No results found for: "VALPROATE" No results found for: "CBMZ"  Current Medications: Current Outpatient Medications  Medication Sig Dispense Refill   acetaminophen (TYLENOL) 325 MG tablet Take 650 mg by mouth every 6 (six) hours as needed for mild pain or headache.     amLODipine (NORVASC) 5 MG tablet Take 1 tablet (5 mg total) by mouth daily. 30 tablet 0   CAPLYTA 42 MG capsule Take 42 mg by mouth daily.     chlorthalidone (HYGROTON) 25 MG tablet TAKE 1/2 TABLET(12.5 MG) BY MOUTH DAILY 45 tablet 0   clonazePAM (KLONOPIN) 0.5 MG tablet Take 1 tablet (0.5 mg total) by mouth daily as needed for anxiety. 30 tablet 0   fluticasone (FLONASE) 50 MCG/ACT nasal spray 1 spray in each nostril     gabapentin (NEURONTIN) 100 MG capsule Take 100 mg by mouth 3 (three) times daily.     lamoTRIgine (LAMICTAL) 150 MG tablet Take 1 tablet (150 mg total) by mouth 2 (two) times daily. For mood 60 tablet 2   lithium carbonate (LITHOBID) 300 MG CR tablet Take 2 tablets (600 mg total) by mouth 2 (two) times daily. Take 2 tablets (600 mg) every morning and 1 tablet (300 mg) at night (Patient taking differently: Take 600 mg by mouth 2 (two) times daily. Take 2 tablets (600 mg) every morning and 1 tablet (600 mg) at night) 90 tablet 0   metFORMIN (GLUCOPHAGE) 500 MG tablet Take 1 tablet (500 mg total) by mouth 2 (two) times daily with a meal.  60 tablet 0   metoprolol succinate (TOPROL-XL) 100 MG 24 hr tablet TAKE 1 TABLET BY MOUTH DAILY WITH OR IMMEDIATELY FOLLOWING A MEAL 90 tablet 1   Vitamin D, Ergocalciferol, (DRISDOL) 1.25 MG (50000 UNIT) CAPS capsule Take 1 capsule (50,000 Units total) by mouth every 7 (seven) days. 5 capsule    No current  facility-administered medications for this visit.     Musculoskeletal: Strength & Muscle Tone: within normal limits Gait & Station:  sitting during interview Patient leans: N/A  Psychiatric Specialty Exam: Review of Systems  Respiratory:  Negative for shortness of breath.   Cardiovascular:  Negative for chest pain.  Gastrointestinal:  Negative for abdominal pain, constipation, diarrhea, nausea and vomiting.  Neurological:  Negative for dizziness, weakness and headaches.  Psychiatric/Behavioral:  Negative for dysphoric mood, hallucinations, sleep disturbance and suicidal ideas. The patient is not nervous/anxious.     There were no vitals taken for this visit.There is no height or weight on file to calculate BMI.  General Appearance: Casual and Fairly Groomed  Eye Contact:  Good  Speech:  Clear and Coherent and Normal Rate  Volume:  Normal  Mood:   "better"  Affect:  Appropriate and Congruent  Thought Process:  Coherent and Goal Directed  Orientation:  Full (Time, Place, and Person)  Thought Content: WDL and Logical   Suicidal Thoughts:  No  Homicidal Thoughts:  No  Memory:  Immediate;   Good Recent;   Good  Judgement:  Good  Insight:  Good  Psychomotor Activity:  Normal  Concentration:  Concentration: Good and Attention Span: Good  Recall:  Good  Fund of Knowledge: Good  Language: Good  Akathisia:  Negative  Handed:  Right  AIMS (if indicated): not done  Assets:  Communication Skills Desire for Improvement Financial Resources/Insurance Housing Resilience Social Support  ADL's:  Intact  Cognition: WNL  Sleep:  Good   Screenings: AIMS    Flowsheet Row Admission (Discharged) from 09/08/2018 in [facility]  AIMS Total Score 0      AUDIT    Flowsheet Row Admission (Discharged) from 09/08/2018 in [facility]  Alcohol Use Disorder Identification Test Final Score (AUDIT) 1      GAD-7     Flowsheet Row Counselor from 10/05/2018 in [facility] Counselor from 10/02/2018 in [place] Counselor from 09/09/2017 in [place] Counselor from 04/04/2017 in [place] Counselor from 03/18/2017 in [facility]  Total GAD-7 Score _0 PHQ2-9    Flowsheet Row Counselor from 07/01/2022 in [place] Counselor from 06/19/2022 in [facility] Office Visit from 05/09/2022 in [place] Video Visit from 06/12/2021 in [facility] Counselor from 11/30/2020 in [place]  PHQ-2 Total Score _1 PHQ-9 Total Score _2 --      Flowsheet Row Counselor from 07/01/2022 in [place] Counselor from 06/19/2022 in [facility] from 05/02/2022 in [facility] DEPT  C-SSRS RISK CATEGORY Error: Question 6 not populated Low Risk No Risk        Assessment and Plan:  Kenda "[name]" Runkles is a 46 yr old female who presents via Virtual Video for follow up and for medication  management, she enrolled in the [facility] 06/24/2022.  PPHx is significant for Bipolar Disorder and Anxiety, a history of Self Injurious Behavior (cutting- last 2020), and 4 Psychiatric Hospitalizations (last [facility] 09/2018), and no history of Suicide Attempts.    Christine [name] has continued to stabilize and improve.  Her racing thoughts and rapid talking has resolved.  We will not make any changes to medications at this time.  We will continue to monitor.    Bipolar Disorder, current episode Depressed: -Continue Caplyta 42 mg daily for mood stability. No refills  sent at this time. -Continue Lamictal 150 mg BID for mood stability.  No refills sent at this time. -Continue Lithium 600 mg BID for depression and mood stability.  No refills sent at this time. -Continue Klonopin 0.5 mg daily PRN for severe anxiety.  No refills sent at this time. -Continue to engage in [place] of Care: Collaboration of Care: Other PHP Program  Patient/Guardian was advised Release of Information must be obtained prior to any record release in order to collaborate their care with an outside provider. Patient/Guardian was advised if they have not already done so to contact the registration department to sign all necessary forms in order for [place] to release information regarding their care.   Consent: Patient/Guardian gives verbal consent for treatment and assignment of benefits for services provided during this visit. Patient/Guardian expressed understanding and agreed to proceed.    [name], MD 07/11/2022, 9:16 AM    Follow Up Instructions:    I discussed the assessment and treatment plan with the patient. The patient was provided an opportunity to ask questions and all were answered. The patient agreed with the plan and demonstrated an understanding of the instructions.   The patient was advised to call back or seek an in-person evaluation if the symptoms worsen or if the condition fails to improve as anticipated.  I provided 15 minutes of non-face-to-face time during this encounter.   [name], MD

## 2022-07-11 NOTE — Psych (Signed)
Virtual Visit via Video Note  I connected with Christine [name] on 07/11/22 at  9:00 AM EST by a video enabled telemedicine application and verified that I am speaking with the correct person using two identifiers.  Location: Patient: pt's home Provider: clinical home office   I discussed the limitations of evaluation and management by telemedicine and the availability of in person appointments. The patient expressed understanding and agreed to proceed.  I discussed the assessment and treatment plan with the patient. The patient was provided an opportunity to ask questions and all were answered. The patient agreed with the plan and demonstrated an understanding of the instructions.   The patient was advised to call back or seek an in-person evaluation if the symptoms worsen or if the condition fails to improve as anticipated.  I provided 240 minutes of non-face-to-face time during this encounter.   Jarome Matin   [facility] PHP THERAPIST PROGRESS NOTE  Yanelle Sousa [id number]   Session Time: 9:00 am - 10:00 am  Participation Level: Active  Behavioral Response: CasualAlertAnxious and Depressed  Type of Therapy: Group Therapy  Treatment Goals addressed: Coping  Progress Towards Goals: Progressing  Interventions: CBT, DBT, Solution Focused, Strength-based, Supportive, and Reframing  Therapist Response: Clinician led check-in regarding current stressors and situation, and review of patient completed daily inventory. Clinician utilized active listening and empathetic response and validated patient emotions. Clinician facilitated processing group on pertinent issues.?   Summary: Patient arrived within time allowed. Patient rates her mood at a 4 on a scale of 1-10 with 10 being best. Pt reports, "Down. I'm feeling a lot of guilt with my job. Missing a lot of meetings." Also reports she feels guilty for enjoying knitting and not working. Reports she got about 6 hours of sleep  last night and ate two meals yesterday. She denies SI/SH thoughts. Pt states she does not have a particular topic she would like to discuss during processing. Pt able to process.?Pt engaged in discussion.?      Session Time: 10:00 am - 11:00 am  Participation Level: Active  Behavioral Response: CasualAlertAnxious and Depressed  Type of Therapy: Group Therapy  Treatment Goals addressed: Coping  Progress Towards Goals: Progressing  Interventions: CBT, DBT, Solution Focused, Strength-based, Supportive, and Reframing  Therapist Response: Group viewed Ted Talk entitled "How To Not Take Things Personally" presented by Spero Curb. Cln utilized CBT principles to inform discussion while pts were encouraged to share their response to the video. Cln asked each patient to share a time in which they took things personally and reframed the situation based on what was shared in the [place].  Summary: Pt engaged in discussion. Pt shares a time in which she shared her current struggles with her friends on a private facebook group and one of them didn't respond for several days. She demonstrates good insight into the subject matter.    Session Time: 11:00 am - 12:00 pm  Participation Level: Active  Behavioral Response: CasualAlertAnxious and Depressed  Type of Therapy: Group Therapy  Treatment Goals addressed: Coping  Progress Towards Goals: Progressing  Interventions: CBT, DBT, Solution Focused, Strength-based, Supportive, and Reframing  Therapist Response: See OT note.   Summary: See OT note.   Session Time: 12:00 pm - 1:00 pm  Participation Level: Active  Behavioral Response: CasualAlertAnxious and Depressed  Type of Therapy: Group Therapy, Spiritual Care  Treatment Goals addressed: Coping  Progress Towards Goals: Progressing  Interventions: Supportive, Education  Therapist Response: 12:00 - 12:50  pm: Alain Marion, Chaplain, led group on self-care.. 12:50 - 1:00 pm:  Clinician led check-out. Clinician assessed for immediate needs, medication compliance and efficacy, and safety concerns?  Summary: 12:00 - 12:50 pm: Pt engaged in discussion. 12:50 - 1:00 pm: At check-out, patient contracts for safety.?Patient demonstrates progress as evidenced by her continued engagement and by being receptive to treatment. Patient denies SI/HI/self-harm thoughts at the end of group and agrees to seek help should those thoughts/feelings occur.?   Suicidal/Homicidal: Nowithout intent/plan  Plan: ?Pt will continue in PHP and medication management while continuing to work on decreasing depression symptoms,?SI, and anxiety symptoms,?and increasing the ability to self manage symptoms.   Collaboration of Care: Medication Management AEB Dr. Kai Levins.  Patient/Guardian was advised Release of Information must be obtained prior to any record release in order to collaborate their care with an outside provider. Patient/Guardian was advised if they have not already done so to contact the registration department to sign all necessary forms in order for [place] to release information regarding their care.   Consent: Patient/Guardian gives verbal consent for treatment and assignment of benefits for services provided during this visit. Patient/Guardian expressed understanding and agreed to proceed.   Diagnosis: Bipolar 1 disorder, mixed, moderate (HCC) [F31.62]    1. Bipolar 1 disorder, mixed, moderate ([place])   2. Generalized anxiety disorder   3. Bipolar 1 disorder, depressed, severe ([place])       Heron Nay, Latanya Presser 07/11/2022

## 2022-07-11 NOTE — Psych (Signed)
Virtual Visit via Video Note  I connected with [name] on 06/24/22 at  9:00 AM EST by a video enabled telemedicine application and verified that I am speaking with the correct person using two identifiers.  Location: Patient: patient home Provider: clinical home office   I discussed the limitations of evaluation and management by telemedicine and the availability of in person appointments. The patient expressed understanding and agreed to proceed.  I discussed the assessment and treatment plan with the patient. The patient was provided an opportunity to ask questions and all were answered. The patient agreed with the plan and demonstrated an understanding of the instructions.   The patient was advised to call back or seek an in-person evaluation if the symptoms worsen or if the condition fails to improve as anticipated.  Pt was provided 240 minutes of non-face-to-face time during this encounter.   [name], LCSW   [facility] PHP THERAPIST PROGRESS NOTE  Tamani Durney [id number]  Session Time: 9:00 - 10:00  Participation Level: Active  Behavioral Response: CasualAlertDepressed  Type of Therapy: Group Therapy  Treatment Goals addressed: Coping  Progress Towards Goals: Initial  Interventions: CBT, DBT, Supportive, and Reframing  Summary: Alzada Brazee is a 46 y.o. female who presents with depression symptoms.  Clinician led check-in regarding current stressors and situation, and review of patient completed daily inventory. Clinician utilized active listening and empathetic response and validated patient emotions. Clinician facilitated processing group on pertinent issues.?   Therapist Response: Patient arrived within time allowed. Patient rates her mood at a 2 on a scale of 1-10 with 10 being best. Pt states she feels "irritable." Pt reports she didn't leave the house over the weekend and attempted to stay busy however became quickly irritable. Pt reports poor sleep  and appetite. Pt reports passive SI and denies plan or intent. Patient able to process. Patient engaged in discussion.       Session Time: 10:00 am - 11:00 am   Participation Level: Active   Behavioral Response: CasualAlertDepressed   Type of Therapy: Group Therapy   Treatment Goals addressed: Coping   Progress Towards Goals: Progressing   Interventions: CBT, DBT, Solution Focused, Strength-based, Supportive, and Reframing   Therapist Response: Cln led discussion on personal standards and they way in which it impacts the way we view ourselves and our abilities. Group members discussed judgment, struggles, and barriers they experience in terms of personal standards. Cln brought in topics of balance, grace, and kindness. Cln proposed the "best friend test" as a way to calibrate whether we are viewing our situation with kindness or harshness.    Therapist Response:  Pt engaged in discussion and reports willingness to utilize the best friend test.          Session Time: 11:00 -12:00   Participation Level: Active   Behavioral Response: CasualAlertDepressed   Type of Therapy: Group Therapy   Treatment Goals addressed: Coping   Progress Towards Goals: Progressing   Interventions: CBT, DBT, Solution Focused, Strength-based, Supportive, and Reframing   Summary: Cln continued topic of DBT distress tolerance skills and the ACCEPTS distraction skill. Group reviewed P-T-S skills and discussed how they can practice them in their every day life.    Therapist Response: Pt engaged in discussion and determines ways to practice each skill.          Session Time: 12:00 -1:00   Participation Level: Active   Behavioral Response: CasualAlertDepressed   Type of Therapy: Group therapy, Occupational Therapy  Treatment Goals addressed: Coping   Progress Towards Goals: Progressing   Interventions: Supportive; Psychoeducation   Summary: 12:00 - 12:50: Occupational Therapy group led  by cln E. Hollan. 12:50 - 1:00 Clinician assessed for immediate needs, medication compliance and efficacy, and safety concerns.   Therapist Response: 12:00 - 12:50: See OT note 12:50 - 1:00 pm: At check-out, patient reports no immediate concerns. Patient demonstrates progress as evidenced by participating in first group session. Patient denies SI/HI/self-harm thoughts at the end of group.  Suicidal/Homicidal: Nowithout intent/plan  Plan: Pt will continue in PHP while working to decrease depression symptoms, increase emotion regulation, and increase ability to manage symptoms in a healthy manner.   Collaboration of Care: Medication Management AEB A Pashayan  Patient/Guardian was advised Release of Information must be obtained prior to any record release in order to collaborate their care with an outside provider. Patient/Guardian was advised if they have not already done so to contact the registration department to sign all necessary forms in order for [place] to release information regarding their care.   Consent: Patient/Guardian gives verbal consent for treatment and assignment of benefits for services provided during this visit. Patient/Guardian expressed understanding and agreed to proceed.   Diagnosis: Bipolar 1 disorder, depressed, severe ([place]) [F31.4]    1. Bipolar 1 disorder, depressed, severe ([place])   2. Generalized anxiety disorder     [name], LCSW

## 2022-07-12 ENCOUNTER — Other Ambulatory Visit (HOSPITAL_COMMUNITY): Payer: Medicare Other | Admitting: Licensed Clinical Social Worker

## 2022-07-12 ENCOUNTER — Encounter (HOSPITAL_COMMUNITY): Payer: Self-pay

## 2022-07-12 ENCOUNTER — Other Ambulatory Visit (HOSPITAL_COMMUNITY): Payer: Medicare Other

## 2022-07-12 DIAGNOSIS — R4589 Other symptoms and signs involving emotional state: Secondary | ICD-10-CM

## 2022-07-12 DIAGNOSIS — F411 Generalized anxiety disorder: Secondary | ICD-10-CM

## 2022-07-12 DIAGNOSIS — F314 Bipolar disorder, current episode depressed, severe, without psychotic features: Secondary | ICD-10-CM | POA: Diagnosis not present

## 2022-07-12 DIAGNOSIS — F3162 Bipolar disorder, current episode mixed, moderate: Secondary | ICD-10-CM | POA: Diagnosis not present

## 2022-07-12 NOTE — Therapy (Signed)
[place], [place], [address] Phone: [phone]   Fax:  [phone]  Occupational Therapy Treatment Virtual Visit via Video Note  I connected with Christine [name] on 07/12/22 at  8:00 AM EST by a video enabled telemedicine application and verified that I am speaking with the correct person using two identifiers.  Location: Patient: home Provider: office   I discussed the limitations of evaluation and management by telemedicine and the availability of in person appointments. The patient expressed understanding and agreed to proceed.    The patient was advised to call back or seek an in-person evaluation if the symptoms worsen or if the condition fails to improve as anticipated.  I provided 55 minutes of non-face-to-face time during this encounter.   Patient Details  Name: Christine [name] MRN: [id number] Date of Birth: [birth year] No data recorded  Encounter Date: 07/11/2022   OT End of Session - 07/12/22 0937     Visit Number 10    Number of Visits 20    Date for OT Re-Evaluation 07/25/22    OT Start Time 1200    OT Stop Time 1255    OT Time Calculation (min) 55 min             Past Medical History:  Diagnosis Date   Anemia    Presumably from menorrhagia/menomenorrhagia; has had both blood and iron transfusions   Anxiety    Back pain    Bipolar 1 disorder ([place])    With depression   Chest pain    Depression    Diastolic heart failure ([place])    Essential hypertension    Fibromyalgia    Headache    History of blood transfusion 08/2017   [place]   History of hiatal hernia    IBS (irritable bowel syndrome)    Infertility, female    Infertility, female    Lactose intolerance    Lower extremity edema    Migraines    Morbid obesity (HCC)    Osteoarthritis    Hands, ankles; HLA-B27 positive   PCOS (polycystic ovarian syndrome)    Prediabetes    Seasonal allergies    With recurrent  allergic rhinitis   Sleep apnea    SOB (shortness of breath)    Uterine leiomyoma    Vitamin D deficiency     Past Surgical History:  Procedure Laterality Date   CARDIAC CT ANGIOGRAM  03/2019    Ca Score 0. Normal Coronary origin R dominant. No evidence of CAD. ? Liver nodules - poorly visualized (consider MRI w & w/o Gad contrast)   CERVICAL POLYPECTOMY  01/15/2018   Procedure: CERVICAL POLYPECTOMY;  Surgeon: Arvella Nigh, MD;  Location: [facility];  Service: Gynecology;;   COLONOSCOPY  2008   Normal   DIAGNOSTIC LAPAROSCOPY  08/1999   dermoid cyst, RSO   DILATION AND CURETTAGE OF UTERUS  05/2004   MAB   DILATION AND CURETTAGE OF UTERUS  04/2015   HYSTEROSCOPY WITH D & C  07/22/2011   Procedure: DILATATION AND CURETTAGE /HYSTEROSCOPY;  Surgeon: Cyril Mourning, MD;  Location: [facility];  Service: Gynecology;;   HYSTEROSCOPY WITH D & C N/A 01/15/2018   Procedure: DILATATION AND CURETTAGE [place] WITH MYOSURE;  Surgeon: Arvella Nigh, MD;  Location: [facility];  Service: Gynecology;  Laterality: N/A;   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  Suggestion of high cardiac output (possibly related to anemia thyrotoxicosis, Pregnancy, sepsis etc.) -- was in setting of symptomatic anemia - Hgb 5.7    There were no vitals filed for this visit.   Subjective Assessment - 07/12/22 0937     Currently in Pain? No/denies    Pain Score 0-No pain                Group Session:  S: Doing okay today  O: In the Stress and Anxiety Management group session, patients were educated on the physiological and psychological impacts of stress and anxiety. The session's core objective was to equip patients with practical, evidence-based strategies for managing stress. This included introducing and guiding them through various techniques such as the Physiological Sigh, Focused Vision, Grounding Techniques, Progressive  Muscle Relaxation, Guided Imagery, Mindful Movement Practices, and Nature Therapy. Emphasizing the importance of regular practice, patients were encouraged to apply these techniques in different emotional states to determine their effectiveness. The session also involved discussions where patients shared personal experiences and coping mechanisms. Educational materials providing detailed step-by-step guides for each technique were distributed, allowing for ongoing practice and reference. Throughout the session, patients showed varying degrees of engagement, ranging from active participation to attentive listening, indicating diverse approaches to learning and integrating new stress management skills.   A: The patient demonstrated a high level of engagement and active participation throughout the session. They shared personal experiences related to stress and anxiety, indicating a good level of self-awareness and insight into their own stress triggers and responses. The patient showed particular interest in the hands-on practices, such as the Physiological Sigh and Guided Imagery, and actively participated in the practice sessions. This level of engagement suggests a readiness and willingness to incorporate new stress management techniques into their daily routine. The patient's active participation also indicates a positive prognosis for implementing these strategies effectively and a potential for improved management of stress and anxiety.   P: Continue to attend PHP OT group sessions 5x week for 4 weeks to promote daily structure, social engagement, and opportunities to develop and utilize adaptive strategies to maximize functional performance in preparation for safe transition and integration back into school, work, and the community. Plan to address topic of pt 2 in next OT group session.                   OT Education - 07/12/22 0937     Education Details Stress / Anxiety 1               OT Short Term Goals - 06/25/22 1025       OT SHORT TERM GOAL #1   Title Patient will be educated on strategies to improve psychosocial skills needed to participate fully in all daily, work, and leisure activities.    Time 4    Period Weeks    Status On-going    Target Date 07/25/22      OT SHORT TERM GOAL #2   Title Pt will apply psychosocial skills and coping mechanisms to daily activities in order to function independently and reintegrate into community dwelling      OT [place] #3   Title Pt will recall and/or apply 1-3 sleep hygiene strategies to improve BADL routine upon reintegrating into community      OT SHORT TERM GOAL #4   Title Pt will choose and/or engage in 1-3 socially engaging leisure activities to improve social participation skills upon reintegrating into  community      OT SHORT TERM GOAL #5   Title Pt will engage in goal setting to improve functional BADL/IADL routine upon reintegrating into community                      Plan - 07/12/22 0938     Psychosocial Skills Coping Strategies;Habits;Interpersonal Interaction;Routines and Behaviors             Patient will benefit from skilled therapeutic intervention in order to improve the following deficits and impairments:       Psychosocial Skills: Coping Strategies, Habits, Interpersonal Interaction, Routines and Behaviors   Visit Diagnosis: Difficulty coping    Problem List Patient Active Problem List   Diagnosis Date Noted   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   Acute on chronic diastolic congestive heart failure ([place]) 41/66/0630   Acute diastolic heart failure ([place])    Pulmonary edema 08/07/2019   Sinus tachycardia 05/17/2019   Atypical chest pain 02/11/2019   Pulmonary hypertension, unspecified ([place]) 02/11/2019   Functional systolic murmur 16/08/930   Inadequate sleep hygiene 12/08/2018   Psychophysiological insomnia 12/08/2018    Obesity hypoventilation syndrome ([place]) 12/08/2018   OSA (obstructive sleep apnea) 12/08/2018   Chronic migraine without aura without status migrainosus, not intractable 09/21/2018   Bipolar 1 disorder, depressed, severe ([place]) 09/08/2018   Endometrium, polyp 01/15/2018    Class: Present on Admission   Fibroids, submucosal 01/15/2018    Class: Present on Admission   Iron deficiency anemia due to chronic blood loss 09/16/2017   Menorrhagia with regular cycle    Symptomatic anemia 08/28/2017   Acute blood loss anemia 08/28/2017   Bipolar disorder ([place]) 08/28/2017   Anxiety 08/28/2017   Depression with anxiety 08/28/2017   Fibroids 08/28/2017   PCOS (polycystic ovarian syndrome) 08/28/2017   Fever 08/28/2017   HTN (hypertension) 08/28/2017   Morbid obesity ([place]) 12/04/2015   Pregnancy with history of uterine myomectomy 04/07/2015   History of right oophorectomy 02/21/2015   History of recurrent miscarriages 02/01/2015   Vitamin D deficiency 02/01/2015    Brantley Stage, OT 07/12/2022, 9:38 AM  [facility], [place], [place], [address] Phone: [phone]   Fax:  [phone]  Name: Christine [name] MRN: [id number] Date of Birth: Jun 07, 1976

## 2022-07-15 ENCOUNTER — Other Ambulatory Visit (HOSPITAL_COMMUNITY): Payer: Medicare Other

## 2022-07-15 ENCOUNTER — Encounter (HOSPITAL_COMMUNITY): Payer: Self-pay

## 2022-07-15 ENCOUNTER — Other Ambulatory Visit (HOSPITAL_COMMUNITY): Payer: Medicare Other | Admitting: Licensed Clinical Social Worker

## 2022-07-15 DIAGNOSIS — F314 Bipolar disorder, current episode depressed, severe, without psychotic features: Secondary | ICD-10-CM

## 2022-07-15 DIAGNOSIS — R4589 Other symptoms and signs involving emotional state: Secondary | ICD-10-CM

## 2022-07-15 DIAGNOSIS — F411 Generalized anxiety disorder: Secondary | ICD-10-CM

## 2022-07-15 DIAGNOSIS — F3162 Bipolar disorder, current episode mixed, moderate: Secondary | ICD-10-CM | POA: Diagnosis not present

## 2022-07-15 NOTE — Psych (Signed)
Virtual Visit via Video Note  I connected with Christine [name] on 06/26/22 at  9:00 AM EST by a video enabled telemedicine application and verified that I am speaking with the correct person using two identifiers.  Location: Patient: patient home Provider: clinical home office   I discussed the limitations of evaluation and management by telemedicine and the availability of in person appointments. The patient expressed understanding and agreed to proceed.  I discussed the assessment and treatment plan with the patient. The patient was provided an opportunity to ask questions and all were answered. The patient agreed with the plan and demonstrated an understanding of the instructions.   The patient was advised to call back or seek an in-person evaluation if the symptoms worsen or if the condition fails to improve as anticipated.  Pt was provided 240 minutes of non-face-to-face time during this encounter.   [name], LCSW   [facility] PHP THERAPIST PROGRESS NOTE  Christine [name] [id number]  Session Time: 9:00 - 10:00  Participation Level: Active  Behavioral Response: CasualAlertDepressed  Type of Therapy: Group Therapy  Treatment Goals addressed: Coping  Progress Towards Goals: Progressing  Interventions: CBT, DBT, Supportive, and Reframing  Summary: Christine [name] is a 46 y.o. female who presents with depression symptoms.  Clinician led check-in regarding current stressors and situation, and review of patient completed daily inventory. Clinician utilized active listening and empathetic response and validated patient emotions. Clinician facilitated processing group on pertinent issues.?   Therapist Response: Patient arrived within time allowed. Patient rates her mood at a 2 on a scale of 1-10 with 10 being best. Pt states she feels "depressed." Pt reports she woke up crying and didn't want to come today. Pt states she feels pessimistic, irritable, and snappy.  Pt reports  passive SI and denies plan or intent. Patient able to process. Patient engaged in discussion.       Session Time: 10:00 am - 11:00 am   Participation Level: Active   Behavioral Response: CasualAlertDepressed   Type of Therapy: Group Therapy   Treatment Goals addressed: Coping   Progress Towards Goals: Progressing   Interventions: CBT, DBT, Solution Focused, Strength-based, Supportive, and Reframing   Therapist Response: Cln led discussion on unrealistic expectations and the ways expectations can alter perspective. Group members discussed feelings and situations that have occurred due to expectation and how to know if they are unrealistic. Cln brought in CBT thought challenging and reframing to aid discussion.   Therapist Response:  Pt engaged in discussion and reports gaining insight.          Session Time: 11:00 -12:00   Participation Level: Active   Behavioral Response: CasualAlertDepressed   Type of Therapy: Group Therapy, Spiritual Care   Treatment Goals addressed: Coping   Progress Towards Goals: Progressing   Interventions: Supportive, Education   Summary:  Alain Marion, Chaplain, led group.   Therapist Response: Pt participated         Session Time: 12:00 -1:00   Participation Level: Active   Behavioral Response: CasualAlertDepressed   Type of Therapy: Group therapy, Occupational Therapy   Treatment Goals addressed: Coping   Progress Towards Goals: Progressing   Interventions: Supportive; Psychoeducation   Summary: 12:00 - 12:50: Occupational Therapy group led by cln E. Hollan. 12:50 - 1:00 Clinician assessed for immediate needs, medication compliance and efficacy, and safety concerns.   Therapist Response: 12:00 - 12:50: See OT note 12:50 - 1:00 pm: At check-out, patient reports no immediate concerns. Patient demonstrates progress as  evidenced by coming to group when she didn't want to. Patient denies SI/HI/self-harm thoughts at the end of  group.  Suicidal/Homicidal: Nowithout intent/plan  Plan: Pt will continue in PHP while working to decrease depression symptoms, increase emotion regulation, and increase ability to manage symptoms in a healthy manner.   Collaboration of Care: Medication Management AEB A Pashayan  Patient/Guardian was advised Release of Information must be obtained prior to any record release in order to collaborate their care with an outside provider. Patient/Guardian was advised if they have not already done so to contact the registration department to sign all necessary forms in order for [place] to release information regarding their care.   Consent: Patient/Guardian gives verbal consent for treatment and assignment of benefits for services provided during this visit. Patient/Guardian expressed understanding and agreed to proceed.   Diagnosis: Bipolar 1 disorder, depressed, severe ([place]) [F31.4]    1. Bipolar 1 disorder, depressed, severe ([place])   2. Generalized anxiety disorder     [name], LCSW

## 2022-07-15 NOTE — Therapy (Signed)
[place], [place], [address] Phone: [phone]   Fax:  [phone]  Occupational Therapy Treatment Virtual Visit via Video Note  I connected with Christine [name] on 07/15/22 at  8:00 AM EST by a video enabled telemedicine application and verified that I am speaking with the correct person using two identifiers.  Location: Patient: home Provider: office   I discussed the limitations of evaluation and management by telemedicine and the availability of in person appointments. The patient expressed understanding and agreed to proceed.    The patient was advised to call back or seek an in-person evaluation if the symptoms worsen or if the condition fails to improve as anticipated.  I provided 55 minutes of non-face-to-face time during this encounter.   Patient Details  Name: Christine [name] MRN: [id number] Date of Birth: January 07, 1976 No data recorded  Encounter Date: 07/15/2022   OT End of Session - 07/15/22 2019     Visit Number 12    Number of Visits 20    Date for OT Re-Evaluation 07/25/22    OT Start Time 1200    OT Stop Time 1255    OT Time Calculation (min) 55 min             Past Medical History:  Diagnosis Date   Anemia    Presumably from menorrhagia/menomenorrhagia; has had both blood and iron transfusions   Anxiety    Back pain    Bipolar 1 disorder ([place])    With depression   Chest pain    Depression    Diastolic heart failure ([place])    Essential hypertension    Fibromyalgia    Headache    History of blood transfusion 08/2017   WL   History of hiatal hernia    IBS (irritable bowel syndrome)    Infertility, female    Infertility, female    Lactose intolerance    Lower extremity edema    Migraines    Morbid obesity (HCC)    Osteoarthritis    Hands, ankles; HLA-B27 positive   PCOS (polycystic ovarian syndrome)    Prediabetes    Seasonal allergies    With recurrent  allergic rhinitis   Sleep apnea    SOB (shortness of breath)    Uterine leiomyoma    Vitamin D deficiency     Past Surgical History:  Procedure Laterality Date   CARDIAC CT ANGIOGRAM  03/2019    Ca Score 0. Normal Coronary origin R dominant. No evidence of CAD. ? Liver nodules - poorly visualized (consider MRI w & w/o Gad contrast)   CERVICAL POLYPECTOMY  01/15/2018   Procedure: CERVICAL POLYPECTOMY;  Surgeon: Arvella Nigh, MD;  Location: [facility];  Service: Gynecology;;   COLONOSCOPY  2008   Normal   DIAGNOSTIC LAPAROSCOPY  08/1999   dermoid cyst, RSO   DILATION AND CURETTAGE OF UTERUS  05/2004   MAB   DILATION AND CURETTAGE OF UTERUS  04/2015   HYSTEROSCOPY WITH D & C  07/22/2011   Procedure: DILATATION AND CURETTAGE /HYSTEROSCOPY;  Surgeon: Cyril Mourning, MD;  Location: [facility];  Service: Gynecology;;   HYSTEROSCOPY WITH D & C N/A 01/15/2018   Procedure: DILATATION AND CURETTAGE [place] WITH MYOSURE;  Surgeon: Arvella Nigh, MD;  Location: [facility];  Service: Gynecology;  Laterality: N/A;   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  Suggestion of high cardiac output (possibly related to anemia thyrotoxicosis, Pregnancy, sepsis etc.) -- was in setting of symptomatic anemia - Hgb 5.7    There were no vitals filed for this visit.   Subjective Assessment - 07/15/22 2018     Currently in Pain? No/denies    Pain Score 0-No pain                 Group Session:  S: feeling a bit better today  O: Today's OT group focused on the topic of identifying patterns of self-sabotaging behavior following personal achievements. The members have self-reported engaging in excessive behaviors such as overeating and overspending as a form of self-reward. The primary goal of this group project is to facilitate understanding of the neurophysiological underpinnings of their behaviors, introduce cognitive  reframing techniques, and develop practical strategies to disrupt the cycle of self-sabotage. The intent is to enhance their capacity for daily functioning and improve management of symptoms related to anxiety and depression.   A: The patient is present during group sessions but tends to engage passively, often observing rather than participating in discussions and activities. They listen attentively to others' contributions and appear to be processing the information shared within the group. However, they offer minimal personal insight or reflection on their own behaviors. There is a hesitancy to actively apply suggested strategies or to explore the deeper emotional aspects of their self-sabotaging actions. This passive stance may indicate a level of ambivalence or internal resistance to change, which could be a focus for further individual therapeutic exploration to enhance engagement   P: Continue to attend PHP OT group sessions 5x week for 4 weeks to promote daily structure, social engagement, and opportunities to develop and utilize adaptive strategies to maximize functional performance in preparation for safe transition and integration back into school, work, and the community. Plan to address topic of pt 2 in next OT group session.                  OT Education - 07/15/22 2018     Education Details Practical Steps for Breaking the Self-Sabotage Cycle 1              OT Short Term Goals - 06/25/22 1025       OT SHORT TERM GOAL #1   Title Patient will be educated on strategies to improve psychosocial skills needed to participate fully in all daily, work, and leisure activities.    Time 4    Period Weeks    Status On-going    Target Date 07/25/22      OT SHORT TERM GOAL #2   Title Pt will apply psychosocial skills and coping mechanisms to daily activities in order to function independently and reintegrate into community dwelling      OT [place] #3   Title Pt  will recall and/or apply 1-3 sleep hygiene strategies to improve BADL routine upon reintegrating into community      OT [place] #4   Title Pt will choose and/or engage in 1-3 socially engaging leisure activities to improve social participation skills upon reintegrating into community      OT [place] #5   Title Pt will engage in goal setting to improve functional BADL/IADL routine upon reintegrating into community                      Plan - 07/15/22 2020     Psychosocial  Skills Coping Strategies;Habits;Interpersonal Interaction;Routines and Behaviors             Patient will benefit from skilled therapeutic intervention in order to improve the following deficits and impairments:       Psychosocial Skills: Coping Strategies, Habits, Interpersonal Interaction, Routines and Behaviors   Visit Diagnosis: Difficulty coping    Problem List Patient Active Problem List   Diagnosis Date Noted   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   Acute on chronic diastolic congestive heart failure ([place]) 15/72/6203   Acute diastolic heart failure ([place])    Pulmonary edema 08/07/2019   Sinus tachycardia 05/17/2019   Atypical chest pain 02/11/2019   Pulmonary hypertension, unspecified ([place]) 02/11/2019   Functional systolic murmur 55/97/4163   Inadequate sleep hygiene 12/08/2018   Psychophysiological insomnia 12/08/2018   Obesity hypoventilation syndrome ([place]) 12/08/2018   OSA (obstructive sleep apnea) 12/08/2018   Chronic migraine without aura without status migrainosus, not intractable 09/21/2018   Bipolar 1 disorder, depressed, severe ([place]) 09/08/2018   Endometrium, polyp 01/15/2018    Class: Present on Admission   Fibroids, submucosal 01/15/2018    Class: Present on Admission   Iron deficiency anemia due to chronic blood loss 09/16/2017   Menorrhagia with regular cycle    Symptomatic anemia 08/28/2017   Acute blood loss anemia  08/28/2017   Bipolar disorder ([place]) 08/28/2017   Anxiety 08/28/2017   Depression with anxiety 08/28/2017   Fibroids 08/28/2017   PCOS (polycystic ovarian syndrome) 08/28/2017   Fever 08/28/2017   HTN (hypertension) 08/28/2017   Morbid obesity ([place]) 12/04/2015   Pregnancy with history of uterine myomectomy 04/07/2015   History of right oophorectomy 02/21/2015   History of recurrent miscarriages 02/01/2015   Vitamin D deficiency 02/01/2015    Brantley Stage, OT 07/15/2022, 8:20 PM [facility], [place], [place], [address] Phone: [phone]   Fax:  [phone]  Name: Christine [name] MRN: [id number] Date of Birth: 02/14/1976

## 2022-07-15 NOTE — Therapy (Signed)
[place], [place], [address] Phone: [phone]   Fax:  [phone]  Occupational Therapy Treatment Virtual Visit via Video Note  I connected with Christine [name] on 07/15/22 at  8:00 AM EST by a video enabled telemedicine application and verified that I am speaking with the correct person using two identifiers.  Location: Patient: home Provider: office   I discussed the limitations of evaluation and management by telemedicine and the availability of in person appointments. The patient expressed understanding and agreed to proceed.    The patient was advised to call back or seek an in-person evaluation if the symptoms worsen or if the condition fails to improve as anticipated.  I provided 55 minutes of non-face-to-face time during this encounter.   Patient Details  Name: Christine [name] MRN: [id number] Date of Birth: October 15, 1975 No data recorded  Encounter Date: 07/12/2022   OT End of Session - 07/15/22 0900     Visit Number 11    Number of Visits 20    Date for OT Re-Evaluation 07/25/22    OT Start Time 1200    OT Stop Time 6063    OT Time Calculation (min) 55 min             Past Medical History:  Diagnosis Date   Anemia    Presumably from menorrhagia/menomenorrhagia; has had both blood and iron transfusions   Anxiety    Back pain    Bipolar 1 disorder ([place])    With depression   Chest pain    Depression    Diastolic heart failure ([place])    Essential hypertension    Fibromyalgia    Headache    History of blood transfusion 08/2017   WL   History of hiatal hernia    IBS (irritable bowel syndrome)    Infertility, female    Infertility, female    Lactose intolerance    Lower extremity edema    Migraines    Morbid obesity ([place])    Osteoarthritis    Hands, ankles; HLA-B27 positive   PCOS (polycystic ovarian syndrome)    Prediabetes    Seasonal allergies    With recurrent  allergic rhinitis   Sleep apnea    SOB (shortness of breath)    Uterine leiomyoma    Vitamin D deficiency     Past Surgical History:  Procedure Laterality Date   CARDIAC CT ANGIOGRAM  03/2019    Ca Score 0. Normal Coronary origin R dominant. No evidence of CAD. ? Liver nodules - poorly visualized (consider MRI w & w/o Gad contrast)   CERVICAL POLYPECTOMY  01/15/2018   Procedure: CERVICAL POLYPECTOMY;  Surgeon: Arvella Nigh, MD;  Location: [facility];  Service: Gynecology;;   COLONOSCOPY  2008   Normal   DIAGNOSTIC LAPAROSCOPY  08/1999   dermoid cyst, RSO   DILATION AND CURETTAGE OF UTERUS  05/2004   MAB   DILATION AND CURETTAGE OF UTERUS  04/2015   HYSTEROSCOPY WITH D & C  07/22/2011   Procedure: DILATATION AND CURETTAGE /HYSTEROSCOPY;  Surgeon: Cyril Mourning, MD;  Location: [facility];  Service: Gynecology;;   HYSTEROSCOPY WITH D & C N/A 01/15/2018   Procedure: DILATATION AND CURETTAGE [place] WITH MYOSURE;  Surgeon: Arvella Nigh, MD;  Location: [facility];  Service: Gynecology;  Laterality: N/A;   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  Suggestion of high cardiac output (possibly related to anemia thyrotoxicosis, Pregnancy, sepsis etc.) -- was in setting of symptomatic anemia - Hgb 5.7    There were no vitals filed for this visit.   Subjective Assessment - 07/15/22 0900     Currently in Pain? No/denies    Pain Score 0-No pain                 Group Session:  S: Feeling better today  O: In the Stress and Anxiety Management group session, patients were educated on the physiological and psychological impacts of stress and anxiety. The session's core objective was to equip patients with practical, evidence-based strategies for managing stress. This included introducing and guiding them through various techniques such as the Physiological Sigh, Focused Vision, Grounding Techniques,  Progressive Muscle Relaxation, Guided Imagery, Mindful Movement Practices, and Nature Therapy. Emphasizing the importance of regular practice, patients were encouraged to apply these techniques in different emotional states to determine their effectiveness. The session also involved discussions where patients shared personal experiences and coping mechanisms. Educational materials providing detailed step-by-step guides for each technique were distributed, allowing for ongoing practice and reference. Throughout the session, patients showed varying degrees of engagement, ranging from active participation to attentive listening, indicating diverse approaches to learning and integrating new stress management skills.   A: The patient demonstrated a high level of engagement and active participation throughout the session. They shared personal experiences related to stress and anxiety, indicating a good level of self-awareness and insight into their own stress triggers and responses. The patient showed particular interest in the hands-on practices, such as the Physiological Sigh and Guided Imagery, and actively participated in the practice sessions. This level of engagement suggests a readiness and willingness to incorporate new stress management techniques into their daily routine. The patient's active participation also indicates a positive prognosis for implementing these strategies effectively and a potential for improved management of stress and anxiety.   P: Continue to attend PHP OT group sessions 5x week for 4 weeks to promote daily structure, social engagement, and opportunities to develop and utilize adaptive strategies to maximize functional performance in preparation for safe transition and integration back into school, work, and the community. Plan to address topic of tbd in next OT group session.                  OT Education - 07/15/22 0900     Education Details Stress / Anxiety 2               OT Short Term Goals - 06/25/22 1025       OT SHORT TERM GOAL #1   Title Patient will be educated on strategies to improve psychosocial skills needed to participate fully in all daily, work, and leisure activities.    Time 4    Period Weeks    Status On-going    Target Date 07/25/22      OT SHORT TERM GOAL #2   Title Pt will apply psychosocial skills and coping mechanisms to daily activities in order to function independently and reintegrate into community dwelling      OT [place] #3   Title Pt will recall and/or apply 1-3 sleep hygiene strategies to improve BADL routine upon reintegrating into community      OT SHORT TERM GOAL #4   Title Pt will choose and/or engage in 1-3 socially engaging leisure activities to improve social participation skills upon reintegrating into community  OT SHORT TERM GOAL #5   Title Pt will engage in goal setting to improve functional BADL/IADL routine upon reintegrating into community                      Plan - 07/15/22 0900     Psychosocial Skills Coping Strategies;Habits;Interpersonal Interaction;Routines and Behaviors             Patient will benefit from skilled therapeutic intervention in order to improve the following deficits and impairments:       Psychosocial Skills: Coping Strategies, Habits, Interpersonal Interaction, Routines and Behaviors   Visit Diagnosis: Difficulty coping    Problem List Patient Active Problem List   Diagnosis Date Noted   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   Acute on chronic diastolic congestive heart failure ([place]) 39/76/7341   Acute diastolic heart failure ([place])    Pulmonary edema 08/07/2019   Sinus tachycardia 05/17/2019   Atypical chest pain 02/11/2019   Pulmonary hypertension, unspecified ([place]) 02/11/2019   Functional systolic murmur 93/79/0240   Inadequate sleep hygiene 12/08/2018   Psychophysiological insomnia  12/08/2018   Obesity hypoventilation syndrome ([place]) 12/08/2018   OSA (obstructive sleep apnea) 12/08/2018   Chronic migraine without aura without status migrainosus, not intractable 09/21/2018   Bipolar 1 disorder, depressed, severe ([place]) 09/08/2018   Endometrium, polyp 01/15/2018    Class: Present on Admission   Fibroids, submucosal 01/15/2018    Class: Present on Admission   Iron deficiency anemia due to chronic blood loss 09/16/2017   Menorrhagia with regular cycle    Symptomatic anemia 08/28/2017   Acute blood loss anemia 08/28/2017   Bipolar disorder ([place]) 08/28/2017   Anxiety 08/28/2017   Depression with anxiety 08/28/2017   Fibroids 08/28/2017   PCOS (polycystic ovarian syndrome) 08/28/2017   Fever 08/28/2017   HTN (hypertension) 08/28/2017   Morbid obesity ([place]) 12/04/2015   Pregnancy with history of uterine myomectomy 04/07/2015   History of right oophorectomy 02/21/2015   History of recurrent miscarriages 02/01/2015   Vitamin D deficiency 02/01/2015    Brantley Stage, OT 07/15/2022, 9:00 AM  [facility], [place], [place], [address] Phone: [phone]   Fax:  [phone]  Name: Pippa Hanif MRN: [id number] Date of Birth: 1976-03-30

## 2022-07-15 NOTE — Psych (Signed)
Virtual Visit via Video Note  I connected with Christine [name] on 07/02/22 at  9:00 AM EST by a video enabled telemedicine application and verified that I am speaking with the correct person using two identifiers.  Location: Patient: patient home Provider: clinical home office   I discussed the limitations of evaluation and management by telemedicine and the availability of in person appointments. The patient expressed understanding and agreed to proceed.  I discussed the assessment and treatment plan with the patient. The patient was provided an opportunity to ask questions and all were answered. The patient agreed with the plan and demonstrated an understanding of the instructions.   The patient was advised to call back or seek an in-person evaluation if the symptoms worsen or if the condition fails to improve as anticipated.  Pt was provided 240 minutes of non-face-to-face time during this encounter.   [name], LCSW   [facility] PHP THERAPIST PROGRESS NOTE  Christine [name] [id number]  Session Time: 9:00 - 10:00  Participation Level: Active  Behavioral Response: CasualAlertDepressed  Type of Therapy: Group Therapy  Treatment Goals addressed: Coping  Progress Towards Goals: Progressing  Interventions: CBT, DBT, Supportive, and Reframing  Summary: Christine [name] is a 46 y.o. female who presents with depression symptoms.  Clinician led check-in regarding current stressors and situation, and review of patient completed daily inventory. Clinician utilized active listening and empathetic response and validated patient emotions. Clinician facilitated processing group on pertinent issues.?   Therapist Response: Patient arrived within time allowed. Patient rates her mood at a 3 on a scale of 1-10 with 10 being best. Pt states she was busy yesterday with errands and felt irritable and overwhelmed.  Pt reports passive SI and denies plan or intent. Patient able to process. Patient  engaged in discussion.       Session Time: 10:00 am - 11:00 am   Participation Level: Active   Behavioral Response: CasualAlertDepressed   Type of Therapy: Group Therapy   Treatment Goals addressed: Coping   Progress Towards Goals: Progressing   Interventions: CBT, DBT, Solution Focused, Strength-based, Supportive, and Reframing   Therapist Response:  Cln led processing group for pt's current struggles. Group members shared stressors and provided support and feedback. Cln brought in topics of boundaries, healthy relationships, and unhealthy thought processes to inform discussion.    Therapist Response: Pt able to process and provide support to group.          Session Time: 11:00 -12:00   Participation Level: Active   Behavioral Response: CasualAlertDepressed   Type of Therapy: Group Therapy   Treatment Goals addressed: Coping   Progress Towards Goals: Progressing   Interventions: CBT, DBT, Solution Focused, Strength-based, Supportive, and Reframing   Summary: Cln led discussion on DBT dialectics and balance. Cln encouraged pt's to utilize AND statements to cue their brain to hold two opposing ideas. Cln provided examples and group members created their own AND statements.   Therapist Response: Pt engaged in discussion and created AND statement around recognzing progress.          Session Time: 12:00 -1:00   Participation Level: Active   Behavioral Response: CasualAlertDepressed   Type of Therapy: Group therapy, Occupational Therapy   Treatment Goals addressed: Coping   Progress Towards Goals: Progressing   Interventions: Supportive; Psychoeducation   Summary: 12:00 - 12:50: Occupational Therapy group led by cln E. Hollan. 12:50 - 1:00 Clinician assessed for immediate needs, medication compliance and efficacy, and safety concerns.   Therapist Response:  12:00 - 12:50: See OT note 12:50 - 1:00 pm: At check-out, patient reports no immediate concerns.  Patient demonstrates progress as evidenced by increased self-awareness. Patient denies SI/HI/self-harm thoughts at the end of group.  Suicidal/Homicidal: Nowithout intent/plan  Plan: Pt will continue in PHP while working to decrease depression symptoms, increase emotion regulation, and increase ability to manage symptoms in a healthy manner.   Collaboration of Care: Medication Management AEB A [place]  Patient/Guardian was advised Release of Information must be obtained prior to any record release in order to collaborate their care with an outside provider. Patient/Guardian was advised if they have not already done so to contact the registration department to sign all necessary forms in order for [place] to release information regarding their care.   Consent: Patient/Guardian gives verbal consent for treatment and assignment of benefits for services provided during this visit. Patient/Guardian expressed understanding and agreed to proceed.   Diagnosis: Bipolar 1 disorder, mixed, moderate (HCC) [F31.62]    1. Bipolar 1 disorder, mixed, moderate ([place])   2. Generalized anxiety disorder     [name], LCSW

## 2022-07-15 NOTE — Psych (Signed)
Virtual Visit via Video Note  I connected with Christine [name] on 07/03/22 at  9:00 AM EST by a video enabled telemedicine application and verified that I am speaking with the correct person using two identifiers.  Location: Patient: patient home Provider: clinical home office   I discussed the limitations of evaluation and management by telemedicine and the availability of in person appointments. The patient expressed understanding and agreed to proceed.  I discussed the assessment and treatment plan with the patient. The patient was provided an opportunity to ask questions and all were answered. The patient agreed with the plan and demonstrated an understanding of the instructions.   The patient was advised to call back or seek an in-person evaluation if the symptoms worsen or if the condition fails to improve as anticipated.  Pt was provided 240 minutes of non-face-to-face time during this encounter.   [name], LCSW   [facility] PHP THERAPIST PROGRESS NOTE  Christine [name] [id number]  Session Time: 9:00 - 10:00  Participation Level: Active  Behavioral Response: CasualAlertDepressed  Type of Therapy: Group Therapy  Treatment Goals addressed: Coping  Progress Towards Goals: Progressing  Interventions: CBT, DBT, Supportive, and Reframing  Summary: Christine [name] is a 46 y.o. female who presents with depression symptoms.  Clinician led check-in regarding current stressors and situation, and review of patient completed daily inventory. Clinician utilized active listening and empathetic response and validated patient emotions. Clinician facilitated processing group on pertinent issues.?   Therapist Response: Patient arrived within time allowed. Patient rates her mood at a 3 on a scale of 1-10 with 10 being best. Pt states she feels "drained." Pt reports feeling overwhelmed and angry yesterday. Pt reports frustration with herself and MH that she cannot do at her "healthy" level.  Pt reports passive SI and denies plan or intent. Patient able to process. Patient engaged in discussion.       Session Time: 10:00 am - 11:00 am   Participation Level: Active   Behavioral Response: CasualAlertDepressed   Type of Therapy: Group Therapy   Treatment Goals addressed: Coping   Progress Towards Goals: Progressing   Interventions: CBT, DBT, Solution Focused, Strength-based, Supportive, and Reframing   Therapist Response: Cln led discussion on support groups and the role they can provide in recovery and ongoing treatment. Group discussed success and barriers they have had with past support groups or they have with considering support groups. Cln provided resources for local support groups.    Therapist Response:  Pt engaged in discussion and reports interest in engaging in support groups.          Session Time: 11:00 -12:00   Participation Level: Active   Behavioral Response: CasualAlertDepressed   Type of Therapy: Group Therapy, Spiritual Care   Treatment Goals addressed: Coping   Progress Towards Goals: Progressing   Interventions: Supportive, Education   Summary:  Alain Marion, Chaplain, led group.   Therapist Response: Pt participated         Session Time: 12:00 -1:00   Participation Level: Active   Behavioral Response: CasualAlertDepressed   Type of Therapy: Group therapy, Occupational Therapy   Treatment Goals addressed: Coping   Progress Towards Goals: Progressing   Interventions: Supportive; Psychoeducation   Summary: 12:00 - 12:50: Occupational Therapy group led by cln E. Hollan. 12:50 - 1:00 Clinician assessed for immediate needs, medication compliance and efficacy, and safety concerns.   Therapist Response: 12:00 - 12:50: See OT note 12:50 - 1:00 pm: At check-out, patient reports no immediate  concerns. Patient demonstrates progress as evidenced by attempting distractions. Patient denies SI/HI/self-harm thoughts at the end of  group.  Suicidal/Homicidal: Nowithout intent/plan  Plan: Pt will continue in PHP while working to decrease depression symptoms, increase emotion regulation, and increase ability to manage symptoms in a healthy manner.   Collaboration of Care: Medication Management AEB A Pashayan  Patient/Guardian was advised Release of Information must be obtained prior to any record release in order to collaborate their care with an outside provider. Patient/Guardian was advised if they have not already done so to contact the registration department to sign all necessary forms in order for [place] to release information regarding their care.   Consent: Patient/Guardian gives verbal consent for treatment and assignment of benefits for services provided during this visit. Patient/Guardian expressed understanding and agreed to proceed.   Diagnosis: Bipolar 1 disorder, depressed, severe ([place]) [F31.4]    1. Bipolar 1 disorder, depressed, severe ([place])   2. Generalized anxiety disorder     [name], LCSW

## 2022-07-15 NOTE — Psych (Signed)
Virtual Visit via Video Note  I connected with [name] on 07/04/22 at  9:00 AM EST by a video enabled telemedicine application and verified that I am speaking with the correct person using two identifiers.  Location: Patient: patient home Provider: clinical home office   I discussed the limitations of evaluation and management by telemedicine and the availability of in person appointments. The patient expressed understanding and agreed to proceed.  I discussed the assessment and treatment plan with the patient. The patient was provided an opportunity to ask questions and all were answered. The patient agreed with the plan and demonstrated an understanding of the instructions.   The patient was advised to call back or seek an in-person evaluation if the symptoms worsen or if the condition fails to improve as anticipated.  Pt was provided 240 minutes of non-face-to-face time during this encounter.   [name], LCSW   [facility] PHP THERAPIST PROGRESS NOTE  Kairee Kozma [id number]  Session Time: 9:00 - 10:00  Participation Level: Active  Behavioral Response: CasualAlertDepressed  Type of Therapy: Group Therapy  Treatment Goals addressed: Coping  Progress Towards Goals: Progressing  Interventions: CBT, DBT, Supportive, and Reframing  Summary: Opaline Reyburn is a 46 y.o. female who presents with depression symptoms.  Clinician led check-in regarding current stressors and situation, and review of patient completed daily inventory. Clinician utilized active listening and empathetic response and validated patient emotions. Clinician facilitated processing group on pertinent issues.?   Therapist Response: Patient arrived within time allowed. Patient rates her mood at a 4 on a scale of 1-10 with 10 being best. Pt states she feels "not so bad." Pt reports she left the house for an appt and it spiked her anxiety, however she managed. Pt reports struggle to fall asleep. Patient  able to process. Patient engaged in discussion.       Session Time: 10:00 am - 11:00 am   Participation Level: Active   Behavioral Response: CasualAlertDepressed   Type of Therapy: Group Therapy   Treatment Goals addressed: Coping   Progress Towards Goals: Progressing   Interventions: CBT, DBT, Solution Focused, Strength-based, Supportive, and Reframing   Therapist Response: Cln led discussion on trust and how to establish healthy trust. Group discussed common missteps such as disclosing personal information too soon, ignoring behaviors being displayed, inaccurately defining the relationship, and not giving people credit. Cln utilized CBT thought challenging principles to encourage pt's to rely on "evidence" versus feelings. Group members shared struggles they experience with trust.    Therapist Response:  Pt engaged in discussion and reports struggling with trust.         Session Time: 11:00 -12:00   Participation Level: Active   Behavioral Response: CasualAlertDepressed   Type of Therapy: Group Therapy   Treatment Goals addressed: Coping   Progress Towards Goals: Progressing   Interventions: CBT, DBT, Solution Focused, Strength-based, Supportive, and Reframing   Summary: Cln led discussion on feelings and the role they play for our lives. Cln contextualized feelings as a warning system that brings attention to areas of our lives that need focus. Group members discussed how to pay attention to what the feeling is telling us versus reacting to unpleasant aspects of a feeling.    Therapist Response: Pt engaged in discussion and reports understanding.          Session Time: 12:00 -1:00   Participation Level: Active   Behavioral Response: CasualAlertDepressed   Type of Therapy: Group therapy, Occupational Therapy   Treatment  Goals addressed: Coping   Progress Towards Goals: Progressing   Interventions: Supportive; Psychoeducation   Summary: 12:00 - 12:50:  Occupational Therapy group led by cln E. Hollan. 12:50 - 1:00 Clinician assessed for immediate needs, medication compliance and efficacy, and safety concerns.   Therapist Response: 12:00 - 12:50: See OT note 12:50 - 1:00 pm: At check-out, patient reports no immediate concerns. Patient demonstrates progress as evidenced by getting out of the house. Patient denies SI/HI/self-harm thoughts at the end of group.  Suicidal/Homicidal: Nowithout intent/plan  Plan: Pt will continue in PHP while working to decrease depression symptoms, increase emotion regulation, and increase ability to manage symptoms in a healthy manner.   Collaboration of Care: Medication Management AEB A Pashayan  Patient/Guardian was advised Release of Information must be obtained prior to any record release in order to collaborate their care with an outside provider. Patient/Guardian was advised if they have not already done so to contact the registration department to sign all necessary forms in order for [place] to release information regarding their care.   Consent: Patient/Guardian gives verbal consent for treatment and assignment of benefits for services provided during this visit. Patient/Guardian expressed understanding and agreed to proceed.   Diagnosis: Bipolar 1 disorder, mixed, moderate ([facility]) [F31.62]    1. Bipolar 1 disorder, mixed, moderate ([place])   2. Generalized anxiety disorder     [name], LCSW

## 2022-07-15 NOTE — Psych (Signed)
Virtual Visit via Video Note  I connected with Christine [name] on 06/25/22 at  9:00 AM EST by a video enabled telemedicine application and verified that I am speaking with the correct person using two identifiers.  Location: Patient: patient home Provider: clinical home office   I discussed the limitations of evaluation and management by telemedicine and the availability of in person appointments. The patient expressed understanding and agreed to proceed.  I discussed the assessment and treatment plan with the patient. The patient was provided an opportunity to ask questions and all were answered. The patient agreed with the plan and demonstrated an understanding of the instructions.   The patient was advised to call back or seek an in-person evaluation if the symptoms worsen or if the condition fails to improve as anticipated.  Pt was provided 240 minutes of non-face-to-face time during this encounter.   [name], LCSW   [facility] PHP THERAPIST PROGRESS NOTE  Christine [name] [id number]  Session Time: 9:00 - 10:00  Participation Level: Active  Behavioral Response: CasualAlertDepressed  Type of Therapy: Group Therapy  Treatment Goals addressed: Coping  Progress Towards Goals: Initial  Interventions: CBT, DBT, Supportive, and Reframing  Summary: Christine [name] is a 46 y.o. female who presents with depression symptoms.  Clinician led check-in regarding current stressors and situation, and review of patient completed daily inventory. Clinician utilized active listening and empathetic response and validated patient emotions. Clinician facilitated processing group on pertinent issues.?   Therapist Response: Patient arrived within time allowed. Patient rates her mood at a 2 on a scale of 1-10 with 10 being best. Pt states she feels "depressed." Pt reports high stress yesterday. Pt struggles with self-judgement. Pt reports passive SI and denies plan or intent. Patient able to  process. Patient engaged in discussion.       Session Time: 10:00 am - 11:00 am   Participation Level: Active   Behavioral Response: CasualAlertDepressed   Type of Therapy: Group Therapy   Treatment Goals addressed: Coping   Progress Towards Goals: Progressing   Interventions: CBT, DBT, Solution Focused, Strength-based, Supportive, and Reframing   Therapist Response: Cln led processing group for pt's current struggles. Group members shared stressors and provided support and feedback. Cln brought in topics of boundaries, healthy relationships, and unhealthy thought processes to inform discussion.   Therapist Response:  Pt able to process and provide support to group.          Session Time: 11:00 -12:00   Participation Level: Active   Behavioral Response: CasualAlertDepressed   Type of Therapy: Group Therapy   Treatment Goals addressed: Coping   Progress Towards Goals: Progressing   Interventions: CBT, DBT, Solution Focused, Strength-based, Supportive, and Reframing   Summary: Cln led discussion on the holidays and held space for group members to process plans, feelings, and worries regarding the upcoming holiday. Cln brought in DBT ACCEPTS skills, boundaries, CBT thought challenging, and planning ahead to inform discussion when appropriate.    Therapist Response:  Pt engaged in discussion and is able to process.          Session Time: 12:00 -1:00   Participation Level: Active   Behavioral Response: CasualAlertDepressed   Type of Therapy: Group therapy, Occupational Therapy   Treatment Goals addressed: Coping   Progress Towards Goals: Progressing   Interventions: Supportive; Psychoeducation   Summary: 12:00 - 12:50: Occupational Therapy group led by cln E. Hollan. 12:50 - 1:00 Clinician assessed for immediate needs, medication compliance and efficacy, and safety concerns.  Therapist Response: 12:00 - 12:50: See OT note 12:50 - 1:00 pm: At check-out,  patient reports no immediate concerns. Patient demonstrates progress as evidenced by attempting coping skills. Patient denies SI/HI/self-harm thoughts at the end of group.  Suicidal/Homicidal: Nowithout intent/plan  Plan: Pt will continue in PHP while working to decrease depression symptoms, increase emotion regulation, and increase ability to manage symptoms in a healthy manner.   Collaboration of Care: Medication Management AEB A Pashayan  Patient/Guardian was advised Release of Information must be obtained prior to any record release in order to collaborate their care with an outside provider. Patient/Guardian was advised if they have not already done so to contact the registration department to sign all necessary forms in order for [place] to release information regarding their care.   Consent: Patient/Guardian gives verbal consent for treatment and assignment of benefits for services provided during this visit. Patient/Guardian expressed understanding and agreed to proceed.   Diagnosis: Bipolar 1 disorder, depressed, severe ([place]) [F31.4]    1. Bipolar 1 disorder, depressed, severe ([employer])   2. Generalized anxiety disorder     [name], LCSW

## 2022-07-15 NOTE — Psych (Signed)
Virtual Visit via Video Note  I connected with [name] on 07/01/22 at  9:00 AM EST by a video enabled telemedicine application and verified that I am speaking with the correct person using two identifiers.  Location: Patient: patient home Provider: clinical home office   I discussed the limitations of evaluation and management by telemedicine and the availability of in person appointments. The patient expressed understanding and agreed to proceed.  I discussed the assessment and treatment plan with the patient. The patient was provided an opportunity to ask questions and all were answered. The patient agreed with the plan and demonstrated an understanding of the instructions.   The patient was advised to call back or seek an in-person evaluation if the symptoms worsen or if the condition fails to improve as anticipated.  Pt was provided 240 minutes of non-face-to-face time during this encounter.   [name], LCSW   [facility] PHP THERAPIST PROGRESS NOTE  Christine [name] [id number]  Session Time: 9:00 - 10:00  Participation Level: Active  Behavioral Response: CasualAlertDepressed  Type of Therapy: Group Therapy  Treatment Goals addressed: Coping  Progress Towards Goals: Progressing  Interventions: CBT, DBT, Supportive, and Reframing  Summary: Christine [name] is a 46 y.o. female who presents with depression symptoms.  Clinician led check-in regarding current stressors and situation, and review of patient completed daily inventory. Clinician utilized active listening and empathetic response and validated patient emotions. Clinician facilitated processing group on pertinent issues.?   Therapist Response: Patient arrived within time allowed. Patient rates her mood at a 3 on a scale of 1-10 with 10 being best. Pt states she feels "up and down." Pt reports fear that she is shifting into a manic episode. Pt reports pressured speech, fidgetty, and productive. Pt reports  increased depression and anxiety due to her fear. Pt reports passive SI and denies plan or intent. Patient able to process. Patient engaged in discussion.       Session Time: 10:00 am - 11:00 am   Participation Level: Active   Behavioral Response: CasualAlertDepressed   Type of Therapy: Group Therapy   Treatment Goals addressed: Coping   Progress Towards Goals: Progressing   Interventions: CBT, DBT, Solution Focused, Strength-based, Supportive, and Reframing   Therapist Response: Cln introduced topic of stress management and the model of the "4 A's of stress management:" avoid, alter, accept, and adapt. Group members worked through Advice worker and discussed barriers to utilizing the 4 A's for stressors.   Therapist Response:  Pt engaged in discussion and reports understanding of how to utilize the 4 A's.          Session Time: 11:00 -12:00   Participation Level: Active   Behavioral Response: CasualAlertDepressed   Type of Therapy: Group Therapy   Treatment Goals addressed: Coping   Progress Towards Goals: Progressing   Interventions: CBT, DBT, Solution Focused, Strength-based, Supportive, and Reframing   Summary: Cln led discussion on Maslow's Hierarchy of Needs and how it can inform the way we approach self care and recovery.   Therapist Response:  Pt engaged in discussion and is able to make connections.          Session Time: 12:00 -1:00   Participation Level: Active   Behavioral Response: CasualAlertDepressed   Type of Therapy: Group therapy, Occupational Therapy   Treatment Goals addressed: Coping   Progress Towards Goals: Progressing   Interventions: Supportive; Psychoeducation   Summary: 12:00 - 12:50: Occupational Therapy group led by cln E. Hollan. 12:50 - 1:00 Clinician  assessed for immediate needs, medication compliance and efficacy, and safety concerns.   Therapist Response: 12:00 - 12:50: See OT note 12:50 - 1:00 pm: At check-out, patient  reports no immediate concerns. Patient demonstrates progress as evidenced by engaging with support system. Patient denies SI/HI/self-harm thoughts at the end of group.  Suicidal/Homicidal: Nowithout intent/plan  Plan: Pt will continue in PHP while working to decrease depression symptoms, increase emotion regulation, and increase ability to manage symptoms in a healthy manner.   Collaboration of Care: Medication Management AEB A Pashayan  Patient/Guardian was advised Release of Information must be obtained prior to any record release in order to collaborate their care with an outside provider. Patient/Guardian was advised if they have not already done so to contact the registration department to sign all necessary forms in order for [place] to release information regarding their care.   Consent: Patient/Guardian gives verbal consent for treatment and assignment of benefits for services provided during this visit. Patient/Guardian expressed understanding and agreed to proceed.   Diagnosis: Bipolar 1 disorder, mixed, moderate ([facility]) [F31.62]    1. Bipolar 1 disorder, mixed, moderate ([place])   2. Generalized anxiety disorder     [name], LCSW

## 2022-07-16 ENCOUNTER — Encounter (HOSPITAL_COMMUNITY): Payer: Self-pay

## 2022-07-16 ENCOUNTER — Other Ambulatory Visit (HOSPITAL_COMMUNITY): Payer: Medicare Other | Admitting: Licensed Clinical Social Worker

## 2022-07-16 ENCOUNTER — Other Ambulatory Visit (HOSPITAL_COMMUNITY): Payer: Medicare Other

## 2022-07-16 ENCOUNTER — Telehealth (HOSPITAL_COMMUNITY): Payer: Self-pay | Admitting: Psychiatry

## 2022-07-16 DIAGNOSIS — F314 Bipolar disorder, current episode depressed, severe, without psychotic features: Secondary | ICD-10-CM

## 2022-07-16 DIAGNOSIS — R4589 Other symptoms and signs involving emotional state: Secondary | ICD-10-CM

## 2022-07-16 DIAGNOSIS — F3162 Bipolar disorder, current episode mixed, moderate: Secondary | ICD-10-CM | POA: Diagnosis not present

## 2022-07-16 DIAGNOSIS — F411 Generalized anxiety disorder: Secondary | ICD-10-CM

## 2022-07-16 NOTE — Therapy (Signed)
[place], [place], [address] Phone: [phone]   Fax:  [phone]  Occupational Therapy Treatment Virtual Visit via Video Note  I connected with [name] on 07/16/22 at  8:00 AM EST by a video enabled telemedicine application and verified that I am speaking with the correct person using two identifiers.  Location: Patient: home Provider: office   I discussed the limitations of evaluation and management by telemedicine and the availability of in person appointments. The patient expressed understanding and agreed to proceed.    The patient was advised to call back or seek an in-person evaluation if the symptoms worsen or if the condition fails to improve as anticipated.  I provided 55 minutes of non-face-to-face time during this encounter.   Patient Details  Name: Christine [name] MRN: [id number] Date of Birth: 01-Sep-1975 No data recorded  Encounter Date: 07/16/2022   OT End of Session - 07/16/22 2007     Visit Number 13    Number of Visits 20    Date for OT Re-Evaluation 07/25/22    OT Start Time 1200    OT Stop Time 1255    OT Time Calculation (min) 55 min             Past Medical History:  Diagnosis Date   Anemia    Presumably from menorrhagia/menomenorrhagia; has had both blood and iron transfusions   Anxiety    Back pain    Bipolar 1 disorder ([place])    With depression   Chest pain    Depression    Diastolic heart failure ([place])    Essential hypertension    Fibromyalgia    Headache    History of blood transfusion 08/2017   WL   History of hiatal hernia    IBS (irritable bowel syndrome)    Infertility, female    Infertility, female    Lactose intolerance    Lower extremity edema    Migraines    Morbid obesity ([place])    Osteoarthritis    Hands, ankles; HLA-B27 positive   PCOS (polycystic ovarian syndrome)    Prediabetes    Seasonal allergies    With recurrent  allergic rhinitis   Sleep apnea    SOB (shortness of breath)    Uterine leiomyoma    Vitamin D deficiency     Past Surgical History:  Procedure Laterality Date   CARDIAC CT ANGIOGRAM  03/2019    Ca Score 0. Normal Coronary origin R dominant. No evidence of CAD. ? Liver nodules - poorly visualized (consider MRI w & w/o Gad contrast)   CERVICAL POLYPECTOMY  01/15/2018   Procedure: CERVICAL POLYPECTOMY;  Surgeon: Arvella Nigh, MD;  Location: [facility];  Service: Gynecology;;   COLONOSCOPY  2008   Normal   DIAGNOSTIC LAPAROSCOPY  08/1999   dermoid cyst, RSO   DILATION AND CURETTAGE OF UTERUS  05/2004   MAB   DILATION AND CURETTAGE OF UTERUS  04/2015   HYSTEROSCOPY WITH D & C  07/22/2011   Procedure: DILATATION AND CURETTAGE /HYSTEROSCOPY;  Surgeon: Cyril Mourning, MD;  Location: [facility];  Service: Gynecology;;   HYSTEROSCOPY WITH D & C N/A 01/15/2018   Procedure: DILATATION AND CURETTAGE [place] WITH MYOSURE;  Surgeon: Arvella Nigh, MD;  Location: [facility];  Service: Gynecology;  Laterality: N/A;   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  Suggestion of high cardiac output (possibly related to anemia thyrotoxicosis, Pregnancy, sepsis etc.) -- was in setting of symptomatic anemia - Hgb 5.7    There were no vitals filed for this visit.   Subjective Assessment - 07/16/22 2006     Currently in Pain? No/denies    Pain Score 0-No pain               Group Session:  S: I need to work on this area.   O: During today's OT group session, the patient participated in an educational segment about the importance of goal-setting and the application of the SMART framework to enhance daily life, particularly focusing on ADLs and iADLs. The session began with five open-ended pre-session questions that facilitated group discussion and introspection about their current relationship with goals. Following the  introduction and educational segment, participants engaged in brainstorming and group discussions to devise hypothetical SMART goals. The session concluded with five post-session questions to reinforce understanding and facilitate reflection. Throughout the session, there was a range of engagement levels noted among the participants.   A:  Patient demonstrated a high level of engagement throughout the session. They actively participated in discussions, sharing personal experiences related to goal setting and challenges faced. Patient was able to clearly articulate an understanding of the SMART framework and proposed personal SMART goals related to their own ADLs with minimal assistance. They expressed enthusiasm about applying what they learned to their daily routine and appeared motivated to make changes.   Patient was present during the session but displayed passive engagement. They listened to the discussions but contributed minimally when prompted. When asked about personal experiences or thoughts related to the topic, their responses were brief and lacked depth. It was challenging to ascertain their understanding or the potential application of the SMART framework in their life. Further individual assessment or one-on-one discussions might be beneficial to explore any barriers to engagement or understanding of the material presented.   P: Continue to attend PHP OT group sessions 5x week for 4 weeks to promote daily structure, social engagement, and opportunities to develop and utilize adaptive strategies to maximize functional performance in preparation for safe transition and integration back into school, work, and the community. Plan to address topic of pt 2 in next OT group session.                    OT Education - 07/16/22 2006     Education Details SMART Goals              OT Short Term Goals - 06/25/22 1025       OT SHORT TERM GOAL #1   Title Patient will be  educated on strategies to improve psychosocial skills needed to participate fully in all daily, work, and leisure activities.    Time 4    Period Weeks    Status On-going    Target Date 07/25/22      OT SHORT TERM GOAL #2   Title Pt will apply psychosocial skills and coping mechanisms to daily activities in order to function independently and reintegrate into community dwelling      OT [place] #3   Title Pt will recall and/or apply 1-3 sleep hygiene strategies to improve BADL routine upon reintegrating into community      OT SHORT TERM GOAL #4   Title Pt will choose and/or engage in 1-3 socially engaging leisure activities to improve social participation skills upon  reintegrating into community      OT SHORT TERM GOAL #5   Title Pt will engage in goal setting to improve functional BADL/IADL routine upon reintegrating into community                      Plan - 07/16/22 2008     Psychosocial Skills Coping Strategies;Habits;Interpersonal Interaction;Routines and Behaviors             Patient will benefit from skilled therapeutic intervention in order to improve the following deficits and impairments:       Psychosocial Skills: Coping Strategies, Habits, Interpersonal Interaction, Routines and Behaviors   Visit Diagnosis: Difficulty coping    Problem List Patient Active Problem List   Diagnosis Date Noted   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   Acute on chronic diastolic congestive heart failure ([place]) 73/53/2992   Acute diastolic heart failure ([place])    Pulmonary edema 08/07/2019   Sinus tachycardia 05/17/2019   Atypical chest pain 02/11/2019   Pulmonary hypertension, unspecified ([place]) 02/11/2019   Functional systolic murmur 42/68/3419   Inadequate sleep hygiene 12/08/2018   Psychophysiological insomnia 12/08/2018   Obesity hypoventilation syndrome ([place]) 12/08/2018   OSA (obstructive sleep apnea) 12/08/2018    Chronic migraine without aura without status migrainosus, not intractable 09/21/2018   Bipolar 1 disorder, depressed, severe ([place]) 09/08/2018   Endometrium, polyp 01/15/2018    Class: Present on Admission   Fibroids, submucosal 01/15/2018    Class: Present on Admission   Iron deficiency anemia due to chronic blood loss 09/16/2017   Menorrhagia with regular cycle    Symptomatic anemia 08/28/2017   Acute blood loss anemia 08/28/2017   Bipolar disorder ([place]) 08/28/2017   Anxiety 08/28/2017   Depression with anxiety 08/28/2017   Fibroids 08/28/2017   PCOS (polycystic ovarian syndrome) 08/28/2017   Fever 08/28/2017   HTN (hypertension) 08/28/2017   Morbid obesity ([place]) 12/04/2015   Pregnancy with history of uterine myomectomy 04/07/2015   History of right oophorectomy 02/21/2015   History of recurrent miscarriages 02/01/2015   Vitamin D deficiency 02/01/2015    Brantley Stage, OT 07/16/2022, 8:10 PM  [facility], [place], [place], [address] Phone: [phone]   Fax:  [phone]  Name: Leann Mayweather MRN: [id number] Date of Birth: 06-19-1976

## 2022-07-16 NOTE — Progress Notes (Signed)
Spoke with patient via WebEx video call, used 2 identifiers to correctly identify patient. States that groups are good. Has realized that she gets manic when she doesn't get enough sleep. Feels like her medications are helping and improving. Friday is her last day in [facility] and will be starting IOP next week. On scale 1-10 as 10 being worst she rates depression at 5 and anxiety at 3. Denies SI/HI or AV hallucinations. PHQ9=8. No side effects with medications. No issues or complaints.

## 2022-07-16 NOTE — Telephone Encounter (Signed)
D: Pt will step down to [facility] from [place] on 07-22-22.  A:  Oriented pt.  Pt gave the cm her updated email address.  Encouraged pt to verify her insurance benefits.  R:  Pt receptive.

## 2022-07-16 NOTE — Progress Notes (Signed)
Chief Complaint:   OBESITY Christine [name] is here to discuss her progress with her obesity treatment plan along with follow-up of her obesity related diagnoses. Christine [name] is on the Category 4 Plan +200 and states she is following her eating plan approximately 15% of the time. Christine [name] states she is exercising 0 minutes 0 times per week.  Today's visit was #: 3 Starting weight: 332 lbs Starting date: 05/09/2022 Today's weight: 327 lbs Today's date: 07/03/2022 Total lbs lost to date: 0 lbs Total lbs lost since last in-office visit: 6  Interim History: Christine [name] has really struggled with her bipolar disorder and feeling really depressed.  She has been eating more fast food and junk food.  These were alternating with days where she is not eating.  Started intense outpatient therapy sessions.  Not sure if she is able to really commit to program currently.  Subjective:   1. Other hyperlipidemia Last LDL of 125, HDL of 38, trig of 81,.  She is not on medications  Assessment/Plan:   1. Other hyperlipidemia Will repeat labs in 2 months.  2. Obesity with BMI of 56.2 Christine [name] is currently in the action stage of change. As such, her goal is to continue with weight loss efforts. She has agreed to the Category 4 Plan.   Exercise goals: No exercise has been prescribed at this time.  Behavioral modification strategies: increasing lean protein intake, increasing vegetables, meal planning and cooking strategies, keeping healthy foods in the home, and planning for success.  Christine [name] has agreed to follow-up with our clinic in 10 weeks. She was informed of the importance of frequent follow-up visits to maximize her success with intensive lifestyle modifications for her multiple health conditions.   Objective:   Blood pressure 111/75, pulse 88, temperature 98.5 F (36.9 C), height '5\' 4"'$  (1.626 m), weight (!) 327 lb (148.3 kg), SpO2 99 %. Body mass index is 56.13 kg/m.  General: Cooperative, alert, well developed,  in no acute distress. HEENT: Conjunctivae and lids unremarkable. Cardiovascular: Regular rhythm.  Lungs: Normal work of breathing. Neurologic: No focal deficits.   Lab Results  Component Value Date   CREATININE 0.95 05/02/2022   BUN 13 05/02/2022   NA 140 05/02/2022   K 3.5 05/02/2022   CL 111 05/02/2022   CO2 23 05/02/2022   Lab Results  Component Value Date   ALT 15 05/02/2022   AST 13 (L) 05/02/2022   ALKPHOS 51 05/02/2022   BILITOT 0.7 05/02/2022   Lab Results  Component Value Date   HGBA1C 5.7 05/02/2022   HGBA1C 5.7 (H) 10/07/2018   Lab Results  Component Value Date   INSULIN 26.8 (H) 05/09/2022   INSULIN 19.5 10/07/2018   Lab Results  Component Value Date   TSH 1.55 05/02/2022   Lab Results  Component Value Date   CHOL 178 05/09/2022   HDL 38 (L) 05/09/2022   LDLCALC 125 (H) 05/09/2022   TRIG 81 05/09/2022   Lab Results  Component Value Date   VD25OH 9.6 05/02/2022   VD25OH 7.0 (L) 10/07/2018   Lab Results  Component Value Date   WBC 11.2 (H) 05/23/2022   HGB 8.2 (L) 05/23/2022   HCT 28.7 (L) 05/23/2022   MCV 70.3 (L) 05/23/2022   PLT 435 (H) 05/23/2022   Lab Results  Component Value Date   IRON 24 (L) 05/23/2022   TIBC 466 (H) 05/23/2022   FERRITIN 4 (L) 05/23/2022   Attestation Statements:   Reviewed by clinician on day  of visit: allergies, medications, problem list, medical history, surgical history, family history, social history, and previous encounter notes.  I, Elnora Morrison, RMA am acting as transcriptionist for Coralie Common, MD.  I have reviewed the above documentation for accuracy and completeness, and I agree with the above. - Coralie Common, MD

## 2022-07-17 ENCOUNTER — Other Ambulatory Visit (HOSPITAL_COMMUNITY): Payer: Medicare Other | Admitting: Licensed Clinical Social Worker

## 2022-07-17 ENCOUNTER — Other Ambulatory Visit (HOSPITAL_COMMUNITY): Payer: Medicare Other

## 2022-07-17 DIAGNOSIS — F3162 Bipolar disorder, current episode mixed, moderate: Secondary | ICD-10-CM | POA: Diagnosis not present

## 2022-07-17 DIAGNOSIS — F411 Generalized anxiety disorder: Secondary | ICD-10-CM

## 2022-07-17 DIAGNOSIS — F314 Bipolar disorder, current episode depressed, severe, without psychotic features: Secondary | ICD-10-CM | POA: Diagnosis not present

## 2022-07-17 DIAGNOSIS — R4589 Other symptoms and signs involving emotional state: Secondary | ICD-10-CM

## 2022-07-18 ENCOUNTER — Encounter (HOSPITAL_COMMUNITY): Payer: Self-pay

## 2022-07-18 ENCOUNTER — Other Ambulatory Visit (HOSPITAL_COMMUNITY): Payer: Medicare Other

## 2022-07-18 ENCOUNTER — Other Ambulatory Visit (HOSPITAL_COMMUNITY): Payer: Medicare Other | Admitting: Professional

## 2022-07-18 DIAGNOSIS — F411 Generalized anxiety disorder: Secondary | ICD-10-CM

## 2022-07-18 DIAGNOSIS — F314 Bipolar disorder, current episode depressed, severe, without psychotic features: Secondary | ICD-10-CM | POA: Diagnosis not present

## 2022-07-18 DIAGNOSIS — R4589 Other symptoms and signs involving emotional state: Secondary | ICD-10-CM

## 2022-07-18 DIAGNOSIS — F3162 Bipolar disorder, current episode mixed, moderate: Secondary | ICD-10-CM | POA: Diagnosis not present

## 2022-07-18 NOTE — Therapy (Signed)
[place], [place], [address] Phone: [phone]   Fax:  [phone]  Occupational Therapy Treatment Virtual Visit via Video Note  I connected with Christine [name] on 07/18/22 at  8:00 AM EST by a video enabled telemedicine application and verified that I am speaking with the correct person using two identifiers.  Location: Patient: home Provider: office   I discussed the limitations of evaluation and management by telemedicine and the availability of in person appointments. The patient expressed understanding and agreed to proceed.    The patient was advised to call back or seek an in-person evaluation if the symptoms worsen or if the condition fails to improve as anticipated.  I provided 55 minutes of non-face-to-face time during this encounter.   Patient Details  Name: Christine [name] MRN: [id number] Date of Birth: 12/08/1975 No data recorded  Encounter Date: 07/17/2022   OT End of Session - 07/18/22 0852     Visit Number 14    Number of Visits 20    Date for OT Re-Evaluation 07/25/22    OT Start Time 1200    OT Stop Time 1255    OT Time Calculation (min) 55 min             Past Medical History:  Diagnosis Date   Anemia    Presumably from menorrhagia/menomenorrhagia; has had both blood and iron transfusions   Anxiety    Back pain    Bipolar 1 disorder ([place])    With depression   Chest pain    Depression    Diastolic heart failure ([place])    Essential hypertension    Fibromyalgia    Headache    History of blood transfusion 08/2017   WL   History of hiatal hernia    IBS (irritable bowel syndrome)    Infertility, female    Infertility, female    Lactose intolerance    Lower extremity edema    Migraines    Morbid obesity ([facility])    Osteoarthritis    Hands, ankles; HLA-B27 positive   PCOS (polycystic ovarian syndrome)    Prediabetes    Seasonal allergies    With recurrent  allergic rhinitis   Sleep apnea    SOB (shortness of breath)    Uterine leiomyoma    Vitamin D deficiency     Past Surgical History:  Procedure Laterality Date   CARDIAC CT ANGIOGRAM  03/2019    Ca Score 0. Normal Coronary origin R dominant. No evidence of CAD. ? Liver nodules - poorly visualized (consider MRI w & w/o Gad contrast)   CERVICAL POLYPECTOMY  01/15/2018   Procedure: CERVICAL POLYPECTOMY;  Surgeon: Arvella Nigh, MD;  Location: [facility];  Service: Gynecology;;   COLONOSCOPY  2008   Normal   DIAGNOSTIC LAPAROSCOPY  08/1999   dermoid cyst, RSO   DILATION AND CURETTAGE OF UTERUS  05/2004   MAB   DILATION AND CURETTAGE OF UTERUS  04/2015   HYSTEROSCOPY WITH D & C  07/22/2011   Procedure: DILATATION AND CURETTAGE /HYSTEROSCOPY;  Surgeon: Cyril Mourning, MD;  Location: [facility];  Service: Gynecology;;   HYSTEROSCOPY WITH D & C N/A 01/15/2018   Procedure: DILATATION AND CURETTAGE [place] WITH MYOSURE;  Surgeon: Arvella Nigh, MD;  Location: [facility];  Service: Gynecology;  Laterality: N/A;   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  Suggestion of high cardiac output (possibly related to anemia thyrotoxicosis, Pregnancy, sepsis etc.) -- was in setting of symptomatic anemia - Hgb 5.7    There were no vitals filed for this visit.   Subjective Assessment - 07/18/22 0852     Currently in Pain? No/denies    Pain Score 0-No pain                  Group Session:  S: Doing better today  O: During today's OT group session, the patient participated in an educational segment about the importance of goal-setting and the application of the SMART framework to enhance daily life, particularly focusing on ADLs and iADLs. The session began with five open-ended pre-session questions that facilitated group discussion and introspection about their current relationship with goals. Following the  introduction and educational segment, participants engaged in brainstorming and group discussions to devise hypothetical SMART goals. The session concluded with five post-session questions to reinforce understanding and facilitate reflection. Throughout the session, there was a range of engagement levels noted among the participants.   A:  Patient demonstrated a high level of engagement throughout the session. They actively participated in discussions, sharing personal experiences related to goal setting and challenges faced. Patient was able to clearly articulate an understanding of the SMART framework and proposed personal SMART goals related to their own ADLs with minimal assistance. They expressed enthusiasm about applying what they learned to their daily routine and appeared motivated to make changes.     P: Continue to attend PHP OT group sessions 5x week for 4 weeks to promote daily structure, social engagement, and opportunities to develop and utilize adaptive strategies to maximize functional performance in preparation for safe transition and integration back into school, work, and the community. Plan to address topic of SMART Goals cont'd in next OT group session.                 OT Education - 07/18/22 0852     Education Details SMART Goals              OT Short Term Goals - 06/25/22 1025       OT SHORT TERM GOAL #1   Title Patient will be educated on strategies to improve psychosocial skills needed to participate fully in all daily, work, and leisure activities.    Time 4    Period Weeks    Status On-going    Target Date 07/25/22      OT SHORT TERM GOAL #2   Title Pt will apply psychosocial skills and coping mechanisms to daily activities in order to function independently and reintegrate into community dwelling      OT [place] #3   Title Pt will recall and/or apply 1-3 sleep hygiene strategies to improve BADL routine upon reintegrating into  community      OT [place] #4   Title Pt will choose and/or engage in 1-3 socially engaging leisure activities to improve social participation skills upon reintegrating into community      OT [place] #5   Title Pt will engage in goal setting to improve functional BADL/IADL routine upon reintegrating into community                      Plan - 07/18/22 0852     Psychosocial Skills Coping Strategies;Habits;Interpersonal Interaction;Routines and Behaviors             Patient will  benefit from skilled therapeutic intervention in order to improve the following deficits and impairments:       Psychosocial Skills: Coping Strategies, Habits, Interpersonal Interaction, Routines and Behaviors   Visit Diagnosis: Difficulty coping    Problem List Patient Active Problem List   Diagnosis Date Noted   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   Acute on chronic diastolic congestive heart failure ([place]) 84/13/2440   Acute diastolic heart failure ([place])    Pulmonary edema 08/07/2019   Sinus tachycardia 05/17/2019   Atypical chest pain 02/11/2019   Pulmonary hypertension, unspecified ([place]) 02/11/2019   Functional systolic murmur 06/01/2535   Inadequate sleep hygiene 12/08/2018   Psychophysiological insomnia 12/08/2018   Obesity hypoventilation syndrome ([place]) 12/08/2018   OSA (obstructive sleep apnea) 12/08/2018   Chronic migraine without aura without status migrainosus, not intractable 09/21/2018   Bipolar 1 disorder, depressed, severe ([place]) 09/08/2018   Endometrium, polyp 01/15/2018    Class: Present on Admission   Fibroids, submucosal 01/15/2018    Class: Present on Admission   Iron deficiency anemia due to chronic blood loss 09/16/2017   Menorrhagia with regular cycle    Symptomatic anemia 08/28/2017   Acute blood loss anemia 08/28/2017   Bipolar disorder ([place]) 08/28/2017   Anxiety 08/28/2017   Depression with anxiety  08/28/2017   Fibroids 08/28/2017   PCOS (polycystic ovarian syndrome) 08/28/2017   Fever 08/28/2017   HTN (hypertension) 08/28/2017   Morbid obesity ([place]) 12/04/2015   Pregnancy with history of uterine myomectomy 04/07/2015   History of right oophorectomy 02/21/2015   History of recurrent miscarriages 02/01/2015   Vitamin D deficiency 02/01/2015    Brantley Stage, OT 07/18/2022, 8:53 AM  [facility], [place], [place], [address] Phone: [phone]   Fax:  [phone]  Name: Christine [name] MRN: [id number] Date of Birth: May 08, 1976

## 2022-07-18 NOTE — Progress Notes (Signed)
[facility] Partial Hospitalization Program Psych Discharge Summary   Virtual Visit via Video Note  I connected with Christine [name] on 07/18/22 at  9:00 AM EST by a video enabled telemedicine application and verified that I am speaking with the correct person using two identifiers.  Location: Patient: Home Provider: [facility]   I discussed the limitations of evaluation and management by telemedicine and the availability of in person appointments. The patient expressed understanding and agreed to proceed.  Christine [name] [id number]  Admission date: 06/24/2022 Discharge date: 07/19/2022  Reason for admission:  She reports that her mood has been down the last few months but specifically worsening about 2 to 3 weeks ago.  She reports thoughts of SI have started and been worsening.  She reports also having urges to self-harm but that she has not.  She reports that whenever this happens she thinks of her kids to dispel these thoughts.  She reports she met with her outpatient provider last week and did make med changes.  She reports that she has done the partial hospitalization program in the past and it was helpful and so since she was having these issues now she wanted to enroll in the program to prevent worsening.  She reports mostly having depressive symptoms- depression, feelings of hopelessness/worthlessness, decreased sleep, fatigue, and high anxiety.  She reports some manic symptoms of racing thoughts and irritable mood but that these are minor and vastly overshadowed by the depressive symptoms.   Progress in Program Toward Treatment Goals: Progressing  Progress (rationale):  Christine [name] is a 46 yr old female who presents via Virtual Video for follow up and for medication management, she enrolled in the [facility] Program 06/24/2022.  PPHx is significant for Bipolar Disorder and Anxiety, a history of Self Injurious Behavior (cutting- last 2020), and 4 Psychiatric Hospitalizations (last [facility]  09/2018), and no history of Suicide Attempts.    She reports that she is doing pretty good.  She reports that the program has been really helpful for her.  She reports no issues with the increase in her lithium.  She reports that when she wakes up in the morning she is a little low but improves as the day goes on.  She reports last night she slept 7 hours which is something she has not done in a very long time.  She reports her appetite is doing good.  Discussed with her that she would continue to build on the skills in the IOP program.  He reports no SI, HI, or AVH.  She reports no other concerns at present.    Psychiatric Specialty Exam:   Review of Systems  Respiratory:  Negative for shortness of breath.   Cardiovascular:  Negative for chest pain.  Gastrointestinal:  Negative for abdominal pain, constipation, diarrhea, nausea and vomiting.  Neurological:  Negative for dizziness, weakness and headaches.  Psychiatric/Behavioral:  Negative for dysphoric mood, hallucinations, sleep disturbance and suicidal ideas. The patient is not nervous/anxious.     There were no vitals taken for this visit.There is no height or weight on file to calculate BMI.  General Appearance: Casual and Fairly Groomed  Eye Contact:  Good  Speech:  Clear and Coherent and Normal Rate  Volume:  Normal  Mood:   "ok"  Affect:  Appropriate and Congruent  Thought Process:  Coherent and Goal Directed  Orientation:  Full (Time, Place, and Person)  Thought Content:  WDL and Logical  Suicidal Thoughts:  No  Homicidal Thoughts:  No  Memory:  Immediate;   Good Recent;   Good  Judgement:  Good  Insight:  Good  Psychomotor Activity:  Normal  Concentration:  Concentration: Good and Attention Span: Good  Recall:  Good  Fund of Knowledge:  Good  Language:  Good  Akathisia:  Negative  Handed:  Right  AIMS (if indicated):     Assets:  Communication Skills Desire for Improvement Financial  Resources/Insurance Housing Resilience Social Support  ADL's:  Intact  Cognition:  WNL  Sleep:   good     Discharge Plan: Referral to Behavioral Health Intensive Outpatient Program  Collaboration of Care: Other PHP Program and IOP Program  Patient/Guardian was advised Release of Information must be obtained prior to any record release in order to collaborate their care with an outside provider. Patient/Guardian was advised if they have not already done so to contact the registration department to sign all necessary forms in order for [place] to release information regarding their care.   Consent: Patient/Guardian gives verbal consent for treatment and assignment of benefits for services provided during this visit. Patient/Guardian expressed understanding and agreed to proceed.   [facility] 07/18/2022   Follow Up Instructions:    I discussed the assessment and treatment plan with the patient. The patient was provided an opportunity to ask questions and all were answered. The patient agreed with the plan and demonstrated an understanding of the instructions.   The patient was advised to call back or seek an in-person evaluation if the symptoms worsen or if the condition fails to improve as anticipated.  I provided 20 minutes of non-face-to-face time during this encounter.   [name], MD

## 2022-07-19 ENCOUNTER — Other Ambulatory Visit (HOSPITAL_COMMUNITY): Payer: Medicare Other

## 2022-07-19 ENCOUNTER — Other Ambulatory Visit (HOSPITAL_COMMUNITY): Payer: Medicare Other | Admitting: Professional

## 2022-07-19 ENCOUNTER — Encounter (HOSPITAL_COMMUNITY): Payer: Self-pay | Admitting: Psychiatry

## 2022-07-19 ENCOUNTER — Encounter (HOSPITAL_COMMUNITY): Payer: Self-pay

## 2022-07-19 DIAGNOSIS — R4589 Other symptoms and signs involving emotional state: Secondary | ICD-10-CM

## 2022-07-19 DIAGNOSIS — F314 Bipolar disorder, current episode depressed, severe, without psychotic features: Secondary | ICD-10-CM | POA: Diagnosis not present

## 2022-07-19 DIAGNOSIS — F3162 Bipolar disorder, current episode mixed, moderate: Secondary | ICD-10-CM | POA: Diagnosis not present

## 2022-07-19 NOTE — Therapy (Signed)
[place], [place], [address] Phone: [phone]   Fax:  [phone]  Occupational Therapy Treatment Virtual Visit via Video Note  I connected with Christine [name] on 07/19/22 at  8:00 AM EST by a video enabled telemedicine application and verified that I am speaking with the correct person using two identifiers.  Location: Patient: home Provider: office   I discussed the limitations of evaluation and management by telemedicine and the availability of in person appointments. The patient expressed understanding and agreed to proceed.    The patient was advised to call back or seek an in-person evaluation if the symptoms worsen or if the condition fails to improve as anticipated.  I provided 55 minutes of non-face-to-face time during this encounter.  Patient Details  Name: Christine [name] MRN: [id number] Date of Birth: [birth year] No data recorded  Encounter Date: 07/18/2022   OT End of Session - 07/19/22 0817     Visit Number 15    Number of Visits 20    Date for OT Re-Evaluation 07/25/22    OT Start Time 1200    OT Stop Time 1255    OT Time Calculation (min) 55 min             Past Medical History:  Diagnosis Date   Anemia    Presumably from menorrhagia/menomenorrhagia; has had both blood and iron transfusions   Anxiety    Back pain    Bipolar 1 disorder ([place])    With depression   Chest pain    Depression    Diastolic heart failure ([place])    Essential hypertension    Fibromyalgia    Headache    History of blood transfusion 08/2017   [facility]   History of hiatal hernia    IBS (irritable bowel syndrome)    Infertility, female    Infertility, female    Lactose intolerance    Lower extremity edema    Migraines    Morbid obesity (HCC)    Osteoarthritis    Hands, ankles; HLA-B27 positive   PCOS (polycystic ovarian syndrome)    Prediabetes    Seasonal allergies    With recurrent  allergic rhinitis   Sleep apnea    SOB (shortness of breath)    Uterine leiomyoma    Vitamin D deficiency     Past Surgical History:  Procedure Laterality Date   CARDIAC CT ANGIOGRAM  03/2019    Ca Score 0. Normal Coronary origin R dominant. No evidence of CAD. ? Liver nodules - poorly visualized (consider MRI w & w/o Gad contrast)   CERVICAL POLYPECTOMY  01/15/2018   Procedure: CERVICAL POLYPECTOMY;  Surgeon: Arvella Nigh, MD;  Location: [facility];  Service: Gynecology;;   COLONOSCOPY  2008   Normal   DIAGNOSTIC LAPAROSCOPY  08/1999   dermoid cyst, RSO   DILATION AND CURETTAGE OF UTERUS  05/2004   MAB   DILATION AND CURETTAGE OF UTERUS  04/2015   HYSTEROSCOPY WITH D & C  07/22/2011   Procedure: DILATATION AND CURETTAGE /HYSTEROSCOPY;  Surgeon: Cyril Mourning, MD;  Location: [facility];  Service: Gynecology;;   HYSTEROSCOPY WITH D & C N/A 01/15/2018   Procedure: DILATATION AND CURETTAGE [place] WITH MYOSURE;  Surgeon: Arvella Nigh, MD;  Location: [facility];  Service: Gynecology;  Laterality: N/A;   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  Suggestion of high cardiac output (possibly related to anemia thyrotoxicosis, Pregnancy, sepsis etc.) -- was in setting of symptomatic anemia - Hgb 5.7    There were no vitals filed for this visit.   Subjective Assessment - 07/19/22 0817     Currently in Pain? No/denies    Pain Score 0-No pain                Group Session:  S: Doing a bit better today  O: During today's OT group session, the patient participated in an educational segment about the importance of goal-setting and the application of the SMART framework to enhance daily life, particularly focusing on ADLs and iADLs. The session began with five open-ended pre-session questions that facilitated group discussion and introspection about their current relationship with goals. Following the  introduction and educational segment, participants engaged in brainstorming and group discussions to devise hypothetical SMART goals. The session concluded with five post-session questions to reinforce understanding and facilitate reflection. Throughout the session, there was a range of engagement levels noted among the participants.   A:  Patient demonstrated a high level of engagement throughout the session. They actively participated in discussions, sharing personal experiences related to goal setting and challenges faced. Patient was able to clearly articulate an understanding of the SMART framework and proposed personal SMART goals related to their own ADLs with minimal assistance. They expressed enthusiasm about applying what they learned to their daily routine and appeared motivated to make changes.    P: Continue to attend PHP OT group sessions 5x week for 4 weeks to promote daily structure, social engagement, and opportunities to develop and utilize adaptive strategies to maximize functional performance in preparation for safe transition and integration back into school, work, and the community. Plan to address topic of goals final in next OT group session.                   OT Education - 07/19/22 0817     Education Details SMART Goals              OT Short Term Goals - 06/25/22 1025       OT SHORT TERM GOAL #1   Title Patient will be educated on strategies to improve psychosocial skills needed to participate fully in all daily, work, and leisure activities.    Time 4    Period Weeks    Status On-going    Target Date 07/25/22      OT SHORT TERM GOAL #2   Title Pt will apply psychosocial skills and coping mechanisms to daily activities in order to function independently and reintegrate into community dwelling      OT [place] #3   Title Pt will recall and/or apply 1-3 sleep hygiene strategies to improve BADL routine upon reintegrating into community       OT [place] #4   Title Pt will choose and/or engage in 1-3 socially engaging leisure activities to improve social participation skills upon reintegrating into community      OT [place] #5   Title Pt will engage in goal setting to improve functional BADL/IADL routine upon reintegrating into community                      Plan - 07/19/22 0817     Psychosocial Skills Coping Strategies;Habits;Interpersonal Interaction;Routines and Behaviors             Patient will benefit  from skilled therapeutic intervention in order to improve the following deficits and impairments:       Psychosocial Skills: Coping Strategies, Habits, Interpersonal Interaction, Routines and Behaviors   Visit Diagnosis: Difficulty coping    Problem List Patient Active Problem List   Diagnosis Date Noted   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   Acute on chronic diastolic congestive heart failure ([place]) 61/60/7371   Acute diastolic heart failure ([place])    Pulmonary edema 08/07/2019   Sinus tachycardia 05/17/2019   Atypical chest pain 02/11/2019   Pulmonary hypertension, unspecified ([place]) 02/11/2019   Functional systolic murmur 01/29/9484   Inadequate sleep hygiene 12/08/2018   Psychophysiological insomnia 12/08/2018   Obesity hypoventilation syndrome ([place]) 12/08/2018   OSA (obstructive sleep apnea) 12/08/2018   Chronic migraine without aura without status migrainosus, not intractable 09/21/2018   Bipolar 1 disorder, depressed, severe ([place]) 09/08/2018   Endometrium, polyp 01/15/2018    Class: Present on Admission   Fibroids, submucosal 01/15/2018    Class: Present on Admission   Iron deficiency anemia due to chronic blood loss 09/16/2017   Menorrhagia with regular cycle    Symptomatic anemia 08/28/2017   Acute blood loss anemia 08/28/2017   Bipolar disorder ([place]) 08/28/2017   Anxiety 08/28/2017   Depression with anxiety 08/28/2017    Fibroids 08/28/2017   PCOS (polycystic ovarian syndrome) 08/28/2017   Fever 08/28/2017   HTN (hypertension) 08/28/2017   Morbid obesity ([place]) 12/04/2015   Pregnancy with history of uterine myomectomy 04/07/2015   History of right oophorectomy 02/21/2015   History of recurrent miscarriages 02/01/2015   Vitamin D deficiency 02/01/2015    Brantley Stage, OT 07/19/2022, 8:18 AM  [facility] [address] [place], [place], [address] Phone: [phone]   Fax:  [phone]  Name: Christine [name] MRN: [id number] Date of Birth: 10-Jun-1976

## 2022-07-22 ENCOUNTER — Other Ambulatory Visit (HOSPITAL_COMMUNITY): Payer: Medicare Other

## 2022-07-22 ENCOUNTER — Encounter (HOSPITAL_COMMUNITY): Payer: Self-pay | Admitting: Psychiatry

## 2022-07-22 ENCOUNTER — Encounter (HOSPITAL_COMMUNITY): Payer: Self-pay

## 2022-07-22 ENCOUNTER — Other Ambulatory Visit (HOSPITAL_COMMUNITY): Payer: Medicare Other | Attending: Licensed Clinical Social Worker | Admitting: Psychiatry

## 2022-07-22 DIAGNOSIS — F3162 Bipolar disorder, current episode mixed, moderate: Secondary | ICD-10-CM | POA: Diagnosis not present

## 2022-07-22 DIAGNOSIS — Z7984 Long term (current) use of oral hypoglycemic drugs: Secondary | ICD-10-CM | POA: Diagnosis not present

## 2022-07-22 DIAGNOSIS — K589 Irritable bowel syndrome without diarrhea: Secondary | ICD-10-CM | POA: Insufficient documentation

## 2022-07-22 DIAGNOSIS — D649 Anemia, unspecified: Secondary | ICD-10-CM | POA: Insufficient documentation

## 2022-07-22 DIAGNOSIS — E88819 Insulin resistance, unspecified: Secondary | ICD-10-CM | POA: Diagnosis not present

## 2022-07-22 DIAGNOSIS — I110 Hypertensive heart disease with heart failure: Secondary | ICD-10-CM | POA: Insufficient documentation

## 2022-07-22 DIAGNOSIS — Z90721 Acquired absence of ovaries, unilateral: Secondary | ICD-10-CM | POA: Insufficient documentation

## 2022-07-22 DIAGNOSIS — M1990 Unspecified osteoarthritis, unspecified site: Secondary | ICD-10-CM | POA: Diagnosis not present

## 2022-07-22 DIAGNOSIS — G4733 Obstructive sleep apnea (adult) (pediatric): Secondary | ICD-10-CM | POA: Insufficient documentation

## 2022-07-22 DIAGNOSIS — M797 Fibromyalgia: Secondary | ICD-10-CM | POA: Diagnosis not present

## 2022-07-22 DIAGNOSIS — E282 Polycystic ovarian syndrome: Secondary | ICD-10-CM | POA: Insufficient documentation

## 2022-07-22 DIAGNOSIS — E559 Vitamin D deficiency, unspecified: Secondary | ICD-10-CM | POA: Diagnosis not present

## 2022-07-22 DIAGNOSIS — F411 Generalized anxiety disorder: Secondary | ICD-10-CM | POA: Insufficient documentation

## 2022-07-22 DIAGNOSIS — I5032 Chronic diastolic (congestive) heart failure: Secondary | ICD-10-CM | POA: Insufficient documentation

## 2022-07-22 DIAGNOSIS — G43909 Migraine, unspecified, not intractable, without status migrainosus: Secondary | ICD-10-CM | POA: Diagnosis not present

## 2022-07-22 DIAGNOSIS — Z9152 Personal history of nonsuicidal self-harm: Secondary | ICD-10-CM | POA: Insufficient documentation

## 2022-07-22 DIAGNOSIS — Z79899 Other long term (current) drug therapy: Secondary | ICD-10-CM | POA: Diagnosis not present

## 2022-07-22 NOTE — Progress Notes (Signed)
Virtual Visit via Video Note  I connected with [name] on '@TODAY'$ @ at  9:00 AM EST by a video enabled telemedicine application and verified that I am speaking with the correct person using two identifiers.  Location: Patient: at home Provider: at office   I discussed the limitations of evaluation and management by telemedicine and the availability of in person appointments. The patient expressed understanding and agreed to proceed.  I discussed the assessment and treatment plan with the patient. The patient was provided an opportunity to ask questions and all were answered. The patient agreed with the plan and demonstrated an understanding of the instructions.   The patient was advised to call back or seek an in-person evaluation if the symptoms worsen or if the condition fails to improve as anticipated.  I provided 30 minutes of non-face-to-face time during this encounter.   Dellia Nims, M.Ed,CNA   Patient ID: Christine [name], female   DOB: [birth year], 46 y.o.   MRN: [id number] As per previous CCA states:  Pt reports to [facility] per self-referral. Pt has completed PHP in 2020 and found it helpful. Pt reports current stressors include: 1) Mental health: "I don't want to get to the point of making a plan to harm." Pt does not feel she is in a mixed-episode at this time which has led to impulsive SI in the past. 2) Grief: Pt's FiL died about 1 month ago. 3) Home: Tree fell through house in August and caused damage- everyone OK. 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 5) People: "I don't want to be around people. I've always liked being around people and now I don't." Treatment history includes extensive counseling, med management, PHP, and IOP. Pt does not have current therapist "but I've been trying for the last 6 months to get in with someone but things keep happening." Hospitalizations  include 4x- 05/2014, 11/2014, 04/2016 all in [place] and 09/2018 at [facility] all for SI, no attempts. Pt denies history of attempts. Protective factors and supports include husband, 2 teen daughters, 2 long-distance friends. Pt reports history of NSSIB by cutting, last time a few years ago- pt able to manage current thoughts with distractions at this time. Pt denies weapons in the home. Pt lives at home with husband and 2 teenage daughters. Family history includes mom with depression and anxiety. Medical diagnoses include High BP, Insulin Resistant; "some kind of heart like issue"; Anemic; Vitamin D deficiency. Pt denies HI/AVH, endorses SI without plan/intent.  Pt stepped down from PHP to MH-IOP today.  Reports that the previous groups were very helpful.  "I feel so much better than what I did coming into PHP, but I still have a ways to go."  Pt states she realized that she gets manic whenever she doesn't get enough sleep.  On a scale of 1-10 (10 being the worst); pt rates her depression at a 5 and anxiety at a 3.  Denies SI/HI or A/V hallucinations.  PHQ9=17. Pt is well known to this case manager d/t previous admits in [place].  Cc: previous notes. A:  Re-oriented pt.  Pt was advised of ROI must be obtained prior to any records release in order to collaborate her care with an outside provider.  Pt was advised if she has not already done so to contact the front desk to sign all necessary forms in order for MH-IOP  to release info re: her care. Consent:  Pt gives verbal consent for tx and assignment of benefits for services provided during this telehealth group process.  Pt expressed understanding and agreed to proceed. Collaboration of care:  Collaborate with Dr. Fatima Sanger AEB, Shade Flood, LCSW AEB, Julious Payer, NP AEB, at [facility].  Strongly encouraged support groups.   Pt will improve her mood as evidenced by being happy again, managing her mood and coping with daily stressors for 5 out of 7  days for 60 days. R:  Pt receptive.    Dellia Nims, M.Ed,CNA

## 2022-07-22 NOTE — Therapy (Signed)
[place], [place], [address] Phone: [phone]   Fax:  [phone]  Occupational Therapy Treatment Virtual Visit via Video Note  I connected with Christine [name] on 07/22/22 at  8:00 AM EST by a video enabled telemedicine application and verified that I am speaking with the correct person using two identifiers.  Location: Patient: home Provider: office   I discussed the limitations of evaluation and management by telemedicine and the availability of in person appointments. The patient expressed understanding and agreed to proceed.    The patient was advised to call back or seek an in-person evaluation if the symptoms worsen or if the condition fails to improve as anticipated.  I provided 55 minutes of non-face-to-face time during this encounter.   Patient Details  Name: Christine [name] MRN: [id number] Date of Birth: 1976-06-10 No data recorded  Encounter Date: 07/19/2022   OT End of Session - 07/22/22 1004     Visit Number 16    Number of Visits 20    Date for OT Re-Evaluation 07/25/22    OT Start Time 1200    OT Stop Time 1255    OT Time Calculation (min) 55 min             Past Medical History:  Diagnosis Date   Anemia    Presumably from menorrhagia/menomenorrhagia; has had both blood and iron transfusions   Anxiety    Back pain    Bipolar 1 disorder ([place])    With depression   Chest pain    Depression    Diastolic heart failure ([place])    Essential hypertension    Fibromyalgia    Headache    History of blood transfusion 08/2017   [facility]   History of hiatal hernia    IBS (irritable bowel syndrome)    Infertility, female    Infertility, female    Lactose intolerance    Lower extremity edema    Migraines    Morbid obesity ([facility])    Osteoarthritis    Hands, ankles; HLA-B27 positive   PCOS (polycystic ovarian syndrome)    Prediabetes    Seasonal allergies    With recurrent  allergic rhinitis   Sleep apnea    SOB (shortness of breath)    Uterine leiomyoma    Vitamin D deficiency     Past Surgical History:  Procedure Laterality Date   CARDIAC CT ANGIOGRAM  03/2019    Ca Score 0. Normal Coronary origin R dominant. No evidence of CAD. ? Liver nodules - poorly visualized (consider MRI w & w/o Gad contrast)   CERVICAL POLYPECTOMY  01/15/2018   Procedure: CERVICAL POLYPECTOMY;  Surgeon: Arvella Nigh, MD;  Location: [facility];  Service: Gynecology;;   COLONOSCOPY  2008   Normal   DIAGNOSTIC LAPAROSCOPY  08/1999   dermoid cyst, RSO   DILATION AND CURETTAGE OF UTERUS  05/2004   MAB   DILATION AND CURETTAGE OF UTERUS  04/2015   HYSTEROSCOPY WITH D & C  07/22/2011   Procedure: DILATATION AND CURETTAGE /HYSTEROSCOPY;  Surgeon: Cyril Mourning, MD;  Location: [facility];  Service: Gynecology;;   HYSTEROSCOPY WITH D & C N/A 01/15/2018   Procedure: DILATATION AND CURETTAGE [place] WITH MYOSURE;  Surgeon: Arvella Nigh, MD;  Location: [facility];  Service: Gynecology;  Laterality: N/A;   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  Suggestion of high cardiac output (possibly related to anemia thyrotoxicosis, Pregnancy, sepsis etc.) -- was in setting of symptomatic anemia - Hgb 5.7    There were no vitals filed for this visit.   Subjective Assessment - 07/22/22 1003     Currently in Pain? No/denies    Pain Score 0-No pain               Group Session:  S: Doing well today, I believe I learned a lot here and look forward to cont in IOP since today is my last day in PHP.   O: During today's OT group session, the patient participated in an educational segment about the importance of goal-setting and the application of the SMART framework to enhance daily life, particularly focusing on ADLs and iADLs. The session began with five open-ended pre-session questions that facilitated group  discussion and introspection about their current relationship with goals. Following the introduction and educational segment, participants engaged in brainstorming and group discussions to devise hypothetical SMART goals. The session concluded with five post-session questions to reinforce understanding and facilitate reflection. Throughout the session, there was a range of engagement levels noted among the participants.   A:  Patient demonstrated a high level of engagement throughout the session. They actively participated in discussions, sharing personal experiences related to goal setting and challenges faced. Patient was able to clearly articulate an understanding of the SMART framework and proposed personal SMART goals related to their own ADLs with minimal assistance. They expressed enthusiasm about applying what they learned to their daily routine and appeared motivated to make changes.  OCCUPATIONAL THERAPY DISCHARGE SUMMARY  Visits from Start of Care: 16  Current functional level related to goals / functional outcomes: Goals achieved and pt is ready for DC.    Remaining deficits: NA   Education / Equipment: Pt was provided w/ PDFs of all course material related to the topics discussed in PHP OT   Plan: Patient agrees to discharge.                        OT Education - 07/22/22 1004     Education Details SMART Goals              OT Short Term Goals - 07/22/22 1004       OT SHORT TERM GOAL #1   Status Achieved      OT SHORT TERM GOAL #2   Status Achieved      OT SHORT TERM GOAL #3   Status Achieved      OT SHORT TERM GOAL #4   Status Achieved      OT SHORT TERM GOAL #5   Status Achieved                      Plan - 07/22/22 1004     Psychosocial Skills Coping Strategies;Habits;Interpersonal Interaction;Routines and Behaviors             Patient will benefit from skilled therapeutic intervention in order to improve the  following deficits and impairments:       Psychosocial Skills: Coping Strategies, Habits, Interpersonal Interaction, Routines and Behaviors   Visit Diagnosis: Difficulty coping    Problem List Patient Active Problem List   Diagnosis Date Noted   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   Acute on chronic diastolic congestive heart failure ([place]) 47/65/4650   Acute diastolic heart failure ([place])  Pulmonary edema 08/07/2019   Sinus tachycardia 05/17/2019   Atypical chest pain 02/11/2019   Pulmonary hypertension, unspecified ([place]) 02/11/2019   Functional systolic murmur 53/61/4431   Inadequate sleep hygiene 12/08/2018   Psychophysiological insomnia 12/08/2018   Obesity hypoventilation syndrome ([place]) 12/08/2018   OSA (obstructive sleep apnea) 12/08/2018   Chronic migraine without aura without status migrainosus, not intractable 09/21/2018   Bipolar 1 disorder, depressed, severe ([place]) 09/08/2018   Endometrium, polyp 01/15/2018    Class: Present on Admission   Fibroids, submucosal 01/15/2018    Class: Present on Admission   Iron deficiency anemia due to chronic blood loss 09/16/2017   Menorrhagia with regular cycle    Symptomatic anemia 08/28/2017   Acute blood loss anemia 08/28/2017   Bipolar disorder ([place]) 08/28/2017   Anxiety 08/28/2017   Depression with anxiety 08/28/2017   Fibroids 08/28/2017   PCOS (polycystic ovarian syndrome) 08/28/2017   Fever 08/28/2017   HTN (hypertension) 08/28/2017   Morbid obesity ([place]) 12/04/2015   Pregnancy with history of uterine myomectomy 04/07/2015   History of right oophorectomy 02/21/2015   History of recurrent miscarriages 02/01/2015   Vitamin D deficiency 02/01/2015    Brantley Stage, OT 07/22/2022, 10:05 AM  [facility], [place], [place], [address] Phone: [phone]   Fax:  [phone]  Name: Christine [name] MRN: [id number] Date of Birth: Feb 12, 1976

## 2022-07-22 NOTE — Psych (Signed)
Virtual Visit via Video Note  I connected with [name] on 07/19/22 at  9:00 AM EST by a video enabled telemedicine application and verified that I am speaking with the correct person using two identifiers.  Location: Patient: patient home Provider: clinical home office   I discussed the limitations of evaluation and management by telemedicine and the availability of in person appointments. The patient expressed understanding and agreed to proceed.  I discussed the assessment and treatment plan with the patient. The patient was provided an opportunity to ask questions and all were answered. The patient agreed with the plan and demonstrated an understanding of the instructions.   The patient was advised to call back or seek an in-person evaluation if the symptoms worsen or if the condition fails to improve as anticipated.  Pt was provided 240 minutes of non-face-to-face time during this encounter.   [name], [facility] THERAPIST PROGRESS NOTE  Keymiah Lyles [id number]  Session Time: 9:00 - 10:00  Participation Level: Active  Behavioral Response: CasualAlertDepressed  Type of Therapy: Group Therapy  Treatment Goals addressed: Coping  Progress Towards Goals: Progressing  Interventions: CBT, DBT, Supportive, and Reframing  Summary: Clinician led check-in regarding current stressors and situation, and review of patient completed daily inventory. Clinician utilized active listening and empathetic response and validated patient emotions. Clinician facilitated processing group on pertinent issues.?   Therapist Response: Loula Marcella is a 46 y.o. female who presents with depression symptoms. Patient arrived within time allowed. Patient rates her mood at a 6.5 on a scale of 1-10 with 10 being best. Pt states she feels "ok." Pt reports she had a down afternoon when talking about Christmas with her husband. Pt reports she has had so many changes over the last few  years and is now worried about what Christmas looks like at home with her husband and 2 daughters. Patient able to process. Patient engaged in discussion.       Session Time: 10:00 am - 11:00 am   Participation Level: Active   Behavioral Response: CasualAlertDepressed   Type of Therapy: Group Therapy   Treatment Goals addressed: Coping   Progress Towards Goals: Progressing   Interventions: CBT, DBT, Solution Focused, Strength-based, Supportive, and Reframing   Summary: Clinician introduced "Mindfulness". Clinician showed TedTalk on mindfulness and the group discussed. Patients identified what types of activities would help them practice mindfulness.    Therapist Response:  Pt engaged in discussion.  Pt is able to identify activities she does on "autopilot" that will provide opportunities for pt to "check in" and be mindful.         Session Time: 11:00 -12:00   Participation Level: Active   Behavioral Response: CasualAlertDepressed   Type of Therapy: Group Therapy   Treatment Goals addressed: Coping   Progress Towards Goals: Progressing   Interventions: CBT, DBT, Solution Focused, Strength-based, Supportive, and Reframing   Summary: Clinician continued topic of mindfulness. Group discussed "What" and "How" skills of mindfulness. Patients identified what types of activities would help them practice mindfulness. Patients took part in Progressive Muscle Relaxation and other activities and discussed which would work best for them.    Therapist Response: Pt engaged in discussion and reports understanding. Pt identifies practicing 5-4-3-2-1 for timed mindfulness practice.       Session Time: 12:00 -1:00   Participation Level: Active   Behavioral Response: CasualAlertDepressed   Type of Therapy: Group therapy, Occupational Therapy   Treatment Goals addressed: Coping  Progress Towards Goals: Progressing   Interventions: Supportive; Psychoeducation   Summary: 12:00  - 12:50: Occupational Therapy group led by cln E. Hollan. 12:50 - 1:00 Clinician assessed for immediate needs, medication compliance and efficacy, and safety concerns.   Therapist Response: 12:00 - 12:50: See OT note 12:50 - 1:00 pm: At check-out, patient reports no immediate concerns. Patient demonstrates progress as evidenced by getting out of the house. Patient denies SI/HI/self-harm thoughts at the end of group.  Suicidal/Homicidal: Nowithout intent/plan  Plan: Pt will continue in PHP while working to decrease depression symptoms, increase emotion regulation, and increase ability to manage symptoms in a healthy manner.   Collaboration of Care: Medication Management AEB A Pashayan  Patient/Guardian was advised Release of Information must be obtained prior to any record release in order to collaborate their care with an outside provider. Patient/Guardian was advised if they have not already done so to contact the registration department to sign all necessary forms in order for [place] to release information regarding their care.   Consent: Patient/Guardian gives verbal consent for treatment and assignment of benefits for services provided during this visit. Patient/Guardian expressed understanding and agreed to proceed.   Diagnosis: Bipolar 1 disorder, depressed, severe ([place]) [F31.4]    1. Bipolar 1 disorder, depressed, severe ([place])     [name], [facility]

## 2022-07-22 NOTE — Psych (Signed)
Virtual Visit via Video Note  I connected with Christine [name] on 07/18/22 at  9:00 AM EST by a video enabled telemedicine application and verified that I am speaking with the correct person using two identifiers.  Location: Patient: patient home Provider: clinical home office   I discussed the limitations of evaluation and management by telemedicine and the availability of in person appointments. The patient expressed understanding and agreed to proceed.  I discussed the assessment and treatment plan with the patient. The patient was provided an opportunity to ask questions and all were answered. The patient agreed with the plan and demonstrated an understanding of the instructions.   The patient was advised to call back or seek an in-person evaluation if the symptoms worsen or if the condition fails to improve as anticipated.  Pt was provided 240 minutes of non-face-to-face time during this encounter.   [name], [name] PHP THERAPIST PROGRESS NOTE  Christine [name] [id number]  Session Time: 9:00 - 10:00  Participation Level: Active  Behavioral Response: CasualAlertDepressed  Type of Therapy: Group Therapy  Treatment Goals addressed: Coping  Progress Towards Goals: Progressing  Interventions: CBT, DBT, Supportive, and Reframing  Summary: Clinician led check-in regarding current stressors and situation, and review of patient completed daily inventory. Clinician utilized active listening and empathetic response and validated patient emotions. Clinician facilitated processing group on pertinent issues.?   Therapist Response: Christine [name] is a 46 y.o. female who presents with depression symptoms. Patient arrived within time allowed. Patient rates her mood at a 6 on a scale of 1-10 with 10 being best. Pt states she feels "pretty good." Pt reports she had a good afternoon speaking to a co-worker and building that bond. Pt reports mornings continue to be hard and she  feels more depressed in the morning. Patient able to process. Patient engaged in discussion.       Session Time: 10:00 am - 11:00 am   Participation Level: Active   Behavioral Response: CasualAlertDepressed   Type of Therapy: Group Therapy   Treatment Goals addressed: Coping   Progress Towards Goals: Progressing   Interventions: CBT, DBT, Solution Focused, Strength-based, Supportive, and Reframing   Summary: Cln led discussion on reframing cognitive distortions. Group spent time discussion some common cognitive distortions including magnification and minimization.     Therapist Response:  Pt engaged in discussion.  Pt is able to identify how to reframe and practice with group. Pt reports beating self up for "negatives" and will try to reframe her negative thoughts to focus on the positives.         Session Time: 11:00 -12:00   Participation Level: Active   Behavioral Response: CasualAlertDepressed   Type of Therapy: Group Therapy   Treatment Goals addressed: Coping   Progress Towards Goals: Progressing   Interventions: CBT, DBT, Solution Focused, Strength-based, Supportive, and Reframing   Summary: Clinician introduced topic of "Positive Psychology". Group watched "Positive Psychology" Ted-Talk. Patients discussed how their "lens" of life affects the way they feel. Group discussed 5 strategies to help change lens. Patients identified one strategy they would be willing to try to change their "lens" for at least 21 days to create a new habit.   Therapist Response: Pt engaged in discussion and reports understanding. Pt identifies with pushing goal post for success and happiness. Pt reports she will practice positive journaling each evening to help change her lens.       Session Time: 12:00 -1:00   Participation Level: Active  Behavioral Response: CasualAlertDepressed   Type of Therapy: Group therapy, Occupational Therapy   Treatment Goals addressed: Coping    Progress Towards Goals: Progressing   Interventions: Supportive; Psychoeducation   Summary: 12:00 - 12:50: Occupational Therapy group led by cln E. Hollan. 12:50 - 1:00 Clinician assessed for immediate needs, medication compliance and efficacy, and safety concerns.   Therapist Response: 12:00 - 12:50: See OT note 12:50 - 1:00 pm: At check-out, patient reports no immediate concerns. Patient demonstrates progress as evidenced by getting out of the house. Patient denies SI/HI/self-harm thoughts at the end of group.  Suicidal/Homicidal: Nowithout intent/plan  Plan: Pt will continue in PHP while working to decrease depression symptoms, increase emotion regulation, and increase ability to manage symptoms in a healthy manner.   Collaboration of Care: Medication Management AEB A Pashayan  Patient/Guardian was advised Release of Information must be obtained prior to any record release in order to collaborate their care with an outside provider. Patient/Guardian was advised if they have not already done so to contact the registration department to sign all necessary forms in order for [place] to release information regarding their care.   Consent: Patient/Guardian gives verbal consent for treatment and assignment of benefits for services provided during this visit. Patient/Guardian expressed understanding and agreed to proceed.   Diagnosis: Bipolar 1 disorder, mixed, moderate ([facility]) [F31.62]    1. Bipolar 1 disorder, mixed, moderate ([place])   2. Generalized anxiety disorder   3. Bipolar 1 disorder, depressed, severe ([place])     [name], [facility]

## 2022-07-22 NOTE — Progress Notes (Signed)
Virtual Visit via Video Note   I connected with Christine [name], who prefers to go by "Christine [name]" on 07/22/22 at  9:00 AM EDT by a video enabled telemedicine application and verified that I am speaking with the correct person using two identifiers.   At orientation to the IOP program, Case Manager discussed the limitations of evaluation and management by telemedicine and the availability of in person appointments. The patient expressed understanding and agreed to proceed with virtual visits throughout the duration of the program.   Location:  Patient: Patient Home Provider: Clinical Home Office   History of Present Illness: Bipolar I Disorder and GAD   Observations/Objective: Check In: Case Manager checked in with all participants to review discharge dates, insurance authorizations, work-related documents and needs from the treatment team regarding medications. Christine [name] stated needs and engaged in discussion.    Initial Therapeutic Activity: Counselor facilitated a check-in with Christine [name] to assess for safety, sobriety and medication compliance.  Counselor also inquired about Christine [name]'s current emotional ratings, as well as any significant changes in thoughts, feelings or behavior since previous check in.  Christine [name] presented for session on time and was alert, oriented x5, with no evidence or self-report of active SI/HI or A/V H.  Christine [name] reported compliance with medication and denied use of alcohol or illicit substances.  Christine [name] reported scores of 4/10 for depression, 4/10 for anxiety, and 0210 for irritability.  Christine [name] denied any recent outbursts or panic attacks.  Christine [name] reported that a recent struggle was falling into a depression 1 month ago, leading her to enter [facility] for help.  She reported that a success was choosing to continue treatment with [facility] in order to continue seeking stability.  She stated "I've made a lot of improvement".  Christine [name] reported that her goal today is to go to work for a few hours and  then take time to knit for self-care.       Second Therapeutic Activity: Counselor introduced topic of building a social support network today.  Counselor explained how this can be defined as having a having a group of healthy people in one's life you can talk to, spend time with, and get help from to improve both mental and physical health.  Counselor noted that some barriers can make it difficult to connect with other people, including the presence of anxiety or depression, or moving to an unfamiliar area.  Group members were asked to assess the current state of their support network, and identify ways that this could be improved.  Tips were given on how to address previously noted barriers, such as strengthening social skills, using relaxation techniques to reduce anxiety, scheduling social time each week, and/or exploring social events nearby which could increase chances of meeting new supports.  Members were also encouraged to consider getting closer to people they already know through suggestions such as outreaching someone by text, email or phone call if they haven't spoken in awhile, doing something nice for a friend/family member unexpectedly, and/or inviting someone over for a game/movie/dinner night.  Intervention effectiveness could not be measured, as Christine [name] lost power shortly after group reconvened after break, and she was unable to participate in second half of group as a result.    Assessment and Plan: Counselor recommends that [place] remain in IOP treatment to better manage mental health symptoms, ensure stability and pursue completion of treatment plan goals. Counselor recommends adherence to crisis/safety plan, taking medications as prescribed, and following up with medical professionals if any issues arise.  Follow Up Instructions: Counselor will send Webex link for next session. Christine [name] was advised to call back or seek an in-person evaluation if the symptoms worsen or if the condition  fails to improve as anticipated.   Collaboration of Care:   Medication Management AEB Dr. Fatima Sanger or Ricky Ala, NP                                          Case Manager AEB Dellia Nims, CNA   Patient/Guardian was advised Release of Information must be obtained prior to any record release in order to collaborate their care with an outside provider. Patient/Guardian was advised if they have not already done so to contact the registration department to sign all necessary forms in order for [place] to release information regarding their care.   Consent: Patient/Guardian gives verbal consent for treatment and assignment of benefits for services provided during this visit. Patient/Guardian expressed understanding and agreed to proceed.  I provided 100 minutes of non-face-to-face time during this encounter.   Shade Flood, [place], LCAS 07/22/22

## 2022-07-23 ENCOUNTER — Other Ambulatory Visit (HOSPITAL_COMMUNITY): Payer: Medicare Other

## 2022-07-23 ENCOUNTER — Other Ambulatory Visit (HOSPITAL_COMMUNITY): Payer: Medicare Other | Admitting: Licensed Clinical Social Worker

## 2022-07-23 DIAGNOSIS — Z9152 Personal history of nonsuicidal self-harm: Secondary | ICD-10-CM | POA: Diagnosis not present

## 2022-07-23 DIAGNOSIS — F411 Generalized anxiety disorder: Secondary | ICD-10-CM

## 2022-07-23 DIAGNOSIS — F3162 Bipolar disorder, current episode mixed, moderate: Secondary | ICD-10-CM

## 2022-07-23 DIAGNOSIS — E88819 Insulin resistance, unspecified: Secondary | ICD-10-CM | POA: Diagnosis not present

## 2022-07-23 DIAGNOSIS — G4733 Obstructive sleep apnea (adult) (pediatric): Secondary | ICD-10-CM | POA: Diagnosis not present

## 2022-07-23 DIAGNOSIS — I110 Hypertensive heart disease with heart failure: Secondary | ICD-10-CM | POA: Diagnosis not present

## 2022-07-23 NOTE — Psych (Signed)
Virtual Visit via Video Note  I connected with Christine [name] on 07/08/22 at  9:00 AM EST by a video enabled telemedicine application and verified that I am speaking with the correct person using two identifiers.  Location: Patient: patient home Provider: clinical home office   I discussed the limitations of evaluation and management by telemedicine and the availability of in person appointments. The patient expressed understanding and agreed to proceed.  I discussed the assessment and treatment plan with the patient. The patient was provided an opportunity to ask questions and all were answered. The patient agreed with the plan and demonstrated an understanding of the instructions.   The patient was advised to call back or seek an in-person evaluation if the symptoms worsen or if the condition fails to improve as anticipated.  Pt was provided 240 minutes of non-face-to-face time during this encounter.   [name], LCSW   [facility] PHP THERAPIST PROGRESS NOTE  Christine [name] [id number]  Session Time: 9:00 - 10:00  Participation Level: Active  Behavioral Response: CasualAlertDepressed  Type of Therapy: Group Therapy  Treatment Goals addressed: Coping  Progress Towards Goals: Progressing  Interventions: CBT, DBT, Supportive, and Reframing  Summary: Christine [name] is a 46 y.o. female who presents with depression symptoms.  Clinician led check-in regarding current stressors and situation, and review of patient completed daily inventory. Clinician utilized active listening and empathetic response and validated patient emotions. Clinician facilitated processing group on pertinent issues.?   Therapist Response: Patient arrived within time allowed. Patient rates her mood at a 3 on a scale of 1-10 with 10 being best. Pt states she feels "not great." Pt reports concern she was becoming manic over the weekend reporting heightened energy, impulsive thoughts, and pressured speech. Pt  presents with slightly faster speech today. Pt states sleeping 6 hours and eating 3x. Patient able to process. Patient engaged in discussion.       Session Time: 10:00 am - 11:00 am   Participation Level: Active   Behavioral Response: CasualAlertDepressed   Type of Therapy: Group Therapy   Treatment Goals addressed: Coping   Progress Towards Goals: Progressing   Interventions: CBT, DBT, Solution Focused, Strength-based, Supportive, and Reframing   Therapist Response: Cln led discussion on staying present. Cln highlighted CBT distorted thoughts of catastrophizing and fortune telling and encouraged pt's to think about today and tomorrow and not past that to address those distortions. Group members shared struggles they have with looking into the future.    Therapist Response:  Pt is able to process and discuss how to apply strategies discussed.          Session Time: 11:00 -12:00   Participation Level: Active   Behavioral Response: CasualAlertDepressed   Type of Therapy: Group Therapy   Treatment Goals addressed: Coping   Progress Towards Goals: Progressing   Interventions: CBT, DBT, Solution Focused, Strength-based, Supportive, and Reframing   Summary: Cln introduced topic of vulnerability. Group viewed TED talk "the power of vulnerability" to aid discussion. Group members shared the relationship they have with vulnerability and how it impacts their relationships.    Therapist Response:  Pt engaged in discussion and reports vulnerability is a struggle for them.          Session Time: 12:00 -1:00   Participation Level: Active   Behavioral Response: CasualAlertDepressed   Type of Therapy: Group therapy, Occupational Therapy   Treatment Goals addressed: Coping   Progress Towards Goals: Progressing   Interventions: Supportive; Psychoeducation   Summary:  12:00 - 12:50: Occupational Therapy group led by cln E. Hollan. 12:50 - 1:00 Clinician assessed for  immediate needs, medication compliance and efficacy, and safety concerns.   Therapist Response: 12:00 - 12:50: See OT note 12:50 - 1:00 pm: At check-out, patient reports no immediate concerns. Patient demonstrates progress as evidenced by continued engagement in group and responsiveness to treatment. Patient denies SI/HI/self-harm thoughts at the end of group.  Suicidal/Homicidal: Nowithout intent/plan  Plan: Pt will continue in PHP while working to decrease depression symptoms, increase emotion regulation, and increase ability to manage symptoms in a healthy manner.   Collaboration of Care: Medication Management AEB A Pashayan  Patient/Guardian was advised Release of Information must be obtained prior to any record release in order to collaborate their care with an outside provider. Patient/Guardian was advised if they have not already done so to contact the registration department to sign all necessary forms in order for [place] to release information regarding their care.   Consent: Patient/Guardian gives verbal consent for treatment and assignment of benefits for services provided during this visit. Patient/Guardian expressed understanding and agreed to proceed.   Diagnosis: Bipolar 1 disorder, depressed, severe ([place]) [F31.4]    1. Bipolar 1 disorder, depressed, severe ([place])   2. Generalized anxiety disorder     [name], LCSW

## 2022-07-23 NOTE — Psych (Signed)
Virtual Visit via Video Note  I connected with [name] on 07/05/22 at  9:00 AM EST by a video enabled telemedicine application and verified that I am speaking with the correct person using two identifiers.  Location: Patient: patient home Provider: clinical home office   I discussed the limitations of evaluation and management by telemedicine and the availability of in person appointments. The patient expressed understanding and agreed to proceed.  I discussed the assessment and treatment plan with the patient. The patient was provided an opportunity to ask questions and all were answered. The patient agreed with the plan and demonstrated an understanding of the instructions.   The patient was advised to call back or seek an in-person evaluation if the symptoms worsen or if the condition fails to improve as anticipated.  Pt was provided 240 minutes of non-face-to-face time during this encounter.   [name], LCSW   [facility] PHP THERAPIST PROGRESS NOTE  Marycatherine Maniscalco [id number]  Session Time: 9:00 - 10:00  Participation Level: Active  Behavioral Response: CasualAlertDepressed  Type of Therapy: Group Therapy  Treatment Goals addressed: Coping  Progress Towards Goals: Progressing  Interventions: CBT, DBT, Supportive, and Reframing  Summary: Emmarae Cowdery is a 46 y.o. female who presents with depression symptoms.  Clinician led check-in regarding current stressors and situation, and review of patient completed daily inventory. Clinician utilized active listening and empathetic response and validated patient emotions. Clinician facilitated processing group on pertinent issues.?   Therapist Response: Patient arrived within time allowed. Patient rates her mood at a 3 on a scale of 1-10 with 10 being best. Pt states she feels "not great." Pt reports crying for 30 minutes while facing completing a work task. Pt states she ignored her limits and noticed the detriment later.   Pt states sleeping 3 hours and eating 1x. Patient able to process. Patient engaged in discussion.       Session Time: 10:00 am - 11:00 am   Participation Level: Active   Behavioral Response: CasualAlertDepressed   Type of Therapy: Group Therapy   Treatment Goals addressed: Coping   Progress Towards Goals: Progressing   Interventions: CBT, DBT, Solution Focused, Strength-based, Supportive, and Reframing   Therapist Response: Cln led discussion on rest. Cln discussed the need to rewrite social story of rest being earned or last on the list and assert that rest is productive. Group members discussed barriers to allowing themselves rest and the negative self-talk involved.  Cln worked with group to thought challenge and apply self-coaching strategies.   Therapist Response:  Pt engaged in discussion and reports struggle with allowing themselves rest.          Session Time: 11:00 -12:00   Participation Level: Active   Behavioral Response: CasualAlertDepressed   Type of Therapy: Group Therapy   Treatment Goals addressed: Coping   Progress Towards Goals: Progressing   Interventions: CBT, DBT, Solution Focused, Strength-based, Supportive, and Reframing   Summary: Cln continued discussion on rest and introduced the nine types of rest: time away, permission to not be helpful, something unproductive, connection to art and nature, solitude to recharge, break from responsibility, stillness to decompress, safe space, alone time at home. Group shared ways in which they can utilize each type of rest and which ones are most problematic for them.    Therapist Response: Pt engaged in discussion and discusses which type of rest is most problematic.          Session Time: 12:00 -1:00   Participation  Level: Active   Behavioral Response: CasualAlertDepressed   Type of Therapy: Group therapy, Occupational Therapy   Treatment Goals addressed: Coping   Progress Towards Goals:  Progressing   Interventions: Supportive; Psychoeducation   Summary: 12:00 - 12:50: Occupational Therapy group led by cln E. Hollan. 12:50 - 1:00 Clinician assessed for immediate needs, medication compliance and efficacy, and safety concerns.   Therapist Response: 12:00 - 12:50: See OT note 12:50 - 1:00 pm: At check-out, patient reports no immediate concerns. Patient demonstrates progress as evidenced by continued engagement in group and responsiveness to treatment. Patient denies SI/HI/self-harm thoughts at the end of group.  Suicidal/Homicidal: Nowithout intent/plan  Plan: Pt will continue in PHP while working to decrease depression symptoms, increase emotion regulation, and increase ability to manage symptoms in a healthy manner.   Collaboration of Care: Medication Management AEB A Pashayan  Patient/Guardian was advised Release of Information must be obtained prior to any record release in order to collaborate their care with an outside provider. Patient/Guardian was advised if they have not already done so to contact the registration department to sign all necessary forms in order for [place] to release information regarding their care.   Consent: Patient/Guardian gives verbal consent for treatment and assignment of benefits for services provided during this visit. Patient/Guardian expressed understanding and agreed to proceed.   Diagnosis: Bipolar 1 disorder, depressed, severe ([place]) [F31.4]    1. Bipolar 1 disorder, depressed, severe ([place])     [name], LCSW

## 2022-07-23 NOTE — Psych (Signed)
Virtual Visit via Video Note  I connected with Christine [name] on 07/15/22 at  9:00 AM EST by a video enabled telemedicine application and verified that I am speaking with the correct person using two identifiers.  Location: Patient: patient home Provider: clinical home office   I discussed the limitations of evaluation and management by telemedicine and the availability of in person appointments. The patient expressed understanding and agreed to proceed.  I discussed the assessment and treatment plan with the patient. The patient was provided an opportunity to ask questions and all were answered. The patient agreed with the plan and demonstrated an understanding of the instructions.   The patient was advised to call back or seek an in-person evaluation if the symptoms worsen or if the condition fails to improve as anticipated.  Pt was provided 240 minutes of non-face-to-face time during this encounter.   [name], LCSW   [facility] PHP THERAPIST PROGRESS NOTE  Christine [name] [id number]  Session Time: 9:00 - 10:00  Participation Level: Active  Behavioral Response: CasualAlertDepressed  Type of Therapy: Group Therapy  Treatment Goals addressed: Coping  Progress Towards Goals: Progressing  Interventions: CBT, DBT, Supportive, and Reframing  Summary: Christine [name] is a 46 y.o. female who presents with depression symptoms.  Clinician led check-in regarding current stressors and situation, and review of patient completed daily inventory. Clinician utilized active listening and empathetic response and validated patient emotions. Clinician facilitated processing group on pertinent issues.?   Therapist Response: Patient arrived within time allowed. Patient rates her mood at a 4.5 on a scale of 1-10 with 10 being best. Pt states she feels "anxious." Pt reports increased irritability over the weekend and again being worried about mania and does not present with anything observable  today.  Pt states sleeping 3.5 hours and eating 2x. Pt reports struggling with self care. Patient able to process. Patient engaged in discussion.       Session Time: 10:00 am - 11:00 am   Participation Level: Active   Behavioral Response: CasualAlertDepressed   Type of Therapy: Group Therapy   Treatment Goals addressed: Coping   Progress Towards Goals: Progressing   Interventions: CBT, DBT, Solution Focused, Strength-based, Supportive, and Reframing   Therapist Response: Cln led processing group for pt's current struggles. Group members shared stressors and provided support and feedback. Cln brought in topics of boundaries, healthy relationships, and unhealthy thought processes to inform discussion.    Therapist Response: Pt able to process and provide support to group.          Session Time: 11:00 -12:00   Participation Level: Active   Behavioral Response: CasualAlertDepressed   Type of Therapy: Group Therapy   Treatment Goals addressed: Coping   Progress Towards Goals: Progressing   Interventions: CBT, DBT, Solution Focused, Strength-based, Supportive, and Reframing   Summary: Cln introduced DBT concept of radical acceptance. Cln discussed radical acceptance as a strategy to decrease distress and to manage situations outside of their control. Group volunteered struggles they are experiencing with control and cln helped group apply radical acceptance.    Therapist Response: Pt engaged in discussion and identified ways they can apply radical acceptance in their life.          Session Time: 12:00 -1:00   Participation Level: Active   Behavioral Response: CasualAlertDepressed   Type of Therapy: Group therapy, Occupational Therapy   Treatment Goals addressed: Coping   Progress Towards Goals: Progressing   Interventions: Supportive; Psychoeducation   Summary: 12:00 - 12:50:  Occupational Therapy group led by cln E. Hollan. 12:50 - 1:00 Clinician assessed for  immediate needs, medication compliance and efficacy, and safety concerns.   Therapist Response: 12:00 - 12:50: See OT note 12:50 - 1:00 pm: At check-out, patient reports no immediate concerns. Patient demonstrates progress as evidenced by continued engagement in group and responsiveness to treatment. Patient denies SI/HI/self-harm thoughts at the end of group.  Suicidal/Homicidal: Nowithout intent/plan  Plan: Pt will continue in PHP while working to decrease depression symptoms, increase emotion regulation, and increase ability to manage symptoms in a healthy manner.   Collaboration of Care: Medication Management AEB A Pashayan  Patient/Guardian was advised Release of Information must be obtained prior to any record release in order to collaborate their care with an outside provider. Patient/Guardian was advised if they have not already done so to contact the registration department to sign all necessary forms in order for [place] to release information regarding their care.   Consent: Patient/Guardian gives verbal consent for treatment and assignment of benefits for services provided during this visit. Patient/Guardian expressed understanding and agreed to proceed.   Diagnosis: Bipolar 1 disorder, depressed, severe ([place]) [F31.4]    1. Bipolar 1 disorder, depressed, severe ([place])   2. Generalized anxiety disorder     [name], LCSW
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
## 2022-07-23 NOTE — Psych (Signed)
Virtual Visit via Video Note  I connected with Christine [name] on 07/12/22 at  9:00 AM EST by a video enabled telemedicine application and verified that I am speaking with the correct person using two identifiers.  Location: Patient: patient home Provider: clinical home office   I discussed the limitations of evaluation and management by telemedicine and the availability of in person appointments. The patient expressed understanding and agreed to proceed.  I discussed the assessment and treatment plan with the patient. The patient was provided an opportunity to ask questions and all were answered. The patient agreed with the plan and demonstrated an understanding of the instructions.   The patient was advised to call back or seek an in-person evaluation if the symptoms worsen or if the condition fails to improve as anticipated.  Pt was provided 240 minutes of non-face-to-face time during this encounter.   [name], LCSW   [facility] PHP THERAPIST PROGRESS NOTE  Christine [name] [id number]  Session Time: 9:00 - 10:00  Participation Level: Active  Behavioral Response: CasualAlertDepressed  Type of Therapy: Group Therapy  Treatment Goals addressed: Coping  Progress Towards Goals: Progressing  Interventions: CBT, DBT, Supportive, and Reframing  Summary: Christine [name] is a 46 y.o. female who presents with depression symptoms.  Clinician led check-in regarding current stressors and situation, and review of patient completed daily inventory. Clinician utilized active listening and empathetic response and validated patient emotions. Clinician facilitated processing group on pertinent issues.?   Therapist Response: Patient arrived within time allowed. Patient rates her mood at a 3 on a scale of 1-10 with 10 being best. Pt states she feels "grumpy." Pt reports taking a nap yesterday instead of being "productive" and having negative self talk. Pt states sleeping 5 hours and eating 2x.  Patient able to process. Patient engaged in discussion.       Session Time: 10:00 am - 11:00 am   Participation Level: Active   Behavioral Response: CasualAlertDepressed   Type of Therapy: Group Therapy   Treatment Goals addressed: Coping   Progress Towards Goals: Progressing   Interventions: CBT, DBT, Solution Focused, Strength-based, Supportive, and Reframing   Therapist Response: Cln introduced DBT interpersonal effectiveness skill for self-respect, FAST. Cln led discussion on barriers to utilizing this skill, how it could be helpful, and situations in which they could have utilized it.    Therapist Response: Pt engaged in discussion and reports willingness to utilize FAST.          Session Time: 11:00 -12:00   Participation Level: Active   Behavioral Response: CasualAlertDepressed   Type of Therapy: Group Therapy   Treatment Goals addressed: Coping   Progress Towards Goals: Progressing   Interventions: CBT, DBT, Solution Focused, Strength-based, Supportive, and Reframing   Summary: Cln continued topic of DBT distress tolerance skills. Cln introduced Self-Soothe skills. Group discussed ways they can utilize the five senses to soothe themselves when struggling.    Therapist Response:  Pt engaged in discussion and shares ways she can apply skills.          Session Time: 12:00 -1:00   Participation Level: Active   Behavioral Response: CasualAlertDepressed   Type of Therapy: Group therapy, Occupational Therapy   Treatment Goals addressed: Coping   Progress Towards Goals: Progressing   Interventions: Supportive; Psychoeducation   Summary: 12:00 - 12:50: Occupational Therapy group led by cln E. Hollan. 12:50 - 1:00 Clinician assessed for immediate needs, medication compliance and efficacy, and safety concerns.   Therapist Response: 12:00 -  12:50: See OT note 12:50 - 1:00 pm: At check-out, patient reports no immediate concerns. Patient demonstrates  progress as evidenced by continued engagement in group and responsiveness to treatment. Patient denies SI/HI/self-harm thoughts at the end of group.  Suicidal/Homicidal: Nowithout intent/plan  Plan: Pt will continue in PHP while working to decrease depression symptoms, increase emotion regulation, and increase ability to manage symptoms in a healthy manner.   Collaboration of Care: Medication Management AEB A [place]  Patient/Guardian was advised Release of Information must be obtained prior to any record release in order to collaborate their care with an outside provider. Patient/Guardian was advised if they have not already done so to contact the registration department to sign all necessary forms in order for [place] to release information regarding their care.   Consent: Patient/Guardian gives verbal consent for treatment and assignment of benefits for services provided during this visit. Patient/Guardian expressed understanding and agreed to proceed.   Diagnosis: Bipolar 1 disorder, depressed, severe ([place]) [F31.4]    1. Bipolar 1 disorder, depressed, severe ([place])   2. Generalized anxiety disorder     [name], LCSW

## 2022-07-23 NOTE — Progress Notes (Signed)
IOP Initial Adult Assessment    Virtual Visit via Video Note  I connected with Christine [name] on 07/23/22 at  9:00 AM EST by a video enabled telemedicine application and verified that I am speaking with the correct person using two identifiers.  Location: Patient: Home Provider: [facility]   I discussed the limitations of evaluation and management by telemedicine and the availability of in person appointments. The patient expressed understanding and agreed to proceed.   Patient Identification: Christine [name] MRN:  [id number] Date of Evaluation:  07/23/2022 Referral Source: PHP Program Chief Complaint:   Chief Complaint  Patient presents with   Establish Care   Depression   Anxiety   Visit Diagnosis:    ICD-10-CM   1. Bipolar 1 disorder, mixed, moderate (HCC)  F31.62     2. Generalized anxiety disorder  F41.1       History of Present Illness:   Christine [name] is a 46 yr old female who presents via Virtual Video to Establish Care and for medication management, she enrolled in the IOP Program 07/22/2022.  PPHx is significant for Bipolar Disorder and Anxiety, a history of Self Injurious Behavior (cutting- last 2020), and 4 Psychiatric Hospitalizations (last [facility] 09/2018), and no history of Suicide Attempts.   From PHP H&P 06/24/2022 "She reports that her mood has been down the last few months but specifically worsening about 2 to 3 weeks ago.  She reports thoughts of SI have started and been worsening.  She reports also having urges to self-harm but that she has not.  She reports that whenever this happens she thinks of her kids to dispel these thoughts.  She reports she met with her outpatient provider last week and did make med changes.  She reports that she has done the partial hospitalization program in the past and it was helpful and so since she was having these issues now she wanted to enroll in the program to prevent worsening.  She reports mostly having depressive  symptoms- depression, feelings of hopelessness/worthlessness, decreased sleep, fatigue, and high anxiety.  She reports some manic symptoms of racing thoughts and irritable mood but that these are minor and vastly overshadowed by the depressive symptoms.   She reports past psychiatric history significant for bipolar disorder and anxiety.  She reports history of self-injurious behavior-cutting (last 2020).  She reports no history of suicide attempts.  She reports 4 prior psychiatric hospitalizations the latest being [facility] 09/2018.  She reports she currently is seeing at [facility] and is working on getting a therapist.  She reports she is currently on Caplyta, lithium, Lamictal, and Klonopin.  She reports past medical history significant for hypertension, anemia, vitamin D deficiency, migraines, OSA, and insulin resistance.  She reports no history of head trauma or seizures.  She reports allergy to penicillin-rash.   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  She reports she has been on disability since November 2019.  She reports currently lives in a house with her husband and 2 daughters (14, 5).  She reports no alcohol use.  She reports no tobacco use.  She reports no substance use.  She reports no current legal issues.  She reports no access to firearms.   She reports that she has noticed some improvement since being started on the [employer].  She reports that her lithium level has always been low so she would be willing to have this increased.  She reports she had a lithium  level lab done last week.  Asked her to bring a copy of this result as an adjustment would not be able to be made until this could be reviewed.  She was agreeable to this and had no other concerns at present.  Encouraged her to fully engage and participate in the [facility] program.  She reports no SI today, HI, or AVH.  She reports her sleep is fair.  She reports her appetite is fair.  She reports no other  concerns at present."  She reports that she has continued to do well since her discharge last week from the [facility] program.  She reports no recurrence of manic symptoms.  She reports her depressive and anxiety symptoms have continued to improve.  She reports no side effects from the increase in the lithium dose that was made during the [facility] program.  She reports her mood continues to remain stable.  Discussed with her that we would not make any changes to her medications at this time and she was agreeable with this.  Encouraged her to fully engage in the IOP program as she did in the [facility] program.  She reports her sleep is good.  She reports her appetite is fair.  She reports no SI, HI, or AVH.  She reports no other concerns at present.   Associated Signs/Symptoms: Depression Symptoms:   Reports Improving (Hypo) Manic Symptoms:   Reports None at present Anxiety Symptoms:   Reports improving  Psychotic Symptoms:   Reports None PTSD Symptoms: Negative  Past Psychiatric History: Bipolar Disorder and Anxiety, a history of Self Injurious Behavior (cutting- last 2020), and 4 Psychiatric Hospitalizations (last [facility] 09/2018), and no history of Suicide Attempts.   Previous Psychotropic Medications: Yes  Caplyta, lithium, Lamictal, Klonopin, Vraylar, Abilify, Risperdal, Seroquel, Depakote, Wellbutrin.   Substance Abuse History in the last 12 months:  No.  Consequences of Substance Abuse: NA  Past Medical History:  Past Medical History:  Diagnosis Date   Anemia    Presumably from menorrhagia/menomenorrhagia; has had both blood and iron transfusions   Anxiety    Back pain    Bipolar 1 disorder ([place])    With depression   Chest pain    Depression    Diastolic heart failure ([place])    Essential hypertension    Fibromyalgia    Headache    History of blood transfusion 08/2017   [facility]   History of hiatal hernia    IBS (irritable bowel syndrome)    Infertility, female    Infertility, female    Lactose  intolerance    Lower extremity edema    Migraines    Morbid obesity (HCC)    Osteoarthritis    Hands, ankles; HLA-B27 positive   PCOS (polycystic ovarian syndrome)    Prediabetes    Seasonal allergies    With recurrent allergic rhinitis   Sleep apnea    SOB (shortness of breath)    Uterine leiomyoma    Vitamin D deficiency     Past Surgical History:  Procedure Laterality Date   CARDIAC CT ANGIOGRAM  03/2019    Ca Score 0. Normal Coronary origin R dominant. No evidence of CAD. ? Liver nodules - poorly visualized (consider MRI w & w/o Gad contrast)   CERVICAL POLYPECTOMY  01/15/2018   Procedure: CERVICAL POLYPECTOMY;  Surgeon: Arvella Nigh, MD;  Location: [facility];  Service: Gynecology;;   COLONOSCOPY  2008   Normal   DIAGNOSTIC LAPAROSCOPY  08/1999   dermoid cyst, RSO  DILATION AND CURETTAGE OF UTERUS  05/2004   MAB   DILATION AND CURETTAGE OF UTERUS  04/2015   HYSTEROSCOPY WITH D & C  07/22/2011   Procedure: DILATATION AND CURETTAGE /HYSTEROSCOPY;  Surgeon: Cyril Mourning, MD;  Location: [facility];  Service: Gynecology;;   HYSTEROSCOPY WITH D & C N/A 01/15/2018   Procedure: DILATATION AND CURETTAGE [place] WITH MYOSURE;  Surgeon: Arvella Nigh, MD;  Location: [facility];  Service: Gynecology;  Laterality: N/A;   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  Suggestion of high cardiac output (possibly related to anemia thyrotoxicosis, Pregnancy, sepsis etc.) -- was in setting of symptomatic anemia - Hgb 5.7    Family Psychiatric History: Mother- Depression, Anxiety No Known history of Substance Abuse or Suicides.  Family History:  Family History  Problem Relation Age of Onset   Anxiety disorder Mother    Depression Mother    Cancer Mother        pancreatic    High blood pressure Mother    Hypertension Mother    Hyperlipidemia Mother    Cancer Father        colon   High blood pressure  Father    Diabetes Father    Hypertension Father    Hyperlipidemia Father    Heart disease Father    Stroke Father    Kidney disease Father    Sleep apnea Father    Obesity Father    Cancer Brother 6       leukemia, childhood   High blood pressure Brother    Migraines Neg Hx     Social History:   Social History   Socioeconomic History   Marital status: Married    Spouse name: Dainelle Hun   Number of children: 2   Years of education: 2 years of grad school + bach & HS   Highest education level: Bachelor's degree (e.g., BA, AB, BS)  Occupational History   Occupation: disabled   Occupation: Part time Naval architect  Tobacco Use   Smoking status: Never   Smokeless tobacco: Never  Vaping Use   Vaping Use: Never used  Substance and Sexual Activity   Alcohol use: Yes    Comment: occasional, maybe once a month or so   Drug use: No   Sexual activity: Yes    Partners: Male    Birth control/protection: None  Other Topics Concern   Not on file  Social History Narrative   ** Merged History Encounter **       Lives at home with her husband and 2 adopted daughters. Right handed Caffeine: decreased intake, drinks rarely  Is on long-term disability.   Social Determinants of Health   Financial Resource Strain: Medium Risk (09/10/2017)   Overall Financial Resource Strain (CARDIA)    Difficulty of Paying Living Expenses: Somewhat hard  Food Insecurity: Food Insecurity Present (09/10/2017)   Hunger Vital Sign    Worried About Running Out of Food in the Last Year: Sometimes true    Ran Out of Food in the Last Year: Sometimes true  Transportation Needs: No Transportation Needs (09/10/2017)   PRAPARE - Hydrologist (Medical): No    Lack of Transportation (Non-Medical): No  Physical Activity: Unknown (09/10/2017)   Exercise Vital Sign    Days of Exercise per Week: 0 days    Minutes of Exercise per Session:  Not on file  Stress: Stress Concern Present  (09/10/2017)   [place]    Feeling of Stress : Rather much  Social Connections: Unknown (09/10/2017)   Social Connection and Isolation Panel [NHANES]    Frequency of Communication with Friends and Family: Never    Frequency of Social Gatherings with Friends and Family: Never    Attends Religious Services: Never    Marine scientist or Organizations: No    Attends Archivist Meetings: Never    Marital Status: Not on file    Additional Social History: None  Allergies:   Allergies  Allergen Reactions   Penicillins Rash and Other (See Comments)    Has patient had a PCN reaction causing immediate rash, facial/tongue/throat swelling, SOB or lightheadedness with hypotension: Yes Has patient had a PCN reaction causing severe rash involving mucus membranes or skin necrosis: Unknown Has patient had a PCN reaction that required hospitalization: No Has patient had a PCN reaction occurring within the last 10 years: No If all of the above answers are "NO", then may proceed with Cephalosporin use.     Metabolic Disorder Labs: Lab Results  Component Value Date   HGBA1C 5.7 05/02/2022   No results found for: "PROLACTIN" Lab Results  Component Value Date   CHOL 178 05/09/2022   TRIG 81 05/09/2022   HDL 38 (L) 05/09/2022   LDLCALC 125 (H) 05/09/2022   LDLCALC 131 (H) 10/07/2018   Lab Results  Component Value Date   TSH 1.55 05/02/2022    Therapeutic Level Labs: Lab Results  Component Value Date   LITHIUM 0.12 (L) 08/07/2019   No results found for: "CBMZ" No results found for: "VALPROATE"  Current Medications: Current Outpatient Medications  Medication Sig Dispense Refill   acetaminophen (TYLENOL) 325 MG tablet Take 650 mg by mouth every 6 (six) hours as needed for mild pain or headache.     amLODipine (NORVASC) 5 MG tablet Take 1 tablet (5 mg total) by mouth daily. 30 tablet 0   CAPLYTA 42 MG  capsule Take 42 mg by mouth daily.     chlorthalidone (HYGROTON) 25 MG tablet TAKE 1/2 TABLET(12.5 MG) BY MOUTH DAILY 45 tablet 0   clonazePAM (KLONOPIN) 0.5 MG tablet Take 1 tablet (0.5 mg total) by mouth daily as needed for anxiety. 30 tablet 0   fluticasone (FLONASE) 50 MCG/ACT nasal spray 1 spray in each nostril     gabapentin (NEURONTIN) 100 MG capsule Take 100 mg by mouth 3 (three) times daily.     lamoTRIgine (LAMICTAL) 150 MG tablet Take 1 tablet (150 mg total) by mouth 2 (two) times daily. For mood 60 tablet 2   lithium carbonate (LITHOBID) 300 MG CR tablet Take 2 tablets (600 mg total) by mouth 2 (two) times daily. Take 2 tablets (600 mg) every morning and 1 tablet (300 mg) at night (Patient taking differently: Take 600 mg by mouth 2 (two) times daily. Take 2 tablets (600 mg) every morning and 1 tablet (600 mg) at night) 90 tablet 0   metFORMIN (GLUCOPHAGE) 500 MG tablet Take 1 tablet (500 mg total) by mouth 2 (two) times daily with a meal. 60 tablet 0   metoprolol succinate (TOPROL-XL) 100 MG 24 hr tablet TAKE 1 TABLET BY MOUTH DAILY WITH OR IMMEDIATELY FOLLOWING A MEAL 90 tablet 1   Vitamin D, Ergocalciferol, (DRISDOL) 1.25 MG (50000 UNIT) CAPS capsule Take 1 capsule (50,000 Units total) by mouth every  7 (seven) days. 5 capsule    No current facility-administered medications for this visit.    Musculoskeletal: Strength & Muscle Tone: within normal limits Gait & Station:  sitting during interview Patient leans: N/A  Psychiatric Specialty Exam: Review of Systems  Respiratory:  Negative for shortness of breath.   Cardiovascular:  Negative for chest pain.  Gastrointestinal:  Negative for abdominal pain, constipation, diarrhea, nausea and vomiting.  Neurological:  Negative for dizziness, weakness and headaches.  Psychiatric/Behavioral:  Negative for dysphoric mood, hallucinations, sleep disturbance and suicidal ideas. The patient is not nervous/anxious.     There were no vitals  taken for this visit.There is no height or weight on file to calculate BMI.  General Appearance: Casual and Fairly Groomed  Eye Contact:  Good  Speech:  Clear and Coherent and Normal Rate  Volume:  Normal  Mood:   "ok"  Affect:  Appropriate and Congruent  Thought Process:  Coherent and Goal Directed  Orientation:  Full (Time, Place, and Person)  Thought Content:  WDL and Logical  Suicidal Thoughts:  No  Homicidal Thoughts:  No  Memory:  Immediate;   Good Recent;   Good  Judgement:  Good  Insight:  Good  Psychomotor Activity:  Normal  Concentration:  Concentration: Good and Attention Span: Good  Recall:  Good  Fund of Knowledge:Good  Language: Good  Akathisia:  Negative  Handed:  Right  AIMS (if indicated):  not done  Assets:  Communication Skills Desire for Improvement Financial Resources/Insurance Housing Resilience Social Support  ADL's:  Intact  Cognition: WNL  Sleep:  Good   Screenings: AIMS    Flowsheet Row Admission (Discharged) from 09/08/2018 in [facility]  AIMS Total Score 0      AUDIT    Flowsheet Row Admission (Discharged) from 09/08/2018 in [facility]  Alcohol Use Disorder Identification Test Final Score (AUDIT) 1      GAD-7    Flowsheet Row Counselor from 10/05/2018 in [place] Counselor from 10/02/2018 in [place] Counselor from 09/09/2017 in [place] Counselor from 04/04/2017 in [place] Counselor from 03/18/2017 in [place]  Total GAD-7 Score _0 PHQ2-9    Flowsheet Row Counselor from 07/22/2022 in [place] from 07/16/2022 in [place] Counselor from 07/01/2022 in [place] Counselor from 06/19/2022 in [place] Office Visit from 05/09/2022 in [place]  PHQ-2 Total Score _1 PHQ-9 Total Score _2 Flowsheet Row Counselor from 07/22/2022 in [place] from 07/01/2022 in [place] Counselor from 06/19/2022 in [place] Error: Question 6 not populated Error: Question 6 not populated Low Risk       Assessment and Plan:  Christine [name] is a 46 yr old female who presents via Virtual Video to Establish Care and for medication management, she enrolled in the IOP Program 07/22/2022.  PPHx is significant for Bipolar Disorder and Anxiety, a history of Self Injurious Behavior (cutting- last 2020), and 4 Psychiatric Hospitalizations (last [facility] 09/2018), and no history of Suicide Attempts.    "Christine [name]" is continuing to do well with the increase  in Lithium done during the [facility] Program.   She will receive benefit from the IOP Program.  As she is doing well we will not make any changes to her medications at this time.  We will continue to monitor.    Bipolar Disorder, current episode Depressed: -Continue Caplyta 42 mg daily for mood stability. No refills sent at this time. -Continue Lamictal 150 mg BID for mood stability.  No refills sent at this time. -Continue Lithium 600 mg BID for depression and mood stability.  No refills sent at this time. -Continue Klonopin 0.5 mg daily PRN for severe anxiety.  No refills sent at this time. -Continue to engage in IOP programming    Collaboration of Care: Other IOP Program  Patient/Guardian was advised Release of Information must be obtained prior to any record release in order to collaborate their care with an outside provider. Patient/Guardian was advised if they have not already done so to  contact the registration department to sign all necessary forms in order for [place] to release information regarding their care.   Consent: Patient/Guardian gives verbal consent for treatment and assignment of benefits for services provided during this visit. Patient/Guardian expressed understanding and agreed to proceed.   [name], MD 12/19/20231:39 PM    Follow Up Instructions:    I discussed the assessment and treatment plan with the patient. The patient was provided an opportunity to ask questions and all were answered. The patient agreed with the plan and demonstrated an understanding of the instructions.   The patient was advised to call back or seek an in-person evaluation if the symptoms worsen or if the condition fails to improve as anticipated.  I provided 30 minutes of non-face-to-face time during this encounter.   [name], MD

## 2022-07-23 NOTE — Psych (Signed)
Virtual Visit via Video Note  I connected with Christine [name] on 07/17/22 at  9:00 AM EST by a video enabled telemedicine application and verified that I am speaking with the correct person using two identifiers.  Location: Patient: patient home Provider: clinical home office   I discussed the limitations of evaluation and management by telemedicine and the availability of in person appointments. The patient expressed understanding and agreed to proceed.  I discussed the assessment and treatment plan with the patient. The patient was provided an opportunity to ask questions and all were answered. The patient agreed with the plan and demonstrated an understanding of the instructions.   The patient was advised to call back or seek an in-person evaluation if the symptoms worsen or if the condition fails to improve as anticipated.  Pt was provided 240 minutes of non-face-to-face time during this encounter.   [name], LCSW   [facility] PHP THERAPIST PROGRESS NOTE  Christine [name] [id number]  Session Time: 9:00 - 10:00  Participation Level: Active  Behavioral Response: CasualAlertDepressed  Type of Therapy: Group Therapy  Treatment Goals addressed: Coping  Progress Towards Goals: Progressing  Interventions: CBT, DBT, Supportive, and Reframing  Summary: Christine [name] is a 46 y.o. female who presents with depression symptoms.  Clinician led check-in regarding current stressors and situation, and review of patient completed daily inventory. Clinician utilized active listening and empathetic response and validated patient emotions. Clinician facilitated processing group on pertinent issues.?   Therapist Response: Patient arrived within time allowed. Patient rates her mood at a 6 on a scale of 1-10 with 10 being best. Pt states she feels "okay this morning." Pt reports having a friend issue yesterday that upset her and took up most of the day. Pt states sleeping 4.5 hours and eating  1x. Pt reports struggling with putting herself first. Patient able to process. Patient engaged in discussion.       Session Time: 10:00 am - 11:00 am   Participation Level: Active   Behavioral Response: CasualAlertDepressed   Type of Therapy: Group Therapy   Treatment Goals addressed: Coping   Progress Towards Goals: Progressing   Interventions: CBT, DBT, Solution Focused, Strength-based, Supportive, and Reframing   Therapist Response: Cln led processing group for pt's current struggles. Group members shared stressors and provided support and feedback. Cln brought in topics of boundaries, healthy relationships, and unhealthy thought processes to inform discussion.    Therapist Response:  Pt able to process and provide support to group.          Session Time: 11:00 -12:00   Participation Level: Active   Behavioral Response: CasualAlertDepressed   Type of Therapy: Group Therapy, Spiritual Care   Treatment Goals addressed: Coping   Progress Towards Goals: Progressing   Interventions: Supportive, Education   Summary:  Christine [name], Chaplain, led group.   Therapist Response: Pt participated         Session Time: 12:00 -1:00   Participation Level: Active   Behavioral Response: CasualAlertDepressed   Type of Therapy: Group therapy, Occupational Therapy   Treatment Goals addressed: Coping   Progress Towards Goals: Progressing   Interventions: Supportive; Psychoeducation   Summary: 12:00 - 12:50: Occupational Therapy group led by cln E. Hollan. 12:50 - 1:00 Clinician assessed for immediate needs, medication compliance and efficacy, and safety concerns.   Therapist Response: 12:00 - 12:50: See OT note 12:50 - 1:00 pm: At check-out, patient reports no immediate concerns. Patient demonstrates progress as evidenced by continued engagement in group  and responsiveness to treatment. Patient denies SI/HI/self-harm thoughts at the end of group.  Suicidal/Homicidal:  Nowithout intent/plan  Plan: Pt will continue in PHP while working to decrease depression symptoms, increase emotion regulation, and increase ability to manage symptoms in a healthy manner.   Collaboration of Care: Medication Management AEB A Pashayan  Patient/Guardian was advised Release of Information must be obtained prior to any record release in order to collaborate their care with an outside provider. Patient/Guardian was advised if they have not already done so to contact the registration department to sign all necessary forms in order for [place] to release information regarding their care.   Consent: Patient/Guardian gives verbal consent for treatment and assignment of benefits for services provided during this visit. Patient/Guardian expressed understanding and agreed to proceed.   Diagnosis: Bipolar 1 disorder, depressed, severe ([place]) [F31.4]    1. Bipolar 1 disorder, depressed, severe ([place])   2. Generalized anxiety disorder     [name], LCSW

## 2022-07-23 NOTE — Psych (Signed)
Virtual Visit via Video Note  I connected with Christine [name] on 07/16/22 at  9:00 AM EST by a video enabled telemedicine application and verified that I am speaking with the correct person using two identifiers.  Location: Patient: patient home Provider: clinical home office   I discussed the limitations of evaluation and management by telemedicine and the availability of in person appointments. The patient expressed understanding and agreed to proceed.  I discussed the assessment and treatment plan with the patient. The patient was provided an opportunity to ask questions and all were answered. The patient agreed with the plan and demonstrated an understanding of the instructions.   The patient was advised to call back or seek an in-person evaluation if the symptoms worsen or if the condition fails to improve as anticipated.  Pt was provided 240 minutes of non-face-to-face time during this encounter.   [name], LCSW   [facility] PHP THERAPIST PROGRESS NOTE  Christine [name] [id number]  Session Time: 9:00 - 10:00  Participation Level: Active  Behavioral Response: CasualAlertDepressed  Type of Therapy: Group Therapy  Treatment Goals addressed: Coping  Progress Towards Goals: Progressing  Interventions: CBT, DBT, Supportive, and Reframing  Summary: Christine [name] is a 46 y.o. female who presents with depression symptoms.  Clinician led check-in regarding current stressors and situation, and review of patient completed daily inventory. Clinician utilized active listening and empathetic response and validated patient emotions. Clinician facilitated processing group on pertinent issues.?   Therapist Response: Patient arrived within time allowed. Patient rates her mood at a 5 on a scale of 1-10 with 10 being best. Pt states she feels "fine." Pt reports feeling "kind of proud" of herself for making positive efforts of downloading a mood tracker and reaching out to a possible  friend. Pt states sleeping 5 hours and eating 2x. Pt reports struggling with confidence. Patient able to process. Patient engaged in discussion.       Session Time: 10:00 am - 11:00 am   Participation Level: Active   Behavioral Response: CasualAlertDepressed   Type of Therapy: Group Therapy   Treatment Goals addressed: Coping   Progress Towards Goals: Progressing   Interventions: CBT, DBT, Solution Focused, Strength-based, Supportive, and Reframing   Therapist Response: Cln led discussion on CBT core beliefs. Cln provided context for core beliefs and how they impact our perception. Group members discussed possible core beliefs they hold and how it is affecting them.    Therapist Response:  Pt engaged in discussion and is able to identify core beliefs they experience.          Session Time: 11:00 -12:00   Participation Level: Active   Behavioral Response: CasualAlertDepressed   Type of Therapy: Group Therapy   Treatment Goals addressed: Coping   Progress Towards Goals: Progressing   Interventions: CBT, DBT, Solution Focused, Strength-based, Supportive, and Reframing   Summary: Cln facilitated processing group around difficult relationships. Group members shared struggles they face in relationships. Cln brought in topics of self-esteem, CBT thought challenging, boundaries, and communication to aid growth.   Therapist Response: Pt engaged in discussion and is able to process.         Session Time: 12:00 -1:00   Participation Level: Active   Behavioral Response: CasualAlertDepressed   Type of Therapy: Group therapy, Occupational Therapy   Treatment Goals addressed: Coping   Progress Towards Goals: Progressing   Interventions: Supportive; Psychoeducation   Summary: 12:00 - 12:50: Occupational Therapy group led by cln E. Hollan. 12:50 - 1:00  Clinician assessed for immediate needs, medication compliance and efficacy, and safety concerns.   Therapist Response:  12:00 - 12:50: See OT note 12:50 - 1:00 pm: At check-out, patient reports no immediate concerns. Patient demonstrates progress as evidenced by continued engagement in group and responsiveness to treatment. Patient denies SI/HI/self-harm thoughts at the end of group.  Suicidal/Homicidal: Nowithout intent/plan  Plan: Pt will continue in PHP while working to decrease depression symptoms, increase emotion regulation, and increase ability to manage symptoms in a healthy manner.   Collaboration of Care: Medication Management AEB A Pashayan  Patient/Guardian was advised Release of Information must be obtained prior to any record release in order to collaborate their care with an outside provider. Patient/Guardian was advised if they have not already done so to contact the registration department to sign all necessary forms in order for [place] to release information regarding their care.   Consent: Patient/Guardian gives verbal consent for treatment and assignment of benefits for services provided during this visit. Patient/Guardian expressed understanding and agreed to proceed.   Diagnosis: Bipolar 1 disorder, depressed, severe ([place]) [F31.4]    1. Bipolar 1 disorder, depressed, severe ([place])   2. Generalized anxiety disorder     [name], LCSW

## 2022-07-24 ENCOUNTER — Other Ambulatory Visit (HOSPITAL_COMMUNITY): Payer: Medicare Other | Admitting: Psychiatry

## 2022-07-24 ENCOUNTER — Other Ambulatory Visit (HOSPITAL_COMMUNITY): Payer: Medicare Other

## 2022-07-24 ENCOUNTER — Telehealth (HOSPITAL_COMMUNITY): Payer: Self-pay | Admitting: Psychiatry

## 2022-07-24 DIAGNOSIS — F3131 Bipolar disorder, current episode depressed, mild: Secondary | ICD-10-CM | POA: Diagnosis not present

## 2022-07-24 DIAGNOSIS — F411 Generalized anxiety disorder: Secondary | ICD-10-CM | POA: Diagnosis not present

## 2022-07-25 ENCOUNTER — Other Ambulatory Visit (HOSPITAL_COMMUNITY): Payer: Medicare Other | Admitting: Licensed Clinical Social Worker

## 2022-07-25 ENCOUNTER — Other Ambulatory Visit (HOSPITAL_COMMUNITY): Payer: Medicare Other

## 2022-07-25 DIAGNOSIS — F3162 Bipolar disorder, current episode mixed, moderate: Secondary | ICD-10-CM

## 2022-07-25 DIAGNOSIS — F411 Generalized anxiety disorder: Secondary | ICD-10-CM

## 2022-07-25 DIAGNOSIS — Z9152 Personal history of nonsuicidal self-harm: Secondary | ICD-10-CM | POA: Diagnosis not present

## 2022-07-25 DIAGNOSIS — G4733 Obstructive sleep apnea (adult) (pediatric): Secondary | ICD-10-CM | POA: Diagnosis not present

## 2022-07-25 DIAGNOSIS — E88819 Insulin resistance, unspecified: Secondary | ICD-10-CM | POA: Diagnosis not present

## 2022-07-25 DIAGNOSIS — I110 Hypertensive heart disease with heart failure: Secondary | ICD-10-CM | POA: Diagnosis not present

## 2022-07-25 NOTE — Progress Notes (Signed)
Virtual Visit via Video Note   I connected with Christine [name], who prefers to go by "Christine [name]" on 07/25/22 at  9:00 AM EDT by a video enabled telemedicine application and verified that I am speaking with the correct person using two identifiers.   At orientation to the IOP program, Case Manager discussed the limitations of evaluation and management by telemedicine and the availability of in person appointments. The patient expressed understanding and agreed to proceed with virtual visits throughout the duration of the program.   Location:  Patient: Patient Home Provider: [facility] [place] [facility]   History of Present Illness: Bipolar I Disorder and GAD   Observations/Objective: Check In: Case Manager checked in with all participants to review discharge dates, insurance authorizations, work-related documents and needs from the treatment team regarding medications. [name] stated needs and engaged in discussion.    Initial Therapeutic Activity: Counselor facilitated a check-in with Christine [name] to assess for safety, sobriety and medication compliance.  Counselor also inquired about Christine [name]'s current emotional ratings, as well as any significant changes in thoughts, feelings or behavior since previous check in.  Christine [name] presented for session on time and was alert, oriented x5, with no evidence or self-report of active SI/HI or A/V H.  Christine [name] reported compliance with medication and denied use of alcohol or illicit substances.  Christine [name] reported scores of 3/10 for depression, 4/10 for anxiety, and 0/10 for anger/irritability.  Christine [name] denied any recent outbursts or panic attacks.  Christine [name] reported that a recent success was attending an individual therapy appointment with a new provider yesterday, stating "It went really well. I think I'm going to enjoy her company".  Christine [name] reported that she took time to "Rest and decompress" afterward for self-care.  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  Christine [name] reported that her goal today is to get a cake to celebrate her husband's birthday.         Second Therapeutic Activity: Counselor utilized a Radio broadcast assistant with group members today to guide discussion on topic of codependency.  This handout defined codependency as excessive emotional or psychological reliance upon someone who requires support on account of an illness or addiction.  It also explained how this issue presents in dysfunctional family systems, including behavior such as denying existence of problems, rigid boundaries on communication, strained trust, lack of individuality, and reinforcement of unhealthy coping mechanisms such as substance use.  Characteristics of co-dependent people were listed for assistance with identification, such as extreme need for approval/recognition, difficulty identifying feelings, poor communication, and more.  Members were also tasked with completing a questionnaire in order to identify signs of codependency and results were discussed afterward.  This handout also offered strategies for resolving co-dependency within one's network, including increased use of assertive communication skills in order to set appropriate boundaries.  Intervention was effective, as evidenced by [name] actively participating in discussion on the subject, and completing codependency questionnaire, with 9 out of 20 positive responses.  Christine [name] reported that she has lost touch with her own personal needs over time, and tends to put other's health, feelings and needs over her own, including her family.  Christine [name] reported that she believes this pattern developed in childhood due to her mother attempting to conceal mental illness from her, stating "I do think her issues with not accepting mental illness might have impacted how she treated me.  She was never able to support me through my own issues".  She  reported that her goal will be to work on building  self-esteem during treatment, and practicing assertive communication skills so that she can express her feelings and needs more honestly.  Christine [name] reported that she hopes that breaking this pattern will help her be a positive role model to her daughters as well, stating "I want my girls to be comfortable talking to me about anything".      Assessment and Plan: Counselor recommends that [name] remain in IOP treatment to better manage mental health symptoms, ensure stability and pursue completion of treatment plan goals. Counselor recommends adherence to crisis/safety plan, taking medications as prescribed, and following up with medical professionals if any issues arise.   Follow Up Instructions: Counselor will send Webex link for next session. [name] was advised to call back or seek an in-person evaluation if the symptoms worsen or if the condition fails to improve as anticipated.   Collaboration of Care:   Medication Management AEB Dr. Fatima Sanger or Ricky Ala, NP                                          Case Manager AEB Dellia Nims, CNA   Patient/Guardian was advised Release of Information must be obtained prior to any record release in order to collaborate their care with an outside provider. Patient/Guardian was advised if they have not already done so to contact the registration department to sign all necessary forms in order for [place] to release information regarding their care.   Consent: Patient/Guardian gives verbal consent for treatment and assignment of benefits for services provided during this visit. Patient/Guardian expressed understanding and agreed to proceed.  I provided 180 minutes of non-face-to-face time during this encounter.   Shade Flood, [place], LCAS 07/25/22

## 2022-07-26 ENCOUNTER — Other Ambulatory Visit (HOSPITAL_COMMUNITY): Payer: Medicare Other

## 2022-07-26 ENCOUNTER — Other Ambulatory Visit (HOSPITAL_COMMUNITY): Payer: Medicare Other | Admitting: Licensed Clinical Social Worker

## 2022-07-26 DIAGNOSIS — G4733 Obstructive sleep apnea (adult) (pediatric): Secondary | ICD-10-CM | POA: Diagnosis not present

## 2022-07-26 DIAGNOSIS — F3162 Bipolar disorder, current episode mixed, moderate: Secondary | ICD-10-CM | POA: Diagnosis not present

## 2022-07-26 DIAGNOSIS — F411 Generalized anxiety disorder: Secondary | ICD-10-CM | POA: Diagnosis not present

## 2022-07-26 DIAGNOSIS — Z9152 Personal history of nonsuicidal self-harm: Secondary | ICD-10-CM | POA: Diagnosis not present

## 2022-07-26 DIAGNOSIS — E88819 Insulin resistance, unspecified: Secondary | ICD-10-CM | POA: Diagnosis not present

## 2022-07-26 DIAGNOSIS — I110 Hypertensive heart disease with heart failure: Secondary | ICD-10-CM | POA: Diagnosis not present

## 2022-07-26 NOTE — Progress Notes (Signed)
Virtual Visit via Video Note   I connected with Christine [name], who prefers to go by "Christine [name]" on 07/26/22 at  9:00 AM EDT by a video enabled telemedicine application and verified that I am speaking with the correct person using two identifiers.   At orientation to the IOP program, Case Manager discussed the limitations of evaluation and management by telemedicine and the availability of in person appointments. The patient expressed understanding and agreed to proceed with virtual visits throughout the duration of the program.   Location:  Patient: Patient Home Provider: [facility] [place] [facility]   History of Present Illness: Bipolar I Disorder and GAD   Observations/Objective: Check In: Case Manager checked in with all participants to review discharge dates, insurance authorizations, work-related documents and needs from the treatment team regarding medications. [name] stated needs and engaged in discussion.    Initial Therapeutic Activity: Counselor facilitated a check-in with Christine [name] to assess for safety, sobriety and medication compliance.  Counselor also inquired about Christine [name]'s current emotional ratings, as well as any significant changes in thoughts, feelings or behavior since previous check in.  Christine [name] presented for session on time and was alert, oriented x5, with no evidence or self-report of active SI/HI or A/V H.  Christine [name] reported compliance with medication and denied use of alcohol or illicit substances.  Christine [name] reported scores of 2/10 for depression, 7/10 for anxiety, and 0/10 for anger/irritability.  Christine [name] denied any recent outbursts or panic attacks.  [name] reported that a recent success was celebrating her husbands birthday yesterday by ordering a nice meal to celebrate.  Christine [name] reported that her goal today is to make arrangements with her husband to celebrate her birthday today, and knit during downtime for self-care.       Second Therapeutic Activity: Counselor introduced [employer], [facility]  Chaplain to provide psychoeducation on topic of Grief and Loss with members today.  Curt Bears began discussion by checking in with the group about their baseline mood today, general thoughts on what grief means to them and how it has affected them personally in the past.  Curt Bears provided information on how the process of grief/loss can differ depending upon one's unique culture, and categories of loss one could experience (i.e. loss of a person, animal, relationship, job, identity, etc).  Curt Bears encouraged members to be mindful of how pervasive loss can be, and how to recognize signs which could indicate that this is having an impact on one's overall mental health and wellbeing.  Intervention was effective, as evidenced by [name] participating in discussion with speaker on the subject, reporting that she is feeling overwhelmed with the upcoming holidays, stating "We have family in different locations.  Its going to be exhausting, and its harder dealing with change that has happened.  With the exception of my brother, I don't have any 'real' family left".  [name] reported that her father in law passed recently, and this has forced her to consider establishing new traditions, although she worries about not having enough time for self-care with all the energy she will need to put into planning for these events.  [name] was receptive to feedback from chaplain on importance of establishing healthy boundaries with family to ensure that she doesn't get burnt out during this difficult time.      Third Therapeutic Activity: Counselor discussed topic of distress tolerance today with group.  Counselor shared virtual handout with members that offered a DBT approach represented by the acronym ACCEPTS, and outlined strategies for distracting oneself from distressing emotions,  allowing appropriate time for these emotions to lesson in intensity and eventually fade away.  Strategies offered included engaging in positive  activities, contributing to the wellbeing of others, comparing one's present situation to a previously difficult one to highlight resilience, using mental imagery, and physical grounding.  Counselor assisted members in creating their own realistic ACCEPTS plan for tackling a distressing emotion of their choice.  Intervention was effective, as evidenced by [name] participating in creation of the plan, choosing depression as her emotion of focus, and identifying several helpful approaches for distraction, such as doing a sudoku puzzle, reading a mystery novel, playing a card game with her daughters, writing a thoughtful letter to someone they care about, reflecting upon previous challenges she has overcome to reinforce sense of resilience, watching a stand up comedy special that can make her laugh, listening to hip hop music that energizes her, visualizing herself at the beach relaxing, and doing math problems in her head.   Assessment and Plan: Counselor recommends that [place] remain in IOP treatment to better manage mental health symptoms, ensure stability and pursue completion of treatment plan goals. Counselor recommends adherence to crisis/safety plan, taking medications as prescribed, and following up with medical professionals if any issues arise.   Follow Up Instructions: Counselor will send Webex link for next session. [name] was advised to call back or seek an in-person evaluation if the symptoms worsen or if the condition fails to improve as anticipated.   Collaboration of Care:   Medication Management AEB Dr. Fatima Sanger or Ricky Ala, NP                                          Case Manager AEB Dellia Nims, CNA   Patient/Guardian was advised Release of Information must be obtained prior to any record release in order to collaborate their care with an outside provider. Patient/Guardian was advised if they have not already done so to contact the registration department to sign all necessary  forms in order for [place] to release information regarding their care.   Consent: Patient/Guardian gives verbal consent for treatment and assignment of benefits for services provided during this visit. Patient/Guardian expressed understanding and agreed to proceed.  I provided 180 minutes of non-face-to-face time during this encounter.   Shade Flood, [place], LCAS 07/26/22

## 2022-07-30 ENCOUNTER — Other Ambulatory Visit (HOSPITAL_COMMUNITY): Payer: Medicare Other | Admitting: Licensed Clinical Social Worker

## 2022-07-30 DIAGNOSIS — F411 Generalized anxiety disorder: Secondary | ICD-10-CM

## 2022-07-30 DIAGNOSIS — F3162 Bipolar disorder, current episode mixed, moderate: Secondary | ICD-10-CM

## 2022-07-30 DIAGNOSIS — E88819 Insulin resistance, unspecified: Secondary | ICD-10-CM | POA: Diagnosis not present

## 2022-07-30 DIAGNOSIS — I110 Hypertensive heart disease with heart failure: Secondary | ICD-10-CM | POA: Diagnosis not present

## 2022-07-30 DIAGNOSIS — Z9152 Personal history of nonsuicidal self-harm: Secondary | ICD-10-CM | POA: Diagnosis not present

## 2022-07-30 DIAGNOSIS — G4733 Obstructive sleep apnea (adult) (pediatric): Secondary | ICD-10-CM | POA: Diagnosis not present

## 2022-07-30 NOTE — Progress Notes (Signed)
Virtual Visit via Video Note   I connected with Christine [name], who prefers to go by "Christine [name]" on 07/30/22 at  9:00 AM EDT by a video enabled telemedicine application and verified that I am speaking with the correct person using two identifiers.   At orientation to the IOP program, Case Manager discussed the limitations of evaluation and management by telemedicine and the availability of in person appointments. The patient expressed understanding and agreed to proceed with virtual visits throughout the duration of the program.   Location:  Patient: Patient Home Provider: Clinical Home Office   History of Present Illness: Bipolar I Disorder and GAD   Observations/Objective: Check In: Case Manager checked in with all participants to review discharge dates, insurance authorizations, work-related documents and needs from the treatment team regarding medications. [name] stated needs and engaged in discussion.    Initial Therapeutic Activity: Counselor facilitated a check-in with Christine [name] to assess for safety, sobriety and medication compliance.  Counselor also inquired about Christine [name]'s current emotional ratings, as well as any significant changes in thoughts, feelings or behavior since previous check in.  Christine [name] presented for session on time and was alert, oriented x5, with no evidence or self-report of active SI/HI or A/V H.  Christine [name] reported compliance with medication and denied use of alcohol or illicit substances.  Christine [name] reported scores of 6/10 for depression, 6/10 for anxiety, and 0/10 for anger/irritability.  Christine [name] denied any recent outbursts or panic attacks.  [name] reported that a recent success was spending Christmas with her family watching a movie, and exchanging gifts.  Christine [name] reported that a recent struggle was feeling overwhelmed at times, although she was able to practice her coping skills to manage this.  [name] reported that her goal today is to spend time on self-care activities to distract  herself from recent stressors.         Second Therapeutic Activity: Counselor discussed topic of gratitude journaling with members as a form of self-care.  Counselor virtually shared a handout with the group today which explained the benefits of this practice, including reduction in stress, increased happiness, and self-esteem.  Tips were also provided to aid in practice, such as taking time with entries, writing about people one is grateful for, and setting goal for two entries per week for at least 10-20 minutes at a time.  Counselor also provided group members with a variety of journaling prompts to choose from today, and encouraged each member to take time to write about something they are grateful for, with examples such as "Something beautiful I recently saw was..", "Something I can be proud of is.", "A reason to be excited for the future is." and more.  Members were encouraged to share their entry with the group, along with their perspective on the activity and motivation level towards making this a habit. Intervention was effective, as evidenced by [name] participating in journaling activity, and choosing the prompts "Someone I can always rely on is." and "Something I can be proud of is.".  Christine [name] expressed gratitude for a close friend that she has always been able to rely upon, since they understand her mental health needs, do not judge her, and always give supportive feedback when she is struggling.  [name] reported that she is also proud of how she raised her 2 daughters, since they are smart, compassionate, and empathetic enough to understand when she needs help as well.  Christine [name] reported that she would like to make gratitude journaling a regular habit, stating "I tend  to be a negative person and this would help me move beyond that".      Third Therapeutic Activity: Counselor also acknowledged a graduating group member by prompting this member to reflect on progress made since beginning the [facility]  program, notable takeaways from sessions attended, challenges overcome, and plan for continued care following discharge. Counselor and group members shared observations of growth, words of encouragement and support as this member transitioned out of the program today.  Intervention was effective, as evidenced by [name] encouraging the graduating member to continue with therapy based upon their positive experience and reminding them of notable progress that they have made during group therapy so far.    Assessment and Plan: Counselor recommends that [place] remain in IOP treatment to better manage mental health symptoms, ensure stability and pursue completion of treatment plan goals. Counselor recommends adherence to crisis/safety plan, taking medications as prescribed, and following up with medical professionals if any issues arise.   Follow Up Instructions: Counselor will send Webex link for next session. [name] was advised to call back or seek an in-person evaluation if the symptoms worsen or if the condition fails to improve as anticipated.   Collaboration of Care:   Medication Management AEB Dr. Fatima Sanger or Ricky Ala, NP                                          Case Manager AEB Dellia Nims, CNA   Patient/Guardian was advised Release of Information must be obtained prior to any record release in order to collaborate their care with an outside provider. Patient/Guardian was advised if they have not already done so to contact the registration department to sign all necessary forms in order for [place] to release information regarding their care.   Consent: Patient/Guardian gives verbal consent for treatment and assignment of benefits for services provided during this visit. Patient/Guardian expressed understanding and agreed to proceed.  I provided 170 minutes of non-face-to-face time during this encounter.   Shade Flood, [place], LCAS 07/30/22

## 2022-07-31 ENCOUNTER — Other Ambulatory Visit (HOSPITAL_COMMUNITY): Payer: Medicare Other | Admitting: Licensed Clinical Social Worker

## 2022-07-31 DIAGNOSIS — Z9152 Personal history of nonsuicidal self-harm: Secondary | ICD-10-CM | POA: Diagnosis not present

## 2022-07-31 DIAGNOSIS — F3162 Bipolar disorder, current episode mixed, moderate: Secondary | ICD-10-CM

## 2022-07-31 DIAGNOSIS — F411 Generalized anxiety disorder: Secondary | ICD-10-CM | POA: Diagnosis not present

## 2022-07-31 DIAGNOSIS — G4733 Obstructive sleep apnea (adult) (pediatric): Secondary | ICD-10-CM | POA: Diagnosis not present

## 2022-07-31 DIAGNOSIS — E88819 Insulin resistance, unspecified: Secondary | ICD-10-CM | POA: Diagnosis not present

## 2022-07-31 DIAGNOSIS — I110 Hypertensive heart disease with heart failure: Secondary | ICD-10-CM | POA: Diagnosis not present

## 2022-08-01 ENCOUNTER — Other Ambulatory Visit (HOSPITAL_COMMUNITY): Payer: Medicare Other | Admitting: Professional

## 2022-08-01 DIAGNOSIS — Z9152 Personal history of nonsuicidal self-harm: Secondary | ICD-10-CM | POA: Diagnosis not present

## 2022-08-01 DIAGNOSIS — G4733 Obstructive sleep apnea (adult) (pediatric): Secondary | ICD-10-CM | POA: Diagnosis not present

## 2022-08-01 DIAGNOSIS — F411 Generalized anxiety disorder: Secondary | ICD-10-CM | POA: Diagnosis not present

## 2022-08-01 DIAGNOSIS — I110 Hypertensive heart disease with heart failure: Secondary | ICD-10-CM | POA: Diagnosis not present

## 2022-08-01 DIAGNOSIS — F3163 Bipolar disorder, current episode mixed, severe, without psychotic features: Secondary | ICD-10-CM

## 2022-08-01 DIAGNOSIS — E88819 Insulin resistance, unspecified: Secondary | ICD-10-CM | POA: Diagnosis not present

## 2022-08-01 DIAGNOSIS — F3162 Bipolar disorder, current episode mixed, moderate: Secondary | ICD-10-CM | POA: Diagnosis not present

## 2022-08-01 NOTE — Progress Notes (Signed)
Virtual Visit via Video Note  I connected with [name] on 07/31/22 at  9:00 AM EST by a video enabled telemedicine application and verified that I am speaking with the correct person using two identifiers.  Location: Patient: patient home Provider: clinical home office   I discussed the limitations of evaluation and management by telemedicine and the availability of in person appointments. The patient expressed understanding and agreed to proceed.  I discussed the assessment and treatment plan with the patient. The patient was provided an opportunity to ask questions and all were answered. The patient agreed with the plan and demonstrated an understanding of the instructions.   The patient was advised to call back or seek an in-person evaluation if the symptoms worsen or if the condition fails to improve as anticipated.  I provided 180 minutes of non-face-to-face time during this encounter.   [name], LCSW   Daily Group Progress Note  Program: IOP  Group Time: 9:00 - 10:30  Participation Level: Active  Behavioral Response: Appropriate and Sharing  Type of Therapy:  Group Therapy  Summary of Progress: Clinician led check-in regarding current stressors and situation, and review of patient completed daily inventory. Clinician utilized active listening and empathetic response and validated patient emotions. Clinician facilitated processing group on pertinent issues.?    Patient arrived within time allowed. Patient rates her mood at a 3 on a scale of 1-10 with 10 being best. Pt states she feels "really low." Pt states she slept 6 hours and ate 2x. Pt reports her mood dipped after the holiday because she wanted/expected to feel better before the new year and it doesn't seem it will happen. Pt shares feeling she has lost her progress due to this dip and that she has "failed." Pt reports passive SI  and denies plan intent. Patient able to process. Patient engaged in  discussion.     Progress Towards Goals: Progressing   Group Time: 10:30 - 12:00  Participation Level:  Active  Behavioral Response: Appropriate and Sharing  Type of Therapy: Group Therapy  Summary of Progress: Cln led discussion on negative self-talk and how it affects [place]. Cln utilized CBT to discuss how thoughts shape our feelings and actions. Group members shared how negative thinking affects them and worked to reframe their negative thinking.     Pt engaged in discussion and reports understanding.     Progress Towards Goals: Progressing   [name], LCSW

## 2022-08-02 ENCOUNTER — Other Ambulatory Visit (HOSPITAL_COMMUNITY): Payer: Medicare Other | Admitting: Licensed Clinical Social Worker

## 2022-08-02 DIAGNOSIS — F3162 Bipolar disorder, current episode mixed, moderate: Secondary | ICD-10-CM

## 2022-08-02 DIAGNOSIS — F411 Generalized anxiety disorder: Secondary | ICD-10-CM | POA: Diagnosis not present

## 2022-08-02 DIAGNOSIS — E88819 Insulin resistance, unspecified: Secondary | ICD-10-CM | POA: Diagnosis not present

## 2022-08-02 DIAGNOSIS — I110 Hypertensive heart disease with heart failure: Secondary | ICD-10-CM | POA: Diagnosis not present

## 2022-08-02 DIAGNOSIS — Z9152 Personal history of nonsuicidal self-harm: Secondary | ICD-10-CM | POA: Diagnosis not present

## 2022-08-02 DIAGNOSIS — G4733 Obstructive sleep apnea (adult) (pediatric): Secondary | ICD-10-CM | POA: Diagnosis not present

## 2022-08-02 NOTE — Progress Notes (Signed)
Virtual Visit via Video Note   I connected with Christine [name], who prefers to go by "Christine [name]" on 08/02/22 at  9:00 AM EDT by a video enabled telemedicine application and verified that I am speaking with the correct person using two identifiers.   At orientation to the IOP program, Case Manager discussed the limitations of evaluation and management by telemedicine and the availability of in person appointments. The patient expressed understanding and agreed to proceed with virtual visits throughout the duration of the program.   Location:  Patient: Patient Home Provider: Clinical Home Office   History of Present Illness: Bipolar I Disorder and GAD   Observations/Objective: Check In: Case Manager checked in with all participants to review discharge dates, insurance authorizations, work-related documents and needs from the treatment team regarding medications. [name] stated needs and engaged in discussion.    Initial Therapeutic Activity: Counselor facilitated a check-in with Christine [name] to assess for safety, sobriety and medication compliance.  Counselor also inquired about Christine [name]'s current emotional ratings, as well as any significant changes in thoughts, feelings or behavior since previous check in.  Christine [name] presented for session on time and was alert, oriented x5, with no evidence or self-report of active SI/HI or A/V H.  Christine [name] reported compliance with medication and denied use of alcohol or illicit substances.  Christine [name] reported scores of 4/10 for depression, 5/10 for anxiety, and 0/10 for anger/irritability.  Christine [name] denied any recent outbursts or panic attacks.  Christine [name] reported that a recent success was finishing a scarf she was knitting to give her daughter.  [name] reported that a recent struggle has been pushing herself to be more productive recently despite lack of motivation some days.  Christine [name] reported that her goal today is to pick up some groceries and do more knitting.         Second  Therapeutic Activity: Counselor engaged the group in discussion on managing work/life balance today to improve mental health and wellness.  Counselor explained how finding balance between responsibilities at home and work place can be challenging, and lead to increased stress.  Counselor facilitated discussion on what challenges members are currently, or have historically faced.  Counselor also discussed strategies for improving work/life balance while members work on their mental health during treatment.  Some of these included keeping track of time management; creating a list of priorities and scaling importance; setting realistic, measurable goals each day; establishing boundaries; taking care of health needs; and nurturing relationships at home and work for support.  Counselor inquired about areas where members feel they are excelling, as well as areas they could focus on during treatment. Intervention was effective, as evidenced by [name] actively participating in discussion on topic and reporting that managing a work life balance has been difficult for her, as she tends to put unreasonable expectations on herself in a work environment, which leads her to "Crash and burn each time".  Christine [name] reported that she has experienced several symptoms of burnout, including self-doubt, feeling detached, lacking motivation, and feeling overwhelmed.  Christine [name] reported that there have also been numerous warning signs such as working long hours and taking work home each night, spending an inordinate amount of time worrying about work, lacking energy and motivation, and losing valuable time with supports like her family.  [name] was receptive to suggestions offered today for addressing work life imbalance, including making a daily to do list to structure the day ahead, learning to say "No" assertively to unrealistic work demands, and setting aside more time  each day to focus on self-care activities such as knitting.     Assessment and Plan: Counselor recommends that [place] remain in IOP treatment to better manage mental health symptoms, ensure stability and pursue completion of treatment plan goals. Counselor recommends adherence to crisis/safety plan, taking medications as prescribed, and following up with medical professionals if any issues arise.   Follow Up Instructions: Counselor will send Webex link for next session. [name] was advised to call back or seek an in-person evaluation if the symptoms worsen or if the condition fails to improve as anticipated.   Collaboration of Care:   Medication Management AEB Dr. Fatima Sanger or Ricky Ala, NP                                          Case Manager AEB Dellia Nims, CNA   Patient/Guardian was advised Release of Information must be obtained prior to any record release in order to collaborate their care with an outside provider. Patient/Guardian was advised if they have not already done so to contact the registration department to sign all necessary forms in order for [place] to release information regarding their care.   Consent: Patient/Guardian gives verbal consent for treatment and assignment of benefits for services provided during this visit. Patient/Guardian expressed understanding and agreed to proceed.  I provided 180 minutes of non-face-to-face time during this encounter.   Shade Flood, [place], LCAS 08/02/22

## 2022-08-05 DIAGNOSIS — H6993 Unspecified Eustachian tube disorder, bilateral: Secondary | ICD-10-CM | POA: Diagnosis not present

## 2022-08-05 DIAGNOSIS — H8113 Benign paroxysmal vertigo, bilateral: Secondary | ICD-10-CM | POA: Diagnosis not present

## 2022-08-05 DIAGNOSIS — R110 Nausea: Secondary | ICD-10-CM | POA: Diagnosis not present

## 2022-08-06 ENCOUNTER — Other Ambulatory Visit (HOSPITAL_COMMUNITY): Payer: Medicare Other | Attending: Licensed Clinical Social Worker | Admitting: Licensed Clinical Social Worker

## 2022-08-06 DIAGNOSIS — F3162 Bipolar disorder, current episode mixed, moderate: Secondary | ICD-10-CM | POA: Insufficient documentation

## 2022-08-06 DIAGNOSIS — F411 Generalized anxiety disorder: Secondary | ICD-10-CM

## 2022-08-06 NOTE — Progress Notes (Signed)
Virtual Visit via Video Note   I connected with [name], who prefers to go by "Christine [name]" on 08/06/22 at  9:00 AM EDT by a video enabled telemedicine application and verified that I am speaking with the correct person using two identifiers.   At orientation to the IOP program, Case Manager discussed the limitations of evaluation and management by telemedicine and the availability of in person appointments. The patient expressed understanding and agreed to proceed with virtual visits throughout the duration of the program.   Location:  Patient: Patient Home Provider: [facility] [place] Office   History of Present Illness: Bipolar I Disorder and GAD   Observations/Objective: Check In: Case Manager checked in with all participants to review discharge dates, insurance authorizations, work-related documents and needs from the treatment team regarding medications. [name] stated needs and engaged in discussion.    Initial Therapeutic Activity: Counselor facilitated a check-in with Christine [name] to assess for safety, sobriety and medication compliance.  Counselor also inquired about Christine [name]'s current emotional ratings, as well as any significant changes in thoughts, feelings or behavior since previous check in.  Christine [name] presented for session on time and was alert, oriented x5, with no evidence or self-report of active SI/HI or A/V H.  Christine [name] reported compliance with medication and denied use of alcohol or illicit substances.  Christine [name] reported scores of 5/10 for depression, 6/10 for anxiety, and 0/10 for anger/irritability.  Christine [name] denied any recent outbursts or panic attacks.  [name] reported that a recent success was enjoying the holidays with her family, including visiting family over the past weekend.  [name] reported that a recent struggle was dealing with episodes of vertigo since Saturday, although she outreached her doctor and has medicine to pick up today to address this.  Christine [name] reported that her goal today is to take  time to rest after picking up her medication from the doctor.        Second Therapeutic Activity: Counselor introduced topic of goal setting today.  Counselor virtually shared a handout on SMART goals to help guide discussion.  This handout explained how using this acronym can help break large goals down into smaller, realistic steps which are more achievable, and went through each section (Specific, Measurable, Attainable, Relevant, and Time Bound) using open ended questions to guide discussion (i.e. What am I going to do?; How will I measure success?; What do I need to do to get started today?; etc).  Counselor tasked group members with identifying a primary goal to focus on during their treatment episode, using this acronym to clarify steps they can begin taking now, and consider obstacles that may arise, along with helpful tools and resources that could be utilized to increase chance of successful completion.  Intervention was effective, as evidenced by [name] participating in handout and reporting that her wellness goal for 2024 is to work on reducing overall depression.  Christine [name] reported that she will accomplish this by attending individual therapy once per week after stepping down from [place], write in a daily mood journal to increase insight into triggers which affect her mood, and become more open with her support network for assistance when struggling.  She reported that she hopes that these steps will help her reduce depression from 5/10 in daily severity down to 3/10 over the next days, and lead to an increase in self-esteem from 2/10 up to 5/10.  Christine [name] reported that she will consider linking with [facility] to continue with support groups if she cannot see her individual therapist  as frequently as planned.   Christine [name] stated "I think this goal is very worthwhile, and achieving it will help me improve my quality of life".    Assessment and Plan: Counselor recommends that [place] remain in IOP  treatment to better manage mental health symptoms, ensure stability and pursue completion of treatment plan goals. Counselor recommends adherence to crisis/safety plan, taking medications as prescribed, and following up with medical professionals if any issues arise.   Follow Up Instructions: Counselor will send Webex link for next session. [name] was advised to call back or seek an in-person evaluation if the symptoms worsen or if the condition fails to improve as anticipated.   Collaboration of Care:   Medication Management AEB Dr. Fatima Sanger or Ricky Ala, NP                                          Case Manager AEB Dellia Nims, CNA   Patient/Guardian was advised Release of Information must be obtained prior to any record release in order to collaborate their care with an outside provider. Patient/Guardian was advised if they have not already done so to contact the registration department to sign all necessary forms in order for [place] to release information regarding their care.   Consent: Patient/Guardian gives verbal consent for treatment and assignment of benefits for services provided during this visit. Patient/Guardian expressed understanding and agreed to proceed.  I provided 150 minutes of non-face-to-face time during this encounter.   Shade Flood, LCSW, LCAS 08/06/22

## 2022-08-07 ENCOUNTER — Other Ambulatory Visit (HOSPITAL_COMMUNITY): Payer: Medicare Other | Admitting: Licensed Clinical Social Worker

## 2022-08-07 DIAGNOSIS — F3162 Bipolar disorder, current episode mixed, moderate: Secondary | ICD-10-CM

## 2022-08-07 DIAGNOSIS — F411 Generalized anxiety disorder: Secondary | ICD-10-CM

## 2022-08-07 NOTE — Progress Notes (Signed)
Virtual Visit via Video Note   I connected with Christine [name], who prefers to go by "Christine [name]" on 08/07/22 at  9:00 AM EDT by a video enabled telemedicine application and verified that I am speaking with the correct person using two identifiers.   At orientation to the IOP program, Case Manager discussed the limitations of evaluation and management by telemedicine and the availability of in person appointments. The patient expressed understanding and agreed to proceed with virtual visits throughout the duration of the program.   Location:  Patient: Patient Home Provider: [facility] [place] [facility]   History of Present Illness: Bipolar I Disorder and GAD   Observations/Objective: Check In: Case Manager checked in with all participants to review discharge dates, insurance authorizations, work-related documents and needs from the treatment team regarding medications. Christine [name] stated needs and engaged in discussion.    Initial Therapeutic Activity: Counselor facilitated a check-in with Christine [name] to assess for safety, sobriety and medication compliance.  Counselor also inquired about Christine [name]'s current emotional ratings, as well as any significant changes in thoughts, feelings or behavior since previous check in.  Christine [name] presented for session on time and was alert, oriented x5, with no evidence or self-report of active SI/HI or A/V H.  Christine [name] reported compliance with medication and denied use of alcohol or illicit substances.  Christine [name] reported scores of 5/10 for depression, 6/10 for anxiety, and 3/10 for irritability.  Christine [name] denied any recent outbursts or panic attacks.  Christine [name] reported that an ongoing struggle has been dealing with vertigo over the past few days, which she feels has worsened since yesterday despite taking medication from her doctor to treat it.  Christine [name] reported that her goal today is to follow up with doctor again about lack of progress with vertigo.       Second Therapeutic Activity: Counselor introduced  [name], [employer] Pharmacist, to provide psychoeducation on topic of medication compliance with members today.  Jiles Garter provided psychoeducation on classes of medications such as antidepressants, antipsychotics, what symptoms they are intended to treat, and any side effects one might encounter while on a particular prescription.  Time was allowed for clients to ask any questions they might have of [facility] regarding this specialty.  Intervention was effective, as evidenced by [name] participating in discussion with speaker on the subject, reporting that she has been on many antidepressants in the past which were not helpful, but recently started gabapentin and was curious about whether this approach might lead to different results.  Christine [name] was receptive to feedback from pharmacist regarding the benefits of this medication, side effects, typical dosage, and importance of compliance with provider instructions. Christine [name] also inquired about pharmaceutical intervention for vertigo, and was receptive to feedback on this inquiry too.    Third Therapeutic Activity: Counselor introduced topic of grounding skills today.  Counselor defined these as simple strategies one can use to help detach from difficult thoughts or feelings temporarily by focusing on something else.  Counselor noted that grounding will not solve the problem at hand, but can provide the practitioner with time to regain control over their thoughts and/or feelings and prevent the situation from getting worse (i.e. interrupting a panic attack).  Counselor divided these into three categories (mental, physical, and soothing) and then provided examples of each which group members could practice during session.  Some of these included describing one's environment in detail or playing a categories game with oneself for mental category, taking a hot bath/shower, stretching, or carrying a grounding object for physical category,  and saying kind statements, or visualizing  people one cares about for soothing category.  Counselor inquired about which techniques members have used with success in the past, or will commit to learning, practicing, and applying now to improve coping abilities.  Intervention was effective, as evidenced by [name] participating in discussion on the subject, trying out several of the techniques during session, and expressing interest in adding several to her available coping skills, such as playing a categories game involving listing football teams, using her imagination to visualize being on a relaxing cruise from a past memory, reading an interesting book out loud like a mystery novel, finding something funny on TV that can make her laugh, splashing her skin with water, touching various objects around her, doing a stretching exercise, practicing her deep breathing, reciting positive coping statements to herself, looking at a picture of her family on their first cruise together, and planning for treats to look forward to, like grabbing coffee from her favorite place.    Assessment and Plan: Counselor recommends that [place] remain in IOP treatment to better manage mental health symptoms, ensure stability and pursue completion of treatment plan goals. Counselor recommends adherence to crisis/safety plan, taking medications as prescribed, and following up with medical professionals if any issues arise.   Follow Up Instructions: Counselor will send Webex link for next session. Christine [name] was advised to call back or seek an in-person evaluation if the symptoms worsen or if the condition fails to improve as anticipated.   Collaboration of Care:   Medication Management AEB Dr. Fatima Sanger or Ricky Ala, NP                                          Case Manager AEB Dellia Nims, CNA   Patient/Guardian was advised Release of Information must be obtained prior to any record release in order to collaborate their care with an outside provider. Patient/Guardian  was advised if they have not already done so to contact the registration department to sign all necessary forms in order for [place] to release information regarding their care.   Consent: Patient/Guardian gives verbal consent for treatment and assignment of benefits for services provided during this visit. Patient/Guardian expressed understanding and agreed to proceed.  I provided 180 minutes of non-face-to-face time during this encounter.   Shade Flood, [place], LCAS 08/07/22

## 2022-08-07 NOTE — Progress Notes (Signed)
Virtual Visit via Video Note  I connected with Christine [name] on 08/01/22 at  9:00 AM EST by a video enabled telemedicine application and verified that I am speaking with the correct person using two identifiers.  Location: Patient: patient home Provider: clinical home office   I discussed the limitations of evaluation and management by telemedicine and the availability of in person appointments. The patient expressed understanding and agreed to proceed.  I discussed the assessment and treatment plan with the patient. The patient was provided an opportunity to ask questions and all were answered. The patient agreed with the plan and demonstrated an understanding of the instructions.   The patient was advised to call back or seek an in-person evaluation if the symptoms worsen or if the condition fails to improve as anticipated.  I provided 180 minutes of non-face-to-face time during this encounter.   [name], [facility]   Daily Group Progress Note  Program: IOP  Group Time: 9:00 - 10:30  Participation Level: Active  Behavioral Response: Appropriate and Sharing  Type of Therapy:  Group Therapy  Summary of Progress: Clinician led check-in regarding current stressors and situation, and review of patient completed daily inventory. Clinician utilized active listening and empathetic response and validated patient emotions. Clinician facilitated processing group on pertinent issues.?    Patient arrived within time allowed. Patient rates her mood at a 4 on a scale of 1-10 with 10 being best. Pt states she feels "rok." Pt states she slept 6 hours and ate 2x. Pt reports her mood continues to be down due to feeling like a "failure" for having a dip in progress. Patient able to process. Patient engaged in discussion.     Progress Towards Goals: Progressing   Group Time: 10:30 - 12:00  Participation Level:  Active  Behavioral Response: Appropriate and Sharing  Type of Therapy: Group  Therapy  Summary of Progress: Cln led discussion on values. Group discussed why values and understanding personal values are important and why it is helpful to know what values/goals are most important to self.  Group discussed why certain values are more important to self than others. Pt identifies achieve things in life, be self-directed, and have integrity as top 3 values discussed.      Pt engaged in discussion and reports understanding.     Progress Towards Goals: [place], [facility]

## 2022-08-08 ENCOUNTER — Other Ambulatory Visit (HOSPITAL_COMMUNITY): Payer: Medicare Other | Admitting: Licensed Clinical Social Worker

## 2022-08-08 DIAGNOSIS — H8110 Benign paroxysmal vertigo, unspecified ear: Secondary | ICD-10-CM | POA: Diagnosis not present

## 2022-08-08 DIAGNOSIS — H6993 Unspecified Eustachian tube disorder, bilateral: Secondary | ICD-10-CM | POA: Diagnosis not present

## 2022-08-09 ENCOUNTER — Other Ambulatory Visit (HOSPITAL_COMMUNITY): Payer: Medicare Other | Admitting: Licensed Clinical Social Worker

## 2022-08-09 DIAGNOSIS — F411 Generalized anxiety disorder: Secondary | ICD-10-CM

## 2022-08-09 DIAGNOSIS — F3162 Bipolar disorder, current episode mixed, moderate: Secondary | ICD-10-CM | POA: Diagnosis not present

## 2022-08-09 NOTE — Progress Notes (Signed)
Virtual Visit via Video Note   I connected with Christine [name], who prefers to go by "Christine [name]" on 08/09/22 at  9:00 AM EDT by a video enabled telemedicine application and verified that I am speaking with the correct person using two identifiers.   At orientation to the IOP program, Case Manager discussed the limitations of evaluation and management by telemedicine and the availability of in person appointments. The patient expressed understanding and agreed to proceed with virtual visits throughout the duration of the program.   Location:  Patient: Patient Home Provider: [facility] [place] [facility]   History of Present Illness: Bipolar I Disorder and GAD   Observations/Objective: Check In: Case Manager checked in with all participants to review discharge dates, insurance authorizations, work-related documents and needs from the treatment team regarding medications. Christine [name] stated needs and engaged in discussion.    Initial Therapeutic Activity: Counselor facilitated a check-in with Christine [name] to assess for safety, sobriety and medication compliance.  Counselor also inquired about Christine [name]'s current emotional ratings, as well as any significant changes in thoughts, feelings or behavior since previous check in.  Christine [name] presented for session on time and was alert, oriented x5, with no evidence or self-report of active SI/HI or A/V H.  Christine [name] reported compliance with medication and denied use of alcohol or illicit substances.  Christine [name] reported scores of 6/10 for depression, 4/10 for anxiety, and 4/10 for anger/irritability.  Christine [name] denied any recent outbursts or panic attacks.  Christine [name] reported that an ongoing struggle has been dealing with vertigo, which led her to miss group yesterday in order to see her doctor.  She stated "I have a lot of fluid in my ears and there isn't much I can do but wait right now".  Christine [name] reported that her goal today is to take time to relax and avoid overwhelming herself, since she needs help getting  around the house without falling.       Second Therapeutic Activity: Counselor introduced [employer], [facility] Chaplain to provide psychoeducation on topic of Grief and Loss with members today.  Curt Bears began discussion by checking in with the group about their baseline mood today, general thoughts on what grief means to them and how it has affected them personally in the past.  Curt Bears provided information on how the process of grief/loss can differ depending upon one's unique culture, and categories of loss one could experience (i.e. loss of a person, animal, relationship, job, identity, etc).  Curt Bears encouraged members to be mindful of how pervasive loss can be, and how to recognize signs which could indicate that this is having an impact on one's overall mental health and wellbeing.  Intervention was effective, as evidenced by [name] participating in discussion with speaker on the subject, reporting that she initially worried about dealing with grief over the holidays, since both her parents are deceased, and she wondered how much she might ruminate on their memories.  Christine [name] reported that honestly opening up to her supports about how she was feeling during this time took some courage, but was rewarding, and will help her enjoy similar holidays in the future.    Third Therapeutic Activity: Counselor covered topic of attachment styles today.  Counselor virtually shared a handout with the group on this topic which defined attachment styles as how people think about and behave in relationships.  Styles were broken down by category, including secure attachment where one believes close relationships are trustworthy, compared to insecure attachment (i.e. anxious, avoidant, or anxious-avoidant) where one is distrusting  or worries about their bond with others.  Counselor inquired about which attachment style members most related to, how this has influenced their mental health/well-being, and whether they intend  to begin making any changes.  Intervention was effective, as evidenced by [name] participating in discussion, and reporting that she most identified with the secure attachment style due to traits such as feeling committed to her relationship, showing affection, attentiveness, and acceptance towards her partner, as well as showing good conflict resolution skills when problems arise.  Christine [name] reported that she had previously been in an unhealthy relationship which reinforced an anxious style, and her husband has helped to resolve previous issues with distrust.  She reported that due to her upbringing with a mother that tried to be very independent, it can be hard for her to ask for help sometimes, and this has been particularly difficult to reconcile with onset of vertigo over the past week.  Christine [name] reported that she plans to continue with therapy after completing [facility] next week in order to reinforce secure attachment traits, voice her needs more honestly to her husband, and ensure quality support during the recovery process.    Assessment and Plan: Counselor recommends that [place] remain in IOP treatment to better manage mental health symptoms, ensure stability and pursue completion of treatment plan goals. Counselor recommends adherence to crisis/safety plan, taking medications as prescribed, and following up with medical professionals if any issues arise.   Follow Up Instructions: Counselor will send Webex link for next session. Christine [name] was advised to call back or seek an in-person evaluation if the symptoms worsen or if the condition fails to improve as anticipated.   Collaboration of Care:   Medication Management AEB Dr. Fatima Sanger or Ricky Ala, NP                                          Case Manager AEB Dellia Nims, CNA   Patient/Guardian was advised Release of Information must be obtained prior to any record release in order to collaborate their care with an outside provider. Patient/Guardian  was advised if they have not already done so to contact the registration department to sign all necessary forms in order for [place] to release information regarding their care.   Consent: Patient/Guardian gives verbal consent for treatment and assignment of benefits for services provided during this visit. Patient/Guardian expressed understanding and agreed to proceed.  I provided 180 minutes of non-face-to-face time during this encounter.   Shade Flood, LCSW, LCAS 08/09/22

## 2022-08-10 ENCOUNTER — Emergency Department (HOSPITAL_COMMUNITY)
Admission: EM | Admit: 2022-08-10 | Discharge: 2022-08-10 | Disposition: A | Discharge status: Home / Self Care | Payer: Medicare Other | Attending: Emergency Medicine | Admitting: Emergency Medicine

## 2022-08-10 DIAGNOSIS — T162XXA Foreign body in left ear, initial encounter: Secondary | ICD-10-CM | POA: Diagnosis not present

## 2022-08-10 DIAGNOSIS — X58XXXA Exposure to other specified factors, initial encounter: Secondary | ICD-10-CM | POA: Diagnosis not present

## 2022-08-10 NOTE — Discharge Instructions (Signed)
Return for any problem.  ?

## 2022-08-10 NOTE — ED Provider Notes (Signed)
[facility] DEPT Provider Note   CSN: [id number] Arrival date & time: 08/10/22  9030     History  Chief Complaint  Patient presents with   Foreign Body in Christine [name] is a 47 y.o. female.  47 year old female with prior medical history as detailed below presents for evaluation.  Patient reports that she was cleaning her ears with a Q-tip this morning.  Cotton from the Q-tip became stuck and lodged in the left ear canal.  She requests removal of cotton ear.  The history is provided by the patient and medical records.       Home Medications Prior to Admission medications   Medication Sig Start Date End Date Taking? Authorizing Provider  acetaminophen (TYLENOL) 325 MG tablet Take 650 mg by mouth every 6 (six) hours as needed for mild pain or headache.    [provider]  amLODipine (NORVASC) 5 MG tablet Take 1 tablet (5 mg total) by mouth daily. 08/10/19 08/09/20  Christine Boroughs, MD  CAPLYTA 42 MG capsule Take 42 mg by mouth daily. 06/14/22   [provider]  chlorthalidone (HYGROTON) 25 MG tablet TAKE 1/2 TABLET(12.5 MG) BY MOUTH DAILY 05/29/22   [name], MD  clonazePAM (KLONOPIN) 0.5 MG tablet Take 1 tablet (0.5 mg total) by mouth daily as needed for anxiety. 08/13/21   [name], [name], MD  fluticasone (FLONASE) 50 MCG/ACT nasal spray 1 spray in each nostril    [provider]  gabapentin (NEURONTIN) 100 MG capsule Take 100 mg by mouth 3 (three) times daily. 06/14/22   [provider]  lamoTRIgine (LAMICTAL) 150 MG tablet Take 1 tablet (150 mg total) by mouth 2 (two) times daily. For mood 08/13/21   [name], [name], MD  lithium carbonate (LITHOBID) 300 MG CR tablet Take 2 tablets (600 mg total) by mouth 2 (two) times daily. Take 2 tablets (600 mg) every morning and 1 tablet (300 mg) at night Patient taking differently: Take 600 mg by mouth 2 (two) times daily. Take 2 tablets (600 mg) every morning and 1  tablet (600 mg) at night 07/04/22   [name], MD  metFORMIN (GLUCOPHAGE) 500 MG tablet Take 1 tablet (500 mg total) by mouth 2 (two) times daily with a meal. 10/20/19   [name], MD  metoprolol succinate (TOPROL-XL) 100 MG 24 hr tablet TAKE 1 TABLET BY MOUTH DAILY WITH OR IMMEDIATELY FOLLOWING A MEAL 05/29/22   [name], MD  Vitamin D, Ergocalciferol, (DRISDOL) 1.25 MG (50000 UNIT) CAPS capsule Take 1 capsule (50,000 Units total) by mouth every 7 (seven) days. 05/23/22   [name], MD      Allergies    Penicillins    Review of Systems   Review of Systems  All other systems reviewed and are negative.   Physical Exam Updated Vital Signs BP (!) 182/76   Pulse 99   Temp 98.2 F (36.8 C) (Oral)   Resp 16   LMP 07/25/2022 (Exact Date)   SpO2 98%  Physical Exam Vitals and nursing note reviewed.  Constitutional:      General: She is not in acute distress.    Appearance: Normal appearance. She is well-developed.  HENT:     Head: Normocephalic and atraumatic.     Right Ear: Tympanic membrane normal.     Left Ear: Tympanic membrane normal.     Ears:     Comments: Small amount of cotton from cotton  Q-tip lodged in left ear canal.  This was removed without difficulty.  Patient tolerated well. Eyes:     Conjunctiva/sclera: Conjunctivae normal.     Pupils: Pupils are equal, round, and reactive to light.  Cardiovascular:     Rate and Rhythm: Normal rate and regular rhythm.     Heart sounds: Normal heart sounds.  Pulmonary:     Effort: Pulmonary effort is normal. No respiratory distress.     Breath sounds: Normal breath sounds.  Abdominal:     General: There is no distension.     Palpations: Abdomen is soft.     Tenderness: There is no abdominal tenderness.  Musculoskeletal:        General: No deformity. Normal range of motion.     Cervical back: Normal range of motion and neck supple.  Skin:    General: Skin is warm and dry.  Neurological:      General: No focal deficit present.     Mental Status: She is alert and oriented to person, place, and time.     ED Results / Procedures / Treatments   Labs (all labs ordered are listed, but only abnormal results are displayed) Labs Reviewed - No data to display  EKG None  Radiology No results found.  Procedures .Foreign Body Removal  Date/Time: 08/10/2022 7:50 AM  Performed by: Valarie Merino, MD Authorized by: Valarie Merino, MD  Consent: Verbal consent obtained. Written consent obtained. Risks and benefits: risks, benefits and alternatives were discussed Imaging studies: imaging studies available Patient identity confirmed: verbally with patient, hospital-assigned identification number and anonymous protocol, patient vented/unresponsive Time out: Immediately prior to procedure a "time out" was called to verify the correct patient, procedure, equipment, support staff and site/side marked as required. Body area: ear Location details: left ear  Sedation: Patient sedated: no  Patient restrained: no Patient cooperative: yes Localization method: visualized Removal mechanism: alligator forceps Complexity: simple 1 objects recovered. Objects recovered: cotton from qtip Post-procedure assessment: foreign body removed Patient tolerance: patient tolerated the procedure well with no immediate complications      Medications Ordered in ED Medications - No data to display  ED Course/ Medical Decision Making/ A&P                           Medical Decision Making   Medical Screen Complete  This patient presented to the ED with complaint of retained cotton from Q-tip in left ear canal.  This complaint involves an extensive number of treatment options. The initial differential diagnosis includes, but is not limited to, foreign body in left ear canal  This presentation is: Acute and Self-Limited  Patient with cotton from Q-tip in left ear canal.  Cotton removed  without difficulty.  Patient tolerated well.    Patient is advised to refrain from using cotton Q-tips for the next 2 weeks.  Importance of close follow-up is stressed.  Strict return precautions given and understood.  Additional history obtained: External records from outside sources obtained and reviewed including prior ED visits and prior Inpatient records.    Problem List / ED Course:  Foreign body in left ear canal   Reevaluation:  After the interventions noted above, I reevaluated the patient and found that they have: improved  Disposition:  After consideration of the diagnostic results and the patients response to treatment, I feel that the patent would benefit from close outpatient followup.  Final Clinical Impression(s) / ED Diagnoses Final diagnoses:  Foreign body of left ear, initial encounter    Rx / DC Orders ED Discharge Orders     None         Valarie Merino, MD 08/10/22 [phone]

## 2022-08-10 NOTE — ED Triage Notes (Signed)
Pt reports a piece of cotton swab in left ear. Cotton is visualized with otoscope.

## 2022-08-12 ENCOUNTER — Other Ambulatory Visit (HOSPITAL_COMMUNITY): Payer: Medicare Other | Admitting: Psychiatry

## 2022-08-12 DIAGNOSIS — I10 Essential (primary) hypertension: Secondary | ICD-10-CM | POA: Diagnosis not present

## 2022-08-12 DIAGNOSIS — D509 Iron deficiency anemia, unspecified: Secondary | ICD-10-CM | POA: Diagnosis not present

## 2022-08-12 DIAGNOSIS — F411 Generalized anxiety disorder: Secondary | ICD-10-CM

## 2022-08-12 DIAGNOSIS — E785 Hyperlipidemia, unspecified: Secondary | ICD-10-CM | POA: Diagnosis not present

## 2022-08-12 DIAGNOSIS — R7303 Prediabetes: Secondary | ICD-10-CM | POA: Diagnosis not present

## 2022-08-12 DIAGNOSIS — F3162 Bipolar disorder, current episode mixed, moderate: Secondary | ICD-10-CM

## 2022-08-12 DIAGNOSIS — E559 Vitamin D deficiency, unspecified: Secondary | ICD-10-CM | POA: Diagnosis not present
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
## 2022-08-13 ENCOUNTER — Other Ambulatory Visit (HOSPITAL_COMMUNITY): Payer: Medicare Other | Admitting: Psychiatry

## 2022-08-13 DIAGNOSIS — F3162 Bipolar disorder, current episode mixed, moderate: Secondary | ICD-10-CM

## 2022-08-13 MED ORDER — LITHIUM CARBONATE ER 300 MG PO TBCR: 600.0000 mg | EXTENDED_RELEASE_TABLET | Freq: Two times a day (BID) | ORAL | 0 refills | Status: AC
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
## 2022-08-14 ENCOUNTER — Other Ambulatory Visit (HOSPITAL_COMMUNITY): Payer: Medicare Other | Admitting: Licensed Clinical Social Worker

## 2022-08-14 DIAGNOSIS — F3162 Bipolar disorder, current episode mixed, moderate: Secondary | ICD-10-CM | POA: Diagnosis not present

## 2022-08-14 DIAGNOSIS — F411 Generalized anxiety disorder: Secondary | ICD-10-CM

## 2022-08-14 NOTE — Patient Instructions (Signed)
D:  Patient completed MH-IOP today.  A:  D/C today.  Follow up with [facility] tomorrow at 11 a.m.; pt will call Ok Anis, NP for an appt.  Encouraged support groups.  R:  Pt receptive.
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
## 2022-08-15 ENCOUNTER — Other Ambulatory Visit (HOSPITAL_COMMUNITY): Payer: Medicare Other | Admitting: Licensed Clinical Social Worker

## 2022-08-15 DIAGNOSIS — F319 Bipolar disorder, unspecified: Secondary | ICD-10-CM | POA: Diagnosis not present

## 2022-08-16 ENCOUNTER — Other Ambulatory Visit (HOSPITAL_COMMUNITY): Payer: Medicare Other | Admitting: Licensed Clinical Social Worker

## 2022-08-22 DIAGNOSIS — F319 Bipolar disorder, unspecified: Secondary | ICD-10-CM | POA: Diagnosis not present

## 2022-08-26 ENCOUNTER — Other Ambulatory Visit: Payer: Self-pay | Admitting: Cardiology

## 2022-08-28 ENCOUNTER — Other Ambulatory Visit: Payer: Self-pay

## 2022-08-28 DIAGNOSIS — D500 Iron deficiency anemia secondary to blood loss (chronic): Secondary | ICD-10-CM

## 2022-08-29 ENCOUNTER — Inpatient Hospital Stay: Payer: Medicare Other

## 2022-08-29 ENCOUNTER — Inpatient Hospital Stay: Payer: Medicare Other | Attending: Nurse Practitioner

## 2022-08-29 VITALS — BP 113/61 | HR 85 | Temp 98.7°F | Resp 18

## 2022-08-29 DIAGNOSIS — Z79899 Other long term (current) drug therapy: Secondary | ICD-10-CM | POA: Insufficient documentation

## 2022-08-29 DIAGNOSIS — D500 Iron deficiency anemia secondary to blood loss (chronic): Secondary | ICD-10-CM

## 2022-08-29 DIAGNOSIS — D509 Iron deficiency anemia, unspecified: Secondary | ICD-10-CM | POA: Diagnosis not present

## 2022-08-29 LAB — CBC WITH DIFFERENTIAL (CANCER CENTER ONLY)
Abs Immature Granulocytes: 0.07 10*3/uL (ref 0.00–0.07)
Basophils Absolute: 0 10*3/uL (ref 0.0–0.1)
Basophils Relative: 0 %
Eosinophils Absolute: 0.2 10*3/uL (ref 0.0–0.5)
Eosinophils Relative: 2 %
HCT: 31 % — ABNORMAL LOW (ref 36.0–46.0)
Hemoglobin: 9.3 g/dL — ABNORMAL LOW (ref 12.0–15.0)
Immature Granulocytes: 1 %
Lymphocytes Relative: 16 %
Lymphs Abs: 1.6 10*3/uL (ref 0.7–4.0)
MCH: 23.5 pg — ABNORMAL LOW (ref 26.0–34.0)
MCHC: 30 g/dL (ref 30.0–36.0)
MCV: 78.5 fL — ABNORMAL LOW (ref 80.0–100.0)
Monocytes Absolute: 0.8 10*3/uL (ref 0.1–1.0)
Monocytes Relative: 9 %
Neutro Abs: 7.2 10*3/uL (ref 1.7–7.7)
Neutrophils Relative %: 72 %
Platelet Count: 432 10*3/uL — ABNORMAL HIGH (ref 150–400)
RBC: 3.95 MIL/uL (ref 3.87–5.11)
RDW: 19.3 % — ABNORMAL HIGH (ref 11.5–15.5)
WBC Count: 9.9 10*3/uL (ref 4.0–10.5)
nRBC: 0.2 % (ref 0.0–0.2)

## 2022-08-29 LAB — IRON AND IRON BINDING CAPACITY (CC-WL,HP ONLY)
Iron: 18 ug/dL — ABNORMAL LOW (ref 28–170)
Saturation Ratios: 4 % — ABNORMAL LOW (ref 10.4–31.8)
TIBC: 421 ug/dL (ref 250–450)
UIBC: 403 ug/dL (ref 148–442)

## 2022-08-29 LAB — FERRITIN: Ferritin: 3 ng/mL — ABNORMAL LOW (ref 11–307)

## 2022-08-29 MED ORDER — SODIUM CHLORIDE 0.9 % IV SOLN
300.0000 mg | Freq: Once | INTRAVENOUS | Status: AC
  Administered 2022-08-29: 300 mg via INTRAVENOUS
  Filled 2022-08-29: qty 300

## 2022-08-29 MED ORDER — SODIUM CHLORIDE 0.9 % IV SOLN: Freq: Once | INTRAVENOUS | Status: AC

## 2022-08-29 NOTE — Patient Instructions (Signed)
Iron Sucrose Injection What is this medication? IRON SUCROSE (EYE ern SOO krose) treats low levels of iron (iron deficiency anemia) in people with kidney disease. Iron is a mineral that plays an important role in making red blood cells, which carry oxygen from your lungs to the rest of your body. This medicine may be used for other purposes; ask your health care provider or pharmacist if you have questions. COMMON BRAND NAME(S): Venofer What should I tell my care team before I take this medication? They need to know if you have any of these conditions: Anemia not caused by low iron levels Heart disease High levels of iron in the blood Kidney disease Liver disease An unusual or allergic reaction to iron, other medications, foods, dyes, or preservatives Pregnant or trying to get pregnant Breastfeeding How should I use this medication? This medication is for infusion into a vein. It is given in a hospital or clinic setting. Talk to your care team about the use of this medication in children. While this medication may be prescribed for children as young as 2 years for selected conditions, precautions do apply. Overdosage: If you think you have taken too much of this medicine contact a poison control center or emergency room at once. NOTE: This medicine is only for you. Do not share this medicine with others. What if I miss a dose? Keep appointments for follow-up doses. It is important not to miss your dose. Call your care team if you are unable to keep an appointment. What may interact with this medication? Do not take this medication with any of the following: Deferoxamine Dimercaprol Other iron products This medication may also interact with the following: Chloramphenicol Deferasirox This list may not describe all possible interactions. Give your health care provider a list of all the medicines, herbs, non-prescription drugs, or dietary supplements you use. Also tell them if you smoke,  drink alcohol, or use illegal drugs. Some items may interact with your medicine. What should I watch for while using this medication? Visit your care team regularly. Tell your care team if your symptoms do not start to get better or if they get worse. You may need blood work done while you are taking this medication. You may need to follow a special diet. Talk to your care team. Foods that contain iron include: whole grains/cereals, dried fruits, beans, or peas, leafy green vegetables, and organ meats (liver, kidney). What side effects may I notice from receiving this medication? Side effects that you should report to your care team as soon as possible: Allergic reactions--skin rash, itching, hives, swelling of the face, lips, tongue, or throat Low blood pressure--dizziness, feeling faint or lightheaded, blurry vision Shortness of breath Side effects that usually do not require medical attention (report to your care team if they continue or are bothersome): Flushing Headache Joint pain Muscle pain Nausea Pain, redness, or irritation at injection site This list may not describe all possible side effects. Call your doctor for medical advice about side effects. You may report side effects to FDA at 1-800-FDA-1088. Where should I keep my medication? This medication is given in a hospital or clinic and will not be stored at home. NOTE: This sheet is a summary. It may not cover all possible information. If you have questions about this medicine, talk to your doctor, pharmacist, or health care provider.  2023 Elsevier/Gold Standard (2020-11-02 00:00:00)

## 2022-08-29 NOTE — Progress Notes (Signed)
It is a requirement in the [facility] program

## 2022-08-30 ENCOUNTER — Encounter: Payer: Self-pay | Admitting: Nurse Practitioner

## 2022-08-30 DIAGNOSIS — F32A Depression, unspecified: Secondary | ICD-10-CM | POA: Diagnosis not present

## 2022-09-01 ENCOUNTER — Other Ambulatory Visit: Payer: Self-pay | Admitting: Hematology

## 2022-09-02 ENCOUNTER — Other Ambulatory Visit: Payer: Self-pay | Admitting: Nurse Practitioner

## 2022-09-02 DIAGNOSIS — D500 Iron deficiency anemia secondary to blood loss (chronic): Secondary | ICD-10-CM

## 2022-09-05 DIAGNOSIS — F319 Bipolar disorder, unspecified: Secondary | ICD-10-CM | POA: Diagnosis not present

## 2022-09-05 DIAGNOSIS — J0140 Acute pansinusitis, unspecified: Secondary | ICD-10-CM | POA: Diagnosis not present

## 2022-09-06 ENCOUNTER — Encounter: Payer: Self-pay | Admitting: Gastroenterology

## 2022-09-06 DIAGNOSIS — F3131 Bipolar disorder, current episode depressed, mild: Secondary | ICD-10-CM | POA: Diagnosis not present

## 2022-09-06 DIAGNOSIS — F411 Generalized anxiety disorder: Secondary | ICD-10-CM | POA: Diagnosis not present

## 2022-09-10 ENCOUNTER — Inpatient Hospital Stay: Payer: Medicare Other | Attending: Nurse Practitioner

## 2022-09-10 ENCOUNTER — Other Ambulatory Visit: Payer: Self-pay

## 2022-09-10 VITALS — BP 125/63 | HR 75 | Temp 99.1°F | Resp 18

## 2022-09-10 DIAGNOSIS — Z79899 Other long term (current) drug therapy: Secondary | ICD-10-CM | POA: Diagnosis not present

## 2022-09-10 DIAGNOSIS — D500 Iron deficiency anemia secondary to blood loss (chronic): Secondary | ICD-10-CM

## 2022-09-10 DIAGNOSIS — D509 Iron deficiency anemia, unspecified: Secondary | ICD-10-CM | POA: Insufficient documentation

## 2022-09-10 MED ORDER — SODIUM CHLORIDE 0.9 % IV SOLN: Freq: Once | INTRAVENOUS | Status: AC

## 2022-09-10 MED ORDER — SODIUM CHLORIDE 0.9 % IV SOLN
300.0000 mg | Freq: Once | INTRAVENOUS | Status: AC
  Administered 2022-09-10: 300 mg via INTRAVENOUS
  Filled 2022-09-10: qty 300

## 2022-09-10 NOTE — Progress Notes (Signed)
Pt declined 30 min post iron obs. VSS at discharge.

## 2022-09-10 NOTE — Patient Instructions (Signed)

## 2022-09-11 ENCOUNTER — Encounter (INDEPENDENT_AMBULATORY_CARE_PROVIDER_SITE_OTHER): Payer: Self-pay | Admitting: Family Medicine

## 2022-09-11 ENCOUNTER — Ambulatory Visit (INDEPENDENT_AMBULATORY_CARE_PROVIDER_SITE_OTHER): Payer: Medicare Other | Admitting: Family Medicine

## 2022-09-11 NOTE — Telephone Encounter (Signed)
Please call pt to reschedule since she is sick

## 2022-09-12 DIAGNOSIS — F319 Bipolar disorder, unspecified: Secondary | ICD-10-CM | POA: Diagnosis not present

## 2022-09-15 ENCOUNTER — Emergency Department (HOSPITAL_COMMUNITY)
Admission: EM | Admit: 2022-09-15 | Discharge: 2022-09-15 | Disposition: A | Discharge status: Home / Self Care | Payer: Medicare Other | Attending: Emergency Medicine | Admitting: Emergency Medicine

## 2022-09-15 ENCOUNTER — Other Ambulatory Visit: Payer: Self-pay

## 2022-09-15 DIAGNOSIS — I509 Heart failure, unspecified: Secondary | ICD-10-CM | POA: Insufficient documentation

## 2022-09-15 DIAGNOSIS — N939 Abnormal uterine and vaginal bleeding, unspecified: Secondary | ICD-10-CM | POA: Diagnosis not present

## 2022-09-15 DIAGNOSIS — R011 Cardiac murmur, unspecified: Secondary | ICD-10-CM | POA: Diagnosis not present

## 2022-09-15 LAB — CBC WITH DIFFERENTIAL/PLATELET
Abs Immature Granulocytes: 0.11 10*3/uL — ABNORMAL HIGH (ref 0.00–0.07)
Basophils Absolute: 0 10*3/uL (ref 0.0–0.1)
Basophils Relative: 0 %
Eosinophils Absolute: 0.1 10*3/uL (ref 0.0–0.5)
Eosinophils Relative: 1 %
HCT: 32.6 % — ABNORMAL LOW (ref 36.0–46.0)
Hemoglobin: 9.3 g/dL — ABNORMAL LOW (ref 12.0–15.0)
Immature Granulocytes: 1 %
Lymphocytes Relative: 18 %
Lymphs Abs: 1.8 10*3/uL (ref 0.7–4.0)
MCH: 24.1 pg — ABNORMAL LOW (ref 26.0–34.0)
MCHC: 28.5 g/dL — ABNORMAL LOW (ref 30.0–36.0)
MCV: 84.5 fL (ref 80.0–100.0)
Monocytes Absolute: 0.9 10*3/uL (ref 0.1–1.0)
Monocytes Relative: 8 %
Neutro Abs: 7.4 10*3/uL (ref 1.7–7.7)
Neutrophils Relative %: 72 %
Platelets: 380 10*3/uL (ref 150–400)
RBC: 3.86 MIL/uL — ABNORMAL LOW (ref 3.87–5.11)
RDW: 22.2 % — ABNORMAL HIGH (ref 11.5–15.5)
WBC: 10.3 10*3/uL (ref 4.0–10.5)
nRBC: 0 % (ref 0.0–0.2)

## 2022-09-15 LAB — BASIC METABOLIC PANEL
Anion gap: 10 (ref 5–15)
BUN: 12 mg/dL (ref 6–20)
CO2: 25 mmol/L (ref 22–32)
Calcium: 9.3 mg/dL (ref 8.9–10.3)
Chloride: 106 mmol/L (ref 98–111)
Creatinine, Ser: 0.83 mg/dL (ref 0.44–1.00)
GFR, Estimated: >60 mL/min (ref >60)
Glucose, Bld: 101 mg/dL — ABNORMAL HIGH (ref 70–99)
Potassium: 3.3 mmol/L — ABNORMAL LOW (ref 3.5–5.1)
Sodium: 141 mmol/L (ref 135–145)

## 2022-09-15 LAB — TYPE AND SCREEN
ABO/RH(D): O POS
Antibody Screen: NEGATIVE

## 2022-09-15 LAB — I-STAT BETA HCG BLOOD, ED (MC, WL, AP ONLY): I-stat hCG, quantitative: <5 m[IU]/mL (ref <5)

## 2022-09-15 NOTE — ED Provider Notes (Signed)
[place] EMERGENCY DEPARTMENT AT [facility] Provider Note   CSN: [id number] Arrival date & time: 09/15/22  [id number]     History  Chief Complaint  Patient presents with   Vaginal Bleeding    Christine [name] is a 47 y.o. female.   Vaginal Bleeding    This is a 46 female with history of CHF, fibroids, anemia with iron transfusions presented to the emergency department due to vaginal bleeding.  Her menstrual cycle started 3 days ago, she is using more blood than typical during menstrual cycles.  She denies any abdominal pain or cramping or pelvic pain, no vaginal discharge or concern for STDs.  She states she also had blood this morning in the bathroom and felt lightheaded, denies any syncopal events.  She has no history of clotting disorders, not on any oral birth control, denies any previous vaginal deliveries of pregnancy status post right ovarian/dermoid cyst removal.  Home Medications Prior to Admission medications   Medication Sig Start Date End Date Taking? Authorizing Provider  acetaminophen (TYLENOL) 325 MG tablet Take 650 mg by mouth every 6 (six) hours as needed for mild pain or headache.    [provider]  amLODipine (NORVASC) 5 MG tablet Take 1 tablet (5 mg total) by mouth daily. 08/10/19 08/09/20  Jennye Boroughs, MD  CAPLYTA 42 MG capsule Take 42 mg by mouth daily. 06/14/22   [provider]  chlorthalidone (HYGROTON) 25 MG tablet TAKE 1/2 TABLET(12.5 MG) BY MOUTH DAILY 08/28/22   [name], MD  clonazePAM (KLONOPIN) 0.5 MG tablet Take 1 tablet (0.5 mg total) by mouth daily as needed for anxiety. 08/13/21   [name], [name], MD  fluticasone (FLONASE) 50 MCG/ACT nasal spray 1 spray in each nostril    [provider]  gabapentin (NEURONTIN) 100 MG capsule Take 100 mg by mouth 3 (three) times daily. 06/14/22   [provider]  lamoTRIgine (LAMICTAL) 150 MG tablet Take 1 tablet (150 mg total) by mouth 2 (two) times daily. For mood  08/13/21   [name], [name], MD  lithium carbonate (LITHOBID) 300 MG ER tablet Take 2 tablets (600 mg total) by mouth 2 (two) times daily. 08/13/22   [name], MD  metFORMIN (GLUCOPHAGE) 500 MG tablet Take 1 tablet (500 mg total) by mouth 2 (two) times daily with a meal. 10/20/19   [name], MD  metoprolol succinate (TOPROL-XL) 100 MG 24 hr tablet TAKE 1 TABLET BY MOUTH DAILY WITH OR IMMEDIATELY FOLLOWING A MEAL 05/29/22   [name], MD  Vitamin D, Ergocalciferol, (DRISDOL) 1.25 MG (50000 UNIT) CAPS capsule Take 1 capsule (50,000 Units total) by mouth every 7 (seven) days. 05/23/22   [name], MD      Allergies    Penicillins    Review of Systems   Review of Systems  Genitourinary:  Positive for vaginal bleeding.    Physical Exam Updated Vital Signs BP (!) 149/88 (BP Location: Left Arm)   Pulse 75   Temp 99 F (37.2 C) (Oral)   Resp 18   Ht 5' 4"$  (1.626 m)   Wt (!) 147.4 kg   LMP 09/13/2022 (Exact Date)   SpO2 100%   BMI 55.79 kg/m  Physical Exam Vitals and nursing note reviewed. Exam conducted with a chaperone present.  Constitutional:      Appearance: Normal appearance. She is obese.  HENT:     Head: Normocephalic and atraumatic.  Eyes:     General:  No scleral icterus.       Right eye: No discharge.        Left eye: No discharge.     Extraocular Movements: Extraocular movements intact.     Pupils: Pupils are equal, round, and reactive to light.  Cardiovascular:     Rate and Rhythm: Normal rate and regular rhythm.     Pulses: Normal pulses.     Heart sounds: Murmur heard.     No friction rub. No gallop.  Pulmonary:     Effort: Pulmonary effort is normal. No respiratory distress.     Breath sounds: Normal breath sounds.  Abdominal:     General: Abdomen is flat. Bowel sounds are normal.     Palpations: Abdomen is soft.     Tenderness: There is no abdominal tenderness.     Comments: Abdomen is soft, nontender  Skin:    General: Skin  is warm and dry.     Coloration: Skin is not jaundiced.  Neurological:     Mental Status: She is alert. Mental status is at baseline.     Coordination: Coordination normal.     ED Results / Procedures / Treatments   Labs (all labs ordered are listed, but only abnormal results are displayed) Labs Reviewed  CBC WITH DIFFERENTIAL/PLATELET - Abnormal; Notable for the following components:      Result Value   RBC 3.86 (*)    Hemoglobin 9.3 (*)    HCT 32.6 (*)    MCH 24.1 (*)    MCHC 28.5 (*)    RDW 22.2 (*)    All other components within normal limits  BASIC METABOLIC PANEL  I-STAT BETA HCG BLOOD, ED ([facility], [facility], [facility] ONLY)  TYPE AND SCREEN    EKG EKG Interpretation  Date/Time:  Sunday September 15 2022 07:31:52 EST Ventricular Rate:  70 PR Interval:  172 QRS Duration: 85 QT Interval:  397 QTC Calculation: 429 R Axis:   59 Text Interpretation: Sinus rhythm No acute changes No significant change since last tracing Confirmed by Varney Biles [id number]) on 09/15/2022 8:12:02 AM  Radiology No results found.  Procedures Procedures    Medications Ordered in ED Medications - No data to display  ED Course/ Medical Decision Making/ A&P Clinical Course as of 09/15/22 0825  Sun Sep 15, 2022  [id number] CBC with Differential(!) No leukocytosis, hemoglobin is unchanged compared to 2 weeks ago at 9.3 [HS]  0801 I-Stat beta hCG blood, ED Not pregnant, not a ruptured ectopic [HS]  [id number] Basic metabolic panel(!) Mildly hypokalemic with a potassium of 3.3, no gross electrolyte derangement [HS]  0824 ED EKG EKG shows sinus rhythm, patient is in sinus rhythm on cardiac monitoring in the room per my interpretation [HS]  0824 I reevaluated the patient who is resting comfortably, denies any new symptoms.  We discussed the workup including her CBC [HS]    Clinical Course User Index [HS] Sherrill Raring, PA-C                             Medical Decision Making Amount and/or Complexity of Data  Reviewed Labs: ordered. Decision-making details documented in ED Course. ECG/medicine tests:  Decision-making details documented in ED Course.   This is a 47 year old female presenting to the emergency department due to vaginal bleeding.  Differential includes but not limited to symptomatic anemia, ectopic pregnancy, hemorrhagic shock, coagulopathy, fibroids.  She has no history of coagulopathies, also denies any chance  she could be pregnant although we will check a pregnancy test as well as basic labs and type and screen.  Hide physician for symptomatic anemia given baseline very anemic with iron transfusions.  Her last hemoglobin was drawn 2 weeks ago and was 9.3 (08/29/22).    Hemoglobin  Date Value Ref Range Status  09/15/2022 9.3 (L) 12.0 - 15.0 g/dL Final  08/29/2022 9.3 (L) 12.0 - 15.0 g/dL Final    Comment:    Reticulocyte Hemoglobin testing may be clinically indicated, consider ordering this additional test [id number]   05/23/2022 8.2 (L) 12.0 - 15.0 g/dL Final    Comment:    Reticulocyte Hemoglobin testing may be clinically indicated, consider ordering this additional test [id number]   05/02/2022 7.6 (L) 12.0 - 15.0 g/dL Final    Comment:    Reticulocyte Hemoglobin testing may be clinically indicated, consider ordering this additional test [id number]   05/02/2022 7.2 (A) 12.0 - 16.0 Final  05/21/2021 10.5 (L) 12.0 - 15.0 g/dL Final  02/26/2021 11.7 (L) 12.0 - 15.0 g/dL Final  09/28/2020 9.2 (L) 11.1 - 15.9 g/dL Final     Given patient is not having any abdominal pain, pelvic pain and has no STD risk factors or concerns we will defer pelvic exam at this time.  I ordered and reviewed interpreted laboratory workup as documented in ED course.  There is no gross electrolyte derangement, underlying arrhythmia, renal impairment.    I do think she is likely having symptomatic anemia in the setting of vaginal bleeding, does not require a transfusion however.  I discussed return  precautions with the patient, she will follow-up with her OB/GYN.    I considered pelvic exam and vaginal ultrasound but given she is is not having any pain, has history of this menorrhagia and symptomatic anemia previously I do not really feel strongly is indicated on an emergent setting.         Final Clinical Impression(s) / ED Diagnoses Final diagnoses:  None    Rx / DC Orders ED Discharge Orders     None         Sherrill Raring, PA-C 09/15/22 [place], MD 09/18/22 1515

## 2022-09-15 NOTE — Discharge Instructions (Signed)
Workup today was reassuring.  Your hemoglobin is 9.3, the rest of your laboratory workup is unremarkable.  I do think the lightheadedness is from menstrual cycle.  If you start having abdominal pain, loss of consciousness, significant weakness return back to ED for additional evaluation, otherwise follow-up with your primary this week for reevaluation.

## 2022-09-15 NOTE — ED Triage Notes (Signed)
Pt presents to ED from home C/O heavy vaginal bleeding, dizziness, lightheadedness. Pt reports she is on day 3 of menstrual cycle. Hx fibroids, heavy bleeding with menstruation. Pt reports that she receives iron transfusions for anemia frequently.

## 2022-09-17 ENCOUNTER — Other Ambulatory Visit: Payer: Self-pay

## 2022-09-17 ENCOUNTER — Inpatient Hospital Stay: Payer: Medicare Other

## 2022-09-17 VITALS — BP 126/64 | HR 64 | Temp 98.4°F | Resp 18

## 2022-09-17 DIAGNOSIS — D500 Iron deficiency anemia secondary to blood loss (chronic): Secondary | ICD-10-CM

## 2022-09-17 DIAGNOSIS — D509 Iron deficiency anemia, unspecified: Secondary | ICD-10-CM | POA: Diagnosis not present

## 2022-09-17 DIAGNOSIS — Z79899 Other long term (current) drug therapy: Secondary | ICD-10-CM | POA: Diagnosis not present

## 2022-09-17 MED ORDER — SODIUM CHLORIDE 0.9 % IV SOLN
300.0000 mg | Freq: Once | INTRAVENOUS | Status: AC
  Administered 2022-09-17: 300 mg via INTRAVENOUS
  Filled 2022-09-17: qty 300

## 2022-09-17 MED ORDER — SODIUM CHLORIDE 0.9 % IV SOLN: Freq: Once | INTRAVENOUS | Status: AC

## 2022-09-24 ENCOUNTER — Inpatient Hospital Stay: Payer: Medicare Other

## 2022-09-27 DIAGNOSIS — F319 Bipolar disorder, unspecified: Secondary | ICD-10-CM | POA: Diagnosis not present

## 2022-09-30 ENCOUNTER — Other Ambulatory Visit: Payer: Self-pay

## 2022-09-30 ENCOUNTER — Inpatient Hospital Stay: Payer: Medicare Other

## 2022-09-30 VITALS — BP 135/66 | HR 76 | Temp 98.9°F | Resp 20

## 2022-09-30 DIAGNOSIS — Z79899 Other long term (current) drug therapy: Secondary | ICD-10-CM | POA: Diagnosis not present

## 2022-09-30 DIAGNOSIS — D500 Iron deficiency anemia secondary to blood loss (chronic): Secondary | ICD-10-CM

## 2022-09-30 DIAGNOSIS — D509 Iron deficiency anemia, unspecified: Secondary | ICD-10-CM | POA: Diagnosis not present

## 2022-09-30 MED ORDER — SODIUM CHLORIDE 0.9 % IV SOLN
300.0000 mg | Freq: Once | INTRAVENOUS | Status: AC
  Administered 2022-09-30: 300 mg via INTRAVENOUS
  Filled 2022-09-30: qty 200

## 2022-09-30 MED ORDER — SODIUM CHLORIDE 0.9 % IV SOLN: Freq: Once | INTRAVENOUS | Status: AC

## 2022-09-30 NOTE — Progress Notes (Signed)
Pt. declined to stay for 30 minute post observation. Vital signs stable, left via ambulation, no shortness of breath noted.

## 2022-09-30 NOTE — Patient Instructions (Signed)

## 2022-10-04 DIAGNOSIS — F319 Bipolar disorder, unspecified: Secondary | ICD-10-CM | POA: Diagnosis not present

## 2022-10-07 DIAGNOSIS — F411 Generalized anxiety disorder: Secondary | ICD-10-CM | POA: Diagnosis not present

## 2022-10-07 DIAGNOSIS — F3131 Bipolar disorder, current episode depressed, mild: Secondary | ICD-10-CM | POA: Diagnosis not present

## 2022-10-08 ENCOUNTER — Encounter: Payer: Self-pay | Admitting: Gastroenterology

## 2022-10-08 ENCOUNTER — Ambulatory Visit (INDEPENDENT_AMBULATORY_CARE_PROVIDER_SITE_OTHER): Payer: Medicare Other | Admitting: Gastroenterology

## 2022-10-08 VITALS — BP 138/80 | HR 95 | Ht 64.0 in | Wt 330.0 lb

## 2022-10-08 DIAGNOSIS — E559 Vitamin D deficiency, unspecified: Secondary | ICD-10-CM

## 2022-10-08 DIAGNOSIS — E538 Deficiency of other specified B group vitamins: Secondary | ICD-10-CM | POA: Diagnosis not present

## 2022-10-08 DIAGNOSIS — D509 Iron deficiency anemia, unspecified: Secondary | ICD-10-CM

## 2022-10-08 MED ORDER — VITAMIN D-3 125 MCG (5000 UT) PO TABS: ORAL_TABLET | ORAL | 0 refills | Status: DC

## 2022-10-08 MED ORDER — VITAMIN B-12 5000 MCG PO TBDP: ORAL_TABLET | ORAL | Status: DC

## 2022-10-08 MED ORDER — NA SULFATE-K SULFATE-MG SULF 17.5-3.13-1.6 GM/177ML PO SOLN: 1.0000 | Freq: Once | ORAL | 0 refills | Status: AC

## 2022-10-08 NOTE — Patient Instructions (Addendum)
You have been scheduled for an endoscopy and colonoscopy. Please follow the written instructions given to you at your visit today. Please pick up your prep supplies at the pharmacy within the next 1-3 days. If you use inhalers (even only as needed), please bring them with you on the day of your procedure.  You have a telephone nurse visit to review instructions for your procedure at [facility] on Monday 5-13 at 8:30 am.  Please start over the counter Vitamin B12 5,000 mcg daily and  Vitamin D 5,000 IU daily  We have sent the following medications to your pharmacy for you to pick up at your convenience: Suprep   If your blood pressure at your visit was 140/90 or greater, please contact your primary care physician to follow up on this.  _______________________________________________________  If you are age 46 or older, your body mass index should be between 23-30. Your Body mass index is 56.64 kg/m. If this is out of the aforementioned range listed, please consider follow up with your Primary Care Provider.  If you are age 46 or younger, your body mass index should be between 19-25. Your Body mass index is 56.64 kg/m. If this is out of the aformentioned range listed, please consider follow up with your Primary Care Provider.   ________________________________________________________  The [facility] providers would like to encourage you to use [facility] to communicate with providers for non-urgent requests or questions.  Due to long hold times on the telephone, sending your provider a message by [facility] may be a faster and more efficient way to get a response.  Please allow 48 business hours for a response.  Please remember that this is for non-urgent requests.  _______________________________________________________  Due to recent changes in healthcare laws, you may see the results of your imaging and laboratory studies on MyChart before your provider has had a chance to review them.  We  understand that in some cases there may be results that are confusing or concerning to you. Not all laboratory results come back in the same time frame and the provider may be waiting for multiple results in order to interpret others.  Please give [place] 48 hours in order for your provider to thoroughly review all the results before contacting the office for clarification of your results.   Thank you for entrusting me with your care and for choosing [employer], Alonza Bogus, P.A. - C.

## 2022-10-08 NOTE — Progress Notes (Addendum)
10/08/2022 Kaiana Digiandomenico [id number] 30-Sep-1975   HISTORY OF PRESENT ILLNESS: This is a 47 year old female who is new to our office.  She has been referred here by hematology for evaluation of iron deficiency anemia.  She says that she has had longstanding iron deficiency anemia that has been attributed to menorrhagia.  This has been going on for years and she has received blood transfusions in the past.  She does not tolerate oral iron very well.  She has been receiving IV iron infusions.  She is now 68 so they wanted her to proceed with GI evaluation.  She had a colonoscopy over 10 years ago with Dr. Benson Norway she believes probably in 2008 or 2011.  She has never had an upper endoscopy.  She denies any black or bloody stools.  She reports having about 2-3 bowel movements a day, usually 1 of those bowel movements is loose, and she kind of attributes that to the metformin.  Her B12 levels are actually borderline low at 191 on the last check.  She said that her vitamin D levels were low in the past also and she had taken 50,000 units for a while.  She has appointment with hematology again in April at which time they are planning to do more labs then.  Does not use any NSAIDs.  Family history of colon cancer in her father diagnosed initially in his mid 33s.   Past Medical History:  Diagnosis Date   Anemia    Presumably from menorrhagia/menomenorrhagia; has had both blood and iron transfusions   Anxiety    Back pain    Bipolar 1 disorder (HCC)    With depression   Chest pain    Depression    Diastolic heart failure ([place])    Essential hypertension    Fibromyalgia    Headache    History of blood transfusion 08/2017   [facility]   History of hiatal hernia    IBS (irritable bowel syndrome)    Infertility, female    Infertility, female    Lactose intolerance    Lower extremity edema    Migraines    Morbid obesity (HCC)    Osteoarthritis    Hands, ankles; HLA-B27 positive   PCOS (polycystic  ovarian syndrome)    Prediabetes    Seasonal allergies    With recurrent allergic rhinitis   Sleep apnea    SOB (shortness of breath)    Uterine leiomyoma    Vitamin D deficiency    Past Surgical History:  Procedure Laterality Date   CARDIAC CT ANGIOGRAM  03/2019    Ca Score 0. Normal Coronary origin R dominant. No evidence of CAD. ? Liver nodules - poorly visualized (consider MRI w & w/o Gad contrast)   CERVICAL POLYPECTOMY  01/15/2018   Procedure: CERVICAL POLYPECTOMY;  Surgeon: Arvella Nigh, MD;  Location: [facility];  Service: Gynecology;;   COLONOSCOPY  2008   Normal   DIAGNOSTIC LAPAROSCOPY  08/1999   dermoid cyst, RSO   DILATION AND CURETTAGE OF UTERUS  05/2004   MAB   DILATION AND CURETTAGE OF UTERUS  04/2015   HYSTEROSCOPY WITH D & C  07/22/2011   Procedure: DILATATION AND CURETTAGE /HYSTEROSCOPY;  Surgeon: Cyril Mourning, MD;  Location: [facility];  Service: Gynecology;;   HYSTEROSCOPY WITH D & C N/A 01/15/2018   Procedure: DILATATION AND CURETTAGE [place] WITH MYOSURE;  Surgeon: Arvella Nigh, MD;  Location: [facility];  Service: Gynecology;  Laterality: N/A;   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  Suggestion of high cardiac output (possibly related to anemia thyrotoxicosis, Pregnancy, sepsis etc.) -- was in setting of symptomatic anemia - Hgb 5.7    reports that she has never smoked. She has never used smokeless tobacco. She reports current alcohol use. She reports that she does not use drugs. family history includes Anxiety disorder in her mother; Cancer (age of onset: 42) in her brother; Colon cancer in her father; Depression in her mother; Diabetes in her father; Heart disease in her father; High blood pressure in her brother, father, and mother; Hyperlipidemia in her father and mother; Hypertension in her father and mother; Kidney disease in her father; Obesity in her father; Pancreatic  cancer in her mother; Sleep apnea in her father; Stroke in her father. Allergies  Allergen Reactions   Penicillins Rash and Other (See Comments)    Has patient had a PCN reaction causing immediate rash, facial/tongue/throat swelling, SOB or lightheadedness with hypotension: Yes Has patient had a PCN reaction causing severe rash involving mucus membranes or skin necrosis: Unknown Has patient had a PCN reaction that required hospitalization: No Has patient had a PCN reaction occurring within the last 10 years: No If all of the above answers are "NO", then may proceed with Cephalosporin use.       Outpatient Encounter Medications as of 10/08/2022  Medication Sig   acetaminophen (TYLENOL) 325 MG tablet Take 650 mg by mouth every 6 (six) hours as needed for mild pain or headache.   amLODipine (NORVASC) 5 MG tablet Take 1 tablet (5 mg total) by mouth daily.   CAPLYTA 42 MG capsule Take 42 mg by mouth daily.   chlorthalidone (HYGROTON) 25 MG tablet TAKE 1/2 TABLET(12.5 MG) BY MOUTH DAILY   Cholecalciferol (VITAMIN D-3) 125 MCG (5000 UT) TABS Take once daily   clonazePAM (KLONOPIN) 0.5 MG tablet Take 1 tablet (0.5 mg total) by mouth daily as needed for anxiety.   Cyanocobalamin (VITAMIN B-12) 5000 MCG TBDP Take once daily   fluticasone (FLONASE) 50 MCG/ACT nasal spray 1 spray in each nostril   gabapentin (NEURONTIN) 100 MG capsule Take 100 mg by mouth 3 (three) times daily.   lamoTRIgine (LAMICTAL) 150 MG tablet Take 1 tablet (150 mg total) by mouth 2 (two) times daily. For mood   lithium carbonate (LITHOBID) 300 MG ER tablet Take 2 tablets (600 mg total) by mouth 2 (two) times daily.   metFORMIN (GLUCOPHAGE) 500 MG tablet Take 1 tablet (500 mg total) by mouth 2 (two) times daily with a meal.   metoprolol succinate (TOPROL-XL) 100 MG 24 hr tablet TAKE 1 TABLET BY MOUTH DAILY WITH OR IMMEDIATELY FOLLOWING A MEAL   Na Sulfate-K Sulfate-Mg Sulf 17.5-3.13-1.6 GM/177ML SOLN Take 1 kit by mouth once for  1 dose.   [DISCONTINUED] Vitamin D, Ergocalciferol, (DRISDOL) 1.25 MG (50000 UNIT) CAPS capsule Take 1 capsule (50,000 Units total) by mouth every 7 (seven) days.   No facility-administered encounter medications on file as of 10/08/2022.    REVIEW OF SYSTEMS  : All other systems reviewed and negative except where noted in the History of Present Illness.   PHYSICAL EXAM: BP 138/80   Pulse 95   Ht '5\' 4"'$  (1.626 m)   Wt (!) 330 lb (149.7 kg)   LMP 09/13/2022 (Exact Date)   BMI 56.64 kg/m  General: Well developed AA female in no  acute distress Head: Normocephalic and atraumatic Eyes:  Sclerae anicteric, conjunctiva pink. Ears: Normal auditory acuity Lungs: Clear throughout to auscultation; no W/R/R. Heart: Regular rate and rhythm; no M/R/G. Abdomen: Soft, non-distended.  BS present.  Non-tender.  Umbilical hernia noted and also seems to have a ventral hernia as well. Rectal:  Will be done at the time of colonoscopy. Musculoskeletal: Symmetrical with no gross deformities  Skin: No lesions on visible extremities Extremities: No edema  Neurological: Alert oriented x 4, grossly non-focal Psychological:  Alert and cooperative. Normal mood and affect  ASSESSMENT AND PLAN: *IDA: Receiving IV iron infusions and following with hematology who has referred her here.  Last colonoscopy over 10 years ago.  Never had an endoscopy.  Will plan for EGD and colonoscopy with Dr. Lyndel Safe at [facility] due to BMI greater than 50.  The risks, benefits, and alternatives to EGD and colonoscopy were discussed with the patient and she consents to proceed.  *Vitamin B12 deficiency: Will start by 5000 mcg supplement OTC for now. *Vitamin D deficiency: Will start 5000 units OTC for now.  She will discuss both of these things with hematology when she sees him again in April. *Family history of colon cancer in her father:  Diagnosed in his mid-42s. *Loose stools:  Dicussed that she could try Benefiber 2 tsp in  8 ounces of liquid daily to try to helping with bulking.   CC:  Alla Feeling, NP

## 2022-10-11 DIAGNOSIS — F319 Bipolar disorder, unspecified: Secondary | ICD-10-CM | POA: Diagnosis not present

## 2022-10-12 NOTE — Progress Notes (Signed)
Agree with assessment/plan.  Raj Klyde Banka, MD [facility] [phone]  

## 2022-10-14 DIAGNOSIS — F411 Generalized anxiety disorder: Secondary | ICD-10-CM | POA: Diagnosis not present

## 2022-10-14 DIAGNOSIS — F3131 Bipolar disorder, current episode depressed, mild: Secondary | ICD-10-CM | POA: Diagnosis not present

## 2022-10-18 DIAGNOSIS — F319 Bipolar disorder, unspecified: Secondary | ICD-10-CM | POA: Diagnosis not present

## 2022-10-22 DIAGNOSIS — H6993 Unspecified Eustachian tube disorder, bilateral: Secondary | ICD-10-CM | POA: Diagnosis not present

## 2022-10-22 DIAGNOSIS — J309 Allergic rhinitis, unspecified: Secondary | ICD-10-CM | POA: Diagnosis not present

## 2022-10-22 DIAGNOSIS — J3489 Other specified disorders of nose and nasal sinuses: Secondary | ICD-10-CM | POA: Diagnosis not present

## 2022-10-25 DIAGNOSIS — F319 Bipolar disorder, unspecified: Secondary | ICD-10-CM | POA: Diagnosis not present

## 2022-11-08 ENCOUNTER — Other Ambulatory Visit: Payer: Self-pay | Admitting: Cardiology

## 2022-11-08 DIAGNOSIS — F319 Bipolar disorder, unspecified: Secondary | ICD-10-CM | POA: Diagnosis not present

## 2022-11-13 DIAGNOSIS — F411 Generalized anxiety disorder: Secondary | ICD-10-CM | POA: Diagnosis not present

## 2022-11-13 DIAGNOSIS — F3131 Bipolar disorder, current episode depressed, mild: Secondary | ICD-10-CM | POA: Diagnosis not present

## 2022-11-15 DIAGNOSIS — F319 Bipolar disorder, unspecified: Secondary | ICD-10-CM | POA: Diagnosis not present

## 2022-11-19 DIAGNOSIS — Z1151 Encounter for screening for human papillomavirus (HPV): Secondary | ICD-10-CM | POA: Diagnosis not present

## 2022-11-19 DIAGNOSIS — Z124 Encounter for screening for malignant neoplasm of cervix: Secondary | ICD-10-CM | POA: Diagnosis not present

## 2022-11-22 DIAGNOSIS — F319 Bipolar disorder, unspecified: Secondary | ICD-10-CM | POA: Diagnosis not present

## 2022-11-25 ENCOUNTER — Other Ambulatory Visit: Payer: Self-pay | Admitting: Surgery

## 2022-11-25 DIAGNOSIS — K420 Umbilical hernia with obstruction, without gangrene: Secondary | ICD-10-CM | POA: Diagnosis not present

## 2022-11-26 ENCOUNTER — Telehealth: Payer: Self-pay | Admitting: Nurse Practitioner

## 2022-11-27 ENCOUNTER — Encounter: Payer: Self-pay | Admitting: Hematology

## 2022-11-27 ENCOUNTER — Ambulatory Visit (HOSPITAL_COMMUNITY)
Admission: EM | Admit: 2022-11-27 | Discharge: 2022-11-27 | Disposition: A | Discharge status: Home / Self Care | Payer: Medicare Other | Attending: Family | Admitting: Family

## 2022-11-27 DIAGNOSIS — F314 Bipolar disorder, current episode depressed, severe, without psychotic features: Secondary | ICD-10-CM | POA: Diagnosis not present

## 2022-11-27 DIAGNOSIS — Z79899 Other long term (current) drug therapy: Secondary | ICD-10-CM | POA: Diagnosis not present

## 2022-11-27 DIAGNOSIS — R4588 Nonsuicidal self-harm: Secondary | ICD-10-CM | POA: Insufficient documentation

## 2022-11-27 DIAGNOSIS — S50912A Unspecified superficial injury of left forearm, initial encounter: Secondary | ICD-10-CM | POA: Diagnosis not present

## 2022-11-27 DIAGNOSIS — F411 Generalized anxiety disorder: Secondary | ICD-10-CM | POA: Insufficient documentation

## 2022-11-27 DIAGNOSIS — X789XXA Intentional self-harm by unspecified sharp object, initial encounter: Secondary | ICD-10-CM | POA: Insufficient documentation

## 2022-11-27 DIAGNOSIS — F319 Bipolar disorder, unspecified: Secondary | ICD-10-CM | POA: Diagnosis not present

## 2022-11-27 NOTE — ED Provider Notes (Signed)
Behavioral Health Urgent Care Medical Screening Exam  Patient Name: Christine [name] MRN: [id number] Date of Evaluation: 11/27/22 Chief Complaint:   Diagnosis:  Final diagnoses:  Bipolar disorder, current episode depressed, severe, without psychotic features    History of Present illness: Christine [name] is a 47 y.o. female. Patient presents voluntarily to [facility] behavioral health for walk-in assessment.  Patient encouraged to seek crisis assessment by outpatient individual counseling provider.  Patient reports she engaged in nonsuicidal self-harm behavior by cutting on yesterday.  Prior to yesterday had abstained from cutting for 2 years.  Patient exhibits multiple superficial scratches to left anterior forearm.  Scratches are clean and dry, open to air, no evidence of inflammation.    Patient is assessed, face-to-face, by nurse practitioner. She is seated in assessment area, no acute distress. Consulted with provider, Dr.  Lucianne Muss, and chart reviewed on 11/27/2022. She  is alert and oriented, pleasant and cooperative during assessment.   Patient  presents with depressed mood, tearful affect. She  denies suicidal and homicidal ideations.  She she denies history of previous suicide attempts.  Reports she has had multiple episodes of suicidal ideations, has never followed through with a plan to end her life.  Patient states "I would never do that."  Patient easily  contracts verbally for safety with this Clinical research associate.  Christine [name] has been diagnosed with bipolar 1 disorder and generalized anxiety disorder.  She is followed by outpatient psychiatry.  Meets with medication management provider, Quitman Livings, [facility], monthly.  Current medications include Lamictal 400 mg daily, lithium 600 mg twice daily and she recently initiated Caplyta.  She is compliant with her medications.  Upcoming appointment on 12/04/2022.  She meets with [facility] surgeon, [employer], weekly.  She recently  completed PHP and IOP programs through [facility] behavioral health outpatient, programs ended in January 2024.  She endorses history of multiple previous inpatient psychiatric hospitalizations.  Most recent hospitalization at [facility] behavioral health in February 2020.  No family mental health history reported.  Patient reports recent stressors include her job.  Patient works part-time in a work from home position, 10 hours/week.  She has difficulty feeling motivated to complete work projects.  Chronic stressors include bipolar disorder.  Patient states "I have been on meds since I was age 4, they are always changing something, it gets frustrating."    Patient has normal speech and behavior.  She  denies auditory and visual hallucinations.  Patient is able to converse coherently with goal-directed thoughts and no distractibility or preoccupation.  Denies symptoms of paranoia.  Objectively there is no evidence of psychosis/mania or delusional thinking.  Christine [name] in [place] with her husband and 2 daughters ages 49 and 2.  She denies access to weapons.  She is employed part-time and receives disability income.  Patient endorses average sleep and appetite.  She denies alcohol and substance use.  Patient offered support and encouragement.  Reviewed options including IOP and PHP.  She declines these options.  Offered continuous observation admission, patient also declines.  She reports readiness to discharge home.  Patient declines any person to contact for collateral information.  Patient's husband is aware of her visit today.   Patient educated and verbalizes understanding of mental health resources and other crisis services in the community. They are instructed to call 911 and present to the nearest emergency room should patient experience any suicidal/homicidal ideation, auditory/visual/hallucinations, or detrimental worsening of mental health condition.      Flowsheet Row ED from 09/15/2022  in [facility]  Emergency Department at [facility] ED from 08/10/2022 in [facility] Emergency Department at [facility] Counselor from 07/22/2022 in BEHAVIORAL HEALTH INTENSIVE PSYCH  C-SSRS RISK CATEGORY No Risk No Risk Error: Question 6 not populated       Psychiatric Specialty Exam  Presentation  General Appearance:Appropriate for Environment; Casual  Eye Contact:Good  Speech:Clear and Coherent; Normal Rate  Speech Volume:Normal  Handedness:Right   Mood and Affect  Mood: Depressed  Affect: Appropriate; Congruent   Thought Process  Thought Processes: Coherent; Goal Directed; Linear  Descriptions of Associations:Intact  Orientation:Full (Time, Place and Person)  Thought Content:Logical; WDL  Diagnosis of Schizophrenia or Schizoaffective disorder in past: No   Hallucinations:None  Ideas of Reference:None  Suicidal Thoughts:No  Homicidal Thoughts:No   Sensorium  Memory: Immediate Good; Recent Good  Judgment: Good  Insight: Good   Executive Functions  Concentration: Good  Attention Span: Good  Recall: Good  Fund of Knowledge: Good  Language: Good   Psychomotor Activity  Psychomotor Activity: Normal   Assets  Assets: Communication Skills; Desire for Improvement; Financial Resources/Insurance; Housing; Physical Health; Resilience; Social Support   Sleep  Sleep: Good  Number of hours: No data recorded  Physical Exam: Physical Exam Vitals and nursing note reviewed.  Constitutional:      Appearance: Normal appearance. She is well-developed.  HENT:     Head: Normocephalic and atraumatic.     Nose: Nose normal.  Cardiovascular:     Rate and Rhythm: Normal rate.  Pulmonary:     Effort: Pulmonary effort is normal.  Musculoskeletal:        General: Normal range of motion.     Cervical back: Normal range of motion.  Skin:    General: Skin is warm and dry.     Comments: Multiple superficial scratches to left anterior  forearm  Neurological:     Mental Status: She is alert and oriented to person, place, and time.  Psychiatric:        Attention and Perception: Attention and perception normal.        Mood and Affect: Mood is depressed. Affect is tearful.        Speech: Speech normal.        Behavior: Behavior normal. Behavior is cooperative.        Thought Content: Thought content normal.        Cognition and Memory: Cognition and memory normal.    Review of Systems  Constitutional: Negative.   HENT: Negative.    Eyes: Negative.   Respiratory: Negative.    Cardiovascular: Negative.   Gastrointestinal: Negative.   Genitourinary: Negative.   Musculoskeletal: Negative.   Skin: Negative.   Neurological: Negative.   Psychiatric/Behavioral:  Positive for depression.    Blood pressure (!) 154/69, pulse 94, temperature 98.6 F (37 C), temperature source Oral, resp. rate 19, SpO2 100 %. There is no height or weight on file to calculate BMI.  Musculoskeletal: Strength & Muscle Tone: within normal limits Gait & Station: normal Patient leans: N/A   [facility] MSE Discharge Disposition for Follow up and Recommendations: Based on my evaluation the patient does not appear to have an emergency medical condition and can be discharged with resources and follow up care in outpatient services for Medication Management and Individual Therapy Follow-up with established outpatient psychiatry for medication management.  Follow-up with established outpatient counseling provider.  Continue current medications.  Lenard Lance, FNP 11/27/2022, 4:06 PM

## 2022-11-27 NOTE — Progress Notes (Signed)
   11/27/22 1500  [facility] Triage Screening (Walk-ins at [facility] only)  How Did You Hear About [place]? Self  What Is the Reason for Your Visit/Call Today? Patient is a 47 y.o. female with a hx of Bipolar Disorder who presents voluntarily at the recommendation of her therapist after a virtual visit today. During her session, patient shares she has been struggling with a current depressive episode she has been experiencing for the past month.  She shared that she cut yesterday to "release pressure/tension."  She denies suicidal intent or current SI. She does report feeling like "leaving or disappearing" sometimes.  Patient is followed by Kerin Salen, NP of [facility] for med management.  Patient states her provider recently increased her Lamictal dosage from 300 to  QD two weeks ago.  Patient is also seeing Georgeanne Nim of [facility] weekly for therapy.  Patient is frustrated as she reports she just "put myself through" 7 weeks of IOP treatment, starting with PHP for 4 wks and IOP for 3 weeks, ending in January.  She states she feels defeated, as she was doing well and is already experiencing another episode.  She is hoping to speak with a provider about her current medication regimen, in case there are recommendations with dosages.  She is not interested in inpatient treatment at this time and is able to affirm her safety.  She denies HI, AVH or SA hx.  How Long Has This Been Causing You Problems? 1 wk - 1 month  Have You Recently Had Any Thoughts About Hurting Yourself? No  Are You Planning to Commit Suicide/Harm Yourself At This time? No  Have you Recently Had Thoughts About Hurting Someone Karolee Ohs? No  Are You Planning To Harm Someone At This Time? No  Are you currently experiencing any auditory, visual or other hallucinations? No  Have You Used Any Alcohol or Drugs in the Past 24 Hours? No  Do you have any current medical co-morbidities that require immediate attention? No  Clinician description of  patient physical appearance/behavior: Patient presents with mildly pressured speech, hyperverbal, appropriate, cooperative AAOx5  What Do You Feel Would Help You the Most Today? Treatment for Depression or other mood problem  If access to [facility] was not available, would you have sought care in the Emergency Department? No  Determination of Need Routine (7 days)  Options For Referral Medication Management

## 2022-11-27 NOTE — Discharge Instructions (Addendum)

## 2022-11-28 ENCOUNTER — Inpatient Hospital Stay: Payer: Medicare Other

## 2022-11-28 ENCOUNTER — Inpatient Hospital Stay: Payer: Medicare Other | Admitting: Nurse Practitioner

## 2022-11-28 DIAGNOSIS — F411 Generalized anxiety disorder: Secondary | ICD-10-CM | POA: Diagnosis not present

## 2022-11-28 DIAGNOSIS — F3132 Bipolar disorder, current episode depressed, moderate: Secondary | ICD-10-CM | POA: Diagnosis not present

## 2022-11-29 ENCOUNTER — Encounter: Payer: Self-pay | Admitting: Hematology

## 2022-11-29 ENCOUNTER — Telehealth: Payer: Self-pay

## 2022-11-29 NOTE — Telephone Encounter (Signed)
   Pre-operative Risk Assessment    Patient Name: Christine [name]  DOB: Mar 12, 1976 MRN: [id number]      Request for Surgical Clearance    Procedure:   Hernia Repair  Date of Surgery:  Clearance TBD                                 Surgeon:  Georga Bora Surgeon's Group or Practice Name:  [facility] Phone number:  [phone] Fax number:  [phone]   Type of Clearance Requested:   - Medical    Type of Anesthesia:  General    Additional requests/questions:  Please fax a copy of clearance  to the surgeon's office.  Signed, Donneta Romberg   11/29/2022, 7:55 AM

## 2022-11-29 NOTE — Telephone Encounter (Signed)
   Name: Christine [name]  DOB: 12/15/1975  MRN: [id number]  Primary Cardiologist: Bryan Lemma, MD  Chart reviewed as part of pre-operative protocol coverage. Because of Christine [name]'s past medical history and time since last visit, she will require a follow-up in-office visit in order to better assess preoperative cardiovascular risk.  Pre-op covering staff: - Please schedule appointment and call patient to inform them. If patient already had an upcoming appointment within acceptable timeframe, please add "pre-op clearance" to the appointment notes so provider is aware. - Please contact requesting surgeon's office via preferred method (i.e, phone, fax) to inform them of need for appointment prior to surgery.  Joylene Grapes, NP  11/29/2022, 1:15 PM

## 2022-11-29 NOTE — Telephone Encounter (Signed)
1st attempt to reach pt regarding surgical clearance and the need for an in office appointment.  Pt actually is scheduled to see Joni Reining 01/03/23.  If pt is wanting surgery before then, it will need to be moved up.  Left pt a detailed message to call back to reschedule.

## 2022-12-02 NOTE — Telephone Encounter (Addendum)
Left detailed message on patient and her spouse voicemail requesting a callback if the patient would like a sooner appointment for cardiac clearance. Advised that the patient has an appt on 01/03/23 and she can be cleared at that time however if she wants to have her surgery prior then she can contact the office for a sooner appointment. Requested a call back with any questions or concerns.

## 2022-12-04 ENCOUNTER — Other Ambulatory Visit: Payer: Self-pay

## 2022-12-04 ENCOUNTER — Other Ambulatory Visit: Payer: Self-pay | Admitting: Nurse Practitioner

## 2022-12-04 ENCOUNTER — Encounter: Payer: Self-pay | Admitting: Nurse Practitioner

## 2022-12-04 ENCOUNTER — Inpatient Hospital Stay: Payer: Medicare Other | Attending: Nurse Practitioner

## 2022-12-04 ENCOUNTER — Inpatient Hospital Stay: Payer: Medicare Other

## 2022-12-04 ENCOUNTER — Inpatient Hospital Stay (HOSPITAL_BASED_OUTPATIENT_CLINIC_OR_DEPARTMENT_OTHER): Payer: Medicare Other | Admitting: Nurse Practitioner

## 2022-12-04 VITALS — BP 126/63 | HR 72 | Resp 17

## 2022-12-04 VITALS — BP 138/73 | HR 88 | Temp 99.2°F | Resp 18 | Wt 323.7 lb

## 2022-12-04 DIAGNOSIS — D500 Iron deficiency anemia secondary to blood loss (chronic): Secondary | ICD-10-CM

## 2022-12-04 DIAGNOSIS — Z79899 Other long term (current) drug therapy: Secondary | ICD-10-CM | POA: Diagnosis not present

## 2022-12-04 DIAGNOSIS — N920 Excessive and frequent menstruation with regular cycle: Secondary | ICD-10-CM | POA: Insufficient documentation

## 2022-12-04 DIAGNOSIS — F319 Bipolar disorder, unspecified: Secondary | ICD-10-CM | POA: Insufficient documentation

## 2022-12-04 LAB — CBC WITH DIFFERENTIAL (CANCER CENTER ONLY)
Abs Immature Granulocytes: 0.07 10*3/uL (ref 0.00–0.07)
Basophils Absolute: 0 10*3/uL (ref 0.0–0.1)
Basophils Relative: 1 %
Eosinophils Absolute: 0.1 10*3/uL (ref 0.0–0.5)
Eosinophils Relative: 1 %
HCT: 35.7 % — ABNORMAL LOW (ref 36.0–46.0)
Hemoglobin: 11.1 g/dL — ABNORMAL LOW (ref 12.0–15.0)
Immature Granulocytes: 1 %
Lymphocytes Relative: 14 %
Lymphs Abs: 1.3 10*3/uL (ref 0.7–4.0)
MCH: 27.8 pg (ref 26.0–34.0)
MCHC: 31.1 g/dL (ref 30.0–36.0)
MCV: 89.3 fL (ref 80.0–100.0)
Monocytes Absolute: 0.6 10*3/uL (ref 0.1–1.0)
Monocytes Relative: 7 %
Neutro Abs: 6.8 10*3/uL (ref 1.7–7.7)
Neutrophils Relative %: 76 %
Platelet Count: 369 10*3/uL (ref 150–400)
RBC: 4 MIL/uL (ref 3.87–5.11)
RDW: 16.5 % — ABNORMAL HIGH (ref 11.5–15.5)
WBC Count: 8.9 10*3/uL (ref 4.0–10.5)
nRBC: 0 % (ref 0.0–0.2)

## 2022-12-04 LAB — IRON AND IRON BINDING CAPACITY (CC-WL,HP ONLY)
Iron: 31 ug/dL (ref 28–170)
Saturation Ratios: 7 % — ABNORMAL LOW (ref 10.4–31.8)
TIBC: 419 ug/dL (ref 250–450)
UIBC: 388 ug/dL (ref 148–442)

## 2022-12-04 LAB — FERRITIN: Ferritin: 11 ng/mL (ref 11–307)

## 2022-12-04 MED ORDER — SODIUM CHLORIDE 0.9 % IV SOLN
300.0000 mg | Freq: Once | INTRAVENOUS | Status: AC
  Administered 2022-12-04: 300 mg via INTRAVENOUS
  Filled 2022-12-04: qty 300

## 2022-12-04 MED ORDER — SODIUM CHLORIDE 0.9 % IV SOLN: Freq: Once | INTRAVENOUS | Status: AC

## 2022-12-04 NOTE — Patient Instructions (Signed)

## 2022-12-04 NOTE — Progress Notes (Signed)
Pt declined to stay for 30 min post obs, discharged with VSS, ambulatory to lobby ?

## 2022-12-04 NOTE — Progress Notes (Signed)
Patient Care Team: Carilyn Goodpasture, NP as PCP - General (Family Medicine) Marykay Lex, MD as PCP - Cardiology (Cardiology)   CHIEF COMPLAINT: Follow up IDA  CURRENT THERAPY: IV iron as needed, previously Feraheme now Venofer, last given 06/01/2021 and 06/09/2021; she does not tolerate oral iron   INTERVAL HISTORY Ms. Reining returns for follow up as scheduled. Last seen by me 05/23/22. SHe received IV Iron in November, January, and February last dose 09/30/22. Her fatigue has improved significantly. Still having heavy menstrual bleeding but a little less. Under new Gyn who is working her up for possible procedure to help fibroids. Also starting progestin to manage bleeding. Denies other bleeding. Scheduled for colonoscopy 5/30 by Dr. Chales Abrahams. Her mental health has been bad lately, engaged in self harm and went to [facility] urgent care but not hospitalized. Better today. Under close MH care.   ROS  All other systems reviewed and negative   Past Medical History:  Diagnosis Date   Anemia    Presumably from menorrhagia/menomenorrhagia; has had both blood and iron transfusions   Anxiety    Back pain    Bipolar 1 disorder (HCC)    With depression   Chest pain    Depression    Diastolic heart failure (HCC)    Essential hypertension    Fibromyalgia    Headache    History of blood transfusion 08/2017   [facility]   History of hiatal hernia    IBS (irritable bowel syndrome)    Infertility, female    Infertility, female    Lactose intolerance    Lower extremity edema    Migraines    Morbid obesity (HCC)    Osteoarthritis    Hands, ankles; HLA-B27 positive   PCOS (polycystic ovarian syndrome)    Prediabetes    Seasonal allergies    With recurrent allergic rhinitis   Sleep apnea    SOB (shortness of breath)    Uterine leiomyoma    Vitamin D deficiency      Past Surgical History:  Procedure Laterality Date   CARDIAC CT ANGIOGRAM  03/2019    Ca Score 0. Normal Coronary origin R dominant.  No evidence of CAD. ? Liver nodules - poorly visualized (consider MRI w & w/o Gad contrast)   CERVICAL POLYPECTOMY  01/15/2018   Procedure: CERVICAL POLYPECTOMY;  Surgeon: Richardean Chimera, MD;  Location: [facility];  Service: Gynecology;;   COLONOSCOPY  2008   Normal   DIAGNOSTIC LAPAROSCOPY  08/1999   dermoid cyst, RSO   DILATION AND CURETTAGE OF UTERUS  05/2004   MAB   DILATION AND CURETTAGE OF UTERUS  04/2015   HYSTEROSCOPY WITH D & C  07/22/2011   Procedure: DILATATION AND CURETTAGE /HYSTEROSCOPY;  Surgeon: Jeani Hawking, MD;  Location: [facility];  Service: Gynecology;;   HYSTEROSCOPY WITH D & C N/A 01/15/2018   Procedure: DILATATION AND CURETTAGE Melton Krebs WITH MYOSURE;  Surgeon: Richardean Chimera, MD;  Location: [facility];  Service: Gynecology;  Laterality: N/A;   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  Suggestion of high cardiac output (possibly related to anemia thyrotoxicosis, Pregnancy, sepsis etc.) -- was in setting of symptomatic anemia - Hgb 5.7     Outpatient Encounter Medications as of 12/04/2022  Medication Sig   acetaminophen (TYLENOL) 325 MG tablet Take 650 mg by mouth every 6 (six) hours as needed for mild  pain or headache.   amLODipine (NORVASC) 5 MG tablet Take 1 tablet (5 mg total) by mouth daily.   cariprazine (VRAYLAR) 3 MG capsule Take 3 mg by mouth daily.   chlorthalidone (HYGROTON) 25 MG tablet TAKE 1/2 TABLET(12.5 MG) BY MOUTH DAILY   Cholecalciferol (VITAMIN D-3) 125 MCG (5000 UT) TABS Take once daily   clonazePAM (KLONOPIN) 0.5 MG tablet Take 1 tablet (0.5 mg total) by mouth daily as needed for anxiety.   fluticasone (FLONASE) 50 MCG/ACT nasal spray 1 spray in each nostril   gabapentin (NEURONTIN) 100 MG capsule Take 100 mg by mouth 3 (three) times daily.   lamoTRIgine (LAMICTAL) 150 MG tablet Take 1 tablet (150 mg total) by mouth 2 (two) times daily. For mood   lithium carbonate  (LITHOBID) 300 MG ER tablet Take 2 tablets (600 mg total) by mouth 2 (two) times daily.   metFORMIN (GLUCOPHAGE) 500 MG tablet Take 1 tablet (500 mg total) by mouth 2 (two) times daily with a meal.   metoprolol succinate (TOPROL-XL) 100 MG 24 hr tablet TAKE 1 TABLET BY MOUTH DAILY WITH OR IMMEDIATELY FOLLOWING A MEAL   Cyanocobalamin (VITAMIN B-12) 5000 MCG TBDP Take once daily (Patient not taking: Reported on 12/04/2022)   [DISCONTINUED] CAPLYTA 42 MG capsule Take 42 mg by mouth daily.   No facility-administered encounter medications on file as of 12/04/2022.     Today's Vitals   12/04/22 0940 12/04/22 1010  BP: 138/73   Pulse: 88   Resp: 18   Temp: 99.2 F (37.3 C)   SpO2: 96%   Weight: (!) 323 lb 11.2 oz (146.8 kg)   PainSc:  0-No pain   Body mass index is 55.56 kg/m.   PHYSICAL EXAM GENERAL:alert, no distress and comfortable SKIN: no rash. Superficial cuts to left forearm EYES: sclera clear LUNGS:  normal breathing effort HEART:  no lower extremity edema NEURO: alert & oriented x 3 with fluent speech, no focal motor/sensory deficits    CBC    Component Value Date/Time   WBC 8.9 12/04/2022 0924   WBC 10.3 09/15/2022 0706   RBC 4.00 12/04/2022 0924   HGB 11.1 (L) 12/04/2022 0924   HGB 9.2 (L) 09/28/2020 1213   HCT 35.7 (L) 12/04/2022 0924   HCT 30.9 (L) 09/28/2020 1213   PLT 369 12/04/2022 0924   PLT 350 09/28/2020 1213   MCV 89.3 12/04/2022 0924   MCV 76 (L) 09/28/2020 1213   MCH 27.8 12/04/2022 0924   MCHC 31.1 12/04/2022 0924   RDW 16.5 (H) 12/04/2022 0924   RDW 17.0 (H) 09/28/2020 1213   LYMPHSABS 1.3 12/04/2022 0924   MONOABS 0.6 12/04/2022 0924   EOSABS 0.1 12/04/2022 0924   BASOSABS 0.0 12/04/2022 0924        ASSESSMENT & PLAN:Christine [name] is a pleasant 47 yo AAF with history of uterine fibroids, polyps, PCOS, obesity, HTN, and arthritis found to have severe iron deficiency anemia secondary to chronic blood loss    1. Iron deficiency anemia  secondary to chronic blood loss from menorrhagia -She required frequent IV Feraheme support in the past, from January- June 2019, then she lost to f/u -when she re-established in 02/2019, she presented with symptomatic anemia with Hg 7.8, labs revealed severe iron deficiency with ferritin <4, serum iron 18, 4% transferrin sat  -She received monthly IV Feraheme from 02/2019 - 04/2019, she responded well and anemia resolved until she became symptomatic again with exertional dyspnea, fatigue, lightheadedness -Most recently received IV iron  several times from 06/2022 - 09/2022, clinically, her fatigue has improved -She continues to have heavy menses, under new OB/GYN care undergoing fibroid workup, and recently started progestin to manage bleeding.  This is helping some -Scheduled for colonoscopy 5/30 to rule out GI bleeding source -Ms. Skaggs appears stable.  Hgb has improved 11.3, iron saturation remains low, ferritin is pending.   -Will proceed with IV Venofer today as scheduled, and add on additional infusions pending today's ferritin level -Return for lab every 2 months x 3, with IV iron as needed -Follow-up in 6 months   2.  Health and wellness -Previously under care of weight loss wellness center, PCP, cardiology, and Gyn.  She now has new GYN and has reestablished with healthy weight management -Again reviewed age-appropriate cancer screenings this year.  Has never had a mammogram.   -Colonoscopy scheduled 5/30 with Dr. Chales Abrahams -She agrees to discuss with PCP   3. Bipolar -On aripiprazole, clonazepam, lamotrigine, lithium -She is under mental health care and on disability for bipolar but does work some  -Father-in-law passed away 6 months ago -Also has stress from 2 teenage daughters  -Her state worsened recently, she engaged in self-harm and is under the close care of mental health provider, she did not require hospitalization  -Feeling better today, emotional support and active listening  given   PLAN: -CBC reviewed, Hgb 11.3, she responded well to IV iron given 06/2022 - 09/2022  -IV iron today as scheduled, additional IV iron pending iron/ferritin from today -Lab q2 months x3 -F/up in 6 months or sooner if needed -Active listening/emotional support given     All questions were answered. The patient knows to call the clinic with any problems, questions or concerns. No barriers to learning were detected. I spent 20 minutes counseling the patient face to face. The total time spent in the appointment was 30 minutes and more than 50% was on counseling, review of test results, and coordination of care.   Santiago Glad, NP-C 12/04/2022

## 2022-12-06 DIAGNOSIS — F319 Bipolar disorder, unspecified: Secondary | ICD-10-CM | POA: Diagnosis not present

## 2022-12-09 ENCOUNTER — Telehealth: Payer: Self-pay | Admitting: Nurse Practitioner

## 2022-12-09 ENCOUNTER — Encounter: Payer: Self-pay | Admitting: Hematology

## 2022-12-09 NOTE — Telephone Encounter (Signed)
Contacted patient to scheduled appointments. Left message with appointment details and a call back number if patient had any questions or could not accommodate the time we provided.   

## 2022-12-10 ENCOUNTER — Telehealth: Payer: Self-pay | Admitting: *Deleted

## 2022-12-10 DIAGNOSIS — E282 Polycystic ovarian syndrome: Secondary | ICD-10-CM | POA: Diagnosis not present

## 2022-12-10 DIAGNOSIS — Z0000 Encounter for general adult medical examination without abnormal findings: Secondary | ICD-10-CM | POA: Diagnosis not present

## 2022-12-10 DIAGNOSIS — D509 Iron deficiency anemia, unspecified: Secondary | ICD-10-CM | POA: Diagnosis not present

## 2022-12-10 DIAGNOSIS — E559 Vitamin D deficiency, unspecified: Secondary | ICD-10-CM | POA: Diagnosis not present

## 2022-12-10 DIAGNOSIS — F419 Anxiety disorder, unspecified: Secondary | ICD-10-CM | POA: Diagnosis not present

## 2022-12-10 DIAGNOSIS — I10 Essential (primary) hypertension: Secondary | ICD-10-CM | POA: Diagnosis not present

## 2022-12-10 DIAGNOSIS — R7303 Prediabetes: Secondary | ICD-10-CM | POA: Diagnosis not present

## 2022-12-10 DIAGNOSIS — I5032 Chronic diastolic (congestive) heart failure: Secondary | ICD-10-CM | POA: Diagnosis not present

## 2022-12-10 DIAGNOSIS — Z23 Encounter for immunization: Secondary | ICD-10-CM | POA: Diagnosis not present

## 2022-12-10 DIAGNOSIS — F319 Bipolar disorder, unspecified: Secondary | ICD-10-CM | POA: Diagnosis not present

## 2022-12-10 DIAGNOSIS — E785 Hyperlipidemia, unspecified: Secondary | ICD-10-CM | POA: Diagnosis not present

## 2022-12-10 NOTE — Telephone Encounter (Signed)
Yesi,  This pt's BMI is greater than 50; their procedure will need to be performed at the hospital.  Thanks,  Aadvika Konen 

## 2022-12-12 DIAGNOSIS — R5382 Chronic fatigue, unspecified: Secondary | ICD-10-CM | POA: Diagnosis not present

## 2022-12-12 DIAGNOSIS — M2550 Pain in unspecified joint: Secondary | ICD-10-CM | POA: Diagnosis not present

## 2022-12-12 DIAGNOSIS — F3132 Bipolar disorder, current episode depressed, moderate: Secondary | ICD-10-CM | POA: Diagnosis not present

## 2022-12-12 DIAGNOSIS — F411 Generalized anxiety disorder: Secondary | ICD-10-CM | POA: Diagnosis not present

## 2022-12-12 DIAGNOSIS — Z1589 Genetic susceptibility to other disease: Secondary | ICD-10-CM | POA: Diagnosis not present

## 2022-12-12 DIAGNOSIS — M79671 Pain in right foot: Secondary | ICD-10-CM | POA: Diagnosis not present

## 2022-12-12 DIAGNOSIS — M79672 Pain in left foot: Secondary | ICD-10-CM | POA: Diagnosis not present

## 2022-12-13 ENCOUNTER — Telehealth: Payer: Self-pay | Admitting: Gastroenterology

## 2022-12-13 DIAGNOSIS — F319 Bipolar disorder, unspecified: Secondary | ICD-10-CM | POA: Diagnosis not present

## 2022-12-13 NOTE — Telephone Encounter (Signed)
Left message for patient to call back  

## 2022-12-13 NOTE — Telephone Encounter (Signed)
Confirmed with patient that she would like to cancel procedure and reschedule for a later date d/t a conflict with her scheduling. Advised her that scheduling for hospital procedures is currently a few months out & there may be a wait, however we will contact her with next available date soon. She verbalized all understanding, no further questions.

## 2022-12-13 NOTE — Telephone Encounter (Signed)
Inbound call from patient, is wishing to reschedule procedure at [facility] on 5/30.

## 2022-12-13 NOTE — Telephone Encounter (Signed)
PT returning call. Please advise. 

## 2022-12-14 ENCOUNTER — Inpatient Hospital Stay: Payer: Medicare Other

## 2022-12-16 ENCOUNTER — Encounter: Payer: Self-pay | Admitting: Hematology

## 2022-12-18 ENCOUNTER — Inpatient Hospital Stay: Payer: Medicare Other

## 2022-12-18 ENCOUNTER — Other Ambulatory Visit: Payer: Self-pay

## 2022-12-18 VITALS — BP 137/62 | HR 75 | Temp 99.0°F | Resp 16

## 2022-12-18 DIAGNOSIS — F319 Bipolar disorder, unspecified: Secondary | ICD-10-CM | POA: Diagnosis not present

## 2022-12-18 DIAGNOSIS — Z79899 Other long term (current) drug therapy: Secondary | ICD-10-CM | POA: Diagnosis not present

## 2022-12-18 DIAGNOSIS — D500 Iron deficiency anemia secondary to blood loss (chronic): Secondary | ICD-10-CM | POA: Diagnosis not present

## 2022-12-18 DIAGNOSIS — N920 Excessive and frequent menstruation with regular cycle: Secondary | ICD-10-CM | POA: Diagnosis not present

## 2022-12-18 MED ORDER — ALTEPLASE 2 MG IJ SOLR: 2.0000 mg | Freq: Once | INTRAMUSCULAR | Status: DC | PRN

## 2022-12-18 MED ORDER — SODIUM CHLORIDE 0.9 % IV SOLN
300.0000 mg | Freq: Once | INTRAVENOUS | Status: AC
  Administered 2022-12-18: 300 mg via INTRAVENOUS
  Filled 2022-12-18: qty 300

## 2022-12-18 MED ORDER — SODIUM CHLORIDE 0.9% FLUSH: 3.0000 mL | Freq: Once | INTRAVENOUS | Status: DC | PRN

## 2022-12-18 MED ORDER — HEPARIN SOD (PORK) LOCK FLUSH 100 UNIT/ML IV SOLN: 500.0000 [IU] | Freq: Once | INTRAVENOUS | Status: DC | PRN

## 2022-12-18 MED ORDER — SODIUM CHLORIDE 0.9% FLUSH: 10.0000 mL | INTRAVENOUS | Status: DC | PRN

## 2022-12-18 MED ORDER — SODIUM CHLORIDE 0.9 % IV SOLN: Freq: Once | INTRAVENOUS | Status: AC

## 2022-12-18 MED ORDER — HEPARIN SOD (PORK) LOCK FLUSH 100 UNIT/ML IV SOLN: 250.0000 [IU] | Freq: Once | INTRAVENOUS | Status: DC | PRN

## 2022-12-18 NOTE — Patient Instructions (Signed)

## 2022-12-18 NOTE — Progress Notes (Signed)
Pt declined to stay for 30 minutes post infusion observation period. VSS at time of discharge. Ambulated independently to lobby for discharge.

## 2022-12-19 NOTE — Telephone Encounter (Signed)
Looks like this is a patient of Dr. Urban Gibson.  I see he is away.  We can offer her an appointment for EGD and colonoscopy to evaluate iron deficiency anemia and family history of colon cancer with any of the other providers that have openings on a different date, I am certainly happy to see her if that works or another available physician at [facility] or [place] outpatient procedure days.   a  some year office would notify other physician about this scheduling.  Patient has been seen by a PA in the office already.

## 2022-12-20 DIAGNOSIS — F319 Bipolar disorder, unspecified: Secondary | ICD-10-CM | POA: Diagnosis not present

## 2022-12-24 NOTE — Telephone Encounter (Signed)
Left Message for pt to call back: ? 03/13/2023  Appointment at [facility] with Dr. Chales Abrahams

## 2022-12-25 DIAGNOSIS — N939 Abnormal uterine and vaginal bleeding, unspecified: Secondary | ICD-10-CM | POA: Diagnosis not present

## 2022-12-25 DIAGNOSIS — D259 Leiomyoma of uterus, unspecified: Secondary | ICD-10-CM | POA: Diagnosis not present

## 2022-12-25 NOTE — Telephone Encounter (Signed)
Pt was made aware of Dr. Leone Payor recommendations: Pt stated that she is fine to wait for Dr. Chales Abrahams.  Pt was rescheduled to see Dr. Chales Abrahams for her EGD/Colonoscopy on 03/13/2023 at [facility]: Start Time 10:15 AM. Case ID number is [id number]: Pt made aware Pt was scheduled for a previsit on 02/03/2023 at 4:00 PM. Pt made aware. Pt is currently scheduled to see a Cardiologist on 01/03/2023. Personal reminder placed in Epic to obtain cardiac clearance after seen by Cardiologist.  Pt verbalized understanding with all questions answered.

## 2022-12-25 NOTE — Telephone Encounter (Signed)
Patient is retuning your call 

## 2022-12-26 ENCOUNTER — Encounter: Payer: Self-pay | Admitting: Hematology

## 2022-12-26 ENCOUNTER — Ambulatory Visit: Payer: Medicaid Other | Admitting: Podiatry

## 2022-12-27 DIAGNOSIS — F319 Bipolar disorder, unspecified: Secondary | ICD-10-CM | POA: Diagnosis not present

## 2023-01-02 ENCOUNTER — Ambulatory Visit (HOSPITAL_COMMUNITY): Admit: 2023-01-02 | Payer: Medicare Other | Admitting: Gastroenterology

## 2023-01-02 ENCOUNTER — Encounter (HOSPITAL_COMMUNITY): Payer: Self-pay

## 2023-01-02 DIAGNOSIS — F411 Generalized anxiety disorder: Secondary | ICD-10-CM | POA: Diagnosis not present

## 2023-01-02 DIAGNOSIS — G4700 Insomnia, unspecified: Secondary | ICD-10-CM | POA: Diagnosis not present

## 2023-01-02 DIAGNOSIS — F3132 Bipolar disorder, current episode depressed, moderate: Secondary | ICD-10-CM | POA: Diagnosis not present

## 2023-01-02 SURGERY — COLONOSCOPY WITH PROPOFOL
Anesthesia: Monitor Anesthesia Care

## 2023-01-02 NOTE — Progress Notes (Signed)
Cardiology Clinic Note   Patient Name: Christine [name] Date of Encounter: 01/03/2023  Primary Care Provider:  Carilyn Goodpasture, NP Primary Cardiologist:  Christine Lemma, MD  Patient Profile    47 year old female with history of hypertension, pulmonary hypertension, OSA on CPAP, type 2 diabetes, bipolar disorder, and anemia for which she gets iron infusions.  She was last seen in the office on 01/05/2021 and was without any complaints.  No medication changes were made or cardiac testing planned.  Past Medical History    Past Medical History:  Diagnosis Date   Anemia    Presumably from menorrhagia/menomenorrhagia; has had both blood and iron transfusions   Anxiety    Back pain    Bipolar 1 disorder (HCC)    With depression   Chest pain    Depression    Diastolic heart failure (HCC)    Essential hypertension    Fibromyalgia    Headache    History of blood transfusion 08/2017   [facility]   History of hiatal hernia    IBS (irritable bowel syndrome)    Infertility, female    Infertility, female    Lactose intolerance    Lower extremity edema    Migraines    Morbid obesity (HCC)    Osteoarthritis    Hands, ankles; HLA-B27 positive   PCOS (polycystic ovarian syndrome)    Prediabetes    Seasonal allergies    With recurrent allergic rhinitis   Sleep apnea    SOB (shortness of breath)    Uterine leiomyoma    Vitamin D deficiency    Past Surgical History:  Procedure Laterality Date   CARDIAC CT ANGIOGRAM  03/2019    Ca Score 0. Normal Coronary origin R dominant. No evidence of CAD. ? Liver nodules - poorly visualized (consider MRI w & w/o Gad contrast)   CERVICAL POLYPECTOMY  01/15/2018   Procedure: CERVICAL POLYPECTOMY;  Surgeon: Christine Chimera, MD;  Location: [facility];  Service: Gynecology;;   COLONOSCOPY  2008   Normal   DIAGNOSTIC LAPAROSCOPY  08/1999   dermoid cyst, RSO   DILATION AND CURETTAGE OF UTERUS  05/2004   MAB   DILATION AND CURETTAGE OF UTERUS  04/2015   HYSTEROSCOPY  WITH D & C  07/22/2011   Procedure: DILATATION AND CURETTAGE /HYSTEROSCOPY;  Surgeon: Christine Hawking, MD;  Location: [facility];  Service: Gynecology;;   HYSTEROSCOPY WITH D & C N/A 01/15/2018   Procedure: DILATATION AND CURETTAGE Christine [name] WITH MYOSURE;  Surgeon: Christine Chimera, MD;  Location: [facility];  Service: Gynecology;  Laterality: N/A;   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  Suggestion of high cardiac output (possibly related to anemia thyrotoxicosis, Pregnancy, sepsis etc.) -- was in setting of symptomatic anemia - Hgb 5.7    Allergies  Allergies  Allergen Reactions   Penicillins Rash and Other (See Comments)    Has patient had a PCN reaction causing immediate rash, facial/tongue/throat swelling, SOB or lightheadedness with hypotension: Yes Has patient had a PCN reaction causing severe rash involving mucus membranes or skin necrosis: Unknown Has patient had a PCN reaction that required hospitalization: No Has patient had a PCN reaction occurring within the last 10 years: No If all of the above answers are "NO", then may proceed with Cephalosporin use.     History of Present Illness    Mrs. Christine [name] presents today for cardiology preoperative evaluation  to have hernia repair by Dr. Abigail [name], [facility] on date to be determined.  She is also to have a colonoscopy via [facility].  She comes today without any cardiac complaints.  She walks on a treadmill about 10 to 15 minutes twice a week without chest pain, or significant shortness of breath, stopping only due to fatigue.  She is medically compliant, is trying to lose weight and has lost approximately 20 pounds.  She denies any exertional symptoms.  She continues to struggle with bipolar symptoms but is feeling more in control lately.   Home Medications    Current Outpatient Medications  Medication Sig  Dispense Refill   acetaminophen (TYLENOL) 325 MG tablet Take 650 mg by mouth every 6 (six) hours as needed for mild pain or headache.     amLODipine (NORVASC) 5 MG tablet Take 1 tablet (5 mg total) by mouth daily. 30 tablet 0   cariprazine (VRAYLAR) 3 MG capsule Take 3 mg by mouth daily.     chlorthalidone (HYGROTON) 25 MG tablet TAKE 1/2 TABLET(12.5 MG) BY MOUTH DAILY 45 tablet 0   Cholecalciferol (VITAMIN D-3) 125 MCG (5000 UT) TABS Take once daily 30 tablet 0   clonazePAM (KLONOPIN) 0.5 MG tablet Take 1 tablet (0.5 mg total) by mouth daily as needed for anxiety. 30 tablet 0   Cyanocobalamin (VITAMIN B-12) 5000 MCG TBDP Take once daily     FLUoxetine (PROZAC) 20 MG capsule Take 20 mg by mouth daily.     fluticasone (FLONASE) 50 MCG/ACT nasal spray 1 spray in each nostril     gabapentin (NEURONTIN) 100 MG capsule Take 100 mg by mouth 3 (three) times daily.     lamoTRIgine (LAMICTAL) 150 MG tablet Take 1 tablet (150 mg total) by mouth 2 (two) times daily. For mood 60 tablet 2   lithium carbonate (LITHOBID) 300 MG ER tablet Take 2 tablets (600 mg total) by mouth 2 (two) times daily. 120 tablet 0   metFORMIN (GLUCOPHAGE) 500 MG tablet Take 1 tablet (500 mg total) by mouth 2 (two) times daily with a meal. 60 tablet 0   metoprolol succinate (TOPROL-XL) 100 MG 24 hr tablet TAKE 1 TABLET BY MOUTH DAILY WITH OR IMMEDIATELY FOLLOWING A MEAL 90 tablet 1   No current facility-administered medications for this visit.     Family History    Family History  Problem Relation Age of Onset   Pancreatic cancer Mother    Anxiety disorder Mother    Depression Mother    High blood pressure Mother    Hypertension Mother    Hyperlipidemia Mother    Colon cancer Father    High blood pressure Father    Diabetes Father    Hypertension Father    Hyperlipidemia Father    Heart disease Father    Stroke Father    Kidney disease Father    Sleep apnea Father    Obesity Father    Cancer Brother 6        leukemia, childhood   High blood pressure Brother    Migraines Neg Hx    She indicated that her mother is deceased. She indicated that her father is deceased. She indicated that her brother is alive. She indicated that the status of her neg hx is unknown.  Social History    Social History   Socioeconomic History   Marital status: Married    Spouse name: Burnett Womac   Number of children: 2   Years of  education: 2 years of grad school + bach & HS   Highest education level: Bachelor's degree (e.g., BA, AB, BS)  Occupational History   Occupation: disabled   Occupation: Part time Arts development officer  Tobacco Use   Smoking status: Never   Smokeless tobacco: Never  Vaping Use   Vaping Use: Never used  Substance and Sexual Activity   Alcohol use: Yes    Comment: occasional, maybe once a month or so   Drug use: No   Sexual activity: Yes    Partners: Male    Birth control/protection: None  Other Topics Concern   Not on file  Social History Narrative   ** Merged History Encounter **       Lives at home with her husband and 2 adopted daughters. Right handed Caffeine: decreased intake, drinks rarely  Is on long-term disability.   Social Determinants of Health   Financial Resource Strain: Medium Risk (09/10/2017)   Overall Financial Resource Strain (CARDIA)    Difficulty of Paying Living Expenses: Somewhat hard  Food Insecurity: Food Insecurity Present (09/10/2017)   Hunger Vital Sign    Worried About Running Out of Food in the Last Year: Sometimes true    Ran Out of Food in the Last Year: Sometimes true  Transportation Needs: No Transportation Needs (09/10/2017)   PRAPARE - Administrator, Civil Service (Medical): No    Lack of Transportation (Non-Medical): No  Physical Activity: Unknown (09/10/2017)   Exercise Vital Sign    Days of Exercise per Week: 0 days    Minutes of Exercise per Session: Not on file  Stress: Stress Concern Present (09/10/2017)   [employer] of Occupational Health - Occupational Stress Questionnaire    Feeling of Stress : Rather much  Social Connections: Unknown (09/10/2017)   Social Connection and Isolation Panel [NHANES]    Frequency of Communication with Friends and Family: Never    Frequency of Social Gatherings with Friends and Family: Never    Attends Religious Services: Never    Database administrator or Organizations: No    Attends Banker Meetings: Never    Marital Status: Not on file  Intimate Partner Violence: Not At Risk (12/27/2020)   Humiliation, Afraid, Rape, and Kick questionnaire    Fear of Current or Ex-Partner: No    Emotionally Abused: No    Physically Abused: No    Sexually Abused: No     Review of Systems    General:  No chills, fever, night sweats or weight changes.  Cardiovascular:  No chest pain, dyspnea on exertion, edema, orthopnea, palpitations, paroxysmal nocturnal dyspnea. Dermatological: No rash, lesions/masses Respiratory: No cough, dyspnea Urologic: No hematuria, dysuria Abdominal:   No nausea, vomiting, diarrhea, bright red blood per rectum, melena, or hematemesis Neurologic:  No visual changes, wkns, changes in mental status. All other systems reviewed and are otherwise negative except as noted above.     Physical Exam    VS:  BP (!) 150/70 (BP Location: Left Arm, Patient Position: Sitting, Cuff Size: Normal)   Pulse 84   Ht 5\' 4"  (1.626 m)   Wt (!) 328 lb (148.8 kg)   SpO2 95%   BMI 56.30 kg/m  , BMI Body mass index is 56.3 kg/m.     GEN: Well nourished, well developed, in no acute distress.  Morbidly obese HEENT: normal. Neck: Supple, no JVD, carotid bruits, or masses. Cardiac: RRR, 2/6 systolic murmur, heard best at the right sternal border,  no rubs, or gallops. No clubbing, cyanosis, edema.  Radials/DP/PT 2+ and equal bilaterally.  Respiratory:  Respirations regular and unlabored, clear to auscultation bilaterally. GI: Soft, nontender, nondistended,  BS + x 4. MS: no deformity or atrophy. Skin: warm and dry, no rash. Neuro:  Strength and sensation are intact. Psych: Normal affect.  Accessory Clinical Findings    ECG personally reviewed by me today-normal sinus rhythm, heart rate of 84 bpm, inferior posterior nonspecific T wave abnormalities.- No acute changes  Lab Results  Component Value Date   WBC 8.9 12/04/2022   HGB 11.1 (L) 12/04/2022   HCT 35.7 (L) 12/04/2022   MCV 89.3 12/04/2022   PLT 369 12/04/2022   Lab Results  Component Value Date   CREATININE 0.83 09/15/2022   BUN 12 09/15/2022   NA 141 09/15/2022   K 3.3 (L) 09/15/2022   CL 106 09/15/2022   CO2 25 09/15/2022   Lab Results  Component Value Date   ALT 15 05/02/2022   AST 13 (L) 05/02/2022   ALKPHOS 51 05/02/2022   BILITOT 0.7 05/02/2022   Lab Results  Component Value Date   CHOL 178 05/09/2022   HDL 38 (L) 05/09/2022   LDLCALC 125 (H) 05/09/2022   TRIG 81 05/09/2022    Lab Results  Component Value Date   HGBA1C 5.7 05/02/2022    Review of Prior Studies: Echocardiogram 08/09/2019 1. Left ventricular ejection fraction, by visual estimation, is 60 to  65%. The left ventricle has normal function. There is no left ventricular  hypertrophy.   2. The left ventricle has no regional wall motion abnormalities.   3. Global right ventricle has normal systolic function.The right  ventricular size is normal. No increase in right ventricular wall  thickness.   4. Left atrial size was normal.   5. Right atrial size was normal.   6. The mitral valve is normal in structure. No evidence of mitral valve  regurgitation.   7. The tricuspid valve is normal in structure.   8. The aortic valve is normal in structure. Aortic valve regurgitation is  not visualized.   9. The pulmonic valve was normal in structure. Pulmonic valve  regurgitation is not visualized.  10. The atrial septum is grossly normal.   Assessment & Plan   1.  Hypertension: I did recheck her  blood pressure was found to be 124/72.  No changes on her medication regimen.  Low-sodium diet, weight loss and increased exercise are recommended.  2.  Preoperative cardiac evaluation: Therefore, based on ACC/AHA guidelines, patient would be at acceptable risk for the planned procedure without further cardiovascular testing.  She may proceed with ventral hernia repair and colonoscopy.  No medications are recommended to be held perioperatively.          Signed, Bettey Mare. Liborio Nixon, ANP, AACC   01/03/2023 12:31 PM      Office [phone] Fax [phone]  Notice: This dictation was prepared with Dragon dictation along with smaller phrase technology. Any transcriptional errors that result from this process are unintentional and may not be corrected upon review.

## 2023-01-03 ENCOUNTER — Encounter: Payer: Self-pay | Admitting: Adult Health

## 2023-01-03 ENCOUNTER — Ambulatory Visit: Payer: Medicare Other | Attending: Adult Health | Admitting: Adult Health

## 2023-01-03 VITALS — BP 124/78 | HR 84 | Ht 64.0 in | Wt 328.0 lb

## 2023-01-03 DIAGNOSIS — Z01818 Encounter for other preprocedural examination: Secondary | ICD-10-CM | POA: Diagnosis not present

## 2023-01-03 DIAGNOSIS — I10 Essential (primary) hypertension: Secondary | ICD-10-CM | POA: Diagnosis not present

## 2023-01-03 NOTE — Patient Instructions (Addendum)
Medication Instructions:  No medication changes *If you need a refill on your cardiac medications before your next appointment, please call your pharmacy*   If you have labs (blood work) drawn today and your tests are completely normal, you will receive your results only by: MyChart Message (if you have MyChart) OR A paper copy in the mail If you have any lab test that is abnormal or we need to change your treatment, we will call you to review the results.  Follow-Up: At [facility], you and your health needs are our priority.  As part of our continuing mission to provide you with exceptional heart care, we have created designated Provider Care Teams.  These Care Teams include your primary Cardiologist (physician) and Advanced Practice Providers (APPs -  Physician Assistants and Nurse Practitioners) who all work together to provide you with the care you need, when you need it.   Your next appointment:   12 month(s)  Provider:   Joni Reining, DNP, ANP        We will fax your clearance to [facility].

## 2023-01-06 DIAGNOSIS — H6993 Unspecified Eustachian tube disorder, bilateral: Secondary | ICD-10-CM | POA: Diagnosis not present

## 2023-01-06 NOTE — Telephone Encounter (Signed)
Patient was seen by cardiology on 01/03/23. Per OV notes with Dr. Joni Reining, patient received cardiac clearance for upcoming procedure.

## 2023-01-09 ENCOUNTER — Ambulatory Visit (INDEPENDENT_AMBULATORY_CARE_PROVIDER_SITE_OTHER): Payer: Medicare Other

## 2023-01-09 ENCOUNTER — Ambulatory Visit: Payer: Medicare Other | Admitting: Podiatry

## 2023-01-09 DIAGNOSIS — M7661 Achilles tendinitis, right leg: Secondary | ICD-10-CM

## 2023-01-09 DIAGNOSIS — M62461 Contracture of muscle, right lower leg: Secondary | ICD-10-CM | POA: Diagnosis not present

## 2023-01-09 DIAGNOSIS — M7662 Achilles tendinitis, left leg: Secondary | ICD-10-CM | POA: Diagnosis not present

## 2023-01-09 DIAGNOSIS — M79671 Pain in right foot: Secondary | ICD-10-CM | POA: Diagnosis not present

## 2023-01-09 DIAGNOSIS — M79672 Pain in left foot: Secondary | ICD-10-CM

## 2023-01-09 NOTE — Patient Instructions (Signed)
Achilles Tendinitis  with Rehab Achilles tendinitis is a disorder of the Achilles tendon. The Achilles tendon connects the large calf muscles (Gastrocnemius and Soleus) to the heel bone (calcaneus). This tendon is sometimes called the heel cord. It is important for pushing-off and standing on your toes and is important for walking, running, or jumping. Tendinitis is often caused by overuse and repetitive microtrauma. SYMPTOMS  Pain, tenderness, swelling, warmth, and redness may occur over the Achilles tendon even at rest.  Pain with pushing off, or flexing or extending the ankle.  Pain that is worsened after or during activity. CAUSES   Overuse sometimes seen with rapid increase in exercise programs or in sports requiring running and jumping.  Poor physical conditioning (strength and flexibility or endurance).  Running sports, especially training running down hills.  Inadequate warm-up before practice or play or failure to stretch before participation.  Injury to the tendon. PREVENTION   Warm up and stretch before practice or competition.  Allow time for adequate rest and recovery between practices and competition.  Keep up conditioning.  Keep up ankle and leg flexibility.  Improve or keep muscle strength and endurance.  Improve cardiovascular fitness.  Use proper technique.  Use proper equipment (shoes, skates).  To help prevent recurrence, taping, protective strapping, or an adhesive bandage may be recommended for several weeks after healing is complete. PROGNOSIS   Recovery may take weeks to several months to heal.  Longer recovery is expected if symptoms have been prolonged.  Recovery is usually quicker if the inflammation is due to a direct blow as compared with overuse or sudden strain. RELATED COMPLICATIONS   Healing time will be prolonged if the condition is not correctly treated. The injury must be given plenty of time to heal.  Symptoms can reoccur if  activity is resumed too soon.  Untreated, tendinitis may increase the risk of tendon rupture requiring additional time for recovery and possibly surgery. TREATMENT   The first treatment consists of rest anti-inflammatory medication, and ice to relieve the pain.  Stretching and strengthening exercises after resolution of pain will likely help reduce the risk of recurrence. Referral to a physical therapist or athletic trainer for further evaluation and treatment may be helpful.  A walking boot or cast may be recommended to rest the Achilles tendon. This can help break the cycle of inflammation and microtrauma.  Arch supports (orthotics) may be prescribed or recommended by your caregiver as an adjunct to therapy and rest.  Surgery to remove the inflamed tendon lining or degenerated tendon tissue is rarely necessary and has shown less than predictable results. MEDICATION   Nonsteroidal anti-inflammatory medications, such as aspirin and ibuprofen, may be used for pain and inflammation relief. Do not take within 7 days before surgery. Take these as directed by your caregiver. Contact your caregiver immediately if any bleeding, stomach upset, or signs of allergic reaction occur. Other minor pain relievers, such as acetaminophen, may also be used.  Pain relievers may be prescribed as necessary by your caregiver. Do not take prescription pain medication for longer than 4 to 7 days. Use only as directed and only as much as you need. HEAT AND COLD  Cold is used to relieve pain and reduce inflammation for acute and chronic Achilles tendinitis. Cold should be applied for 10 to 15 minutes every 2 to 3 hours for inflammation and pain and immediately after any activity that aggravates your symptoms. Use ice packs or an ice massage.  Heat may be used   before performing stretching and strengthening activities prescribed by your caregiver. Use a heat pack or a warm soak. SEEK MEDICAL CARE IF:  Symptoms get  worse or do not improve in 2 weeks despite treatment.  New, unexplained symptoms develop. Drugs used in treatment may produce side effects.   EXERCISES-- hold each stretch for 30 seconds and repeat 10 times.  Complete each stretch 3 times per day.   RANGE OF MOTION (ROM) AND STRETCHING EXERCISES - Achilles Tendinitis  These exercises may help you when beginning to rehabilitate your injury. Your symptoms may resolve with or without further involvement from your physician, physical therapist or athletic trainer. While completing these exercises, remember:   Restoring tissue flexibility helps normal motion to return to the joints. This allows healthier, less painful movement and activity.  An effective stretch should be held for at least 30 seconds.  A stretch should never be painful. You should only feel a gentle lengthening or release in the stretched tissue. STRETCH  Gastroc, Standing   Place hands on wall.  Extend right / left leg, keeping the front knee somewhat bent.  Slightly point your toes inward on your back foot.  Keeping your right / left heel on the floor and your knee straight, shift your weight toward the wall, not allowing your back to arch.  You should feel a gentle stretch in the right / left calf. Hold this position for __________ seconds. Repeat __________ times. Complete this stretch __________ times per day. STRETCH  Soleus, Standing   Place hands on wall.  Extend right / left leg, keeping the other knee somewhat bent.  Slightly point your toes inward on your back foot.  Keep your right / left heel on the floor, bend your back knee, and slightly shift your weight over the back leg so that you feel a gentle stretch deep in your back calf.  Hold this position for __________ seconds. Repeat __________ times. Complete this stretch __________ times per day. STRETCH  Gastrocsoleus, Standing  Note: This exercise can place a lot of stress on your foot and ankle.  Please complete this exercise only if specifically instructed by your caregiver.   Place the ball of your right / left foot on a step, keeping your other foot firmly on the same step.  Hold on to the wall or a rail for balance.  Slowly lift your other foot, allowing your body weight to press your heel down over the edge of the step.  You should feel a stretch in your right / left calf.  Hold this position for __________ seconds.  Repeat this exercise with a slight bend in your knee. Repeat __________ times. Complete this stretch __________ times per day.  STRENGTHENING EXERCISES - Achilles Tendinitis These exercises may help you when beginning to rehabilitate your injury. They may resolve your symptoms with or without further involvement from your physician, physical therapist or athletic trainer. While completing these exercises, remember:   Muscles can gain both the endurance and the strength needed for everyday activities through controlled exercises.  Complete these exercises as instructed by your physician, physical therapist or athletic trainer. Progress the resistance and repetitions only as guided.  You may experience muscle soreness or fatigue, but the pain or discomfort you are trying to eliminate should never worsen during these exercises. If this pain does worsen, stop and make certain you are following the directions exactly. If the pain is still present after adjustments, discontinue the exercise until you can discuss   the trouble with your clinician. STRENGTH - Plantar-flexors   Sit with your right / left leg extended. Holding onto both ends of a rubber exercise band/tubing, loop it around the ball of your foot. Keep a slight tension in the band.  Slowly push your toes away from you, pointing them downward.  Hold this position for __________ seconds. Return slowly, controlling the tension in the band/tubing. Repeat __________ times. Complete this exercise __________ times  per day.  STRENGTH - Plantar-flexors   Stand with your feet shoulder width apart. Steady yourself with a wall or table using as little support as needed.  Keeping your weight evenly spread over the width of your feet, rise up on your toes.*  Hold this position for __________ seconds. Repeat __________ times. Complete this exercise __________ times per day.  *If this is too easy, shift your weight toward your right / left leg until you feel challenged. Ultimately, you may be asked to do this exercise with your right / left foot only. STRENGTH  Plantar-flexors, Eccentric  Note: This exercise can place a lot of stress on your foot and ankle. Please complete this exercise only if specifically instructed by your caregiver.   Place the balls of your feet on a step. With your hands, use only enough support from a wall or rail to keep your balance.  Keep your knees straight and rise up on your toes.  Slowly shift your weight entirely to your right / left toes and pick up your opposite foot. Gently and with controlled movement, lower your weight through your right / left foot so that your heel drops below the level of the step. You will feel a slight stretch in the back of your calf at the end position.  Use the healthy leg to help rise up onto the balls of both feet, then lower weight only on the right / left leg again. Build up to 15 repetitions. Then progress to 3 consecutive sets of 15 repetitions.*  After completing the above exercise, complete the same exercise with a slight knee bend (about 30 degrees). Again, build up to 15 repetitions. Then progress to 3 consecutive sets of 15 repetitions.* Perform this exercise __________ times per day.  *When you easily complete 3 sets of 15, your physician, physical therapist or athletic trainer may advise you to add resistance by wearing a backpack filled with additional weight. STRENGTH - Plantar Flexors, Seated   Sit on a chair that allows your feet  to rest flat on the ground. If necessary, sit at the edge of the chair.  Keeping your toes firmly on the ground, lift your right / left heel as far as you can without increasing any discomfort in your ankle. Repeat __________ times. Complete this exercise __________ times a day. *If instructed by your physician, physical therapist or athletic trainer, you may add ____________________ of resistance by placing a weighted object on your right / left knee. Document Released: 02/20/2005 Document Revised: 10/14/2011 Document Reviewed: 11/03/2008 [employer] Patient Information 2014 [employer].    

## 2023-01-09 NOTE — Progress Notes (Signed)
Subjective:  Patient ID: Christine [name], female    DOB: Oct 21, 1975,  MRN: [id number]  Chief Complaint  Patient presents with   Foot Pain    RM 16 Bilateral heel and achilles tightness. Right over left. Tightness and pain is worse with first steps after rest.     47 y.o. female presents with concern for pain and tightness in the back of the heels bilaterally.  Says she has more discomfort in the right side than on the left.  Pain worsened with stretching of the Achilles and after she has been sitting or sleeping  Past Medical History:  Diagnosis Date   Anemia    Presumably from menorrhagia/menomenorrhagia; has had both blood and iron transfusions   Anxiety    Back pain    Bipolar 1 disorder (HCC)    With depression   Chest pain    Depression    Diastolic heart failure (HCC)    Essential hypertension    Fibromyalgia    Headache    History of blood transfusion 08/2017   [facility]   History of hiatal hernia    IBS (irritable bowel syndrome)    Infertility, female    Infertility, female    Lactose intolerance    Lower extremity edema    Migraines    Morbid obesity (HCC)    Osteoarthritis    Hands, ankles; HLA-B27 positive   PCOS (polycystic ovarian syndrome)    Prediabetes    Seasonal allergies    With recurrent allergic rhinitis   Sleep apnea    SOB (shortness of breath)    Uterine leiomyoma    Vitamin D deficiency     Allergies  Allergen Reactions   Penicillins Rash and Other (See Comments)    Has patient had a PCN reaction causing immediate rash, facial/tongue/throat swelling, SOB or lightheadedness with hypotension: Yes Has patient had a PCN reaction causing severe rash involving mucus membranes or skin necrosis: Unknown Has patient had a PCN reaction that required hospitalization: No Has patient had a PCN reaction occurring within the last 10 years: No If all of the above answers are "NO", then may proceed with Cephalosporin use.     ROS: Negative except as per  HPI above  Objective:  General: AAO x3, NAD  Dermatological: With inspection and palpation of the right and left lower extremities there are no open sores, no preulcerative lesions, no rash or signs of infection present. Nails are of normal length thickness and coloration.   Vascular:  Dorsalis Pedis artery and Posterior Tibial artery pedal pulses are 2/4 bilateral.  Capillary fill time < 3 sec to all digits.   Neruologic: Grossly intact via light touch bilateral. Protective threshold intact to all sites bilateral.   Musculoskeletal: Tender palpation along the mid substance of the Achilles just proximal to its insertion bilateral foot.  No significant osseous prominence noted.  Pain is increased with ankle dorsiflexion range of motion.  Dorsiflexion range of motion is limited bilaterally positive Silfverskiold test bilateral  Gait: Unassisted, Nonantalgic.   No images are attached to the encounter.  Radiographs:  Date: 01/09/2023 XR both feet Weightbearing AP/Lateral/Oblique   Findings: No significant osseous prominence of the posterior aspect of the heel where the Achilles inserts however there is mild Haglund's deformity noted bilaterally. Assessment:   1. Achilles tendinitis of both lower extremities   2. Gastrocnemius equinus of right lower extremity   3. Foot pain, bilateral      Plan:  Patient was evaluated and treated  and all questions answered.  # Achilles tendinitis bilateral -Discussed with the patient that she does have evidence of gastrocnemius equinus and Achilles tendinitis bilateral -Recommend stretching anti-inflammatory modalities as well as heel lifts and night splint -Dispensed night splint for the patient's right side at this visit -eRx for meloxicam 15 mg take once daily for the next 30 days -eRx for methylprednisolone 4 mg steroid taper pack take as directed for 6 days discussed the risk benefits and alternatives of this medication -Dispensed stretching  instructions with the patient.  Recommend heel lifts for shoes and dispensed this with the patient as well  Return in about 6 weeks (around 02/20/2023) for Follow-up bilateral Achilles tendinitis.          Corinna Gab, DPM [facility]

## 2023-01-10 ENCOUNTER — Telehealth: Payer: Self-pay | Admitting: Podiatry

## 2023-01-10 DIAGNOSIS — F319 Bipolar disorder, unspecified: Secondary | ICD-10-CM | POA: Diagnosis not present

## 2023-01-10 DIAGNOSIS — M7661 Achilles tendinitis, right leg: Secondary | ICD-10-CM

## 2023-01-10 MED ORDER — METHYLPREDNISOLONE 4 MG PO TBPK
ORAL_TABLET | ORAL | 0 refills | Status: DC
Start: 2023-01-10 — End: 2023-11-25

## 2023-01-10 MED ORDER — MELOXICAM 7.5 MG PO TABS
7.5000 mg | ORAL_TABLET | Freq: Every day | ORAL | 0 refills | Status: DC
Start: 2023-01-10 — End: 2023-02-14

## 2023-01-10 NOTE — Telephone Encounter (Signed)
Patient called, seen yesterday.  States pharmacy doesn't have her two medications that were prescribed.  Saw them in the note.  Will send to her pharmacy right now.

## 2023-01-13 ENCOUNTER — Telehealth: Payer: Self-pay

## 2023-01-13 ENCOUNTER — Other Ambulatory Visit: Payer: Self-pay | Admitting: Podiatry

## 2023-01-13 DIAGNOSIS — M62461 Contracture of muscle, right lower leg: Secondary | ICD-10-CM

## 2023-01-13 DIAGNOSIS — M79671 Pain in right foot: Secondary | ICD-10-CM

## 2023-01-13 DIAGNOSIS — M7661 Achilles tendinitis, right leg: Secondary | ICD-10-CM

## 2023-01-13 NOTE — Telephone Encounter (Signed)
[facility] Pre-operative Risk Assessment     Request for surgical clearance:     Endoscopy Procedure  What type of surgery is being performed?     EGD/Colonoscopy  When is this surgery scheduled?     03/13/2023  What type of clearance is required ?   Cardiac  Are there any medications that need to be held prior to surgery and how long? N/A  Practice name and name of physician performing surgery?      [facility]/ Dr. Chales Abrahams  What is your office phone and fax number?      Phone- [phone]  Fax- [phone]  Anesthesia type (None, local, MAC, general) ?       MAC

## 2023-01-13 NOTE — Telephone Encounter (Signed)
PT confirmed she is not on BT. Requesting callback.

## 2023-01-13 NOTE — Telephone Encounter (Signed)
Left message for pt to call back to ensure that pt is not on any Blood Thinner Meds.

## 2023-01-13 NOTE — Telephone Encounter (Signed)
Noted.   Request was sent for Cardiac Clearance for EGD/Colonoscopy

## 2023-01-13 NOTE — Telephone Encounter (Signed)
   Patient Name: Christine [name]  DOB: 10/11/1975 MRN: [id number]  Primary Cardiologist: Bryan Lemma, MD  Chart reviewed as part of pre-operative protocol coverage. Pre-op clearance already addressed by colleagues in earlier phone notes. To summarize recommendations:  -Preoperative cardiac evaluation: Therefore, based on ACC/AHA guidelines, patient would be at acceptable risk for the planned procedure without further cardiovascular testing.  She may proceed with ventral hernia repair and colonoscopy.  No medications are recommended to be held perioperatively.  Joni Reining, NP  Will route this bundled recommendation to requesting provider via Epic fax function and remove from pre-op pool. Please call with questions.  Sharlene Dory, PA-C 01/13/2023, 8:56 AM

## 2023-01-13 NOTE — Telephone Encounter (Signed)
-----   Message from Emeline Darling, RN sent at 12/25/2022  4:00 PM EDT ----- Personal reminder sent 12/25/2022  Pt was rescheduled to see Dr. Chales Abrahams for her EGD/Colonoscopy on 03/13/2023 at [facility]: Start Time 10:15 AM. Case ID number is [id number]: Pt made aware Pt was scheduled for a previsit on 02/03/2023 at 4:00 PM. Pt made aware. Pt is currently scheduled to see a Cardiologist on 01/03/2023. Personal reminder placed in Epic to obtain cardiac clearance after seen by Cardiologist.

## 2023-01-17 DIAGNOSIS — F319 Bipolar disorder, unspecified: Secondary | ICD-10-CM | POA: Diagnosis not present

## 2023-01-22 ENCOUNTER — Encounter: Payer: Self-pay | Admitting: Hematology

## 2023-01-24 DIAGNOSIS — F319 Bipolar disorder, unspecified: Secondary | ICD-10-CM | POA: Diagnosis not present

## 2023-01-28 ENCOUNTER — Other Ambulatory Visit: Payer: Self-pay

## 2023-01-28 ENCOUNTER — Encounter (HOSPITAL_COMMUNITY): Payer: Self-pay | Admitting: Surgery

## 2023-01-28 DIAGNOSIS — F3132 Bipolar disorder, current episode depressed, moderate: Secondary | ICD-10-CM | POA: Diagnosis not present

## 2023-01-28 DIAGNOSIS — F411 Generalized anxiety disorder: Secondary | ICD-10-CM | POA: Diagnosis not present

## 2023-01-28 NOTE — Progress Notes (Signed)
PCP - Carilyn Goodpasture, NP   Behavioral Health - Noralee Stain, Counselor Cardiologist - Dr Bryan Lemma (Cleared for surgery) Oncology - Santiago Glad, NP  Chest x-ray - 05/02/22 (2V) EKG - 01/03/23 Stress Test - n/a ECHO - 08/09/19 Cardiac Cath - n/a  ICD Pacemaker/Loop - n/a  Sleep Study -  Yes (09/2019) CPAP - use nightly  Diabetes Type - n/a  ERAS: Clear liquids til 1 PM DOS  Anesthesia review: Yes   STOP now taking any Aspirin (unless otherwise instructed by your surgeon), Aleve, Naproxen, Ibuprofen, Mobic. Motrin, Advil, Goody's, BC's, all herbal medications, fish oil, and all vitamins.   Coronavirus Screening Do you have any of the following symptoms:  Cough yes/no: No Fever (>100.17F)  yes/no: No Runny nose yes/no: No Sore throat yes/no: No Difficulty breathing/shortness of breath  yes/no: No  Have you traveled in the last 14 days and where? yes/no: No  Patient verbalized understanding of instructions that were given via phone.

## 2023-01-29 ENCOUNTER — Encounter (HOSPITAL_COMMUNITY): Payer: Self-pay | Admitting: Surgery

## 2023-01-29 ENCOUNTER — Telehealth: Payer: Self-pay | Admitting: *Deleted

## 2023-01-29 NOTE — Progress Notes (Signed)
Case: [id number] Date/Time: 01/30/23 1545   Procedure: HERNIA REPAIR UMBILICAL ADULT WITH MESH   Anesthesia type: General   Pre-op diagnosis: INCCARCERATED UMBILICAL HERNIA   [facility]: [facility]OM [facility] / [facility]   Surgeons: Abigail Miyamoto, MD       DISCUSSION: Christine [name] is a 47 year old female who is being evaluated as a same-day workup prior to surgery listed above.  Past medical history significant for hypertension, diastolic CHF, pulmnary HTN, morbid obesity, OSA (uses CPAP), chronic dyspnea on exertion, prediabetes, IDA and receives iron infusions, anxiety, depression, fibromyalgia , bipolar disorder, hiatal hernia.  Patient has a chronically incarcerated large umbilical hernia which is now scheduled for repair.  Patient saw cardiology on 01/03/2023 for preop risk assessment and cardiac clearance.  Noted to be doing well from a cardiac standpoint.  Clearance provided:  "Preoperative cardiac evaluation: Therefore, based on ACC/AHA guidelines, patient would be at acceptable risk for the planned procedure without further cardiovascular testing.  She may proceed with ventral hernia repair and colonoscopy.  No medications are recommended to be held perioperatively."  Patient follows with hematology for anemia and receives periodic IV iron. Last dose was on 12/18/22. Hgb was 11.1 on 12/04/22.   VS:     01/28/2023    2:29 PM 01/03/2023    9:10 AM 12/18/2022   10:19 AM  Vitals with BMI  Height 5\' 4"  5\' 4"    Weight 325 lbs 328 lbs   BMI 55.76 56.27   Systolic  124 137  Diastolic  78 62  Pulse  84 75     PROVIDERS: PCP - Carilyn Goodpasture, NP   Behavioral Health - Noralee Stain, Counselor Cardiologist - Dr Bryan Lemma (Cleared for surgery) Oncology - Santiago Glad, NP   LABS: Labs reviewed: Acceptable for surgery. (all labs ordered are listed, but only abnormal results are displayed)  Labs Reviewed - No data to display   IMAGES:  CXR 05/02/22:  FINDINGS: The cardiac silhouette is  upper limits of normal in size. Mild central pulmonary vascular congestion is less prominent than on the prior radiograph. No airspace consolidation, overt pulmonary edema, pleural effusion, or pneumothorax is identified. No acute osseous abnormality is seen.   IMPRESSION: Mild pulmonary vascular congestion.     EKG 01/03/23  NSR, rate 84  CV:  Echo 08/09/19:  IMPRESSIONS     1. Left ventricular ejection fraction, by visual estimation, is 60 to  65%. The left ventricle has normal function. There is no left ventricular  hypertrophy.   2. The left ventricle has no regional wall motion abnormalities.   3. Global right ventricle has normal systolic function.The right  ventricular size is normal. No increase in right ventricular wall  thickness.   4. Left atrial size was normal.   5. Right atrial size was normal.   6. The mitral valve is normal in structure. No evidence of mitral valve  regurgitation.   7. The tricuspid valve is normal in structure.   8. The aortic valve is normal in structure. Aortic valve regurgitation is  not visualized.   9. The pulmonic valve was normal in structure. Pulmonic valve  regurgitation is not visualized.  10. The atrial septum is grossly normal.   CT Coronary 04/05/2019:    IMPRESSION: 1. No evidence of CAD, CADRADS = 0.   2. Coronary calcium score of 0. This was 0 percentile for age and sex matched control.   3. Normal coronary origin with right dominance.   4. Dilated  PA at 37 mm which could suggest pulmonary hypertension.  Past Medical History:  Diagnosis Date   Anemia    w/ blood transfusion;  Presumably from menorrhagia/menomenorrhagia; has had both blood and iron transfusions   Anxiety    Back pain    no current problems as of 01/28/23   Bipolar 1 disorder (HCC)    With depression   Chest pain    Depression    Diastolic heart failure (HCC)    Dyspnea    exertion   Essential hypertension    Fibromyalgia    History of  blood transfusion 08/2017   [facility]   History of hiatal hernia    IBS (irritable bowel syndrome)    no current problems as of 01/28/23   Infertility, female    Lactose intolerance    Lower extremity edema    Migraines    Morbid obesity (HCC)    Osteoarthritis    Hands, ankles; HLA-B27 positive   PCOS (polycystic ovarian syndrome)    treated metformin   Pneumonia    x several   Pre-diabetes    5.7 hbga!c 05/02/22   Seasonal allergies    With recurrent allergic rhinitis   Sleep apnea    uses nightly   SOB (shortness of breath)    Uterine leiomyoma    Vitamin D deficiency     Past Surgical History:  Procedure Laterality Date   CARDIAC CT ANGIOGRAM  03/2019    Ca Score 0. Normal Coronary origin R dominant. No evidence of CAD. ? Liver nodules - poorly visualized (consider MRI w & w/o Gad contrast)   CERVICAL POLYPECTOMY  01/15/2018   Procedure: CERVICAL POLYPECTOMY;  Surgeon: Richardean Chimera, MD;  Location: [facility];  Service: Gynecology;;   COLONOSCOPY  2008   Normal   DIAGNOSTIC LAPAROSCOPY  08/1999   dermoid cyst, RSO   DILATION AND CURETTAGE OF UTERUS  05/2004   MAB   DILATION AND CURETTAGE OF UTERUS  04/2015   HYSTEROSCOPY WITH D & C  07/22/2011   Procedure: DILATATION AND CURETTAGE /HYSTEROSCOPY;  Surgeon: Jeani Hawking, MD;  Location: [facility];  Service: Gynecology;;   HYSTEROSCOPY WITH D & C N/A 01/15/2018   Procedure: DILATATION AND CURETTAGE Melton Krebs WITH MYOSURE;  Surgeon: Richardean Chimera, MD;  Location: [facility];  Service: Gynecology;  Laterality: N/A;   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  Suggestion of high cardiac output (possibly related to anemia thyrotoxicosis, Pregnancy, sepsis etc.) -- was in setting of symptomatic anemia - Hgb 5.7    MEDICATIONS: No current facility-administered medications for this encounter.    acetaminophen (TYLENOL) 500 MG tablet   amLODipine (NORVASC)  5 MG tablet   BELSOMRA 20 MG TABS   cariprazine (VRAYLAR) 3 MG capsule   cetirizine (ZYRTEC) 10 MG tablet   chlorthalidone (HYGROTON) 25 MG tablet   Cholecalciferol (VITAMIN D) 50 MCG (2000 UT) tablet   clonazePAM (KLONOPIN) 0.5 MG tablet   FLUoxetine (PROZAC) 10 MG capsule   fluticasone (FLONASE) 50 MCG/ACT nasal spray   lamoTRIgine (LAMICTAL) 200 MG tablet   lithium carbonate (LITHOBID) 300 MG ER tablet   meloxicam (MOBIC) 7.5 MG tablet   metFORMIN (GLUCOPHAGE) 500 MG tablet   metoprolol succinate (TOPROL-XL) 100 MG 24 hr tablet   norethindrone (MICRONOR) 0.35 MG tablet   methylPREDNISolone (MEDROL DOSEPAK) 4 MG TBPK tablet    Ubaldo Glassing,  PA-C [facility] Surgical Short Stay/Anesthesiology [facility] Phone [phone] 01/29/2023 11:36 AM

## 2023-01-29 NOTE — Anesthesia Preprocedure Evaluation (Addendum)
Anesthesia Evaluation  Patient identified by MRN, date of birth, ID band Patient awake    Reviewed: Allergy & Precautions, H&P , NPO status , Patient's Chart, lab work & pertinent test results  Airway Mallampati: III  TM Distance: >3 FB Neck ROM: Full    Dental  (+) Teeth Intact, Dental Advisory Given   Pulmonary sleep apnea and Continuous Positive Airway Pressure Ventilation    Pulmonary exam normal breath sounds clear to auscultation       Cardiovascular hypertension (145/80 preop, per pt normally 120s SBP), Normal cardiovascular exam Rhythm:Regular Rate:Normal  Echo 2021 1. Left ventricular ejection fraction, by visual estimation, is 60 to  65%. The left ventricle has normal function. There is no left ventricular  hypertrophy.   2. The left ventricle has no regional wall motion abnormalities.   3. Global right ventricle has normal systolic function.The right  ventricular size is normal. No increase in right ventricular wall  thickness.   4. Left atrial size was normal.   5. Right atrial size was normal.   6. The mitral valve is normal in structure. No evidence of mitral valve  regurgitation.   7. The tricuspid valve is normal in structure.   8. The aortic valve is normal in structure. Aortic valve regurgitation is  not visualized.   9. The pulmonic valve was normal in structure. Pulmonic valve  regurgitation is not visualized.  10. The atrial septum is grossly normal.     Neuro/Psych  Headaches PSYCHIATRIC DISORDERS Anxiety Depression Bipolar Disorder      GI/Hepatic Neg liver ROS, hiatal hernia,,,  Endo/Other  diabetes (prediabetic)  Morbid obesityBMI 56  Renal/GU negative Renal ROS  negative genitourinary   Musculoskeletal  (+) Arthritis , Osteoarthritis,  Fibromyalgia -  Abdominal  (+) + obese  Peds negative pediatric ROS (+)  Hematology  (+) Blood dyscrasia, anemia Hb 11.6   Anesthesia Other  Findings   Reproductive/Obstetrics negative OB ROS                             Anesthesia Physical Anesthesia Plan  ASA: 3  Anesthesia Plan: General   Post-op Pain Management: Tylenol PO (pre-op)* and Toradol IV (intra-op)*   Induction: Intravenous  PONV Risk Score and Plan: 4 or greater and Ondansetron, Dexamethasone, Midazolam and Treatment may vary due to age or medical condition  Airway Management Planned: Oral ETT  Additional Equipment: None  Intra-op Plan:   Post-operative Plan: Extubation in OR  Informed Consent: I have reviewed the patients History and Physical, chart, labs and discussed the procedure including the risks, benefits and alternatives for the proposed anesthesia with the patient or authorized representative who has indicated his/her understanding and acceptance.     Dental advisory given  Plan Discussed with: CRNA  Anesthesia Plan Comments:         Anesthesia Quick Evaluation

## 2023-01-29 NOTE — Telephone Encounter (Signed)
Yesi,  This pt's BMI is greater than 50; their procedure will need to be performed at the hospital.  Thanks,  Alveda Vanhorne 

## 2023-01-29 NOTE — H&P (Signed)
PROVIDER: Wayne Both, MD  MRN: [id number] DOB: 12/11/1975  Subjective  Chief Complaint: New Consultation (UMBILICAL HERNIA REPAIR WITH MESH)   History of Present Illness: Christine [name] is a 47 y.o. female who is seen  for an umbilical hernia. I saw her back in 2020 right for the pandemic and she was scheduled for umbilical hernia surgery but then had to cancel it for admission to behavioral health. The surgery was delayed multiple times because of the pandemic and she decided to hold on surgery. Since that time she has been doing much better but does report that hernias got larger. She has a history of diastolic heart failure but has not seen a cardiologist in the past 2 years. She currently denies shortness of breath or difficulty breathing. She reports no nausea or vomiting but just pain at the umbilicus and reports the hernia has gotten larger.    Review of Systems: A complete review of systems was obtained from the patient. I have reviewed this information and discussed as appropriate with the patient. See HPI as well for other ROS.  ROS  Medical History: Past Medical History: Diagnosis Date Anemia Anxiety Bipolar disorder (CMS/HHS-HCC) Diabetes mellitus without complication (CMS/HHS-HCC) Essential (primary) hypertension PCOS (polycystic ovarian syndrome) Sleep apnea  Patient Active Problem List Diagnosis Bipolar disorder (CMS/HHS-HCC) Essential (primary) hypertension PCOS (polycystic ovarian syndrome) Spells of trembling Transient alteration of awareness  Past Surgical History: Procedure Laterality Date SALPINGO OOPHORECTOMY Right 08/1999 tumor DILATION & CURRETTAGE 05/2004 DILATION & CURRETTAGE 2011 DILATION & CURRETTAGE 04/2015   Allergies Allergen Reactions Penicillin Rash  Current Outpatient Medications on File Prior to Visit Medication Sig Dispense Refill amLODIPine (NORVASC) 10 MG tablet Take 5 mg by mouth once daily. 6 CAPLYTA 42 mg  capsule Take by mouth chlorthalidone 25 MG tablet TAKE 1/2 TABLET(12.5 MG) BY MOUTH DAILY cholecalciferol, vitamin D3, (VITAMIN D3) 5,000 unit Tab Take 1 tablet by mouth once daily. clonazePAM (KLONOPIN) 0.5 MG tablet Take by mouth FERROUS SULFATE (SLOW RELEASE IRON ORAL) Take 50 mg by mouth once daily. fluticasone (FLONASE) 50 mcg/actuation nasal spray Place 2 sprays into both nostrils once daily. ibuprofen (ADVIL,MOTRIN) 800 MG tablet TK 1 T PO Q 8 H PRF HA 0 lamoTRIgine (LAMICTAL) 150 MG tablet Take by mouth lithium carbonate 300 MG ER tablet Take 600 mg by mouth 2 (two) times daily meclizine (ANTIVERT) 25 mg tablet TK 1 T PO Q 4 TO 6 H PRF DIZZINESS 2 metFORMIN (GLUCOPHAGE-XR) 750 MG XR tablet Reported on 11/20/2016 9 metoprolol succinate (TOPROL-XL) 100 MG XL tablet TAKE 1 TABLET BY MOUTH DAILY WITH OR IMMEDIATELY FOLLOWING A MEAL ondansetron (ZOFRAN) 4 MG tablet Reported on 12/14/2015 2 tranexamic acid (LYSTEDA) 650 mg tablet Reported on 12/14/2015 2  No current facility-administered medications on file prior to visit.  Family History Problem Relation Age of Onset High blood pressure (Hypertension) Mother High blood pressure (Hypertension) Father Stroke Father High blood pressure (Hypertension) Brother Stroke Brother Seizures Neg Hx Movement disorder Neg Hx   Social History  Tobacco Use Smoking Status Never Smokeless Tobacco Never   Social History  Socioeconomic History Marital status: Married Tobacco Use Smoking status: Never Smokeless tobacco: Never Substance and Sexual Activity Alcohol use: Yes Comment: ocassionally Drug use: No Sexual activity: Defer  Social Determinants of Health  Financial Resource Strain: Medium Risk (09/10/2017) Received from [facility], [place] Overall Financial Resource Strain (CARDIA) Difficulty of Paying Living Expenses: Somewhat hard Food Insecurity: Food Insecurity Present (09/10/2017) Received from [facility], [facility] Hunger Vital Sign Worried About Running Out of Food in the Last Year: Sometimes true Ran Out of Food in the Last Year: Sometimes true Transportation Needs: No Transportation Needs (09/10/2017) Received from [facility], [facility] - Transportation Lack of Transportation (Medical): No Lack of Transportation (Non-Medical): No Physical Activity: Unknown (09/10/2017) Received from [facility], [place] Exercise Vital Sign Days of Exercise per Week: 0 days Stress: Stress Concern Present (09/10/2017) Received from [facility], [facility] of Occupational Health - Occupational Stress Questionnaire Feeling of Stress : Rather much Social Connections: Unknown (09/10/2017) Received from [facility], [place] Social Connection and Isolation Panel [NHANES] Frequency of Communication with Friends and Family: Never Frequency of Social Gatherings with Friends and Family: Never Attends Religious Services: Never Database administrator or Organizations: No Attends Engineer, structural: Never  Objective:  Vitals:  BP: (!) 157/89 Pulse: 82 Temp: 36.7 C (98 F) SpO2: 98% Weight: (!) 148.2 kg (326 lb 12.8 oz) Height: 165.1 cm (5\' 5" ) PainSc: 0-No pain PainLoc: Abdomen  Body mass index is 54.38 kg/m.  Physical Exam  She appears well on exam\  Her abdomen is obese  There is a large umbilical hernia which is chronically incarcerated and mildly tender with no skin changes. The fascial defect is likely 4 cm. She also has rectus diastases  Labs, Imaging and Diagnostic Testing:  I reviewed the notes in her electronic medical records  Assessment and Plan:  Diagnoses and all orders for this visit:  Incarcerated umbilical hernia    I again explained the umbilical hernia to her and she is now interested in repair as the hernia is getting bigger. I also discussed rectus diastases with her. I discussed proceeding with an open umbilical hernia repair  with mesh. I explained the surgical procedure in detail. We discussed the risks which includes but is not limited to bleeding, infection, injury to surrounding structures, use of mesh, hernia recurrence, cardiopulmonary issues with surgery, postoperative recovery, etc. We will try to get cardiac clearance preoperatively as well. She understands and wishes to proceed with surgery which will be scheduled

## 2023-01-30 ENCOUNTER — Ambulatory Visit (HOSPITAL_BASED_OUTPATIENT_CLINIC_OR_DEPARTMENT_OTHER): Payer: Medicare Other | Admitting: Medical

## 2023-01-30 ENCOUNTER — Other Ambulatory Visit: Payer: Self-pay

## 2023-01-30 ENCOUNTER — Encounter (HOSPITAL_COMMUNITY)
Admission: RE | Disposition: A | Discharge status: Home / Self Care | Payer: Self-pay | Source: Home / Self Care | Attending: Surgery

## 2023-01-30 ENCOUNTER — Ambulatory Visit (HOSPITAL_COMMUNITY): Payer: Medicare Other | Admitting: Medical

## 2023-01-30 ENCOUNTER — Ambulatory Visit (HOSPITAL_COMMUNITY)
Admission: RE | Admit: 2023-01-30 | Discharge: 2023-01-30 | Disposition: A | Discharge status: Home / Self Care | Payer: Medicare Other | Attending: Surgery | Admitting: Surgery

## 2023-01-30 ENCOUNTER — Encounter (HOSPITAL_COMMUNITY): Payer: Self-pay | Admitting: Surgery

## 2023-01-30 DIAGNOSIS — K420 Umbilical hernia with obstruction, without gangrene: Secondary | ICD-10-CM

## 2023-01-30 DIAGNOSIS — Z79899 Other long term (current) drug therapy: Secondary | ICD-10-CM | POA: Diagnosis not present

## 2023-01-30 DIAGNOSIS — F319 Bipolar disorder, unspecified: Secondary | ICD-10-CM | POA: Diagnosis not present

## 2023-01-30 DIAGNOSIS — E119 Type 2 diabetes mellitus without complications: Secondary | ICD-10-CM | POA: Insufficient documentation

## 2023-01-30 DIAGNOSIS — G4733 Obstructive sleep apnea (adult) (pediatric): Secondary | ICD-10-CM | POA: Diagnosis not present

## 2023-01-30 DIAGNOSIS — I5032 Chronic diastolic (congestive) heart failure: Secondary | ICD-10-CM | POA: Diagnosis not present

## 2023-01-30 DIAGNOSIS — D649 Anemia, unspecified: Secondary | ICD-10-CM | POA: Diagnosis not present

## 2023-01-30 DIAGNOSIS — I110 Hypertensive heart disease with heart failure: Secondary | ICD-10-CM | POA: Diagnosis not present

## 2023-01-30 DIAGNOSIS — M797 Fibromyalgia: Secondary | ICD-10-CM | POA: Diagnosis not present

## 2023-01-30 DIAGNOSIS — Z7984 Long term (current) use of oral hypoglycemic drugs: Secondary | ICD-10-CM | POA: Insufficient documentation

## 2023-01-30 DIAGNOSIS — Z01818 Encounter for other preprocedural examination: Secondary | ICD-10-CM

## 2023-01-30 DIAGNOSIS — I10 Essential (primary) hypertension: Secondary | ICD-10-CM | POA: Diagnosis not present

## 2023-01-30 DIAGNOSIS — F419 Anxiety disorder, unspecified: Secondary | ICD-10-CM | POA: Insufficient documentation

## 2023-01-30 DIAGNOSIS — M1990 Unspecified osteoarthritis, unspecified site: Secondary | ICD-10-CM | POA: Insufficient documentation

## 2023-01-30 DIAGNOSIS — Z9989 Dependence on other enabling machines and devices: Secondary | ICD-10-CM | POA: Diagnosis not present

## 2023-01-30 DIAGNOSIS — Z6841 Body Mass Index (BMI) 40.0 and over, adult: Secondary | ICD-10-CM | POA: Diagnosis not present

## 2023-01-30 DIAGNOSIS — G4730 Sleep apnea, unspecified: Secondary | ICD-10-CM | POA: Diagnosis not present

## 2023-01-30 HISTORY — DX: Prediabetes: R73.03

## 2023-01-30 HISTORY — DX: Pneumonia, unspecified organism: J18.9

## 2023-01-30 HISTORY — PX: UMBILICAL HERNIA REPAIR: SHX196

## 2023-01-30 LAB — BASIC METABOLIC PANEL
Anion gap: 10 (ref 5–15)
BUN: 10 mg/dL (ref 6–20)
CO2: 21 mmol/L — ABNORMAL LOW (ref 22–32)
Calcium: 9.3 mg/dL (ref 8.9–10.3)
Chloride: 107 mmol/L (ref 98–111)
Creatinine, Ser: 0.88 mg/dL (ref 0.44–1.00)
GFR, Estimated: >60 mL/min (ref >60)
Glucose, Bld: 95 mg/dL (ref 70–99)
Potassium: 3.3 mmol/L — ABNORMAL LOW (ref 3.5–5.1)
Sodium: 138 mmol/L (ref 135–145)

## 2023-01-30 LAB — CBC
HCT: 37.8 % (ref 36.0–46.0)
Hemoglobin: 11.6 g/dL — ABNORMAL LOW (ref 12.0–15.0)
MCH: 27.8 pg (ref 26.0–34.0)
MCHC: 30.7 g/dL (ref 30.0–36.0)
MCV: 90.4 fL (ref 80.0–100.0)
Platelets: 346 10*3/uL (ref 150–400)
RBC: 4.18 MIL/uL (ref 3.87–5.11)
RDW: 16.3 % — ABNORMAL HIGH (ref 11.5–15.5)
WBC: 9 10*3/uL (ref 4.0–10.5)
nRBC: 0 % (ref 0.0–0.2)

## 2023-01-30 LAB — POCT PREGNANCY, URINE: Preg Test, Ur: NEGATIVE

## 2023-01-30 SURGERY — REPAIR, HERNIA, UMBILICAL, ADULT
Anesthesia: General | Site: Abdomen

## 2023-01-30 MED ORDER — OXYCODONE HCL 5 MG PO TABS: 5.0000 mg | ORAL_TABLET | Freq: Four times a day (QID) | ORAL | 0 refills | Status: DC | PRN

## 2023-01-30 MED ORDER — MEPERIDINE HCL 25 MG/ML IJ SOLN: 6.2500 mg | INTRAMUSCULAR | Status: DC | PRN

## 2023-01-30 MED ORDER — SUGAMMADEX SODIUM 200 MG/2ML IV SOLN
INTRAVENOUS | Status: DC | PRN
  Administered 2023-01-30: 294.8 mg via INTRAVENOUS

## 2023-01-30 MED ORDER — OXYCODONE HCL 5 MG PO TABS
5.0000 mg | ORAL_TABLET | Freq: Once | ORAL | Status: AC | PRN
  Administered 2023-01-30: 5 mg via ORAL

## 2023-01-30 MED ORDER — DEXAMETHASONE SODIUM PHOSPHATE 10 MG/ML IJ SOLN
INTRAMUSCULAR | Status: AC
  Filled 2023-01-30: qty 1

## 2023-01-30 MED ORDER — ORAL CARE MOUTH RINSE: 15.0000 mL | Freq: Once | OROMUCOSAL | Status: AC

## 2023-01-30 MED ORDER — CIPROFLOXACIN IN D5W 400 MG/200ML IV SOLN
400.0000 mg | INTRAVENOUS | Status: AC
  Administered 2023-01-30: 400 mg via INTRAVENOUS

## 2023-01-30 MED ORDER — KETOROLAC TROMETHAMINE 30 MG/ML IJ SOLN
30.0000 mg | Freq: Once | INTRAMUSCULAR | Status: AC | PRN
  Administered 2023-01-30: 30 mg via INTRAVENOUS

## 2023-01-30 MED ORDER — ROCURONIUM BROMIDE 100 MG/10ML IV SOLN
INTRAVENOUS | Status: DC | PRN
  Administered 2023-01-30: 40 mg via INTRAVENOUS
  Administered 2023-01-30: 60 mg via INTRAVENOUS

## 2023-01-30 MED ORDER — PROPOFOL 10 MG/ML IV BOLUS
INTRAVENOUS | Status: AC
  Filled 2023-01-30: qty 20

## 2023-01-30 MED ORDER — DEXAMETHASONE SODIUM PHOSPHATE 10 MG/ML IJ SOLN
INTRAMUSCULAR | Status: DC | PRN
  Administered 2023-01-30: 5 mg via INTRAVENOUS

## 2023-01-30 MED ORDER — ONDANSETRON HCL 4 MG/2ML IJ SOLN
INTRAMUSCULAR | Status: DC | PRN
  Administered 2023-01-30: 4 mg via INTRAVENOUS

## 2023-01-30 MED ORDER — KETOROLAC TROMETHAMINE 30 MG/ML IJ SOLN
INTRAMUSCULAR | Status: AC
  Filled 2023-01-30: qty 1

## 2023-01-30 MED ORDER — PROPOFOL 10 MG/ML IV BOLUS
INTRAVENOUS | Status: DC | PRN
  Administered 2023-01-30: 250 mg via INTRAVENOUS

## 2023-01-30 MED ORDER — AMISULPRIDE (ANTIEMETIC) 5 MG/2ML IV SOLN: 10.0000 mg | Freq: Once | INTRAVENOUS | Status: DC | PRN

## 2023-01-30 MED ORDER — MIDAZOLAM HCL 2 MG/2ML IJ SOLN
INTRAMUSCULAR | Status: AC
  Filled 2023-01-30: qty 2

## 2023-01-30 MED ORDER — CHLORHEXIDINE GLUCONATE 0.12 % MT SOLN: 15.0000 mL | Freq: Once | OROMUCOSAL | Status: AC

## 2023-01-30 MED ORDER — ACETAMINOPHEN 500 MG PO TABS
ORAL_TABLET | ORAL | Status: AC
  Administered 2023-01-30: 1000 mg via ORAL
  Filled 2023-01-30: qty 2

## 2023-01-30 MED ORDER — OXYCODONE HCL 5 MG PO TABS
ORAL_TABLET | ORAL | Status: AC
  Filled 2023-01-30: qty 1

## 2023-01-30 MED ORDER — CHLORHEXIDINE GLUCONATE 0.12 % MT SOLN
OROMUCOSAL | Status: AC
  Administered 2023-01-30: 15 mL via OROMUCOSAL
  Filled 2023-01-30: qty 15

## 2023-01-30 MED ORDER — 0.9 % SODIUM CHLORIDE (POUR BTL) OPTIME
TOPICAL | Status: DC | PRN
  Administered 2023-01-30: 1000 mL

## 2023-01-30 MED ORDER — FENTANYL CITRATE (PF) 100 MCG/2ML IJ SOLN
INTRAMUSCULAR | Status: DC | PRN
  Administered 2023-01-30 (×2): 50 ug via INTRAVENOUS

## 2023-01-30 MED ORDER — ROCURONIUM BROMIDE 10 MG/ML (PF) SYRINGE
PREFILLED_SYRINGE | INTRAVENOUS | Status: AC
  Filled 2023-01-30: qty 10

## 2023-01-30 MED ORDER — LACTATED RINGERS IV SOLN: INTRAVENOUS | Status: DC

## 2023-01-30 MED ORDER — LIDOCAINE 2% (20 MG/ML) 5 ML SYRINGE
INTRAMUSCULAR | Status: AC
  Filled 2023-01-30: qty 5

## 2023-01-30 MED ORDER — ACETAMINOPHEN 500 MG PO TABS: 1000.0000 mg | ORAL_TABLET | Freq: Once | ORAL | Status: AC

## 2023-01-30 MED ORDER — CIPROFLOXACIN IN D5W 400 MG/200ML IV SOLN
INTRAVENOUS | Status: AC
  Filled 2023-01-30: qty 200

## 2023-01-30 MED ORDER — CHLORHEXIDINE GLUCONATE CLOTH 2 % EX PADS: 6.0000 | MEDICATED_PAD | Freq: Once | CUTANEOUS | Status: DC

## 2023-01-30 MED ORDER — CHLORHEXIDINE GLUCONATE CLOTH 2 % EX PADS
6.0000 | MEDICATED_PAD | Freq: Once | CUTANEOUS | Status: AC
  Administered 2023-01-30: 6 via TOPICAL

## 2023-01-30 MED ORDER — ACETAMINOPHEN 500 MG PO TABS: 1000.0000 mg | ORAL_TABLET | ORAL | Status: DC

## 2023-01-30 MED ORDER — ONDANSETRON HCL 4 MG/2ML IJ SOLN: 4.0000 mg | Freq: Once | INTRAMUSCULAR | Status: DC | PRN

## 2023-01-30 MED ORDER — BUPIVACAINE-EPINEPHRINE 0.25% -1:200000 IJ SOLN
INTRAMUSCULAR | Status: DC | PRN
  Administered 2023-01-30: 20 mL

## 2023-01-30 MED ORDER — SUCCINYLCHOLINE CHLORIDE 20 MG/ML IJ SOLN
INTRAMUSCULAR | Status: DC | PRN
  Administered 2023-01-30: 200 mg via INTRAVENOUS

## 2023-01-30 MED ORDER — FENTANYL CITRATE (PF) 250 MCG/5ML IJ SOLN
INTRAMUSCULAR | Status: AC
  Filled 2023-01-30: qty 5

## 2023-01-30 MED ORDER — OXYCODONE HCL 5 MG/5ML PO SOLN: 5.0000 mg | Freq: Once | ORAL | Status: AC | PRN

## 2023-01-30 MED ORDER — MIDAZOLAM HCL 5 MG/5ML IJ SOLN
INTRAMUSCULAR | Status: DC | PRN
  Administered 2023-01-30: 2 mg via INTRAVENOUS

## 2023-01-30 MED ORDER — HYDROMORPHONE HCL 1 MG/ML IJ SOLN: 0.2500 mg | INTRAMUSCULAR | Status: DC | PRN

## 2023-01-30 MED ORDER — BUPIVACAINE-EPINEPHRINE (PF) 0.25% -1:200000 IJ SOLN
INTRAMUSCULAR | Status: AC
  Filled 2023-01-30: qty 30

## 2023-01-30 SURGICAL SUPPLY — 36 items
ADH SKN CLS APL DERMABOND .7 (GAUZE/BANDAGES/DRESSINGS) ×1
APL PRP STRL LF DISP 70% ISPRP (MISCELLANEOUS) ×1
BAG COUNTER SPONGE SURGICOUNT (BAG) IMPLANT
BAG SPNG CNTER NS LX DISP (BAG)
BLADE CLIPPER SURG (BLADE) IMPLANT
CANISTER SUCT 3000ML PPV (MISCELLANEOUS) IMPLANT
CHLORAPREP W/TINT 26 (MISCELLANEOUS) ×1 IMPLANT
COVER SURGICAL LIGHT HANDLE (MISCELLANEOUS) ×1 IMPLANT
DERMABOND ADVANCED .7 DNX12 (GAUZE/BANDAGES/DRESSINGS) ×1 IMPLANT
DRAPE LAPAROTOMY 100X72 PEDS (DRAPES) ×1 IMPLANT
ELECT REM PT RETURN 9FT ADLT (ELECTROSURGICAL) ×1
ELECTRODE REM PT RTRN 9FT ADLT (ELECTROSURGICAL) ×1 IMPLANT
GLOVE SURG SIGNA 7.5 PF LTX (GLOVE) ×1 IMPLANT
GOWN STRL REUS W/ TWL LRG LVL3 (GOWN DISPOSABLE) ×1 IMPLANT
GOWN STRL REUS W/ TWL XL LVL3 (GOWN DISPOSABLE) ×1 IMPLANT
GOWN STRL REUS W/TWL LRG LVL3 (GOWN DISPOSABLE) ×1
GOWN STRL REUS W/TWL XL LVL3 (GOWN DISPOSABLE) ×1
KIT BASIN OR (CUSTOM PROCEDURE TRAY) ×1 IMPLANT
KIT TURNOVER KIT B (KITS) ×1 IMPLANT
MESH VENTRALEX ST 2.5 CRC MED (Mesh General) IMPLANT
NDL HYPO 25GX1X1/2 BEV (NEEDLE) ×1 IMPLANT
NEEDLE HYPO 25GX1X1/2 BEV (NEEDLE) ×1 IMPLANT
NS IRRIG 1000ML POUR BTL (IV SOLUTION) ×1 IMPLANT
PACK GENERAL/GYN (CUSTOM PROCEDURE TRAY) ×1 IMPLANT
PAD ARMBOARD 7.5X6 YLW CONV (MISCELLANEOUS) ×1 IMPLANT
PENCIL SMOKE EVACUATOR (MISCELLANEOUS) ×1 IMPLANT
SPIKE FLUID TRANSFER (MISCELLANEOUS) ×1 IMPLANT
SUT MNCRL AB 4-0 PS2 18 (SUTURE) ×1 IMPLANT
SUT NOVA 1 T20/GS 25DT (SUTURE) IMPLANT
SUT NOVA NAB DX-16 0-1 5-0 T12 (SUTURE) ×1 IMPLANT
SUT VIC AB 3-0 SH 27 (SUTURE) ×2
SUT VIC AB 3-0 SH 27X BRD (SUTURE) ×1 IMPLANT
SUT VIC AB 3-0 SH 27XBRD (SUTURE) IMPLANT
SYR CONTROL 10ML LL (SYRINGE) ×1 IMPLANT
TOWEL GREEN STERILE (TOWEL DISPOSABLE) ×1 IMPLANT
TOWEL GREEN STERILE FF (TOWEL DISPOSABLE) ×1 IMPLANT

## 2023-01-30 NOTE — Transfer of Care (Signed)
Immediate Anesthesia Transfer of Care Note  Patient: Christine [name]  Procedure(s) Performed: HERNIA REPAIR UMBILICAL ADULT WITH MESH (Abdomen)  Patient Location: PACU  Anesthesia Type:General  Level of Consciousness: awake and oriented  Airway & Oxygen Therapy: Patient Spontanous Breathing and Patient connected to face mask oxygen  Post-op Assessment: Report given to RN  Post vital signs: Reviewed and stable  Last Vitals:  Vitals Value Taken Time  BP 133/70 01/30/23 1645  Temp    Pulse 71 01/30/23 1645  Resp 28 01/30/23 1645  SpO2 91 % 01/30/23 1645  Vitals shown include unvalidated device data.  Last Pain:  Vitals:   01/30/23 1407  TempSrc:   PainSc: 0-No pain         Complications: There were no known notable events for this encounter.

## 2023-01-30 NOTE — Op Note (Signed)
   Christine [name] 01/30/2023   Pre-op Diagnosis: INCCARCERATED UMBILICAL HERNIA (2 cm fascial defect)     Post-op Diagnosis: same  Procedure(s): HERNIA REPAIR UMBILICAL ADULT WITH MESH (6.4 round ventral patch)  Surgeon(s): Abigail Miyamoto, MD  Anesthesia: General  Staff:  Circulator: Oswaldo Conroy, RN Scrub Person: Margo Aye, Amy T, RN  Estimated Blood Loss: Minimal               Findings: The patient was found to have a 2 cm fascial defect with omentum incarcerated in the hernia sac.  The hernia was repaired with a 6.4 round ventral Prolene patch from [facility]  Procedure: The patient was brought to the operating identified as a correct patient.  She was placed upon on the operating room table and general anesthesia was induced.  Her abdomen was prepped and draped in usual sterile fashion.  I anesthetized skin above the umbilicus with Marcaine and then made a transverse incision at the upper edge of the umbilicus with a scalpel.  I then dissected down to the hernia sac which I was able to separate from the overlying umbilical skin.  There was a large sac but the fascial defect itself was only 2 cm.  I excised the sac in its entirety.  I was then able to easily reduce the omentum back into the abdominal cavity.  I then brought a 6.4 cm round ventral Prolene patch onto the field.  It was placed through the fascial opening and then pulled up against the peritoneum with the ties.  I sutured the mesh in place with interrupted #1 Novafil sutures.  I then cut the ties and closed the fascia over the top of the mesh with a figure-of-eight and interrupted #1 Novafil suture.  Wide coverage of the fascial defect appeared to be achieved and good closure was achieved of the fascia over the mesh.  I anesthetized the fascia with Marcaine.  Hemostasis was achieved with the cautery.  I tacked the umbilical skin back in place with 3-0 Vicryl sutures.  I then closed the subcutaneous tissue with interrupted 3-0  Vicryl sutures and closed the skin with a running 4-0 Monocryl.  Dermabond was then applied.  The patient tolerated the procedure well.  All the counts were correct at the end of the procedure.  The patient was then extubated in the operating room and taken in a stable condition to the recovery room.          Abigail Miyamoto   Date: 01/30/2023  Time: 4:35 PM

## 2023-01-30 NOTE — Anesthesia Procedure Notes (Signed)
Procedure Name: Intubation Date/Time: 01/30/2023 4:03 PM  Performed by: Darryl Nestle, CRNAPre-anesthesia Checklist: Patient identified, Emergency Drugs available, Suction available and Patient being monitored Patient Re-evaluated:Patient Re-evaluated prior to induction Oxygen Delivery Method: Circle system utilized Preoxygenation: Pre-oxygenation with 100% oxygen Induction Type: IV induction Ventilation: Mask ventilation with difficulty, Two handed mask ventilation required and Oral airway inserted - appropriate to patient size Laryngoscope Size: Mac and 3 Grade View: Grade I Tube type: Oral Tube size: 7.0 mm Number of attempts: 1 Airway Equipment and Method: Stylet and Oral airway Placement Confirmation: ETT inserted through vocal cords under direct vision, positive ETCO2 and breath sounds checked- equal and bilateral Secured at: 21 cm Tube secured with: Tape Dental Injury: Teeth and Oropharynx as per pre-operative assessment

## 2023-01-30 NOTE — Interval H&P Note (Signed)
History and Physical Interval Note: no change in H and P  01/30/2023 2:10 PM  Christine [name]  has presented today for surgery, with the diagnosis of INCCARCERATED UMBILICAL HERNIA.  The various methods of treatment have been discussed with the patient and family. After consideration of risks, benefits and other options for treatment, the patient has consented to  Procedure(s): HERNIA REPAIR UMBILICAL ADULT WITH MESH (N/A) as a surgical intervention.  The patient's history has been reviewed, patient examined, no change in status, stable for surgery.  I have reviewed the patient's chart and labs.  Questions were answered to the patient's satisfaction.     Abigail Miyamoto

## 2023-01-30 NOTE — Discharge Instructions (Signed)
[facility] _______[facility]  UMBILICAL OR INGUINAL HERNIA REPAIR: POST OP INSTRUCTIONS  Always review your discharge instruction sheet given to you by the facility where your surgery was performed. IF YOU HAVE DISABILITY OR FAMILY LEAVE FORMS, YOU MUST BRING THEM TO THE OFFICE FOR PROCESSING.   DO NOT GIVE THEM TO YOUR DOCTOR.  1. A  prescription for pain medication may be given to you upon discharge.  Take your pain medication as prescribed, if needed.  If narcotic pain medicine is not needed, then you may take acetaminophen (Tylenol) or ibuprofen (Advil) as needed. 2. Take your usually prescribed medications unless otherwise directed. If you need a refill on your pain medication, please contact your pharmacy.  They will contact our office to request authorization. Prescriptions will not be filled after 5 pm or on week-ends. 3. You should follow a light diet the first 24 hours after arrival home, such as soup and crackers, etc.  Be sure to include lots of fluids daily.  Resume your normal diet the day after surgery. 4.Most patients will experience some swelling and bruising around the umbilicus or in the groin and scrotum.  Ice packs and reclining will help.  Swelling and bruising can take several days to resolve.  6. It is common to experience some constipation if taking pain medication after surgery.  Increasing fluid intake and taking a stool softener (such as Colace) will usually help or prevent this problem from occurring.  A mild laxative (Milk of Magnesia or Miralax) should be taken according to package directions if there are no bowel movements after 48 hours. 7. Unless discharge instructions indicate otherwise, you may remove your bandages 24-48 hours after surgery, and you may shower at that time.  You may have steri-strips (small skin tapes) in place directly over the incision.  These strips should be left on the skin for 7-10 days.  If your surgeon used skin glue on the  incision, you may shower in 24 hours.  The glue will flake off over the next 2-3 weeks.  Any sutures or staples will be removed at the office during your follow-up visit. 8. ACTIVITIES:  You may resume regular (light) daily activities beginning the next day--such as daily self-care, walking, climbing stairs--gradually increasing activities as tolerated.  You may have sexual intercourse when it is comfortable.  Refrain from any heavy lifting or straining until approved by your doctor.  a.You may drive when you are no longer taking prescription pain medication, you can comfortably wear a seatbelt, and you can safely maneuver your car and apply brakes. b.RETURN TO WORK:   _____________________________________________  9.You should see your doctor in the office for a follow-up appointment approximately 2-3 weeks after your surgery.  Make sure that you call for this appointment within a day or two after you arrive home to insure a convenient appointment time. 10.OTHER INSTRUCTIONS: __OK TO SHOWER STARTING TOMORROW ICE PACK, TYLENOL, AND IBUPROFEN ALSO FOR PAIN NO LIFTING MORE THAN 15 POUNDS FOR 6 WEEKS_______________________    _____________________________________  WHEN TO CALL YOUR DOCTOR: Fever over 101.0 Inability to urinate Nausea and/or vomiting Extreme swelling or bruising Continued bleeding from incision. Increased pain, redness, or drainage from the incision  The clinic staff is available to answer your questions during regular business hours.  Please don't hesitate to call and ask to speak to one of the nurses for clinical concerns.  If you have a medical emergency, go to the nearest emergency room or call 911.    A surgeon from [facility] is always on call at the hospital   [address], [address], [place], [place]  [address] ?  [address] [address], [place], [place]   [address] [phone] ? [phone] ? FAX [phone] Web site: [email or web address]  

## 2023-01-30 NOTE — Anesthesia Postprocedure Evaluation (Signed)
Anesthesia Post Note  Patient: Christine [name]  Procedure(s) Performed: HERNIA REPAIR UMBILICAL ADULT WITH MESH (Abdomen)     Patient location during evaluation: PACU Anesthesia Type: General Level of consciousness: awake and alert, oriented and patient cooperative Pain management: pain level controlled Vital Signs Assessment: post-procedure vital signs reviewed and stable Respiratory status: spontaneous breathing, nonlabored ventilation and respiratory function stable Cardiovascular status: blood pressure returned to baseline and stable Postop Assessment: no apparent nausea or vomiting Anesthetic complications: no   There were no known notable events for this encounter.  Last Vitals:  Vitals:   01/30/23 1700 01/30/23 1715  BP: 138/73 133/67  Pulse: 69 64  Resp: 20 (!) 29  Temp:  36.7 C  SpO2: 95% 92%    Last Pain:  Vitals:   01/30/23 1715  TempSrc:   PainSc: 2                  Lannie Fields

## 2023-01-30 NOTE — Progress Notes (Addendum)
1455 Pt is A&O x4, pleasant and cooperative.  Pre-procedure checklist completed with pt.  Suicide risk assessment screening completed with pt. Pt is low risk suicide precaution. Dr Barrie Dunker notified. No orders.  [name] came back to the room with the pt. 1550 Hand off was done to CRNA, pt to OR now. Pt is on low risk suicide precaution. PACU charge nurse Mortimer Fries notified.

## 2023-01-31 ENCOUNTER — Encounter (HOSPITAL_COMMUNITY): Payer: Self-pay | Admitting: Surgery

## 2023-02-03 ENCOUNTER — Inpatient Hospital Stay: Payer: Medicare Other

## 2023-02-03 ENCOUNTER — Ambulatory Visit (AMBULATORY_SURGERY_CENTER): Payer: Medicare Other

## 2023-02-03 VITALS — Ht 64.0 in | Wt 325.0 lb

## 2023-02-03 DIAGNOSIS — E559 Vitamin D deficiency, unspecified: Secondary | ICD-10-CM

## 2023-02-03 DIAGNOSIS — D509 Iron deficiency anemia, unspecified: Secondary | ICD-10-CM

## 2023-02-03 DIAGNOSIS — E538 Deficiency of other specified B group vitamins: Secondary | ICD-10-CM

## 2023-02-03 MED ORDER — NA SULFATE-K SULFATE-MG SULF 17.5-3.13-1.6 GM/177ML PO SOLN: 1.0000 | Freq: Once | ORAL | 0 refills | Status: AC

## 2023-02-03 NOTE — Progress Notes (Signed)
No egg or soy allergy known to patient  No issues known to pt with past sedation with any surgeries or procedures Patient denies ever being told they had issues or difficulty with intubation  No FH of Malignant Hyperthermia Pt is not on diet pills Pt is not on  home 02  Pt is not on blood thinners  Pt denies issues with constipation  No A fib or A flutter Have any cardiac testing pending--no Patient's chart reviewed by John Nulty CNRA prior to previsit and patient appropriate for the [facility].  Previsit completed and red dot placed by patient's name on their procedure day (on provider's schedule).    Ambulates independently Pt instructed to use Single[employer].com or GoodRx for a price reduction on prep   

## 2023-02-05 ENCOUNTER — Telehealth: Payer: Self-pay | Admitting: Cardiology

## 2023-02-05 DIAGNOSIS — N898 Other specified noninflammatory disorders of vagina: Secondary | ICD-10-CM | POA: Diagnosis not present

## 2023-02-05 DIAGNOSIS — N921 Excessive and frequent menstruation with irregular cycle: Secondary | ICD-10-CM | POA: Diagnosis not present

## 2023-02-05 MED ORDER — HYDROCHLOROTHIAZIDE 12.5 MG PO CAPS: ORAL_CAPSULE | ORAL | 3 refills | Status: DC

## 2023-02-05 NOTE — Telephone Encounter (Signed)
Patient returned RN's call. 

## 2023-02-05 NOTE — Telephone Encounter (Signed)
Called pt she states she has surgery last Thursday. The swelling has been going on for 2 days, "it comes and goes". Right ankle and lower leg has been more swollen that the left. Pt had unbiblical repair 6/27. Pt states she was told to call the cardiology office if she experiences swelling due to her cardiac history. Pt denies pain, SOB. Will get message to provider to advise.

## 2023-02-05 NOTE — Telephone Encounter (Signed)
Called pt, relayed message. Prescription sent to pharmacy. Pt thanked me for calling her back.

## 2023-02-05 NOTE — Telephone Encounter (Signed)
Patient states that since her surgery, her legs and ankles have gotten pretty swollen. Calling to see what she would need to do. Please advise

## 2023-02-05 NOTE — Telephone Encounter (Signed)
Call to patient. LVM to call office 

## 2023-02-07 DIAGNOSIS — F319 Bipolar disorder, unspecified: Secondary | ICD-10-CM | POA: Diagnosis not present

## 2023-02-12 ENCOUNTER — Other Ambulatory Visit: Payer: Self-pay | Admitting: Cardiology

## 2023-02-13 ENCOUNTER — Other Ambulatory Visit: Payer: Self-pay | Admitting: Podiatry

## 2023-02-13 DIAGNOSIS — M7661 Achilles tendinitis, right leg: Secondary | ICD-10-CM

## 2023-02-14 DIAGNOSIS — F319 Bipolar disorder, unspecified: Secondary | ICD-10-CM | POA: Diagnosis not present

## 2023-02-20 ENCOUNTER — Ambulatory Visit: Payer: Medicare Other | Admitting: Podiatry

## 2023-02-21 DIAGNOSIS — F319 Bipolar disorder, unspecified: Secondary | ICD-10-CM | POA: Diagnosis not present

## 2023-02-24 ENCOUNTER — Inpatient Hospital Stay: Payer: Medicare Other | Attending: Nurse Practitioner

## 2023-02-25 DIAGNOSIS — Z09 Encounter for follow-up examination after completed treatment for conditions other than malignant neoplasm: Secondary | ICD-10-CM | POA: Diagnosis not present

## 2023-02-28 DIAGNOSIS — F319 Bipolar disorder, unspecified: Secondary | ICD-10-CM | POA: Diagnosis not present

## 2023-03-07 ENCOUNTER — Ambulatory Visit: Payer: Medicare Other | Admitting: Podiatry

## 2023-03-07 ENCOUNTER — Encounter: Payer: Self-pay | Admitting: Podiatry

## 2023-03-07 DIAGNOSIS — M7661 Achilles tendinitis, right leg: Secondary | ICD-10-CM | POA: Diagnosis not present

## 2023-03-07 DIAGNOSIS — M7662 Achilles tendinitis, left leg: Secondary | ICD-10-CM

## 2023-03-07 MED ORDER — METHYLPREDNISOLONE 4 MG PO TBPK: ORAL_TABLET | ORAL | 0 refills | Status: DC

## 2023-03-07 MED ORDER — MELOXICAM 15 MG PO TABS
15.0000 mg | ORAL_TABLET | Freq: Every day | ORAL | 0 refills | Status: DC
Start: 2023-03-07 — End: 2024-02-11

## 2023-03-07 NOTE — Patient Instructions (Signed)
Achilles Tendinitis  with Rehab Achilles tendinitis is a disorder of the Achilles tendon. The Achilles tendon connects the large calf muscles (Gastrocnemius and Soleus) to the heel bone (calcaneus). This tendon is sometimes called the heel cord. It is important for pushing-off and standing on your toes and is important for walking, running, or jumping. Tendinitis is often caused by overuse and repetitive microtrauma. SYMPTOMS  Pain, tenderness, swelling, warmth, and redness may occur over the Achilles tendon even at rest.  Pain with pushing off, or flexing or extending the ankle.  Pain that is worsened after or during activity. CAUSES   Overuse sometimes seen with rapid increase in exercise programs or in sports requiring running and jumping.  Poor physical conditioning (strength and flexibility or endurance).  Running sports, especially training running down hills.  Inadequate warm-up before practice or play or failure to stretch before participation.  Injury to the tendon. PREVENTION   Warm up and stretch before practice or competition.  Allow time for adequate rest and recovery between practices and competition.  Keep up conditioning.  Keep up ankle and leg flexibility.  Improve or keep muscle strength and endurance.  Improve cardiovascular fitness.  Use proper technique.  Use proper equipment (shoes, skates).  To help prevent recurrence, taping, protective strapping, or an adhesive bandage may be recommended for several weeks after healing is complete. PROGNOSIS   Recovery may take weeks to several months to heal.  Longer recovery is expected if symptoms have been prolonged.  Recovery is usually quicker if the inflammation is due to a direct blow as compared with overuse or sudden strain. RELATED COMPLICATIONS   Healing time will be prolonged if the condition is not correctly treated. The injury must be given plenty of time to heal.  Symptoms can reoccur if  activity is resumed too soon.  Untreated, tendinitis may increase the risk of tendon rupture requiring additional time for recovery and possibly surgery. TREATMENT   The first treatment consists of rest anti-inflammatory medication, and ice to relieve the pain.  Stretching and strengthening exercises after resolution of pain will likely help reduce the risk of recurrence. Referral to a physical therapist or athletic trainer for further evaluation and treatment may be helpful.  A walking boot or cast may be recommended to rest the Achilles tendon. This can help break the cycle of inflammation and microtrauma.  Arch supports (orthotics) may be prescribed or recommended by your caregiver as an adjunct to therapy and rest.  Surgery to remove the inflamed tendon lining or degenerated tendon tissue is rarely necessary and has shown less than predictable results. MEDICATION   Nonsteroidal anti-inflammatory medications, such as aspirin and ibuprofen, may be used for pain and inflammation relief. Do not take within 7 days before surgery. Take these as directed by your caregiver. Contact your caregiver immediately if any bleeding, stomach upset, or signs of allergic reaction occur. Other minor pain relievers, such as acetaminophen, may also be used.  Pain relievers may be prescribed as necessary by your caregiver. Do not take prescription pain medication for longer than 4 to 7 days. Use only as directed and only as much as you need. HEAT AND COLD  Cold is used to relieve pain and reduce inflammation for acute and chronic Achilles tendinitis. Cold should be applied for 10 to 15 minutes every 2 to 3 hours for inflammation and pain and immediately after any activity that aggravates your symptoms. Use ice packs or an ice massage.  Heat may be used   before performing stretching and strengthening activities prescribed by your caregiver. Use a heat pack or a warm soak. SEEK MEDICAL CARE IF:  Symptoms get  worse or do not improve in 2 weeks despite treatment.  New, unexplained symptoms develop. Drugs used in treatment may produce side effects.   EXERCISES-- hold each stretch for 30 seconds and repeat 10 times.  Complete each stretch 3 times per day.   RANGE OF MOTION (ROM) AND STRETCHING EXERCISES - Achilles Tendinitis  These exercises may help you when beginning to rehabilitate your injury. Your symptoms may resolve with or without further involvement from your physician, physical therapist or athletic trainer. While completing these exercises, remember:   Restoring tissue flexibility helps normal motion to return to the joints. This allows healthier, less painful movement and activity.  An effective stretch should be held for at least 30 seconds.  A stretch should never be painful. You should only feel a gentle lengthening or release in the stretched tissue. STRETCH  Gastroc, Standing   Place hands on wall.  Extend right / left leg, keeping the front knee somewhat bent.  Slightly point your toes inward on your back foot.  Keeping your right / left heel on the floor and your knee straight, shift your weight toward the wall, not allowing your back to arch.  You should feel a gentle stretch in the right / left calf. Hold this position for __________ seconds. Repeat __________ times. Complete this stretch __________ times per day. STRETCH  Soleus, Standing   Place hands on wall.  Extend right / left leg, keeping the other knee somewhat bent.  Slightly point your toes inward on your back foot.  Keep your right / left heel on the floor, bend your back knee, and slightly shift your weight over the back leg so that you feel a gentle stretch deep in your back calf.  Hold this position for __________ seconds. Repeat __________ times. Complete this stretch __________ times per day. STRETCH  Gastrocsoleus, Standing  Note: This exercise can place a lot of stress on your foot and ankle.  Please complete this exercise only if specifically instructed by your caregiver.   Place the ball of your right / left foot on a step, keeping your other foot firmly on the same step.  Hold on to the wall or a rail for balance.  Slowly lift your other foot, allowing your body weight to press your heel down over the edge of the step.  You should feel a stretch in your right / left calf.  Hold this position for __________ seconds.  Repeat this exercise with a slight bend in your knee. Repeat __________ times. Complete this stretch __________ times per day.  STRENGTHENING EXERCISES - Achilles Tendinitis These exercises may help you when beginning to rehabilitate your injury. They may resolve your symptoms with or without further involvement from your physician, physical therapist or athletic trainer. While completing these exercises, remember:   Muscles can gain both the endurance and the strength needed for everyday activities through controlled exercises.  Complete these exercises as instructed by your physician, physical therapist or athletic trainer. Progress the resistance and repetitions only as guided.  You may experience muscle soreness or fatigue, but the pain or discomfort you are trying to eliminate should never worsen during these exercises. If this pain does worsen, stop and make certain you are following the directions exactly. If the pain is still present after adjustments, discontinue the exercise until you can discuss   the trouble with your clinician. STRENGTH - Plantar-flexors   Sit with your right / left leg extended. Holding onto both ends of a rubber exercise band/tubing, loop it around the ball of your foot. Keep a slight tension in the band.  Slowly push your toes away from you, pointing them downward.  Hold this position for __________ seconds. Return slowly, controlling the tension in the band/tubing. Repeat __________ times. Complete this exercise __________ times  per day.  STRENGTH - Plantar-flexors   Stand with your feet shoulder width apart. Steady yourself with a wall or table using as little support as needed.  Keeping your weight evenly spread over the width of your feet, rise up on your toes.*  Hold this position for __________ seconds. Repeat __________ times. Complete this exercise __________ times per day.  *If this is too easy, shift your weight toward your right / left leg until you feel challenged. Ultimately, you may be asked to do this exercise with your right / left foot only. STRENGTH  Plantar-flexors, Eccentric  Note: This exercise can place a lot of stress on your foot and ankle. Please complete this exercise only if specifically instructed by your caregiver.   Place the balls of your feet on a step. With your hands, use only enough support from a wall or rail to keep your balance.  Keep your knees straight and rise up on your toes.  Slowly shift your weight entirely to your right / left toes and pick up your opposite foot. Gently and with controlled movement, lower your weight through your right / left foot so that your heel drops below the level of the step. You will feel a slight stretch in the back of your calf at the end position.  Use the healthy leg to help rise up onto the balls of both feet, then lower weight only on the right / left leg again. Build up to 15 repetitions. Then progress to 3 consecutive sets of 15 repetitions.*  After completing the above exercise, complete the same exercise with a slight knee bend (about 30 degrees). Again, build up to 15 repetitions. Then progress to 3 consecutive sets of 15 repetitions.* Perform this exercise __________ times per day.  *When you easily complete 3 sets of 15, your physician, physical therapist or athletic trainer may advise you to add resistance by wearing a backpack filled with additional weight. STRENGTH - Plantar Flexors, Seated   Sit on a chair that allows your feet  to rest flat on the ground. If necessary, sit at the edge of the chair.  Keeping your toes firmly on the ground, lift your right / left heel as far as you can without increasing any discomfort in your ankle. Repeat __________ times. Complete this exercise __________ times a day. *If instructed by your physician, physical therapist or athletic trainer, you may add ____________________ of resistance by placing a weighted object on your right / left knee. Document Released: 02/20/2005 Document Revised: 10/14/2011 Document Reviewed: 11/03/2008 [employer] Patient Information 2014 [employer].    

## 2023-03-07 NOTE — Progress Notes (Signed)
Subjective:  Patient ID: Christine [name], female    DOB: November 17, 1975,  MRN: [id number]  Chief Complaint  Patient presents with   Foot Pain    "The right one has improved but the left one is a little worse.  The right one still hurts some too.  The Meloxicam has helped."    47 y.o. female presents for follow-up on bilateral Achilles tendinitis in the mid substance.  She says the right Achilles has improved some especially because she has been using the night splint primarily on the right side.  She says the left side is a little bit worse today.  She does report the meloxicam helping.  She also notes that she has not been very compliant with the treatment regimen including the stretching exercises that were previously recommended  Past Medical History:  Diagnosis Date   Allergy    Anemia    w/ blood transfusion;  Presumably from menorrhagia/menomenorrhagia; has had both blood and iron transfusions   Anxiety    Back pain    no current problems as of 01/28/23   Bipolar 1 disorder (HCC)    With depression   Chest pain    CHF (congestive heart failure) (HCC)    Depression    Diastolic heart failure (HCC)    Dyspnea    exertion   Essential hypertension    Fibromyalgia    History of blood transfusion 08/2017   [facility]   History of hiatal hernia    IBS (irritable bowel syndrome)    no current problems as of 01/28/23   Infertility, female    Lactose intolerance    Lower extremity edema    Migraines    Morbid obesity (HCC)    Osteoarthritis    Hands, ankles; HLA-B27 positive   PCOS (polycystic ovarian syndrome)    treated metformin   Pneumonia    x several   Pre-diabetes    5.7 hbga!c 05/02/22   Seasonal allergies    With recurrent allergic rhinitis   Sleep apnea    uses nightly   SOB (shortness of breath)    Uterine leiomyoma    Vitamin D deficiency     Allergies  Allergen Reactions   Penicillins Rash         ROS: Negative except as per HPI above  Objective:  General:  AAO x3, NAD  Dermatological: With inspection and palpation of the right and left lower extremities there are no open sores, no preulcerative lesions, no rash or signs of infection present. Nails are of normal length thickness and coloration.   Vascular:  Dorsalis Pedis artery and Posterior Tibial artery pedal pulses are 2/4 bilateral.  Capillary fill time < 3 sec to all digits.   Neruologic: Grossly intact via light touch bilateral. Protective threshold intact to all sites bilateral.   Musculoskeletal: Decreased tender palpation along the mid substance of the Achilles just proximal to its insertion bilateral foot.  No significant osseous prominence noted.  Pain is increased with ankle dorsiflexion range of motion.  Dorsiflexion range of motion is limited bilaterally positive Silfverskiold test bilateral  Gait: Unassisted, Nonantalgic.   No images are attached to the encounter.  Radiographs:  Date: 01/09/2023 XR both feet Weightbearing AP/Lateral/Oblique   Findings: No significant osseous prominence of the posterior aspect of the heel where the Achilles inserts however there is mild Haglund's deformity noted bilaterally. Assessment:   1. Achilles tendinitis of both lower extremities     Plan:  Patient was evaluated and treated  and all questions answered.  # Achilles tendinitis bilateral -Slightly improved Achilles tendinitis bilateral -Recommend stretching anti-inflammatory modalities as well as heel lifts and night splint -Dispensed night splint for the patient's left side at this visit as she says that using on the right side was helping a lot. -eRx for meloxicam 15 mg take once daily for the next 30 days -eRx for methylprednisolone 4 mg steroid taper pack take as directed for 6 days as needed for flareup of pain -Dispensed stretching instructions with the patient.  Recommend heel lifts for shoes and dispensed this with the patient as well -Patient states she will try to be more  compliant with the above recommendations.  -If no improvement would consider physical therapy referral -Patient will follow-up as needed if the anti-inflammatories stretching and night splints are not effective in reducing her Achilles pain          Corinna Gab, DPM [facility] / [facility]

## 2023-03-13 ENCOUNTER — Ambulatory Visit (HOSPITAL_BASED_OUTPATIENT_CLINIC_OR_DEPARTMENT_OTHER): Payer: Medicare Other | Admitting: Certified Registered Nurse Anesthetist

## 2023-03-13 ENCOUNTER — Ambulatory Visit (HOSPITAL_COMMUNITY): Payer: Medicare Other | Admitting: Certified Registered Nurse Anesthetist

## 2023-03-13 ENCOUNTER — Other Ambulatory Visit: Payer: Self-pay

## 2023-03-13 ENCOUNTER — Encounter (HOSPITAL_COMMUNITY)
Admission: RE | Disposition: A | Discharge status: Home / Self Care | Payer: Self-pay | Source: Home / Self Care | Attending: Gastroenterology

## 2023-03-13 ENCOUNTER — Encounter (HOSPITAL_COMMUNITY): Payer: Self-pay | Admitting: Gastroenterology

## 2023-03-13 ENCOUNTER — Ambulatory Visit (HOSPITAL_COMMUNITY)
Admission: RE | Admit: 2023-03-13 | Discharge: 2023-03-13 | Disposition: A | Discharge status: Home / Self Care | Payer: Medicare Other | Attending: Gastroenterology | Admitting: Gastroenterology

## 2023-03-13 DIAGNOSIS — F319 Bipolar disorder, unspecified: Secondary | ICD-10-CM | POA: Diagnosis not present

## 2023-03-13 DIAGNOSIS — F419 Anxiety disorder, unspecified: Secondary | ICD-10-CM | POA: Insufficient documentation

## 2023-03-13 DIAGNOSIS — G4730 Sleep apnea, unspecified: Secondary | ICD-10-CM | POA: Diagnosis not present

## 2023-03-13 DIAGNOSIS — K640 First degree hemorrhoids: Secondary | ICD-10-CM | POA: Diagnosis not present

## 2023-03-13 DIAGNOSIS — Z6841 Body Mass Index (BMI) 40.0 and over, adult: Secondary | ICD-10-CM | POA: Diagnosis not present

## 2023-03-13 DIAGNOSIS — K635 Polyp of colon: Secondary | ICD-10-CM

## 2023-03-13 DIAGNOSIS — D649 Anemia, unspecified: Secondary | ICD-10-CM | POA: Diagnosis not present

## 2023-03-13 DIAGNOSIS — K2970 Gastritis, unspecified, without bleeding: Secondary | ICD-10-CM

## 2023-03-13 DIAGNOSIS — D125 Benign neoplasm of sigmoid colon: Secondary | ICD-10-CM | POA: Diagnosis not present

## 2023-03-13 DIAGNOSIS — Z7984 Long term (current) use of oral hypoglycemic drugs: Secondary | ICD-10-CM | POA: Insufficient documentation

## 2023-03-13 DIAGNOSIS — I5032 Chronic diastolic (congestive) heart failure: Secondary | ICD-10-CM | POA: Diagnosis not present

## 2023-03-13 DIAGNOSIS — E538 Deficiency of other specified B group vitamins: Secondary | ICD-10-CM | POA: Insufficient documentation

## 2023-03-13 DIAGNOSIS — I5031 Acute diastolic (congestive) heart failure: Secondary | ICD-10-CM | POA: Diagnosis not present

## 2023-03-13 DIAGNOSIS — E559 Vitamin D deficiency, unspecified: Secondary | ICD-10-CM | POA: Insufficient documentation

## 2023-03-13 DIAGNOSIS — M1990 Unspecified osteoarthritis, unspecified site: Secondary | ICD-10-CM | POA: Diagnosis not present

## 2023-03-13 DIAGNOSIS — M797 Fibromyalgia: Secondary | ICD-10-CM | POA: Diagnosis not present

## 2023-03-13 DIAGNOSIS — I110 Hypertensive heart disease with heart failure: Secondary | ICD-10-CM | POA: Diagnosis not present

## 2023-03-13 DIAGNOSIS — E119 Type 2 diabetes mellitus without complications: Secondary | ICD-10-CM | POA: Diagnosis not present

## 2023-03-13 DIAGNOSIS — D509 Iron deficiency anemia, unspecified: Secondary | ICD-10-CM | POA: Diagnosis not present

## 2023-03-13 DIAGNOSIS — Z800 Family history of malignant neoplasm of digestive organs: Secondary | ICD-10-CM | POA: Insufficient documentation

## 2023-03-13 DIAGNOSIS — R194 Change in bowel habit: Secondary | ICD-10-CM | POA: Diagnosis not present

## 2023-03-13 HISTORY — PX: COLONOSCOPY WITH PROPOFOL: SHX5780

## 2023-03-13 HISTORY — PX: ESOPHAGOGASTRODUODENOSCOPY (EGD) WITH PROPOFOL: SHX5813

## 2023-03-13 HISTORY — PX: BIOPSY: SHX5522

## 2023-03-13 HISTORY — PX: POLYPECTOMY: SHX5525

## 2023-03-13 SURGERY — COLONOSCOPY WITH PROPOFOL
Anesthesia: Monitor Anesthesia Care

## 2023-03-13 MED ORDER — AMISULPRIDE (ANTIEMETIC) 5 MG/2ML IV SOLN
10.0000 mg | Freq: Once | INTRAVENOUS | Status: AC | PRN
  Administered 2023-03-13: 5 mg via INTRAVENOUS

## 2023-03-13 MED ORDER — LACTATED RINGERS IV SOLN: INTRAVENOUS | Status: DC

## 2023-03-13 MED ORDER — OMEPRAZOLE MAGNESIUM 20 MG PO TBEC: 20.0000 mg | DELAYED_RELEASE_TABLET | Freq: Every day | ORAL | 1 refills | Status: DC

## 2023-03-13 MED ORDER — AMISULPRIDE (ANTIEMETIC) 5 MG/2ML IV SOLN
INTRAVENOUS | Status: AC
  Filled 2023-03-13: qty 2

## 2023-03-13 MED ORDER — PROPOFOL 500 MG/50ML IV EMUL
INTRAVENOUS | Status: DC | PRN
  Administered 2023-03-13: 180 ug/kg/min via INTRAVENOUS

## 2023-03-13 MED ORDER — SODIUM CHLORIDE 0.9 % IV SOLN: INTRAVENOUS | Status: DC

## 2023-03-13 MED ORDER — PHENYLEPHRINE HCL (PRESSORS) 10 MG/ML IV SOLN
INTRAVENOUS | Status: DC | PRN
  Administered 2023-03-13 (×2): 80 ug via INTRAVENOUS

## 2023-03-13 MED ORDER — LIDOCAINE 2% (20 MG/ML) 5 ML SYRINGE
INTRAMUSCULAR | Status: DC | PRN
  Administered 2023-03-13: 50 mg via INTRAVENOUS

## 2023-03-13 MED ORDER — PROPOFOL 10 MG/ML IV BOLUS
INTRAVENOUS | Status: DC | PRN
Start: 2023-03-13 — End: 2023-03-13
  Administered 2023-03-13: 100 mg via INTRAVENOUS

## 2023-03-13 SURGICAL SUPPLY — 25 items

## 2023-03-13 NOTE — Anesthesia Procedure Notes (Signed)
Procedure Name: MAC Date/Time: 03/13/2023 9:24 AM  Performed by: Cy Blamer, CRNAPre-anesthesia Checklist: Patient identified, Emergency Drugs available, Suction available, Patient being monitored and Timeout performed Patient Re-evaluated:Patient Re-evaluated prior to induction Oxygen Delivery Method: Nasal cannula Placement Confirmation: positive ETCO2 Dental Injury: Teeth and Oropharynx as per pre-operative assessment

## 2023-03-13 NOTE — Op Note (Signed)
[facility] Patient Name: Christine [name] Procedure Date : 03/13/2023 MRN: [id number] Attending MD: Lynann Bologna , MD, [id number] Date of Birth: 26-May-1976 CSN: [id number] Age: 47 Admit Type: Outpatient Procedure:                Colonoscopy Indications:              Iron deficiency anemia. H/O menorrhagia d/t                            fibroids. Providers:                Lynann Bologna, MD, Norman Clay, RN, Marja Kays,                            Technician Referring MD:              Medicines:                Monitored Anesthesia Care Complications:            No immediate complications. Estimated Blood Loss:     Estimated blood loss: none. Procedure:                Pre-Anesthesia Assessment:                           - Prior to the procedure, a History and Physical                            was performed, and patient medications and                            allergies were reviewed. The patient's tolerance of                            previous anesthesia was also reviewed. The risks                            and benefits of the procedure and the sedation                            options and risks were discussed with the patient.                            All questions were answered, and informed consent                            was obtained. Prior Anticoagulants: The patient has                            taken no anticoagulant or antiplatelet agents. ASA                            Grade Assessment: III - A patient with severe                            systemic disease. After  reviewing the risks and                            benefits, the patient was deemed in satisfactory                            condition to undergo the procedure.                           After obtaining informed consent, the colonoscope                            was passed under direct vision. Throughout the                            procedure, the patient's blood pressure, pulse,  and                            oxygen saturations were monitored continuously. The                            CF-HQ190L ([id number]) Olympus colonoscope was                            introduced through the anus and advanced to the 2                            cm into the ileum. The colonoscopy was performed                            without difficulty. The patient tolerated the                            procedure well. The quality of the bowel                            preparation was good. The terminal ileum, ileocecal                            valve, appendiceal orifice, and rectum were                            photographed. Scope In: 9:37:52 AM Scope Out: 9:57:27 AM Scope Withdrawal Time: 0 hours 12 minutes 59 seconds  Total Procedure Duration: 0 hours 19 minutes 35 seconds  Findings:      Three sessile polyps were found in the proximal sigmoid colon, proximal       descending colon and mid descending colon. The polyps were 6 to 8 mm in       size. These polyps were removed with a cold snare. Resection and       retrieval were complete.      Non-bleeding internal hemorrhoids were found during retroflexion. The       hemorrhoids were small and Grade I (internal hemorrhoids that do not       prolapse).  The terminal ileum appeared normal.      The exam was otherwise without abnormality on direct and retroflexion       views. Impression:               - Three 6 to 8 mm polyps in the proximal sigmoid                            colon, in the proximal descending colon and in the                            mid descending colon, removed with a cold snare.                            Resected and retrieved.                           - Non-bleeding internal hemorrhoids.                           - The examined portion of the ileum was normal.                           - The examination was otherwise normal on direct                            and retroflexion views. Recommendation:            - Patient has a contact number available for                            emergencies. The signs and symptoms of potential                            delayed complications were discussed with the                            patient. Return to normal activities tomorrow.                            Written discharge instructions were provided to the                            patient.                           - Resume previous diet.                           - Continue present medications.                           - Await pathology results.                           - Repeat colonoscopy for surveillance based on  pathology results.                           - The findings and recommendations were discussed                            with the patient's family. Procedure Code(s):        --- Professional ---                           [phone], Colonoscopy, flexible; with removal of                            tumor(s), polyp(s), or other lesion(s) by snare                            technique Diagnosis Code(s):        --- Professional ---                           D12.5, Benign neoplasm of sigmoid colon                           D12.4, Benign neoplasm of descending colon                           K64.0, First degree hemorrhoids                           D50.9, Iron deficiency anemia, unspecified CPT copyright 2022 [employer]. All rights reserved. The codes documented in this report are preliminary and upon coder review may  be revised to meet current compliance requirements. Lynann Bologna, MD 03/13/2023 10:05:03 AM This report has been signed electronically. Number of Addenda: 0

## 2023-03-13 NOTE — H&P (Signed)
10/08/2022 Christine [name] [id number] [birth year]     HISTORY OF PRESENT ILLNESS: This is a 47 year old female who is new to our office.  She has been referred here by hematology for evaluation of iron deficiency anemia.  She says that she has had longstanding iron deficiency anemia that has been attributed to menorrhagia.  This has been going on for years and she has received blood transfusions in the past.  She does not tolerate oral iron very well.  She has been receiving IV iron infusions.  She is now 61 so they wanted her to proceed with GI evaluation.  She had a colonoscopy over 10 years ago with Dr. Elnoria Howard she believes probably in 2008 or 2011.  She has never had an upper endoscopy.  She denies any black or bloody stools.  She reports having about 2-3 bowel movements a day, usually 1 of those bowel movements is loose, and she kind of attributes that to the metformin.  Her B12 levels are actually borderline low at 191 on the last check.  She said that her vitamin D levels were low in the past also and she had taken 50,000 units for a while.  She has appointment with hematology again in April at which time they are planning to do more labs then.  Does not use any NSAIDs.   Family history of colon cancer in her father diagnosed initially in his mid 64s.         Past Medical History:  Diagnosis Date   Anemia      Presumably from menorrhagia/menomenorrhagia; has had both blood and iron transfusions   Anxiety     Back pain     Bipolar 1 disorder (HCC)      With depression   Chest pain     Depression     Diastolic heart failure (HCC)     Essential hypertension     Fibromyalgia     Headache     History of blood transfusion 08/2017    [facility]   History of hiatal hernia     IBS (irritable bowel syndrome)     Infertility, female     Infertility, female     Lactose intolerance     Lower extremity edema     Migraines     Morbid obesity (HCC)     Osteoarthritis      Hands, ankles; HLA-B27  positive   PCOS (polycystic ovarian syndrome)     Prediabetes     Seasonal allergies      With recurrent allergic rhinitis   Sleep apnea     SOB (shortness of breath)     Uterine leiomyoma     Vitamin D deficiency               Past Surgical History:  Procedure Laterality Date   CARDIAC CT ANGIOGRAM   03/2019     Ca Score 0. Normal Coronary origin R dominant. No evidence of CAD. ? Liver nodules - poorly visualized (consider MRI w & w/o Gad contrast)   CERVICAL POLYPECTOMY   01/15/2018    Procedure: CERVICAL POLYPECTOMY;  Surgeon: Richardean Chimera, MD;  Location: [facility];  Service: Gynecology;;   COLONOSCOPY   2008    Normal   DIAGNOSTIC LAPAROSCOPY   08/1999    dermoid cyst, RSO   DILATION AND CURETTAGE OF UTERUS   05/2004    MAB   DILATION AND CURETTAGE OF UTERUS   04/2015  HYSTEROSCOPY WITH D & C   07/22/2011    Procedure: DILATATION AND CURETTAGE /HYSTEROSCOPY;  Surgeon: Jeani Hawking, MD;  Location: [facility];  Service: Gynecology;;   HYSTEROSCOPY WITH D & C N/A 01/15/2018    Procedure: DILATATION AND CURETTAGE Melton Krebs WITH MYOSURE;  Surgeon: Richardean Chimera, MD;  Location: [facility];  Service: Gynecology;  Laterality: N/A;   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  Suggestion of high cardiac output (possibly related to anemia thyrotoxicosis, Pregnancy, sepsis etc.) -- was in setting of symptomatic anemia - Hgb 5.7         reports that she has never smoked. She has never used smokeless tobacco. She reports current alcohol use. She reports that she does not use drugs. family history includes Anxiety disorder in her mother; Cancer (age of onset: 78) in her brother; Colon cancer in her father; Depression in her mother; Diabetes in her father; Heart disease in her father; High blood pressure in her brother, father, and mother; Hyperlipidemia in her father and mother; Hypertension in her father and  mother; Kidney disease in her father; Obesity in her father; Pancreatic cancer in her mother; Sleep apnea in her father; Stroke in her father. Allergies       Allergies  Allergen Reactions   Penicillins Rash and Other (See Comments)      Has patient had a PCN reaction causing immediate rash, facial/tongue/throat swelling, SOB or lightheadedness with hypotension: Yes Has patient had a PCN reaction causing severe rash involving mucus membranes or skin necrosis: Unknown Has patient had a PCN reaction that required hospitalization: No Has patient had a PCN reaction occurring within the last 10 years: No If all of the above answers are "NO", then may proceed with Cephalosporin use.                Outpatient Encounter Medications as of 10/08/2022  Medication Sig   acetaminophen (TYLENOL) 325 MG tablet Take 650 mg by mouth every 6 (six) hours as needed for mild pain or headache.   amLODipine (NORVASC) 5 MG tablet Take 1 tablet (5 mg total) by mouth daily.   CAPLYTA 42 MG capsule Take 42 mg by mouth daily.   chlorthalidone (HYGROTON) 25 MG tablet TAKE 1/2 TABLET(12.5 MG) BY MOUTH DAILY   Cholecalciferol (VITAMIN D-3) 125 MCG (5000 UT) TABS Take once daily   clonazePAM (KLONOPIN) 0.5 MG tablet Take 1 tablet (0.5 mg total) by mouth daily as needed for anxiety.   Cyanocobalamin (VITAMIN B-12) 5000 MCG TBDP Take once daily   fluticasone (FLONASE) 50 MCG/ACT nasal spray 1 spray in each nostril   gabapentin (NEURONTIN) 100 MG capsule Take 100 mg by mouth 3 (three) times daily.   lamoTRIgine (LAMICTAL) 150 MG tablet Take 1 tablet (150 mg total) by mouth 2 (two) times daily. For mood   lithium carbonate (LITHOBID) 300 MG ER tablet Take 2 tablets (600 mg total) by mouth 2 (two) times daily.   metFORMIN (GLUCOPHAGE) 500 MG tablet Take 1 tablet (500 mg total) by mouth 2 (two) times daily with a meal.   metoprolol succinate (TOPROL-XL) 100 MG 24 hr tablet TAKE 1 TABLET BY MOUTH DAILY WITH OR IMMEDIATELY  FOLLOWING A MEAL   Na Sulfate-K Sulfate-Mg Sulf 17.5-3.13-1.6 GM/177ML SOLN Take 1 kit by mouth once for 1 dose.   [DISCONTINUED] Vitamin D, Ergocalciferol, (DRISDOL) 1.25 MG (50000  UNIT) CAPS capsule Take 1 capsule (50,000 Units total) by mouth every 7 (seven) days.      No facility-administered encounter medications on file as of 10/08/2022.        REVIEW OF SYSTEMS  : All other systems reviewed and negative except where noted in the History of Present Illness.     PHYSICAL EXAM: BP 138/80   Pulse 95   Ht 5\' 4"  (1.626 m)   Wt (!) 330 lb (149.7 kg)   LMP 09/13/2022 (Exact Date)   BMI 56.64 kg/m  General: Well developed AA female in no acute distress Head: Normocephalic and atraumatic Eyes:  Sclerae anicteric, conjunctiva pink. Ears: Normal auditory acuity Lungs: Clear throughout to auscultation; no W/R/R. Heart: Regular rate and rhythm; no M/R/G. Abdomen: Soft, non-distended.  BS present.  Non-tender.  Umbilical hernia noted and also seems to have a ventral hernia as well. Rectal:  Will be done at the time of colonoscopy. Musculoskeletal: Symmetrical with no gross deformities  Skin: No lesions on visible extremities Extremities: No edema  Neurological: Alert oriented x 4, grossly non-focal Psychological:  Alert and cooperative. Normal mood and affect   ASSESSMENT AND PLAN: *IDA: Receiving IV iron infusions and following with hematology who has referred her here.  Last colonoscopy over 10 years ago.  Never had an endoscopy.  Will plan for EGD and colonoscopy with Dr. Chales Abrahams at [facility] due to BMI greater than 50.  The risks, benefits, and alternatives to EGD and colonoscopy were discussed with the patient and she consents to proceed.  *Vitamin B12 deficiency: Will start by 5000 mcg supplement OTC for now. *Vitamin D deficiency: Will start 5000 units OTC for now.  She will discuss both of these things with hematology when she sees him again in April. *Family history of  colon cancer in her father:  Diagnosed in his mid-60s. *Loose stools:  Dicussed that she could try Benefiber 2 tsp in 8 ounces of liquid daily to try to helping with bulking.      Attending physician's note   I have taken history, reviewed the chart and examined the patient. I performed a substantive portion of this encounter, including complete performance of at least one of the key components, in conjunction with the APP. I agree with the Advanced Practitioner's note, impression and recommendations.   for EGD/colonoscopy today for IDA, [facility] d/t BMI>50   I discussed EGD/Colonoscopy- the indications, risks, alternatives and potential complications including, but not limited to bleeding, infection, reaction to meds, damage to internal organs, cardiac and/or pulmonary problems, and perforation requiring surgery. The possibility that significant findings could be missed was explained. All ? were answered. Pt consents to proceed.    Edman Circle, MD [facility] [phone]

## 2023-03-13 NOTE — Anesthesia Postprocedure Evaluation (Signed)
Anesthesia Post Note  Patient: Christine [name]  Procedure(s) Performed: COLONOSCOPY WITH PROPOFOL ESOPHAGOGASTRODUODENOSCOPY (EGD) WITH PROPOFOL BIOPSY POLYPECTOMY     Patient location during evaluation: PACU Anesthesia Type: MAC Level of consciousness: awake and alert Pain management: pain level controlled Vital Signs Assessment: post-procedure vital signs reviewed and stable Respiratory status: spontaneous breathing Cardiovascular status: stable Anesthetic complications: no   No notable events documented.  Last Vitals:  Vitals:   03/13/23 1015 03/13/23 1030  BP: 123/76 126/71  Pulse: 80 77  Resp: 17 (!) 22  Temp:  36.8 C  SpO2: 98% 98%    Last Pain:  Vitals:   03/13/23 1002  TempSrc:   PainSc: 3                  [name] [employer]

## 2023-03-13 NOTE — Op Note (Signed)
[facility] Patient Name: Christine [name] Procedure Date : 03/13/2023 MRN: [id number] Attending MD: Lynann Bologna , MD, [id number] Date of Birth: 1976-06-24 CSN: [id number] Age: 47 Admit Type: Outpatient Procedure:                Upper GI endoscopy Indications:              Iron deficiency anemia Providers:                Lynann Bologna, MD, Norman Clay, RN, Marja Kays,                            Technician Referring MD:              Medicines:                Monitored Anesthesia Care Complications:            No immediate complications. Estimated Blood Loss:     Estimated blood loss: none. Procedure:                Pre-Anesthesia Assessment:                           - Prior to the procedure, a History and Physical                            was performed, and patient medications and                            allergies were reviewed. The patient's tolerance of                            previous anesthesia was also reviewed. The risks                            and benefits of the procedure and the sedation                            options and risks were discussed with the patient.                            All questions were answered, and informed consent                            was obtained. Prior Anticoagulants: The patient has                            taken no anticoagulant or antiplatelet agents. ASA                            Grade Assessment: III - A patient with severe                            systemic disease. After reviewing the risks and  benefits, the patient was deemed in satisfactory                            condition to undergo the procedure.                           After obtaining informed consent, the endoscope was                            passed under direct vision. Throughout the                            procedure, the patient's blood pressure, pulse, and                            oxygen saturations were  monitored continuously. The                            [id number]-[id number] ([id number]) Olympus endoscope was introduced                            through the mouth, and advanced to the second part                            of duodenum. The upper GI endoscopy was                            accomplished without difficulty. The patient                            tolerated the procedure well. Scope In: Scope Out: Findings:      The examined esophagus was normal.      The Z-line was regular and was found 40 cm from the incisors.      Localized mild inflammation characterized by erythema was found in the       gastric antrum. Biopsies were taken with a cold forceps for histology.      The examined duodenum was normal. Biopsies for histology were taken with       a cold forceps for evaluation of celiac disease. Impression:               - Mild gastritis. Biopsied.                           - Otherwise normal EGD. Recommendation:           - Patient has a contact number available for                            emergencies. The signs and symptoms of potential                            delayed complications were discussed with the                            patient. Return to normal activities  tomorrow.                            Written discharge instructions were provided to the                            patient.                           - Resume previous diet.                           - Continue present medications.                           - Await pathology results.                           - Use Prilosec (omeprazole) 20 mg PO daily.                           - The findings and recommendations were discussed                            with the patient's family. Procedure Code(s):        --- Professional ---                           [phone], Esophagogastroduodenoscopy, flexible,                            transoral; with biopsy, single or multiple Diagnosis Code(s):        --- Professional ---                            K29.70, Gastritis, unspecified, without bleeding                           D50.9, Iron deficiency anemia, unspecified CPT copyright 2022 American Medical Association. All rights reserved. The codes documented in this report are preliminary and upon coder review may  be revised to meet current compliance requirements. Lynann Bologna, MD 03/13/2023 10:01:27 AM This report has been signed electronically. Number of Addenda: 0

## 2023-03-13 NOTE — Anesthesia Preprocedure Evaluation (Addendum)
Anesthesia Evaluation  Patient identified by MRN, date of birth, ID band Patient awake    Reviewed: Allergy & Precautions, H&P , NPO status , Patient's Chart, lab work & pertinent test results  Airway Mallampati: II  TM Distance: >3 FB Neck ROM: Full    Dental  (+) Dental Advisory Given, Teeth Intact   Pulmonary shortness of breath, sleep apnea and Continuous Positive Airway Pressure Ventilation , pneumonia   Pulmonary exam normal breath sounds clear to auscultation       Cardiovascular hypertension, +CHF  + Valvular Problems/Murmurs  Rhythm:Regular Rate:Normal + Systolic murmurs Echo 2021 1. Left ventricular ejection fraction, by visual estimation, is 60 to  65%. The left ventricle has normal function. There is no left ventricular  hypertrophy.   2. The left ventricle has no regional wall motion abnormalities.   3. Global right ventricle has normal systolic function.The right  ventricular size is normal. No increase in right ventricular wall  thickness.   4. Left atrial size was normal.   5. Right atrial size was normal.   6. The mitral valve is normal in structure. No evidence of mitral valve  regurgitation.   7. The tricuspid valve is normal in structure.   8. The aortic valve is normal in structure. Aortic valve regurgitation is  not visualized.   9. The pulmonic valve was normal in structure. Pulmonic valve  regurgitation is not visualized.  10. The atrial septum is grossly normal.     Neuro/Psych  Headaches PSYCHIATRIC DISORDERS Anxiety Depression Bipolar Disorder    Neuromuscular disease    GI/Hepatic Neg liver ROS, hiatal hernia,,,  Endo/Other  diabetes (prediabetic)  Morbid obesityBMI 56  Renal/GU negative Renal ROS     Musculoskeletal  (+) Arthritis , Osteoarthritis,  Fibromyalgia -  Abdominal  (+) + obese  Peds  Hematology  (+) Blood dyscrasia, anemia Hb 11.6   Anesthesia Other Findings    Reproductive/Obstetrics negative OB ROS                             Anesthesia Physical Anesthesia Plan  ASA: 3  Anesthesia Plan: MAC   Post-op Pain Management: Minimal or no pain anticipated   Induction: Intravenous  PONV Risk Score and Plan: 2 and Treatment may vary due to age or medical condition, Propofol infusion and TIVA  Airway Management Planned: Natural Airway  Additional Equipment: None  Intra-op Plan:   Post-operative Plan:   Informed Consent: I have reviewed the patients History and Physical, chart, labs and discussed the procedure including the risks, benefits and alternatives for the proposed anesthesia with the patient or authorized representative who has indicated his/her understanding and acceptance.     Dental advisory given  Plan Discussed with: CRNA  Anesthesia Plan Comments: (Pt requests no urine pregnancy test. )        Anesthesia Quick Evaluation

## 2023-03-13 NOTE — Transfer of Care (Signed)
Immediate Anesthesia Transfer of Care Note  Patient: Jolisha Mandrell  Procedure(s) Performed: COLONOSCOPY WITH PROPOFOL ESOPHAGOGASTRODUODENOSCOPY (EGD) WITH PROPOFOL BIOPSY POLYPECTOMY  Patient Location: PACU  Anesthesia Type:MAC  Level of Consciousness: awake, alert , and oriented  Airway & Oxygen Therapy: Patient Spontanous Breathing  Post-op Assessment: Report given to RN, Post -op Vital signs reviewed and stable, Patient moving all extremities X 4, and Patient able to stick tongue midline  Post vital signs: Reviewed and stable  Last Vitals:  Vitals Value Taken Time  BP 120/63   Temp 98.3   Pulse 83   Resp 25   SpO2 95     Last Pain:  Vitals:   03/13/23 0900  TempSrc: Temporal  PainSc: 0-No pain         Complications: No notable events documented.

## 2023-03-14 ENCOUNTER — Encounter: Payer: Self-pay | Admitting: Gastroenterology

## 2023-03-14 DIAGNOSIS — F319 Bipolar disorder, unspecified: Secondary | ICD-10-CM | POA: Diagnosis not present

## 2023-03-14 NOTE — Progress Notes (Signed)
Letter sent to patient.

## 2023-03-17 ENCOUNTER — Encounter (HOSPITAL_COMMUNITY): Payer: Self-pay | Admitting: Gastroenterology

## 2023-03-18 DIAGNOSIS — F411 Generalized anxiety disorder: Secondary | ICD-10-CM | POA: Diagnosis not present

## 2023-03-18 DIAGNOSIS — F3132 Bipolar disorder, current episode depressed, moderate: Secondary | ICD-10-CM | POA: Diagnosis not present

## 2023-03-18 DIAGNOSIS — G4700 Insomnia, unspecified: Secondary | ICD-10-CM | POA: Diagnosis not present

## 2023-03-19 ENCOUNTER — Other Ambulatory Visit: Payer: Self-pay | Admitting: Cardiology

## 2023-03-21 DIAGNOSIS — F319 Bipolar disorder, unspecified: Secondary | ICD-10-CM | POA: Diagnosis not present

## 2023-03-25 DIAGNOSIS — Z6841 Body Mass Index (BMI) 40.0 and over, adult: Secondary | ICD-10-CM | POA: Diagnosis not present

## 2023-03-25 DIAGNOSIS — G4733 Obstructive sleep apnea (adult) (pediatric): Secondary | ICD-10-CM | POA: Diagnosis not present

## 2023-03-25 DIAGNOSIS — F319 Bipolar disorder, unspecified: Secondary | ICD-10-CM | POA: Diagnosis not present

## 2023-03-28 DIAGNOSIS — F319 Bipolar disorder, unspecified: Secondary | ICD-10-CM | POA: Diagnosis not present

## 2023-04-03 DIAGNOSIS — F3132 Bipolar disorder, current episode depressed, moderate: Secondary | ICD-10-CM | POA: Diagnosis not present

## 2023-04-03 DIAGNOSIS — F411 Generalized anxiety disorder: Secondary | ICD-10-CM | POA: Diagnosis not present

## 2023-04-09 DIAGNOSIS — F319 Bipolar disorder, unspecified: Secondary | ICD-10-CM | POA: Diagnosis not present

## 2023-04-10 ENCOUNTER — Inpatient Hospital Stay: Payer: Medicare Other

## 2023-04-10 ENCOUNTER — Telehealth: Payer: Self-pay | Admitting: Nurse Practitioner

## 2023-04-16 DIAGNOSIS — F319 Bipolar disorder, unspecified: Secondary | ICD-10-CM | POA: Diagnosis not present

## 2023-04-18 ENCOUNTER — Inpatient Hospital Stay: Payer: Medicare Other | Attending: Nurse Practitioner

## 2023-04-22 DIAGNOSIS — F319 Bipolar disorder, unspecified: Secondary | ICD-10-CM | POA: Diagnosis not present

## 2023-04-29 DIAGNOSIS — F319 Bipolar disorder, unspecified: Secondary | ICD-10-CM | POA: Diagnosis not present

## 2023-04-30 ENCOUNTER — Encounter: Payer: Self-pay | Admitting: Hematology

## 2023-05-01 DIAGNOSIS — F319 Bipolar disorder, unspecified: Secondary | ICD-10-CM | POA: Diagnosis not present

## 2023-05-06 DIAGNOSIS — F319 Bipolar disorder, unspecified: Secondary | ICD-10-CM | POA: Diagnosis not present

## 2023-05-07 DIAGNOSIS — F3132 Bipolar disorder, current episode depressed, moderate: Secondary | ICD-10-CM | POA: Diagnosis not present

## 2023-05-07 DIAGNOSIS — F411 Generalized anxiety disorder: Secondary | ICD-10-CM | POA: Diagnosis not present

## 2023-05-13 DIAGNOSIS — F319 Bipolar disorder, unspecified: Secondary | ICD-10-CM | POA: Diagnosis not present

## 2023-05-20 DIAGNOSIS — F319 Bipolar disorder, unspecified: Secondary | ICD-10-CM | POA: Diagnosis not present

## 2023-05-27 DIAGNOSIS — F411 Generalized anxiety disorder: Secondary | ICD-10-CM | POA: Diagnosis not present

## 2023-05-27 DIAGNOSIS — F3132 Bipolar disorder, current episode depressed, moderate: Secondary | ICD-10-CM | POA: Diagnosis not present

## 2023-05-27 DIAGNOSIS — F319 Bipolar disorder, unspecified: Secondary | ICD-10-CM | POA: Diagnosis not present

## 2023-05-29 DIAGNOSIS — F319 Bipolar disorder, unspecified: Secondary | ICD-10-CM | POA: Diagnosis not present

## 2023-06-03 DIAGNOSIS — F319 Bipolar disorder, unspecified: Secondary | ICD-10-CM | POA: Diagnosis not present

## 2023-06-08 NOTE — Progress Notes (Deleted)
Patient Care Team: Carilyn Goodpasture, NP as PCP - General (Family Medicine) Marykay Lex, MD as PCP - Cardiology (Cardiology)   CHIEF COMPLAINT: Follow up IDA  CURRENT THERAPY:  IV iron as needed, previously Feraheme now Venofer, last given 06/01/2021 and 06/09/2021; she does not tolerate oral iron   INTERVAL HISTORY Ms. Parco returns for follow up as scheduled. Last seen by me ***. Underwent EGD/colonoscopy 03/13/23 by Dr. Chales Abrahams which showed mild gastritis, 3 polyps in the sigmoid colon, and non-bleeding hemorrhoids. Random biopsy and polyps were benign.   ROS   Past Medical History:  Diagnosis Date   Allergy    Anemia    w/ blood transfusion;  Presumably from menorrhagia/menomenorrhagia; has had both blood and iron transfusions   Anxiety    Back pain    no current problems as of 01/28/23   Bipolar 1 disorder (HCC)    With depression   Chest pain    CHF (congestive heart failure) (HCC)    Depression    Diastolic heart failure (HCC)    Dyspnea    exertion   Essential hypertension    Fibromyalgia    History of blood transfusion 08/2017   [facility]   History of hiatal hernia    IBS (irritable bowel syndrome)    no current problems as of 01/28/23   Infertility, female    Lactose intolerance    Lower extremity edema    Migraines    Morbid obesity (HCC)    Osteoarthritis    Hands, ankles; HLA-B27 positive   PCOS (polycystic ovarian syndrome)    treated metformin   Pneumonia    x several   Pre-diabetes    5.7 hbga!c 05/02/22   Seasonal allergies    With recurrent allergic rhinitis   Sleep apnea    uses nightly   SOB (shortness of breath)    Uterine leiomyoma    Vitamin D deficiency      Past Surgical History:  Procedure Laterality Date   BIOPSY  03/13/2023   Procedure: BIOPSY;  Surgeon: Lynann Bologna, MD;  Location: [facility] ENDOSCOPY;  Service: Gastroenterology;;   CARDIAC [facility]  03/2019    Ca Score 0. Normal Coronary origin R dominant. No evidence of CAD. ? Liver  nodules - poorly visualized (consider MRI w & w/o Gad contrast)   CERVICAL POLYPECTOMY  01/15/2018   Procedure: CERVICAL POLYPECTOMY;  Surgeon: Richardean Chimera, MD;  Location: [facility] ORS;  Service: Gynecology;;   COLONOSCOPY  2008   Normal   COLONOSCOPY WITH PROPOFOL N/A 03/13/2023   Procedure: COLONOSCOPY WITH PROPOFOL;  Surgeon: Lynann Bologna, MD;  Location: [facility] ENDOSCOPY;  Service: Gastroenterology;  Laterality: N/A;   DIAGNOSTIC LAPAROSCOPY  08/1999   dermoid cyst, RSO   DILATION AND CURETTAGE OF UTERUS  05/2004   MAB   DILATION AND CURETTAGE OF UTERUS  04/2015   ESOPHAGOGASTRODUODENOSCOPY (EGD) WITH PROPOFOL N/A 03/13/2023   Procedure: ESOPHAGOGASTRODUODENOSCOPY (EGD) WITH PROPOFOL;  Surgeon: Lynann Bologna, MD;  Location: [facility] ENDOSCOPY;  Service: Gastroenterology;  Laterality: N/A;   HYSTEROSCOPY WITH D & C  07/22/2011   Procedure: DILATATION AND CURETTAGE /HYSTEROSCOPY;  Surgeon: Jeani Hawking, MD;  Location: [facility] ORS;  Service: Gynecology;;   HYSTEROSCOPY WITH D & C N/A 01/15/2018   Procedure: DILATATION AND CURETTAGE Melton Krebs WITH MYOSURE;  Surgeon: Richardean Chimera, MD;  Location: [facility] ORS;  Service: Gynecology;  Laterality: N/A;   oopherectomy  Right 2001   dermoid tumor   POLYPECTOMY  03/13/2023  Procedure: POLYPECTOMY;  Surgeon: Lynann Bologna, MD;  Location: [facility] ENDOSCOPY;  Service: Gastroenterology;;   TRANSTHORACIC ECHOCARDIOGRAM  08/2017    EF 65 to 70% with vigorous wall motion.  Suggestion of high cardiac output (possibly related to anemia thyrotoxicosis, Pregnancy, sepsis etc.) -- was in setting of symptomatic anemia - Hgb 5.7   UMBILICAL HERNIA REPAIR N/A 01/30/2023   Procedure: HERNIA REPAIR UMBILICAL ADULT WITH MESH;  Surgeon: Abigail Miyamoto, MD;  Location: [facility] OR;  Service: General;  Laterality: N/A;     Outpatient Encounter Medications as of 06/10/2023  Medication Sig Note   acetaminophen (TYLENOL) 500 MG tablet Take 1,000 mg by mouth every 6 (six) hours as needed.    amLODipine  (NORVASC) 5 MG tablet Take 1 tablet (5 mg total) by mouth daily.    BELSOMRA 20 MG TABS Take 1 tablet by mouth at bedtime. (Patient not taking: Reported on 02/03/2023)    cariprazine (VRAYLAR) 3 MG capsule Take 3 mg by mouth at bedtime.    cetirizine (ZYRTEC) 10 MG tablet Take 10 mg by mouth daily.    chlorthalidone (HYGROTON) 25 MG tablet TAKE 1/2 TABLET(12.5 MG) BY MOUTH DAILY    Cholecalciferol (VITAMIN D) 50 MCG (2000 UT) tablet Take 4,000 Units by mouth daily.    clonazePAM (KLONOPIN) 0.5 MG tablet Take 1 tablet (0.5 mg total) by mouth daily as needed for anxiety.    FLUoxetine (PROZAC) 10 MG capsule Take 10 mg by mouth See admin instructions. Take 10 mg daily for 7 days pre-cycle and 3 days during cycle (10 days per month)    fluticasone (FLONASE) 50 MCG/ACT nasal spray Place 1 spray into both nostrils daily.    hydrochlorothiazide (MICROZIDE) 12.5 MG capsule Take daily for 3 days then as needed for swelling    lamoTRIgine (LAMICTAL) 200 MG tablet Take 200 mg by mouth 2 (two) times daily. 01/28/2023: Patient takes at night - 400 mg qhs   lithium carbonate (LITHOBID) 300 MG ER tablet Take 2 tablets (600 mg total) by mouth 2 (two) times daily.    meloxicam (MOBIC) 15 MG tablet Take 1 tablet (15 mg total) by mouth daily.    meloxicam (MOBIC) 7.5 MG tablet TAKE 1 TABLET(7.5 MG) BY MOUTH DAILY    metFORMIN (GLUCOPHAGE) 500 MG tablet Take 1 tablet (500 mg total) by mouth 2 (two) times daily with a meal.    methylPREDNISolone (MEDROL DOSEPAK) 4 MG TBPK tablet Take as directed (Patient not taking: Reported on 03/07/2023)    methylPREDNISolone (MEDROL DOSEPAK) 4 MG TBPK tablet Take as directed for 6 days    metoprolol succinate (TOPROL-XL) 100 MG 24 hr tablet TAKE 1 TABLET BY MOUTH DAILY WITH OR IMMEDIATELY FOLLOWING A MEAL    norethindrone (MICRONOR) 0.35 MG tablet Take 1 tablet by mouth daily.    omeprazole (PRILOSEC OTC) 20 MG tablet Take 1 tablet (20 mg total) by mouth daily.    oxyCODONE (OXY  IR/ROXICODONE) 5 MG immediate release tablet Take 1 tablet (5 mg total) by mouth every 6 (six) hours as needed for moderate pain, severe pain or breakthrough pain. (Patient not taking: Reported on 03/07/2023)    zolpidem (AMBIEN) 10 MG tablet Take 10 mg by mouth at bedtime.    No facility-administered encounter medications on file as of 06/10/2023.     There were no vitals filed for this visit. There is no height or weight on file to calculate BMI.   PHYSICAL EXAM GENERAL:alert, no distress and comfortable SKIN: no rash  EYES: sclera clear NECK: without mass LYMPH:  no palpable cervical or supraclavicular lymphadenopathy  LUNGS: clear with normal breathing effort HEART: regular rate & rhythm, no lower extremity edema ABDOMEN: abdomen soft, non-tender and normal bowel sounds NEURO: alert & oriented x 3 with fluent speech, no focal motor/sensory deficits Breast exam:  PAC without erythema    CBC    Component Value Date/Time   WBC 9.0 01/30/2023 1421   RBC 4.18 01/30/2023 1421   HGB 11.6 (L) 01/30/2023 1421   HGB 11.1 (L) 12/04/2022 0924   HGB 9.2 (L) 09/28/2020 1213   HCT 37.8 01/30/2023 1421   HCT 30.9 (L) 09/28/2020 1213   PLT 346 01/30/2023 1421   PLT 369 12/04/2022 0924   PLT 350 09/28/2020 1213   MCV 90.4 01/30/2023 1421   MCV 76 (L) 09/28/2020 1213   MCH 27.8 01/30/2023 1421   MCHC 30.7 01/30/2023 1421   RDW 16.3 (H) 01/30/2023 1421   RDW 17.0 (H) 09/28/2020 1213   LYMPHSABS 1.3 12/04/2022 0924   MONOABS 0.6 12/04/2022 0924   EOSABS 0.1 12/04/2022 0924   BASOSABS 0.0 12/04/2022 0924     CMP     Component Value Date/Time   NA 138 01/30/2023 1421   NA 138 09/28/2020 1213   K 3.3 (L) 01/30/2023 1421   CL 107 01/30/2023 1421   CO2 21 (L) 01/30/2023 1421   GLUCOSE 95 01/30/2023 1421   BUN 10 01/30/2023 1421   BUN 12 05/02/2022 0000   CREATININE 0.88 01/30/2023 1421   CREATININE 0.85 05/21/2021 0916   CALCIUM 9.3 01/30/2023 1421   PROT 7.8 05/02/2022 1740    PROT 7.0 10/07/2018 1219   ALBUMIN 4.1 05/02/2022 1740   ALBUMIN 4.2 10/07/2018 1219   AST 13 (L) 05/02/2022 1740   AST 9 (L) 05/21/2021 0916   ALT 15 05/02/2022 1740   ALT 15 05/21/2021 0916   ALKPHOS 51 05/02/2022 1740   BILITOT 0.7 05/02/2022 1740   BILITOT 0.5 05/21/2021 0916   GFRNONAA >60 01/30/2023 1421   GFRNONAA >60 05/21/2021 0916   GFRAA 103 09/28/2020 1213   GFRAA >60 02/16/2019 0835     ASSESSMENT & PLAN:[name] Guess is a pleasant 47 yo female with history of uterine fibroids, polyps, PCOS, obesity, HTN, and arthritis found to have severe iron deficiency anemia secondary to chronic blood loss    1. 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 -She required frequent IV Feraheme support in the past, from January- June 2019, then she lost to f/u -when she re-established in 02/2019, she presented with symptomatic anemia with Hg 7.8, labs revealed severe iron deficiency with ferritin <4, serum iron 18, 4% transferrin sat  -She received monthly IV Feraheme from 02/2019 - 04/2019, she responded well and anemia resolved until she became symptomatic again with exertional dyspnea, fatigue, lightheadedness -Received IV iron several times from 06/2022 - 09/2022, last dose 12/2022. clinically, her fatigue has improved -She continues to have heavy menses, under new OB/GYN care undergoing fibroid workup, and recently started progestin to manage bleeding.  This is helping some -EGD/colonoscopy 03/13/23 by Dr. Chales Abrahams showed mild gastritis, 3 polyps in the sigmoid colon, and non-bleeding hemorrhoids. Random biopsy and polyps were benign.     2.  Health and wellness -Previously under care of weight loss wellness center, PCP, cardiology, and Gyn.  She now has new GYN and has reestablished with healthy weight management -Again reviewed age-appropriate cancer screenings this year.  Has never had a  mammogram.     3. Bipolar -On aripiprazole, clonazepam, lamotrigine,  lithium -She is under mental health care and on disability for bipolar but does work some  -Father-in-law passed away 6 months ago -Also has stress from 2 teenage daughters  -Her state worsened recently, she engaged in self-harm and is under the close care of mental health provider, she did not require hospitalization  -Feeling better today, emotional support and active listening given      PLAN:  No orders of the defined types were placed in this encounter.     All questions were answered. The patient knows to call the clinic with any problems, questions or concerns. No barriers to learning were detected. I spent *** counseling the patient face to face. The total time spent in the appointment was *** and more than 50% was on counseling, review of test results, and coordination of care.   Santiago Glad, NP-C @DATE @

## 2023-06-10 ENCOUNTER — Inpatient Hospital Stay: Payer: Medicare Other | Attending: Nurse Practitioner | Admitting: Nurse Practitioner

## 2023-06-10 ENCOUNTER — Inpatient Hospital Stay: Payer: Medicare Other

## 2023-06-10 DIAGNOSIS — F319 Bipolar disorder, unspecified: Secondary | ICD-10-CM | POA: Diagnosis not present

## 2023-06-11 DIAGNOSIS — F411 Generalized anxiety disorder: Secondary | ICD-10-CM | POA: Diagnosis not present

## 2023-06-11 DIAGNOSIS — F3132 Bipolar disorder, current episode depressed, moderate: Secondary | ICD-10-CM | POA: Diagnosis not present

## 2023-06-11 DIAGNOSIS — Z5181 Encounter for therapeutic drug level monitoring: Secondary | ICD-10-CM | POA: Diagnosis not present

## 2023-06-17 DIAGNOSIS — F3289 Other specified depressive episodes: Secondary | ICD-10-CM | POA: Diagnosis not present

## 2023-06-24 DIAGNOSIS — F32A Depression, unspecified: Secondary | ICD-10-CM | POA: Diagnosis not present

## 2023-07-01 DIAGNOSIS — F3289 Other specified depressive episodes: Secondary | ICD-10-CM | POA: Diagnosis not present

## 2023-07-08 DIAGNOSIS — F3289 Other specified depressive episodes: Secondary | ICD-10-CM | POA: Diagnosis not present

## 2023-07-10 DIAGNOSIS — Z5181 Encounter for therapeutic drug level monitoring: Secondary | ICD-10-CM | POA: Diagnosis not present

## 2023-07-10 DIAGNOSIS — F3132 Bipolar disorder, current episode depressed, moderate: Secondary | ICD-10-CM | POA: Diagnosis not present

## 2023-07-10 DIAGNOSIS — F411 Generalized anxiety disorder: Secondary | ICD-10-CM | POA: Diagnosis not present

## 2023-07-15 DIAGNOSIS — F3289 Other specified depressive episodes: Secondary | ICD-10-CM | POA: Diagnosis not present

## 2023-07-17 DIAGNOSIS — M25512 Pain in left shoulder: Secondary | ICD-10-CM | POA: Diagnosis not present

## 2023-07-17 DIAGNOSIS — I10 Essential (primary) hypertension: Secondary | ICD-10-CM | POA: Diagnosis not present

## 2023-07-17 DIAGNOSIS — G8929 Other chronic pain: Secondary | ICD-10-CM | POA: Diagnosis not present

## 2023-07-22 DIAGNOSIS — F3289 Other specified depressive episodes: Secondary | ICD-10-CM | POA: Diagnosis not present

## 2023-07-28 DIAGNOSIS — F3289 Other specified depressive episodes: Secondary | ICD-10-CM | POA: Diagnosis not present

## 2023-08-05 DIAGNOSIS — F3289 Other specified depressive episodes: Secondary | ICD-10-CM | POA: Diagnosis not present

## 2023-08-07 DIAGNOSIS — F3132 Bipolar disorder, current episode depressed, moderate: Secondary | ICD-10-CM | POA: Diagnosis not present

## 2023-08-07 DIAGNOSIS — F411 Generalized anxiety disorder: Secondary | ICD-10-CM | POA: Diagnosis not present

## 2023-08-07 DIAGNOSIS — M25512 Pain in left shoulder: Secondary | ICD-10-CM | POA: Diagnosis not present

## 2023-08-12 DIAGNOSIS — F3289 Other specified depressive episodes: Secondary | ICD-10-CM | POA: Diagnosis not present

## 2023-08-19 DIAGNOSIS — F3289 Other specified depressive episodes: Secondary | ICD-10-CM | POA: Diagnosis not present

## 2023-08-26 DIAGNOSIS — F3289 Other specified depressive episodes: Secondary | ICD-10-CM | POA: Diagnosis not present

## 2023-09-04 DIAGNOSIS — F4325 Adjustment disorder with mixed disturbance of emotions and conduct: Secondary | ICD-10-CM | POA: Diagnosis not present

## 2023-09-04 DIAGNOSIS — F32A Depression, unspecified: Secondary | ICD-10-CM | POA: Diagnosis not present

## 2023-09-04 DIAGNOSIS — F319 Bipolar disorder, unspecified: Secondary | ICD-10-CM | POA: Diagnosis not present

## 2023-09-09 DIAGNOSIS — F3289 Other specified depressive episodes: Secondary | ICD-10-CM | POA: Diagnosis not present

## 2023-09-12 DIAGNOSIS — F4325 Adjustment disorder with mixed disturbance of emotions and conduct: Secondary | ICD-10-CM | POA: Diagnosis not present

## 2023-09-12 DIAGNOSIS — F319 Bipolar disorder, unspecified: Secondary | ICD-10-CM | POA: Diagnosis not present

## 2023-09-12 DIAGNOSIS — F32A Depression, unspecified: Secondary | ICD-10-CM | POA: Diagnosis not present

## 2023-09-16 DIAGNOSIS — F3289 Other specified depressive episodes: Secondary | ICD-10-CM | POA: Diagnosis not present

## 2023-09-18 DIAGNOSIS — F3189 Other bipolar disorder: Secondary | ICD-10-CM | POA: Diagnosis not present

## 2023-09-22 DIAGNOSIS — M25512 Pain in left shoulder: Secondary | ICD-10-CM | POA: Diagnosis not present

## 2023-09-23 DIAGNOSIS — F3289 Other specified depressive episodes: Secondary | ICD-10-CM | POA: Diagnosis not present

## 2023-09-26 DIAGNOSIS — F3189 Other bipolar disorder: Secondary | ICD-10-CM | POA: Diagnosis not present

## 2023-09-30 DIAGNOSIS — F3289 Other specified depressive episodes: Secondary | ICD-10-CM | POA: Diagnosis not present

## 2023-10-03 DIAGNOSIS — F3189 Other bipolar disorder: Secondary | ICD-10-CM | POA: Diagnosis not present

## 2023-10-06 DIAGNOSIS — F3132 Bipolar disorder, current episode depressed, moderate: Secondary | ICD-10-CM | POA: Diagnosis not present

## 2023-10-06 DIAGNOSIS — F411 Generalized anxiety disorder: Secondary | ICD-10-CM | POA: Diagnosis not present

## 2023-10-07 DIAGNOSIS — F319 Bipolar disorder, unspecified: Secondary | ICD-10-CM | POA: Diagnosis not present

## 2023-10-14 DIAGNOSIS — F3289 Other specified depressive episodes: Secondary | ICD-10-CM | POA: Diagnosis not present

## 2023-10-17 DIAGNOSIS — F4325 Adjustment disorder with mixed disturbance of emotions and conduct: Secondary | ICD-10-CM | POA: Diagnosis not present

## 2023-10-17 DIAGNOSIS — F32A Depression, unspecified: Secondary | ICD-10-CM | POA: Diagnosis not present

## 2023-10-21 DIAGNOSIS — N939 Abnormal uterine and vaginal bleeding, unspecified: Secondary | ICD-10-CM | POA: Diagnosis not present

## 2023-10-21 DIAGNOSIS — F3289 Other specified depressive episodes: Secondary | ICD-10-CM | POA: Diagnosis not present

## 2023-10-21 DIAGNOSIS — N841 Polyp of cervix uteri: Secondary | ICD-10-CM | POA: Diagnosis not present

## 2023-10-24 DIAGNOSIS — F3289 Other specified depressive episodes: Secondary | ICD-10-CM | POA: Diagnosis not present

## 2023-10-27 DIAGNOSIS — R059 Cough, unspecified: Secondary | ICD-10-CM | POA: Diagnosis not present

## 2023-10-28 DIAGNOSIS — F3289 Other specified depressive episodes: Secondary | ICD-10-CM | POA: Diagnosis not present

## 2023-10-31 DIAGNOSIS — F3289 Other specified depressive episodes: Secondary | ICD-10-CM | POA: Diagnosis not present

## 2023-11-03 DIAGNOSIS — F3132 Bipolar disorder, current episode depressed, moderate: Secondary | ICD-10-CM | POA: Diagnosis not present

## 2023-11-03 DIAGNOSIS — F411 Generalized anxiety disorder: Secondary | ICD-10-CM | POA: Diagnosis not present

## 2023-11-04 DIAGNOSIS — F319 Bipolar disorder, unspecified: Secondary | ICD-10-CM | POA: Diagnosis not present

## 2023-11-04 DIAGNOSIS — F3189 Other bipolar disorder: Secondary | ICD-10-CM | POA: Diagnosis not present

## 2023-11-07 DIAGNOSIS — F3189 Other bipolar disorder: Secondary | ICD-10-CM | POA: Diagnosis not present

## 2023-11-10 DIAGNOSIS — N939 Abnormal uterine and vaginal bleeding, unspecified: Secondary | ICD-10-CM | POA: Diagnosis not present

## 2023-11-11 DIAGNOSIS — F3289 Other specified depressive episodes: Secondary | ICD-10-CM | POA: Diagnosis not present

## 2023-11-14 DIAGNOSIS — F319 Bipolar disorder, unspecified: Secondary | ICD-10-CM | POA: Diagnosis not present

## 2023-11-14 DIAGNOSIS — F4325 Adjustment disorder with mixed disturbance of emotions and conduct: Secondary | ICD-10-CM | POA: Diagnosis not present

## 2023-11-18 ENCOUNTER — Encounter: Payer: Self-pay | Admitting: Hematology

## 2023-11-18 DIAGNOSIS — F3289 Other specified depressive episodes: Secondary | ICD-10-CM | POA: Diagnosis not present

## 2023-11-20 ENCOUNTER — Telehealth: Payer: Self-pay | Admitting: *Deleted

## 2023-11-20 NOTE — Telephone Encounter (Signed)
   Name: Christine [name]  DOB: Feb 10, 1976  MRN: [id number]  Primary Cardiologist: Randene Bustard, MD  Chart reviewed as part of pre-operative protocol coverage. Because of Christine [name]'s past medical history and time since last visit, she will require a follow-up in-office visit in order to better assess preoperative cardiovascular risk.  Pre-op covering staff: - Please schedule appointment and call patient to inform them. If patient already had an upcoming appointment within acceptable timeframe, please add "pre-op clearance" to the appointment notes so provider is aware. - Please contact requesting surgeon's office via preferred method (i.e, phone, fax) to inform them of need for appointment prior to surgery.    Francene Ing, Retha Cast, NP  11/20/2023, 4:05 PM

## 2023-11-20 NOTE — Telephone Encounter (Signed)
 Left message to call back and schedule IN OFFICE PREOP APPT.

## 2023-11-20 NOTE — Telephone Encounter (Signed)
   Pre-operative Risk Assessment    Patient Name: Christine [name]  DOB: [birth year] MRN: [id number]   Date of last office visit: 01/03/23 Friddie Jetty, DNP Date of next office visit: NONE   Request for Surgical Clearance    Procedure:   TOTAL ABDOMINAL HYSTERECTOMY UNILATERAL SALPINGECTOMY    Date of Surgery:  Clearance 02/19/24                                Surgeon:  DR. Jeannett Million Surgeon's Group or Practice Name:  [facility] Phone number:  [phone] Fax number:  [phone]   Type of Clearance Requested:   - Medical ; NONE INDICATED TO BE HELD   Type of Anesthesia:  General    Additional requests/questions:    [name]   11/20/2023, 3:41 PM

## 2023-11-21 DIAGNOSIS — F4325 Adjustment disorder with mixed disturbance of emotions and conduct: Secondary | ICD-10-CM | POA: Diagnosis not present

## 2023-11-21 DIAGNOSIS — F319 Bipolar disorder, unspecified: Secondary | ICD-10-CM | POA: Diagnosis not present

## 2023-11-21 NOTE — Telephone Encounter (Signed)
 2nd attempt to schedule in office preop appt.

## 2023-11-24 NOTE — Telephone Encounter (Signed)
Routed to requesting surgeons office to make them aware.

## 2023-11-24 NOTE — Telephone Encounter (Signed)
 Pt. Has been scheduled for an in office visit 01/20/24 with [name] Goodrich PA-C at [facility]

## 2023-11-25 ENCOUNTER — Ambulatory Visit (INDEPENDENT_AMBULATORY_CARE_PROVIDER_SITE_OTHER): Admitting: Podiatry

## 2023-11-25 ENCOUNTER — Encounter: Payer: Self-pay | Admitting: Podiatry

## 2023-11-25 DIAGNOSIS — I5189 Other ill-defined heart diseases: Secondary | ICD-10-CM | POA: Insufficient documentation

## 2023-11-25 DIAGNOSIS — E785 Hyperlipidemia, unspecified: Secondary | ICD-10-CM | POA: Insufficient documentation

## 2023-11-25 DIAGNOSIS — M722 Plantar fascial fibromatosis: Secondary | ICD-10-CM | POA: Diagnosis not present

## 2023-11-25 DIAGNOSIS — M7661 Achilles tendinitis, right leg: Secondary | ICD-10-CM | POA: Diagnosis not present

## 2023-11-25 DIAGNOSIS — R5382 Chronic fatigue, unspecified: Secondary | ICD-10-CM | POA: Insufficient documentation

## 2023-11-25 DIAGNOSIS — Z1589 Genetic susceptibility to other disease: Secondary | ICD-10-CM | POA: Insufficient documentation

## 2023-11-25 DIAGNOSIS — H8110 Benign paroxysmal vertigo, unspecified ear: Secondary | ICD-10-CM | POA: Insufficient documentation

## 2023-11-25 DIAGNOSIS — Z86018 Personal history of other benign neoplasm: Secondary | ICD-10-CM | POA: Insufficient documentation

## 2023-11-25 DIAGNOSIS — M2550 Pain in unspecified joint: Secondary | ICD-10-CM | POA: Insufficient documentation

## 2023-11-25 DIAGNOSIS — M7662 Achilles tendinitis, left leg: Secondary | ICD-10-CM | POA: Diagnosis not present

## 2023-11-25 DIAGNOSIS — G43909 Migraine, unspecified, not intractable, without status migrainosus: Secondary | ICD-10-CM | POA: Insufficient documentation

## 2023-11-25 DIAGNOSIS — F3189 Other bipolar disorder: Secondary | ICD-10-CM | POA: Diagnosis not present

## 2023-11-25 DIAGNOSIS — R109 Unspecified abdominal pain: Secondary | ICD-10-CM | POA: Insufficient documentation

## 2023-11-25 DIAGNOSIS — Z9152 Personal history of nonsuicidal self-harm: Secondary | ICD-10-CM | POA: Insufficient documentation

## 2023-11-25 MED ORDER — TRIAMCINOLONE ACETONIDE 40 MG/ML IJ SUSP
20.0000 mg | Freq: Once | INTRAMUSCULAR | Status: AC
Start: 2023-11-25 — End: 2023-11-25
  Administered 2023-11-25: 20 mg

## 2023-11-25 MED ORDER — METHYLPREDNISOLONE 4 MG PO TBPK: ORAL_TABLET | ORAL | 0 refills | Status: DC

## 2023-11-26 NOTE — Progress Notes (Signed)
 She presents today for follow-up of her right heel pain.  States that the Achilles is still tender but I really hurt right down here as she points to the plantar aspect of her right heel.  Objective: Vital signs stable oriented x 3 she has severe pain on palpation medial calcaneal tubercle of the right heel some tenderness on the lateral aspect of the fourth fifth tarsometatarsal joint as well as the Achilles.  Assessment: Plantar fasciitis lateral compensatory syndrome with posterior tibial tendinitis and Achilles tendinitis also demonstrates some capsulitis lateral foot.  Plan: Injected the plantar fascia today 20 mg Kenalog  5 mg Marcaine  she will continue to use night splints and she will start on methylprednisolone .  I will follow-up with her in 6 weeks

## 2023-12-02 DIAGNOSIS — F3189 Other bipolar disorder: Secondary | ICD-10-CM | POA: Diagnosis not present

## 2023-12-09 DIAGNOSIS — F3189 Other bipolar disorder: Secondary | ICD-10-CM | POA: Diagnosis not present

## 2023-12-12 DIAGNOSIS — F32A Depression, unspecified: Secondary | ICD-10-CM | POA: Diagnosis not present

## 2023-12-12 DIAGNOSIS — F4325 Adjustment disorder with mixed disturbance of emotions and conduct: Secondary | ICD-10-CM | POA: Diagnosis not present

## 2023-12-16 DIAGNOSIS — F3189 Other bipolar disorder: Secondary | ICD-10-CM | POA: Diagnosis not present

## 2023-12-19 DIAGNOSIS — F3289 Other specified depressive episodes: Secondary | ICD-10-CM | POA: Diagnosis not present

## 2023-12-22 ENCOUNTER — Telehealth: Payer: Self-pay | Admitting: Podiatry

## 2023-12-22 NOTE — Telephone Encounter (Signed)
LVM to resched

## 2023-12-23 DIAGNOSIS — F3189 Other bipolar disorder: Secondary | ICD-10-CM | POA: Diagnosis not present

## 2023-12-25 ENCOUNTER — Other Ambulatory Visit: Payer: Self-pay | Admitting: Cardiology

## 2023-12-26 DIAGNOSIS — F3189 Other bipolar disorder: Secondary | ICD-10-CM | POA: Diagnosis not present

## 2023-12-30 DIAGNOSIS — F3189 Other bipolar disorder: Secondary | ICD-10-CM | POA: Diagnosis not present

## 2024-01-02 DIAGNOSIS — F4325 Adjustment disorder with mixed disturbance of emotions and conduct: Secondary | ICD-10-CM | POA: Diagnosis not present

## 2024-01-02 DIAGNOSIS — F319 Bipolar disorder, unspecified: Secondary | ICD-10-CM | POA: Diagnosis not present

## 2024-01-06 ENCOUNTER — Ambulatory Visit: Admitting: Podiatry

## 2024-01-06 DIAGNOSIS — F3189 Other bipolar disorder: Secondary | ICD-10-CM | POA: Diagnosis not present

## 2024-01-12 ENCOUNTER — Encounter: Payer: Self-pay | Admitting: Hematology

## 2024-01-13 DIAGNOSIS — F319 Bipolar disorder, unspecified: Secondary | ICD-10-CM | POA: Diagnosis not present

## 2024-01-13 NOTE — Progress Notes (Addendum)
 Cardiology Office Note:    Date:  01/20/2024   ID:  Omie Ferger, DOB [birth year], MRN [id number]  PCP:  Chyrel Craw, NP  Cardiologist:  Randene Bustard, MD     Referring MD: Chyrel Craw, NP   Chief Complaint: pre-op evaluation for upcoming hysterectomy  History of Present Illness:    Christine [name] is a 48 y.o. obese African-American female with a history of normal coronaries on coronary CTA in 03/2019, chronic HFpEF, possible pulmonary hypertension based on dilated pulmonary artery on CTA, hypertension, type 2 diabetes mellitus, obstructive sleep apnea on CPAP, symptomatic anemia requiring blood transfusions in 2019, and bipolar disorder who is followed by Dr. Addie Holstein and presents today for pre-op evaluation.  Patient was referred to Dr. Addie Holstein in 02/2019 for further evaluation for atypical chest pain and dyspnea. Coronary CTA was ordered for further evaluation and showed coronary calcium score of 0 with no evidence of CAD and dilated pulmonary artery at 37mm which could suggest pulmonary hypertension. Echo in 06/2019 showed LVEF of 65-70%, normal RV size and function, with no significant valvular disease. The TR signal was inadequate for assessing pulmonary artery systolic pressure. Possible pulmonary hypertension was felt to be secondary to obesity, obesity hypoventilation syndrome, and obstructive sleep apnea. She was not felt to have any significant pulmonary hypertension given normal RV and RA; therefore, RHC was not pursed. She was admitted in 08/2019 for acute diastolic CHF and chest pain. Repeat Echo showed LVEF of 60-65%, normal RV size and function, and no significant valvular disease. She was diuresed with improvement in symptoms.   Patient was last seen by Friddie Jetty, NP, in 12/2022 at which time she was doing well from a cardiac standpoint.   She presents today for pre-op evaluation for upcoming hysterectomy. She reports feeling a little more shortness of breath over the  last 2 months. She describes dyspnea when walking around the the house and walking to the mailbox which is new. She had pneumonia at the end of March and symptoms seemed to start after this. She also reports gaining about 7lbs over the last 2 months (after previously losing 7 lbs) due to a depressive episode. She thinks this may be contributing. She is also wondering if she could of had a drop in her hemoglobin. She has a history of iron  deficiency anemia and has previously required iron  infusions. She has had dyspnea with anemia in the past. She denies any dyspnea at rest, orthopnea, PND, or edema. No chest pian, lightheadedness, dizziness, or syncope.   She is able to complete at least 5.72 METs of physical activity and denies any chest pain with this.  EKGs/Labs/Other Studies Reviewed:    The following studies were reviewed:  Coronary CTA 04/05/2019: Impressions: 1. No evidence of CAD, CADRADS = 0. 2. Coronary calcium score of 0. This was 0 percentile for age and sex matched control. 3. Normal coronary origin with right dominance. 4. Dilated PA at 37 mm which could suggest pulmonary hypertension. _______________  Echocardiogram 08/09/2019: Impressions: 1. Left ventricular ejection fraction, by visual estimation, is 60 to  65%. The left ventricle has normal function. There is no left ventricular  hypertrophy.   2. The left ventricle has no regional wall motion abnormalities.   3. Global right ventricle has normal systolic function.The right  ventricular size is normal. No increase in right ventricular wall  thickness.   4. Left atrial size was normal.   5. Right atrial size was normal.   6. The mitral  valve is normal in structure. No evidence of mitral valve  regurgitation.   7. The tricuspid valve is normal in structure.   8. The aortic valve is normal in structure. Aortic valve regurgitation is  not visualized.   9. The pulmonic valve was normal in structure. Pulmonic valve   regurgitation is not visualized.  10. The atrial septum is grossly normal.   EKG:  EKG ordered today.   EKG Interpretation Date/Time:  Tuesday January 20 2024 08:06:14 EDT Ventricular Rate:  76 PR Interval:  174 QRS Duration:  86 QT Interval:  382 QTC Calculation: 429 R Axis:   90  Text Interpretation: Normal sinus rhythm with sinus arrhythmia Rightward axis No acute ST/ T wave changes Confirmed by Brennin Durfee [phone]) on 01/20/2024 8:10:45 AM    Recent Labs: 01/30/2023: BUN 10; Creatinine, Ser 0.88; Hemoglobin 11.6; Platelets 346; Potassium 3.3; Sodium 138  Recent Lipid Panel    Component Value Date/Time   CHOL 178 05/09/2022 1552   TRIG 81 05/09/2022 1552   HDL 38 (L) 05/09/2022 1552   LDLCALC 125 (H) 05/09/2022 1552    Physical Exam:    Vital Signs: BP 120/80 (BP Location: Left Arm, Patient Position: Sitting, Cuff Size: Large)   Pulse 80   Resp 20   Ht 5' 4 (1.626 m)   Wt (!) 325 lb 3.2 oz (147.5 kg)   SpO2 95%   BMI 55.82 kg/m     Wt Readings from Last 3 Encounters:  01/20/24 (!) 325 lb 3.2 oz (147.5 kg)  03/13/23 (!) 325 lb (147.4 kg)  02/03/23 (!) 325 lb (147.4 kg)     General: 48 y.o. morbidly obese African-American female in no acute distress. HEENT: Normocephalic and atraumatic. Sclera clear.  Neck: Supple. Soft bruits bilaterally (suspect radiation form murmur). JVD difficult to assess due to body habitus. Heart: RRR. Distinct S1 and S2. III/ VI systolic murmur.  Lungs: No increased work of breathing. Clear to ausculation bilaterally. No wheezes, rhonchi, or rales.  Extremities: Trace lower extremity edema bilaterally.  Skin: Warm and dry. Neuro: No focal deficits. Psych: Normal affect. Responds appropriately.   Assessment:    1. Pre-operative clearance   2. Dyspnea on exertion   3. Chronic heart failure with preserved ejection fraction (HFpEF) (HCC)   4. Systolic murmur   5. Pulmonary hypertension, unspecified (HCC)   6. Obstructive sleep  apnea   7. Essential hypertension   8. Type 2 diabetes mellitus with obesity (HCC)   9. Morbid obesity (HCC)     Plan:    Pre-Op Evaluation Patient has upcoming hysterectomy planned. She does describe some dyspnea on exertion over the last 2 months but no angina or any other CHF symptoms. She is able to complete >4.0 METs of physical activity without any chest pain. Per Revised Cardiac Risk Index score = 2 (intraperitoneal surgery and CHF) indicating at 10.1% risk of major cardiac event.  Low suspicion that dyspnea is cardiac in nature but will check BNP and update Echo given she has not had an Echo since 2021 and has a murmur on exam. If Echo shows normal LV function/ wall motion and no significant valvular disease, will be okay to proceed with surgery.   Dyspnea on Exertion Patient describes some mild dyspnea on exertion over the last 2 months which is new. Symptoms started after she was diagnosed with pneumonia.   - No other CHF symptoms. She reports gaining 7 lbs in the last 2 months in setting of a  major depressive episode. Volume status difficult to assess due to body habitus but does not look overtly volume overloaded.  - Suspect dyspnea is secondary to recent pneumonia. She also has a history of iron  deficiency anemia and has had dyspnea with this in the past. She has previously required iron  transfusion but has not had one in a while. She had CBC and iron  panel checked at Rheumatologist office yesterday but I am unable to see the results of these. Asked patient to update us  via MyChart when she gets these results. - Given weight gain and need for surgery, will check BNP/ BMET and update Echo.  - Low suspicion that dyspnea is an anginal equivalent. Prior coronary CTA in 2020 showed normal coronaries and no chest pain.  Chronic HFpEF Last Echo in 08/2019 showed LVEF of 60-65%, normal RV size and function, and no significant valvular disease.  - She does report new dyspnea on exertion as  above. Volume status difficult to assess due to body habitus but does not look overtly volume overloaded. - Continue Chlorthalidone  12.5mg  daily.  - Don't suspect dyspnea is due to CHF but will recheck BNP and BMET today. Will also update Echo since she has not had one since 2021.   Systolic Murmur Noted on exam. Previously felt to be a flow murmur. Last Echo in 2021 showed no significant valvular disease.  - Will repeat Echo.  Possible Pulmonary Hypertension Obstructive Sleep Apnea Chest CTA in 2020 showed dilated pulmonary artery suggestive of pulmonary hypertension. However, normal right atria and right ventricle on Echo in 2020 and 2021. Possible pulmonary hypertension felt to likely be due to obesity, obesity hypoventilation syndrome, and obstructive sleep apnea.  - Continue CPAP.  Hypertension BP well controlled.  - Continue current medications: Amlodipine  5mg  daily, Chlorthalidone  12.5mg  daily, and Toprol -XL 100mg  daily. - Of note, she also has HCTZ on her list but states she has not taken that in a long time. Will remove from list.  Type 2 Diabetes Mellitus Hemoglobin A1c 5.2% in 12/2022 (per [facility]). - On Metformin . - Management per PCP.  Obesity BMI 55.82.  - She would greatly benefit from weight loss. Did not have time to discuss in great detail today given she was her for pre-op evaluation.  Disposition: Follow up in 1 year assuming Echo is unremarkable.   Signed, Casimer Clear, PA-C  01/20/2024 11:02 AM    [facility]  ADDENDUM 01/22/2024: BNP came back normal. Echo showed LVEF of 60-65% with normal wall motion and diastolic function, normal RV size and function with moderately elevated PASP, and no significant valvular disease. OK to proceed with planned surgery from a cardiac standpoint. I was able to review recent labs from Rheumatology office and hemoglobin as dropped to 7.7 (down from 11.6 in 01/2023). Suspect this is likely the cause of her new shortness  of breath. Will defer to PCP or ordering provider of those labs on if any additional evaluation or transfusions are needed for her anemia prior to surgery.  Allyne Hebert E Shanequa Marvin, PA-C 01/22/2024 7:09 AM

## 2024-01-16 DIAGNOSIS — F3189 Other bipolar disorder: Secondary | ICD-10-CM | POA: Diagnosis not present

## 2024-01-19 DIAGNOSIS — R5383 Other fatigue: Secondary | ICD-10-CM | POA: Diagnosis not present

## 2024-01-19 DIAGNOSIS — Z79899 Other long term (current) drug therapy: Secondary | ICD-10-CM | POA: Diagnosis not present

## 2024-01-19 DIAGNOSIS — M1991 Primary osteoarthritis, unspecified site: Secondary | ICD-10-CM | POA: Diagnosis not present

## 2024-01-19 DIAGNOSIS — Z6841 Body Mass Index (BMI) 40.0 and over, adult: Secondary | ICD-10-CM | POA: Diagnosis not present

## 2024-01-19 DIAGNOSIS — M7662 Achilles tendinitis, left leg: Secondary | ICD-10-CM | POA: Diagnosis not present

## 2024-01-19 DIAGNOSIS — M7661 Achilles tendinitis, right leg: Secondary | ICD-10-CM | POA: Diagnosis not present

## 2024-01-19 DIAGNOSIS — Z1589 Genetic susceptibility to other disease: Secondary | ICD-10-CM | POA: Diagnosis not present

## 2024-01-19 DIAGNOSIS — E611 Iron deficiency: Secondary | ICD-10-CM | POA: Diagnosis not present

## 2024-01-19 DIAGNOSIS — M5459 Other low back pain: Secondary | ICD-10-CM | POA: Diagnosis not present

## 2024-01-19 DIAGNOSIS — R21 Rash and other nonspecific skin eruption: Secondary | ICD-10-CM | POA: Diagnosis not present

## 2024-01-20 ENCOUNTER — Ambulatory Visit: Attending: Student | Admitting: Student

## 2024-01-20 ENCOUNTER — Encounter: Payer: Self-pay | Admitting: Podiatry

## 2024-01-20 ENCOUNTER — Encounter: Payer: Self-pay | Admitting: Student

## 2024-01-20 VITALS — BP 120/80 | HR 80 | Resp 20 | Ht 64.0 in | Wt 325.2 lb

## 2024-01-20 DIAGNOSIS — E1169 Type 2 diabetes mellitus with other specified complication: Secondary | ICD-10-CM | POA: Diagnosis not present

## 2024-01-20 DIAGNOSIS — R011 Cardiac murmur, unspecified: Secondary | ICD-10-CM | POA: Insufficient documentation

## 2024-01-20 DIAGNOSIS — I5032 Chronic diastolic (congestive) heart failure: Secondary | ICD-10-CM | POA: Diagnosis not present

## 2024-01-20 DIAGNOSIS — R0609 Other forms of dyspnea: Secondary | ICD-10-CM | POA: Diagnosis not present

## 2024-01-20 DIAGNOSIS — Z01818 Encounter for other preprocedural examination: Secondary | ICD-10-CM | POA: Insufficient documentation

## 2024-01-20 DIAGNOSIS — E669 Obesity, unspecified: Secondary | ICD-10-CM | POA: Insufficient documentation

## 2024-01-20 DIAGNOSIS — I10 Essential (primary) hypertension: Secondary | ICD-10-CM | POA: Insufficient documentation

## 2024-01-20 DIAGNOSIS — G4733 Obstructive sleep apnea (adult) (pediatric): Secondary | ICD-10-CM | POA: Insufficient documentation

## 2024-01-20 DIAGNOSIS — F3189 Other bipolar disorder: Secondary | ICD-10-CM | POA: Diagnosis not present

## 2024-01-20 DIAGNOSIS — I272 Pulmonary hypertension, unspecified: Secondary | ICD-10-CM | POA: Insufficient documentation

## 2024-01-20 NOTE — Patient Instructions (Signed)
 Medication Instructions:  Stop hydrochlorothiazide  Continue Chlorthalidone  12.5 mg daily *If you need a refill on your cardiac medications before your next appointment, please call your pharmacy*  Lab Work: BMP, BNP today If you have labs (blood work) drawn today and your tests are completely normal, you will receive your results only by: MyChart Message (if you have MyChart) OR A paper copy in the mail If you have any lab test that is abnormal or we need to change your treatment, we will call you to review the results.  Testing/Procedures: Your physician has requested that you have an echocardiogram. Echocardiography is a painless test that uses sound waves to create images of your heart. It provides your doctor with information about the size and shape of your heart and how well your heart's chambers and valves are working. This procedure takes approximately one hour. There are no restrictions for this procedure. Please do NOT wear cologne, perfume, aftershave, or lotions (deodorant is allowed). Please arrive 15 minutes prior to your appointment time.  Please note: We ask at that you not bring children with you during ultrasound (echo/ vascular) testing. Due to room size and safety concerns, children are not allowed in the ultrasound rooms during exams. Our front office staff cannot provide observation of children in our lobby area while testing is being conducted. An adult accompanying a patient to their appointment will only be allowed in the ultrasound room at the discretion of the ultrasound technician under special circumstances. We apologize for any inconvenience.   Follow-Up: At [facility], you and your health needs are our priority.  As part of our continuing mission to provide you with exceptional heart care, our providers are all part of one team.  This team includes your primary Cardiologist (physician) and Advanced Practice Providers or APPs (Physician Assistants and  Nurse Practitioners) who all work together to provide you with the care you need, when you need it.  Your next appointment:   1 year(s)  Provider:   Dr Addie Holstein or Sharren Decree, PA-C   We recommend signing up for the patient portal called MyChart.  Sign up information is provided on this After Visit Summary.  MyChart is used to connect with patients for Virtual Visits (Telemedicine).  Patients are able to view lab/test results, encounter notes, upcoming appointments, etc.  Non-urgent messages can be sent to your provider as well.   To learn more about what you can do with MyChart, go to [email or web address].

## 2024-01-21 ENCOUNTER — Ambulatory Visit: Payer: Self-pay | Admitting: Student

## 2024-01-21 ENCOUNTER — Ambulatory Visit (HOSPITAL_COMMUNITY)
Admission: RE | Admit: 2024-01-21 | Discharge: 2024-01-21 | Disposition: A | Discharge status: Home / Self Care | Source: Ambulatory Visit | Attending: Cardiology | Admitting: Cardiology

## 2024-01-21 DIAGNOSIS — R0609 Other forms of dyspnea: Secondary | ICD-10-CM | POA: Diagnosis not present

## 2024-01-21 DIAGNOSIS — Z01818 Encounter for other preprocedural examination: Secondary | ICD-10-CM

## 2024-01-21 LAB — BASIC METABOLIC PANEL WITH GFR
BUN/Creatinine Ratio: 8 — ABNORMAL LOW (ref 9–23)
BUN: 7 mg/dL (ref 6–24)
CO2: 18 mmol/L — ABNORMAL LOW (ref 20–29)
Calcium: 9.7 mg/dL (ref 8.7–10.2)
Chloride: 102 mmol/L (ref 96–106)
Creatinine, Ser: 0.89 mg/dL (ref 0.57–1.00)
Glucose: 89 mg/dL (ref 70–99)
Potassium: 4.1 mmol/L (ref 3.5–5.2)
Sodium: 137 mmol/L (ref 134–144)
eGFR: 81 mL/min/{1.73_m2} (ref >59)

## 2024-01-21 LAB — ECHOCARDIOGRAM COMPLETE
AV Mean grad: 11.3 mmHg
AV Peak grad: 23.1 mmHg
Ao pk vel: 2.41 m/s
Area-P 1/2: 3.4 cm2
P 1/2 time: 512 ms
S' Lateral: 3.2 cm

## 2024-01-21 LAB — BRAIN NATRIURETIC PEPTIDE: BNP: 26.7 pg/mL (ref 0.0–100.0)

## 2024-01-22 DIAGNOSIS — B351 Tinea unguium: Secondary | ICD-10-CM | POA: Diagnosis not present

## 2024-01-22 DIAGNOSIS — L309 Dermatitis, unspecified: Secondary | ICD-10-CM | POA: Diagnosis not present

## 2024-01-25 DIAGNOSIS — F319 Bipolar disorder, unspecified: Secondary | ICD-10-CM | POA: Diagnosis not present

## 2024-01-25 DIAGNOSIS — F4325 Adjustment disorder with mixed disturbance of emotions and conduct: Secondary | ICD-10-CM | POA: Diagnosis not present

## 2024-01-27 ENCOUNTER — Encounter: Payer: Self-pay | Admitting: Rheumatology

## 2024-01-27 DIAGNOSIS — F3189 Other bipolar disorder: Secondary | ICD-10-CM | POA: Diagnosis not present

## 2024-01-28 ENCOUNTER — Encounter (HOSPITAL_COMMUNITY): Payer: Self-pay

## 2024-01-28 ENCOUNTER — Emergency Department (HOSPITAL_COMMUNITY)
Admission: EM | Admit: 2024-01-28 | Discharge: 2024-01-29 | Disposition: A | Discharge status: Home / Self Care | Attending: Emergency Medicine | Admitting: Emergency Medicine

## 2024-01-28 ENCOUNTER — Other Ambulatory Visit: Payer: Self-pay

## 2024-01-28 ENCOUNTER — Emergency Department (HOSPITAL_COMMUNITY)

## 2024-01-28 DIAGNOSIS — Z79899 Other long term (current) drug therapy: Secondary | ICD-10-CM | POA: Insufficient documentation

## 2024-01-28 DIAGNOSIS — Z7984 Long term (current) use of oral hypoglycemic drugs: Secondary | ICD-10-CM | POA: Diagnosis not present

## 2024-01-28 DIAGNOSIS — R0600 Dyspnea, unspecified: Secondary | ICD-10-CM | POA: Diagnosis not present

## 2024-01-28 DIAGNOSIS — I5032 Chronic diastolic (congestive) heart failure: Secondary | ICD-10-CM | POA: Diagnosis not present

## 2024-01-28 DIAGNOSIS — N939 Abnormal uterine and vaginal bleeding, unspecified: Secondary | ICD-10-CM | POA: Diagnosis not present

## 2024-01-28 DIAGNOSIS — R5383 Other fatigue: Secondary | ICD-10-CM | POA: Diagnosis not present

## 2024-01-28 DIAGNOSIS — E119 Type 2 diabetes mellitus without complications: Secondary | ICD-10-CM | POA: Insufficient documentation

## 2024-01-28 DIAGNOSIS — I110 Hypertensive heart disease with heart failure: Secondary | ICD-10-CM | POA: Diagnosis not present

## 2024-01-28 DIAGNOSIS — R0602 Shortness of breath: Secondary | ICD-10-CM | POA: Diagnosis not present

## 2024-01-28 DIAGNOSIS — D500 Iron deficiency anemia secondary to blood loss (chronic): Secondary | ICD-10-CM | POA: Diagnosis not present

## 2024-01-28 DIAGNOSIS — D62 Acute posthemorrhagic anemia: Secondary | ICD-10-CM | POA: Diagnosis not present

## 2024-01-28 DIAGNOSIS — N938 Other specified abnormal uterine and vaginal bleeding: Secondary | ICD-10-CM | POA: Diagnosis not present

## 2024-01-28 DIAGNOSIS — R531 Weakness: Secondary | ICD-10-CM

## 2024-01-28 DIAGNOSIS — R0609 Other forms of dyspnea: Secondary | ICD-10-CM

## 2024-01-28 LAB — BASIC METABOLIC PANEL WITH GFR
Anion gap: 8 (ref 5–15)
BUN: 17 mg/dL (ref 6–20)
CO2: 22 mmol/L (ref 22–32)
Calcium: 9.4 mg/dL (ref 8.9–10.3)
Chloride: 106 mmol/L (ref 98–111)
Creatinine, Ser: 1.12 mg/dL — ABNORMAL HIGH (ref 0.44–1.00)
GFR, Estimated: >60 mL/min (ref >60)
Glucose, Bld: 91 mg/dL (ref 70–99)
Potassium: 3.9 mmol/L (ref 3.5–5.1)
Sodium: 136 mmol/L (ref 135–145)

## 2024-01-28 LAB — CBC
HCT: 29 % — ABNORMAL LOW (ref 36.0–46.0)
Hemoglobin: 7.9 g/dL — ABNORMAL LOW (ref 12.0–15.0)
MCH: 20.6 pg — ABNORMAL LOW (ref 26.0–34.0)
MCHC: 27.2 g/dL — ABNORMAL LOW (ref 30.0–36.0)
MCV: 75.7 fL — ABNORMAL LOW (ref 80.0–100.0)
Platelets: 453 10*3/uL — ABNORMAL HIGH (ref 150–400)
RBC: 3.83 MIL/uL — ABNORMAL LOW (ref 3.87–5.11)
RDW: 19 % — ABNORMAL HIGH (ref 11.5–15.5)
WBC: 7.6 10*3/uL (ref 4.0–10.5)
nRBC: 0 % (ref 0.0–0.2)

## 2024-01-28 LAB — HCG, SERUM, QUALITATIVE: Preg, Serum: NEGATIVE

## 2024-01-28 LAB — PREPARE RBC (CROSSMATCH)

## 2024-01-28 MED ORDER — TRANEXAMIC ACID 650 MG PO TABS: 1300.0000 mg | ORAL_TABLET | Freq: Three times a day (TID) | ORAL | 0 refills | Status: AC

## 2024-01-28 MED ORDER — SODIUM CHLORIDE 0.9% IV SOLUTION: Freq: Once | INTRAVENOUS | Status: DC

## 2024-01-28 MED ORDER — TRANEXAMIC ACID 650 MG PO TABS
1300.0000 mg | ORAL_TABLET | Freq: Three times a day (TID) | ORAL | Status: DC
  Administered 2024-01-28: 1300 mg via ORAL
  Filled 2024-01-28: qty 2

## 2024-01-28 NOTE — Discharge Instructions (Addendum)
 Thank you for coming to [facility] Emergency Department. You were seen for vaginal bleeding and low hemoglobin 7.9. We spoke with gynecology who recommended a blood transfusion in preparation for your surgery in a few weeks. We will also treat your bleeding with TXA two tablets three times per day by mouth for 5 days.   Please follow up with your hematologist on 7/1 for lab recheck.   Do not hesitate to return to the ED or call 911 if you experience: -Worsening symptoms -Lightheadedness, passing out -Fevers/chills -Anything else that concerns you

## 2024-01-28 NOTE — ED Provider Notes (Signed)
 [place] EMERGENCY DEPARTMENT AT [facility] Provider Note   CSN: [id number] Arrival date & time: 01/28/24  1143     History  Chief Complaint  Patient presents with   Dizziness   Fatigue   Shortness of Breath    Christine [name] is a 48 y.o. female with chronic HFpEF, possible pulmonary hypertension based on dilated pulmonary artery on CTA, hypertension, type 2 diabetes mellitus, morbid obesity, PCOS, HTN, obstructive sleep apnea on CPAP, +HLA-B27, symptomatic anemia requiring blood transfusions in 2019, and bipolar disorder  who presents with fatigue, DOE, dizziness, HA, low hemoglobin. Patient saw cardiology on 01/20/24 for pre-op evaluation for upcoming hysterectomy for fibroids and h/o DUB. Endorsed new DOE x 2 months. Saw rheumatologist on 01/19/24 and found Hgb 7.7. Scheduled appt with hematologist next week 02/03/24. Menstrual period started on Monday, currently having vaginal bleeding, about the same or a little lighter than her normal period. [facility], hysterectomy scheduled for 02/19/24. Denies chest pain, abdominal pain, leg swelling, f/c, urinary sxs.   Past Medical History:  Diagnosis Date   Allergy    Anemia    w/ blood transfusion;  Presumably from menorrhagia/menomenorrhagia; has had both blood and iron  transfusions   Anxiety    Back pain    no current problems as of 01/28/23   Bipolar 1 disorder (HCC)    With depression   Chest pain    CHF (congestive heart failure) (HCC)    Depression    Diastolic heart failure (HCC)    Dyspnea    exertion   Essential hypertension    Fibromyalgia    History of blood transfusion 08/2017   [facility]   History of hiatal hernia    IBS (irritable bowel syndrome)    no current problems as of 01/28/23   Infertility, female    Lactose intolerance    Lower extremity edema    Migraines    Morbid obesity (HCC)    Osteoarthritis    Hands, ankles; HLA-B27 positive   PCOS (polycystic ovarian syndrome)     treated metformin    Pneumonia    x several   Pre-diabetes    5.7 hbga!c 05/02/22   Seasonal allergies    With recurrent allergic rhinitis   Sleep apnea    uses nightly   SOB (shortness of breath)    Uterine leiomyoma    Vitamin D  deficiency        Home Medications Prior to Admission medications   Medication Sig Start Date End Date Taking? Authorizing Provider  acetaminophen  (TYLENOL ) 500 MG tablet Take 1,000 mg by mouth every 6 (six) hours as needed.    [provider]  amLODipine  (NORVASC ) 5 MG tablet Take 1 tablet (5 mg total) by mouth daily. 08/10/19 01/20/24  Jens Durand, MD  cetirizine (ZYRTEC) 10 MG tablet Take 10 mg by mouth daily.    [provider]  chlorthalidone  (HYGROTON ) 25 MG tablet Take 0.5 tablets (12.5 mg total) by mouth daily. Please keep scheduled appointment for future refills. Thank you. 12/25/23   Anner Alm ORN, MD  Cholecalciferol (VITAMIN D ) 50 MCG (2000 UT) tablet Take 4,000 Units by mouth daily.    [provider]  clonazePAM  (KLONOPIN ) 0.5 MG tablet Take 1 tablet (0.5 mg total) by mouth daily as needed for anxiety. 08/13/21   [name], Syed T, MD  FLUoxetine (PROZAC) 10 MG capsule Take 10 mg by mouth See admin instructions. Take 10 mg daily for 7 days pre-cycle  and 3 days during cycle (10 days per month) 01/07/23   [provider]  fluticasone  (FLONASE ) 50 MCG/ACT nasal spray Place 1 spray into both nostrils daily.    [provider]  lamoTRIgine  (LAMICTAL ) 200 MG tablet Take 200 mg by mouth 2 (two) times daily.    [provider]  lithium  carbonate (LITHOBID ) 300 MG ER tablet Take 2 tablets (600 mg total) by mouth 2 (two) times daily. 08/13/22   Pashayan, Marsa RAMAN, DO  meloxicam  (MOBIC ) 15 MG tablet Take 1 tablet (15 mg total) by mouth daily. 03/07/23   Standiford, Marsa FALCON, DPM  metFORMIN  (GLUCOPHAGE ) 500 MG tablet Take 1 tablet (500 mg total) by mouth 2 (two) times daily with a meal. 10/20/19   Verdon Parry D, MD  metoprolol  succinate (TOPROL -XL) 100 MG 24 hr tablet TAKE 1 TABLET BY MOUTH DAILY WITH OR IMMEDIATELY FOLLOWING A MEAL 02/12/23   Anner Alm ORN, MD  SEROQUEL 50 MG tablet 1 tablet at bedtime Orally Once a day    [provider]      Allergies    Penicillins    Review of Systems   Review of Systems A 10 point review of systems was performed and is negative unless otherwise reported in HPI.  Physical Exam Updated Vital Signs BP (!) 152/72   Pulse (!) 57   Temp 98.2 F (36.8 C) (Oral)   Resp 18   Ht 5' 4 (1.626 m)   Wt (!) 147 kg   LMP 01/25/2024   SpO2 100%   BMI 55.63 kg/m  Physical Exam General: Normal appearing female, lying in bed.  HEENT: PERRLA, Sclera anicteric, MMM, trachea midline.  Cardiology: RRR, no murmurs/rubs/gallops.  Resp: Normal respiratory rate and effort. CTAB, no wheezes, rhonchi, crackles.  Abd: Soft, non-tender, non-distended. No rebound tenderness or guarding.  GU: Deferred. MSK: No peripheral edema or signs of trauma. Extremities without deformity or TTP. No cyanosis or clubbing. Skin: warm, dry Neuro: A&Ox4, CNs II-XII grossly intact. MAEs. Sensation grossly intact.  Psych: Normal mood and affect.   ED Results / Procedures / Treatments   Labs (all labs ordered are listed, but only abnormal results are displayed) Labs Reviewed  BASIC METABOLIC PANEL WITH GFR - Abnormal; Notable for the following components:      Result Value   Creatinine, Ser 1.12 (*)    All other components within normal limits  CBC - Abnormal; Notable for the following components:   RBC 3.83 (*)    Hemoglobin 7.9 (*)    HCT 29.0 (*)    MCV 75.7 (*)    MCH 20.6 (*)    MCHC 27.2 (*)    RDW 19.0 (*)    Platelets 453 (*)    All other components within normal limits  HCG, SERUM, QUALITATIVE  TYPE AND SCREEN    EKG None  Radiology DG Chest 2 View Result Date: 01/28/2024 CLINICAL DATA:  Shortness of breath and fatigue. EXAM: CHEST - 2 VIEW  COMPARISON:  05/02/2022 FINDINGS: Prominent central vascular markings are stable. No overt pulmonary edema. No focal airspace disease. Heart size is upper limits of normal but stable. The trachea is midline. No pleural effusions. No acute bone abnormality. IMPRESSION: No acute cardiopulmonary disease. Electronically Signed   By: Juliene Balder M.D.   On: 01/28/2024 12:50    Procedures Procedures    Medications Ordered in ED Medications - No data to display  ED Course/ Medical Decision Making/ A&P  Medical Decision Making Amount and/or Complexity of Data Reviewed Labs: ordered. Decision-making details documented in ED Course. Radiology: ordered. Decision-making details documented in ED Course.  Risk Prescription drug management.    This patient presents to the ED for concern of generalized weakness, dyspnea, this involves an extensive number of treatment options, and is a complaint that carries with it a high risk of complications and morbidity.  I considered the following differential and admission for this acute, potentially life threatening condition.  Hemodynamically stable and overall very well-appearing, albeit mildly hypertensive.  MDM:    DDX for generalized weakness includes but is not limited to:  Patient is found to have hemoglobin 7.9 with active bleeding, though she states that her vaginal bleeding is actually less than a normal period.  She is hemodynamically stable with no indication of active hemorrhaging or hemorrhagic shock.  For her generalized weakness, no electrolyte derangements, significant hypo-/hyperglycemia,.  With her dyspnea, chest x-ray is clear.  Likely coming from her anemia.  She is supposed to have a hysterectomy as a treatment for her fibroids and dysfunctional uterine bleeding and will discuss with OB/GYN about next steps, possibly including transfusion preop.   Clinical Course as of 02/06/24 1523  Wed Jan 28, 2024  1758  Hemoglobin(!): 7.9 Dropped from 11.6 one year ago [HN]  1802 DG Chest 2 View No acute cardiopulmonary disease. [HN]  1802 Preg, Serum: NEGATIVE [HN]  1858 Will d/w gynecology to discuss possible blood transfusion in preparation for surgery in a couple of weeks or megace for bleeding. [HN]  1925 Discussed with Dr. Tomblin with OB/Gyn who recommends TXA TID x 7 days and blood transfusion. He states he would prefer a Hgb >10 for surgery. Will transfuse one unit PRBCs here and patient can f/u with hematology for recheck next week.  [HN]    Clinical Course User Index [HN] Franklyn Sid SAILOR, MD    Labs: I Ordered, and personally interpreted labs.  The pertinent results include:  those listed above  Additional history obtained from chart review.    Cardiac Monitoring: The patient was maintained on a cardiac monitor.  I personally viewed and interpreted the cardiac monitored which showed an underlying rhythm of: NSR  Reevaluation: After the interventions noted above, I reevaluated the patient and found that they have :improved  Social Determinants of Health: Lives independently  Disposition:  DC w/ discharge instructions/return precautions. All questions answered to patient's satisfaction.    Co morbidities that complicate the patient evaluation  Past Medical History:  Diagnosis Date   Allergy    Anemia    w/ blood transfusion;  Presumably from menorrhagia/menomenorrhagia; has had both blood and iron  transfusions   Anxiety    Back pain    no current problems as of 01/28/23   Bipolar 1 disorder (HCC)    With depression   Chest pain    CHF (congestive heart failure) (HCC)    Depression    Diastolic heart failure (HCC)    Dyspnea    exertion   Essential hypertension    Fibromyalgia    History of blood transfusion 08/2017   [facility]   History of hiatal hernia    IBS (irritable bowel syndrome)    no current problems as of 01/28/23   Infertility, female    Lactose intolerance    Lower  extremity edema    Migraines    Morbid obesity (HCC)    Osteoarthritis    Hands, ankles; HLA-B27 positive   PCOS (polycystic  ovarian syndrome)    treated metformin    Pneumonia    x several   Pre-diabetes    5.7 hbga!c 05/02/22   Seasonal allergies    With recurrent allergic rhinitis   Sleep apnea    uses nightly   SOB (shortness of breath)    Uterine leiomyoma    Vitamin D  deficiency      Medicines No orders of the defined types were placed in this encounter.   I have reviewed the patients home medicines and have made adjustments as needed  Problem List / ED Course: Problem List Items Addressed This Visit   None Visit Diagnoses       Acute on chronic blood loss anemia    -  Primary     Dysfunctional uterine bleeding         Generalized weakness         Dyspnea on exertion                       This note was created using dictation software, which may contain spelling or grammatical errors.    Franklyn Sid SAILOR, MD 02/06/24 [phone]

## 2024-01-28 NOTE — ED Provider Triage Note (Signed)
 Emergency Medicine Provider Triage Evaluation Note  Christine [name] , a 48 y.o. female  was evaluated in triage.  Pt complains of dizziness, HA, low hemoglobin.  Review of Systems  Positive: Increased SHOB with exertion, dizziness, Ha, currently on period Negative: Fever, CP, N/V/D  Physical Exam  BP (!) 162/72 (BP Location: Left Arm)   Pulse 75   Temp 98.6 F (37 C) (Oral)   Resp 15   Ht 5' 4 (1.626 m)   Wt (!) 147 kg   LMP 01/25/2024   SpO2 99%   BMI 55.63 kg/m  Gen:   Awake, no distress   Resp:  Normal effort  MSK:   Moves extremities without difficulty  Other:  Pale appearing  Medical Decision Making  Medically screening exam initiated at 2:41 PM.  Appropriate orders placed.  Christine [name] was informed that the remainder of the evaluation will be completed by another provider, this initial triage assessment does not replace that evaluation, and the importance of remaining in the ED until their evaluation is complete.  Labs and imaging ordered   Christine [name] Christine [name] 01/28/24 1443

## 2024-01-28 NOTE — ED Triage Notes (Signed)
 Pt arrived reporting shortness of breath and fatigue for one month. Patient is on menstrual at this time. States she has seem rheumatologist last week and was advised she has a 7.7 Hemoglobin. States wasn't bleeding at that time. Endorses progressively getting worse.

## 2024-01-29 LAB — BPAM RBC
Blood Product Expiration Date: 202507242359
ISSUE DATE / TIME: 202506252021
Unit Type and Rh: 5100

## 2024-01-29 LAB — TYPE AND SCREEN
ABO/RH(D): O POS
Antibody Screen: NEGATIVE
Unit division: 0

## 2024-02-02 ENCOUNTER — Other Ambulatory Visit: Payer: Self-pay

## 2024-02-02 DIAGNOSIS — M545 Low back pain, unspecified: Secondary | ICD-10-CM | POA: Diagnosis not present

## 2024-02-02 DIAGNOSIS — R21 Rash and other nonspecific skin eruption: Secondary | ICD-10-CM | POA: Diagnosis not present

## 2024-02-02 DIAGNOSIS — M459 Ankylosing spondylitis of unspecified sites in spine: Secondary | ICD-10-CM

## 2024-02-02 DIAGNOSIS — M7661 Achilles tendinitis, right leg: Secondary | ICD-10-CM | POA: Diagnosis not present

## 2024-02-02 DIAGNOSIS — M45A0 Non-radiographic axial spondyloarthritis of unspecified sites in spine: Secondary | ICD-10-CM | POA: Diagnosis not present

## 2024-02-02 DIAGNOSIS — M1991 Primary osteoarthritis, unspecified site: Secondary | ICD-10-CM | POA: Diagnosis not present

## 2024-02-02 DIAGNOSIS — D500 Iron deficiency anemia secondary to blood loss (chronic): Secondary | ICD-10-CM

## 2024-02-02 DIAGNOSIS — Z6841 Body Mass Index (BMI) 40.0 and over, adult: Secondary | ICD-10-CM | POA: Diagnosis not present

## 2024-02-02 DIAGNOSIS — M7662 Achilles tendinitis, left leg: Secondary | ICD-10-CM | POA: Diagnosis not present

## 2024-02-02 DIAGNOSIS — E611 Iron deficiency: Secondary | ICD-10-CM | POA: Diagnosis not present

## 2024-02-02 DIAGNOSIS — Z1589 Genetic susceptibility to other disease: Secondary | ICD-10-CM | POA: Diagnosis not present

## 2024-02-02 HISTORY — DX: Ankylosing spondylitis of unspecified sites in spine: M45.9

## 2024-02-02 NOTE — Progress Notes (Unsigned)
 [facility]   Telephone:(336) [phone] Fax:([phone]    Patient Care Team: Cristopher Bottcher, NP as PCP - General (Family Medicine) Anner Alm ORN, MD as PCP - Cardiology (Cardiology)   CHIEF COMPLAINT: Follow up IDA  CURRENT THERAPY: IV iron  as needed, previously Feraheme  now Venofer , last given 06/01/2021 and 06/09/2021; she does not tolerate oral iron    INTERVAL HISTORY Christine [name] returns for follow up, last seen by me 12/04/22  ROS   Past Medical History:  Diagnosis Date   Allergy    Anemia    w/ blood transfusion;  Presumably from menorrhagia/menomenorrhagia; has had both blood and iron  transfusions   Anxiety    Back pain    no current problems as of 01/28/23   Bipolar 1 disorder (HCC)    With depression   Chest pain    CHF (congestive heart failure) (HCC)    Depression    Diastolic heart failure (HCC)    Dyspnea    exertion   Essential hypertension    Fibromyalgia    History of blood transfusion 08/2017   [facility]   History of hiatal hernia    IBS (irritable bowel syndrome)    no current problems as of 01/28/23   Infertility, female    Lactose intolerance    Lower extremity edema    Migraines    Morbid obesity (HCC)    Osteoarthritis    Hands, ankles; HLA-B27 positive   PCOS (polycystic ovarian syndrome)    treated metformin    Pneumonia    x several   Pre-diabetes    5.7 hbga!c 05/02/22   Seasonal allergies    With recurrent allergic rhinitis   Sleep apnea    uses nightly   SOB (shortness of breath)    Uterine leiomyoma    Vitamin D  deficiency      Past Surgical History:  Procedure Laterality Date   BIOPSY  03/13/2023   Procedure: BIOPSY;  Surgeon: Charlanne Groom, MD;  Location: [facility] ENDOSCOPY;  Service: Gastroenterology;;   CARDIAC CT [facility]  03/2019    Ca Score 0. Normal Coronary origin R dominant. No evidence of CAD. ? Liver nodules - poorly visualized (consider MRI w & w/o Gad contrast)   CERVICAL POLYPECTOMY  01/15/2018    Procedure: CERVICAL POLYPECTOMY;  Surgeon: Leva Rush, MD;  Location: [facility] ORS;  Service: Gynecology;;   COLONOSCOPY  2008   Normal   COLONOSCOPY WITH PROPOFOL  N/A 03/13/2023   Procedure: COLONOSCOPY WITH PROPOFOL ;  Surgeon: Charlanne Groom, MD;  Location: [facility] ENDOSCOPY;  Service: Gastroenterology;  Laterality: N/A;   DIAGNOSTIC LAPAROSCOPY  08/1999   dermoid cyst, RSO   DILATION AND CURETTAGE OF UTERUS  05/2004   MAB   DILATION AND CURETTAGE OF UTERUS  04/2015   ESOPHAGOGASTRODUODENOSCOPY (EGD) WITH PROPOFOL  N/A 03/13/2023   Procedure: ESOPHAGOGASTRODUODENOSCOPY (EGD) WITH PROPOFOL ;  Surgeon: Charlanne Groom, MD;  Location: [facility] ENDOSCOPY;  Service: Gastroenterology;  Laterality: N/A;   HYSTEROSCOPY WITH D & C  07/22/2011   Procedure: DILATATION AND CURETTAGE /HYSTEROSCOPY;  Surgeon: Rosaline LITTIE Cobble, MD;  Location: [facility] ORS;  Service: Gynecology;;   HYSTEROSCOPY WITH D & C N/A 01/15/2018   Procedure: DILATATION AND CURETTAGE LELDON WITH MYOSURE;  Surgeon: Leva Rush, MD;  Location: [facility] ORS;  Service: Gynecology;  Laterality: N/A;   oopherectomy  Right 2001   dermoid tumor   POLYPECTOMY  03/13/2023   Procedure: POLYPECTOMY;  Surgeon: Charlanne Groom, MD;  Location: [facility] ENDOSCOPY;  Service: Gastroenterology;;  TRANSTHORACIC ECHOCARDIOGRAM  08/2017    EF 65 to 70% with vigorous wall motion.  Suggestion of high cardiac output (possibly related to anemia thyrotoxicosis, Pregnancy, sepsis etc.) -- was in setting of symptomatic anemia - Hgb 5.7   UMBILICAL HERNIA REPAIR N/A 01/30/2023   Procedure: HERNIA REPAIR UMBILICAL ADULT WITH MESH;  Surgeon: Vernetta Berg, MD;  Location[facility] OR;  Service: General;  Laterality: N/A;     Outpatient Encounter Medications as of 02/03/2024  Medication Sig Note   acetaminophen  (TYLENOL ) 500 MG tablet Take 1,000 mg by mouth every 6 (six) hours as needed.    amLODipine  (NORVASC ) 5 MG tablet Take 1 tablet (5 mg total) by mouth daily.    cetirizine (ZYRTEC) 10 MG tablet  Take 10 mg by mouth daily.    chlorthalidone  (HYGROTON ) 25 MG tablet Take 0.5 tablets (12.5 mg total) by mouth daily. Please keep scheduled appointment for future refills. Thank you.    Cholecalciferol (VITAMIN D ) 50 MCG (2000 UT) tablet Take 4,000 Units by mouth daily.    clonazePAM  (KLONOPIN ) 0.5 MG tablet Take 1 tablet (0.5 mg total) by mouth daily as needed for anxiety.    FLUoxetine (PROZAC) 10 MG capsule Take 10 mg by mouth See admin instructions. Take 10 mg daily for 7 days pre-cycle and 3 days during cycle (10 days per month)    fluticasone  (FLONASE ) 50 MCG/ACT nasal spray Place 1 spray into both nostrils daily.    lamoTRIgine  (LAMICTAL ) 200 MG tablet Take 200 mg by mouth 2 (two) times daily. 01/28/2023: Patient takes at night - 400 mg qhs   lithium  carbonate (LITHOBID ) 300 MG ER tablet Take 2 tablets (600 mg total) by mouth 2 (two) times daily.    meloxicam  (MOBIC ) 15 MG tablet Take 1 tablet (15 mg total) by mouth daily.    metFORMIN  (GLUCOPHAGE ) 500 MG tablet Take 1 tablet (500 mg total) by mouth 2 (two) times daily with a meal.    metoprolol  succinate (TOPROL -XL) 100 MG 24 hr tablet TAKE 1 TABLET BY MOUTH DAILY WITH OR IMMEDIATELY FOLLOWING A MEAL    SEROQUEL 50 MG tablet 1 tablet at bedtime Orally Once a day    tranexamic acid  (LYSTEDA ) 650 MG TABS tablet Take 2 tablets (1,300 mg total) by mouth 3 (three) times daily for 5 days.    No facility-administered encounter medications on file as of 02/03/2024.     There were no vitals filed for this visit. There is no height or weight on file to calculate BMI.   ECOG PERFORMANCE STATUS: {CHL ONC ECOG [phone]}  PHYSICAL EXAM GENERAL:alert, no distress and comfortable SKIN: no rash  EYES: sclera clear NECK: without mass LYMPH:  no palpable cervical or supraclavicular lymphadenopathy  LUNGS: clear with normal breathing effort HEART: regular rate & rhythm, no lower extremity edema ABDOMEN: abdomen soft, non-tender and normal bowel  sounds NEURO: alert & oriented x 3 with fluent speech, no focal motor/sensory deficits Breast exam:  PAC without erythema    CBC    Latest Ref Rng & Units 01/28/2024   12:42 PM 01/30/2023    2:21 PM 12/04/2022    9:24 AM  CBC  WBC 4.0 - 10.5 K/uL 7.6  9.0  8.9   Hemoglobin 12.0 - 15.0 g/dL 7.9  88.3  88.8   Hematocrit 36.0 - 46.0 % 29.0  37.8  35.7   Platelets 150 - 400 K/uL 453  346  369       CMP  Latest Ref Rng & Units 01/28/2024   12:42 PM 01/20/2024    9:13 AM 01/30/2023    2:21 PM  CMP  Glucose 70 - 99 mg/dL 91  89  95   BUN 6 - 20 mg/dL 17  7  10    Creatinine 0.44 - 1.00 mg/dL 8.87  9.10  9.11   Sodium 135 - 145 mmol/L 136  137  138   Potassium 3.5 - 5.1 mmol/L 3.9  4.1  3.3   Chloride 98 - 111 mmol/L 106  102  107   CO2 22 - 32 mmol/L 22  18  21    Calcium 8.9 - 10.3 mg/dL 9.4  9.7  9.3       ASSESSMENT & PLAN:Christine [name] is a pleasant 48 yo AAF with history of uterine fibroids, polyps, PCOS, obesity, HTN, and arthritis found to have severe iron  deficiency anemia secondary to chronic blood loss    1. Iron  deficiency anemia secondary to chronic blood loss from menorrhagia -She required frequent IV Feraheme  support in the past, from January- June 2019, then she lost to f/u -when she re-established in 02/2019, she presented with symptomatic anemia with Hg 7.8, labs revealed severe iron  deficiency with ferritin <4, serum iron  18, 4% transferrin sat  -She received monthly IV Feraheme  from 02/2019 - 04/2019, she responded well and anemia resolved until she became symptomatic again with exertional dyspnea, fatigue, lightheadedness -Most recently received IV iron  several times from 06/2022 - 09/2022, clinically, her fatigue has improved -She continues to have heavy menses, under new OB/GYN care undergoing fibroid workup, and recently started progestin to manage bleeding.  This is helping some -Scheduled for colonoscopy 5/30 to rule out GI bleeding source -S/p IV iron  Spring  2024 and she responded well, Hgb improved to 11.6 and MCV normalized in 01/2023 but she ultimately lost f/up -Presented to ED 01/28/24 with Hgb 7.9, plt 453, s/p 1 unit pRBCs and she was referred back to us    -Hysterectomy scheduled 02/19/24 with Dr. Lequita    2.  Health and wellness -Previously under care of weight loss wellness center, PCP, cardiology, and Gyn.  She now has new GYN and has reestablished with healthy weight management -Again reviewed age-appropriate cancer screenings this year.  Has never had a mammogram.   -Colonoscopy scheduled 01/02/23 with Dr. Charlanne   3. Bipolar -On aripiprazole , clonazepam , lamotrigine , lithium  -She is under mental health care and on disability for bipolar but does work some  -Father-in-law passed away 6 months ago -Also has stress from 2 teenage daughters  -Mental state worsened last year 11/2022, she engaged in self-harm and is under the close care of mental health provider, she did not require hospitalization        PLAN:  No orders of the defined types were placed in this encounter.     All questions were answered. The patient knows to call the clinic with any problems, questions or concerns. No barriers to learning were detected. I spent *** counseling the patient face to face. The total time spent in the appointment was *** and more than 50% was on counseling, review of test results, and coordination of care.   Earnie Rockhold K Keiandra Sullenger, NP 02/02/2024 4:42 PM

## 2024-02-03 ENCOUNTER — Inpatient Hospital Stay

## 2024-02-03 ENCOUNTER — Inpatient Hospital Stay: Attending: Nurse Practitioner | Admitting: Nurse Practitioner

## 2024-02-03 ENCOUNTER — Encounter: Payer: Self-pay | Admitting: Nurse Practitioner

## 2024-02-03 ENCOUNTER — Other Ambulatory Visit: Payer: Self-pay | Admitting: Nurse Practitioner

## 2024-02-03 ENCOUNTER — Other Ambulatory Visit: Payer: Self-pay

## 2024-02-03 ENCOUNTER — Other Ambulatory Visit: Payer: Self-pay | Admitting: *Deleted

## 2024-02-03 ENCOUNTER — Telehealth: Payer: Self-pay

## 2024-02-03 VITALS — BP 138/88 | HR 79 | Temp 97.9°F | Resp 17 | Wt 320.9 lb

## 2024-02-03 DIAGNOSIS — F319 Bipolar disorder, unspecified: Secondary | ICD-10-CM | POA: Diagnosis not present

## 2024-02-03 DIAGNOSIS — D259 Leiomyoma of uterus, unspecified: Secondary | ICD-10-CM | POA: Insufficient documentation

## 2024-02-03 DIAGNOSIS — D500 Iron deficiency anemia secondary to blood loss (chronic): Secondary | ICD-10-CM

## 2024-02-03 DIAGNOSIS — N920 Excessive and frequent menstruation with regular cycle: Secondary | ICD-10-CM | POA: Diagnosis not present

## 2024-02-03 LAB — CBC WITH DIFFERENTIAL (CANCER CENTER ONLY)
Abs Immature Granulocytes: 0.03 10*3/uL (ref 0.00–0.07)
Basophils Absolute: 0 10*3/uL (ref 0.0–0.1)
Basophils Relative: 0 %
Eosinophils Absolute: 0.1 10*3/uL (ref 0.0–0.5)
Eosinophils Relative: 2 %
HCT: 28.1 % — ABNORMAL LOW (ref 36.0–46.0)
Hemoglobin: 8.1 g/dL — ABNORMAL LOW (ref 12.0–15.0)
Immature Granulocytes: 0 %
Lymphocytes Relative: 21 %
Lymphs Abs: 1.4 10*3/uL (ref 0.7–4.0)
MCH: 21.3 pg — ABNORMAL LOW (ref 26.0–34.0)
MCHC: 28.8 g/dL — ABNORMAL LOW (ref 30.0–36.0)
MCV: 73.8 fL — ABNORMAL LOW (ref 80.0–100.0)
Monocytes Absolute: 0.5 10*3/uL (ref 0.1–1.0)
Monocytes Relative: 7 %
Neutro Abs: 4.7 10*3/uL (ref 1.7–7.7)
Neutrophils Relative %: 70 %
Platelet Count: 414 10*3/uL — ABNORMAL HIGH (ref 150–400)
RBC: 3.81 MIL/uL — ABNORMAL LOW (ref 3.87–5.11)
RDW: 19.3 % — ABNORMAL HIGH (ref 11.5–15.5)
WBC Count: 6.8 10*3/uL (ref 4.0–10.5)
nRBC: 0 % (ref 0.0–0.2)

## 2024-02-03 LAB — IRON AND IRON BINDING CAPACITY (CC-WL,HP ONLY)
Iron: 27 ug/dL — ABNORMAL LOW (ref 28–170)
Saturation Ratios: 6 % — ABNORMAL LOW (ref 10.4–31.8)
TIBC: 459 ug/dL — ABNORMAL HIGH (ref 250–450)
UIBC: 432 ug/dL (ref 148–442)

## 2024-02-03 LAB — SAMPLE TO BLOOD BANK

## 2024-02-03 LAB — VITAMIN B12: Vitamin B-12: 237 pg/mL (ref 180–914)

## 2024-02-03 LAB — FERRITIN: Ferritin: 7 ng/mL — ABNORMAL LOW (ref 11–307)

## 2024-02-03 NOTE — Telephone Encounter (Signed)
 [name], patient will be scheduled as soon as possible.  Auth Submission: NO AUTH NEEDED Site of care: Site of care: [facility] Payer: Medicare A/B and [place] Medicaid Medication & CPT/J Code(s) submitted: Feraheme  (ferumoxytol ) [id number] Diagnosis Code:  Route of submission (phone, fax, portal):  Phone # Fax # Auth type: Buy/Bill PB Units/visits requested: 510mg  x 2 doses Reference number:  Approval from: 02/03/24 to 06/05/24

## 2024-02-04 ENCOUNTER — Encounter: Payer: Self-pay | Admitting: Nurse Practitioner

## 2024-02-05 ENCOUNTER — Ambulatory Visit: Payer: Self-pay | Admitting: Nurse Practitioner

## 2024-02-09 DIAGNOSIS — F3189 Other bipolar disorder: Secondary | ICD-10-CM | POA: Diagnosis not present

## 2024-02-10 ENCOUNTER — Ambulatory Visit

## 2024-02-10 ENCOUNTER — Other Ambulatory Visit (HOSPITAL_COMMUNITY): Payer: Self-pay

## 2024-02-10 VITALS — BP 134/87 | HR 79 | Temp 97.8°F | Resp 20 | Ht 64.0 in | Wt 328.4 lb

## 2024-02-10 DIAGNOSIS — N920 Excessive and frequent menstruation with regular cycle: Secondary | ICD-10-CM

## 2024-02-10 DIAGNOSIS — D649 Anemia, unspecified: Secondary | ICD-10-CM

## 2024-02-10 DIAGNOSIS — D500 Iron deficiency anemia secondary to blood loss (chronic): Secondary | ICD-10-CM

## 2024-02-10 DIAGNOSIS — F3189 Other bipolar disorder: Secondary | ICD-10-CM | POA: Diagnosis not present

## 2024-02-10 MED ORDER — SODIUM CHLORIDE 0.9 % IV SOLN
510.0000 mg | Freq: Once | INTRAVENOUS | Status: AC
  Administered 2024-02-10: 510 mg via INTRAVENOUS
  Filled 2024-02-10: qty 17

## 2024-02-10 NOTE — Progress Notes (Addendum)
 Surgical Instructions    Your procedure is scheduled on Thursday, 02/19/24.  Report to [facility] Main Entrance A at 5:30 A.M., then check in with the Admitting office.  Call this number if you have problems the morning of surgery:  [phone]   If you have any questions prior to your surgery date call [phone]: Open Monday-Friday 8am-4pm If you experience any cold or flu symptoms such as cough, fever, chills, shortness of breath, etc. between now and your scheduled surgery, please notify us  at the above number     Remember:  Do not eat after midnight the night before your surgery-Wednesday  You may drink clear liquids until 4:30 AM, the morning of your surgery-Thursday.   Clear liquids allowed are: Water, Non-Citrus Juices (without pulp), Carbonated Beverages, Clear Tea, Black Coffee ONLY (NO MILK, CREAM OR POWDERED CREAMER of any kind), and Gatorade    Take these medicines the morning of surgery with A SIP OF WATER:  amLODipine  (NORVASC   cetirizine (ZYRTEC)  FLUoxetine (PROZAC)  fluticasone  (FLONASE ) Nasal Spray lithium  carbonate (LITHOBID )  metoprolol  succinate (TOPROL -XL)   IF NEEDED: acetaminophen  (TYLENOL )  clonazePAM  (KLONOPIN )    ORAL DIABETIC MEDICATION INSTRUCTIONS (Metformin )   The day before your procedure-Wed:  Take your Metformin  as you do normally  The day of your procedure-Thurs:  Do not take Metformin  on the morning of  your surgery.  We will check your blood sugar levels during the admission process and again in Recovery before discharging you home   As of today, STOP taking any Aspirin  (unless otherwise instructed by your surgeon) Aleve , Naproxen , Ibuprofen , Meloxicam , Motrin , Advil , Goody's, BC's, all herbal medications, fish oil, and all vitamins.           Do not wear jewelry or makeup. Do not wear lotions, powders, perfumes/cologne or deodorant. Do not shave 48 hours prior to surgery.   Do not bring valuables to the hospital. Do not wear  nail polish, gel polish, artificial nails, or any other type of covering on natural nails (fingers and toes) If you have artificial nails or gel coating that need to be removed by a nail salon, please have this removed prior to surgery. Artificial nails or gel coating may interfere with anesthesia's ability to adequately monitor your vital signs.  [facility] is not responsible for any belongings or valuables.    Do NOT Smoke (Tobacco/Vaping)  24 hours prior to your procedure  If you use a CPAP at night, you may bring your mask for your overnight stay.   Contacts, glasses, hearing aids, dentures or partials may not be worn into surgery, please bring cases for these belongings   For patients admitted to the hospital, discharge time will be determined by your treatment team.   Patients discharged the day of surgery will not be allowed to drive home, and someone needs to stay with them for 24 hours.   SURGICAL WAITING ROOM VISITATION Patients having surgery or a procedure may have no more than 2 support people in the waiting area - these visitors may rotate.   Children under the age of 94 must have an adult with them who is not the patient. If the patient needs to stay at the hospital during part of their recovery, the visitor guidelines for inpatient rooms apply. Pre-op nurse will coordinate an appropriate time for 1 support person to accompany patient in pre-op.  This support person may not rotate.   Please refer to [email or web address] for the visitor guidelines for Inpatients (after  your surgery is over and you are in a regular room).    Special instructions:    Oral Hygiene is also important to reduce your risk of infection.  Remember - BRUSH YOUR TEETH THE MORNING OF SURGERY WITH YOUR REGULAR TOOTHPASTE   [place]- Preparing For Surgery  Before surgery, you can play an important role. Because skin is not sterile, your skin needs  to be as free of germs as possible. You can reduce the number of germs on your skin by washing with CHG (chlorahexidine gluconate) Soap before surgery.  CHG is an antiseptic cleaner which kills germs and bonds with the skin to continue killing germs even after washing.     Please do not use if you have an allergy to CHG or antibacterial soaps. If your skin becomes reddened/irritated stop using the CHG.  Do not shave (including legs and underarms) for at least 48 hours prior to first CHG shower. It is OK to shave your face.  Please follow these instructions carefully.     Shower the [employer] SURGERY-Wed and the MORNING OF SURGERY-Thurs with CHG Soap.   If you chose to wash your hair, wash your hair first as usual with your normal shampoo. After you shampoo, rinse your hair and body thoroughly to remove the shampoo.  Then [employer] and genitals (private parts) with your normal soap and rinse thoroughly to remove soap.  After that Use CHG Soap as you would any other liquid soap. You can apply CHG directly to the skin and wash gently with a scrungie or a clean washcloth.   Apply the CHG Soap to your body ONLY FROM THE NECK DOWN.  Do not use on open wounds or open sores. Avoid contact with your eyes, ears, mouth and genitals (private parts). Wash Face and genitals (private parts)  with your normal soap.   Wash thoroughly, paying special attention to the area where your surgery will be performed.  Thoroughly rinse your body with warm water from the neck down.  DO NOT shower/wash with your normal soap after using and rinsing off the CHG Soap.  Pat yourself dry with a CLEAN TOWEL.  Wear CLEAN PAJAMAS to bed the night before surgery  Place CLEAN SHEETS on your bed the night before your surgery  DO NOT SLEEP WITH PETS.   Day of Surgery:  Take a shower with CHG soap. Wear Clean/Comfortable clothing the morning of surgery Do not apply any deodorants/lotions.   Remember to brush your teeth  WITH YOUR REGULAR TOOTHPASTE.    If you received a COVID test during your pre-op visit, it is requested that you wear a mask when out in public, stay away from anyone that may not be feeling well, and notify your surgeon if you develop symptoms. If you have been in contact with anyone that has tested positive in the last 10 days, please notify your surgeon.    Please read over the following fact sheets that you were given.

## 2024-02-10 NOTE — Progress Notes (Signed)
 PCP - Lyle Bertrand, NP  Cardiologist - Dr Alm Clay (clearance on 01/20/24) Oncology - Mayme Silversmith, NP  Chest x-ray - 01/28/24 EKG - 01/28/24 Stress Test - n/a ECHO - 01/21/24 Cardiac Cath - n/a  ICD Pacemaker/Loop - n/a  Sleep Study -  Yes (09/2019) CPAP - uses CPAP nightly  Diabetes Type 2 Fasting Blood Sugar -  Checks Blood Sugar _____ times a day  Do not take Metformin  on the morning of surgery.  If your blood sugar is less than 70 mg/dL, you will need to treat for low blood sugar: Treat a low blood sugar (less than 70 mg/dL) with  cup of clear juice (cranberry or apple), 4 glucose tablets, OR glucose gel. Recheck blood sugar in 15 minutes after treatment (to make sure it is greater than 70 mg/dL). If your blood sugar is not greater than 70 mg/dL on recheck, call [phone] for further instructions.  Aspirin  & Blood Thinner Instructions:  n/a  ERAS - clear liquids til 4:30 AM DOS.  Anesthesia review: Yes  STOP now taking any Aspirin  (unless otherwise instructed by your surgeon), Aleve , Naproxen , Ibuprofen , Motrin , Advil , Goody's, BC's, all herbal medications, fish oil, and all vitamins.   Coronavirus Screening Do you have any of the following symptoms:  Cough yes/no: No Fever (>100.52F)  yes/no: No Runny nose yes/no: No Sore throat yes/no: No Difficulty breathing/shortness of breath  yes/no: No  Have you traveled in the last 14 days and where? yes/no: No  Patient verbalized understanding of instructions that were given to them at the PAT appointment. Patient was also instructed that they will need to review over the PAT instructions again at home before surgery.

## 2024-02-10 NOTE — Progress Notes (Signed)
 Diagnosis: Iron  Deficiency Anemia  Provider:  Praveen Mannam MD  Procedure: IV Infusion  IV Type: Peripheral, IV Location: R Forearm  Feraheme  (Ferumoxytol ), Dose: 510 mg  Infusion Start Time: 1550  Infusion Stop Time: 1606  Post Infusion IV Care: Observation period completed and Peripheral IV Discontinued  Discharge: Condition: Good, Destination: Home . AVS Declined  Performed by:  Maximiano JONELLE Pouch, LPN

## 2024-02-11 ENCOUNTER — Encounter (HOSPITAL_COMMUNITY)
Admission: RE | Admit: 2024-02-11 | Discharge: 2024-02-11 | Disposition: A | Discharge status: Home / Self Care | Source: Ambulatory Visit | Attending: Obstetrics and Gynecology | Admitting: Obstetrics and Gynecology

## 2024-02-11 ENCOUNTER — Other Ambulatory Visit: Payer: Self-pay

## 2024-02-11 ENCOUNTER — Encounter (HOSPITAL_COMMUNITY): Payer: Self-pay

## 2024-02-11 VITALS — BP 141/76 | HR 75 | Temp 97.8°F | Resp 18 | Ht 64.0 in | Wt 327.0 lb

## 2024-02-11 DIAGNOSIS — G4733 Obstructive sleep apnea (adult) (pediatric): Secondary | ICD-10-CM | POA: Insufficient documentation

## 2024-02-11 DIAGNOSIS — N939 Abnormal uterine and vaginal bleeding, unspecified: Secondary | ICD-10-CM | POA: Insufficient documentation

## 2024-02-11 DIAGNOSIS — Z01812 Encounter for preprocedural laboratory examination: Secondary | ICD-10-CM | POA: Insufficient documentation

## 2024-02-11 DIAGNOSIS — I110 Hypertensive heart disease with heart failure: Secondary | ICD-10-CM | POA: Insufficient documentation

## 2024-02-11 DIAGNOSIS — Z6841 Body Mass Index (BMI) 40.0 and over, adult: Secondary | ICD-10-CM | POA: Insufficient documentation

## 2024-02-11 DIAGNOSIS — M1990 Unspecified osteoarthritis, unspecified site: Secondary | ICD-10-CM | POA: Insufficient documentation

## 2024-02-11 DIAGNOSIS — K449 Diaphragmatic hernia without obstruction or gangrene: Secondary | ICD-10-CM | POA: Insufficient documentation

## 2024-02-11 DIAGNOSIS — R7303 Prediabetes: Secondary | ICD-10-CM | POA: Insufficient documentation

## 2024-02-11 DIAGNOSIS — I5032 Chronic diastolic (congestive) heart failure: Secondary | ICD-10-CM | POA: Insufficient documentation

## 2024-02-11 DIAGNOSIS — F319 Bipolar disorder, unspecified: Secondary | ICD-10-CM | POA: Diagnosis not present

## 2024-02-11 DIAGNOSIS — Z01818 Encounter for other preprocedural examination: Secondary | ICD-10-CM

## 2024-02-11 DIAGNOSIS — M459 Ankylosing spondylitis of unspecified sites in spine: Secondary | ICD-10-CM | POA: Insufficient documentation

## 2024-02-11 DIAGNOSIS — F419 Anxiety disorder, unspecified: Secondary | ICD-10-CM | POA: Insufficient documentation

## 2024-02-11 DIAGNOSIS — M797 Fibromyalgia: Secondary | ICD-10-CM | POA: Insufficient documentation

## 2024-02-11 LAB — CBC
HCT: 28.3 % — ABNORMAL LOW (ref 36.0–46.0)
Hemoglobin: 7.8 g/dL — ABNORMAL LOW (ref 12.0–15.0)
MCH: 21 pg — ABNORMAL LOW (ref 26.0–34.0)
MCHC: 27.6 g/dL — ABNORMAL LOW (ref 30.0–36.0)
MCV: 76.3 fL — ABNORMAL LOW (ref 80.0–100.0)
Platelets: 385 K/uL (ref 150–400)
RBC: 3.71 MIL/uL — ABNORMAL LOW (ref 3.87–5.11)
RDW: 19.9 % — ABNORMAL HIGH (ref 11.5–15.5)
WBC: 8.1 K/uL (ref 4.0–10.5)
nRBC: 0.2 % (ref 0.0–0.2)

## 2024-02-11 LAB — BASIC METABOLIC PANEL WITH GFR
Anion gap: 9 (ref 5–15)
BUN: 9 mg/dL (ref 6–20)
CO2: 22 mmol/L (ref 22–32)
Calcium: 9.7 mg/dL (ref 8.9–10.3)
Chloride: 105 mmol/L (ref 98–111)
Creatinine, Ser: 0.97 mg/dL (ref 0.44–1.00)
GFR, Estimated: >60 mL/min (ref >60)
Glucose, Bld: 93 mg/dL (ref 70–99)
Potassium: 3.7 mmol/L (ref 3.5–5.1)
Sodium: 136 mmol/L (ref 135–145)

## 2024-02-11 LAB — HIV ANTIBODY (ROUTINE TESTING W REFLEX): HIV Screen 4th Generation wRfx: NONREACTIVE

## 2024-02-12 DIAGNOSIS — N939 Abnormal uterine and vaginal bleeding, unspecified: Secondary | ICD-10-CM | POA: Diagnosis not present

## 2024-02-12 LAB — RPR: RPR Ser Ql: NONREACTIVE

## 2024-02-12 NOTE — Anesthesia Preprocedure Evaluation (Addendum)
 Anesthesia Evaluation  Patient identified by MRN, date of birth, ID band Patient awake    Reviewed: Allergy & Precautions, NPO status , Patient's Chart, lab work & pertinent test results  Airway Mallampati: III  TM Distance: >3 FB Neck ROM: Full    Dental no notable dental hx. (+) Teeth Intact, Dental Advisory Given   Pulmonary shortness of breath, sleep apnea and Continuous Positive Airway Pressure Ventilation    Pulmonary exam normal breath sounds clear to auscultation       Cardiovascular hypertension, Pt. on medications and Pt. on home beta blockers pulmonary hypertension+CHF  (-) CAD and (-) Past MI Normal cardiovascular exam Rhythm:Regular Rate:Normal  01/21/2024 TTE 1. Left ventricular ejection fraction, by estimation, is 60 to 65%. Left  ventricular ejection fraction by 3D volume is 65 %. The left ventricle has  normal function. The left ventricle has no regional wall motion  abnormalities. Left ventricular diastolic   parameters were normal. The average left ventricular global longitudinal  strain is -25.0 %. The global longitudinal strain is normal.   2. Right ventricular systolic function is normal. The right ventricular  size is normal. There is moderately elevated pulmonary artery systolic  pressure. The estimated right ventricular systolic pressure is 45.2 mmHg.   3. Left atrial size was mildly dilated.   4. The mitral valve is normal in structure. Trivial mitral valve  regurgitation. No evidence of mitral stenosis.   5. The aortic valve has an indeterminant number of cusps. Aortic valve  regurgitation is trivial.   6. The inferior vena cava is normal in size with <50% respiratory  variability, suggesting right atrial pressure of 8 mmHg.      Neuro/Psych  Headaches PSYCHIATRIC DISORDERS Anxiety Depression Bipolar Disorder    Neuromuscular disease    GI/Hepatic Neg liver ROS,,,  Endo/Other    Class 4 obesity   Renal/GU negative Renal ROSLab Results      Component                Value               Date                         K                        3.7                 02/11/2024                CREATININE               0.97                02/11/2024                GFRNONAA                 >60                 02/11/2024                    GLUCOSE                  93                  02/11/2024              uterineleiyomyomata    Musculoskeletal  (+)  Arthritis ,  Fibromyalgia -  Abdominal  (+) + obese  Peds  Hematology  (+) Blood dyscrasia, anemia Lab Results      Component                Value               Date                      WBC                      8.1                 02/11/2024                HGB                      7.8 (L)             02/11/2024                HCT                      28.3 (L)            02/11/2024                MCV                      76.3 (L)            02/11/2024                PLT                      385                 02/11/2024              Anesthesia Other Findings All: PCN  Reproductive/Obstetrics                              Anesthesia Physical Anesthesia Plan  ASA: 4  Anesthesia Plan: General   Post-op Pain Management: Ketamine  IV*, Ofirmev  IV (intra-op)* and Toradol  IV (intra-op)*   Induction: Intravenous  PONV Risk Score and Plan: Midazolam , Treatment may vary due to age or medical condition, Ondansetron  and Propofol  infusion  Airway Management Planned: Oral ETT and Video Laryngoscope Planned  Additional Equipment: None  Intra-op Plan:   Post-operative Plan: Extubation in OR  Informed Consent: I have reviewed the patients History and Physical, chart, labs and discussed the procedure including the risks, benefits and alternatives for the proposed anesthesia with the patient or authorized representative who has indicated his/her understanding and acceptance.     Dental advisory given  Plan  Discussed with: CRNA and Surgeon  Anesthesia Plan Comments: (PAT note written by Kaizen Ibsen, PA-C. Per Dr. Lequita, he has ordered 1 unit PRBC to begin following arrival to [facility] day of surgery.   )         Anesthesia Quick Evaluation

## 2024-02-12 NOTE — Progress Notes (Addendum)
 Anesthesia Chart Review:  Case: [id number] Date/Time: 02/19/24 0715   Procedure: HYSTERECTOMY, TOTAL, ABDOMINAL, WITH SALPINGECTOMY (Left)   Anesthesia type: General   Diagnosis: Uterine bleeding [N93.9]   Pre-op diagnosis: ABNORMAL UTERINE BLEEDING   [facility]: [facility] ROOM 08 / [facility]   Surgeons: Christine Evalene LABOR, MD       DISCUSSION: Christine [name] is a 48 year old female who is scheduled for the above procedure.   Past medical history significant for never smoker, HTN, chronic diastolic CHF, pulmnary HTN (RVSP 45.2 mmHg 01/21/24), OSA (severe with AHI 61.9 03/2019; uses CPAP), chronic DOE, prediabetes, IDA (receives iron  infusions), anxiety, depression, fibromyalgia, bipolar disorder, hiatal hernia, PCOS, osteoarthritis, ankylosing spondylitis (HLA-B27 +), uterine leiomyoma, hernia (s/p repair of chronically incarnated umbilical hernia 01/30/23), morbid obesity.    Patient saw cardiology on 01/20/24 for preoperative risk assessment. Coronary CTA in 03/2019 for evaluation of atypical chest pain showed coronary calcium score of 0 with no evidence of CAD and dilated pulmonary artery at 37mm which could suggest pulmonary hypertension. TR signal on TTE was inadequate for assessing PASP, and with normal RV and RA decision made to defer RHC at that time. Pulmonary hypertension thought secondary to obesity, obesity-hypoventilation syndrome, OSA. Per Goodrich, [name], PA-C, Patient has upcoming hysterectomy planned. She does describe some dyspnea on exertion over the last 2 months but no angina or any other CHF symptoms. She is able to complete >4.0 METs of physical activity without any chest pain. Per Revised Cardiac Risk Index score = 2 (intraperitoneal surgery and CHF) indicating at 10.1% risk of major cardiac event.  Low suspicion that dyspnea is cardiac in nature but will check BNP and update Echo given she has not had an Echo since 2021 and has a murmur on exam. If Echo shows normal LV function/ wall motion  and no significant valvular disease, will be okay to proceed with surgery. BNP came back normal, and 01/21/24 TTE showed LVEF of 60-65% with normal wall motion and diastolic function, normal RV size and function with moderately elevated PASP, RVSP 45.2 mmHg, and no significant valvular disease. APP felt patient was OK to proceed with planned surgery from a cardiac standpoint. Dyspnea was thought to be related to worsening anemia in the high 7 range, and deferred treatment to GYN and HEM.   She had HEM follow-up with Christine [name], [name], NP on 02/03/24 for IDA. She did not tolerate oral iron  in the past. S/p Feraheme  or Venofer  periodically since 2019 with improvement. HGB 7.9 on 01/28/24, s/p 1 unit PRBC and transferred back to HEM. Menorrhagia for fibroids thought to be the source of her IDA. NP noted plans for hysterectomy on 02/19/24. HGB 8.1, so plan for Replace iron  with IV Venofer  200 mg x5 prior to hysterectomy and B12 (if low) She was started on oral B12 [phone] mcg daily on 02/05/24. Because her insurance preferred Feraheme , only 2 doses planned before surgery--the first was on 02/10/24 and has another appointment on 02/17/24. NP deferred to GYN decision regarding potential for perioperative transfusion. Follow-up after surgery planned.   H/H 7.8/28.3 on 02/11/24 (previously 8.1/28.1 on 02/03/24 and 7.9/29.0 on 01/28/24). I spoke with Christine [name]. She says Dr. Lequita is aware and saw patient on 02/12/24. Plan is for out-patient transfusion prior to surgery. Will leave chart for follow-up infusions and labs.   ADDENDUM 02/17/24 6:07 PM: Dr. Lequita requested that patient receive 1 unit PRBC on the day of surgery (preop or intra-op). Her T&S would need to be within 72 hours  of surgery, so she had this redrawn today. She also received Feraheme  infusion this afternoon. H/H were not repeated there. I communicated with anesthesiologist Christine Senior, MD and RN Flow Coordinator Christine [name]. Patient is a first case, so she will have  to arrive at 5:30 AM with anticipated plan to start PRBC prior to or at onset of surgery. Dr. Lequita says this was discussed with patient, and he has entered orders. She is for urine pregnancy test on arrival. Anesthesia team to evaluate on the day of surgery.    VS: BP (!) 141/76   Pulse 75   Temp 36.6 C   Resp 18   Ht 5' 4 (1.626 m)   Wt (!) 148.3 kg   LMP 01/26/2024 (Exact Date)   SpO2 97%   BMI 56.13 kg/m    PROVIDERS: Christine Bottcher, NP is PCP Christine Lenis, MD is cardiologist Christine Lye, MD is GYN Christine Slough, MD is rheumatologist   LABS: Preoperative labs noted. See DISCUSSION. (all labs ordered are listed, but only abnormal results are displayed)  Labs Reviewed  CBC - Abnormal; Notable for the following components:      Result Value   RBC 3.71 (*)    Hemoglobin 7.8 (*)    HCT 28.3 (*)    MCV 76.3 (*)    MCH 21.0 (*)    MCHC 27.6 (*)    RDW 19.9 (*)    All other components within normal limits  RPR  HIV ANTIBODY (ROUTINE TESTING W REFLEX)  BASIC METABOLIC PANEL WITH GFR  TYPE AND SCREEN    Sleep Study 03/22/19: Summary & Diagnosis:    Severe, presumed obstructive sleep apnea at AHI of 61.9/h. A  minimum of 1/3 of all apneas is central in origin. There was no  breakdown into REM and NREM AHI and of RDI. The heart rate was  notably tachy-bradycardic.    IMAGES: CXR 01/28/24: FINDINGS: Prominent central vascular markings are stable. No overt pulmonary edema. No focal airspace disease. Heart size is upper limits of normal but stable. The trachea is midline. No pleural effusions. No acute bone abnormality. IMPRESSION: No acute cardiopulmonary disease.   EKG: 01/28/24: NSR   CV: Echo 01/21/24: IMPRESSION:  1. Left ventricular ejection fraction, by estimation, is 60 to 65%. Left  ventricular ejection fraction by 3D volume is 65 %. The left ventricle has  normal function. The left ventricle has no regional wall motion  abnormalities. Left  ventricular diastolic   parameters were normal. The average left ventricular global longitudinal  strain is -25.0 %. The global longitudinal strain is normal.   2. Right ventricular systolic function is normal. The right ventricular  size is normal. There is moderately elevated pulmonary artery systolic  pressure. The estimated right ventricular systolic pressure is 45.2 mmHg.   3. Left atrial size was mildly dilated.   4. The mitral valve is normal in structure. Trivial mitral valve  regurgitation. No evidence of mitral stenosis.   5. The aortic valve has an indeterminant number of cusps. Aortic valve  regurgitation is trivial.   6. The inferior vena cava is normal in size with <50% respiratory  variability, suggesting right atrial pressure of 8 mmHg.     CTA Coronary 04/05/19:  IMPRESSION: 1. No evidence of CAD, CADRADS = 0. 2. Coronary calcium score of 0. This was 0 percentile for age and sex matched control. 3. Normal coronary origin with right dominance. 4. Dilated PA at 37 mm which could suggest pulmonary hypertension.  Past Medical History:  Diagnosis Date   Allergy    Anemia    w/ blood transfusion;  Presumably from menorrhagia/menomenorrhagia; has had both blood and iron  transfusions   Ankylosing spondylitis (HCC) 02/02/2024   recent dx in 01/2024   Anxiety    Back pain    no current problems as of 01/28/23   Bipolar 1 disorder (HCC)    With depression   Chest pain    CHF (congestive heart failure) (HCC)    Depression    Diastolic heart failure (HCC)    Dyspnea    exertion   Essential hypertension    Fibromyalgia    History of blood transfusion 08/2017   [facility]   History of hiatal hernia    IBS (irritable bowel syndrome)    no current problems as of 01/28/23   Infertility, female    Lactose intolerance    Lower extremity edema    hx   Migraines    Morbid obesity (HCC)    Osteoarthritis    Hands, ankles; HLA-B27 positive   PCOS (polycystic ovarian syndrome)     treated metformin    Pneumonia    x several   Pre-diabetes    5.7 hbga!c 05/02/22, no meds - takes metformin  for weight loss per pt on 02/11/24   Seasonal allergies    With recurrent allergic rhinitis   Sleep apnea    uses nightly   SOB (shortness of breath)    Uterine leiomyoma    Vitamin D  deficiency     Past Surgical History:  Procedure Laterality Date   BIOPSY  03/13/2023   Procedure: BIOPSY;  Surgeon: Charlanne Groom, MD;  Location: [facility] ENDOSCOPY;  Service: Gastroenterology;;   CARDIAC CT [facility]  03/2019    Ca Score 0. Normal Coronary origin R dominant. No evidence of CAD. ? Liver nodules - poorly visualized (consider MRI w & w/o Gad contrast)   CERVICAL POLYPECTOMY  01/15/2018   Procedure: CERVICAL POLYPECTOMY;  Surgeon: Leva Rush, MD;  Location: [facility] ORS;  Service: Gynecology;;   COLONOSCOPY  2008   Normal   COLONOSCOPY WITH PROPOFOL  N/A 03/13/2023   Procedure: COLONOSCOPY WITH PROPOFOL ;  Surgeon: Charlanne Groom, MD;  Location: [facility] ENDOSCOPY;  Service: Gastroenterology;  Laterality: N/A;   DIAGNOSTIC LAPAROSCOPY  08/1999   dermoid cyst, RSO   DILATION AND CURETTAGE OF UTERUS  05/2004   MAB   DILATION AND CURETTAGE OF UTERUS  04/2015   ESOPHAGOGASTRODUODENOSCOPY (EGD) WITH PROPOFOL  N/A 03/13/2023   Procedure: ESOPHAGOGASTRODUODENOSCOPY (EGD) WITH PROPOFOL ;  Surgeon: Charlanne Groom, MD;  Location: [facility] ENDOSCOPY;  Service: Gastroenterology;  Laterality: N/A;   HYSTEROSCOPY WITH D & C  07/22/2011   Procedure: DILATATION AND CURETTAGE /HYSTEROSCOPY;  Surgeon: Rosaline LITTIE Cobble, MD;  Location: [facility] ORS;  Service: Gynecology;;   HYSTEROSCOPY WITH D & C N/A 01/15/2018   Procedure: DILATATION AND CURETTAGE LELDON WITH MYOSURE;  Surgeon: Leva Rush, MD;  Location: [facility] ORS;  Service: Gynecology;  Laterality: N/A;   oopherectomy  Right 2001   dermoid tumor   POLYPECTOMY  03/13/2023   Procedure: POLYPECTOMY;  Surgeon: Charlanne Groom, MD;  Location: [facility] ENDOSCOPY;  Service: Gastroenterology;;    TRANSTHORACIC ECHOCARDIOGRAM  08/2017    EF 65 to 70% with vigorous wall motion.  Suggestion of high cardiac output (possibly related to anemia thyrotoxicosis, Pregnancy, sepsis etc.) -- was in setting of symptomatic anemia - Hgb 5.7   UMBILICAL HERNIA REPAIR N/A 01/30/2023   Procedure: HERNIA REPAIR UMBILICAL ADULT WITH MESH;  Surgeon:  Vernetta Berg, MD;  Location: [facility] OR;  Service: General;  Laterality: N/A;    MEDICATIONS:  acetaminophen  (TYLENOL ) 500 MG tablet   amLODipine  (NORVASC ) 5 MG tablet   cetirizine (ZYRTEC) 10 MG tablet   chlorthalidone  (HYGROTON ) 25 MG tablet   Cholecalciferol (VITAMIN D -3) 125 MCG (5000 UT) TABS   clonazePAM  (KLONOPIN ) 0.5 MG tablet   diclofenac (VOLTAREN) 75 MG EC tablet   FLUoxetine  (PROZAC ) 10 MG capsule   lamoTRIgine  (LAMICTAL ) 200 MG tablet   lithium  carbonate (LITHOBID ) 300 MG ER tablet   metFORMIN  (GLUCOPHAGE ) 500 MG tablet   metoprolol  succinate (TOPROL -XL) 100 MG 24 hr tablet   SEROQUEL  50 MG tablet   No current facility-administered medications for this encounter.    Christine Ruder, PA-C Surgical Short Stay/Anesthesiology [facility] Phone [phone] [facility] Phone [phone] 02/12/2024 2:26 PM

## 2024-02-16 LAB — TYPE AND SCREEN
ABO/RH(D): O POS
Antibody Screen: NEGATIVE

## 2024-02-17 ENCOUNTER — Encounter (HOSPITAL_COMMUNITY)
Admission: RE | Admit: 2024-02-17 | Discharge: 2024-02-17 | Disposition: A | Discharge status: Home / Self Care | Source: Ambulatory Visit | Attending: Obstetrics and Gynecology | Admitting: Obstetrics and Gynecology

## 2024-02-17 ENCOUNTER — Ambulatory Visit

## 2024-02-17 VITALS — BP 125/84 | HR 79 | Temp 98.6°F | Resp 20 | Ht 64.0 in | Wt 326.2 lb

## 2024-02-17 DIAGNOSIS — N9489 Other specified conditions associated with female genital organs and menstrual cycle: Secondary | ICD-10-CM | POA: Diagnosis not present

## 2024-02-17 DIAGNOSIS — N879 Dysplasia of cervix uteri, unspecified: Secondary | ICD-10-CM | POA: Diagnosis not present

## 2024-02-17 DIAGNOSIS — D500 Iron deficiency anemia secondary to blood loss (chronic): Secondary | ICD-10-CM | POA: Diagnosis not present

## 2024-02-17 DIAGNOSIS — N736 Female pelvic peritoneal adhesions (postinfective): Secondary | ICD-10-CM | POA: Diagnosis not present

## 2024-02-17 DIAGNOSIS — D259 Leiomyoma of uterus, unspecified: Secondary | ICD-10-CM | POA: Diagnosis not present

## 2024-02-17 DIAGNOSIS — M797 Fibromyalgia: Secondary | ICD-10-CM | POA: Diagnosis present

## 2024-02-17 DIAGNOSIS — I5033 Acute on chronic diastolic (congestive) heart failure: Secondary | ICD-10-CM | POA: Diagnosis not present

## 2024-02-17 DIAGNOSIS — I5032 Chronic diastolic (congestive) heart failure: Secondary | ICD-10-CM | POA: Diagnosis present

## 2024-02-17 DIAGNOSIS — N8302 Follicular cyst of left ovary: Secondary | ICD-10-CM | POA: Diagnosis not present

## 2024-02-17 DIAGNOSIS — G4733 Obstructive sleep apnea (adult) (pediatric): Secondary | ICD-10-CM | POA: Diagnosis present

## 2024-02-17 DIAGNOSIS — I110 Hypertensive heart disease with heart failure: Secondary | ICD-10-CM | POA: Diagnosis not present

## 2024-02-17 DIAGNOSIS — N920 Excessive and frequent menstruation with regular cycle: Secondary | ICD-10-CM | POA: Diagnosis not present

## 2024-02-17 DIAGNOSIS — D649 Anemia, unspecified: Secondary | ICD-10-CM

## 2024-02-17 DIAGNOSIS — N83202 Unspecified ovarian cyst, left side: Secondary | ICD-10-CM | POA: Diagnosis not present

## 2024-02-17 DIAGNOSIS — Z01818 Encounter for other preprocedural examination: Secondary | ICD-10-CM

## 2024-02-17 DIAGNOSIS — Z8719 Personal history of other diseases of the digestive system: Secondary | ICD-10-CM | POA: Diagnosis not present

## 2024-02-17 DIAGNOSIS — Z01812 Encounter for preprocedural laboratory examination: Secondary | ICD-10-CM | POA: Insufficient documentation

## 2024-02-17 DIAGNOSIS — N939 Abnormal uterine and vaginal bleeding, unspecified: Secondary | ICD-10-CM | POA: Diagnosis not present

## 2024-02-17 DIAGNOSIS — D62 Acute posthemorrhagic anemia: Secondary | ICD-10-CM | POA: Diagnosis present

## 2024-02-17 DIAGNOSIS — N888 Other specified noninflammatory disorders of cervix uteri: Secondary | ICD-10-CM | POA: Diagnosis not present

## 2024-02-17 DIAGNOSIS — Z833 Family history of diabetes mellitus: Secondary | ICD-10-CM | POA: Diagnosis not present

## 2024-02-17 DIAGNOSIS — E739 Lactose intolerance, unspecified: Secondary | ICD-10-CM | POA: Diagnosis present

## 2024-02-17 DIAGNOSIS — F3189 Other bipolar disorder: Secondary | ICD-10-CM | POA: Diagnosis not present

## 2024-02-17 DIAGNOSIS — Z8249 Family history of ischemic heart disease and other diseases of the circulatory system: Secondary | ICD-10-CM | POA: Diagnosis not present

## 2024-02-17 DIAGNOSIS — K589 Irritable bowel syndrome without diarrhea: Secondary | ICD-10-CM | POA: Diagnosis present

## 2024-02-17 MED ORDER — SODIUM CHLORIDE 0.9 % IV SOLN
510.0000 mg | Freq: Once | INTRAVENOUS | Status: AC
  Administered 2024-02-17: 510 mg via INTRAVENOUS
  Filled 2024-02-17: qty 17

## 2024-02-17 NOTE — Progress Notes (Signed)
 Diagnosis: Iron  Deficiency Anemia  Provider:  Praveen Mannam MD  Procedure: IV Infusion  IV Type: Peripheral, IV Location: L Antecubital  Feraheme  (Ferumoxytol ), Dose: 510 mg  Infusion Start Time: 1533  Infusion Stop Time: 1550  Post Infusion IV Care: Patient declined observation and Peripheral IV Discontinued  Discharge: Condition: Good, Destination: Home . AVS Declined  Performed by:  Leita FORBES Miles, LPN

## 2024-02-18 ENCOUNTER — Other Ambulatory Visit (HOSPITAL_COMMUNITY)

## 2024-02-18 NOTE — H&P (Addendum)
 Gynecology History and Physical Pre-Procedure H&P  Christine [name] is a 48 y.o. female G4P0 presenting for total abdominal hysterectomy, unilateral salpingectomy for abnormal uterine bleeding due to uterine fibroids.   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   She has not had adequate response to hormonal therapy.  She has a relevant surgical history of hysteroscopy, removal of endometrial polyp and submucosal fibroid, laparoscopic RSO for dermoid tumor, and most recently umbilical hernia repair with mesh.   Relevant medical history includes bipolar 1 with depression, obesity, OSA, T2DM on metformin , arthritis.  She has done steroid courses, but not chronically. She underwent preoperative clearance with cardiology for history of CHF with preserved EF.  Echocardiogram on 6/18 was reassuring and she was cleared for surgery.   In our office, preoperative endometrial biopsy was completed and pathology was without hyperplasia or malignancy.  A cervical polyp was noted and removed and a likely cervical fibroid was seen.    OB History     Gravida  4   Para  0   Term  0   Preterm  0   AB  4   Living         SAB  4   IAB  0   Ectopic  0   Multiple      Live Births             Past Medical History:  Diagnosis Date   Allergy    Anemia    w/ blood transfusion;  Presumably from menorrhagia/menomenorrhagia; has had both blood and iron  transfusions   Ankylosing spondylitis (HCC) 02/02/2024   recent dx in 01/2024   Anxiety    Back pain    no current problems as of 01/28/23   Bipolar 1 disorder (HCC)    With depression   Chest pain    CHF (congestive heart failure) (HCC)    Depression    Diastolic heart failure (HCC)    Dyspnea    exertion   Essential hypertension    Fibromyalgia    History of blood transfusion 08/2017   [facility]   History of hiatal hernia    IBS (irritable bowel syndrome)    no current  problems as of 01/28/23   Infertility, female    Lactose intolerance    Lower extremity edema    hx   Migraines    Morbid obesity (HCC)    Osteoarthritis    Hands, ankles; HLA-B27 positive   PCOS (polycystic ovarian syndrome)    treated metformin    Pneumonia    x several   Pre-diabetes    5.7 hbga!c 05/02/22, no meds - takes metformin  for weight loss per pt on 02/11/24   Seasonal allergies    With recurrent allergic rhinitis   Sleep apnea    uses nightly   SOB (shortness of breath)    Uterine leiomyoma    Vitamin D  deficiency    Past Surgical History:  Procedure Laterality Date   BIOPSY  03/13/2023   Procedure: BIOPSY;  Surgeon: Charlanne Groom, MD;  Location: [facility] ENDOSCOPY;  Service: Gastroenterology;;   CARDIAC CT [facility]  03/2019    Ca Score 0. Normal Coronary origin R dominant. No evidence of CAD. ? Liver nodules - poorly visualized (consider MRI w & w/o Gad contrast)   CERVICAL POLYPECTOMY  01/15/2018   Procedure: CERVICAL POLYPECTOMY;  Surgeon: Leva Rush, MD;  Location: [facility]S;  Service: Gynecology;;  COLONOSCOPY  2008   Normal   COLONOSCOPY WITH PROPOFOL  N/A 03/13/2023   Procedure: COLONOSCOPY WITH PROPOFOL ;  Surgeon: Charlanne Groom, MD;  Location: [facility] ENDOSCOPY;  Service: Gastroenterology;  Laterality: N/A;   DIAGNOSTIC LAPAROSCOPY  08/1999   dermoid cyst, RSO   DILATION AND CURETTAGE OF UTERUS  05/2004   MAB   DILATION AND CURETTAGE OF UTERUS  04/2015   ESOPHAGOGASTRODUODENOSCOPY (EGD) WITH PROPOFOL  N/A 03/13/2023   Procedure: ESOPHAGOGASTRODUODENOSCOPY (EGD) WITH PROPOFOL ;  Surgeon: Charlanne Groom, MD;  Location: [facility] ENDOSCOPY;  Service: Gastroenterology;  Laterality: N/A;   HYSTEROSCOPY WITH D & C  07/22/2011   Procedure: DILATATION AND CURETTAGE /HYSTEROSCOPY;  Surgeon: Rosaline LITTIE Cobble, MD;  Location: [facility]S;  Service: Gynecology;;   HYSTEROSCOPY WITH D & C N/A 01/15/2018   Procedure: DILATATION AND CURETTAGE LELDON WITH MYOSURE;  Surgeon: Leva Rush, MD;   Location: [facility] ORS;  Service: Gynecology;  Laterality: N/A;   oopherectomy  Right 2001   dermoid tumor   POLYPECTOMY  03/13/2023   Procedure: POLYPECTOMY;  Surgeon: Charlanne Groom, MD;  Location: [facility] ENDOSCOPY;  Service: Gastroenterology;;   TRANSTHORACIC ECHOCARDIOGRAM  08/2017    EF 65 to 70% with vigorous wall motion.  Suggestion of high cardiac output (possibly related to anemia thyrotoxicosis, Pregnancy, sepsis etc.) -- was in setting of symptomatic anemia - Hgb 5.7   UMBILICAL HERNIA REPAIR N/A 01/30/2023   Procedure: HERNIA REPAIR UMBILICAL ADULT WITH MESH;  Surgeon: Vernetta Berg, MD;  Location: [facility] OR;  Service: General;  Laterality: N/A;   Family History: family history includes Anxiety disorder in her mother; Cancer (age of onset: 66) in her brother; Colon cancer in her father; Depression in her mother; Diabetes in her father; Heart disease in her father; High blood pressure in her brother, father, and mother; Hyperlipidemia in her father and mother; Hypertension in her father and mother; Kidney disease in her father; Obesity in her father; Pancreatic cancer in her mother; Sleep apnea in her father; Stroke in her father. Social History:  reports that she has never smoked. She has never used smokeless tobacco. She reports that she does not currently use alcohol. She reports that she does not use drugs.   Review of Systems - Patient denies fever, chills, SOB, CP, N/V/D.  History   Last menstrual period 01/26/2024. Exam Physical Exam   Gen: alert, well appearing, no distress Chest: nonlabored breathing CV: no peripheral edema Abdomen: soft, nontender Ext: no evidence of DVT  Pelvic US  10/21/23: TRANSABDOMINAL PELVIC ULTRASOUND PERFORMED DUE TO ENLARGED FIBROID UTERUS BEST MEASUREMENTS OBTAINED DUE TO ENLARGED FIBROID UTERUS MULTIPLE SUBSEROSAL AND INTRAMURAL FIBROIDS SEEN- BEST MEASUREMENTS OBTAINED- LARGEST ONES MEASURE 10.2CM, 10.1CM, 8.0CM, 6.0CM, 5.6CM, 4.3CM, &  3.0CM ENDOMETRIAL THICKNESS MEASURES 9.3MM RT OVARY HAS BEEN SURGICALLY REMOVED- NO RT ADNEXAL MASSES SEEN LT OV- 3.8 X 3.0 CM SIMPLE APPEARING CYST WITHOUT BLOOD FLOW SEEN---BLOOD FLOW SEEN WITHIN OVARY NO FREE FLUID SEEN TECHNICALLY DIFFICULT SCAN DUE TO PT OBESITY AND FIBROIDS    Assessment/Plan: Admit for planned procedure.  Most recent HGB 7.8.  Patient underwent T&S on 7/15 with plans for 1u pRBC transfusion on day of surgery. Will be vigilant regarding blood loss.  Risks of procedure reviewed, which include but are not limited to bleeding, infection, pain, damage to nearby organs (including bowel, bladder, ureter), and need for blood transfusion.  Interval H&P to follow, will proceed as able.   Evalene DELENA Smiles 02/18/2024, 8:17 AM

## 2024-02-19 ENCOUNTER — Inpatient Hospital Stay (HOSPITAL_COMMUNITY)
Admission: RE | Admit: 2024-02-19 | Discharge: 2024-02-20 | DRG: 742 | Disposition: A | Discharge status: Home / Self Care | Attending: Obstetrics and Gynecology | Admitting: Obstetrics and Gynecology

## 2024-02-19 ENCOUNTER — Other Ambulatory Visit: Payer: Self-pay

## 2024-02-19 ENCOUNTER — Encounter (HOSPITAL_COMMUNITY)
Admission: RE | Disposition: A | Discharge status: Home / Self Care | Payer: Self-pay | Source: Home / Self Care | Attending: Obstetrics and Gynecology

## 2024-02-19 ENCOUNTER — Inpatient Hospital Stay (HOSPITAL_COMMUNITY): Payer: Self-pay | Admitting: Vascular Surgery

## 2024-02-19 ENCOUNTER — Encounter (HOSPITAL_COMMUNITY): Payer: Self-pay | Admitting: Obstetrics and Gynecology

## 2024-02-19 DIAGNOSIS — Z8249 Family history of ischemic heart disease and other diseases of the circulatory system: Secondary | ICD-10-CM | POA: Diagnosis not present

## 2024-02-19 DIAGNOSIS — Z833 Family history of diabetes mellitus: Secondary | ICD-10-CM

## 2024-02-19 DIAGNOSIS — N83202 Unspecified ovarian cyst, left side: Secondary | ICD-10-CM | POA: Diagnosis not present

## 2024-02-19 DIAGNOSIS — Z8719 Personal history of other diseases of the digestive system: Secondary | ICD-10-CM

## 2024-02-19 DIAGNOSIS — K589 Irritable bowel syndrome without diarrhea: Secondary | ICD-10-CM | POA: Diagnosis present

## 2024-02-19 DIAGNOSIS — D259 Leiomyoma of uterus, unspecified: Secondary | ICD-10-CM | POA: Diagnosis not present

## 2024-02-19 DIAGNOSIS — I5033 Acute on chronic diastolic (congestive) heart failure: Secondary | ICD-10-CM | POA: Diagnosis not present

## 2024-02-19 DIAGNOSIS — N736 Female pelvic peritoneal adhesions (postinfective): Secondary | ICD-10-CM | POA: Diagnosis present

## 2024-02-19 DIAGNOSIS — N888 Other specified noninflammatory disorders of cervix uteri: Secondary | ICD-10-CM | POA: Diagnosis not present

## 2024-02-19 DIAGNOSIS — N939 Abnormal uterine and vaginal bleeding, unspecified: Principal | ICD-10-CM | POA: Diagnosis present

## 2024-02-19 DIAGNOSIS — I5032 Chronic diastolic (congestive) heart failure: Secondary | ICD-10-CM | POA: Diagnosis present

## 2024-02-19 DIAGNOSIS — I110 Hypertensive heart disease with heart failure: Secondary | ICD-10-CM | POA: Diagnosis present

## 2024-02-19 DIAGNOSIS — M797 Fibromyalgia: Secondary | ICD-10-CM | POA: Diagnosis present

## 2024-02-19 DIAGNOSIS — N8302 Follicular cyst of left ovary: Secondary | ICD-10-CM | POA: Diagnosis not present

## 2024-02-19 DIAGNOSIS — D62 Acute posthemorrhagic anemia: Secondary | ICD-10-CM | POA: Diagnosis present

## 2024-02-19 DIAGNOSIS — E739 Lactose intolerance, unspecified: Secondary | ICD-10-CM | POA: Diagnosis present

## 2024-02-19 DIAGNOSIS — N879 Dysplasia of cervix uteri, unspecified: Secondary | ICD-10-CM | POA: Diagnosis not present

## 2024-02-19 DIAGNOSIS — G4733 Obstructive sleep apnea (adult) (pediatric): Secondary | ICD-10-CM | POA: Diagnosis present

## 2024-02-19 DIAGNOSIS — N9489 Other specified conditions associated with female genital organs and menstrual cycle: Secondary | ICD-10-CM | POA: Diagnosis not present

## 2024-02-19 HISTORY — PX: HYSTERECTOMY ABDOMINAL WITH SALPINGECTOMY: SHX6725

## 2024-02-19 LAB — CBC
HCT: 33.5 % — ABNORMAL LOW (ref 36.0–46.0)
Hemoglobin: 10 g/dL — ABNORMAL LOW (ref 12.0–15.0)
MCH: 24.3 pg — ABNORMAL LOW (ref 26.0–34.0)
MCHC: 29.9 g/dL — ABNORMAL LOW (ref 30.0–36.0)
MCV: 81.3 fL (ref 80.0–100.0)
Platelets: 259 10*3/uL (ref 150–400)
RBC: 4.12 MIL/uL (ref 3.87–5.11)
RDW: 24 % — ABNORMAL HIGH (ref 11.5–15.5)
WBC: 15.6 10*3/uL — ABNORMAL HIGH (ref 4.0–10.5)
nRBC: 0 % (ref 0.0–0.2)

## 2024-02-19 LAB — GLUCOSE, CAPILLARY: Glucose-Capillary: 96 mg/dL (ref 70–99)

## 2024-02-19 LAB — POCT PREGNANCY, URINE: Preg Test, Ur: NEGATIVE

## 2024-02-19 LAB — PREPARE RBC (CROSSMATCH)

## 2024-02-19 SURGERY — HYSTERECTOMY, TOTAL, ABDOMINAL, WITH SALPINGECTOMY
Anesthesia: General | Laterality: Left

## 2024-02-19 MED ORDER — FENTANYL CITRATE (PF) 250 MCG/5ML IJ SOLN
INTRAMUSCULAR | Status: AC
  Filled 2024-02-19: qty 5

## 2024-02-19 MED ORDER — SODIUM CHLORIDE (PF) 0.9 % IJ SOLN
INTRAMUSCULAR | Status: AC
Start: 2024-02-19 — End: 2024-02-19
  Filled 2024-02-19: qty 10

## 2024-02-19 MED ORDER — PROPOFOL 10 MG/ML IV BOLUS
INTRAVENOUS | Status: DC | PRN
  Administered 2024-02-19: 150 mg via INTRAVENOUS

## 2024-02-19 MED ORDER — KETOROLAC TROMETHAMINE 30 MG/ML IJ SOLN
30.0000 mg | Freq: Once | INTRAMUSCULAR | Status: AC | PRN
  Administered 2024-02-19: 30 mg via INTRAVENOUS

## 2024-02-19 MED ORDER — KETAMINE HCL 50 MG/5ML IJ SOSY
PREFILLED_SYRINGE | INTRAMUSCULAR | Status: AC
  Filled 2024-02-19: qty 5

## 2024-02-19 MED ORDER — MIDAZOLAM HCL 2 MG/2ML IJ SOLN
INTRAMUSCULAR | Status: AC
Start: 2024-02-19 — End: 2024-02-19
  Filled 2024-02-19: qty 2

## 2024-02-19 MED ORDER — LITHIUM CARBONATE ER 300 MG PO TBCR
600.0000 mg | EXTENDED_RELEASE_TABLET | Freq: Two times a day (BID) | ORAL | Status: DC
  Administered 2024-02-19 – 2024-02-20 (×2): 600 mg via ORAL
  Filled 2024-02-19 (×3): qty 2

## 2024-02-19 MED ORDER — PHENYLEPHRINE 80 MCG/ML (10ML) SYRINGE FOR IV PUSH (FOR BLOOD PRESSURE SUPPORT)
PREFILLED_SYRINGE | INTRAVENOUS | Status: AC
  Filled 2024-02-19: qty 10

## 2024-02-19 MED ORDER — ACETAMINOPHEN 10 MG/ML IV SOLN
INTRAVENOUS | Status: AC
  Filled 2024-02-19: qty 100

## 2024-02-19 MED ORDER — SODIUM CHLORIDE 0.9% IV SOLUTION: Freq: Once | INTRAVENOUS | Status: DC

## 2024-02-19 MED ORDER — PROPOFOL 10 MG/ML IV BOLUS
INTRAVENOUS | Status: AC
  Filled 2024-02-19: qty 20

## 2024-02-19 MED ORDER — OXYCODONE HCL 5 MG PO TABS
5.0000 mg | ORAL_TABLET | ORAL | Status: DC | PRN
  Administered 2024-02-19: 10 mg via ORAL
  Administered 2024-02-20: 5 mg via ORAL
  Filled 2024-02-19: qty 1
  Filled 2024-02-19: qty 2

## 2024-02-19 MED ORDER — SODIUM CHLORIDE (PF) 0.9 % IJ SOLN
INTRAMUSCULAR | Status: DC | PRN
  Administered 2024-02-19: 20 mL via INTRAVENOUS

## 2024-02-19 MED ORDER — HYDROMORPHONE HCL 1 MG/ML IJ SOLN
INTRAMUSCULAR | Status: AC
  Filled 2024-02-19: qty 1

## 2024-02-19 MED ORDER — CLINDAMYCIN PHOSPHATE 900 MG/50ML IV SOLN
900.0000 mg | INTRAVENOUS | Status: AC
  Administered 2024-02-19: 900 mg via INTRAVENOUS

## 2024-02-19 MED ORDER — FENTANYL CITRATE (PF) 250 MCG/5ML IJ SOLN
INTRAMUSCULAR | Status: DC | PRN
  Administered 2024-02-19: 50 ug via INTRAVENOUS
  Administered 2024-02-19: 100 ug via INTRAVENOUS
  Administered 2024-02-19: 50 ug via INTRAVENOUS

## 2024-02-19 MED ORDER — HYDROMORPHONE HCL 1 MG/ML IJ SOLN: 0.2000 mg | INTRAMUSCULAR | Status: DC | PRN

## 2024-02-19 MED ORDER — CLONAZEPAM 0.5 MG PO TABS: 0.5000 mg | ORAL_TABLET | Freq: Every day | ORAL | Status: DC | PRN

## 2024-02-19 MED ORDER — SODIUM CHLORIDE 0.9 % IV SOLN: 10.0000 mL/h | Freq: Once | INTRAVENOUS | Status: AC

## 2024-02-19 MED ORDER — ACETAMINOPHEN 325 MG PO TABS: 650.0000 mg | ORAL_TABLET | ORAL | Status: DC | PRN

## 2024-02-19 MED ORDER — CLINDAMYCIN PHOSPHATE 900 MG/50ML IV SOLN
INTRAVENOUS | Status: AC
  Filled 2024-02-19: qty 50

## 2024-02-19 MED ORDER — DEXAMETHASONE SODIUM PHOSPHATE 10 MG/ML IJ SOLN
INTRAMUSCULAR | Status: DC | PRN
  Administered 2024-02-19: 10 mg via INTRAVENOUS

## 2024-02-19 MED ORDER — OXYCODONE HCL 5 MG/5ML PO SOLN
5.0000 mg | Freq: Once | ORAL | Status: AC | PRN
  Administered 2024-02-19: 5 mg via ORAL

## 2024-02-19 MED ORDER — CHLORHEXIDINE GLUCONATE 0.12 % MT SOLN: 15.0000 mL | Freq: Once | OROMUCOSAL | Status: AC

## 2024-02-19 MED ORDER — DEXAMETHASONE SODIUM PHOSPHATE 10 MG/ML IJ SOLN
INTRAMUSCULAR | Status: AC
  Filled 2024-02-19: qty 1

## 2024-02-19 MED ORDER — IBUPROFEN 600 MG PO TABS
600.0000 mg | ORAL_TABLET | Freq: Four times a day (QID) | ORAL | Status: DC
  Administered 2024-02-19 – 2024-02-20 (×2): 600 mg via ORAL
  Filled 2024-02-19 (×2): qty 1

## 2024-02-19 MED ORDER — MIDAZOLAM HCL 2 MG/2ML IJ SOLN
INTRAMUSCULAR | Status: DC | PRN
  Administered 2024-02-19: 2 mg via INTRAVENOUS

## 2024-02-19 MED ORDER — ROCURONIUM BROMIDE 10 MG/ML (PF) SYRINGE
PREFILLED_SYRINGE | INTRAVENOUS | Status: DC | PRN
  Administered 2024-02-19: 100 mg via INTRAVENOUS

## 2024-02-19 MED ORDER — BUPIVACAINE LIPOSOME 1.3 % IJ SUSP
INTRAMUSCULAR | Status: AC
  Filled 2024-02-19: qty 20

## 2024-02-19 MED ORDER — KETAMINE HCL 10 MG/ML IJ SOLN
INTRAMUSCULAR | Status: DC | PRN
Start: 2024-02-19 — End: 2024-02-19
  Administered 2024-02-19: 30 mg via INTRAVENOUS

## 2024-02-19 MED ORDER — ONDANSETRON HCL 4 MG/2ML IJ SOLN: 4.0000 mg | Freq: Once | INTRAMUSCULAR | Status: DC | PRN

## 2024-02-19 MED ORDER — OXYCODONE HCL 5 MG/5ML PO SOLN
ORAL | Status: AC
  Filled 2024-02-19: qty 5

## 2024-02-19 MED ORDER — FLUOXETINE HCL 10 MG PO CAPS
10.0000 mg | ORAL_CAPSULE | Freq: Every day | ORAL | Status: DC
  Administered 2024-02-20: 10 mg via ORAL
  Filled 2024-02-19: qty 1

## 2024-02-19 MED ORDER — ZOLPIDEM TARTRATE 5 MG PO TABS: 5.0000 mg | ORAL_TABLET | Freq: Every evening | ORAL | Status: DC | PRN

## 2024-02-19 MED ORDER — GENTAMICIN SULFATE 40 MG/ML IJ SOLN
5.0000 mg/kg | INTRAMUSCULAR | Status: AC
  Administered 2024-02-19: 460 mg via INTRAVENOUS
  Filled 2024-02-19: qty 11.5

## 2024-02-19 MED ORDER — TRANEXAMIC ACID-NACL 1000-0.7 MG/100ML-% IV SOLN
INTRAVENOUS | Status: AC
  Filled 2024-02-19: qty 100

## 2024-02-19 MED ORDER — BUPIVACAINE LIPOSOME 1.3 % IJ SUSP
INTRAMUSCULAR | Status: DC | PRN
  Administered 2024-02-19: 20 mL

## 2024-02-19 MED ORDER — EPHEDRINE 5 MG/ML INJ
INTRAVENOUS | Status: AC
Start: 2024-02-19 — End: 2024-02-19
  Filled 2024-02-19: qty 5

## 2024-02-19 MED ORDER — POVIDONE-IODINE 10 % EX SWAB
2.0000 | Freq: Once | CUTANEOUS | Status: AC
  Administered 2024-02-19: 2 via TOPICAL

## 2024-02-19 MED ORDER — METFORMIN HCL 500 MG PO TABS
500.0000 mg | ORAL_TABLET | Freq: Two times a day (BID) | ORAL | Status: DC
  Administered 2024-02-19 – 2024-02-20 (×2): 500 mg via ORAL
  Filled 2024-02-19 (×2): qty 1

## 2024-02-19 MED ORDER — ORAL CARE MOUTH RINSE: 15.0000 mL | Freq: Once | OROMUCOSAL | Status: AC

## 2024-02-19 MED ORDER — ACETAMINOPHEN 10 MG/ML IV SOLN
INTRAVENOUS | Status: DC | PRN
  Administered 2024-02-19: 1000 mg via INTRAVENOUS

## 2024-02-19 MED ORDER — ROCURONIUM BROMIDE 10 MG/ML (PF) SYRINGE
PREFILLED_SYRINGE | INTRAVENOUS | Status: AC
  Filled 2024-02-19: qty 10

## 2024-02-19 MED ORDER — HYDROMORPHONE HCL 1 MG/ML IJ SOLN
0.2500 mg | INTRAMUSCULAR | Status: DC | PRN
  Administered 2024-02-19: 0.25 mg via INTRAVENOUS

## 2024-02-19 MED ORDER — DOCUSATE SODIUM 100 MG PO CAPS
100.0000 mg | ORAL_CAPSULE | Freq: Two times a day (BID) | ORAL | Status: DC
  Administered 2024-02-19 – 2024-02-20 (×2): 100 mg via ORAL
  Filled 2024-02-19 (×2): qty 1

## 2024-02-19 MED ORDER — TRANEXAMIC ACID-NACL 1000-0.7 MG/100ML-% IV SOLN
1000.0000 mg | Freq: Once | INTRAVENOUS | Status: AC
  Administered 2024-02-19: 1000 mg via INTRAVENOUS

## 2024-02-19 MED ORDER — CHLORTHALIDONE 25 MG PO TABS
12.5000 mg | ORAL_TABLET | Freq: Every day | ORAL | Status: DC
  Administered 2024-02-19 – 2024-02-20 (×2): 12.5 mg via ORAL
  Filled 2024-02-19 (×3): qty 0.5

## 2024-02-19 MED ORDER — LIDOCAINE 2% (20 MG/ML) 5 ML SYRINGE
INTRAMUSCULAR | Status: DC | PRN
  Administered 2024-02-19: 80 mg via INTRAVENOUS

## 2024-02-19 MED ORDER — ONDANSETRON HCL 4 MG/2ML IJ SOLN
INTRAMUSCULAR | Status: AC
Start: 2024-02-19 — End: 2024-02-19
  Filled 2024-02-19: qty 2

## 2024-02-19 MED ORDER — LACTATED RINGERS IV SOLN: INTRAVENOUS | Status: DC

## 2024-02-19 MED ORDER — LIDOCAINE 2% (20 MG/ML) 5 ML SYRINGE
INTRAMUSCULAR | Status: AC
  Filled 2024-02-19: qty 5

## 2024-02-19 MED ORDER — QUETIAPINE FUMARATE 50 MG PO TABS
50.0000 mg | ORAL_TABLET | Freq: Every day | ORAL | Status: DC
  Administered 2024-02-19: 50 mg via ORAL
  Filled 2024-02-19: qty 1

## 2024-02-19 MED ORDER — SUGAMMADEX SODIUM 200 MG/2ML IV SOLN
INTRAVENOUS | Status: AC
  Filled 2024-02-19: qty 2

## 2024-02-19 MED ORDER — CHLORHEXIDINE GLUCONATE 0.12 % MT SOLN
OROMUCOSAL | Status: AC
  Administered 2024-02-19: 15 mL via OROMUCOSAL
  Filled 2024-02-19: qty 15

## 2024-02-19 MED ORDER — SIMETHICONE 80 MG PO CHEW
80.0000 mg | CHEWABLE_TABLET | Freq: Four times a day (QID) | ORAL | Status: DC | PRN
  Administered 2024-02-20: 80 mg via ORAL
  Filled 2024-02-19: qty 1

## 2024-02-19 MED ORDER — SUGAMMADEX SODIUM 200 MG/2ML IV SOLN
INTRAVENOUS | Status: DC | PRN
  Administered 2024-02-19: 400 mg via INTRAVENOUS

## 2024-02-19 MED ORDER — ONDANSETRON HCL 4 MG/2ML IJ SOLN
INTRAMUSCULAR | Status: DC | PRN
  Administered 2024-02-19: 4 mg via INTRAVENOUS

## 2024-02-19 MED ORDER — LAMOTRIGINE 100 MG PO TABS
200.0000 mg | ORAL_TABLET | Freq: Two times a day (BID) | ORAL | Status: DC
Start: 2024-02-19 — End: 2024-02-20
  Administered 2024-02-19 – 2024-02-20 (×2): 200 mg via ORAL
  Filled 2024-02-19 (×2): qty 2

## 2024-02-19 MED ORDER — OXYCODONE HCL 5 MG PO TABS: 5.0000 mg | ORAL_TABLET | Freq: Once | ORAL | Status: AC | PRN

## 2024-02-19 MED ORDER — KETOROLAC TROMETHAMINE 30 MG/ML IJ SOLN
INTRAMUSCULAR | Status: AC
  Filled 2024-02-19: qty 1

## 2024-02-19 MED ORDER — MENTHOL 3 MG MT LOZG: 1.0000 | LOZENGE | OROMUCOSAL | Status: DC | PRN

## 2024-02-19 MED ORDER — ALUM & MAG HYDROXIDE-SIMETH 200-200-20 MG/5ML PO SUSP: 30.0000 mL | ORAL | Status: DC | PRN

## 2024-02-19 MED ORDER — METOPROLOL SUCCINATE ER 100 MG PO TB24
100.0000 mg | ORAL_TABLET | Freq: Every day | ORAL | Status: DC
  Administered 2024-02-20: 100 mg via ORAL
  Filled 2024-02-19: qty 1

## 2024-02-19 SURGICAL SUPPLY — 35 items
BAG COUNTER SPONGE SURGICOUNT (BAG) ×1 IMPLANT
BENZOIN TINCTURE PRP APPL 2/3 (GAUZE/BANDAGES/DRESSINGS) IMPLANT
CANISTER SUCTION 3000ML PPV (SUCTIONS) ×1 IMPLANT
COVER MAYO STAND STRL (DRAPES) ×1 IMPLANT
DRAPE WARM FLUID 44X44 (DRAPES) IMPLANT
DRSG OPSITE POSTOP 4X10 (GAUZE/BANDAGES/DRESSINGS) ×1 IMPLANT
DURAPREP 26ML APPLICATOR (WOUND CARE) ×1 IMPLANT
GAUZE 4X4 16PLY ~~LOC~~+RFID DBL (SPONGE) IMPLANT
GLOVE ECLIPSE 7.0 STRL STRAW (GLOVE) ×1 IMPLANT
GLOVE SURG UNDER POLY LF SZ7 (GLOVE) ×2 IMPLANT
GOWN STRL REUS W/ TWL LRG LVL3 (GOWN DISPOSABLE) ×3 IMPLANT
HEMOSTAT ARISTA ABSORB 3G PWDR (HEMOSTASIS) IMPLANT
HIBICLENS CHG 4% 4OZ BTL (MISCELLANEOUS) ×1 IMPLANT
KIT TURNOVER KIT B (KITS) ×1 IMPLANT
LIGASURE IMPACT 36 18CM CVD LR (INSTRUMENTS) IMPLANT
NDL HYPO 22X1.5 SAFETY MO (MISCELLANEOUS) ×1 IMPLANT
NEEDLE HYPO 22X1.5 SAFETY MO (MISCELLANEOUS) ×1 IMPLANT
NS IRRIG 1000ML POUR BTL (IV SOLUTION) ×1 IMPLANT
PACK ABDOMINAL GYN (CUSTOM PROCEDURE TRAY) ×1 IMPLANT
PAD ARMBOARD POSITIONER FOAM (MISCELLANEOUS) ×1 IMPLANT
PAD OB MATERNITY 11 LF (PERSONAL CARE ITEMS) ×1 IMPLANT
RTRCTR C-SECT PINK 25CM LRG (MISCELLANEOUS) IMPLANT
SPECIMEN JAR MEDIUM (MISCELLANEOUS) ×1 IMPLANT
SPIKE FLUID TRANSFER (MISCELLANEOUS) ×2 IMPLANT
SPONGE INTESTINAL PEANUT (DISPOSABLE) IMPLANT
SPONGE T-LAP 18X18 ~~LOC~~+RFID (SPONGE) IMPLANT
STRIP CLOSURE SKIN 1/2X4 (GAUZE/BANDAGES/DRESSINGS) IMPLANT
SUT MNCRL 0 MO-4 VIOLET 18 CR (SUTURE) ×3 IMPLANT
SUT MNCRL 0 VIOLET 6X18 (SUTURE) ×1 IMPLANT
SUT MNCRL AB 0 CT1 27 (SUTURE) ×1 IMPLANT
SUT PDS AB 0 CTX 60 (SUTURE) ×2 IMPLANT
SUT PLAIN 2 0 XLH (SUTURE) IMPLANT
SUT VIC AB 4-0 KS 27 (SUTURE) IMPLANT
TOWEL GREEN STERILE FF (TOWEL DISPOSABLE) ×2 IMPLANT
TRAY FOLEY W/BAG SLVR 14FR (SET/KITS/TRAYS/PACK) ×1 IMPLANT

## 2024-02-19 NOTE — Anesthesia Procedure Notes (Signed)
 Procedure Name: Intubation Date/Time: 02/19/2024 7:52 AM  Performed by: Elby Raelene SAUNDERS, CRNAPre-anesthesia Checklist: Patient identified, Emergency Drugs available, Suction available and Patient being monitored Patient Re-evaluated:Patient Re-evaluated prior to induction Oxygen Delivery Method: Circle System Utilized Preoxygenation: Pre-oxygenation with 100% oxygen Induction Type: IV induction Ventilation: Mask ventilation without difficulty Laryngoscope Size: Glidescope and 3 Grade View: Grade III Tube type: Oral Tube size: 7.0 mm Number of attempts: 1 Airway Equipment and Method: Stylet, Oral airway and Bite block Placement Confirmation: ETT inserted through vocal cords under direct vision, positive ETCO2 and breath sounds checked- equal and bilateral Secured at: 22 cm Tube secured with: Tape Dental Injury: Teeth and Oropharynx as per pre-operative assessment

## 2024-02-19 NOTE — Anesthesia Postprocedure Evaluation (Signed)
 Anesthesia Post Note  Patient: Christine [name]  Procedure(s) Performed: HYSTERECTOMY, TOTAL, ABDOMINAL, WITH SALPINGECTOMY (Left)     Patient location during evaluation: PACU Anesthesia Type: General Level of consciousness: awake and alert Pain management: pain level controlled Vital Signs Assessment: post-procedure vital signs reviewed and stable Respiratory status: spontaneous breathing, nonlabored ventilation, respiratory function stable and patient connected to nasal cannula oxygen Cardiovascular status: blood pressure returned to baseline and stable Postop Assessment: no apparent nausea or vomiting Anesthetic complications: no   No notable events documented.  Last Vitals:  Vitals:   02/19/24 1145 02/19/24 1215  BP: (!) 144/81 137/77  Pulse: 68 68  Resp: 19 17  Temp: 36.9 C   SpO2: 96% 93%    Last Pain:  Vitals:   02/19/24 1145  TempSrc:   PainSc: Asleep                 Garnette DELENA Gab

## 2024-02-19 NOTE — Transfer of Care (Signed)
 Immediate Anesthesia Transfer of Care Note  Patient: Christine [name]  Procedure(s) Performed: HYSTERECTOMY, TOTAL, ABDOMINAL, WITH SALPINGECTOMY (Left)  Patient Location: PACU  Anesthesia Type:General  Level of Consciousness: awake, alert , and oriented  Airway & Oxygen Therapy: Patient Spontanous Breathing and Patient connected to face mask oxygen  Post-op Assessment: Report given to RN and Post -op Vital signs reviewed and stable  Post vital signs: Reviewed and stable  Last Vitals:  Vitals Value Taken Time  BP 132/71 02/19/24 10:31  Temp 37.4 C 02/19/24 10:30  Pulse 71 02/19/24 10:33  Resp 28 02/19/24 10:33  SpO2 95 % 02/19/24 10:33  Vitals shown include unfiled device data.  Last Pain:  Vitals:   02/19/24 1030  TempSrc:   PainSc: Asleep         Complications: No notable events documented.

## 2024-02-19 NOTE — H&P (Signed)
 Interval H&P  No changes to above H&P.  BP 139/77   Pulse 85   Temp 98.1 F (36.7 C) (Oral)   Resp 18   Ht 5' 4 (1.626 m)   Wt (!) 146.1 kg   LMP 01/26/2024 (Exact Date)   SpO2 97%   BMI 55.27 kg/m   Gen: alert, well appearing, no distress Chest: nonlabored breathing CV: no peripheral edema Abdomen: soft, nontender Ext: no evidence of DVT  1u pRBC infusing currently.  Procedure discussed again with patient, risks include but are not limited to bleeding, infection, damage to nearby organs, additional transfusion.   All questions answered, will proceed as able.   Christine [name]

## 2024-02-19 NOTE — Progress Notes (Signed)
 Postoperative Check  Patient reports doing well throughout the day. Pain is well controlled, denies nausea, vomiting, distension. She is tolerating solid PO.  She has not ambulated and foley remains in place.   BP 127/63 (BP Location: Right Arm)   Pulse 71   Temp 99 F (37.2 C) (Oral)   Resp 18   Ht 5' 4 (1.626 m)   Wt (!) 146.1 kg   LMP 01/26/2024 (Exact Date)   SpO2 100%   BMI 55.27 kg/m   Gen: alert, well appearing, no distress Chest: nonlabored breathing CV: no peripheral edema Abdomen: soft, ATTP Ext: no evidence of DVT  A/P; POD#0 s/p TAH LSO, LOA Patient received 2u pRBC in OR.  Postoperative HGB this evening reassuring at 10.0 (preoperative HGB 7.8). Discussed continued recovery, including d/c foley, ambulation, incentive spirometer Discussed adherent and cystic left ovary. She may be a candidate for HRT following immediate post op recovery period Recheck CBC in AM Will consult PT/OT All questions answered

## 2024-02-19 NOTE — Op Note (Signed)
 Operative Note  PREOPERATIVE DIAGNOSES: 1. Enlarged fibroid uterus. 2. Abnormal uterine bleeding, chronic anemia  POSTOPERATIVE DIAGNOSES: 1. Enlarged fibroid uterus. 2. Abnormal uterine bleeding, chronic anemia 3. Adherent left ovary with cyst 4. Peritoneal fluid noted upon entry  PROCEDURE PERFORMED: Total abdominal hysterectomy with left salpingo-oophorectomy, lysis of adhesions  SURGEON: Dr. Massie Smiles ASSISTANT: Dr. Slater Door  ANESTHESIA: General   ESTIMATED BLOOD LOSS: 650 cc.  URINE OUTPUT: Clear urine at the end of the procedure.  FLUIDS: Per anesthesia record  COMPLICATIONS: None   TUBES: None.  DRAINS: Foley to gravity.  PATHOLOGY: Uterus, cervix, and multiple fibroids, left tube and ovary were sent to pathology for review. Peritoneal fluid sent for cytology, suspect ruptured ovarian cyst  FINDINGS: On exam, under anesthesia, normal appearing vulva and vagina, a massively enlarged uterus approximately 20 weeks' in size with irregular contours suggestive of fibroids. Upon entry to peritoneum, clear straw colored fluid was noted, suctioned and sent for cytology. No injury suspected.  A second gush noted during manipulation from rupture of ovarian cyst. The left ovary was also adherent to the posterior uterus and adhesions included the IP.   The bowel and omentum were normal appearing.  Procedure: The patient was prepped and draped in the usual sterile manner for an abdominal procedure. A pfannenstiel incision was made and carried down to the underlying fascia. Fascia was incised in the midline and extended bilaterally with mayo scissors. Underlying rectus muscles were separated from the fascia superiorly and inferiorly in the usual fashion. Peritoneum was entered sharply with hemostat and metz with good visualization of the bladder and bowel. Yellow straw colored clear ascitic fluid noted. The uterus was then identified and grasped with upward traction. The large  fibroids were manipulated to exteriorize the uterus. The round ligaments on either side were identified and individually dissected and ligated with #0 Vicryl suture and divided. This allowed us  to then create a bladder flap by both blunt and sharp dissection.   The left adnexal was adhered to the posterior uterus, in taking the utero-ovarian ligament, it was ntod that the IP was also involved and unable to be dissected. Further, an ovarian cyst was noted and ruptured with manipulation of the uterus. The left ovary and tube were thus removed with ligasure cautery.   We then skeletonized the uterine vessels on either side and carefully dissected the bladder flap anteriorly. Posteriorly, the peritoneum was dissected down toward the uterosacral ligaments. Heaney clamps were then placed at each isthmic portion of the cervical body junction where the uterine arteries adjoined the uterus. These were clamped, ligated and divided using #0 Vicryl suture. The remainder of the uterus was then removed by the clamp-cut-ligation technique using #0 Vicryl on all major pedicles. A cervical fibroid was distorting the cervix, and massive uterus was limiting visualization. For this reason, the uterus was removed via cautery and cervix grasped with Kocher clamps.  The cervix was then removed with Heaney clamp and ligation, exposing vaginal mucosa. The vaginal cuff was closed with two haney stitches and then with serial figure of either stiches using #0 vicryl. Hemostasis was then inspected and secured throughout the entire area including the vaginal cuff, all pedicles, and the bladder. Arista was placed following irrigation to confirm hemostasis.   Throughout the removal, significant back bleeding and engorge vessels resulted in increased blood loss. Given pre-existing anemia, 2u pRBCs were given intra-operatively.   The lap sponges were then removed and the self-retaining retractor was removed. The patient tolerated  the  operation nicely. There were no complications associated with this surgical procedure to this point. The sponge count was correct times 2 at this time. The Foley catheter was inspected and clear urine was noted. Having removed all instruments and packs, we then began closure of the abdomen.  The fascia was closed with #0 loop PDS in a running continuous manner and the subcutaneous tissue was also closed with #2-0 Vicryl in a running continuous manner. Hemostasis was secured throughout the entire layers. Subcutaneous closure was completed with plain gut, and incision was closed with vicryl. The patient tolerated the operation nicely and was then taken to the Recovery Room in good condition.   Massie Smiles MD

## 2024-02-20 ENCOUNTER — Encounter (HOSPITAL_COMMUNITY): Payer: Self-pay | Admitting: Obstetrics and Gynecology

## 2024-02-20 LAB — TYPE AND SCREEN
ABO/RH(D): O POS
Antibody Screen: NEGATIVE
Unit division: 0
Unit division: 0
Unit division: 0

## 2024-02-20 LAB — BPAM RBC
Blood Product Expiration Date: 202508112359
Blood Product Expiration Date: 202508112359
Blood Product Expiration Date: 202508122359
ISSUE DATE / TIME: 202507170633
ISSUE DATE / TIME: 202507170932
ISSUE DATE / TIME: 202507170932
Unit Type and Rh: 5100
Unit Type and Rh: 5100
Unit Type and Rh: 5100

## 2024-02-20 MED ORDER — ACETAMINOPHEN 325 MG PO TABS: 650.0000 mg | ORAL_TABLET | ORAL | 0 refills | Status: AC | PRN

## 2024-02-20 MED ORDER — OXYCODONE HCL 5 MG PO TABS: 5.0000 mg | ORAL_TABLET | ORAL | 0 refills | Status: AC | PRN

## 2024-02-20 MED ORDER — IBUPROFEN 600 MG PO TABS: 600.0000 mg | ORAL_TABLET | Freq: Four times a day (QID) | ORAL | 0 refills | Status: AC

## 2024-02-20 MED ORDER — DOCUSATE SODIUM 100 MG PO CAPS: 100.0000 mg | ORAL_CAPSULE | Freq: Every day | ORAL | 0 refills | Status: AC

## 2024-02-20 NOTE — Plan of Care (Signed)

## 2024-02-20 NOTE — Discharge Summary (Signed)
 Gynecology Discharge Summary  Christine [name] is a 48 y.o. female that presented on 02/19/2024 for total abdominal hysterectomy, left salpingectomy for abnormal uterine bleeding and acute on chronic anemia. She received 1 unit of pRBC preoperatively for hemoglobin of 7.8.  Her surgery was notable for adherent left ovary and cyst, as well as pelvic fluid noted upon entry, possibly from ruptured ovarian cyst. Decision was made to remove this ovary. Blood loss due to large fibroid uterus indicated intraoperative transfusion of 2 units pRBC.  Post transfusion HGB was 10.0 the evening of POD#0.  Her postoperative course was uncomplicated.  On POD#1, she reported well controlled pain, ambulating without difficulty, voiding spontaneously.  She was seen by PT for mobility teaching. She desired discharge home and was discharged home in stable condition with plans for in-office follow up.   Hemoglobin  Date Value Ref Range Status  02/19/2024 10.0 (L) 12.0 - 15.0 g/dL Final  92/98/7974 8.1 (L) 12.0 - 15.0 g/dL Final    Comment:    Reticulocyte Hemoglobin testing may be clinically indicated, consider ordering this additional test [id number]   09/28/2020 9.2 (L) 11.1 - 15.9 g/dL Final   HCT  Date Value Ref Range Status  02/19/2024 33.5 (L) 36.0 - 46.0 % Final   Hematocrit  Date Value Ref Range Status  09/28/2020 30.9 (L) 34.0 - 46.6 % Final    Physical Exam:  Gen: alert, well appearing, no distress Chest: nonlabored breathing CV: no peripheral edema Abdomen: soft, nondistended Ext: no evidence of DVT  Discharge Diagnoses: TAH, LSO, LOA for abnormal uterine bleeding, enlarged fibroid uterus  Discharge Information: Date: 02/20/2024 Activity: Pelvic rest, as tolerated Diet: routine Medications: Tylenol , motrin , oxycodone  prescribed. Home medications were continued.  Condition: stable Instructions: Discussed prior to discharge.  Discharge to: Home  Follow-up Information     [place] Follow up.   Why: Please follow up for a postoperative visit. Contact information: [address] [place] [address] [phone]                  Evalene DELENA Smiles 02/20/2024, 7:48 PM

## 2024-02-20 NOTE — Evaluation (Signed)
 Physical Therapy Brief Evaluation and Discharge Note Patient Details Name: Christine [name] MRN: [id number] DOB: 07/25/1976 Today's Date: 02/20/2024   History of Present Illness  Christine [name] is a 48 y.o. female who presented 02/19/24 for total abdominal hysterectomy with left salpingo-oophorectomy, lysis of adhesions due to uterine fibroids. 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   Clinical Impression  Pt greeted supine in bed, pleasant and agreeable to PT evaluation. PTA, she was independent with functional mobility, ADLs/IADLs, and driving. She demonstrated UE/LE ROM and strength WFL. Cued pt on log roll technique. She performed bed mobility with modI. Pt engaged in transfers from various surfaces independently. She ambulated ~320ft and ascended/descended 5 steps with BUE support with modI. No unsteadiness or LOB noted. Pt feels ready and safe for discharge home. I have answered all her questions related to mobility. She appears very close to her baseline function, no further acute PT needs.      PT Assessment Patient does not need any further PT services  Assistance Needed at Discharge  PRN    Equipment Recommendations None recommended by PT  Recommendations for Other Services       Precautions/Restrictions Precautions Precautions: Fall;Other (comment) (Abdomen) Recall of Precautions/Restrictions: Intact Restrictions Weight Bearing Restrictions Per Provider Order: No        Mobility  Bed Mobility Rolling: Independent Supine/Sidelying to sit: Independent Sit to supine/sidelying: Independent General bed mobility comments: Cues for sequencing. Instructed pt on log roll technique. She was  able to perform bed mobility from a flat surface without rails or physcial assist.  Transfers Overall transfer level: Independent Equipment used: None               General transfer comment: Pt stood from lowest bed  height and couch. She pushed up with BUE support from the surface she was on. Good eccentric control.    Ambulation/Gait Ambulation/Gait assistance: Modified independent (Device/Increase time) Gait Distance (Feet): 300 Feet Assistive device: None Gait Pattern/deviations: Step-through pattern, Decreased stride length Gait Speed: Pace WFL General Gait Details: Pt ambulated with a reciprocal gait pattern, even weight shift, and good foot clearence. She demonstrated an equal arm swing. Navigated room/hallway well without LOB.  Home Activity Instructions Home Activity Instructions: Encouraged pt to engage in daily mobility going on walks.  Stairs Stairs: Yes Stairs assistance: Modified independent (Device/Increase time) Stair Management: One rail Left, Forwards, Alternating pattern Number of Stairs: 5 General stair comments: Pt ascended/descended half a flight of stairs with unilateral handrail and HHA on opposite side to act as her handrail at home. No cues for sequencing. She turned around on the 5th step and went back down smoothly.  Modified Rankin (Stroke Patients Only)        Balance Overall balance assessment: Modified Independent                        Pertinent Vitals/Pain PT - Brief Vital Signs All Vital Signs Stable: Yes Pain Assessment Pain Assessment: No/denies pain     Home Living Family/patient expects to be discharged to:: Private residence Living Arrangements: Spouse/significant other;Children (Husband and 2 kids (tween-teen)) Available Help at Discharge: Family;Available PRN/intermittently Home Environment: Level entry   Home Equipment: None        Prior Function Level of Independence: Independent Comments: Pt was independent with functional mobility, ADLs, and IADLs without an AD. Drove.    UE/LE Assessment   UE ROM/Strength/Tone/Coordination: [facility]  LE ROM/Strength/Tone/Coordination: [facility]      Communication    Communication Communication: No apparent difficulties     Cognition Overall Cognitive Status: Appears within functional limits for tasks assessed/performed       General Comments      Exercises     Assessment/Plan    PT Problem List         PT Visit Diagnosis Other abnormalities of gait and mobility (R26.89)    No Skilled PT Patient is modified independent with all activity/mobility;All education completed   Co-evaluation                AMPAC 6 Clicks Help needed turning from your back to your side while in a flat bed without using bedrails?: None Help needed moving from lying on your back to sitting on the side of a flat bed without using bedrails?: None Help needed moving to and from a bed to a chair (including a wheelchair)?: None Help needed standing up from a chair using your arms (e.g., wheelchair or bedside chair)?: None Help needed to walk in hospital room?: None Help needed climbing 3-5 steps with a railing? : None 6 Click Score: 24      End of Session   Activity Tolerance: Patient tolerated treatment well Patient left: in bed;with call bell/phone within reach Nurse Communication: Mobility status PT Visit Diagnosis: Other abnormalities of gait and mobility (R26.89)     Time: 9040-8977 PT Time Calculation (min) (ACUTE ONLY): 23 min  Charges:   PT Evaluation $PT Eval Low Complexity: 1 Low PT Treatments $Gait Training: 8-22 mins    Randall SAUNDERS, PT, DPT Acute Rehabilitation Services Office: [phone] Secure Chat Preferred  Delon CHRISTELLA Callander  02/20/2024, 10:44 AM

## 2024-02-20 NOTE — Progress Notes (Signed)
 Gynecology Progress Note  Christine [name] is POD#1 s/p TAH, LSO, LOA.  Subjective:  Patient reports no overnight events.  She reports well controlled pain, ambulating slowly to the bathroom, voiding spontaneously as of foley removal this AM, tolerating PO.  She reports Positive flatus, Negative BM.  Vaginal bleeding is scant.  Objective: Blood pressure 136/67, pulse 65, temperature 98.2 F (36.8 C), temperature source Oral, resp. rate 16, height 5' 4 (1.626 m), weight (!) 146.1 kg, last menstrual period 01/26/2024, SpO2 94%.  Physical Exam:  Gen: alert, well appearing, no distress Chest: nonlabored breathing CV: no peripheral edema Abdomen: soft, nondistended, ATTP Incision: dressing in place Ext: No evidence of DVT  Recent Labs    02/19/24 1816  HGB 10.0*  HCT 33.5*    Assessment/Plan: Postoperative day #1, s/p TAH, LSO, LOA Acute on chronic anemia, clinically significant. Received 2 u pRBC in OR, as well as 1u pRBC in pre-op.  Pre Op HGB 7.8 >> 10.0 post transfusion yesterday evening. No s/s anemia. Urine output adequate. Spontaneous void 250 cc following foley removal early this AM PT consult for postoperative activity Diet - carb modified for T2DM OSA - Home CPAP SCDs for DVT PPx Doing well this AM, continue routine care. She is hopeful for discharge home today. Will assess ambulation, PT consulted, consider discharge home if ready, otherwise reassess in AM.    LOS: 1 day   Christine [name] 02/20/2024, 7:29 AM

## 2024-02-22 ENCOUNTER — Other Ambulatory Visit: Payer: Self-pay | Admitting: Adult Health

## 2024-02-23 LAB — SURGICAL PATHOLOGY

## 2024-02-23 LAB — CYTOLOGY - NON PAP

## 2024-02-24 DIAGNOSIS — F3181 Bipolar II disorder: Secondary | ICD-10-CM | POA: Diagnosis not present

## 2024-03-02 ENCOUNTER — Encounter: Payer: Self-pay | Admitting: Podiatry

## 2024-03-02 DIAGNOSIS — F3181 Bipolar II disorder: Secondary | ICD-10-CM | POA: Diagnosis not present

## 2024-03-04 DIAGNOSIS — E785 Hyperlipidemia, unspecified: Secondary | ICD-10-CM | POA: Diagnosis not present

## 2024-03-04 DIAGNOSIS — Z90710 Acquired absence of both cervix and uterus: Secondary | ICD-10-CM | POA: Diagnosis not present

## 2024-03-04 DIAGNOSIS — R7303 Prediabetes: Secondary | ICD-10-CM | POA: Diagnosis not present

## 2024-03-04 DIAGNOSIS — I10 Essential (primary) hypertension: Secondary | ICD-10-CM | POA: Diagnosis not present

## 2024-03-04 DIAGNOSIS — D509 Iron deficiency anemia, unspecified: Secondary | ICD-10-CM | POA: Diagnosis not present

## 2024-03-04 DIAGNOSIS — I5032 Chronic diastolic (congestive) heart failure: Secondary | ICD-10-CM | POA: Diagnosis not present

## 2024-03-08 ENCOUNTER — Other Ambulatory Visit: Payer: Self-pay | Admitting: Cardiology

## 2024-03-08 DIAGNOSIS — M45A0 Non-radiographic axial spondyloarthritis of unspecified sites in spine: Secondary | ICD-10-CM | POA: Diagnosis not present

## 2024-03-08 DIAGNOSIS — Z1589 Genetic susceptibility to other disease: Secondary | ICD-10-CM | POA: Diagnosis not present

## 2024-03-08 DIAGNOSIS — M7662 Achilles tendinitis, left leg: Secondary | ICD-10-CM | POA: Diagnosis not present

## 2024-03-08 DIAGNOSIS — Z6841 Body Mass Index (BMI) 40.0 and over, adult: Secondary | ICD-10-CM | POA: Diagnosis not present

## 2024-03-08 DIAGNOSIS — M1991 Primary osteoarthritis, unspecified site: Secondary | ICD-10-CM | POA: Diagnosis not present

## 2024-03-08 DIAGNOSIS — M545 Low back pain, unspecified: Secondary | ICD-10-CM | POA: Diagnosis not present

## 2024-03-08 DIAGNOSIS — M7661 Achilles tendinitis, right leg: Secondary | ICD-10-CM | POA: Diagnosis not present

## 2024-03-08 DIAGNOSIS — Z79899 Other long term (current) drug therapy: Secondary | ICD-10-CM | POA: Diagnosis not present

## 2024-03-09 DIAGNOSIS — F3181 Bipolar II disorder: Secondary | ICD-10-CM | POA: Diagnosis not present

## 2024-03-16 DIAGNOSIS — F3181 Bipolar II disorder: Secondary | ICD-10-CM | POA: Diagnosis not present

## 2024-03-18 ENCOUNTER — Other Ambulatory Visit: Payer: Self-pay | Admitting: Podiatry

## 2024-03-18 ENCOUNTER — Other Ambulatory Visit: Payer: Self-pay | Admitting: Cardiology

## 2024-03-23 ENCOUNTER — Ambulatory Visit: Admitting: Podiatry

## 2024-03-23 DIAGNOSIS — F3181 Bipolar II disorder: Secondary | ICD-10-CM | POA: Diagnosis not present

## 2024-03-25 ENCOUNTER — Encounter: Payer: Self-pay | Admitting: Podiatry

## 2024-03-25 ENCOUNTER — Ambulatory Visit (INDEPENDENT_AMBULATORY_CARE_PROVIDER_SITE_OTHER): Admitting: Podiatry

## 2024-03-25 DIAGNOSIS — M45A0 Non-radiographic axial spondyloarthritis of unspecified sites in spine: Secondary | ICD-10-CM | POA: Insufficient documentation

## 2024-03-25 DIAGNOSIS — M1991 Primary osteoarthritis, unspecified site: Secondary | ICD-10-CM | POA: Insufficient documentation

## 2024-03-25 DIAGNOSIS — M722 Plantar fascial fibromatosis: Secondary | ICD-10-CM | POA: Diagnosis not present

## 2024-03-25 DIAGNOSIS — Z8669 Personal history of other diseases of the nervous system and sense organs: Secondary | ICD-10-CM | POA: Insufficient documentation

## 2024-03-25 DIAGNOSIS — M7662 Achilles tendinitis, left leg: Secondary | ICD-10-CM

## 2024-03-25 DIAGNOSIS — Z1589 Genetic susceptibility to other disease: Secondary | ICD-10-CM | POA: Insufficient documentation

## 2024-03-25 DIAGNOSIS — Z862 Personal history of diseases of the blood and blood-forming organs and certain disorders involving the immune mechanism: Secondary | ICD-10-CM | POA: Insufficient documentation

## 2024-03-25 DIAGNOSIS — Z79899 Other long term (current) drug therapy: Secondary | ICD-10-CM | POA: Insufficient documentation

## 2024-03-25 DIAGNOSIS — R21 Rash and other nonspecific skin eruption: Secondary | ICD-10-CM | POA: Insufficient documentation

## 2024-03-25 DIAGNOSIS — E611 Iron deficiency: Secondary | ICD-10-CM | POA: Insufficient documentation

## 2024-03-25 DIAGNOSIS — M7661 Achilles tendinitis, right leg: Secondary | ICD-10-CM | POA: Diagnosis not present

## 2024-03-25 DIAGNOSIS — M7660 Achilles tendinitis, unspecified leg: Secondary | ICD-10-CM | POA: Insufficient documentation

## 2024-03-25 NOTE — Progress Notes (Signed)
 She presents today after having been injected for plantar fasciitis and Achilles tendinitis last visit.  States that she really did pretty well for a while but has been recently diagnosed with ankylosing spondylitis.  Her rheumatologist feels that most likely her pain in her hands and the pain in her feet are associated with the ankylosing spondylitis.  She states that she has been in so much pain she can hardly stand to do anything.  Objective: Vital signs are stable alert oriented x 3 of reviewed her past medical history medications allergies surgeries and social history.  Reevaluated her today she still has moderate to severe pain on palpation of the tendo Achilles insertion posterior inferior heel as well as plantar fascia calcaneal insertion site.  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  Plan: We are going to refer her to physical therapy.

## 2024-03-30 DIAGNOSIS — F3181 Bipolar II disorder: Secondary | ICD-10-CM | POA: Diagnosis not present

## 2024-04-02 DIAGNOSIS — Z79899 Other long term (current) drug therapy: Secondary | ICD-10-CM | POA: Diagnosis not present

## 2024-04-02 DIAGNOSIS — E559 Vitamin D deficiency, unspecified: Secondary | ICD-10-CM | POA: Diagnosis not present

## 2024-04-06 DIAGNOSIS — F3181 Bipolar II disorder: Secondary | ICD-10-CM | POA: Diagnosis not present

## 2024-04-07 DIAGNOSIS — Z5181 Encounter for therapeutic drug level monitoring: Secondary | ICD-10-CM | POA: Diagnosis not present

## 2024-04-07 DIAGNOSIS — G4700 Insomnia, unspecified: Secondary | ICD-10-CM | POA: Diagnosis not present

## 2024-04-07 DIAGNOSIS — F3132 Bipolar disorder, current episode depressed, moderate: Secondary | ICD-10-CM | POA: Diagnosis not present

## 2024-04-07 DIAGNOSIS — F411 Generalized anxiety disorder: Secondary | ICD-10-CM | POA: Diagnosis not present

## 2024-04-08 ENCOUNTER — Ambulatory Visit

## 2024-04-13 DIAGNOSIS — F3181 Bipolar II disorder: Secondary | ICD-10-CM | POA: Diagnosis not present

## 2024-04-20 ENCOUNTER — Ambulatory Visit

## 2024-04-20 DIAGNOSIS — F3181 Bipolar II disorder: Secondary | ICD-10-CM | POA: Diagnosis not present

## 2024-04-27 DIAGNOSIS — F3181 Bipolar II disorder: Secondary | ICD-10-CM | POA: Diagnosis not present

## 2024-05-04 DIAGNOSIS — F3181 Bipolar II disorder: Secondary | ICD-10-CM | POA: Diagnosis not present

## 2024-05-05 ENCOUNTER — Inpatient Hospital Stay: Attending: Nurse Practitioner

## 2024-05-11 DIAGNOSIS — F3181 Bipolar II disorder: Secondary | ICD-10-CM | POA: Diagnosis not present

## 2024-05-13 DIAGNOSIS — F411 Generalized anxiety disorder: Secondary | ICD-10-CM | POA: Diagnosis not present

## 2024-05-13 DIAGNOSIS — F3132 Bipolar disorder, current episode depressed, moderate: Secondary | ICD-10-CM | POA: Diagnosis not present

## 2024-05-18 DIAGNOSIS — F3181 Bipolar II disorder: Secondary | ICD-10-CM | POA: Diagnosis not present

## 2024-06-03 DIAGNOSIS — R52 Pain, unspecified: Secondary | ICD-10-CM | POA: Diagnosis not present

## 2024-06-03 DIAGNOSIS — J029 Acute pharyngitis, unspecified: Secondary | ICD-10-CM | POA: Diagnosis not present

## 2024-06-03 DIAGNOSIS — Z03818 Encounter for observation for suspected exposure to other biological agents ruled out: Secondary | ICD-10-CM | POA: Diagnosis not present

## 2024-06-03 DIAGNOSIS — R519 Headache, unspecified: Secondary | ICD-10-CM | POA: Diagnosis not present

## 2024-06-03 DIAGNOSIS — B349 Viral infection, unspecified: Secondary | ICD-10-CM | POA: Diagnosis not present

## 2024-06-03 DIAGNOSIS — R5383 Other fatigue: Secondary | ICD-10-CM | POA: Diagnosis not present

## 2024-06-08 DIAGNOSIS — F3181 Bipolar II disorder: Secondary | ICD-10-CM | POA: Diagnosis not present

## 2024-06-08 DIAGNOSIS — H938X3 Other specified disorders of ear, bilateral: Secondary | ICD-10-CM | POA: Diagnosis not present

## 2024-06-15 DIAGNOSIS — M7661 Achilles tendinitis, right leg: Secondary | ICD-10-CM | POA: Diagnosis not present

## 2024-06-15 DIAGNOSIS — Z1589 Genetic susceptibility to other disease: Secondary | ICD-10-CM | POA: Diagnosis not present

## 2024-06-15 DIAGNOSIS — Z79899 Other long term (current) drug therapy: Secondary | ICD-10-CM | POA: Diagnosis not present

## 2024-06-15 DIAGNOSIS — Z6841 Body Mass Index (BMI) 40.0 and over, adult: Secondary | ICD-10-CM | POA: Diagnosis not present

## 2024-06-15 DIAGNOSIS — M45A0 Non-radiographic axial spondyloarthritis of unspecified sites in spine: Secondary | ICD-10-CM | POA: Diagnosis not present

## 2024-06-15 DIAGNOSIS — M1991 Primary osteoarthritis, unspecified site: Secondary | ICD-10-CM | POA: Diagnosis not present

## 2024-06-15 DIAGNOSIS — M7662 Achilles tendinitis, left leg: Secondary | ICD-10-CM | POA: Diagnosis not present

## 2024-06-22 DIAGNOSIS — F3189 Other bipolar disorder: Secondary | ICD-10-CM | POA: Diagnosis not present

## 2024-06-29 DIAGNOSIS — F3189 Other bipolar disorder: Secondary | ICD-10-CM | POA: Diagnosis not present

## 2024-07-08 DIAGNOSIS — F411 Generalized anxiety disorder: Secondary | ICD-10-CM | POA: Diagnosis not present

## 2024-07-08 DIAGNOSIS — F3132 Bipolar disorder, current episode depressed, moderate: Secondary | ICD-10-CM | POA: Diagnosis not present

## 2024-07-12 DIAGNOSIS — L309 Dermatitis, unspecified: Secondary | ICD-10-CM | POA: Diagnosis not present

## 2024-07-12 DIAGNOSIS — L609 Nail disorder, unspecified: Secondary | ICD-10-CM | POA: Diagnosis not present

## 2024-07-12 DIAGNOSIS — L810 Postinflammatory hyperpigmentation: Secondary | ICD-10-CM | POA: Diagnosis not present

## 2024-08-09 ENCOUNTER — Ambulatory Visit: Admitting: Nurse Practitioner

## 2024-08-09 ENCOUNTER — Other Ambulatory Visit

## 2024-08-15 NOTE — Progress Notes (Unsigned)
 "     [facility]   [phone] [phone]    Patient Care Team: Cristopher Bottcher, NP as PCP - General (Family Medicine) Anner Alm ORN, MD as PCP - Cardiology (Cardiology)   CHIEF COMPLAINT: Follow up IDA  CURRENT THERAPY: IV iron  as needed, previously Feraheme  now Venofer , last given 06/01/2021 and 06/09/2021; she does not tolerate oral iron    INTERVAL HISTORY Christine [name] returns for follow up as scheduled. Last seen by me 02/2024 prior to hysterectomy.   ROS   Past Medical History:  Diagnosis Date   Allergy    Anemia    w/ blood transfusion;  Presumably from menorrhagia/menomenorrhagia; has had both blood and iron  transfusions   Ankylosing spondylitis (HCC) 02/02/2024   recent dx in 01/2024   Anxiety    Back pain    no current problems as of 01/28/23   Bipolar 1 disorder (HCC)    With depression   Chest pain    CHF (congestive heart failure) (HCC)    Depression    Diastolic heart failure (HCC)    Dyspnea    exertion   Essential hypertension    Fibromyalgia    History of blood transfusion 08/2017   [facility]   History of hiatal hernia    IBS (irritable bowel syndrome)    no current problems as of 01/28/23   Infertility, female    Lactose intolerance    Lower extremity edema    hx   Migraines    Morbid obesity (HCC)    Osteoarthritis    Hands, ankles; HLA-B27 positive   PCOS (polycystic ovarian syndrome)    treated metformin    Pneumonia    x several   Pre-diabetes    5.7 hbga!c 05/02/22, no meds - takes metformin  for weight loss per pt on 02/11/24   Seasonal allergies    With recurrent allergic rhinitis   Sleep apnea    uses nightly   SOB (shortness of breath)    Uterine leiomyoma    Vitamin D  deficiency      Past Surgical History:  Procedure Laterality Date   BIOPSY  03/13/2023   Procedure: BIOPSY;  Surgeon: Charlanne Groom, MD;  Location: [facility] ENDOSCOPY;  Service: Gastroenterology;;   CARDIAC CT [facility]  03/2019    Ca Score 0.  Normal Coronary origin R dominant. No evidence of CAD. ? Liver nodules - poorly visualized (consider MRI w & w/o Gad contrast)   CERVICAL POLYPECTOMY  01/15/2018   Procedure: CERVICAL POLYPECTOMY;  Surgeon: Leva Rush, MD;  Location: [facility] ORS;  Service: Gynecology;;   COLONOSCOPY  2008   Normal   COLONOSCOPY WITH PROPOFOL  N/A 03/13/2023   Procedure: COLONOSCOPY WITH PROPOFOL ;  Surgeon: Charlanne Groom, MD;  Location: [facility] ENDOSCOPY;  Service: Gastroenterology;  Laterality: N/A;   DIAGNOSTIC LAPAROSCOPY  08/1999   dermoid cyst, RSO   DILATION AND CURETTAGE OF UTERUS  05/2004   MAB   DILATION AND CURETTAGE OF UTERUS  04/2015   ESOPHAGOGASTRODUODENOSCOPY (EGD) WITH PROPOFOL  N/A 03/13/2023   Procedure: ESOPHAGOGASTRODUODENOSCOPY (EGD) WITH PROPOFOL ;  Surgeon: Charlanne Groom, MD;  Location: [facility] ENDOSCOPY;  Service: Gastroenterology;  Laterality: N/A;   HYSTERECTOMY ABDOMINAL WITH SALPINGECTOMY Left 02/19/2024   Procedure: HYSTERECTOMY, TOTAL, ABDOMINAL, WITH SALPINGECTOMY;  Surgeon: Lequita Evalene LABOR, MD;  Location: [facility] OR;  Service: Gynecology;  Laterality: Left;   HYSTEROSCOPY WITH D & C  07/22/2011   Procedure: DILATATION AND CURETTAGE /HYSTEROSCOPY;  Surgeon: Rosaline LITTIE Cobble, MD;  Location: [facility] ORS;  Service: Gynecology;;   HYSTEROSCOPY WITH D & C N/A 01/15/2018   Procedure: DILATATION AND CURETTAGE /HYSTEROSCOPY WITH MYOSURE;  Surgeon: Leva Rush, MD;  Location: [facility] ORS;  Service: Gynecology;  Laterality: N/A;   oopherectomy  Right 2001   dermoid tumor   POLYPECTOMY  03/13/2023   Procedure: POLYPECTOMY;  Surgeon: Charlanne Groom, MD;  Location: [facility] ENDOSCOPY;  Service: Gastroenterology;;   TRANSTHORACIC ECHOCARDIOGRAM  08/2017    EF 65 to 70% with vigorous wall motion.  Suggestion of high cardiac output (possibly related to anemia thyrotoxicosis, Pregnancy, sepsis etc.) -- was in setting of symptomatic anemia - Hgb 5.7   UMBILICAL HERNIA REPAIR N/A 01/30/2023   Procedure: HERNIA REPAIR UMBILICAL ADULT WITH  MESH;  Surgeon: Vernetta Berg, MD;  Location: [facility] OR;  Service: General;  Laterality: N/A;     Outpatient Encounter Medications as of 08/16/2024  Medication Sig   acetaminophen  (TYLENOL ) 325 MG tablet Take 2 tablets (650 mg total) by mouth every 4 (four) hours as needed for mild pain (pain score 1-3) or fever (temperature > 101.5.).   amLODipine  (NORVASC ) 5 MG tablet Take 1 tablet (5 mg total) by mouth daily.   cetirizine (ZYRTEC) 10 MG tablet Take 10 mg by mouth daily as needed for allergies.   chlorthalidone  (HYGROTON ) 25 MG tablet TAKE 1/2 TABLET(12.5 MG) BY MOUTH DAILY   Cholecalciferol (VITAMIN D -3) 125 MCG (5000 UT) TABS Take 5,000 Units by mouth daily.   clonazePAM  (KLONOPIN ) 0.5 MG tablet Take 1 tablet (0.5 mg total) by mouth daily as needed for anxiety.   diclofenac (VOLTAREN) 75 MG EC tablet Take 75 mg by mouth 2 (two) times daily.   docusate sodium  (COLACE) 100 MG capsule Take 1 capsule (100 mg total) by mouth daily.   estradiol (VIVELLE-DOT) 0.1 MG/24HR patch APPLY 1 PATCH TOPICALLY TO THE SKIN 2 TIMES A WEEK   FLUoxetine  (PROZAC ) 10 MG capsule Take 10 mg by mouth daily. Take 10 mg daily for 7 days pre-cycle and 3 days during cycle (10 days per month)   ibuprofen  (ADVIL ) 600 MG tablet Take 1 tablet (600 mg total) by mouth every 6 (six) hours.   lamoTRIgine  (LAMICTAL ) 200 MG tablet Take 200 mg by mouth 2 (two) times daily.   lithium  carbonate (LITHOBID ) 300 MG ER tablet Take 2 tablets (600 mg total) by mouth 2 (two) times daily.   meloxicam  (MOBIC ) 7.5 MG tablet TAKE 1 TABLET BY MOUTH EVERY DAY   metFORMIN  (GLUCOPHAGE ) 500 MG tablet Take 1 tablet (500 mg total) by mouth 2 (two) times daily with a meal.   metoprolol  succinate (TOPROL -XL) 100 MG 24 hr tablet TAKE 1 TABLET BY MOUTH DAILY WITH OR IMMEDIATELY FOLLOWING A MEAL   predniSONE (STERAPRED UNI-PAK 48 TAB) 5 MG (48) TBPK tablet as directed Orally daily; Duration: 12 days   SEROQUEL  50 MG tablet Take 50 mg by mouth at bedtime.    triamcinolone  cream (KENALOG ) 0.1 % SMARTSIG:1 Application Topical 2-3 Times Daily   No facility-administered encounter medications on file as of 08/16/2024.     There were no vitals filed for this visit. There is no height or weight on file to calculate BMI.   ECOG PERFORMANCE STATUS: {CHL ONC ECOG PS:[phone]}  PHYSICAL EXAM GENERAL:alert, no distress and comfortable SKIN: no rash  EYES: sclera clear NECK: without mass LYMPH:  no palpable cervical or supraclavicular lymphadenopathy  LUNGS: clear with normal breathing effort HEART: regular rate & rhythm, no lower extremity edema ABDOMEN: abdomen soft, non-tender and normal  bowel sounds NEURO: alert & oriented x 3 with fluent speech, no focal motor/sensory deficits Breast exam:  PAC without erythema    CBC    Latest Ref Rng & Units 02/19/2024    6:16 PM 02/11/2024    2:32 PM 02/03/2024    7:43 AM  CBC  WBC 4.0 - 10.5 K/uL 15.6  8.1  6.8   Hemoglobin 12.0 - 15.0 g/dL 89.9  7.8  8.1   Hematocrit 36.0 - 46.0 % 33.5  28.3  28.1   Platelets 150 - 400 K/uL 259  385  414       CMP     Latest Ref Rng & Units 02/11/2024    2:32 PM 01/28/2024   12:42 PM 01/20/2024    9:13 AM  CMP  Glucose 70 - 99 mg/dL 93  91  89   BUN 6 - 20 mg/dL 9  17  7    Creatinine 0.44 - 1.00 mg/dL 9.02  8.87  9.10   Sodium 135 - 145 mmol/L 136  136  137   Potassium 3.5 - 5.1 mmol/L 3.7  3.9  4.1   Chloride 98 - 111 mmol/L 105  106  102   CO2 22 - 32 mmol/L 22  22  18    Calcium 8.9 - 10.3 mg/dL 9.7  9.4  9.7       ASSESSMENT & PLAN:Christine [name] is a pleasant 49 yo AAF with history of uterine fibroids, polyps, PCOS, obesity, HTN, and arthritis found to have severe iron  deficiency anemia secondary to chronic blood loss    1. Iron  deficiency anemia secondary to chronic blood loss from menorrhagia -She required frequent IV Feraheme  support in the past, from January- June 2019, then she lost to f/u -When she re-established in 02/2019, she presented with  symptomatic anemia with Hg 7.8, labs revealed severe iron  deficiency with ferritin <4, serum iron  18, 4% transferrin sat  -EGD/Colonoscopy by Dr. Charlanne 03/2023 showed colonic polyps, nonbleeding internal hemorrhoids, and mild gastritis -She has menorrhagia [place] from uterine fibroids, felt to be the source of IDA -She has received IV iron  periodically in the past (Feraheme  02/2019 - 04/2019, Venofer  12/2022), responds well but unfortunately lost follow-up after hemoglobin improved last year -    2.  Health and wellness -Previously under care of weight loss wellness center, PCP, cardiology, and Gyn.  She has new GYN and has reestablished with healthy weight management -Again reviewed age-appropriate cancer screenings this year.  Has never had a mammogram despite my recommendations.   -Colonoscopy done 03/2023 with Dr. Charlanne   3. Bipolar -On aripiprazole , clonazepam , lamotrigine , lithium  -She is under mental health care and on disability for bipolar but does work some  -Father-in-law passed away last year -Also has stress from 2 teenage daughters  -Mental state worsened 11/2022, she engaged in self-harm and is under the close care of mental health provider, she did not require hospitalization       PLAN:  No orders of the defined types were placed in this encounter.     All questions were answered. The patient knows to call the clinic with any problems, questions or concerns. No barriers to learning were detected. I spent *** counseling the patient face to face. The total time spent in the appointment was *** and more than 50% was on counseling, review of test results, and coordination of care.   Desmin Daleo K Tawanna Funk, NP 08/15/2024 8:58 AM  "

## 2024-08-16 ENCOUNTER — Encounter: Payer: Self-pay | Admitting: Nurse Practitioner

## 2024-08-16 ENCOUNTER — Inpatient Hospital Stay: Attending: Nurse Practitioner

## 2024-08-16 ENCOUNTER — Inpatient Hospital Stay: Admitting: Nurse Practitioner

## 2024-08-16 VITALS — BP 138/64 | HR 84 | Temp 97.8°F | Wt 334.6 lb

## 2024-08-16 DIAGNOSIS — D500 Iron deficiency anemia secondary to blood loss (chronic): Secondary | ICD-10-CM

## 2024-08-16 LAB — CBC WITH DIFFERENTIAL (CANCER CENTER ONLY)
Abs Immature Granulocytes: 0.12 K/uL — ABNORMAL HIGH (ref 0.00–0.07)
Basophils Absolute: 0 K/uL (ref 0.0–0.1)
Basophils Relative: 0 %
Eosinophils Absolute: 0.1 K/uL (ref 0.0–0.5)
Eosinophils Relative: 2 %
HCT: 36.7 % (ref 36.0–46.0)
Hemoglobin: 11.8 g/dL — ABNORMAL LOW (ref 12.0–15.0)
Immature Granulocytes: 1 %
Lymphocytes Relative: 17 %
Lymphs Abs: 1.5 K/uL (ref 0.7–4.0)
MCH: 30.7 pg (ref 26.0–34.0)
MCHC: 32.2 g/dL (ref 30.0–36.0)
MCV: 95.6 fL (ref 80.0–100.0)
Monocytes Absolute: 0.7 K/uL (ref 0.1–1.0)
Monocytes Relative: 8 %
Neutro Abs: 6.4 K/uL (ref 1.7–7.7)
Neutrophils Relative %: 72 %
Platelet Count: 278 K/uL (ref 150–400)
RBC: 3.84 MIL/uL — ABNORMAL LOW (ref 3.87–5.11)
RDW: 14.6 % (ref 11.5–15.5)
WBC Count: 8.9 K/uL (ref 4.0–10.5)
nRBC: 0 % (ref 0.0–0.2)

## 2024-08-16 LAB — IRON AND IRON BINDING CAPACITY (CC-WL,HP ONLY)
Iron: 69 ug/dL (ref 28–170)
Saturation Ratios: 19 % (ref 10.4–31.8)
TIBC: 356 ug/dL (ref 250–450)
UIBC: 287 ug/dL

## 2024-08-16 LAB — SAMPLE TO BLOOD BANK

## 2024-08-16 LAB — VITAMIN B12: Vitamin B-12: 391 pg/mL (ref 180–914)

## 2024-08-16 LAB — FERRITIN: Ferritin: 34 ng/mL (ref 11–307)

## 2024-09-09 ENCOUNTER — Other Ambulatory Visit: Payer: Self-pay | Admitting: Cardiology
